# Patient Record
Sex: Male | Born: 1945 | Race: Black or African American | Hispanic: No | Marital: Single | Smoking: Current every day smoker
Health system: Southern US, Community
[De-identification: ages and names within clinical notes are randomized; demographics above are authoritative.]

## PROBLEM LIST (undated history)

## (undated) DIAGNOSIS — E785 Hyperlipidemia, unspecified: Secondary | ICD-10-CM

## (undated) DIAGNOSIS — T4145XA Adverse effect of unspecified anesthetic, initial encounter: Secondary | ICD-10-CM

## (undated) DIAGNOSIS — M6281 Muscle weakness (generalized): Secondary | ICD-10-CM

## (undated) DIAGNOSIS — I4729 Other ventricular tachycardia: Secondary | ICD-10-CM

## (undated) DIAGNOSIS — G709 Myoneural disorder, unspecified: Secondary | ICD-10-CM

## (undated) DIAGNOSIS — I2510 Atherosclerotic heart disease of native coronary artery without angina pectoris: Secondary | ICD-10-CM

## (undated) DIAGNOSIS — I739 Peripheral vascular disease, unspecified: Secondary | ICD-10-CM

## (undated) DIAGNOSIS — I10 Essential (primary) hypertension: Secondary | ICD-10-CM

## (undated) DIAGNOSIS — R293 Abnormal posture: Secondary | ICD-10-CM

## (undated) DIAGNOSIS — J449 Chronic obstructive pulmonary disease, unspecified: Secondary | ICD-10-CM

## (undated) DIAGNOSIS — F329 Major depressive disorder, single episode, unspecified: Secondary | ICD-10-CM

## (undated) DIAGNOSIS — I469 Cardiac arrest, cause unspecified: Secondary | ICD-10-CM

## (undated) DIAGNOSIS — I509 Heart failure, unspecified: Secondary | ICD-10-CM

## (undated) DIAGNOSIS — F32A Depression, unspecified: Secondary | ICD-10-CM

## (undated) DIAGNOSIS — N189 Chronic kidney disease, unspecified: Secondary | ICD-10-CM

## (undated) DIAGNOSIS — I472 Ventricular tachycardia: Secondary | ICD-10-CM

## (undated) DIAGNOSIS — T8859XA Other complications of anesthesia, initial encounter: Secondary | ICD-10-CM

## (undated) DIAGNOSIS — I428 Other cardiomyopathies: Secondary | ICD-10-CM

## (undated) DIAGNOSIS — I5189 Other ill-defined heart diseases: Secondary | ICD-10-CM

## (undated) HISTORY — DX: Other ventricular tachycardia: I47.29

## (undated) HISTORY — DX: Hyperlipidemia, unspecified: E78.5

## (undated) HISTORY — DX: Ventricular tachycardia: I47.2

## (undated) HISTORY — DX: Peripheral vascular disease, unspecified: I73.9

## (undated) HISTORY — DX: Cardiac arrest, cause unspecified: I46.9

## (undated) HISTORY — PX: CARDIAC CATHETERIZATION: SHX172

## (undated) HISTORY — DX: Other cardiomyopathies: I42.8

## (undated) HISTORY — PX: OTHER SURGICAL HISTORY: SHX169

## (undated) HISTORY — PX: TOE AMPUTATION: SHX809

## (undated) HISTORY — DX: Atherosclerotic heart disease of native coronary artery without angina pectoris: I25.10

---
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
## 1997-11-19 ENCOUNTER — Emergency Department (HOSPITAL_COMMUNITY): Admission: EM | Admit: 1997-11-19 | Discharge: 1997-11-19 | Payer: Self-pay | Admitting: Emergency Medicine

## 2005-01-19 ENCOUNTER — Ambulatory Visit: Payer: Self-pay | Admitting: Family Medicine

## 2005-01-19 ENCOUNTER — Observation Stay (HOSPITAL_COMMUNITY): Admission: AD | Admit: 2005-01-19 | Discharge: 2005-01-20 | Payer: Self-pay | Admitting: Family Medicine

## 2005-01-28 ENCOUNTER — Ambulatory Visit: Payer: Self-pay | Admitting: Family Medicine

## 2005-03-06 ENCOUNTER — Ambulatory Visit (HOSPITAL_COMMUNITY): Admission: RE | Admit: 2005-03-06 | Discharge: 2005-03-06 | Payer: Self-pay | Admitting: Urology

## 2005-03-06 ENCOUNTER — Ambulatory Visit (HOSPITAL_BASED_OUTPATIENT_CLINIC_OR_DEPARTMENT_OTHER): Admission: RE | Admit: 2005-03-06 | Discharge: 2005-03-06 | Payer: Self-pay | Admitting: Urology

## 2005-03-27 ENCOUNTER — Ambulatory Visit (HOSPITAL_BASED_OUTPATIENT_CLINIC_OR_DEPARTMENT_OTHER): Admission: RE | Admit: 2005-03-27 | Discharge: 2005-03-27 | Payer: Self-pay | Admitting: Urology

## 2005-03-27 ENCOUNTER — Ambulatory Visit (HOSPITAL_COMMUNITY): Admission: RE | Admit: 2005-03-27 | Discharge: 2005-03-27 | Payer: Self-pay | Admitting: Urology

## 2006-05-27 DIAGNOSIS — F17200 Nicotine dependence, unspecified, uncomplicated: Secondary | ICD-10-CM

## 2011-05-07 ENCOUNTER — Emergency Department (HOSPITAL_COMMUNITY): Payer: Medicare Other

## 2011-05-07 ENCOUNTER — Other Ambulatory Visit: Payer: Self-pay

## 2011-05-07 ENCOUNTER — Inpatient Hospital Stay (HOSPITAL_COMMUNITY)
Admission: EM | Admit: 2011-05-07 | Discharge: 2011-05-14 | DRG: 286 | Disposition: A | Discharge status: Home / Self Care | Payer: Medicare Other | Source: Ambulatory Visit | Attending: Internal Medicine | Admitting: Internal Medicine

## 2011-05-07 ENCOUNTER — Encounter (HOSPITAL_COMMUNITY): Payer: Self-pay | Admitting: Radiology

## 2011-05-07 DIAGNOSIS — I5031 Acute diastolic (congestive) heart failure: Secondary | ICD-10-CM

## 2011-05-07 DIAGNOSIS — F17200 Nicotine dependence, unspecified, uncomplicated: Secondary | ICD-10-CM

## 2011-05-07 DIAGNOSIS — I428 Other cardiomyopathies: Secondary | ICD-10-CM

## 2011-05-07 DIAGNOSIS — E785 Hyperlipidemia, unspecified: Secondary | ICD-10-CM

## 2011-05-07 DIAGNOSIS — Z794 Long term (current) use of insulin: Secondary | ICD-10-CM

## 2011-05-07 DIAGNOSIS — Z7982 Long term (current) use of aspirin: Secondary | ICD-10-CM

## 2011-05-07 DIAGNOSIS — E118 Type 2 diabetes mellitus with unspecified complications: Secondary | ICD-10-CM | POA: Diagnosis present

## 2011-05-07 DIAGNOSIS — E669 Obesity, unspecified: Secondary | ICD-10-CM | POA: Diagnosis present

## 2011-05-07 DIAGNOSIS — E119 Type 2 diabetes mellitus without complications: Secondary | ICD-10-CM | POA: Diagnosis present

## 2011-05-07 DIAGNOSIS — Z79899 Other long term (current) drug therapy: Secondary | ICD-10-CM

## 2011-05-07 DIAGNOSIS — E876 Hypokalemia: Secondary | ICD-10-CM | POA: Diagnosis present

## 2011-05-07 DIAGNOSIS — J811 Chronic pulmonary edema: Secondary | ICD-10-CM

## 2011-05-07 DIAGNOSIS — E1169 Type 2 diabetes mellitus with other specified complication: Secondary | ICD-10-CM | POA: Diagnosis present

## 2011-05-07 DIAGNOSIS — R7402 Elevation of levels of lactic acid dehydrogenase (LDH): Secondary | ICD-10-CM | POA: Diagnosis present

## 2011-05-07 DIAGNOSIS — E1310 Other specified diabetes mellitus with ketoacidosis without coma: Secondary | ICD-10-CM | POA: Diagnosis present

## 2011-05-07 DIAGNOSIS — Z8249 Family history of ischemic heart disease and other diseases of the circulatory system: Secondary | ICD-10-CM

## 2011-05-07 DIAGNOSIS — I110 Hypertensive heart disease with heart failure: Principal | ICD-10-CM | POA: Diagnosis present

## 2011-05-07 DIAGNOSIS — I509 Heart failure, unspecified: Secondary | ICD-10-CM

## 2011-05-07 DIAGNOSIS — I161 Hypertensive emergency: Secondary | ICD-10-CM

## 2011-05-07 DIAGNOSIS — E7800 Pure hypercholesterolemia, unspecified: Secondary | ICD-10-CM | POA: Diagnosis present

## 2011-05-07 DIAGNOSIS — R7401 Elevation of levels of liver transaminase levels: Secondary | ICD-10-CM | POA: Diagnosis present

## 2011-05-07 DIAGNOSIS — I10 Essential (primary) hypertension: Secondary | ICD-10-CM | POA: Diagnosis present

## 2011-05-07 HISTORY — DX: Essential (primary) hypertension: I10

## 2011-05-07 HISTORY — DX: Heart failure, unspecified: I50.9

## 2011-05-07 LAB — CBC
HCT: 41.1 % (ref 39.0–52.0)
HCT: 38.9 % — ABNORMAL LOW (ref 39.0–52.0)
Hemoglobin: 13.4 g/dL (ref 13.0–17.0)
MCH: 29 pg (ref 26.0–34.0)
MCV: 84.4 fL (ref 78.0–100.0)
MCV: 84.2 fL (ref 78.0–100.0)
Platelets: 190 10*3/uL (ref 150–400)
RBC: 4.87 MIL/uL (ref 4.22–5.81)
RBC: 4.62 MIL/uL (ref 4.22–5.81)
RDW: 13.9 % (ref 11.5–15.5)
WBC: 13.7 10*3/uL — ABNORMAL HIGH (ref 4.0–10.5)
WBC: 10.8 10*3/uL — ABNORMAL HIGH (ref 4.0–10.5)

## 2011-05-07 LAB — URINALYSIS, ROUTINE W REFLEX MICROSCOPIC
Bilirubin Urine: NEGATIVE
Leukocytes, UA: NEGATIVE
Nitrite: NEGATIVE
Specific Gravity, Urine: 1.02 (ref 1.005–1.030)
Urobilinogen, UA: 0.2 mg/dL (ref 0.0–1.0)
pH: 5.5 (ref 5.0–8.0)

## 2011-05-07 LAB — D-DIMER, QUANTITATIVE: D-Dimer, Quant: 0.84 ug/mL-FEU — ABNORMAL HIGH (ref 0.00–0.48)

## 2011-05-07 LAB — CARDIAC PANEL(CRET KIN+CKTOT+MB+TROPI)
CK, MB: 5.8 ng/mL — ABNORMAL HIGH (ref 0.3–4.0)
Relative Index: 2.1 (ref 0.0–2.5)
Relative Index: 2.2 (ref 0.0–2.5)
Troponin I: <0.3 ng/mL (ref <0.30)

## 2011-05-07 LAB — COMPREHENSIVE METABOLIC PANEL
ALT: 69 U/L — ABNORMAL HIGH (ref 0–53)
AST: 143 U/L — ABNORMAL HIGH (ref 0–37)
Alkaline Phosphatase: 75 U/L (ref 39–117)
CO2: 20 mEq/L (ref 19–32)
Chloride: 98 mEq/L (ref 96–112)
GFR calc Af Amer: >90 mL/min (ref >90)
GFR calc non Af Amer: >90 mL/min (ref >90)
Glucose, Bld: 412 mg/dL — ABNORMAL HIGH (ref 70–99)
Sodium: 137 mEq/L (ref 135–145)
Total Bilirubin: 0.3 mg/dL (ref 0.3–1.2)

## 2011-05-07 LAB — URINE MICROSCOPIC-ADD ON

## 2011-05-07 LAB — LIPID PANEL
Cholesterol: 227 mg/dL — ABNORMAL HIGH (ref 0–200)
HDL: 32 mg/dL — ABNORMAL LOW (ref >39)
Total CHOL/HDL Ratio: 7.1 RATIO
Triglycerides: 176 mg/dL — ABNORMAL HIGH (ref <150)
VLDL: 35 mg/dL (ref 0–40)

## 2011-05-07 LAB — DIFFERENTIAL
Basophils Absolute: 0 10*3/uL (ref 0.0–0.1)
Eosinophils Relative: 0 % (ref 0–5)
Lymphocytes Relative: 17 % (ref 12–46)
Lymphs Abs: 2.3 10*3/uL (ref 0.7–4.0)
Neutro Abs: 10.7 10*3/uL — ABNORMAL HIGH (ref 1.7–7.7)

## 2011-05-07 LAB — PRO B NATRIURETIC PEPTIDE: Pro B Natriuretic peptide (BNP): 744.7 pg/mL — ABNORMAL HIGH (ref 0–125)

## 2011-05-07 LAB — CREATININE, SERUM: GFR calc Af Amer: >90 mL/min (ref >90)

## 2011-05-07 MED ORDER — INSULIN REGULAR BOLUS VIA INFUSION
0.0000 [IU] | Freq: Three times a day (TID) | INTRAVENOUS | Status: DC
Start: 2011-05-08 — End: 2011-05-08
  Filled 2011-05-07: qty 10

## 2011-05-07 MED ORDER — NICOTINE 21 MG/24HR TD PT24
21.0000 mg | MEDICATED_PATCH | TRANSDERMAL | Status: DC
  Administered 2011-05-07 – 2011-05-13 (×7): 21 mg via TRANSDERMAL
  Filled 2011-05-07 (×8): qty 1

## 2011-05-07 MED ORDER — ASPIRIN 325 MG PO TABS
325.0000 mg | ORAL_TABLET | Freq: Every day | ORAL | Status: DC
Start: 2011-05-08 — End: 2011-05-14
  Administered 2011-05-08 – 2011-05-14 (×7): 325 mg via ORAL
  Filled 2011-05-07 (×7): qty 1

## 2011-05-07 MED ORDER — LEVALBUTEROL HCL 0.63 MG/3ML IN NEBU
0.6300 mg | INHALATION_SOLUTION | Freq: Four times a day (QID) | RESPIRATORY_TRACT | Status: DC
  Filled 2011-05-07 (×3): qty 3

## 2011-05-07 MED ORDER — IOHEXOL 350 MG/ML SOLN
100.0000 mL | Freq: Once | INTRAVENOUS | Status: AC | PRN
  Administered 2011-05-07: 100 mL via INTRAVENOUS

## 2011-05-07 MED ORDER — FUROSEMIDE 10 MG/ML IJ SOLN
40.0000 mg | Freq: Once | INTRAMUSCULAR | Status: AC
  Administered 2011-05-07: 40 mg via INTRAVENOUS
  Filled 2011-05-07: qty 4

## 2011-05-07 MED ORDER — ONDANSETRON HCL 4 MG/2ML IJ SOLN: 4.0000 mg | Freq: Four times a day (QID) | INTRAMUSCULAR | Status: DC | PRN

## 2011-05-07 MED ORDER — LEVALBUTEROL HCL 0.63 MG/3ML IN NEBU
0.6300 mg | INHALATION_SOLUTION | Freq: Two times a day (BID) | RESPIRATORY_TRACT | Status: DC
  Administered 2011-05-08: 0.63 mg via RESPIRATORY_TRACT
  Filled 2011-05-07 (×2): qty 3

## 2011-05-07 MED ORDER — ASPIRIN 81 MG PO CHEW
324.0000 mg | CHEWABLE_TABLET | Freq: Once | ORAL | Status: AC
  Administered 2011-05-07: 324 mg via ORAL
  Filled 2011-05-07: qty 4

## 2011-05-07 MED ORDER — SODIUM CHLORIDE 0.9 % IV SOLN: 250.0000 mL | INTRAVENOUS | Status: DC | PRN

## 2011-05-07 MED ORDER — ENOXAPARIN SODIUM 40 MG/0.4ML ~~LOC~~ SOLN
40.0000 mg | SUBCUTANEOUS | Status: DC
  Administered 2011-05-07: 40 mg via SUBCUTANEOUS
  Filled 2011-05-07 (×2): qty 0.4

## 2011-05-07 MED ORDER — IPRATROPIUM BROMIDE 0.02 % IN SOLN
0.5000 mg | Freq: Four times a day (QID) | RESPIRATORY_TRACT | Status: DC | PRN
  Administered 2011-05-07: 0.5 mg via RESPIRATORY_TRACT
  Filled 2011-05-07: qty 2.5

## 2011-05-07 MED ORDER — IPRATROPIUM BROMIDE 0.02 % IN SOLN
0.5000 mg | Freq: Two times a day (BID) | RESPIRATORY_TRACT | Status: DC
  Administered 2011-05-08: 0.5 mg via RESPIRATORY_TRACT
  Filled 2011-05-07: qty 2.5

## 2011-05-07 MED ORDER — LISINOPRIL 5 MG PO TABS
5.0000 mg | ORAL_TABLET | Freq: Every day | ORAL | Status: DC
  Administered 2011-05-08: 5 mg via ORAL
  Filled 2011-05-07 (×2): qty 1

## 2011-05-07 MED ORDER — DEXTROSE 50 % IV SOLN: 25.0000 mL | INTRAVENOUS | Status: DC | PRN

## 2011-05-07 MED ORDER — NITROGLYCERIN 0.4 MG SL SUBL: 0.4000 mg | SUBLINGUAL_TABLET | SUBLINGUAL | Status: DC | PRN

## 2011-05-07 MED ORDER — MOXIFLOXACIN HCL IN NACL 400 MG/250ML IV SOLN
400.0000 mg | INTRAVENOUS | Status: DC
  Administered 2011-05-07 – 2011-05-08 (×2): 400 mg via INTRAVENOUS
  Filled 2011-05-07 (×2): qty 250

## 2011-05-07 MED ORDER — NITROGLYCERIN IN D5W 200-5 MCG/ML-% IV SOLN: 5.0000 ug/min | INTRAVENOUS | Status: DC

## 2011-05-07 MED ORDER — FUROSEMIDE 10 MG/ML IJ SOLN
40.0000 mg | Freq: Two times a day (BID) | INTRAMUSCULAR | Status: DC
  Administered 2011-05-08 (×2): 40 mg via INTRAVENOUS
  Filled 2011-05-07 (×3): qty 4

## 2011-05-07 MED ORDER — LEVALBUTEROL HCL 0.63 MG/3ML IN NEBU
0.6300 mg | INHALATION_SOLUTION | Freq: Four times a day (QID) | RESPIRATORY_TRACT | Status: DC | PRN
  Administered 2011-05-07: 0.63 mg via RESPIRATORY_TRACT
  Filled 2011-05-07: qty 3

## 2011-05-07 MED ORDER — SODIUM CHLORIDE 0.9 % IV SOLN
INTRAVENOUS | Status: AC
  Administered 2011-05-08: 3.1 [IU]/h via INTRAVENOUS
  Filled 2011-05-07: qty 1

## 2011-05-07 MED ORDER — SODIUM CHLORIDE 0.9 % IJ SOLN
3.0000 mL | Freq: Two times a day (BID) | INTRAMUSCULAR | Status: DC
  Administered 2011-05-07 – 2011-05-14 (×11): 3 mL via INTRAVENOUS

## 2011-05-07 MED ORDER — SODIUM CHLORIDE 0.9 % IV SOLN
INTRAVENOUS | Status: DC
  Administered 2011-05-07 – 2011-05-10 (×2): via INTRAVENOUS

## 2011-05-07 MED ORDER — CARVEDILOL 3.125 MG PO TABS
3.1250 mg | ORAL_TABLET | Freq: Two times a day (BID) | ORAL | Status: DC
  Administered 2011-05-08: 3.125 mg via ORAL
  Filled 2011-05-07 (×3): qty 1

## 2011-05-07 MED ORDER — NITROGLYCERIN IN D5W 200-5 MCG/ML-% IV SOLN
2.0000 ug/min | Freq: Once | INTRAVENOUS | Status: AC
  Administered 2011-05-07: 3 ug/min via INTRAVENOUS
  Filled 2011-05-07: qty 250

## 2011-05-07 MED ORDER — IPRATROPIUM BROMIDE 0.02 % IN SOLN: 0.5000 mg | Freq: Four times a day (QID) | RESPIRATORY_TRACT | Status: DC

## 2011-05-07 MED ORDER — DEXTROSE-NACL 5-0.45 % IV SOLN
INTRAVENOUS | Status: DC
  Administered 2011-05-08: 03:00:00 via INTRAVENOUS

## 2011-05-07 MED ORDER — ACETAMINOPHEN 325 MG PO TABS: 650.0000 mg | ORAL_TABLET | ORAL | Status: DC | PRN

## 2011-05-07 MED ORDER — POTASSIUM CHLORIDE CRYS ER 20 MEQ PO TBCR
40.0000 meq | EXTENDED_RELEASE_TABLET | Freq: Once | ORAL | Status: AC
  Administered 2011-05-07: 40 meq via ORAL
  Filled 2011-05-07: qty 2

## 2011-05-07 MED ORDER — SODIUM CHLORIDE 0.9 % IJ SOLN: 3.0000 mL | INTRAMUSCULAR | Status: DC | PRN

## 2011-05-07 NOTE — ED Notes (Signed)
Nitro started then stopped again fro lower bp.  Will start again and observe

## 2011-05-07 NOTE — ED Notes (Signed)
The pt has returned from xray no chest pain no sob.  C/o being hungry

## 2011-05-07 NOTE — ED Notes (Signed)
The pt has no chest pain no sob.  He has a cough that he says he has had for awhile

## 2011-05-07 NOTE — ED Provider Notes (Signed)
History     CSN: [id number]  Arrival date & time 05/07/11  1634   First MD Initiated Contact with Patient 05/07/11 1641      Chief Complaint  Patient presents with  . Shortness of Breath  . Tachycardia    (Consider location/radiation/quality/duration/timing/severity/associated sxs/prior treatment) HPI Comments: 66 yo male with sudden onset SOB while walking back from the bathroom.  Denies medical problems, has not seen a doctor in >5 years.  At onset, felt tightness across his abdomen.  None now.  Cold air helped.  O2 helps.  No treatment prior to arrival.    Patient is a 66 y.o. male presenting with shortness of breath. The history is provided by the patient.  Shortness of Breath  The current episode started today. The onset was sudden. The problem occurs continuously. The problem has been unchanged. The problem is severe. The symptoms are relieved by cold air and rest. The symptoms are aggravated by activity. Associated symptoms include cough and shortness of breath. Pertinent negatives include no chest pain and no fever.    No past medical history on file.  No past surgical history on file.  No family history on file.  History  Substance Use Topics  . Smoking status: Not on file  . Smokeless tobacco: Not on file  . Alcohol Use: Not on file      Review of Systems  Constitutional: Negative for fever.  HENT: Positive for congestion.   Respiratory: Positive for cough and shortness of breath.   Cardiovascular: Negative for chest pain.  Gastrointestinal: Negative for nausea, vomiting, abdominal pain and diarrhea.  Skin: Negative for rash.  All other systems reviewed and are negative.    Allergies  Review of patient's allergies indicates no known allergies.  Home Medications  No current outpatient prescriptions on file.  BP 207/126  Pulse 110  Temp(Src) 97.6 F (36.4 C) (Oral)  Resp 25  Ht 5\' 11"  (1.803 m)  Wt 260 lb (117.935 kg)  BMI 36.26 kg/m2  SpO2  94%  Physical Exam  Nursing note and vitals reviewed. Constitutional: He is oriented to person, place, and time. He appears well-developed and well-nourished. No distress.  HENT:  Head: Normocephalic and atraumatic.  Mouth/Throat: Oropharynx is clear and moist.  Eyes: Conjunctivae are normal. Pupils are equal, round, and reactive to light. No scleral icterus.  Neck: Normal range of motion. Neck supple.  Cardiovascular: Normal rate, regular rhythm, normal heart sounds and intact distal pulses.   No murmur heard. Pulmonary/Chest: No stridor.       Course breath sounds in all lung fields.  Able to talk in full sentences.  Tachypneic. Mild respiratory distress.    Abdominal: Soft. He exhibits no distension. There is no tenderness.  Musculoskeletal: Normal range of motion. He exhibits edema (mild lower extremity edema).  Neurological: He is alert and oriented to person, place, and time.  Skin: Skin is warm and dry. No rash noted.  Psychiatric: He has a normal mood and affect. His behavior is normal.    ED Course  Procedures (including critical care time)  Labs Reviewed  CBC - Abnormal; Notable for the following:    WBC 13.7 (*)    All other components within normal limits  DIFFERENTIAL - Abnormal; Notable for the following:    Neutrophils Relative 78 (*)    Neutro Abs 10.7 (*)    All other components within normal limits  COMPREHENSIVE METABOLIC PANEL - Abnormal; Notable for the following:    Potassium  3.4 (*)    Glucose, Bld 412 (*)    AST 143 (*)    ALT 69 (*)    All other components within normal limits  CARDIAC PANEL(CRET KIN+CKTOT+MB+TROPI) - Abnormal; Notable for the following:    Total CK 235 (*)    CK, MB 4.9 (*)    All other components within normal limits  D-DIMER, QUANTITATIVE - Abnormal; Notable for the following:    D-Dimer, Quant 0.84 (*)    All other components within normal limits  URINALYSIS, ROUTINE W REFLEX MICROSCOPIC - Abnormal; Notable for the following:     Glucose, UA >1000 (*)    Hgb urine dipstick TRACE (*)    Protein, ur 100 (*)    All other components within normal limits  PRO B NATRIURETIC PEPTIDE - Abnormal; Notable for the following:    Pro B Natriuretic peptide (BNP) 744.7 (*)    All other components within normal limits  URINE MICROSCOPIC-ADD ON - Abnormal; Notable for the following:    Casts HYALINE CASTS (*)    All other components within normal limits  CARDIAC PANEL(CRET KIN+CKTOT+MB+TROPI) - Abnormal; Notable for the following:    Total CK 262 (*)    CK, MB 5.8 (*)    All other components within normal limits  CBC - Abnormal; Notable for the following:    WBC 10.8 (*)    HCT 38.9 (*)    All other components within normal limits  CREATININE, SERUM - Abnormal; Notable for the following:    GFR calc non Af Amer 88 (*)    All other components within normal limits  LIPID PANEL - Abnormal; Notable for the following:    Cholesterol 227 (*)    Triglycerides 176 (*)    HDL 32 (*)    LDL Cholesterol 160 (*)    All other components within normal limits  GLUCOSE, CAPILLARY - Abnormal; Notable for the following:    Glucose-Capillary 372 (*)    All other components within normal limits  PROTIME-INR  MRSA PCR SCREENING  KETONES, URINE  CARDIAC PANEL(CRET KIN+CKTOT+MB+TROPI)  CARDIAC PANEL(CRET KIN+CKTOT+MB+TROPI)  BASIC METABOLIC PANEL  URINE RAPID DRUG SCREEN (HOSP PERFORMED)   Ct Angio Chest W/cm &/or Wo Cm  05/07/2011  *RADIOLOGY REPORT*  Clinical Data: 66 year old male with shortness of breath. Tachycardia.  CT ANGIOGRAPHY CHEST  Technique:  Multidetector CT imaging of the chest using the standard protocol during bolus administration of intravenous contrast. Multiplanar reconstructed images including MIPs were obtained and reviewed to evaluate the vascular anatomy.  Contrast: OMNIPAQUE IOHEXOL 350 MG/ML IV SOLN  Comparison: Chest radiographs 05/07/2011.  Findings: Adequate contrast bolus timing in the pulmonary arterial  tree.  Respiratory motion artifact in the lower lungs. No focal filling defect identified in the pulmonary arterial tree to suggest the presence of acute pulmonary embolism.  Atelectatic changes to the major airways.  Layering bilateral pleural effusions, small and greater on the right.  No pericardial effusion.  Pulmonary atelectasis.  Ground-glass opacity in the superior segment of the left lower lobe and the lateral aspect of the superior right lower lobes segment.  Nodular bronchovascular opacity in the lingula measuring 9 mm (image 49).  Negative visualized upper abdominal viscera.  Increased number of mediastinal nodes.  Abnormally enlarged right paratracheal node of the 15 mm in short axis.  Small hilar nodes may be increased in number.  Negative thoracic inlet.  No axillary lymphadenopathy.  No acute osseous abnormality identified.  IMPRESSION: 1. No evidence of  acute pulmonary embolus. 2.  Small bilateral pleural effusions with atelectasis and superimposed bilateral superior segment lower lobe patchy/ground- glass opacities which presumably represent infection. 3.  Nodular more solid indeterminate opacity in the lingula measuring 9 mm (series 5 image 49).  Recommend post-treatment follow-up chest CT once the acute pulmonary process has clinically resolved to document resolution. 4.  Mediastinal lymphadenopathy favor to be reactive.  Attention directed on follow-up.  Original Report Authenticated By: Harley Hallmark, M.D.   Dg Chest Portable 1 View  05/07/2011  *RADIOLOGY REPORT*  Clinical Data: Shortness of breath, weakness, tachycardia  PORTABLE CHEST - 1 VIEW  Comparison: None.  Findings: There is airspace disease primarily at the lung bases although there is some  indistinctness of the perihilar vasculature as well.  These findings may represent pneumonia, but pulmonary vascular congestion and mild edema cannot be excluded.  Follow-up two-view chest x-ray is recommended.  No definite effusion is seen.  The heart is mildly enlarged.  IMPRESSION: Airspace disease and some perihilar vascular prominence.  Consider pneumonia or possibly pulmonary vascular congestion and edema. Recommend two-view chest x-ray.  Original Report Authenticated By: Juline Patch, M.D.   All radiology studies independently viewed by me.       Date: 05/08/2011  Rate: 109  Rhythm: sinus tachycardia  QRS Axis: normal  Intervals: normal  ST/T Wave abnormalities: normal  Conduction Disutrbances:none  Narrative Interpretation: PVC present  Old EKG Reviewed: none available    1. Hypertensive emergency   2. Pulmonary edema       MDM  Sudden onset shortness of breath.   No chest pain.  Hypertensive on arrival and tachypneic.  Course breath sounds bilaterally.  Initial impression is pulmonary edema.  Nitro drip ordered.  Labs and CXR also ordered.    Imaging with evidence of pulmonary edema.  Nitro drip has improved blood pressure.  Lasix given.  Internal medicine consulted for admission.      Warnell Forester, MD 05/08/11 [phone]

## 2011-05-07 NOTE — ED Notes (Signed)
Admitting doctor here to see 

## 2011-05-07 NOTE — H&P (Signed)
History and Physical       Hospital Admission Note Date: 05/07/2011  Patient name: Miguel Harvey Medical record number: [id number] Date of birth: 12-06-1945 Age: 66 y.o. Gender: male PCP: Event organiser, MD, MD  Attending physician: Marlin Canary, DO   Chief Complaint:  Acute shortness of breath today  HPI: Patient is a 66 year old African American male with history of tobacco abuse, who had not seen a physician in last 6 years presented to [facility] emergency room with acute shortness of breath with sudden onset today. History was obtained from the patient who stated that he was in his usual state of health uptill today afternoon. He took a nap between 1:30 to 3:30 PM, when he woke up to use the bathroom, he was walking back when he felt very short of breath and tightness across his lower abdomen. He denied any chest pain, diaphoresis, dizziness or lightheadedness or any syncopal episode. He went outside the house and felt somewhat better with the cold air. Patient states that he has not seen a physician in the last 6 years and was not taking any medications either. The emergency room workup showed elevated blood pressure 207/126, chest x-ray with pulmonary edema, BNP of 744. Patient was also noted to have leukocytosis with WBC of 13.7, and blood glucose of 412 with anion gap of 19. Patient denies any known history of diabetes mellitus or CHF.  Review of Systems:  Constitutional: Denies fever, chills, diaphoresis, appetite change and fatigue.  HEENT: Denies photophobia, eye pain, redness, hearing loss, ear pain, congestion, sore throat, rhinorrhea, sneezing, mouth sores, trouble swallowing, neck pain, neck stiffness and tinnitus.   Respiratory:  see history of present illness Cardiovascular: See history of present illness  Gastrointestinal: Denies nausea, vomiting, abdominal pain, diarrhea, constipation, blood in stool and abdominal  distention.  Genitourinary: Denies dysuria, urgency, frequency, hematuria, flank pain and difficulty urinating.  Musculoskeletal: Denies myalgias, back pain, joint swelling, arthralgias and gait problem.  Skin: Denies pallor, rash and wound.  Neurological: Denies dizziness, seizures, syncope, weakness, light-headedness, numbness and headaches.  Hematological: Denies adenopathy. Easy bruising, personal or family bleeding history  Psychiatric/Behavioral: Denies suicidal ideation, mood changes, confusion, nervousness, sleep disturbance and agitation  Past Medical History: History reviewed. No pertinent past medical history. No past surgical history on file.  Medications: Prior to Admission medications   Not on File    Allergies:  No Known Allergies  Social History: Patient smokes one pack per day for last 50 years, drinks a beer now and then and denies any drug use.   Family History: No family history on file.  Physical Exam: Blood pressure 159/63, pulse 63, temperature 97.6 F (36.4 C), temperature source Oral, resp. rate 18, height 5\' 11"  (1.803 m), weight 117.935 kg (260 lb), SpO2 100.00%. General: Alert, awake, oriented x3, in no acute distress. HEENT: anicteric sclera, pink conjunctiva, pupils equal and reactive to light and accomodation Neck: supple, no masses or lymphadenopathy, no goiter, no bruits  Heart: Regular rate and rhythm, without murmurs, rubs or gallops. Lungs: Coarse breath sounds throughout Abdomen: Soft, nontender, nondistended, positive bowel sounds, no masses. Extremities: No clubbing, cyanosis or edema with positive pedal pulses. Neuro: Grossly intact, no focal neurological deficits, strength 5/5 upper and lower extremities bilaterally Psych: alert and oriented x 3, normal mood and affect Skin: no rashes or lesions, warm and dry   LABS on Admission:  Basic Metabolic Panel:  Lab 05/07/11 4098  NA 137  K 3.4*  CL 98  CO2 20  GLUCOSE 412*  BUN 10    CREATININE 0.77  CALCIUM 8.8  MG --  PHOS --   Liver Function Tests:  Lab 05/07/11 1656  AST 143*  ALT 69*  ALKPHOS 75  BILITOT 0.3  PROT 6.8  ALBUMIN 3.6     Lab 05/07/11 1656  WBC 13.7*  NEUTROABS 10.7*  HGB 14.1  HCT 41.1  MCV 84.4  PLT 190   Cardiac Enzymes:  Lab 05/07/11 1713  CKTOTAL 235*  CKMB 4.9*  CKMBINDEX --  TROPONINI <0.30    Radiological Exams on Admission: Ct Angio Chest W/cm &/or Wo Cm  05/07/2011  *RADIOLOGY REPORT*  Clinical Data: 66 year old male with shortness of breath. Tachycardia.  CT ANGIOGRAPHY CHEST  Technique:  Multidetector CT imaging of the chest using the standard protocol during bolus administration of intravenous contrast. Multiplanar reconstructed images including MIPs were obtained and reviewed to evaluate the vascular anatomy.  Contrast: OMNIPAQUE IOHEXOL 350 MG/ML IV SOLN  Comparison: Chest radiographs 05/07/2011.  Findings: Adequate contrast bolus timing in the pulmonary arterial tree.  Respiratory motion artifact in the lower lungs. No focal filling defect identified in the pulmonary arterial tree to suggest the presence of acute pulmonary embolism.  Atelectatic changes to the major airways.  Layering bilateral pleural effusions, small and greater on the right.  No pericardial effusion.  Pulmonary atelectasis.  Ground-glass opacity in the superior segment of the left lower lobe and the lateral aspect of the superior right lower lobes segment.  Nodular bronchovascular opacity in the lingula measuring 9 mm (image 49).  Negative visualized upper abdominal viscera.  Increased number of mediastinal nodes.  Abnormally enlarged right paratracheal node of the 15 mm in short axis.  Small hilar nodes may be increased in number.  Negative thoracic inlet.  No axillary lymphadenopathy.  No acute osseous abnormality identified.  IMPRESSION: 1. No evidence of acute pulmonary embolus. 2.  Small bilateral pleural effusions with atelectasis and  superimposed bilateral superior segment lower lobe patchy/ground- glass opacities which presumably represent infection. 3.  Nodular more solid indeterminate opacity in the lingula measuring 9 mm (series 5 image 49).  Recommend post-treatment follow-up chest CT once the acute pulmonary process has clinically resolved to document resolution. 4.  Mediastinal lymphadenopathy favor to be reactive.  Attention directed on follow-up.  Original Report Authenticated By: Harley Hallmark, M.D.   Dg Chest Portable 1 View  05/07/2011  *RADIOLOGY REPORT*  Clinical Data: Shortness of breath, weakness, tachycardia  PORTABLE CHEST - 1 VIEW  Comparison: None.  Findings: There is airspace disease primarily at the lung bases although there is some  indistinctness of the perihilar vasculature as well.  These findings may represent pneumonia, but pulmonary vascular congestion and mild edema cannot be excluded.  Follow-up two-view chest x-ray is recommended.  No definite effusion is seen. The heart is mildly enlarged.  IMPRESSION: Airspace disease and some perihilar vascular prominence.  Consider pneumonia or possibly pulmonary vascular congestion and edema. Recommend two-view chest x-ray.  Original Report Authenticated By: Juline Patch, M.D.    Assessment/Plan  Principal problem: .Pulmonary edema: Differential diagnosis includes acute flash pulmonary edema due to malignant hypertension causing the acute CHF, acute bronchitis with atypical pneumonia. CT angiogram of the chest was done which ruled out pulmonary embolism - Patient will be admitted to step down unit overnight, given he is started on nitroglycerin drip for malignant hypertension and acute CHF, continue Lasix IV - Obtain serial cardiac enzymes, TSH, IV Lasix,  started on aspirin, Coreg, ACEI, wean nitro drip to off - Obtain lipid panel, 2-D echocardiogram for further workup. Obtain urine drug screen. - Patient does have coarse breath sounds on lung exam, start on  Avelox, Xopenex/Atrovent nebulizer treatments. Will order 2 view chest x-ray for tomorrow AM  Active problems:  .Diabetes mellitus: New onset  - Obtain HbA1c, anion gap 19, obtain urine ketones to assess for DKA, I will start the patient on glucose stabilizer for now. Patient will likely need diabetic teaching and education. Given patient has acute pulmonary edema with elevated BNP, hold off on IV fluids. - Patient will need to have a PCP and case manager assistance with medications.  Marland KitchenHTN (hypertension), malignant - Continue nitro drip, wean to off when SBP<140's/DBP<100's, I started the patient on Coreg, ACEI, aspirin   .Tobacco abuse: - Strongly counseled against nicotine use, I started him on nicotine patches   DVT prophylaxis: Lovenox  CODE STATUS: Full CODE STATUS  Further plan will depend as patient's clinical course evolves and further radiologic and laboratory data become available.   @Time  Spent on Admission: 1 hour Rumaisa Schnetzer M.D. [facility] 05/07/2011, 10:09 PM

## 2011-05-07 NOTE — ED Notes (Signed)
Per [facility], pt c/o sob since 1530 today while ambulating in his home.  Lasted 30 minutes before calling EMS.  Started with sob and tightness around abdomen.  No prior hx of same.  Denies any pain at this time.

## 2011-05-07 NOTE — ED Notes (Signed)
No chest pain no sob. Lasix 40mg  given iv

## 2011-05-08 ENCOUNTER — Encounter (HOSPITAL_COMMUNITY): Payer: Self-pay | Admitting: *Deleted

## 2011-05-08 ENCOUNTER — Other Ambulatory Visit: Payer: Self-pay

## 2011-05-08 ENCOUNTER — Inpatient Hospital Stay (HOSPITAL_COMMUNITY): Payer: Medicare Other

## 2011-05-08 DIAGNOSIS — I369 Nonrheumatic tricuspid valve disorder, unspecified: Secondary | ICD-10-CM

## 2011-05-08 LAB — CARDIAC PANEL(CRET KIN+CKTOT+MB+TROPI)
CK, MB: 5.9 ng/mL — ABNORMAL HIGH (ref 0.3–4.0)
CK, MB: 4.7 ng/mL — ABNORMAL HIGH (ref 0.3–4.0)
Relative Index: 1.6 (ref 0.0–2.5)
Total CK: 314 U/L — ABNORMAL HIGH (ref 7–232)
Total CK: 27 U/L (ref 7–232)
Troponin I: <0.3 ng/mL (ref <0.30)
Troponin I: <0.3 ng/mL (ref <0.30)

## 2011-05-08 LAB — BASIC METABOLIC PANEL
CO2: 25 mEq/L (ref 19–32)
Calcium: 8.8 mg/dL (ref 8.4–10.5)
Chloride: 104 mEq/L (ref 96–112)
Glucose, Bld: 124 mg/dL — ABNORMAL HIGH (ref 70–99)
Potassium: 3.4 mEq/L — ABNORMAL LOW (ref 3.5–5.1)
Sodium: 141 mEq/L (ref 135–145)

## 2011-05-08 LAB — RAPID URINE DRUG SCREEN, HOSP PERFORMED
Barbiturates: NOT DETECTED
Tetrahydrocannabinol: NOT DETECTED

## 2011-05-08 LAB — GLUCOSE, CAPILLARY
Glucose-Capillary: 372 mg/dL — ABNORMAL HIGH (ref 70–99)
Glucose-Capillary: 306 mg/dL — ABNORMAL HIGH (ref 70–99)
Glucose-Capillary: 163 mg/dL — ABNORMAL HIGH (ref 70–99)
Glucose-Capillary: 119 mg/dL — ABNORMAL HIGH (ref 70–99)
Glucose-Capillary: 133 mg/dL — ABNORMAL HIGH (ref 70–99)
Glucose-Capillary: 213 mg/dL — ABNORMAL HIGH (ref 70–99)
Glucose-Capillary: 160 mg/dL — ABNORMAL HIGH (ref 70–99)
Glucose-Capillary: 269 mg/dL — ABNORMAL HIGH (ref 70–99)
Glucose-Capillary: 241 mg/dL — ABNORMAL HIGH (ref 70–99)
Glucose-Capillary: 117 mg/dL — ABNORMAL HIGH (ref 70–99)

## 2011-05-08 MED ORDER — HEPARIN BOLUS VIA INFUSION
3000.0000 [IU] | Freq: Once | INTRAVENOUS | Status: AC
  Administered 2011-05-08: 3000 [IU] via INTRAVENOUS

## 2011-05-08 MED ORDER — NITROGLYCERIN IN D5W 200-5 MCG/ML-% IV SOLN
5.0000 ug/min | INTRAVENOUS | Status: DC
  Administered 2011-05-08: 5 ug/min via INTRAVENOUS
  Filled 2011-05-08: qty 250

## 2011-05-08 MED ORDER — HEPARIN SOD (PORCINE) IN D5W 100 UNIT/ML IV SOLN
1500.0000 [IU]/h | INTRAVENOUS | Status: DC
Start: 2011-05-08 — End: 2011-05-08
  Administered 2011-05-08: 1500 [IU]/h via INTRAVENOUS
  Filled 2011-05-08 (×3): qty 250

## 2011-05-08 MED ORDER — HEPARIN SOD (PORCINE) IN D5W 100 UNIT/ML IV SOLN
1850.0000 [IU]/h | INTRAVENOUS | Status: DC
  Administered 2011-05-08 – 2011-05-10 (×4): 1850 [IU]/h via INTRAVENOUS
  Filled 2011-05-08 (×5): qty 250

## 2011-05-08 MED ORDER — ENOXAPARIN SODIUM 80 MG/0.8ML ~~LOC~~ SOLN: 1.0000 mg/kg | Freq: Two times a day (BID) | SUBCUTANEOUS | Status: DC

## 2011-05-08 MED ORDER — ENOXAPARIN SODIUM 120 MG/0.8ML ~~LOC~~ SOLN
120.0000 mg | Freq: Two times a day (BID) | SUBCUTANEOUS | Status: DC
  Filled 2011-05-08 (×2): qty 0.8

## 2011-05-08 MED ORDER — LIVING WELL WITH DIABETES BOOK
Freq: Once | Status: AC
  Administered 2011-05-08: 12:00:00
  Filled 2011-05-08: qty 1

## 2011-05-08 MED ORDER — CARVEDILOL 6.25 MG PO TABS
6.2500 mg | ORAL_TABLET | Freq: Two times a day (BID) | ORAL | Status: DC
  Administered 2011-05-08: 6.25 mg via ORAL
  Filled 2011-05-08 (×4): qty 1

## 2011-05-08 MED ORDER — INSULIN ASPART 100 UNIT/ML ~~LOC~~ SOLN
0.0000 [IU] | Freq: Three times a day (TID) | SUBCUTANEOUS | Status: DC
  Administered 2011-05-08 – 2011-05-09 (×2): 5 [IU] via SUBCUTANEOUS
  Administered 2011-05-09: 2 [IU] via SUBCUTANEOUS
  Administered 2011-05-09 – 2011-05-10 (×3): 8 [IU] via SUBCUTANEOUS
  Administered 2011-05-10: 5 [IU] via SUBCUTANEOUS
  Administered 2011-05-10: 3 [IU] via SUBCUTANEOUS
  Administered 2011-05-11: 5 [IU] via SUBCUTANEOUS
  Administered 2011-05-11: 3 [IU] via SUBCUTANEOUS
  Administered 2011-05-12: 5 [IU] via SUBCUTANEOUS
  Administered 2011-05-12 (×2): 3 [IU] via SUBCUTANEOUS
  Administered 2011-05-13: 2 [IU] via SUBCUTANEOUS
  Administered 2011-05-14: 3 [IU] via SUBCUTANEOUS
  Administered 2011-05-14: 2 [IU] via SUBCUTANEOUS
  Filled 2011-05-08: qty 3

## 2011-05-08 MED ORDER — PARICALCITOL 5 MCG/ML IV SOLN
INTRAVENOUS | Status: AC
  Filled 2011-05-08: qty 1

## 2011-05-08 MED ORDER — DARBEPOETIN ALFA-POLYSORBATE 40 MCG/0.4ML IJ SOLN
INTRAMUSCULAR | Status: AC
  Filled 2011-05-08: qty 0.4

## 2011-05-08 MED ORDER — INSULIN GLARGINE 100 UNIT/ML ~~LOC~~ SOLN
14.0000 [IU] | SUBCUTANEOUS | Status: AC
  Administered 2011-05-08: 14 [IU] via SUBCUTANEOUS
  Filled 2011-05-08: qty 3

## 2011-05-08 MED ORDER — CARVEDILOL 6.25 MG PO TABS
6.2500 mg | ORAL_TABLET | Freq: Two times a day (BID) | ORAL | Status: DC
  Filled 2011-05-08 (×2): qty 1

## 2011-05-08 MED ORDER — INSULIN ASPART 100 UNIT/ML ~~LOC~~ SOLN
3.0000 [IU] | Freq: Three times a day (TID) | SUBCUTANEOUS | Status: DC
  Administered 2011-05-08 – 2011-05-09 (×3): 3 [IU] via SUBCUTANEOUS

## 2011-05-08 MED ORDER — INSULIN GLARGINE 100 UNIT/ML ~~LOC~~ SOLN: 15.0000 [IU] | Freq: Every day | SUBCUTANEOUS | Status: DC

## 2011-05-08 MED ORDER — INSULIN ASPART 100 UNIT/ML ~~LOC~~ SOLN
0.0000 [IU] | Freq: Every day | SUBCUTANEOUS | Status: DC
  Administered 2011-05-08 – 2011-05-10 (×2): 2 [IU] via SUBCUTANEOUS

## 2011-05-08 MED ORDER — FUROSEMIDE 20 MG PO TABS
20.0000 mg | ORAL_TABLET | Freq: Two times a day (BID) | ORAL | Status: DC
  Administered 2011-05-08 – 2011-05-11 (×6): 20 mg via ORAL
  Filled 2011-05-08 (×8): qty 1

## 2011-05-08 NOTE — Progress Notes (Signed)
Physical Therapy Evaluation Patient Details Name: Miguel Harvey MRN: [id number] DOB: [birth year] Today's Date: 05/08/2011  Problem List:  Patient Active Problem List  Diagnoses  . TOBACCO DEPENDENCE  . Diabetes mellitus  . Pulmonary edema  . Pneumonia  . HTN (hypertension), malignant  . Tobacco abuse  . CHF, acute    Past Medical History:  Past Medical History  Diagnosis Date  . Hypertension   . Diabetes mellitus   . CHF (congestive heart failure)    Past Surgical History:  Past Surgical History  Procedure Date  . Cyst removal from  right leg about 6 years ago     PT Assessment/Plan/Recommendation PT Assessment Clinical Impression Statement: pt is a 66 y/o male admitted with severe SOB due to ? flash pulm edema, bronchitis.  Pt much improved on eval, but still with ongoing decr. activity tolerance.  Pt can benefit from acute PT for education and activity to improve conditioning.  Likely no follow up. PT Recommendation/Assessment: Patient will need skilled PT in the acute care venue PT Problem List: Decreased activity tolerance;Cardiopulmonary status limiting activity PT Therapy Diagnosis : Other (comment) (decr activity tolerance) PT Plan PT Frequency: Min 3X/week PT Treatment/Interventions: Functional mobility training;Therapeutic activities;Gait training;Patient/family education PT Recommendation Follow Up Recommendations: No PT follow up Equipment Recommended: None recommended by PT PT Goals  Acute Rehab PT Goals PT Goal Formulation: With patient Time For Goal Achievement: 7 days Pt will Ambulate: >150 feet;Independently;Other (comment) (with no dyspnea.) PT Goal: Ambulate - Progress: Goal set today Pt will Go Up / Down Stairs: 3-5 stairs;with modified independence  PT Evaluation Precautions/Restrictions    Prior Functioning  Home Living Lives With: Alone Type of Home: House Home Layout: One level;Able to live on main level with bedroom/bathroom Home Access:  Stairs to enter Entrance Stairs-Rails: Right;Left Entrance Stairs-Number of Steps: 4 Bathroom Shower/Tub: Engineer, manufacturing systems: Standard Bathroom Accessibility: Yes Home Adaptive Equipment: None Prior Function Level of Independence: Independent with basic ADLs;Independent with homemaking with ambulation;Independent with gait;Independent with transfers Able to Take Stairs?: Yes Driving: Yes Cognition Cognition Arousal/Alertness: Awake/alert Overall Cognitive Status: Appears within functional limits for tasks assessed Sensation/Coordination Sensation Light Touch: Appears Intact Coordination Gross Motor Movements are Fluid and Coordinated: Yes Fine Motor Movements are Fluid and Coordinated: Not tested Extremity Assessment RUE Assessment RUE Assessment: Within Functional Limits LUE Assessment LUE Assessment: Within Functional Limits RLE Assessment RLE Assessment: Within Functional Limits LLE Assessment LLE Assessment: Within Functional Limits Mobility (including Balance) Bed Mobility Bed Mobility: Yes Supine to Sit: 7: Independent Sitting - Scoot to Edge of Bed: 7: Independent Transfers Transfers: Yes Sit to Stand: 7: Independent Stand to Sit: 7: Independent Ambulation/Gait Ambulation/Gait: Yes Ambulation/Gait Assistance: 7: Independent Ambulation Distance (Feet): 350 Feet Assistive device: None Gait Pattern: Within Functional Limits  Posture/Postural Control Posture/Postural Control: No significant limitations Balance Balance Assessed:  (wfl) Exercise    End of Session PT - End of Session Activity Tolerance: Patient tolerated treatment well;Other (comment) (mild dyspnea) Patient left: in chair;with call bell in reach Nurse Communication: Mobility status for transfers;Mobility status for ambulation General Behavior During Session: [facility] for tasks performed Cognition: [facility] for tasks performed  Lorelee Mclaurin, Eliseo Gum 05/08/2011, 10:54 AM  05/08/2011  [place] Bing, PT [phone] (pager)

## 2011-05-08 NOTE — Progress Notes (Signed)
ANTICOAGULATION CONSULT NOTE - Initial Consult  Pharmacy Consult for heparin Indication: ACS/STEMI  No Known Allergies  Patient Measurements: Height: 5\' 11"  (180.3 cm) Weight: 262 lb 12.6 oz (119.2 kg) IBW/kg (Calculated) : 75.3  Heparin Dosing Weight: 100kg  Vital Signs: Temp: 97.4 F (36.3 C) (02/08 1155) Temp src: Oral (02/08 1155) BP: 141/65 mmHg (02/08 1200) Pulse Rate: 79  (02/08 1155)  Labs:  Basename 05/08/11 1035 05/08/11 0415 05/07/11 2233 05/07/11 1656  HGB -- -- 13.4 14.1  HCT -- -- 38.9* 41.1  PLT -- -- 207 190  APTT -- -- -- --  LABPROT -- -- -- 13.3  INR -- -- -- 0.99  HEPARINUNFRC -- -- -- --  CREATININE -- 0.80 0.86 0.77  CKTOTAL 27 314* 262* --  CKMB 7.0* 5.9* 5.8* --  TROPONINI 4.08* <0.30 <0.30 --   Estimated Creatinine Clearance: 119.4 ml/min (by C-G formula based on Cr of 0.8).  Medical History: Past Medical History  Diagnosis Date  . Hypertension   . Diabetes mellitus   . CHF (congestive heart failure)     Medications:  Scheduled:    . aspirin  324 mg Oral Once  . aspirin  325 mg Oral Daily  . carvedilol  6.25 mg Oral BID WC  . furosemide  40 mg Intravenous Once  . furosemide  40 mg Intravenous Q12H  . insulin aspart  0-15 Units Subcutaneous TID WC  . insulin aspart  0-5 Units Subcutaneous QHS  . insulin aspart  3 Units Subcutaneous TID WC  . insulin glargine  14 Units Subcutaneous NOW  . insulin glargine  15 Units Subcutaneous QHS  . lisinopril  5 mg Oral Daily  . living well with diabetes book   Does not apply Once  . moxifloxacin  400 mg Intravenous Q24H  . nicotine  21 mg Transdermal Q24H  . nitroGLYCERIN  2-200 mcg/min Intravenous Once  . potassium chloride  40 mEq Oral Once  . sodium chloride  3 mL Intravenous Q12H  . DISCONTD: carvedilol  3.125 mg Oral BID WC  . DISCONTD: enoxaparin  1 mg/kg Subcutaneous Q12H  . DISCONTD: enoxaparin (LOVENOX) injection  120 mg Subcutaneous Q12H  . DISCONTD: enoxaparin  40 mg  Subcutaneous Q24H  . DISCONTD: insulin regular  0-10 Units Intravenous TID WC  . DISCONTD: ipratropium  0.5 mg Nebulization Q6H  . DISCONTD: ipratropium  0.5 mg Nebulization BID  . DISCONTD: levalbuterol  0.63 mg Nebulization Q6H  . DISCONTD: levalbuterol  0.63 mg Nebulization BID   Infusions:    . sodium chloride 10 mL/hr at 05/08/11 0800  . insulin (NOVOLIN-R) infusion 1.7 Units/hr (05/08/11 0800)  . nitroGLYCERIN 5 mcg/min (05/08/11 1222)  . DISCONTD: dextrose 5 % and 0.45% NaCl 20 mL/hr at 05/08/11 0800  . DISCONTD: nitroGLYCERIN 15 mcg/min (05/08/11 0910)    Assessment: 66 yo male with ACS/STEMI will be started on heparin therapy.  CBC ok. Got lovenox 40mg  sq last night at 2300. Goal of Therapy:  Heparin level 0.3-0.7 units/ml   Plan:  1) Heparin 1500 units/hr (=82ml/hr). No bolus.  2) Check a 6h heprain level after drip is started. 3) Daily heparin level and CBC  Natavia Sublette, Tsz-Yin 05/08/2011,12:57 PM

## 2011-05-08 NOTE — ED Provider Notes (Signed)
I saw and evaluated the patient, reviewed the resident's note and I agree with the findings and plan.  Acute onset SOB with "tightness" in lower abdomen.  No chest pain.  Hypertensive and tachypneic with rales.  Suspect pulmonary edema from CHF, hypertensive urgency.  Also hyperglycemia, r/o DKA.  NO history of diabetes.  CRITICAL CARE Performed by: Glynn Octave   Total critical care time: 30  Critical care time was exclusive of separately billable procedures and treating other patients.  Critical care was necessary to treat or prevent imminent or life-threatening deterioration.  Critical care was time spent personally by me on the following activities: development of treatment plan with patient and/or surrogate as well as nursing, discussions with consultants, evaluation of patient's response to treatment, examination of patient, obtaining history from patient or surrogate, ordering and performing treatments and interventions, ordering and review of laboratory studies, ordering and review of radiographic studies, pulse oximetry and re-evaluation of patient's condition.   Glynn Octave, MD 05/08/11 1122

## 2011-05-08 NOTE — Progress Notes (Signed)
Inpatient Diabetes Program Recommendations  AACE/ADA: New Consensus Statement on Inpatient Glycemic Control (2009)  Target Ranges:  Prepandial:   less than 140 mg/dL      Peak postprandial:   less than 180 mg/dL (1-2 hours)      Critically ill patients:  140 - 180 mg/dL   New-onset DM   Inpatient Diabetes Program Recommendations HgbA1C: A1C pending results Outpatient Referral: MD: patient needs order for outpatient education   Patient education orders have been entered for bedside RN to begin basic patient education using the DM videos on the patient education channel and also the 'Living Well with Diabetes' patient education workbook from the pharmacy floorstock.

## 2011-05-08 NOTE — Consult Note (Signed)
Reason for Consult: NSTEMI Referring Physician: Kavion Mancinas is an 66 y.o. male.  HPI:the patient is a 66 year old African American male who is obese and presents with untreated hypertension diabetes mellitus. He has a 70-pack-year smoking history. He has not seen a physician in 6-7 years. Has a brother who is deceased from MI at age 55.  Patient presented with shortness of breath. He stated it started yesterday with severe enough that he required EMS attention.  He denies any chest pain or discomfort, no radiation to arm neck or back, no diaphoresis no claudication symptoms no orthopnea or paroxysmal nocturnal dyspnea in the last week. He states he's not reactive he occasionally will walk on a neighbors house and back otherwise he is retired and spends most of his time in the couch watching television.  Past Medical History  Diagnosis Date  . Hypertension   . Diabetes mellitus   . CHF (congestive heart failure)     Past Surgical History  Procedure Date  . Cyst removal from  right leg about 6 years ago     Family history Mother: Decease age 75, father deceased age 74, brother deceased age 2 from myocardial infarction.  Social History:  reports that he has been smoking Cigarettes.  He has a 78 pack-year smoking history. He quit smokeless tobacco use yesterday. He reports that he does not use illicit drugs. Patient drinks approximately 2 beers every other day.  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  Allergies: No Known Allergies  Medications: patient takes no medications at home.  Current inpatient medications:    . aspirin  324 mg Oral Once  . aspirin  325 mg Oral Daily  . carvedilol  6.25 mg Oral BID WC  . furosemide  40 mg Intravenous Once  . furosemide  40 mg Intravenous Q12H  . insulin aspart  0-15 Units Subcutaneous TID WC  . insulin aspart  0-5 Units Subcutaneous QHS  . insulin aspart  3 Units Subcutaneous TID WC  .  insulin glargine  14 Units Subcutaneous NOW  . insulin glargine  15 Units Subcutaneous QHS  . lisinopril  5 mg Oral Daily  . living well with diabetes book   Does not apply Once  . moxifloxacin  400 mg Intravenous Q24H  . nicotine  21 mg Transdermal Q24H  . nitroGLYCERIN  2-200 mcg/min Intravenous Once  . potassium chloride  40 mEq Oral Once  . sodium chloride  3 mL Intravenous Q12H  . DISCONTD: carvedilol  3.125 mg Oral BID WC  . DISCONTD: enoxaparin  1 mg/kg Subcutaneous Q12H  . DISCONTD: enoxaparin (LOVENOX) injection  120 mg Subcutaneous Q12H  . DISCONTD: enoxaparin  40 mg Subcutaneous Q24H  . DISCONTD: insulin regular  0-10 Units Intravenous TID WC  . DISCONTD: ipratropium  0.5 mg Nebulization Q6H  . DISCONTD: ipratropium  0.5 mg Nebulization BID  . DISCONTD: levalbuterol  0.63 mg Nebulization Q6H  . DISCONTD: levalbuterol  0.63 mg Nebulization BID    Results for orders placed during the hospital encounter of 05/07/11 (from the past 48 hour(s))  CBC     Status: Abnormal   Collection Time   05/07/11  4:56 PM      Component Value Range Comment   WBC 13.7 (*) 4.0 - 10.5 (K/uL)    RBC 4.87  4.22 - 5.81 (MIL/uL)    Hemoglobin 14.1  13.0 - 17.0 (g/dL)    HCT 16.1  09.6 -  52.0 (%)    MCV 84.4  78.0 - 100.0 (fL)    MCH 29.0  26.0 - 34.0 (pg)    MCHC 34.3  30.0 - 36.0 (g/dL)    RDW 16.1  09.6 - 04.5 (%)    Platelets 190  150 - 400 (K/uL)   DIFFERENTIAL     Status: Abnormal   Collection Time   05/07/11  4:56 PM      Component Value Range Comment   Neutrophils Relative 78 (*) 43 - 77 (%)    Neutro Abs 10.7 (*) 1.7 - 7.7 (K/uL)    Lymphocytes Relative 17  12 - 46 (%)    Lymphs Abs 2.3  0.7 - 4.0 (K/uL)    Monocytes Relative 5  3 - 12 (%)    Monocytes Absolute 0.6  0.1 - 1.0 (K/uL)    Eosinophils Relative 0  0 - 5 (%)    Eosinophils Absolute 0.1  0.0 - 0.7 (K/uL)    Basophils Relative 0  0 - 1 (%)    Basophils Absolute 0.0  0.0 - 0.1 (K/uL)   COMPREHENSIVE METABOLIC PANEL      Status: Abnormal   Collection Time   05/07/11  4:56 PM      Component Value Range Comment   Sodium 137  135 - 145 (mEq/L)    Potassium 3.4 (*) 3.5 - 5.1 (mEq/L)    Chloride 98  96 - 112 (mEq/L)    CO2 20  19 - 32 (mEq/L)    Glucose, Bld 412 (*) 70 - 99 (mg/dL)    BUN 10  6 - 23 (mg/dL)    Creatinine, Ser 4.09  0.50 - 1.35 (mg/dL)    Calcium 8.8  8.4 - 10.5 (mg/dL)    Total Protein 6.8  6.0 - 8.3 (g/dL)    Albumin 3.6  3.5 - 5.2 (g/dL)    AST 811 (*) 0 - 37 (U/L)    ALT 69 (*) 0 - 53 (U/L)    Alkaline Phosphatase 75  39 - 117 (U/L)    Total Bilirubin 0.3  0.3 - 1.2 (mg/dL)    GFR calc non Af Amer >90  >90 (mL/min)    GFR calc Af Amer >90  >90 (mL/min)   PROTIME-INR     Status: Normal   Collection Time   05/07/11  4:56 PM      Component Value Range Comment   Prothrombin Time 13.3  11.6 - 15.2 (seconds)    INR 0.99  0.00 - 1.49    D-DIMER, QUANTITATIVE     Status: Abnormal   Collection Time   05/07/11  4:56 PM      Component Value Range Comment   D-Dimer, Quant 0.84 (*) 0.00 - 0.48 (ug/mL-FEU)   PRO B NATRIURETIC PEPTIDE     Status: Abnormal   Collection Time   05/07/11  5:02 PM      Component Value Range Comment   Pro B Natriuretic peptide (BNP) 744.7 (*) 0 - 125 (pg/mL)   CARDIAC PANEL(CRET KIN+CKTOT+MB+TROPI)     Status: Abnormal   Collection Time   05/07/11  5:13 PM      Component Value Range Comment   Total CK 235 (*) 7 - 232 (U/L)    CK, MB 4.9 (*) 0.3 - 4.0 (ng/mL)    Troponin I <0.30  <0.30 (ng/mL)    Relative Index 2.1  0.0 - 2.5    URINALYSIS, ROUTINE W REFLEX MICROSCOPIC  Status: Abnormal   Collection Time   05/07/11  6:13 PM      Component Value Range Comment   Color, Urine YELLOW  YELLOW     APPearance CLEAR  CLEAR     Specific Gravity, Urine 1.020  1.005 - 1.030     pH 5.5  5.0 - 8.0     Glucose, UA >1000 (*) NEGATIVE (mg/dL)    Hgb urine dipstick TRACE (*) NEGATIVE     Bilirubin Urine NEGATIVE  NEGATIVE     Ketones, ur NEGATIVE  NEGATIVE (mg/dL)    Protein,  ur 161 (*) NEGATIVE (mg/dL)    Urobilinogen, UA 0.2  0.0 - 1.0 (mg/dL)    Nitrite NEGATIVE  NEGATIVE     Leukocytes, UA NEGATIVE  NEGATIVE    URINE MICROSCOPIC-ADD ON     Status: Abnormal   Collection Time   05/07/11  6:13 PM      Component Value Range Comment   Squamous Epithelial / LPF RARE  RARE     WBC, UA 0-2  <3 (WBC/hpf)    RBC / HPF 0-2  <3 (RBC/hpf)    Casts HYALINE CASTS (*) NEGATIVE    MRSA PCR SCREENING     Status: Normal   Collection Time   05/07/11 10:29 PM      Component Value Range Comment   MRSA by PCR NEGATIVE  NEGATIVE    CARDIAC PANEL(CRET KIN+CKTOT+MB+TROPI)     Status: Abnormal   Collection Time   05/07/11 10:33 PM      Component Value Range Comment   Total CK 262 (*) 7 - 232 (U/L)    CK, MB 5.8 (*) 0.3 - 4.0 (ng/mL)    Troponin I <0.30  <0.30 (ng/mL)    Relative Index 2.2  0.0 - 2.5    CBC     Status: Abnormal   Collection Time   05/07/11 10:33 PM      Component Value Range Comment   WBC 10.8 (*) 4.0 - 10.5 (K/uL)    RBC 4.62  4.22 - 5.81 (MIL/uL)    Hemoglobin 13.4  13.0 - 17.0 (g/dL)    HCT 09.6 (*) 04.5 - 52.0 (%)    MCV 84.2  78.0 - 100.0 (fL)    MCH 29.0  26.0 - 34.0 (pg)    MCHC 34.4  30.0 - 36.0 (g/dL)    RDW 40.9  81.1 - 91.4 (%)    Platelets 207  150 - 400 (K/uL)   CREATININE, SERUM     Status: Abnormal   Collection Time   05/07/11 10:33 PM      Component Value Range Comment   Creatinine, Ser 0.86  0.50 - 1.35 (mg/dL)    GFR calc non Af Amer 88 (*) >90 (mL/min)    GFR calc Af Amer >90  >90 (mL/min)   LIPID PANEL     Status: Abnormal   Collection Time   05/07/11 10:33 PM      Component Value Range Comment   Cholesterol 227 (*) 0 - 200 (mg/dL)    Triglycerides 782 (*) <150 (mg/dL)    HDL 32 (*) >95 (mg/dL)    Total CHOL/HDL Ratio 7.1      VLDL 35  0 - 40 (mg/dL)    LDL Cholesterol 621 (*) 0 - 99 (mg/dL)   GLUCOSE, CAPILLARY     Status: Abnormal   Collection Time   05/07/11 11:29 PM      Component Value Range Comment  Glucose-Capillary 372  (*) 70 - 99 (mg/dL)   KETONES, URINE     Status: Normal   Collection Time   05/08/11 12:10 AM      Component Value Range Comment   Ketones, ur NEGATIVE  NEGATIVE (mg/dL)   URINE RAPID DRUG SCREEN (HOSP PERFORMED)     Status: Normal   Collection Time   05/08/11 12:10 AM      Component Value Range Comment   Opiates NONE DETECTED  NONE DETECTED     Cocaine NONE DETECTED  NONE DETECTED     Benzodiazepines NONE DETECTED  NONE DETECTED     Amphetamines NONE DETECTED  NONE DETECTED     Tetrahydrocannabinol NONE DETECTED  NONE DETECTED     Barbiturates NONE DETECTED  NONE DETECTED    GLUCOSE, CAPILLARY     Status: Abnormal   Collection Time   05/08/11 12:57 AM      Component Value Range Comment   Glucose-Capillary 353 (*) 70 - 99 (mg/dL)   GLUCOSE, CAPILLARY     Status: Abnormal   Collection Time   05/08/11  1:45 AM      Component Value Range Comment   Glucose-Capillary 306 (*) 70 - 99 (mg/dL)   GLUCOSE, CAPILLARY     Status: Abnormal   Collection Time   05/08/11  3:04 AM      Component Value Range Comment   Glucose-Capillary 163 (*) 70 - 99 (mg/dL)   GLUCOSE, CAPILLARY     Status: Abnormal   Collection Time   05/08/11  4:03 AM      Component Value Range Comment   Glucose-Capillary 119 (*) 70 - 99 (mg/dL)   CARDIAC PANEL(CRET KIN+CKTOT+MB+TROPI)     Status: Abnormal   Collection Time   05/08/11  4:15 AM      Component Value Range Comment   Total CK 314 (*) 7 - 232 (U/L)    CK, MB 5.9 (*) 0.3 - 4.0 (ng/mL)    Troponin I <0.30  <0.30 (ng/mL)    Relative Index 1.9  0.0 - 2.5    BASIC METABOLIC PANEL     Status: Abnormal   Collection Time   05/08/11  4:15 AM      Component Value Range Comment   Sodium 141  135 - 145 (mEq/L)    Potassium 3.4 (*) 3.5 - 5.1 (mEq/L)    Chloride 104  96 - 112 (mEq/L)    CO2 25  19 - 32 (mEq/L)    Glucose, Bld 124 (*) 70 - 99 (mg/dL)    BUN 9  6 - 23 (mg/dL)    Creatinine, Ser 4.09  0.50 - 1.35 (mg/dL)    Calcium 8.8  8.4 - 10.5 (mg/dL)    GFR calc non Af Amer  >90  >90 (mL/min)    GFR calc Af Amer >90  >90 (mL/min)   GLUCOSE, CAPILLARY     Status: Abnormal   Collection Time   05/08/11  5:08 AM      Component Value Range Comment   Glucose-Capillary 133 (*) 70 - 99 (mg/dL)   GLUCOSE, CAPILLARY     Status: Abnormal   Collection Time   05/08/11  6:02 AM      Component Value Range Comment   Glucose-Capillary 213 (*) 70 - 99 (mg/dL)   GLUCOSE, CAPILLARY     Status: Abnormal   Collection Time   05/08/11  6:55 AM      Component Value Range Comment   Glucose-Capillary  160 (*) 70 - 99 (mg/dL)    Comment 1 Notify RN     GLUCOSE, CAPILLARY     Status: Abnormal   Collection Time   05/08/11  7:59 AM      Component Value Range Comment   Glucose-Capillary 146 (*) 70 - 99 (mg/dL)   GLUCOSE, CAPILLARY     Status: Abnormal   Collection Time   05/08/11  9:04 AM      Component Value Range Comment   Glucose-Capillary 247 (*) 70 - 99 (mg/dL)   CARDIAC PANEL(CRET KIN+CKTOT+MB+TROPI)     Status: Abnormal   Collection Time   05/08/11 10:35 AM      Component Value Range Comment   Total CK 27  7 - 232 (U/L)    CK, MB 7.0 (*) 0.3 - 4.0 (ng/mL)    Troponin I 4.08 (*) <0.30 (ng/mL)    Relative Index RELATIVE INDEX IS INVALID  0.0 - 2.5    GLUCOSE, CAPILLARY     Status: Abnormal   Collection Time   05/08/11 11:57 AM      Component Value Range Comment   Glucose-Capillary 188 (*) 70 - 99 (mg/dL)    Comment 1 Documented in Chart      Comment 2 Notify RN       Dg Chest 2 View  05/08/2011  *RADIOLOGY REPORT*  Clinical Data: Pneumonia and shortness of breath.  CHEST - 2 VIEW  Comparison: Multiple priors, including chest x-ray 05/07/2011.  Findings: Compared to the recent prior examination, lung volumes have improved.  There continues to be mild diffuse interstitial prominence and patchy airspace opacities, however, these have significantly decreased compared to the prior examination and are predominately in the lower lungs at this point.  Trace bilateral pleural effusions.   Minimal residual vascular congestion.  Heart size is within normal limits.  Mediastinal contours are unremarkable.  IMPRESSION:  1.  Improving aeration in the lungs bilaterally likely reflect resolving pulmonary edema.  Small bilateral pleural effusions have also decreased.  Original Report Authenticated By: Florencia Reasons, M.D.   Ct Angio Chest W/cm &/or Wo Cm  05/07/2011  *RADIOLOGY REPORT*  Clinical Data: 66 year old male with shortness of breath. Tachycardia.  CT ANGIOGRAPHY CHEST  Technique:  Multidetector CT imaging of the chest using the standard protocol during bolus administration of intravenous contrast. Multiplanar reconstructed images including MIPs were obtained and reviewed to evaluate the vascular anatomy.  Contrast: OMNIPAQUE IOHEXOL 350 MG/ML IV SOLN  Comparison: Chest radiographs 05/07/2011.  Findings: Adequate contrast bolus timing in the pulmonary arterial tree.  Respiratory motion artifact in the lower lungs. No focal filling defect identified in the pulmonary arterial tree to suggest the presence of acute pulmonary embolism.  Atelectatic changes to the major airways.  Layering bilateral pleural effusions, small and greater on the right.  No pericardial effusion.  Pulmonary atelectasis.  Ground-glass opacity in the superior segment of the left lower lobe and the lateral aspect of the superior right lower lobes segment.  Nodular bronchovascular opacity in the lingula measuring 9 mm (image 49).  Negative visualized upper abdominal viscera.  Increased number of mediastinal nodes.  Abnormally enlarged right paratracheal node of the 15 mm in short axis.  Small hilar nodes may be increased in number.  Negative thoracic inlet.  No axillary lymphadenopathy.  No acute osseous abnormality identified.  IMPRESSION: 1. No evidence of acute pulmonary embolus. 2.  Small bilateral pleural effusions with atelectasis and superimposed bilateral superior segment lower lobe patchy/ground-  glass opacities  which presumably represent infection. 3.  Nodular more solid indeterminate opacity in the lingula measuring 9 mm (series 5 image 49).  Recommend post-treatment follow-up chest CT once the acute pulmonary process has clinically resolved to document resolution. 4.  Mediastinal lymphadenopathy favor to be reactive.  Attention directed on follow-up.  Original Report Authenticated By: Harley Hallmark, M.D.   Dg Chest Portable 1 View  05/07/2011  *RADIOLOGY REPORT*  Clinical Data: Shortness of breath, weakness, tachycardia  PORTABLE CHEST - 1 VIEW  Comparison: None.  Findings: There is airspace disease primarily at the lung bases although there is some  indistinctness of the perihilar vasculature as well.  These findings may represent pneumonia, but pulmonary vascular congestion and mild edema cannot be excluded.  Follow-up two-view chest x-ray is recommended.  No definite effusion is seen. The heart is mildly enlarged.  IMPRESSION: Airspace disease and some perihilar vascular prominence.  Consider pneumonia or possibly pulmonary vascular congestion and edema. Recommend two-view chest x-ray.  Original Report Authenticated By: Juline Patch, M.D.    Review of Systems  Constitutional: Negative for fever and diaphoresis.  HENT: Negative for congestion and neck pain.   Eyes: Negative for blurred vision and double vision.  Respiratory: Positive for cough and shortness of breath. Negative for wheezing.   Cardiovascular: Positive for palpitations. Negative for chest pain, orthopnea, claudication, leg swelling and PND.  Gastrointestinal: Positive for abdominal pain (Abdominal tightness). Negative for nausea, vomiting, diarrhea, constipation, blood in stool and melena.  Genitourinary: Positive for frequency. Negative for dysuria and hematuria.  Musculoskeletal: Negative for back pain.  Neurological: Negative for dizziness, weakness and headaches.  All other systems reviewed and are negative.   Blood pressure  148/92, pulse 79, temperature 97.4 F (36.3 C), temperature source Oral, resp. rate 18, height 5\' 11"  (1.803 m), weight 119.2 kg (262 lb 12.6 oz), SpO2 95.00%. Physical Exam  Constitutional: He is oriented to person, place, and time. No distress.       Obese  HENT:  Head: Normocephalic and atraumatic.  Eyes: EOM are normal. Pupils are equal, round, and reactive to light. No scleral icterus.  Neck: Normal range of motion. Neck supple.  Cardiovascular: Normal rate and regular rhythm.   No murmur heard. Respiratory: Effort normal and breath sounds normal. He has no wheezes. He has no rales.  GI: Bowel sounds are normal. He exhibits distension. There is no tenderness.  Musculoskeletal:       1 LEE   Lymphadenopathy:    He has no cervical adenopathy.  Neurological: He is alert and oriented to person, place, and time. He exhibits normal muscle tone.  Skin: Skin is warm and dry.  Psychiatric: He has a normal mood and affect.    Assessment/Plan: Patient Active Hospital Problem List:  NSTEMI (non-ST elevated myocardial infarction) (05/08/2011) Diabetes mellitus (05/07/2011) Pulmonary edema (05/07/2011) Pneumonia (05/07/2011) HTN (hypertension), malignant (05/07/2011) Tobacco abuse (05/07/2011) CHF, acute (05/07/2011)   Plan: The patient has had slowly increasing CK-MBs and third check of cardiac enzymes revealed a troponin of 4. Subsequent EKG shows lateral T-wave inversion. He was already started on nitroglycerin IV for  hypertension. We'll also start IV heparin per pharmacy.  Patient will be limited to activity within the room.  2-D echocardiogram has been completed and [facility] will read.  Considering the patient just had a CT angiogram will allow his kidneys to recover and schedule left heart catheterization for Monday.  Agree with continued diuresis, aspirin, beta  blocker, ACE inhibitor.  Continue to monitor heart rate and blood pressure and continue to cycle cardiac  enzymes.  HAGER,BRYAN W 05/08/2011, 1:23 PM   I saw and examined the patient along with Mr. Leron Croak, [place].  We discussed the impression and plan is limited noted above.   I agree with his note.   66 year old gentleman without any significant medical history notably because he has not gone to see a physician  in quite some time likely has hypertension by presentation, dyslipidemia and diabetes by laboratory evaluation who presented with lower bowel pain acute onset shortness of breath with appear to be hypertensive urgency/accelerated hypertension in the emergency room relieved with nitroglycerin and Lasix. Subsequent cardiac enzymes have demonstrated evidence of myocardial injury which is setting of acute onset dyspnea and now lateral T-wave inversions on ECG would suggest a non-STEMI.  Unfortunately he has had breakfast today and start his lunch which precludes proceeding with cardiac catheterization today.  He is also received in the dye load from the chest CT.  Willing to weekend treated medically and continue titration of beta blockers and ACE inhibitor continue nitroglycerin drip initiated anticoagulation with IV heparin, and add aspirin.  Initiate statin therapy for dyslipidemia. Smoking cessation counseling. He does not appear to be any acute distress with a CHF standpoint but his blood pressure being controlled we will monitor for any evidence of volume overload.  He will likely not need standing dose of Lasix.   I will defer treatment of likely diagnoses diabetes to the [facility] hospitalist service.  We will keep the patient in the step down unit with plans to proceed with left heart catheterization on Monday.   Marykay Lex, M.D., M.S. [facility] [address] [place], [place]  [address]  [phone]  05/08/2011 2:04 PM

## 2011-05-08 NOTE — Progress Notes (Signed)
ANTICOAGULATION CONSULT NOTE - FOLLOW UP  Pharmacy Consult for heparin Indication: ACS/STEMI   HL = 0.14 (goal 0.3 - 0.7 units/mL) Heparin weight = 100 kg  Assessment: 65 YOM with ACS/STEMI on heparin gtt with subtherapeutic heparin level.  Heparin gtt infusing without interruptions and IV site intact per RN.  No bleeding noted.  Plan: - Rebolus with 3000 units IV heparin, then - Increase heparin gtt to 1850 units/hr - Check 6 hr HL   Phillips Climes, PharmD 05/08/2011,9:13 PM

## 2011-05-08 NOTE — Progress Notes (Signed)
[facility] [place] TEAM 8  Subjective: 66 year old male with history of tobacco abuse, who had not seen a physician in last 6 years presented to [facility] emergency room with acute shortness of breath with sudden onset.  The emergency room workup showed elevated blood pressure 207/126, chest x-ray with pulmonary edema, BNP of 744. Patient was also noted to have leukocytosis with WBC of 13.7, and blood glucose of 412 with anion gap of 19. Patient denies any known history of diabetes mellitus or CHF.  His EKG at the time of his presentation was without evidence of acute changes.  At the time of my exam today the patient is resting comfortably in the bedside chair.  He denies chest pain shortness of breath fevers or chills.  He reports he feels much better overall.  He is anxious to be discharged home.  Objective: Weight change:   Intake/Output Summary (Last 24 hours) at 05/08/11 1113 Last data filed at 05/08/11 0800  Gross per 24 hour  Intake 1758.26 ml  Output   1600 ml  Net 158.26 ml   Blood pressure 160/83, pulse 83, temperature 98.5 F (36.9 C), temperature source Oral, resp. rate 20, height 5\' 11"  (1.803 m), weight 119.2 kg (262 lb 12.6 oz), SpO2 87.00%.  Physical Exam: General: No acute respiratory distress Lungs: Clear to auscultation bilaterally without wheezes or crackles Cardiovascular: Regular rate and rhythm without murmur gallop or rub  Abdomen: Nontender, nondistended, soft, bowel sounds positive, no rebound, no ascites, no appreciable mass Extremities: No significant cyanosis, clubbing, or edema bilateral lower extremities  Lab Results:  Basename 05/08/11 0415 05/07/11 2233 05/07/11 1656  NA 141 -- 137  K 3.4* -- 3.4*  CL 104 -- 98  CO2 25 -- 20  GLUCOSE 124* -- 412*  BUN 9 -- 10  CREATININE 0.80 0.86 0.77  CALCIUM 8.8 -- 8.8  MG -- -- --  PHOS -- -- --    Basename 05/07/11 1656  AST 143*  ALT 69*  ALKPHOS 75  BILITOT 0.3  PROT 6.8  ALBUMIN  3.6    Basename 05/07/11 2233 05/07/11 1656  WBC 10.8* 13.7*  NEUTROABS -- 10.7*  HGB 13.4 14.1  HCT 38.9* 41.1  MCV 84.2 84.4  PLT 207 190    Basename 05/08/11 0415 05/07/11 2233 05/07/11 1713  CKTOTAL 314* 262* 235*  CKMB 5.9* 5.8* 4.9*  CKMBINDEX -- -- --  TROPONINI <0.30 <0.30 <0.30   Micro Results: Recent Results (from the past 240 hour(s))  MRSA PCR SCREENING     Status: Normal   Collection Time   05/07/11 10:29 PM      Component Value Range Status Comment   MRSA by PCR NEGATIVE  NEGATIVE  Final     Studies/Results: All recent x-ray/radiology reports have been reviewed in detail.   Medications: I have reviewed the patient's complete medication list.  Assessment/Plan:  NSTEMI The patient reportedly never had actual complaints of chest pain his cardiac enzymes have now returned positive - I suspect that he likely suffered a myocardial infarction at the time of his initial symptoms, or shortly before - I have maximized his medical therapy - I. have consulted cardiology for a definitive coronary evaluation  Pulmonary Edema/acute CHF (type unknown at present) Echo pending to qualify and quantify form of CHF - CXR confirms improved aeration - clinically the patient is much improved  Malignant HTN BP control improved - we will continue nitro drip given myocardial infarction  Newly diagnosed severly uncontrolled  DM Needs ace inhibitor (protein in urine) - check A1c - d/c insulin gtt - begin to titrate tx regimen  Tobacco abuse I have counseled him directly as to the absolute need to discontinue smoking entirely-I will request tobacco cessation consultation to help encourage this point  Nodular solid indeterminate opacity in the lingula (9 mm) Will need f/u CT chest to assure has resolved/determine need for further investigation  Hypokalemia Diuretic induced - replace   Hypercholesterolemia LDL of 160 - will begin tx once LFTs have normalized   Mild transaminitis   Likely passive congestion due to CHF - recheck prior to d/c   Leukocytosis  Likely stressed demargination - we'll follow  Lonia Blood, MD [facility] Office  [phone] Pager [phone]  On-Call/Text Page:      [email or web address]      password [facility]

## 2011-05-08 NOTE — Progress Notes (Signed)
Utilization Review Completed.Miguel Harvey T2/10/2011

## 2011-05-08 NOTE — Consult Note (Signed)
Miguel Harvey is a 2 ppd smoker who is eager to quit and in action stage. He chooses using 2 patches to quit from list of options presented to him to help him quit. Recommended to start with 42 mg patch using two 21 mg patches. Discussed and wrote down patch use instructions for the pt. Referred to 1-800 quit now for f/u and support. Discussed oral fixation substitutes, second hand smoke and in home smoking policy. Reviewed and gave pt Written education/contact information.

## 2011-05-08 NOTE — Progress Notes (Signed)
  Echocardiogram 2D Echocardiogram has been performed.  Cathie Beams Deneen 05/08/2011, 9:57 AM

## 2011-05-09 ENCOUNTER — Other Ambulatory Visit: Payer: Self-pay

## 2011-05-09 LAB — CBC
HCT: 37.6 % — ABNORMAL LOW (ref 39.0–52.0)
Hemoglobin: 12.7 g/dL — ABNORMAL LOW (ref 13.0–17.0)
RBC: 4.47 MIL/uL (ref 4.22–5.81)

## 2011-05-09 LAB — GLUCOSE, CAPILLARY
Glucose-Capillary: 276 mg/dL — ABNORMAL HIGH (ref 70–99)
Glucose-Capillary: 225 mg/dL — ABNORMAL HIGH (ref 70–99)
Glucose-Capillary: 169 mg/dL — ABNORMAL HIGH (ref 70–99)

## 2011-05-09 LAB — HEPATIC FUNCTION PANEL
ALT: 33 U/L (ref 0–53)
Albumin: 3.3 g/dL — ABNORMAL LOW (ref 3.5–5.2)
Alkaline Phosphatase: 55 U/L (ref 39–117)
Total Protein: 6 g/dL (ref 6.0–8.3)

## 2011-05-09 LAB — BASIC METABOLIC PANEL
Chloride: 101 mEq/L (ref 96–112)
GFR calc Af Amer: >90 mL/min (ref >90)
GFR calc non Af Amer: 88 mL/min — ABNORMAL LOW (ref >90)
Glucose, Bld: 203 mg/dL — ABNORMAL HIGH (ref 70–99)
Potassium: 3.3 mEq/L — ABNORMAL LOW (ref 3.5–5.1)
Sodium: 137 mEq/L (ref 135–145)

## 2011-05-09 LAB — HEPARIN LEVEL (UNFRACTIONATED): Heparin Unfractionated: 0.44 IU/mL (ref 0.30–0.70)

## 2011-05-09 LAB — CARDIAC PANEL(CRET KIN+CKTOT+MB+TROPI): Troponin I: <0.3 ng/mL (ref <0.30)

## 2011-05-09 MED ORDER — CARVEDILOL 12.5 MG PO TABS
12.5000 mg | ORAL_TABLET | Freq: Two times a day (BID) | ORAL | Status: DC
  Administered 2011-05-09 – 2011-05-11 (×5): 12.5 mg via ORAL
  Filled 2011-05-09 (×6): qty 1

## 2011-05-09 MED ORDER — POTASSIUM CHLORIDE CRYS ER 20 MEQ PO TBCR
40.0000 meq | EXTENDED_RELEASE_TABLET | Freq: Every day | ORAL | Status: DC
  Administered 2011-05-09 – 2011-05-11 (×3): 40 meq via ORAL
  Filled 2011-05-09 (×4): qty 2

## 2011-05-09 MED ORDER — LISINOPRIL 10 MG PO TABS
10.0000 mg | ORAL_TABLET | Freq: Every day | ORAL | Status: DC
  Administered 2011-05-09 – 2011-05-10 (×2): 10 mg via ORAL
  Filled 2011-05-09 (×2): qty 1

## 2011-05-09 MED ORDER — INSULIN ASPART 100 UNIT/ML ~~LOC~~ SOLN
6.0000 [IU] | Freq: Three times a day (TID) | SUBCUTANEOUS | Status: DC
  Administered 2011-05-09 – 2011-05-10 (×4): 6 [IU] via SUBCUTANEOUS

## 2011-05-09 MED ORDER — POTASSIUM CHLORIDE CRYS ER 20 MEQ PO TBCR: 40.0000 meq | EXTENDED_RELEASE_TABLET | Freq: Once | ORAL | Status: DC

## 2011-05-09 MED ORDER — ROSUVASTATIN CALCIUM 20 MG PO TABS
20.0000 mg | ORAL_TABLET | Freq: Every day | ORAL | Status: DC
  Administered 2011-05-09 – 2011-05-13 (×5): 20 mg via ORAL
  Filled 2011-05-09 (×6): qty 1

## 2011-05-09 MED ORDER — INSULIN GLARGINE 100 UNIT/ML ~~LOC~~ SOLN
18.0000 [IU] | Freq: Every day | SUBCUTANEOUS | Status: DC
  Administered 2011-05-09: 18 [IU] via SUBCUTANEOUS
  Filled 2011-05-09: qty 3

## 2011-05-09 NOTE — Progress Notes (Addendum)
ANTICOAGULATION CONSULT NOTE - FOLLOW UP  Pharmacy Consult for heparin Indication: ACS/STEMI   HL = 0.44 (goal 0.3 - 0.7 units/mL) Heparin weight = 100 kg  Assessment: 65 YOM with ACS/STEMI on heparin gtt with therapeutic heparin level.  No issues with Heparin gtt per RN.  No bleeding noted. CBC stable  Plan: - Continue heparin gtt at 1850 units/hr - F/U daily Heparin Level and CBC   Janace Litten, PharmD 05/09/2011,7:38 AM

## 2011-05-09 NOTE — Progress Notes (Signed)
[facility]  Subjective: No Complaints  Objective: Vital signs in last 24 hours: Temp:  [97.4 F (36.3 C)-98.5 F (36.9 C)] 98 F (36.7 C) (02/09 0747) Pulse Rate:  [73-85] 81  (02/09 0747) Resp:  [18-20] 18  (02/09 0747) BP: (113-163)/(36-96) 163/96 mmHg (02/09 0747) SpO2:  [87 %-99 %] 99 % (02/09 0747) Weight:  [116.574 kg (257 lb)] 116.574 kg (257 lb) (02/09 0500)    Intake/Output from previous day: 02/08 0701 - 02/09 0700 In: 1569.2 [P.O.:720; I.V.:599.2; IV Piggyback:250] Out: 1475 [Urine:1475] Intake/Output this shift:    Medications Current Facility-Administered Medications  Medication Dose Route Frequency Provider Last Rate Last Dose  . 0.9 %  sodium chloride infusion  250 mL Intravenous PRN Ripudeep K Rai, MD      . 0.9 %  sodium chloride infusion   Intravenous Continuous Ripudeep Jenna Luo, MD 10 mL/hr at 05/08/11 1900    . acetaminophen (TYLENOL) tablet 650 mg  650 mg Oral Q4H PRN Ripudeep K Rai, MD      . aspirin tablet 325 mg  325 mg Oral Daily Ripudeep Jenna Luo, MD   325 mg at 05/08/11 0951  . carvedilol (COREG) tablet 6.25 mg  6.25 mg Oral BID WC Dwana Melena, PA   6.25 mg at 05/08/11 1740  . darbepoetin (ARANESP) 40 MCG/0.4ML injection           . dextrose 50 % solution 25 mL  25 mL Intravenous PRN Ripudeep K Rai, MD      . furosemide (LASIX) tablet 20 mg  20 mg Oral BID Lonia Blood, MD   20 mg at 05/08/11 1842  . heparin ADULT infusion 100 units/ml (25000 units/250 ml)  1,850 Units/hr Intravenous Continuous Lennon Alstrom, PHARMD 18.5 mL/hr at 05/09/11 0213 1,850 Units/hr at 05/09/11 0213  . heparin bolus via infusion 3,000 Units  3,000 Units Intravenous Once [facility], PHARMD   3,000 Units at 05/08/11 2132  . insulin aspart (novoLOG) injection 0-15 Units  0-15 Units Subcutaneous TID WC Lonia Blood, MD   5 Units at 05/08/11 1739  . insulin aspart (novoLOG) injection 0-5 Units  0-5 Units Subcutaneous QHS Lonia Blood, MD   2 Units at 05/08/11 2300  . insulin aspart (novoLOG) injection 3 Units  3 Units Subcutaneous TID WC Lonia Blood, MD   3 Units at 05/08/11 1740  . insulin glargine (LANTUS) injection 14 Units  14 Units Subcutaneous NOW Lonia Blood, MD   14 Units at 05/08/11 1300  . insulin glargine (LANTUS) injection 15 Units  15 Units Subcutaneous QHS Lonia Blood, MD      . insulin regular (NOVOLIN R,HUMULIN R) 1 Units/mL in sodium chloride 0.9 % 100 mL infusion   Intravenous Continuous Lonia Blood, MD 1.7 mL/hr at 05/08/11 0800 1.7 Units/hr at 05/08/11 0800  . levalbuterol (XOPENEX) nebulizer solution 0.63 mg  0.63 mg Nebulization Q6H PRN Provider Default, MD   0.63 mg at 05/07/11 2359  . lisinopril (PRINIVIL,ZESTRIL) tablet 5 mg  5 mg Oral Daily Ripudeep Jenna Luo, MD   5 mg at 05/08/11 0951  . living well with diabetes book MISC   Does not apply Once Ripudeep K Rai, MD      . moxifloxacin (AVELOX) IVPB 400 mg  400 mg Intravenous Q24H Ripudeep K Rai, MD   400 mg at 05/08/11 2302  . nicotine (NICODERM CQ - dosed in mg/24 hours) patch 21 mg  21 mg Transdermal Q24H Ripudeep Jenna Luo, MD   21 mg at 05/08/11 2301  . nitroGLYCERIN (NITROSTAT) SL tablet 0.4 mg  0.4 mg Sublingual Q5 Min x 3 PRN Warnell Forester, MD      . nitroGLYCERIN 0.2 mg/mL in dextrose 5 % infusion  5 mcg/min Intravenous Continuous Lonia Blood, MD 1.5 mL/hr at 05/08/11 1900 5 mcg/min at 05/08/11 1900  . ondansetron (ZOFRAN) injection 4 mg  4 mg Intravenous Q6H PRN Ripudeep K Rai, MD      . paricalcitol (ZEMPLAR) 5 MCG/ML injection           . sodium chloride 0.9 % injection 3 mL  3 mL Intravenous Q12H Ripudeep K Rai, MD   3 mL at 05/08/11 2130  . sodium chloride 0.9 % injection 3 mL  3 mL Intravenous PRN Ripudeep Jenna Luo, MD      . DISCONTD: carvedilol (COREG) tablet 3.125 mg  3.125 mg Oral BID WC Ripudeep K Rai, MD   3.125 mg at 05/08/11 0951  . DISCONTD: carvedilol (COREG) tablet 6.25 mg  6.25 mg Oral BID WC Lonia Blood, MD      . DISCONTD: dextrose 5 %-0.45 % sodium chloride infusion   Intravenous Continuous Ripudeep K Rai, MD 20 mL/hr at 05/08/11 0800    . DISCONTD: enoxaparin (LOVENOX) injection 119 mg  1 mg/kg Subcutaneous Q12H Lonia Blood, MD      . DISCONTD: enoxaparin (LOVENOX) injection 120 mg  120 mg Subcutaneous Q12H Herby Abraham, PHARMD      . DISCONTD: enoxaparin (LOVENOX) injection 40 mg  40 mg Subcutaneous Q24H Ripudeep Jenna Luo, MD   40 mg at 05/07/11 2304  . DISCONTD: furosemide (LASIX) injection 40 mg  40 mg Intravenous Q12H Ripudeep K Rai, MD   40 mg at 05/08/11 1100  . DISCONTD: heparin ADULT infusion 100 units/ml (25000 units/250 ml)  1,500 Units/hr Intravenous Continuous Lonia Blood, MD 15 mL/hr at 05/08/11 1900 1,500 Units/hr at 05/08/11 1900  . DISCONTD: insulin regular bolus via infusion 0-10 Units  0-10 Units Intravenous TID WC Ripudeep K Rai, MD      . DISCONTD: ipratropium (ATROVENT) nebulizer solution 0.5 mg  0.5 mg Nebulization BID Provider Default, MD   0.5 mg at 05/08/11 0818  . DISCONTD: ipratropium (ATROVENT) nebulizer solution 0.5 mg  0.5 mg Nebulization Q6H PRN Provider Default, MD   0.5 mg at 05/07/11 2359  . DISCONTD: levalbuterol (XOPENEX) nebulizer solution 0.63 mg  0.63 mg Nebulization BID Provider Default, MD   0.63 mg at 05/08/11 0818  . DISCONTD: nitroGLYCERIN 0.2 mg/mL in dextrose 5 % infusion  5 mcg/min Intravenous Titrated Ripudeep K Rai, MD 4.5 mL/hr at 05/08/11 0910 15 mcg/min at 05/08/11 0910    PE: General appearance: alert, cooperative and no distress Lungs: Decreased BS otherwise clear to auscultation bilaterally Heart: regular rate and rhythm, S1, S2 normal, no murmur, click, rub or gallop Abdomen: soft, non-tender; bowel sounds normal; Abdomin large. Extremities: No LEE Pulses: 2+ and symmetric  Lab Results:   Basename 05/09/11 0339 05/07/11 2233 05/07/11 1656  WBC 9.6 10.8* 13.7*  HGB 12.7* 13.4 14.1  HCT 37.6* 38.9* 41.1  PLT 191  207 190   BMET  Basename 05/09/11 0339 05/08/11 0415 05/07/11 2233 05/07/11 1656  NA 137 141 -- 137  K 3.3* 3.4* -- 3.4*  CL 101 104 -- 98  CO2 24 25 -- 20  GLUCOSE 203* 124* -- 412*  BUN 10 9 --  10  CREATININE 0.87 0.80 0.86 --  CALCIUM 8.7 8.8 -- 8.8   PT/INR  Basename 05/07/11 1656  LABPROT 13.3  INR 0.99   Cholesterol Lipid Panel     Component Value Date/Time   CHOL 227* 05/07/2011 2233   TRIG 176* 05/07/2011 2233   HDL 32* 05/07/2011 2233   CHOLHDL 7.1 05/07/2011 2233   VLDL 35 05/07/2011 2233   LDLCALC 160* 05/07/2011 2233     Basename 05/07/11 2233  CHOL 227*   Cardiac Enzymes Troponin I: 4.08,  <0.30, <0.30, <0.30 CK-MB:5.8  5.9  7.0  4.7  4.1   Assessment/Plan   Principal Problem:  *NSTEMI (non-ST elevated myocardial infarction) Active Problems:  Diabetes mellitus  Pulmonary edema  Pneumonia  HTN (hypertension), malignant  Tobacco abuse  CHF, acute Hypokalemia  Plan:  Doing well.  On IV heparin and nitro.  Troponin back to normal.  CKMB still elevated. BP needs more control.  Will titrate BP meds.   LOS: 2 days    HAGER,BRYAN W 05/09/2011 8:32 AM   Patient seen and examined. Agree with assessment and plan. No Chest pain.  ECG with nonspecific T changes. Need further titration of meds.  Will increase Coreg to 12.5 mg bid, increase lisinopril to 10 mg, and start crestor 20 mg initially.  Discussed cath with pt, plan on Monday.   Lennette Bihari, MD, [facility] 05/09/2011 8:47 AM

## 2011-05-09 NOTE — Progress Notes (Signed)
[facility] [place] TEAM 8  Subjective: 66 year old male with history of tobacco abuse, who had not seen a physician in last 6 years presented to [facility] emergency room with acute shortness of breath with sudden onset.  The emergency room workup showed elevated blood pressure 207/126, chest x-ray with pulmonary edema, BNP of 744. Patient was also noted to have leukocytosis with WBC of 13.7, and blood glucose of 412 with anion gap of 19. Patient denies any known history of diabetes mellitus or CHF.  His EKG at the time of his presentation was without evidence of acute changes.  The patient is resting comfortably. He denies chest pain shortness of breath fevers or chills.  He reports he feels much better overall.    Objective: Weight change: -1.361 kg (-3 lb)  Intake/Output Summary (Last 24 hours) at 05/09/11 0848 Last data filed at 05/09/11 0600  Gross per 24 hour  Intake 1523.85 ml  Output   1475 ml  Net  48.85 ml   Blood pressure 163/96, pulse 81, temperature 98 F (36.7 C), temperature source Oral, resp. rate 18, height 5\' 11"  (1.803 m), weight 116.574 kg (257 lb), SpO2 99.00%.  Physical Exam: General: No acute respiratory distress Lungs: Clear to auscultation bilaterally without wheezes or crackles Cardiovascular: Regular rate and rhythm without murmur gallop or rub  Abdomen: Nontender, nondistended, soft, bowel sounds positive, no rebound, no ascites, no appreciable mass Extremities: No significant cyanosis, clubbing, or edema bilateral lower extremities  Lab Results:  Basename 05/09/11 0339 05/08/11 0415 05/07/11 2233 05/07/11 1656  NA 137 141 -- 137  K 3.3* 3.4* -- 3.4*  CL 101 104 -- 98  CO2 24 25 -- 20  GLUCOSE 203* 124* -- 412*  BUN 10 9 -- 10  CREATININE 0.87 0.80 0.86 --  CALCIUM 8.7 8.8 -- 8.8  MG -- -- -- --  PHOS -- -- -- --    Basename 05/09/11 0339 05/07/11 1656  AST 26 143*  ALT 33 69*  ALKPHOS 55 75  BILITOT 0.4 0.3  PROT 6.0 6.8    ALBUMIN 3.3* 3.6    Basename 05/09/11 0339 05/07/11 2233 05/07/11 1656  WBC 9.6 10.8* 13.7*  NEUTROABS -- -- 10.7*  HGB 12.7* 13.4 14.1  HCT 37.6* 38.9* 41.1  MCV 84.1 84.2 84.4  PLT 191 207 190    Basename 05/09/11 0339 05/08/11 2140 05/08/11 1534  CKTOTAL 308* 322* 316*  CKMB 4.1* 4.7* 5.1*  CKMBINDEX -- -- --  TROPONINI <0.30 <0.30 <0.30   Micro Results: Recent Results (from the past 240 hour(s))  MRSA PCR SCREENING     Status: Normal   Collection Time   05/07/11 10:29 PM      Component Value Range Status Comment   MRSA by PCR NEGATIVE  NEGATIVE  Final     Studies/Results: All recent x-ray/radiology reports have been reviewed in detail.   Medications: I have reviewed the patient's complete medication list.  Assessment/Plan:  NSTEMI The patient reportedly never had actual complaints of chest pain - his cardiac enzymes have now essentially normalized - I suspect that he likely suffered a myocardial infarction at the time of his initial symptoms, or shortly before - he remains on med tx - Cardiology is planning to perform a cath on Monday  Pulmonary Edema/acute CHF (type unknown at present) Echo pending to qualify and quantify form of CHF - clinically the patient is much improved  Malignant HTN BP control improved, but not yet controlled - adjust  tx regimen and follow   Newly diagnosed severly uncontrolled DM Needs ace inhibitor (protein in urine) - A1c pending - now off insulin gtt - continue to titrate tx regimen  Tobacco abuse I have counseled him directly as to the absolute need to discontinue smoking entirely - tobacco cessation consultation accomplished  Nodular solid indeterminate opacity in the lingula (9 mm) Will need f/u CT chest to assure has resolved/determine need for further investigation - will postpone to outpt setting due to dye load anticipated w/ cardiac cath   Hypokalemia Diuretic induced - cont to replace   Hypercholesterolemia LDL of 160  - will begin tx now that LFTs have normalized   Mild transaminitis  Likely passive congestion due to CHF - resolved    Leukocytosis/? B LL infiltrate Likely stress demargination - has normalized today - was placed on avelox at admit - I will d/c and follow clinically   Lonia Blood, MD [facility] Office  [phone] Pager [phone]  On-Call/Text Page:      [email or web address]      password [facility]

## 2011-05-10 LAB — CBC
Platelets: 213 10*3/uL (ref 150–400)
RDW: 14.3 % (ref 11.5–15.5)
WBC: 9.6 10*3/uL (ref 4.0–10.5)

## 2011-05-10 LAB — BASIC METABOLIC PANEL
Chloride: 101 mEq/L (ref 96–112)
Creatinine, Ser: 0.8 mg/dL (ref 0.50–1.35)
GFR calc Af Amer: >90 mL/min (ref >90)
Potassium: 3.6 mEq/L (ref 3.5–5.1)
Sodium: 138 mEq/L (ref 135–145)

## 2011-05-10 LAB — HEPARIN LEVEL (UNFRACTIONATED): Heparin Unfractionated: 0.54 IU/mL (ref 0.30–0.70)

## 2011-05-10 LAB — GLUCOSE, CAPILLARY
Glucose-Capillary: 255 mg/dL — ABNORMAL HIGH (ref 70–99)
Glucose-Capillary: 177 mg/dL — ABNORMAL HIGH (ref 70–99)
Glucose-Capillary: 202 mg/dL — ABNORMAL HIGH (ref 70–99)

## 2011-05-10 MED ORDER — SODIUM CHLORIDE 0.9 % IJ SOLN: 3.0000 mL | INTRAMUSCULAR | Status: DC | PRN

## 2011-05-10 MED ORDER — SODIUM CHLORIDE 0.9 % IV SOLN
1.0000 mL/kg/h | INTRAVENOUS | Status: DC
  Administered 2011-05-11: 1 mL/kg/h via INTRAVENOUS

## 2011-05-10 MED ORDER — ASPIRIN 81 MG PO CHEW
324.0000 mg | CHEWABLE_TABLET | ORAL | Status: AC
  Administered 2011-05-11: 324 mg via ORAL
  Filled 2011-05-10: qty 4

## 2011-05-10 MED ORDER — ISOSORBIDE MONONITRATE ER 30 MG PO TB24
30.0000 mg | ORAL_TABLET | Freq: Every day | ORAL | Status: DC
  Administered 2011-05-10 – 2011-05-11 (×2): 30 mg via ORAL
  Filled 2011-05-10 (×3): qty 1

## 2011-05-10 MED ORDER — LISINOPRIL 10 MG PO TABS
10.0000 mg | ORAL_TABLET | Freq: Every day | ORAL | Status: DC
  Filled 2011-05-10: qty 1

## 2011-05-10 MED ORDER — INSULIN GLARGINE 100 UNIT/ML ~~LOC~~ SOLN
24.0000 [IU] | Freq: Every day | SUBCUTANEOUS | Status: DC
  Administered 2011-05-10: 12 [IU] via SUBCUTANEOUS

## 2011-05-10 MED ORDER — SODIUM CHLORIDE 0.9 % IV SOLN: 250.0000 mL | INTRAVENOUS | Status: DC | PRN

## 2011-05-10 MED ORDER — INSULIN ASPART 100 UNIT/ML ~~LOC~~ SOLN
8.0000 [IU] | Freq: Three times a day (TID) | SUBCUTANEOUS | Status: DC
  Administered 2011-05-10 – 2011-05-14 (×11): 8 [IU] via SUBCUTANEOUS

## 2011-05-10 MED ORDER — SODIUM CHLORIDE 0.9 % IJ SOLN
3.0000 mL | Freq: Two times a day (BID) | INTRAMUSCULAR | Status: DC
  Administered 2011-05-10: 3 mL via INTRAVENOUS

## 2011-05-10 NOTE — Progress Notes (Signed)
ANTICOAGULATION CONSULT NOTE - Follow Up Consult  Pharmacy Consult for Heparin Indication: ACS/STEMI  No Known Allergies  Patient Measurements: Height: 5\' 11"  (180.3 cm) Weight: 262 lb 9.1 oz (119.1 kg) IBW/kg (Calculated) : 75.3  Heparin Dosing Weight: 100 kg    Vital Signs: Temp: 98.2 F (36.8 C) (02/10 0408) Temp src: Oral (02/10 0408) BP: 160/92 mmHg (02/10 0408) Pulse Rate: 76  (02/10 0408)  Labs:  Basename 05/10/11 0523 05/09/11 0339 05/08/11 2140 05/08/11 2003 05/08/11 1534 05/08/11 0415 05/07/11 2233 05/07/11 1656  HGB 12.8* 12.7* -- -- -- -- -- --  HCT 38.1* 37.6* -- -- -- -- 38.9* --  PLT 213 191 -- -- -- -- 207 --  APTT -- -- -- -- -- -- -- --  LABPROT -- -- -- -- -- -- -- 13.3  INR -- -- -- -- -- -- -- 0.99  HEPARINUNFRC 0.54 0.44 -- 0.14* -- -- -- --  CREATININE 0.80 0.87 -- -- -- 0.80 -- --  CKTOTAL -- 308* 322* -- 316* -- -- --  CKMB -- 4.1* 4.7* -- 5.1* -- -- --  TROPONINI -- <0.30 <0.30 -- <0.30 -- -- --   Estimated Creatinine Clearance: 119.2 ml/min (by C-G formula based on Cr of 0.8).   Medications:  Scheduled:    . aspirin  325 mg Oral Daily  . carvedilol  12.5 mg Oral BID WC  . furosemide  20 mg Oral BID  . insulin aspart  0-15 Units Subcutaneous TID WC  . insulin aspart  0-5 Units Subcutaneous QHS  . insulin aspart  6 Units Subcutaneous TID WC  . insulin glargine  18 Units Subcutaneous QHS  . lisinopril  10 mg Oral Daily  . nicotine  21 mg Transdermal Q24H  . potassium chloride  40 mEq Oral Daily  . rosuvastatin  20 mg Oral q1800  . sodium chloride  3 mL Intravenous Q12H  . DISCONTD: carvedilol  6.25 mg Oral BID WC  . DISCONTD: insulin aspart  3 Units Subcutaneous TID WC  . DISCONTD: insulin glargine  15 Units Subcutaneous QHS  . DISCONTD: lisinopril  5 mg Oral Daily  . DISCONTD: moxifloxacin  400 mg Intravenous Q24H  . DISCONTD: potassium chloride  40 mEq Oral Once   Infusions:    . sodium chloride 10 mL/hr at 05/10/11 0630  .  heparin 1,850 Units/hr (05/09/11 0213)  . nitroGLYCERIN 5 mcg/min (05/08/11 1900)   Anti-infectives     Start     Dose/Rate Route Frequency Ordered Stop   05/07/11 2330   moxifloxacin (AVELOX) IVPB 400 mg  Status:  Discontinued        400 mg 250 mL/hr over 60 Minutes Intravenous Every 24 hours 05/07/11 2229 05/09/11 0952          Assessment: 65 YOM with ACS/STEMI on heparin gtt with therapeutic heparin level (0.54) No issues with Heparin gtt per RN. No bleeding noted. Hgb and platelets stable. Plan to go to cath on Monday.  Goal of Therapy:  Heparin level 0.3-0.7 units/ml   Plan:  1) Continue heparin gtt at 1850 units/hr (18.5 mL/hr) 2) F/U daily heparin level and CBC 3) F/U cath on monday  Doretha Goding, PharmD.  [phone] 05/10/2011,7:39 AM

## 2011-05-10 NOTE — Progress Notes (Signed)
[facility] Progress Note  Subjective:  No Chest pain  Objective:   Vital Signs in the last 24 hours: Temp:  [97.5 F (36.4 C)-98.6 F (37 C)] 98.2 F (36.8 C) (02/10 0408) Pulse Rate:  [72-89] 76  (02/10 0408) Resp:  [18-22] 20  (02/10 0408) BP: (127-175)/(62-102) 160/92 mmHg (02/10 0408) SpO2:  [97 %-100 %] 98 % (02/10 0408) Weight:  [119.1 kg (262 lb 9.1 oz)] 119.1 kg (262 lb 9.1 oz) (02/10 0500)  Intake/Output from previous day: 02/09 0701 - 02/10 0700 In: 2073 [P.O.:1560; I.V.:513] Out: -   Scheduled:   . aspirin  325 mg Oral Daily  . carvedilol  12.5 mg Oral BID WC  . furosemide  20 mg Oral BID  . insulin aspart  0-15 Units Subcutaneous TID WC  . insulin aspart  0-5 Units Subcutaneous QHS  . insulin aspart  6 Units Subcutaneous TID WC  . insulin glargine  18 Units Subcutaneous QHS  . lisinopril  10 mg Oral Daily  . nicotine  21 mg Transdermal Q24H  . potassium chloride  40 mEq Oral Daily  . rosuvastatin  20 mg Oral q1800  . sodium chloride  3 mL Intravenous Q12H  . DISCONTD: carvedilol  6.25 mg Oral BID WC  . DISCONTD: insulin aspart  3 Units Subcutaneous TID WC  . DISCONTD: insulin glargine  15 Units Subcutaneous QHS  . DISCONTD: lisinopril  5 mg Oral Daily  . DISCONTD: moxifloxacin  400 mg Intravenous Q24H  . DISCONTD: potassium chloride  40 mEq Oral Once    Physical Exam:   General appearance: no distress Neck: no adenopathy, no carotid bruit, no JVD, supple, symmetrical, trachea midline and thyroid not enlarged, symmetric, no tenderness/mass/nodules Lungs: clear to auscultation bilaterally Heart: S1, S2 normal and 1/6 sem Abdomen: soft, non-tender; bowel sounds normal; no masses,  no organomegaly Extremities: no edema, redness or tenderness in the calves or thighs Pulses: 2+ and symmetric   Rate: 75  Rhythm: normal sinus rhythm  Lab Results:    Basename 05/10/11 0523 05/09/11 0339  NA 138 137  K 3.6 3.3*  CL 101  101  CO2 26 24  GLUCOSE 226* 203*  BUN 12 10  CREATININE 0.80 0.87    Basename 05/09/11 0339 05/08/11 2140  TROPONINI <0.30 <0.30   Hepatic Function Panel  Basename 05/09/11 0339  PROT 6.0  ALBUMIN 3.3*  AST 26  ALT 33  ALKPHOS 55  BILITOT 0.4  BILIDIR <0.1  IBILI NOT CALCULATED    Basename 05/07/11 1656  INR 0.99    Lipid Panel     Component Value Date/Time   CHOL 227* 05/07/2011 2233   TRIG 176* 05/07/2011 2233   HDL 32* 05/07/2011 2233   CHOLHDL 7.1 05/07/2011 2233   VLDL 35 05/07/2011 2233   LDLCALC 160* 05/07/2011 2233     Imaging:  No results found.    Assessment/Plan:   Principal Problem:  *NSTEMI (non-ST elevated myocardial infarction) Active Problems:  Diabetes mellitus  Pulmonary edema  Pneumonia  HTN (hypertension), malignant  Tobacco abuse  CHF, acute   Feels well.  BP better controlled. Will DC IV NTG and start Imdur 30 mg. Tolerating initiation of Crestor. Target LDL <70. Hydrate for cath tomorrow.  Will hold lisinopril tomorrow am.   Lennette Bihari, MD, [facility] 05/10/2011, 8:20 AM

## 2011-05-10 NOTE — Progress Notes (Signed)
[facility] [place] TEAM 8  Subjective: 66 year old male with history of tobacco abuse, who had not seen a physician in last 6 years presented to [facility] emergency room with acute shortness of breath with sudden onset.  The emergency room workup showed elevated blood pressure 207/126, chest x-ray with pulmonary edema, BNP of 744. Patient was also noted to have leukocytosis with WBC of 13.7, and blood glucose of 412 with anion gap of 19. Patient denies any known history of diabetes mellitus or CHF.  His EKG at the time of his presentation was without evidence of acute changes.  The patient is resting comfortably. He denies chest pain, shortness of breath, fevers, or chills.  He reports he continues to feel much better overall.    Objective:  Intake/Output Summary (Last 24 hours) at 05/10/11 1545 Last data filed at 05/10/11 1000  Gross per 24 hour  Intake   1713 ml  Output    750 ml  Net    963 ml   Blood pressure 134/79, pulse 76, temperature 97.7 F (36.5 C), temperature source Oral, resp. rate 20, height 5\' 11"  (1.803 m), weight 119.1 kg (262 lb 9.1 oz), SpO2 98.00%.  Physical Exam: General: No acute respiratory distress Lungs: Clear to auscultation bilaterally without wheezes or crackles Cardiovascular: Regular rate and rhythm without murmur gallop or rub  Abdomen: Nontender, nondistended, soft, bowel sounds positive, no rebound, no ascites, no appreciable mass Extremities: No significant cyanosis, clubbing, or edema bilateral lower extremities  Lab Results:  Basename 05/10/11 0523 05/09/11 0339 05/08/11 0415  NA 138 137 141  K 3.6 3.3* 3.4*  CL 101 101 104  CO2 26 24 25   GLUCOSE 226* 203* 124*  BUN 12 10 9   CREATININE 0.80 0.87 0.80  CALCIUM 9.0 8.7 8.8  MG -- -- --  PHOS -- -- --    Basename 05/09/11 0339 05/07/11 1656  AST 26 143*  ALT 33 69*  ALKPHOS 55 75  BILITOT 0.4 0.3  PROT 6.0 6.8  ALBUMIN 3.3* 3.6    Basename 05/10/11 0523 05/09/11 0339  05/07/11 2233 05/07/11 1656  WBC 9.6 9.6 10.8* --  NEUTROABS -- -- -- 10.7*  HGB 12.8* 12.7* 13.4 --  HCT 38.1* 37.6* 38.9* --  MCV 84.7 84.1 84.2 --  PLT 213 191 207 --    Basename 05/09/11 0339 05/08/11 2140 05/08/11 1534  CKTOTAL 308* 322* 316*  CKMB 4.1* 4.7* 5.1*  CKMBINDEX -- -- --  TROPONINI <0.30 <0.30 <0.30   Micro Results: Recent Results (from the past 240 hour(s))  MRSA PCR SCREENING     Status: Normal   Collection Time   05/07/11 10:29 PM      Component Value Range Status Comment   MRSA by PCR NEGATIVE  NEGATIVE  Final     Studies/Results: All recent x-ray/radiology reports have been reviewed in detail.   Medications: I have reviewed the patient's complete medication list.  Assessment/Plan:  NSTEMI The patient reportedly never had actual complaints of chest pain - his cardiac enzymes have now essentially normalized - I suspect that he likely suffered a myocardial infarction at the time of his initial symptoms, or shortly before - he remains on med tx - Cardiology is planning to perform a cath tomorrow  Pulmonary Edema/acute diastolic CHF Echo reveals normal systolic fxn w/ an EF of 50-55% - grade 2 diastilic dysfnx was noted, c/w his severe untreated HTN  Malignant HTN BP control improved - follow w/o change today  Newly diagnosed severly uncontrolled DM on ace inhibitor (protein in urine) - A1c markedly elevated at 12.4 - now off insulin gtt - continue to titrate tx regimen, as CBGs are not yet at goal  Tobacco abuse I have counseled him directly as to the absolute need to discontinue smoking entirely - tobacco cessation consultation accomplished  Nodular solid indeterminate opacity in the lingula (9 mm) Will need f/u CT chest to assure has resolved/determine need for further investigation - will postpone to outpt setting due to dye load anticipated w/ cardiac cath   Hypokalemia Diuretic induced - cont to replace   Hypercholesterolemia LDL of 160 -  will begin tx now that LFTs have normalized   Mild transaminitis  Likely passive congestion due to CHF - resolved    Leukocytosis/? B LL infiltrate Likely stress demargination - has normalized today - was placed on avelox at admit - I will d/c and follow clinically   Lonia Blood, MD [facility] Office  [phone] Pager [phone]  On-Call/Text Page:      [email or web address]      password [facility]

## 2011-05-11 ENCOUNTER — Encounter (HOSPITAL_COMMUNITY)
Admission: EM | Disposition: A | Discharge status: Home / Self Care | Payer: Self-pay | Source: Ambulatory Visit | Attending: Internal Medicine

## 2011-05-11 HISTORY — PX: LEFT HEART CATHETERIZATION WITH CORONARY ANGIOGRAM: SHX5451

## 2011-05-11 LAB — GLUCOSE, CAPILLARY
Glucose-Capillary: 198 mg/dL — ABNORMAL HIGH (ref 70–99)
Glucose-Capillary: 191 mg/dL — ABNORMAL HIGH (ref 70–99)
Glucose-Capillary: 219 mg/dL — ABNORMAL HIGH (ref 70–99)
Glucose-Capillary: 112 mg/dL — ABNORMAL HIGH (ref 70–99)

## 2011-05-11 LAB — CBC
Platelets: 212 10*3/uL (ref 150–400)
RBC: 4.56 MIL/uL (ref 4.22–5.81)
WBC: 8.7 10*3/uL (ref 4.0–10.5)

## 2011-05-11 LAB — HEPARIN LEVEL (UNFRACTIONATED): Heparin Unfractionated: 0.59 IU/mL (ref 0.30–0.70)

## 2011-05-11 SURGERY — LEFT HEART CATHETERIZATION WITH CORONARY ANGIOGRAM
Anesthesia: LOCAL

## 2011-05-11 MED ORDER — LISINOPRIL 20 MG PO TABS
20.0000 mg | ORAL_TABLET | Freq: Every day | ORAL | Status: DC
  Filled 2011-05-11: qty 1

## 2011-05-11 MED ORDER — ONDANSETRON HCL 4 MG/2ML IJ SOLN: 4.0000 mg | Freq: Four times a day (QID) | INTRAMUSCULAR | Status: DC | PRN

## 2011-05-11 MED ORDER — FUROSEMIDE 20 MG PO TABS
20.0000 mg | ORAL_TABLET | Freq: Two times a day (BID) | ORAL | Status: DC
  Administered 2011-05-11 – 2011-05-14 (×6): 20 mg via ORAL
  Filled 2011-05-11 (×8): qty 1

## 2011-05-11 MED ORDER — FENTANYL CITRATE 0.05 MG/ML IJ SOLN
INTRAMUSCULAR | Status: AC
  Filled 2011-05-11: qty 2

## 2011-05-11 MED ORDER — LIDOCAINE HCL (PF) 1 % IJ SOLN
INTRAMUSCULAR | Status: AC
  Filled 2011-05-11: qty 30

## 2011-05-11 MED ORDER — SODIUM CHLORIDE 0.9 % IV SOLN
INTRAVENOUS | Status: DC
  Administered 2011-05-11: 14:00:00 via INTRAVENOUS

## 2011-05-11 MED ORDER — MIDAZOLAM HCL 2 MG/2ML IJ SOLN
INTRAMUSCULAR | Status: AC
  Filled 2011-05-11: qty 2

## 2011-05-11 MED ORDER — HEPARIN (PORCINE) IN NACL 2-0.9 UNIT/ML-% IJ SOLN
INTRAMUSCULAR | Status: AC
  Filled 2011-05-11: qty 2000

## 2011-05-11 MED ORDER — LISINOPRIL 20 MG PO TABS
20.0000 mg | ORAL_TABLET | Freq: Every day | ORAL | Status: DC
  Administered 2011-05-12 – 2011-05-14 (×3): 20 mg via ORAL
  Filled 2011-05-11 (×3): qty 1

## 2011-05-11 MED ORDER — INSULIN GLARGINE 100 UNIT/ML ~~LOC~~ SOLN: 30.0000 [IU] | Freq: Every day | SUBCUTANEOUS | Status: DC

## 2011-05-11 MED ORDER — INSULIN GLARGINE 100 UNIT/ML ~~LOC~~ SOLN: 10.0000 [IU] | Freq: Every day | SUBCUTANEOUS | Status: DC

## 2011-05-11 MED ORDER — ACETAMINOPHEN 325 MG PO TABS: 650.0000 mg | ORAL_TABLET | ORAL | Status: DC | PRN

## 2011-05-11 MED ORDER — NITROGLYCERIN 0.2 MG/ML ON CALL CATH LAB
INTRAVENOUS | Status: AC
  Filled 2011-05-11: qty 1

## 2011-05-11 MED ORDER — CARVEDILOL 6.25 MG PO TABS
18.7500 mg | ORAL_TABLET | Freq: Two times a day (BID) | ORAL | Status: DC
  Administered 2011-05-11 – 2011-05-14 (×6): 18.75 mg via ORAL
  Filled 2011-05-11 (×8): qty 1

## 2011-05-11 MED ORDER — INSULIN GLARGINE 100 UNIT/ML ~~LOC~~ SOLN
10.0000 [IU] | SUBCUTANEOUS | Status: AC
  Administered 2011-05-11: 10 [IU] via SUBCUTANEOUS
  Filled 2011-05-11: qty 3

## 2011-05-11 NOTE — Progress Notes (Signed)
[facility] Progress Note  Subjective:  No chest pain  Objective:   Vital Signs in the last 24 hours: Temp:  [97.4 F (36.3 C)-98.3 F (36.8 C)] 97.4 F (36.3 C) (02/11 0759) Pulse Rate:  [68-82] 82  (02/11 0841) Resp:  [20] 20  (02/11 0030) BP: (130-165)/(66-107) 165/98 mmHg (02/11 0841) SpO2:  [97 %-99 %] 98 % (02/11 0759) Weight:  [118.5 kg (261 lb 3.9 oz)] 118.5 kg (261 lb 3.9 oz) (02/11 0500)  Intake/Output from previous day: 02/10 0701 - 02/11 0700 In: 1698.9 [P.O.:840; I.V.:858.9] Out: 2325 [Urine:2325]  Scheduled:   . aspirin  324 mg Oral Pre-Cath  . aspirin  325 mg Oral Daily  . carvedilol  12.5 mg Oral BID WC  . furosemide  20 mg Oral BID  . insulin aspart  0-15 Units Subcutaneous TID WC  . insulin aspart  0-5 Units Subcutaneous QHS  . insulin aspart  8 Units Subcutaneous TID WC  . insulin glargine  24 Units Subcutaneous QHS  . isosorbide mononitrate  30 mg Oral Daily  . lisinopril  10 mg Oral Daily  . nicotine  21 mg Transdermal Q24H  . potassium chloride  40 mEq Oral Daily  . rosuvastatin  20 mg Oral q1800  . sodium chloride  3 mL Intravenous Q12H  . sodium chloride  3 mL Intravenous Q12H  . DISCONTD: insulin aspart  6 Units Subcutaneous TID WC  . DISCONTD: insulin glargine  18 Units Subcutaneous QHS  . DISCONTD: lisinopril  10 mg Oral Daily    Physical Exam:   General appearance: alert, cooperative and no distress Neck: no adenopathy, no carotid bruit, no JVD and supple, symmetrical, trachea midline Lungs: clear to auscultation bilaterally Heart: S1, S2 normal and 1/6 sem Abdomen: soft, non-tender; bowel sounds normal; no masses,  no organomegaly Extremities: trace edema   Rate: 78  Rhythm: normal sinus rhythm and premature atrial contractions (PAC)  Lab Results:    [facility] 05/10/11 0523 05/09/11 0339  NA 138 137  K 3.6 3.3*  CL 101 101  CO2 26 24  GLUCOSE 226* 203*  BUN 12 10  CREATININE 0.80 0.87     Basename 05/09/11 0339 05/08/11 2140  TROPONINI <0.30 <0.30   Hepatic Function Panel  Basename 05/09/11 0339  PROT 6.0  ALBUMIN 3.3*  AST 26  ALT 33  ALKPHOS 55  BILITOT 0.4  BILIDIR <0.1  IBILI NOT CALCULATED   No results found for this basename: INR in the last 72 hours  Lipid Panel     Component Value Date/Time   CHOL 227* 05/07/2011 2233   TRIG 176* 05/07/2011 2233   HDL 32* 05/07/2011 2233   CHOLHDL 7.1 05/07/2011 2233   VLDL 35 05/07/2011 2233   LDLCALC 160* 05/07/2011 2233     Imaging:  No results found.    Assessment/Plan:   Principal Problem:  *NSTEMI (non-ST elevated myocardial infarction) Active Problems:  Diabetes mellitus  Pulmonary edema  Pneumonia  HTN (hypertension), malignant  Tobacco abuse  CHF, acute  Discussed cath with possible PCI; plan this am.    Lennette Bihari, MD, [facility] 05/11/2011, 8:54 AM

## 2011-05-11 NOTE — Progress Notes (Addendum)
Physical Therapy Discharge Summary Patient Name: Miguel Harvey Date: 05/11/2011 Problem List:  Patient Active Problem List  Diagnoses  . TOBACCO DEPENDENCE  . Diabetes mellitus  . Pulmonary edema  . Pneumonia  . HTN (hypertension), malignant  . Tobacco abuse  . CHF, acute  . NSTEMI (non-ST elevated myocardial infarction)   Past Medical History:  Past Medical History  Diagnosis Date  . Hypertension   . Diabetes mellitus   . CHF (congestive heart failure)    Past Surgical History:  Past Surgical History  Procedure Date  . Cyst removal from  right leg about 6 years ago     Referring practitioner: Cathren Harsh, MD Date of initial PT visit: 05/08/2011 Patient seen for 2 sessions  Subjective "I don't need therapy any more.  I am walking with my nurses."   Objective Current condition: Patient ambulating in room independently and with nursing in halls.   Assessment Progress toward previous goals: STG achieved   Plan   Acute Rehab PT Goals PT Goal Formulation: With patient Time For Goal Achievement: 7 days Pt will Ambulate: >150 feet;Independently;Other (comment) (with no dyspnea.) - met  Pt will Go Up / Down Stairs: 3-5 stairs;with modified independence - met  Plan D/C PT Reason for plan status: Patient achieved goals        [facility] Rehabilitation [phone] (pager)

## 2011-05-11 NOTE — Plan of Care (Signed)
Problem: Consults Goal: PCI Patient Education (See Patient Education module for education specifics.)  Outcome: Completed/Met Date Met:  05/11/11 Pt watched video; verbalizes understanding regarding procedure

## 2011-05-11 NOTE — Brief Op Note (Signed)
05/07/2011 - 05/11/2011  10:07 AM  PATIENT:  Miguel Harvey  66 y.o. male  PRE-OPERATIVE DIAGNOSIS:  Dyspnea, Positive troponin  Full note dictated; see diagram   DICTATION # [id number], [id number]  No significant CAD; minimal 10 -20% ostial LAD smooth narrowing. LVH; EF 25% with global reduction. Findings C/W NIC[facility] Medical therapy.  Tolerated well.  Lennette Bihari, MD, [facility] 05/11/2011 10:07 AM

## 2011-05-11 NOTE — Progress Notes (Signed)
[facility] [place] TEAM 8  Subjective:  66 year old male with history of tobacco abuse, who had not seen a physician in last 6 years presented to [facility] emergency room with acute shortness of breath with sudden onset.  The emergency room workup showed elevated blood pressure 207/126, chest x-ray with pulmonary edema, BNP of 744. Patient was also noted to have leukocytosis with WBC of 13.7, and blood glucose of 412 with anion gap of 19. Patient denies any known history of diabetes mellitus or CHF.  His EKG at the time of his presentation was without evidence of acute changes.  Late entry - pt was examined this morning at ~8:30AM.  The patient is resting comfortably. He denies chest pain, shortness of breath, fevers, or chills.    Objective:  Intake/Output Summary (Last 24 hours) at 05/11/11 1659 Last data filed at 05/11/11 1600  Gross per 24 hour  Intake 2562.44 ml  Output   1000 ml  Net 1562.44 ml   Blood pressure 167/84, pulse 75, temperature 97.4 F (36.3 C), temperature source Oral, resp. rate 18, height 5\' 11"  (1.803 m), weight 118.5 kg (261 lb 3.9 oz), SpO2 93.00%.  Physical Exam: General: No acute respiratory distress Lungs: Clear to auscultation bilaterally without wheezes or crackles Cardiovascular: Regular rate and rhythm without murmur gallop or rub  Abdomen: Nontender, nondistended, soft, bowel sounds positive, no rebound, no ascites, no mass Extremities: No significant cyanosis clubbing or edema bilateral lower extremities  Lab Results:  [facility] 05/10/11 0523 05/09/11 0339  NA 138 137  K 3.6 3.3*  CL 101 101  CO2 26 24  GLUCOSE 226* 203*  BUN 12 10  CREATININE 0.80 0.87  CALCIUM 9.0 8.7  MG -- --  PHOS -- --    Basename 05/09/11 0339  AST 26  ALT 33  ALKPHOS 55  BILITOT 0.4  PROT 6.0  ALBUMIN 3.3*    Basename 05/11/11 0555 05/10/11 0523 05/09/11 0339  WBC 8.7 9.6 9.6  NEUTROABS -- -- --  HGB 12.9* 12.8* 12.7*  HCT 39.2 38.1* 37.6*    MCV 86.0 84.7 84.1  PLT 212 213 191    Basename 05/09/11 0339 05/08/11 2140  CKTOTAL 308* 322*  CKMB 4.1* 4.7*  CKMBINDEX -- --  TROPONINI <0.30 <0.30   Micro Results: Recent Results (from the past 240 hour(s))  MRSA PCR SCREENING     Status: Normal   Collection Time   05/07/11 10:29 PM      Component Value Range Status Comment   MRSA by PCR NEGATIVE  NEGATIVE  Final     Studies/Results: All recent x-ray/radiology reports have been reviewed in detail.   Medications: I have reviewed the patient's complete medication list.  Assessment/Plan:  NSTEMI Cardiology is planning a cath today   Pulmonary Edema/acute diastolic CHF Echo reveals normal systolic fxn w/ an EF of 50-55% - grade 2 diastilic dysfnx was noted, c/w his severe untreated HTN  Malignant HTN BP control improved  Newly diagnosed severly uncontrolled DM on ace inhibitor (protein in urine) - A1c markedly elevated at 12.4 - now off insulin gtt - continue to titrate tx regimen, as CBGs are still not yet at goal  Tobacco abuse I have counseled him directly as to the absolute need to discontinue smoking entirely - tobacco cessation consultation accomplished  Nodular solid indeterminate opacity in the lingula (9 mm) Will need f/u CT chest to assure has resolved/determine need for further investigation - will postpone to outpt setting due to  dye load anticipated w/ cardiac cath   Hypokalemia Diuretic induced - improved - cont to replace   Hypercholesterolemia LDL of 160 - will begin tx now that LFTs have normalized   Mild transaminitis  Likely passive congestion due to CHF - resolved    Leukocytosis/? B LL infiltrate Likely stress demargination - has normalized    Disposition Possible transfer out of SDU Tues, depending on results of cardiac cath  Lonia Blood, MD [facility] Office  [phone] Pager [phone]  On-Call/Text Page:      [email or web address]      password [facility]

## 2011-05-11 NOTE — Progress Notes (Signed)
PT Cancellation Note  Treatment cancelled today due to patient receiving procedure or test.  PAtient in cath this am at 1000 when PT checked on pt.  Nursing states that patient is moving well and may no longer need PT.  Will check back as able once patient taken off bedrest.  Thanks.   [name],[name] 05/11/2011, 12:31 PM [facility] [phone] (pager)

## 2011-05-12 DIAGNOSIS — I5031 Acute diastolic (congestive) heart failure: Secondary | ICD-10-CM | POA: Diagnosis present

## 2011-05-12 DIAGNOSIS — E1169 Type 2 diabetes mellitus with other specified complication: Secondary | ICD-10-CM | POA: Diagnosis present

## 2011-05-12 DIAGNOSIS — Z8249 Family history of ischemic heart disease and other diseases of the circulatory system: Secondary | ICD-10-CM

## 2011-05-12 DIAGNOSIS — E785 Hyperlipidemia, unspecified: Secondary | ICD-10-CM | POA: Diagnosis present

## 2011-05-12 DIAGNOSIS — I428 Other cardiomyopathies: Secondary | ICD-10-CM | POA: Diagnosis present

## 2011-05-12 LAB — BASIC METABOLIC PANEL
CO2: 25 mEq/L (ref 19–32)
Calcium: 9.1 mg/dL (ref 8.4–10.5)
Chloride: 103 mEq/L (ref 96–112)
Glucose, Bld: 180 mg/dL — ABNORMAL HIGH (ref 70–99)
Sodium: 139 mEq/L (ref 135–145)

## 2011-05-12 LAB — HEPARIN LEVEL (UNFRACTIONATED): Heparin Unfractionated: <0.1 IU/mL — ABNORMAL LOW (ref 0.30–0.70)

## 2011-05-12 LAB — GLUCOSE, CAPILLARY
Glucose-Capillary: 161 mg/dL — ABNORMAL HIGH (ref 70–99)
Glucose-Capillary: 188 mg/dL — ABNORMAL HIGH (ref 70–99)
Glucose-Capillary: 249 mg/dL — ABNORMAL HIGH (ref 70–99)
Glucose-Capillary: 156 mg/dL — ABNORMAL HIGH (ref 70–99)

## 2011-05-12 MED ORDER — POTASSIUM CHLORIDE CRYS ER 20 MEQ PO TBCR
20.0000 meq | EXTENDED_RELEASE_TABLET | Freq: Every day | ORAL | Status: DC
  Administered 2011-05-12 – 2011-05-14 (×3): 20 meq via ORAL
  Filled 2011-05-12 (×2): qty 1

## 2011-05-12 MED ORDER — INSULIN GLARGINE 100 UNIT/ML ~~LOC~~ SOLN
34.0000 [IU] | Freq: Every day | SUBCUTANEOUS | Status: DC
  Administered 2011-05-12 – 2011-05-13 (×2): 34 [IU] via SUBCUTANEOUS

## 2011-05-12 NOTE — Progress Notes (Signed)
*  PRELIMINARY RESULTS* Echocardiogram 2D Echocardiogram has been performed.  Miguel Harvey [facility] 05/12/2011, 12:33 PM

## 2011-05-12 NOTE — Consult Note (Signed)
Second consult for pt this admission. Discussed and reinforced prior discussion on quitting.

## 2011-05-12 NOTE — Progress Notes (Signed)
[facility] [place] TEAM 8  Subjective:  66 year old male with history of tobacco abuse, who had not seen a physician in last 6 years presented to [facility] emergency room with acute shortness of breath with sudden onset.  The emergency room workup showed elevated blood pressure 207/126, chest x-ray with pulmonary edema, BNP of 744. Patient was also noted to have leukocytosis with WBC of 13.7, and blood glucose of 412 with anion gap of 19. Patient denies any known history of diabetes mellitus or CHF.  His EKG at the time of his presentation was without evidence of acute changes.  He underwent a cardiac cath yesterday which revealed a depressed EF and minimal coronary disease. He states he feels good. He is adamant that he will start taking better care of himself and understands the need to stop smoking, lose weight, take medications appropriately and f/u with a PCP regularly.  I have spoken with his daughter as well who states that she is worried that he will not be very compliant but she will keep a close eye on him.   Objective:  Intake/Output Summary (Last 24 hours) at 05/12/11 1827 Last data filed at 05/12/11 1700  Gross per 24 hour  Intake   3030 ml  Output      0 ml  Net   3030 ml   Blood pressure 146/76, pulse 69, temperature 97.8 F (36.6 C), temperature source Oral, resp. rate 18, height 5\' 11"  (1.803 m), weight 117.8 kg (259 lb 11.2 oz), SpO2 96.00%.  Physical Exam: General: No acute respiratory distress Lungs: Clear to auscultation bilaterally without wheezes or crackles Cardiovascular: Regular rate and rhythm without murmur gallop or rub  Abdomen: Nontender, nondistended, soft, bowel sounds positive, no rebound, no ascites, no mass Extremities: 1+ edema bilaterally  Lab Results:  Basename 05/12/11 0525 05/10/11 0523  NA 139 138  K 4.2 3.6  CL 103 101  CO2 25 26  GLUCOSE 180* 226*  BUN 11 12  CREATININE 0.85 0.80  CALCIUM 9.1 9.0  MG -- --  PHOS -- --    No results found for this basename: AST:2,ALT:2,ALKPHOS:2,BILITOT:2,PROT:2,ALBUMIN:2 in the last 72 hours  Basename 05/11/11 0555 05/10/11 0523  WBC 8.7 9.6  NEUTROABS -- --  HGB 12.9* 12.8*  HCT 39.2 38.1*  MCV 86.0 84.7  PLT 212 213   No results found for this basename: CKTOTAL:3,CKMB:3,CKMBINDEX:3,TROPONINI:3 in the last 72 hours Micro Results: Recent Results (from the past 240 hour(s))  MRSA PCR SCREENING     Status: Normal   Collection Time   05/07/11 10:29 PM      Component Value Range Status Comment   MRSA by PCR NEGATIVE  NEGATIVE  Final     Studies/Results: All recent x-ray/radiology reports have been reviewed in detail.   Medications: I have reviewed the patient's complete medication list.  Assessment/Plan:  NSTEMI? Doubtful as Cath does not reveal significant CAD.  ECHO will be repeated due to noted systolic dysfunction noted on cath.   Pulmonary Edema/acute diastolic CHF Echo reveals normal systolic fxn w/ an EF of 50-55% - grade 2 diastilic dysfnx was noted, c/w his severe untreated HTN  Malignant HTN BP control improved  Newly diagnosed severly uncontrolled DM on ace inhibitor (protein in urine) - A1c markedly elevated at 12.4 - now off insulin gtt - continue to titrate tx regimen, as CBGs are still not yet at goal- will increase Lantus to 34 units.  - he has received diabetes teaching and has been  injecting himself with insulin  Tobacco abuse I have counseled him directly as to the absolute need to discontinue smoking entirely - tobacco cessation consultation accomplished  Nodular solid indeterminate opacity in the lingula (9 mm) Will need f/u CT chest to assure has resolved/determine need for further investigation - will postpone to outpt setting due to dye load anticipated w/ cardiac cath   Hypokalemia Diuretic induced - improved - cont to replace   Hypercholesterolemia LDL of 160 - on Crestor  Mild transaminitis  Likely passive congestion due  to CHF - resolved    Leukocytosis/? B LL infiltrate Likely stress demargination - has normalized    Disposition  transfer out of SDU today- home in next 1-2 days- he will benefit from [facility] RN to follow at home in regards to CHF, daily weights, monitoring appropriate medication administration and continued teaching.    Calvert Cantor, MD [facility] Office  [phone] Pager [phone]  On-Call/Text Page:      [email or web address]      password [facility]

## 2011-05-12 NOTE — Progress Notes (Signed)
[facility]  DAILY PROGRESS NOTE   Subjective:  Feels much better today. Not short of breath. No chest pain.  Cath showed essentially normal coronaries, but LVEF 25%, global HK. EF was 55% 3 days ago, however, by echo.  Objective:  Temp:  [97.4 F (36.3 C)-98.6 F (37 C)] 98 F (36.7 C) (02/12 0818) Pulse Rate:  [47-80] 76  (02/12 0818) Resp:  [18-20] 18  (02/12 0000) BP: (97-171)/(50-109) 162/92 mmHg (02/12 0818) SpO2:  [92 %-97 %] 97 % (02/12 0818) Weight:  [117.8 kg (259 lb 11.2 oz)] 117.8 kg (259 lb 11.2 oz) (02/12 0500) Weight change: -0.7 kg (-1 lb 8.7 oz)  Intake/Output from previous day: 02/11 0701 - 02/12 0700 In: 3009 [P.O.:1440; I.V.:1569] Out: -   Intake/Output from this shift:    Medications: Current Facility-Administered Medications  Medication Dose Route Frequency Provider Last Rate Last Dose  . 0.9 %  sodium chloride infusion  250 mL Intravenous PRN Ripudeep K Rai, MD      . 0.9 %  sodium chloride infusion   Intravenous Continuous Lennette Bihari, MD 50 mL/hr at 05/11/11 2300    . acetaminophen (TYLENOL) tablet 650 mg  650 mg Oral Q4H PRN Ripudeep K Rai, MD      . acetaminophen (TYLENOL) tablet 650 mg  650 mg Oral Q4H PRN Lennette Bihari, MD      . aspirin tablet 325 mg  325 mg Oral Daily Dwana Melena, PA   325 mg at 05/11/11 1450  . carvedilol (COREG) tablet 18.75 mg  18.75 mg Oral BID WC Lennette Bihari, MD   18.75 mg at 05/12/11 0830  . dextrose 50 % solution 25 mL  25 mL Intravenous PRN Ripudeep K Rai, MD      . fentaNYL (SUBLIMAZE) 0.05 MG/ML injection           . furosemide (LASIX) tablet 20 mg  20 mg Oral BID Lonia Blood, MD   20 mg at 05/12/11 0830  . heparin 2-0.9 UNIT/ML-% infusion           . insulin aspart (novoLOG) injection 0-15 Units  0-15 Units Subcutaneous TID WC Lonia Blood, MD   3 Units at 05/12/11 0831  . insulin aspart (novoLOG) injection 0-5 Units  0-5 Units Subcutaneous QHS Lonia Blood, MD    2 Units at 05/10/11 2232  . insulin aspart (novoLOG) injection 8 Units  8 Units Subcutaneous TID WC Lonia Blood, MD   8 Units at 05/12/11 0830  . insulin glargine (LANTUS) injection 10 Units  10 Units Subcutaneous NOW Maren Reamer, NP   10 Units at 05/11/11 2225  . insulin glargine (LANTUS) injection 30 Units  30 Units Subcutaneous QHS Lonia Blood, MD      . isosorbide mononitrate (IMDUR) 24 hr tablet 30 mg  30 mg Oral Daily Lennette Bihari, MD   30 mg at 05/11/11 1453  . levalbuterol (XOPENEX) nebulizer solution 0.63 mg  0.63 mg Nebulization Q6H PRN Provider Default, MD   0.63 mg at 05/07/11 2359  . lidocaine (XYLOCAINE) 1 % injection           . lisinopril (PRINIVIL,ZESTRIL) tablet 20 mg  20 mg Oral Daily Lonia Blood, MD      . midazolam (VERSED) 2 MG/2ML injection           . nicotine (NICODERM CQ - dosed in mg/24 hours) patch 21 mg  21 mg  Transdermal Q24H Ripudeep Jenna Luo, MD   21 mg at 05/11/11 2214  . nitroGLYCERIN (NITROSTAT) SL tablet 0.4 mg  0.4 mg Sublingual Q5 Min x 3 PRN Warnell Forester, MD      . nitroGLYCERIN (NTG ON-CALL) 0.2 mg/mL injection           . ondansetron (ZOFRAN) injection 4 mg  4 mg Intravenous Q6H PRN Lennette Bihari, MD      . potassium chloride SA (K-DUR,KLOR-CON) CR tablet 40 mEq  40 mEq Oral Daily Lonia Blood, MD   40 mEq at 05/11/11 1453  . rosuvastatin (CRESTOR) tablet 20 mg  20 mg Oral q1800 Dwana Melena, PA   20 mg at 05/11/11 1752  . sodium chloride 0.9 % injection 3 mL  3 mL Intravenous Q12H Ripudeep K Rai, MD   3 mL at 05/11/11 1415  . sodium chloride 0.9 % injection 3 mL  3 mL Intravenous PRN Ripudeep Jenna Luo, MD      . DISCONTD: 0.9 %  sodium chloride infusion   Intravenous Continuous Ripudeep Jenna Luo, MD 10 mL/hr at 05/10/11 0630    . DISCONTD: 0.9 %  sodium chloride infusion  250 mL Intravenous PRN Dwana Melena, PA      . DISCONTD: 0.9 %  sodium chloride infusion  1 mL/kg/hr Intravenous Continuous Dwana Melena, PA 119.1 mL/hr at 05/11/11  0349 1 mL/kg/hr at 05/11/11 0349  . DISCONTD: carvedilol (COREG) tablet 12.5 mg  12.5 mg Oral BID WC Dwana Melena, PA   12.5 mg at 05/11/11 0841  . DISCONTD: furosemide (LASIX) tablet 20 mg  20 mg Oral BID Lonia Blood, MD   20 mg at 05/11/11 1452  . DISCONTD: heparin ADULT infusion 100 units/ml (25000 units/250 ml)  1,850 Units/hr Intravenous Continuous Lennon Alstrom, PHARMD 18.5 mL/hr at 05/10/11 2231 1,850 Units/hr at 05/10/11 2231  . DISCONTD: insulin glargine (LANTUS) injection 10 Units  10 Units Subcutaneous QHS Maren Reamer, NP      . DISCONTD: insulin glargine (LANTUS) injection 24 Units  24 Units Subcutaneous QHS Lonia Blood, MD   12 Units at 05/10/11 2232  . DISCONTD: lisinopril (PRINIVIL,ZESTRIL) tablet 10 mg  10 mg Oral Daily Dwana Melena, PA      . DISCONTD: lisinopril (PRINIVIL,ZESTRIL) tablet 20 mg  20 mg Oral Daily Lennette Bihari, MD      . DISCONTD: ondansetron [facility]) injection 4 mg  4 mg Intravenous Q6H PRN Ripudeep K Rai, MD      . DISCONTD: sodium chloride 0.9 % injection 3 mL  3 mL Intravenous Q12H Dwana Melena, PA   3 mL at 05/10/11 2233  . DISCONTD: sodium chloride 0.9 % injection 3 mL  3 mL Intravenous PRN Dwana Melena, PA        Physical Exam: General appearance: alert and no distress Neck: JVD - 3 cm above sternal notch, no adenopathy, no carotid bruit, supple, symmetrical, trachea midline and thyroid not enlarged, symmetric, no tenderness/mass/nodules Lungs: clear to auscultation bilaterally Heart: regular rate and rhythm, S1, S2 normal, no murmur, click, rub or gallop Abdomen: soft, non-tender; bowel sounds normal; no masses,  no organomegaly Extremities: extremities normal, atraumatic, no cyanosis or edema and groin without hematoma or bruit  Lab Results: Results for orders placed during the hospital encounter of 05/07/11 (from the past 48 hour(s))  GLUCOSE, CAPILLARY     Status: Abnormal   Collection Time   05/10/11 12:53 PM  Component Value  Range Comment   Glucose-Capillary 177 (*) 70 - 99 (mg/dL)   GLUCOSE, CAPILLARY     Status: Abnormal   Collection Time   05/10/11  6:26 PM      Component Value Range Comment   Glucose-Capillary 231 (*) 70 - 99 (mg/dL)   GLUCOSE, CAPILLARY     Status: Abnormal   Collection Time   05/10/11 10:03 PM      Component Value Range Comment   Glucose-Capillary 202 (*) 70 - 99 (mg/dL)   HEPARIN LEVEL (UNFRACTIONATED)     Status: Normal   Collection Time   05/11/11  5:55 AM      Component Value Range Comment   Heparin Unfractionated 0.59  0.30 - 0.70 (IU/mL)   CBC     Status: Abnormal   Collection Time   05/11/11  5:55 AM      Component Value Range Comment   WBC 8.7  4.0 - 10.5 (K/uL)    RBC 4.56  4.22 - 5.81 (MIL/uL)    Hemoglobin 12.9 (*) 13.0 - 17.0 (g/dL)    HCT 16.1  09.6 - 04.5 (%)    MCV 86.0  78.0 - 100.0 (fL)    MCH 28.3  26.0 - 34.0 (pg)    MCHC 32.9  30.0 - 36.0 (g/dL)    RDW 40.9  81.1 - 91.4 (%)    Platelets 212  150 - 400 (K/uL)   GLUCOSE, CAPILLARY     Status: Abnormal   Collection Time   05/11/11  8:00 AM      Component Value Range Comment   Glucose-Capillary 198 (*) 70 - 99 (mg/dL)   GLUCOSE, CAPILLARY     Status: Abnormal   Collection Time   05/11/11 10:07 AM      Component Value Range Comment   Glucose-Capillary 218 (*) 70 - 99 (mg/dL)   GLUCOSE, CAPILLARY     Status: Abnormal   Collection Time   05/11/11 12:11 PM      Component Value Range Comment   Glucose-Capillary 191 (*) 70 - 99 (mg/dL)   GLUCOSE, CAPILLARY     Status: Abnormal   Collection Time   05/11/11  5:06 PM      Component Value Range Comment   Glucose-Capillary 219 (*) 70 - 99 (mg/dL)   GLUCOSE, CAPILLARY     Status: Abnormal   Collection Time   05/11/11  9:46 PM      Component Value Range Comment   Glucose-Capillary 112 (*) 70 - 99 (mg/dL)    Comment 1 Notify RN     GLUCOSE, CAPILLARY     Status: Abnormal   Collection Time   05/12/11  2:23 AM      Component Value Range Comment   Glucose-Capillary  161 (*) 70 - 99 (mg/dL)   HEPARIN LEVEL (UNFRACTIONATED)     Status: Abnormal   Collection Time   05/12/11  5:25 AM      Component Value Range Comment   Heparin Unfractionated <0.10 (*) 0.30 - 0.70 (IU/mL)   BASIC METABOLIC PANEL     Status: Abnormal   Collection Time   05/12/11  5:25 AM      Component Value Range Comment   Sodium 139  135 - 145 (mEq/L)    Potassium 4.2  3.5 - 5.1 (mEq/L)    Chloride 103  96 - 112 (mEq/L)    CO2 25  19 - 32 (mEq/L)    Glucose, Bld 180 (*) 70 -  99 (mg/dL)    BUN 11  6 - 23 (mg/dL)    Creatinine, Ser 9.14  0.50 - 1.35 (mg/dL)    Calcium 9.1  8.4 - 10.5 (mg/dL)    GFR calc non Af Amer 89 (*) >90 (mL/min)    GFR calc Af Amer >90  >90 (mL/min)     Imaging: No results found.  Assessment:  1. Principal Problem: 2.  *NSTEMI, ?? one of 5 Troponins 4.0, others negative 3. Active Problems: 4.  Diabetes mellitus 5.  HTN (hypertension), malignant 6.  Acute diastolic heart failure, 2D EF 78-29% with grade 2 diastloic dysfunction 7.  Family history of early CAD, brother died 78 CAD 8.  Dyslipidemia 9.  NICM, minor CAD at cath, EF 25% at cath, 55% by 2D 10.   Plan:  1. I suspect the elevated troponin was a spurious result. It is impossible to have an troponin pattern without any typical rise or fall.  I suspect initially he presented with hypertensive diastolic heart failure, however, cath demonstrates reduced EF of 25%.  I reviewed the films, ?whether there was catheter induced VT during the LV gram - catheter is up against the inferior wall of the LV.   Will plan to repeat limited echo today to re-assess the LVEF.  If it is reduced, this may represent acute hypertensive cardiomyopathy or possibly Takatsubo cardiomyopathy.  Will continue with current heart failure regimen. Titrate medication for improved BP control.  Discontinue heparin.   Time Spent Directly with Patient:  30 minutes  Length of Stay:  LOS: 5 days   Chrystie Nose, MD Attending  Cardiologist [facility]  Oz Gammel C 05/12/2011, 8:46 AM

## 2011-05-12 NOTE — Cardiovascular Report (Signed)
NAME:  FOREST, REDWINE NO.:  [id number]  MEDICAL RECORD NO.:  [id number]  LOCATION:  2916                         FACILITY:  [facility]  PHYSICIAN:  Nicki Guadalajara, M.D.     DATE OF BIRTH:  [birth year]  DATE OF PROCEDURE:  05/11/2011 DATE OF DISCHARGE:                           CARDIAC CATHETERIZATION   INDICATIONS:  Mr. Rojelio Uhrich is a 66 year old African American gentleman who has a longstanding history of hypertension as well as diabetes mellitus.  In addition, he has a history of 7-year history of tobacco.  The patient was admitted to [facility] on May 06, 2001, after experiencing an increase in shortness of breath.  He denied any chest pain.  Initial troponin was positive at 4, but subsequent troponins have been negative.  He has been treated with medical therapy. His cholesterol was 227, LDL 160.  He now presents for cardiac catheterizations.  PROCEDURE:  After premedication with Versed 2 mg plus fentanyl 50 mcg intravenously, the patient was prepped and draped in usual fashion.  His right femoral artery was punctured anteriorly and a 5-French sheath was inserted without difficulty.  Diagnostic catheterization was done utilizing 5-French Judkins 4 left and right coronary catheters.  A 5- French pigtail catheter was used for left ventriculography.  With the patient's significant hypertensive history, distal aortography was also performed to make certain he did not have any renovascular etiology. Hemostasis was obtained by direct manual pressure.  The patient tolerated the procedure well.  HEMODYNAMIC DATA:  Central aortic pressure was 135/76, left ventricular pressure 135/23, post A-wave 33.  ANGIOGRAPHIC DATA:  Left main coronary artery was a large-caliber normal vessel, which bifurcated into LAD and left circumflex system.  The LAD had smooth 10-20% ostial narrowing.  The LAD extended to and wrapped around the LV apex and gave rise to 3 diagonal  vessels and several septal perforating arteries.  The LAD was otherwise free of significant disease.  The circumflex vessel was angiographically normal vessel, which essentially trifurcated approximately in to 2 marginal branches and an AV groove limb.  The right coronary artery is a large-caliber dominant vessel that supplied a moderate size PDA and small PLA system.  RAO ventriculography revealed severe diffuse global hypocontractility with an ejection fraction of approximately 25%.  There was evidence for LVH. Distal aortography did not reveal any significant aortoiliac disease. There was no evidence for renal artery stenosis.  IMPRESSION: 1. Nonischemic cardiomyopathy with ejection fraction approximately 25%     with global reduction of left ventricular function. 2. Essentially normal coronary arteries with minimal 10-20% smooth     narrowing of the ostium of the left anterior descending.  RECOMMENDATION:  Increasing medical therapy.  The patient ultimately will need reassessment of LV function to see if LV function improves and if not, he may be a candidate for prophylactic ICD implantation if necessary.          ______________________________ Nicki Guadalajara, M.D.     TK/MEDQ  D:  05/11/2011  T:  05/12/2011  Job:  [id number]

## 2011-05-12 NOTE — Progress Notes (Signed)
CARDIAC REHAB PHASE I   PRE:  Rate/Rhythm: 71 SR    BP: sitting 156/94    SaO2:   MODE:  Ambulation: 530ft   POST:  Rate/Rhythm: 96 SR    BP: sitting 199/98, 5 min later 111/80    SaO2:   Tolerated well, assist x1. Some SOB toward end of walk. BP elevated. Took again and 111/80. Not sure of accuracy. To recliner. Pt likes to "relax". Encouraged more walking.  [id number]  Harriet Masson CES, ACSM

## 2011-05-12 NOTE — Progress Notes (Signed)
Subjective:  SOB improved  Objective:  Vital Signs in the last 24 hours: Temp:  [97.4 F (36.3 C)-98.6 F (37 C)] 98 F (36.7 C) (02/12 0818) Pulse Rate:  [47-80] 76  (02/12 0818) Resp:  [18-20] 18  (02/12 0000) BP: (97-171)/(50-109) 162/92 mmHg (02/12 0818) SpO2:  [92 %-97 %] 97 % (02/12 0818) Weight:  [117.8 kg (259 lb 11.2 oz)] 117.8 kg (259 lb 11.2 oz) (02/12 0500)  Intake/Output from previous day:  Intake/Output Summary (Last 24 hours) at 05/12/11 0843 Last data filed at 05/12/11 0600  Gross per 24 hour  Intake   2890 ml  Output      0 ml  Net   2890 ml    Physical Exam: General appearance: alert, cooperative and no distress Lungs: clear to auscultation bilaterally Heart: regular rate and rhythm   Rate: 76  Rhythm: normal sinus rhythm  Lab Results:  Basename 05/11/11 0555 05/10/11 0523  WBC 8.7 9.6  HGB 12.9* 12.8*  PLT 212 213    Basename 05/12/11 0525 05/10/11 0523  NA 139 138  K 4.2 3.6  CL 103 101  CO2 25 26  GLUCOSE 180* 226*  BUN 11 12  CREATININE 0.85 0.80   No results found for this basename: TROPONINI:2,CK,MB:2 in the last 72 hours Hepatic Function Panel No results found for this basename: PROT,ALBUMIN,AST,ALT,ALKPHOS,BILITOT,BILIDIR,IBILI in the last 72 hours No results found for this basename: CHOL in the last 72 hours No results found for this basename: INR in the last 72 hours  Imaging: No results found.  Cardiac Studies:  Assessment/Plan:   Principal Problem:  *NSTEMI, ?? one of 5 Troponins 4.0, others negative  Active Problems:  Acute diastolic heart failure, 2D EF 40-98% with grade 2 diastloic dysfunction  NICM, minor CAD at cath, EF 25% at cath, 55% by 2D  Diabetes mellitus  HTN (hypertension), malignant  Family history of early CAD, brother died 10 CAD  Dyslipidemia  Plan-MD to review, ? EF discrepancy. D/C nitrates, push ACE, decrease K+    Corine Shelter PA-C 05/12/2011, 8:43 AM

## 2011-05-12 NOTE — Plan of Care (Signed)
Problem: Food- and Nutrition-Related Knowledge Deficit (NB-1.1) Goal: Nutrition education Formal process to instruct or train a patient/client in a skill or to impart knowledge to help patients/clients voluntarily manage or modify food choices and eating behavior to maintain or improve health.  Outcome: Completed/Met Date Met:  05/12/11 RD consulted for DM education. Pt with newly dx DM. HgbA1c of 12.4. Pt states he has already received some education prior to this RD visit by other staff members. Provided and discussed CHO Counting handout and Balanced Plate eating method. Dietary recall reveals doughnuts and other processed foods. Discussed importance of controlled and consistent CHO intake throughout the day and avoid skipping meals. Pt asked appropriate questions. Goals met.

## 2011-05-13 LAB — BASIC METABOLIC PANEL
BUN: 10 mg/dL (ref 6–23)
Calcium: 9.1 mg/dL (ref 8.4–10.5)
Creatinine, Ser: 0.84 mg/dL (ref 0.50–1.35)
GFR calc Af Amer: >90 mL/min (ref >90)
GFR calc non Af Amer: 89 mL/min — ABNORMAL LOW (ref >90)

## 2011-05-13 LAB — GLUCOSE, CAPILLARY

## 2011-05-13 MED ORDER — ZOLPIDEM TARTRATE 5 MG PO TABS
5.0000 mg | ORAL_TABLET | Freq: Every evening | ORAL | Status: DC | PRN
  Administered 2011-05-13: 5 mg via ORAL
  Filled 2011-05-13: qty 1

## 2011-05-13 NOTE — Progress Notes (Signed)
05/13/2011 Miguel Harvey SPARKS Case Management Note [phone]  Utilization review completed.  

## 2011-05-13 NOTE — Progress Notes (Signed)
Subjective: Feels much better. No new issues.  Objective: Vital signs in last 24 hours: Temp:  [97.3 F (36.3 C)-98.6 F (37 C)] 97.3 F (36.3 C) (02/13 1006) Pulse Rate:  [68-82] 72  (02/13 1006) Resp:  [16] 16  (02/13 0040) BP: (134-172)/(72-101) 149/86 mmHg (02/13 1006) SpO2:  [95 %-98 %] 98 % (02/13 1006) Weight:  [116.6 kg (257 lb 0.9 oz)] 116.6 kg (257 lb 0.9 oz) (02/13 0040) Weight change: -1.2 kg (-2 lb 10.3 oz) Last BM Date: 05/13/11  Intake/Output from previous day: 02/12 0701 - 02/13 0700 In: 2110 [P.O.:1560; I.V.:550] Out: 180 [Urine:180] Total I/O In: 360 [P.O.:360] Out: 225 [Urine:225]   Physical Exam: General: Sitting in chair, comfortable, alert, communicative, fully oriented, not short of breath at rest.  HEENT:  No clinical pallor, no jaundice, no conjunctival injection or discharge. Hydration status is fair. NECK:  Supple, JVP not seen, no carotid bruits, no palpable lymphadenopathy, no palpable goiter. CHEST:  Clinically clear to auscultation, no wheezes, no crackles. HEART:  Sounds 1 and 2 heard, normal, regular, no murmurs. ABDOMEN:  Moderately obese, full, soft, non-tender, no palpable organomegaly, no palpable masses, normal bowel sounds. GENITALIA:  Not examined. LOWER EXTREMITIES:  No pitting edema, palpable peripheral pulses. MUSCULOSKELETAL SYSTEM:  Generalized osteoarthritic changes, otherwise, normal. CENTRAL NERVOUS SYSTEM:  No focal neurologic deficit on gross examination.  Lab Results:  [facility] 05/11/11 0555  WBC 8.7  HGB 12.9*  HCT 39.2  PLT 212    Basename 05/13/11 0655 05/12/11 0525  NA 140 139  K 3.9 4.2  CL 105 103  CO2 24 25  GLUCOSE 146* 180*  BUN 10 11  CREATININE 0.84 0.85  CALCIUM 9.1 9.1   Recent Results (from the past 240 hour(s))  MRSA PCR SCREENING     Status: Normal   Collection Time   05/07/11 10:29 PM      Component Value Range Status Comment   MRSA by PCR NEGATIVE  NEGATIVE  Final       Studies/Results: No results found.  Medications: Scheduled Meds:   . aspirin  325 mg Oral Daily  . carvedilol  18.75 mg Oral BID WC  . furosemide  20 mg Oral BID  . insulin aspart  0-15 Units Subcutaneous TID WC  . insulin aspart  0-5 Units Subcutaneous QHS  . insulin aspart  8 Units Subcutaneous TID WC  . insulin glargine  34 Units Subcutaneous QHS  . lisinopril  20 mg Oral Daily  . nicotine  21 mg Transdermal Q24H  . potassium chloride  20 mEq Oral Daily  . rosuvastatin  20 mg Oral q1800  . sodium chloride  3 mL Intravenous Q12H  . DISCONTD: insulin glargine  30 Units Subcutaneous QHS   Continuous Infusions:   . sodium chloride Stopped (05/12/11 1800)   PRN Meds:.acetaminophen, acetaminophen, dextrose, levalbuterol, nitroGLYCERIN, ondansetron (ZOFRAN) IV, sodium chloride, DISCONTD: sodium chloride  Assessment/Plan:  Active Problems:  1. Diabetes mellitus: New diagnosis, complicated by DKA. Managed initially with ivi insulin, per gluco-stabilizer protocol. Now off insulin drip, transitioned to SSI, diet, meal cover and Lantus to 34 units.  He has received diabetes teaching and instructed in insulin self-administration. On ace inhibitor (protein in urine). A1c markedly elevated at 12.4 -   2. HTN (hypertension), malignant: Improved control on ACE-i, Coreg and Diuretics. Will need further adjustment of anti-hypertensives. I favor increasing Coreg, if repeat Echocardiogram confirms diastolic dysfunction.  3. Acute diastolic heart failure/NICM, minor CAD at cath, EF 25% at  cath on 05/12/11. Of note,  2D Echcardiogram of 05/08/11, showed EF 50-55% with grade 2 diastloic dysfunction. Dr Rennis Golden planns a limited study, to re-assess LVEF and eval for possibility of acute hypertensive cardiomyopathy or possibly Takatsubo cardiomyopathy. 4. Dyslipidemia: On Crestor. LDL is 160. 5. Nodular solid indeterminate opacity in the lingula (9 mm): Will need f/u CT chest to assure has  resolved/determine need for further investigation. 6. Smoker: Counseled appropriately. 7. Transaminitis: This was transient, secondary to congestive hepatopathy from CHF.        LOS: 6 days   Mujtaba Bollig,CHRISTOPHER 05/13/2011, 11:13 AM

## 2011-05-13 NOTE — Progress Notes (Signed)
Inpatient Diabetes Program Recommendations  AACE/ADA: New Consensus Statement on Inpatient Glycemic Control (2009)  Target Ranges:  Prepandial:   less than 140 mg/dL      Peak postprandial:   less than 180 mg/dL (1-2 hours)      Critically ill patients:  140 - 180 mg/dL   Reason:  Received consult for new diagnosis.   Inpatient Diabetes Program Recommendations HgbA1C: =12.4 Outpatient Referral: MD: patient needs order for outpatient education  Patient will also need a RX for a glucose meter, strips and lancets.    Note: Diabetes Coordinator spent time with patient on Monday answering questions and giving basic information.  Patient said he had already practiced giving his own insulin and was comfortable with it.  He said he liked using the insulin pen if he had to go home on insulin.  Patient has viewed some of the diabetes videos on the patient education channel.   Bedside RN will continue basic diabetes education throughout the remainder of the patients admission.  Diabetes Coordinator is available for questions or assistance as needed.   Thank you  Piedad Climes RN,BSN,CDE Inpatient Diabetes Coordinator

## 2011-05-13 NOTE — Progress Notes (Signed)
Discussed CHF, smoking cessation, low sodium diet and ex with pt. Not sure of pts dedication to change. Needs DM outpt classes.  [phone] Ethelda Chick CES, ACSM

## 2011-05-13 NOTE — Progress Notes (Signed)
Subjective:  No SOB  Objective:  Vital Signs in the last 24 hours: Temp:  [97.3 F (36.3 C)-98.6 F (37 C)] 97.3 F (36.3 C) (02/13 1006) Pulse Rate:  [68-82] 72  (02/13 1006) Resp:  [16] 16  (02/13 0040) BP: (134-172)/(72-101) 149/86 mmHg (02/13 1006) SpO2:  [95 %-98 %] 98 % (02/13 1006) Weight:  [116.6 kg (257 lb 0.9 oz)] 116.6 kg (257 lb 0.9 oz) (02/13 0040)  Intake/Output from previous day:  Intake/Output Summary (Last 24 hours) at 05/13/11 1235 Last data filed at 05/13/11 0834  Gross per 24 hour  Intake   1620 ml  Output    405 ml  Net   1215 ml    Physical Exam: General appearance: alert, cooperative and no distress Lungs: clear to auscultation bilaterally Heart: regular rate and rhythm   Rate: 74  Rhythm: normal sinus rhythm  Lab Results:  Basename 05/11/11 0555  WBC 8.7  HGB 12.9*  PLT 212    Basename 05/13/11 0655 05/12/11 0525  NA 140 139  K 3.9 4.2  CL 105 103  CO2 24 25  GLUCOSE 146* 180*  BUN 10 11  CREATININE 0.84 0.85   No results found for this basename: TROPONINI:2,CK,MB:2 in the last 72 hours Hepatic Function Panel No results found for this basename: PROT,ALBUMIN,AST,ALT,ALKPHOS,BILITOT,BILIDIR,IBILI in the last 72 hours No results found for this basename: CHOL in the last 72 hours No results found for this basename: INR in the last 72 hours  Imaging: Imaging results have been reviewed  Cardiac Studies: repeat 2d shows EF 45%  Assessment/Plan:   Active Problems:  Acute diastolic heart failure, 2D EF 81-19% with grade 2 diastloic dysfunction  NICM, minor CAD at cath, EF 25% at cath, 55% by 2D  TOBACCO DEPENDENCE  Diabetes mellitus  HTN (hypertension), malignant  Family history of early CAD, brother died 73 CAD  Dyslipidemia   Plan- home later today   Corine Shelter PA-C 05/13/2011, 12:35 PM    Agree with note written by Corine Shelter PAC  BP better. No CP/SOB. LVEF 45% by 2D. Meds adjusted. Exam benign. Has stopped smoking.  OK for D/C home today. ROV with [place] 2 weeks.    Runell Gess 05/13/2011 12:58 PM

## 2011-05-14 LAB — CBC
Hemoglobin: 13.1 g/dL (ref 13.0–17.0)
MCH: 28.4 pg (ref 26.0–34.0)
MCV: 85.1 fL (ref 78.0–100.0)
Platelets: 222 10*3/uL (ref 150–400)
RBC: 4.62 MIL/uL (ref 4.22–5.81)
WBC: 7.6 10*3/uL (ref 4.0–10.5)

## 2011-05-14 LAB — GLUCOSE, CAPILLARY
Glucose-Capillary: 147 mg/dL — ABNORMAL HIGH (ref 70–99)
Glucose-Capillary: 166 mg/dL — ABNORMAL HIGH (ref 70–99)

## 2011-05-14 LAB — BASIC METABOLIC PANEL
CO2: 24 mEq/L (ref 19–32)
Calcium: 9.4 mg/dL (ref 8.4–10.5)
Chloride: 104 mEq/L (ref 96–112)
Creatinine, Ser: 0.91 mg/dL (ref 0.50–1.35)
Glucose, Bld: 140 mg/dL — ABNORMAL HIGH (ref 70–99)

## 2011-05-14 MED ORDER — INSULIN GLARGINE 100 UNIT/ML ~~LOC~~ SOLN: 34.0000 [IU] | Freq: Every day | SUBCUTANEOUS | Status: DC

## 2011-05-14 MED ORDER — FUROSEMIDE 20 MG PO TABS: 20.0000 mg | ORAL_TABLET | Freq: Two times a day (BID) | ORAL | Status: DC

## 2011-05-14 MED ORDER — ASPIRIN 325 MG PO TABS: 325.0000 mg | ORAL_TABLET | Freq: Every day | ORAL | Status: DC

## 2011-05-14 MED ORDER — INSULIN ASPART 100 UNIT/ML ~~LOC~~ SOLN: SUBCUTANEOUS | Status: DC

## 2011-05-14 MED ORDER — ROSUVASTATIN CALCIUM 20 MG PO TABS: 20.0000 mg | ORAL_TABLET | Freq: Every day | ORAL | Status: DC

## 2011-05-14 MED ORDER — CARVEDILOL 6.25 MG PO TABS: 18.7500 mg | ORAL_TABLET | Freq: Two times a day (BID) | ORAL | Status: DC

## 2011-05-14 MED ORDER — LISINOPRIL 20 MG PO TABS: 20.0000 mg | ORAL_TABLET | Freq: Every day | ORAL | Status: DC

## 2011-05-14 MED ORDER — POTASSIUM CHLORIDE CRYS ER 20 MEQ PO TBCR: 20.0000 meq | EXTENDED_RELEASE_TABLET | Freq: Every day | ORAL | Status: DC

## 2011-05-14 NOTE — Progress Notes (Signed)
D/c instructions given to pt in detailed and he verbalized understanding.  Awaiting on ride at this time.

## 2011-05-14 NOTE — Progress Notes (Signed)
Patient re-educated this morning about the importance of fluids and watching output. Patient stated he would use the urinal. Will continue to monitor.

## 2011-05-14 NOTE — Progress Notes (Signed)
Patient had not been using urinal to measure urine output. Patient educated on importance of watching his output and needing to measure. Will continue to monitor.

## 2011-05-14 NOTE — Discharge Summary (Signed)
Physician Discharge Summary  Patient ID: Miguel Harvey MRN: [id number] DOB/AGE: 1945/10/18 66 y.o.  Admit date: 05/07/2011 Discharge date: 05/14/2011  Primary Care Physician:  User Centricity, MD, MD   Discharge Diagnoses:    Patient Active Problem List  Diagnoses  . TOBACCO DEPENDENCE  . Diabetes mellitus  . HTN (hypertension), malignant  . Acute diastolic heart failure, 2D EF 82-95% with grade 2 diastloic dysfunction  . Family history of early CAD, brother died 68 CAD  . Dyslipidemia  . NICM, minor CAD at cath, EF 25% at cath, 55% by 2D    Medication List  As of 05/14/2011  3:17 PM   TAKE these medications         aspirin 325 MG tablet   Take 1 tablet (325 mg total) by mouth daily.      carvedilol 6.25 MG tablet   Commonly known as: COREG   Take 3 tablets (18.75 mg total) by mouth 2 (two) times daily with a meal.      furosemide 20 MG tablet   Commonly known as: LASIX   Take 1 tablet (20 mg total) by mouth 2 (two) times daily.      insulin aspart 100 UNIT/ML injection   Commonly known as: novoLOG   Inject 0-15 units into the skin 3 times daily, with meals, as follows:  CBG 70-120, no Insulin. CBG 120-151, 2 units. CBG 151-200, 3 units.   CBG 201-250, 5 units. CBG 251-300, 8 units. CBG 301-350, 11 units.    CBG 351-400, 15 units.      insulin glargine 100 UNIT/ML injection   Commonly known as: LANTUS   Inject 34 Units into the skin at bedtime.      lisinopril 20 MG tablet   Commonly known as: PRINIVIL,ZESTRIL   Take 1 tablet (20 mg total) by mouth daily.      potassium chloride SA 20 MEQ tablet   Commonly known as: K-DUR,KLOR-CON   Take 1 tablet (20 mEq total) by mouth daily.      rosuvastatin 20 MG tablet   Commonly known as: CRESTOR   Take 1 tablet (20 mg total) by mouth daily at 6 PM.             Disposition and Follow-up:  Follow up with Primary MD and with Dr Herbie Baltimore, [facility].  Consults:  cardiology  Dr  Bryan Lemma, Cardiologist.   Significant Diagnostic Studies:  Dg Chest 2 View  05/08/2011  *RADIOLOGY REPORT*  Clinical Data: Pneumonia and shortness of breath.  CHEST - 2 VIEW  Comparison: Multiple priors, including chest x-ray 05/07/2011.  Findings: Compared to the recent prior examination, lung volumes have improved.  There continues to be mild diffuse interstitial prominence and patchy airspace opacities, however, these have significantly decreased compared to the prior examination and are predominately in the lower lungs at this point.  Trace bilateral pleural effusions.  Minimal residual vascular congestion.  Heart size is within normal limits.  Mediastinal contours are unremarkable.  IMPRESSION:  1.  Improving aeration in the lungs bilaterally likely reflect resolving pulmonary edema.  Small bilateral pleural effusions have also decreased.  Original Report Authenticated By: Florencia Reasons, M.D.   Ct Angio Chest W/cm &/or Wo Cm  05/07/2011  *RADIOLOGY REPORT*  Clinical Data: 66 year old male with shortness of breath. Tachycardia.  CT ANGIOGRAPHY CHEST  Technique:  Multidetector CT imaging of the chest using the standard protocol during bolus administration of intravenous contrast. Multiplanar reconstructed  images including MIPs were obtained and reviewed to evaluate the vascular anatomy.  Contrast: OMNIPAQUE IOHEXOL 350 MG/ML IV SOLN  Comparison: Chest radiographs 05/07/2011.  Findings: Adequate contrast bolus timing in the pulmonary arterial tree.  Respiratory motion artifact in the lower lungs. No focal filling defect identified in the pulmonary arterial tree to suggest the presence of acute pulmonary embolism.  Atelectatic changes to the major airways.  Layering bilateral pleural effusions, small and greater on the right.  No pericardial effusion.  Pulmonary atelectasis.  Ground-glass opacity in the superior segment of the left lower lobe and the lateral aspect of the superior right lower  lobes segment.  Nodular bronchovascular opacity in the lingula measuring 9 mm (image 49).  Negative visualized upper abdominal viscera.  Increased number of mediastinal nodes.  Abnormally enlarged right paratracheal node of the 15 mm in short axis.  Small hilar nodes may be increased in number.  Negative thoracic inlet.  No axillary lymphadenopathy.  No acute osseous abnormality identified.  IMPRESSION: 1. No evidence of acute pulmonary embolus. 2.  Small bilateral pleural effusions with atelectasis and superimposed bilateral superior segment lower lobe patchy/ground- glass opacities which presumably represent infection. 3.  Nodular more solid indeterminate opacity in the lingula measuring 9 mm (series 5 image 49).  Recommend post-treatment follow-up chest CT once the acute pulmonary process has clinically resolved to document resolution. 4.  Mediastinal lymphadenopathy favor to be reactive.  Attention directed on follow-up.  Original Report Authenticated By: Harley Hallmark, M.D.   Dg Chest Portable 1 View  05/07/2011  *RADIOLOGY REPORT*  Clinical Data: Shortness of breath, weakness, tachycardia  PORTABLE CHEST - 1 VIEW  Comparison: None.  Findings: There is airspace disease primarily at the lung bases although there is some  indistinctness of the perihilar vasculature as well.  These findings may represent pneumonia, but pulmonary vascular congestion and mild edema cannot be excluded.  Follow-up two-view chest x-ray is recommended.  No definite effusion is seen. The heart is mildly enlarged.  IMPRESSION: Airspace disease and some perihilar vascular prominence.  Consider pneumonia or possibly pulmonary vascular congestion and edema. Recommend two-view chest x-ray.  Original Report Authenticated By: Juline Patch, M.D.   Brief H and P: For complete details, refer to admission H and P. However, in brief, this is a 66 year old African American male with history of tobacco abuse, presenting to [facility] emergency  room with acute shortness of breath with sudden onset, unassociated with chest pain. Patient had not seen a physician in the last 6 years and was not on medications either. BP was 207/126, chest x-ray showed pulmonary edema, BNP was 744 and blood glucose 412 with anion gap of 19. He was admitted for further evaluation, investigation and management.  Physical Exam: On 05/14/11. General: Comfortable, alert, communicative, fully oriented, not short of breath at rest.  HEENT: No clinical pallor, no jaundice, no conjunctival injection or discharge. Hydration status is fair.  NECK: Supple, JVP not seen, no carotid bruits, no palpable lymphadenopathy, no palpable goiter.  CHEST: Clinically clear to auscultation, no wheezes, no crackles.  HEART: Sounds 1 and 2 heard, normal, regular, no murmurs.  ABDOMEN: Moderately obese, full, soft, non-tender, no palpable organomegaly, no palpable masses, normal bowel sounds.  GENITALIA: Not examined.  LOWER EXTREMITIES: No pitting edema, palpable peripheral pulses.  MUSCULOSKELETAL SYSTEM: Generalized osteoarthritic changes, otherwise, normal.  CENTRAL NERVOUS SYSTEM: No focal neurologic deficit on gross examination.  Hospital Course:  Active Problems:  1. Diabetes mellitus: This  is a new diagnosis. As described above, patient presented with a blood glucose of 412 and anion gap of 19, consistent with DKA. He was managed initially with ivi insulin, per gluco-stabilizer protocol, then with improvement, transitioned to SSI, diet, and scheduled Lantus, with satisfactory control. He has received diabetes teaching and instructed in insulin self-administration. Ace inhibitor was instituted, for proteinuria. Of note, HBA1c was markedly elevated at 12.4. Patient will attend outpatient diabetic classes, on discharge. 2. HTN (hypertension), malignant: Patient was found on initial evaluation, to have a markedly elevated BP of 207/126. Improved control was achieved with ACE-i, Coreg  and Diuretics.  3. Acute diastolic heart failure/NICM: Patient was managed with a combination of Lasix, ACE-inhibitor and beta-blocker therapy. 2D Echcardiogram of 05/08/11, showed EF 50-55% with grade 2 diastloic dysfunction. Cardiac catheterization, performed by Dr Zoila Shutter, on  05/12/11, showed minor CAD, EF 25% at cath on 05/12/11. Repeat 2D Echo, to re-assess LVEF and eval for possibility of acute hypertensive cardiomyopathy or possibly Takatsubo cardiomyopathy, confirmed EF of 45%.  4. Dyslipidemia: LDL is 160. Patient is now on Crestor. 5. Smoker: Patient was counseled appropriately.  6. Transaminitis: Patient was noted to have a mild transaminitis in the initial few days of hospitalization, but this was transient, and deemed secondary to congestive hepatopathy from CHF.   Comment: Patient is considered stable for discharge on 05/14/11. Nodular solid indeterminate opacity in the lingula (9 mm):  Will need f/u CT chest to assure has resolved/determine need for further investigation.  This can be arranged by his PMD.  Time spent on Discharge: 45 mins.  Signed: Neely Cecena,CHRISTOPHER 05/14/2011, 3:17 PM

## 2011-06-24 ENCOUNTER — Encounter: Payer: Self-pay | Admitting: Family Medicine

## 2011-06-24 ENCOUNTER — Ambulatory Visit (INDEPENDENT_AMBULATORY_CARE_PROVIDER_SITE_OTHER): Payer: Medicare Other | Admitting: Family Medicine

## 2011-06-24 VITALS — BP 168/93 | HR 85 | Ht 71.0 in | Wt 244.0 lb

## 2011-06-24 DIAGNOSIS — I428 Other cardiomyopathies: Secondary | ICD-10-CM

## 2011-06-24 DIAGNOSIS — E785 Hyperlipidemia, unspecified: Secondary | ICD-10-CM

## 2011-06-24 DIAGNOSIS — F17200 Nicotine dependence, unspecified, uncomplicated: Secondary | ICD-10-CM

## 2011-06-24 DIAGNOSIS — I10 Essential (primary) hypertension: Secondary | ICD-10-CM

## 2011-06-24 DIAGNOSIS — R911 Solitary pulmonary nodule: Secondary | ICD-10-CM

## 2011-06-24 DIAGNOSIS — E119 Type 2 diabetes mellitus without complications: Secondary | ICD-10-CM

## 2011-06-24 LAB — COMPREHENSIVE METABOLIC PANEL
ALT: 41 U/L (ref 0–53)
AST: 27 U/L (ref 0–37)
Alkaline Phosphatase: 79 U/L (ref 39–117)
Sodium: 139 mEq/L (ref 135–145)
Total Bilirubin: 0.3 mg/dL (ref 0.3–1.2)
Total Protein: 6.3 g/dL (ref 6.0–8.3)

## 2011-06-24 MED ORDER — LISINOPRIL 20 MG PO TABS: 20.0000 mg | ORAL_TABLET | Freq: Every day | ORAL | Status: DC

## 2011-06-24 MED ORDER — FUROSEMIDE 20 MG PO TABS: 20.0000 mg | ORAL_TABLET | Freq: Two times a day (BID) | ORAL | Status: DC

## 2011-06-24 MED ORDER — GLUCOSE BLOOD VI STRP: ORAL_STRIP | Status: DC

## 2011-06-24 MED ORDER — CARVEDILOL 6.25 MG PO TABS: 18.7500 mg | ORAL_TABLET | Freq: Two times a day (BID) | ORAL | Status: DC

## 2011-06-24 MED ORDER — ROSUVASTATIN CALCIUM 20 MG PO TABS: 20.0000 mg | ORAL_TABLET | Freq: Every day | ORAL | Status: DC

## 2011-06-24 MED ORDER — ASPIRIN 325 MG PO TABS: 325.0000 mg | ORAL_TABLET | Freq: Every day | ORAL | Status: DC

## 2011-06-24 MED ORDER — POTASSIUM CHLORIDE CRYS ER 20 MEQ PO TBCR: 20.0000 meq | EXTENDED_RELEASE_TABLET | Freq: Every day | ORAL | Status: DC

## 2011-06-24 NOTE — Patient Instructions (Signed)
Smoking Cessation This document explains the best ways for you to quit smoking and new treatments to help. It lists new medicines that can double or triple your chances of quitting and quitting for good. It also considers ways to avoid relapses and concerns you may have about quitting, including weight gain. NICOTINE: A POWERFUL ADDICTION If you have tried to quit smoking, you know how hard it can be. It is hard because nicotine is a very addictive drug. For some people, it can be as addictive as heroin or cocaine. Usually, people make 2 or 3 tries, or more, before finally being able to quit. Each time you try to quit, you can learn about what helps and what hurts. Quitting takes hard work and a lot of effort, but you can quit smoking. QUITTING SMOKING IS ONE OF THE MOST IMPORTANT THINGS YOU WILL EVER DO.  You will live longer, feel better, and live better.   The impact on your body of quitting smoking is felt almost immediately:   Within 20 minutes, blood pressure decreases. Pulse returns to its normal level.   After 8 hours, carbon monoxide levels in the blood return to normal. Oxygen level increases.   After 24 hours, chance of heart attack starts to decrease. Breath, hair, and body stop smelling like smoke.   After 48 hours, damaged nerve endings begin to recover. Sense of taste and smell improve.   After 72 hours, the body is virtually free of nicotine. Bronchial tubes relax and breathing becomes easier.   After 2 to 12 weeks, lungs can hold more air. Exercise becomes easier and circulation improves.   Quitting will reduce your risk of having a heart attack, stroke, cancer, or lung disease:   After 1 year, the risk of coronary heart disease is cut in half.   After 5 years, the risk of stroke falls to the same as a nonsmoker.   After 10 years, the risk of lung cancer is cut in half and the risk of other cancers decreases significantly.   After 15 years, the risk of coronary heart  disease drops, usually to the level of a nonsmoker.   If you are pregnant, quitting smoking will improve your chances of having a healthy baby.   The people you live with, especially your children, will be healthier.   You will have extra money to spend on things other than cigarettes.  FIVE KEYS TO QUITTING Studies have shown that these 5 steps will help you quit smoking and quit for good. You have the best chances of quitting if you use them together: 1. Get ready.  2. Get support and encouragement.  3. Learn new skills and behaviors.  4. Get medicine to reduce your nicotine addiction and use it correctly.  5. Be prepared for relapse or difficult situations. Be determined to continue trying to quit, even if you do not succeed at first.  1. GET READY  Set a quit date.   Change your environment.   Get rid of ALL cigarettes, ashtrays, matches, and lighters in your home, car, and place of work.   Do not let people smoke in your home.   Review your past attempts to quit. Think about what worked and what did not.   Once you quit, do not smoke. NOT EVEN A PUFF!  2. GET SUPPORT AND ENCOURAGEMENT Studies have shown that you have a better chance of being successful if you have help. You can get support in many ways.  Tell   your family, friends, and coworkers that you are going to quit and need their support. Ask them not to smoke around you.   Talk to your caregivers (doctor, dentist, nurse, pharmacist, psychologist, and/or smoking counselor).   Get individual, group, or telephone counseling and support. The more counseling you have, the better your chances are of quitting. Programs are available at local hospitals and health centers. Call your local health department for information about programs in your area.   Spiritual beliefs and practices may help some smokers quit.   Quit meters are small computer programs online or downloadable that keep track of quit statistics, such as amount  of "quit-time," cigarettes not smoked, and money saved.   Many smokers find one or more of the many self-help books available useful in helping them quit and stay off tobacco.  3. LEARN NEW SKILLS AND BEHAVIORS  Try to distract yourself from urges to smoke. Talk to someone, go for a walk, or occupy your time with a task.   When you first try to quit, change your routine. Take a different route to work. Drink tea instead of coffee. Eat breakfast in a different place.   Do something to reduce your stress. Take a hot bath, exercise, or read a book.   Plan something enjoyable to do every day. Reward yourself for not smoking.   Explore interactive web-based programs that specialize in helping you quit.  4. GET MEDICINE AND USE IT CORRECTLY Medicines can help you stop smoking and decrease the urge to smoke. Combining medicine with the above behavioral methods and support can quadruple your chances of successfully quitting smoking. The [place] Food and Drug Administration (FDA) has approved 7 medicines to help you quit smoking. These medicines fall into 3 categories.  Nicotine replacement therapy (delivers nicotine to your body without the negative effects and risks of smoking):   Nicotine gum: Available over-the-counter.   Nicotine lozenges: Available over-the-counter.   Nicotine inhaler: Available by prescription.   Nicotine nasal spray: Available by prescription.   Nicotine skin patches (transdermal): Available by prescription and over-the-counter.   Antidepressant medicine (helps people abstain from smoking, but how this works is unknown):   Bupropion sustained-release (SR) tablets: Available by prescription.   Nicotinic receptor partial agonist (simulates the effect of nicotine in your brain):   Varenicline tartrate tablets: Available by prescription.   Ask your caregiver for advice about which medicines to use and how to use them. Carefully read the information on the package.    Everyone who is trying to quit may benefit from using a medicine. If you are pregnant or trying to become pregnant, nursing an infant, you are under age 18, or you smoke fewer than 10 cigarettes per day, talk to your caregiver before taking any nicotine replacement medicines.   You should stop using a nicotine replacement product and call your caregiver if you experience nausea, dizziness, weakness, vomiting, fast or irregular heartbeat, mouth problems with the lozenge or gum, or redness or swelling of the skin around the patch that does not go away.   Do not use any other product containing nicotine while using a nicotine replacement product.   Talk to your caregiver before using these products if you have diabetes, heart disease, asthma, stomach ulcers, you had a recent heart attack, you have high blood pressure that is not controlled with medicine, a history of irregular heartbeat, or you have been prescribed medicine to help you quit smoking.  5. BE PREPARED FOR RELAPSE OR   DIFFICULT SITUATIONS  Most relapses occur within the first 3 months after quitting. Do not be discouraged if you start smoking again. Remember, most people try several times before they finally quit.   You may have symptoms of withdrawal because your body is used to nicotine. You may crave cigarettes, be irritable, feel very hungry, cough often, get headaches, or have difficulty concentrating.   The withdrawal symptoms are only temporary. They are strongest when you first quit, but they will go away within 10 to 14 days.  Here are some difficult situations to watch for:  Alcohol. Avoid drinking alcohol. Drinking lowers your chances of successfully quitting.   Caffeine. Try to reduce the amount of caffeine you consume. It also lowers your chances of successfully quitting.   Other smokers. Being around smoking can make you want to smoke. Avoid smokers.   Weight gain. Many smokers will gain weight when they quit, usually  less than 10 pounds. Eat a healthy diet and stay active. Do not let weight gain distract you from your main goal, quitting smoking. Some medicines that help you quit smoking may also help delay weight gain. You can always lose the weight gained after you quit.   Bad mood or depression. There are a lot of ways to improve your mood other than smoking.  If you are having problems with any of these situations, talk to your caregiver. SPECIAL SITUATIONS AND CONDITIONS Studies suggest that everyone can quit smoking. Your situation or condition can give you a special reason to quit.  Pregnant women/new mothers: By quitting, you protect your baby's health and your own.   Hospitalized patients: By quitting, you reduce health problems and help healing.   Heart attack patients: By quitting, you reduce your risk of a second heart attack.   Lung, head, and neck cancer patients: By quitting, you reduce your chance of a second cancer.   Parents of children and adolescents: By quitting, you protect your children from illnesses caused by secondhand smoke.  QUESTIONS TO THINK ABOUT Think about the following questions before you try to stop smoking. You may want to talk about your answers with your caregiver.  Why do you want to quit?   If you tried to quit in the past, what helped and what did not?   What will be the most difficult situations for you after you quit? How will you plan to handle them?   Who can help you through the tough times? Your family? Friends? Caregiver?   What pleasures do you get from smoking? What ways can you still get pleasure if you quit?  Here are some questions to ask your caregiver:  How can you help me to be successful at quitting?   What medicine do you think would be best for me and how should I take it?   What should I do if I need more help?   What is smoking withdrawal like? How can I get information on withdrawal?  Quitting takes hard work and a lot of effort,  but you can quit smoking. FOR MORE INFORMATION  Smokefree.gov (http://www.smokefree.gov) provides free, accurate, evidence-based information and professional assistance to help support the immediate and long-term needs of people trying to quit smoking. Document Released: 03/10/2001 Document Revised: 03/05/2011 Document Reviewed: 12/31/2008 [employer] Patient Information 2012 [employer]. 

## 2011-06-24 NOTE — Progress Notes (Signed)
Patient name: Miguel Harvey Medical record number: [id number] Date of birth: [birth year] Age: 66 y.o. Gender: male  Subjective:   Patient presents today for new patient visit. 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 Prior to this visit patient had not seen a doctor for over 12 years. Pt was formally evaluated by [facility] for cardiac issues.   Patient was placed on a heart failure medical regimen of Coreg, aspirin, lisinopril, Lasix, Crestor. Patient was also placed on Lantus 35 units and NovoLog sliding scale 56 units 3 times a day a.c. for diabetes. Tobacco cessation supplies including nicotine patch nicotine gum were also applied. Pt has missed some of his medication today.   Patient also had a 9 mm nodule on chest CT.   Today, patient states he does otherwise clinically asymptomatic. Patient states that he did initially present chest pain shortness of breath however this is clinically resolved. Per patient he has been able to ambulate with minimal shortness of breath or increased work of breathing. Patient is still smoking up to one pack per day. Patient denies any orthopnea or PND. Patient is compliant with his hypertensive regimen regimen up until last week Friday as all his medications ran out. Patient denies any chest pains, swelling, cramping or heart palpitations. Patient has not been checking his blood pressures on a daily basis.  From a diabetic standpoint patient has been compliant with his Lantus and sliding scale. Patient has been checking his blood sugars on a 3 times a day regimen and has been keeping a log. Per records patient's blood sugars have been in the 180s to 100s mainly.      Patient Active Problem List   Diagnoses  . TOBACCO DEPENDENCE  . Diabetes mellitus  . HTN (hypertension), malignant  . Acute diastolic heart failure, 2D EF 40-98% with grade 2 diastloic dysfunction  . Family history of early CAD, brother died 34 CAD  . Dyslipidemia  . NICM, minor CAD at cath, EF 25% at cath, 55% by 2D   Past Medical History: Past Medical History  Diagnosis Date  . Hypertension   . Diabetes mellitus   . CHF (congestive heart failure)     Past Surgical History: Past Surgical History  Procedure Date  . Cyst removal from  right leg about 6 years ago     Social History: History   Social History  . Marital Status: Single    Spouse Name: N/A    Number of Children: N/A  . Years of Education: N/A   Social History Main Topics  . Smoking status: Current Everyday Smoker -- 1.0 packs/day for 40 years    Types: Cigarettes  . Smokeless tobacco: Former Neurosurgeon    Quit date: 05/07/2011  . Alcohol Use: None  . Drug Use: No  . Sexually Active: None   Other Topics Concern  . None   Social History Narrative  . None    Family History: No family history on file.  Allergies: No Known Allergies  Current Outpatient Prescriptions  Medication Sig Dispense Refill  . insulin aspart (NOVOLOG) 100 UNIT/ML injection Inject 0-15 units into the skin 3 times daily, with meals, as follows: CBG 70-120, no Insulin. CBG 120-151, 2 units. CBG 151-200, 3 units.  CBG 201-250, 5 units. CBG 251-300, 8 units. CBG  301-350, 11 units.   CBG 351-400, 15 units.  1 vial  0  . insulin glargine (LANTUS) 100 UNIT/ML injection Inject 34 Units into the skin at bedtime.  10 mL  0  . ACCU-CHEK FASTCLIX LANCETS MISC       . aspirin 325 MG tablet Take 1 tablet (325 mg total) by mouth daily.  100 tablet  3  . Blood Glucose Monitoring Suppl (ACCU-CHEK AVIVA PLUS) W/DEVICE KIT       . carvedilol (COREG) 6.25 MG tablet Take 3 tablets (18.75 mg total) by mouth 2 (two) times daily with a meal.  180 tablet  0  . furosemide (LASIX)  20 MG tablet Take 1 tablet (20 mg total) by mouth 2 (two) times daily.  60 tablet  6  . glucose blood (ACCU-CHEK AVIVA PLUS) test strip Check blood sugars TID  100 each  6  . lisinopril (PRINIVIL,ZESTRIL) 20 MG tablet Take 1 tablet (20 mg total) by mouth daily.  30 tablet  0  . potassium chloride SA (K-DUR,KLOR-CON) 20 MEQ tablet Take 1 tablet (20 mEq total) by mouth daily.  30 tablet  6  . rosuvastatin (CRESTOR) 20 MG tablet Take 1 tablet (20 mg total) by mouth daily at 6 PM.  30 tablet  6   Review Of Systems: Per HPI, otherwise 12 point review of systems was performed and was unremarkable.  Physical Exam: Filed Vitals:   06/24/11 1017  BP: 168/93  Pulse: 85    General: alert, cooperative and moderately obese HEENT: PERRLA and extra ocular movement intact Heart: S1, S2 normal, no murmur, rub or gallop, regular rate and rhythm Lungs: clear to auscultation, no wheezes or rales and unlabored breathing Abdomen: abdomen is soft without significant tenderness, masses, organomegaly or guarding Extremities: extremities normal, atraumatic, no cyanosis or edema Skin:no rashes Neurology: normal without focal findings, mental status, speech normal, alert and oriented x3, PERLA and reflexes normal and symmetric  Labs and Imaging: Lab Results  Component Value Date/Time   NA 139 05/14/2011  6:25 AM   K 3.7 05/14/2011  6:25 AM   CL 104 05/14/2011  6:25 AM   CO2 24 05/14/2011  6:25 AM   BUN 10 05/14/2011  6:25 AM   CREATININE 0.91 05/14/2011  6:25 AM   GLUCOSE 140* 05/14/2011  6:25 AM   Lab Results  Component Value Date   WBC 7.6 05/14/2011   HGB 13.1 05/14/2011   HCT 39.3 05/14/2011   MCV 85.1 05/14/2011   PLT 222 05/14/2011   Lab Results  Component Value Date   HGBA1C 12.4* 05/09/2011   Lab Results  Component Value Date   LDLCALC 160* 05/07/2011

## 2011-06-26 ENCOUNTER — Encounter: Payer: Self-pay | Admitting: Family Medicine

## 2011-06-26 ENCOUNTER — Ambulatory Visit
Admission: RE | Admit: 2011-06-26 | Discharge: 2011-06-26 | Disposition: A | Discharge status: Home / Self Care | Payer: Medicare Other | Source: Ambulatory Visit | Attending: Family Medicine | Admitting: Family Medicine

## 2011-06-26 DIAGNOSIS — R911 Solitary pulmonary nodule: Secondary | ICD-10-CM

## 2011-06-29 ENCOUNTER — Encounter: Payer: Self-pay | Admitting: Family Medicine

## 2011-07-01 DIAGNOSIS — R911 Solitary pulmonary nodule: Secondary | ICD-10-CM | POA: Insufficient documentation

## 2011-07-01 NOTE — Assessment & Plan Note (Signed)
Currently stable on current regimen. Will continue. Due for A1C 07/2011.

## 2011-07-01 NOTE — Assessment & Plan Note (Signed)
Discussed cessation. Pt is considering. Handout given.

## 2011-07-01 NOTE — Assessment & Plan Note (Addendum)
Elevated today. Will increase coreg to help with this. Discussed hypotensive red flags.  Discussed compliance. Will check Cr and K.

## 2011-07-01 NOTE — Assessment & Plan Note (Signed)
Will send for repeat CT scan.

## 2011-07-01 NOTE — Assessment & Plan Note (Signed)
Clinically stable on current HF regimen. Has follow up with [facility] in next 3-6 months.

## 2011-07-15 ENCOUNTER — Ambulatory Visit (INDEPENDENT_AMBULATORY_CARE_PROVIDER_SITE_OTHER): Payer: Medicare Other | Admitting: Family Medicine

## 2011-07-15 ENCOUNTER — Encounter: Payer: Self-pay | Admitting: Family Medicine

## 2011-07-15 VITALS — BP 133/78 | HR 80 | Ht 71.0 in | Wt 234.0 lb

## 2011-07-15 DIAGNOSIS — I10 Essential (primary) hypertension: Secondary | ICD-10-CM

## 2011-07-15 DIAGNOSIS — R911 Solitary pulmonary nodule: Secondary | ICD-10-CM

## 2011-07-15 DIAGNOSIS — I428 Other cardiomyopathies: Secondary | ICD-10-CM

## 2011-07-15 DIAGNOSIS — E119 Type 2 diabetes mellitus without complications: Secondary | ICD-10-CM

## 2011-07-15 MED ORDER — CARVEDILOL 25 MG PO TABS: 25.0000 mg | ORAL_TABLET | Freq: Two times a day (BID) | ORAL | Status: DC

## 2011-07-15 MED ORDER — INSULIN GLARGINE 100 UNIT/ML ~~LOC~~ SOLN: 34.0000 [IU] | Freq: Every day | SUBCUTANEOUS | Status: DC

## 2011-07-15 MED ORDER — GLUCOSE BLOOD VI STRP: ORAL_STRIP | Status: DC

## 2011-07-15 MED ORDER — METFORMIN HCL 500 MG PO TABS: 500.0000 mg | ORAL_TABLET | Freq: Two times a day (BID) | ORAL | Status: DC

## 2011-07-15 MED ORDER — ACCU-CHEK FASTCLIX LANCETS MISC: 1.0000 "application " | Freq: Three times a day (TID) | Status: DC

## 2011-07-15 MED ORDER — "SYRINGE/NEEDLE (DISP) 29G X 1/2"" 1 ML MISC": Status: DC

## 2011-07-15 MED ORDER — INSULIN SYRINGES (DISPOSABLE) U-100 0.5 ML MISC: Status: DC

## 2011-07-15 NOTE — Progress Notes (Signed)
S:  Pt presents today for general follow.  Pt with a baseline hx/o HTN, DM, NICM w/ EF 55% by 2D ECHO here for general follow up visit.  Pt has cardiology follow up with [facility]  DM: Checking CBGs?:yes How Often?:tid Blood Sugar Range:150s-190s Polyuria, polydypsia, polyphagia:no Symptomatic Hypoglycemia:no Medication Compliance?:yes ACE if hypertensive/obesity?:yes Statin if LDL >100?:yes Lab Results  Component Value Date   HGBA1C 12.4* 05/09/2011   Lab Results  Component Value Date   LDLCALC 160* 05/07/2011   HTN:  Checking BPS Daily ?:yes BP Range:140s-150s Any HA, CP, SOB?:no Any Medication Side Effects:no Medication Compliance:yes Exercise?:yes; would like to mow lawn Weight Loss?:no  From a subjective standpoint, pt feels the best he has in a very long time.    Review of Systems - Negative except as noted above in HPI         O:  Current Outpatient Prescriptions  Medication Sig Dispense Refill  . ACCU-CHEK FASTCLIX LANCETS MISC Inject 1 application into the skin 3 (three) times daily.  102 each  6  . aspirin 325 MG tablet Take 1 tablet (325 mg total) by mouth daily.  100 tablet  3  . Blood Glucose Monitoring Suppl (ACCU-CHEK AVIVA PLUS) W/DEVICE KIT       . carvedilol (COREG) 25 MG tablet Take 1 tablet (25 mg total) by mouth 2 (two) times daily with a meal.  60 tablet  0  . furosemide (LASIX) 20 MG tablet Take 1 tablet (20 mg total) by mouth 2 (two) times daily.  60 tablet  6  . glucose blood (ACCU-CHEK AVIVA PLUS) test strip Check blood sugars TID  100 each  6  . insulin aspart (NOVOLOG) 100 UNIT/ML injection Inject 0-15 units into the skin 3 times daily, with meals, as follows: CBG 70-120, no Insulin. CBG 120-151, 2 units. CBG 151-200, 3 units.  CBG 201-250, 5 units. CBG 251-300, 8 units. CBG 301-350, 11 units.   CBG 351-400, 15 units.  1 vial  0  . insulin glargine (LANTUS) 100 UNIT/ML injection Inject 34 Units into the skin at bedtime.  10 mL  0  . Insulin  Syringes, Disposable, U-100 0.5 ML MISC Use 3 times daily with novolog  100 each  6  . lisinopril (PRINIVIL,ZESTRIL) 20 MG tablet Take 1 tablet (20 mg total) by mouth daily.  30 tablet  0  . metFORMIN (GLUCOPHAGE) 500 MG tablet Take 1 tablet (500 mg total) by mouth 2 (two) times daily with a meal.  180 tablet  3  . potassium chloride SA (K-DUR,KLOR-CON) 20 MEQ tablet Take 1 tablet (20 mEq total) by mouth daily.  30 tablet  6  . rosuvastatin (CRESTOR) 20 MG tablet Take 1 tablet (20 mg total) by mouth daily at 6 PM.  30 tablet  6  . Syringe/Needle, Disp, (PRODIGY SAFETY SYRINGES) 29G X 1/2" 1 ML MISC Use 3 times daily with novolog  100 each  6  . DISCONTD: carvedilol (COREG) 6.25 MG tablet Take 3 tablets (18.75 mg total) by mouth 2 (two) times daily with a meal.  180 tablet  0  . DISCONTD: insulin glargine (LANTUS) 100 UNIT/ML injection Inject 34 Units into the skin at bedtime.  10 mL  0    Wt Readings from Last 3 Encounters:  07/15/11 234 lb (106.142 kg)  06/24/11 244 lb (110.678 kg)  05/14/11 253 lb 15.5 oz (115.2 kg)   Temp Readings from Last 3 Encounters:  05/14/11 98 F (36.7 C) Oral  05/14/11  98 F (36.7 C) Oral   BP Readings from Last 3 Encounters:  07/15/11 133/78  06/24/11 168/93  05/14/11 126/76   Pulse Readings from Last 3 Encounters:  07/15/11 80  06/24/11 85  05/14/11 69     Chemistry      Component Value Date/Time   NA 139 06/24/2011 1109   K 4.2 06/24/2011 1109   CL 110 06/24/2011 1109   CO2 23 06/24/2011 1109   BUN 11 06/24/2011 1109   CREATININE 0.94 06/24/2011 1109   CREATININE 0.91 05/14/2011 0625      Component Value Date/Time   CALCIUM 8.8 06/24/2011 1109   ALKPHOS 79 06/24/2011 1109   AST 27 06/24/2011 1109   ALT 41 06/24/2011 1109   BILITOT 0.3 06/24/2011 1109       General: alert and cooperative HEENT: PERRLA and extra ocular movement intact Heart: S1, S2 normal, no murmur, rub or gallop, regular rate and rhythm Lungs: clear to auscultation, no wheezes  or rales and unlabored breathing Abdomen: abdomen is soft without significant tenderness, masses, organomegaly or guarding Extremities: extremities normal, atraumatic, no cyanosis or edema Skin:no rashes Neurology: normal without focal findings, mental status, speech normal, alert and oriented x3, PERLA and reflexes normal and symmetric   A/P:

## 2011-07-15 NOTE — Patient Instructions (Signed)
It was good to see today I have increased her correct 25 mg twice a day I have also started you on metformin Come back to see me in 4-6 weeks If you develop any chest pain, shortness of breath, worsening swelling please call to go to the emergency room Call if any questions God Bless, Doree Albee MD

## 2011-07-15 NOTE — Assessment & Plan Note (Deleted)
Repeat CT scan due  11/2011.

## 2011-07-15 NOTE — Assessment & Plan Note (Signed)
Will start on low dose metformin in addition to insulin regimen. Follow up in 4-6 weeks.

## 2011-07-15 NOTE — Assessment & Plan Note (Signed)
Coreg increased today to help with at home BPs. Will recheck labs in 4-6 weeks.

## 2011-07-15 NOTE — Assessment & Plan Note (Addendum)
Currently clinically asymptomatic. Likely at or near dry weight.

## 2011-07-22 ENCOUNTER — Other Ambulatory Visit: Payer: Self-pay | Admitting: *Deleted

## 2011-07-22 ENCOUNTER — Other Ambulatory Visit: Payer: Self-pay | Admitting: Family Medicine

## 2011-07-22 MED ORDER — INSULIN GLARGINE 100 UNIT/ML ~~LOC~~ SOLN: SUBCUTANEOUS | Status: DC

## 2011-07-22 MED ORDER — INSULIN GLARGINE 100 UNIT/ML ~~LOC~~ SOLN: 34.0000 [IU] | Freq: Every day | SUBCUTANEOUS | Status: DC

## 2011-07-22 NOTE — Telephone Encounter (Signed)
Addended by: Orvil Feil L on: 07/22/2011 04:18 PM   Modules accepted: Orders

## 2011-08-25 ENCOUNTER — Ambulatory Visit (INDEPENDENT_AMBULATORY_CARE_PROVIDER_SITE_OTHER): Payer: Medicare Other | Admitting: Family Medicine

## 2011-08-25 ENCOUNTER — Encounter: Payer: Self-pay | Admitting: Family Medicine

## 2011-08-25 VITALS — BP 149/81 | HR 92 | Ht 71.0 in | Wt 232.0 lb

## 2011-08-25 DIAGNOSIS — E119 Type 2 diabetes mellitus without complications: Secondary | ICD-10-CM

## 2011-08-25 DIAGNOSIS — I10 Essential (primary) hypertension: Secondary | ICD-10-CM

## 2011-08-25 LAB — COMPREHENSIVE METABOLIC PANEL
Albumin: 4.6 g/dL (ref 3.5–5.2)
Alkaline Phosphatase: 65 U/L (ref 39–117)
CO2: 17 mEq/L — ABNORMAL LOW (ref 19–32)
Glucose, Bld: 143 mg/dL — ABNORMAL HIGH (ref 70–99)
Potassium: 5.2 mEq/L (ref 3.5–5.3)
Sodium: 137 mEq/L (ref 135–145)
Total Protein: 7 g/dL (ref 6.0–8.3)

## 2011-08-25 MED ORDER — METFORMIN HCL 1000 MG PO TABS: 1000.0000 mg | ORAL_TABLET | Freq: Two times a day (BID) | ORAL | Status: DC

## 2011-08-25 MED ORDER — LISINOPRIL 30 MG PO TABS: 30.0000 mg | ORAL_TABLET | Freq: Every day | ORAL | Status: DC

## 2011-08-25 NOTE — Patient Instructions (Signed)
It was good to see you today  Your blood sugars and your blood pressures are doing much better  INCREASE YOUR METFORMIN TO 1000MG  TWICE DAILY INCREASE YOUR LISINOPRIL TO 30MG  DAILY  I am checking some blood work We will schedule your repeat CT scan for August 2013.  Come back for a follow up visit at that time.  Call if any questions,  God Bless, Doree Albee, MD

## 2011-08-26 ENCOUNTER — Telehealth: Payer: Self-pay | Admitting: Family Medicine

## 2011-08-26 DIAGNOSIS — I10 Essential (primary) hypertension: Secondary | ICD-10-CM

## 2011-08-26 LAB — MICROALBUMIN / CREATININE URINE RATIO: Creatinine, Urine: 73.8 mg/dL

## 2011-08-26 NOTE — Telephone Encounter (Signed)
Called to follow up with pt about lab results.  BMET    Component Value Date/Time   NA 137 08/25/2011 1155   K 5.2 08/25/2011 1155   CL 110 08/25/2011 1155   CO2 17* 08/25/2011 1155   GLUCOSE 143* 08/25/2011 1155   BUN 55* 08/25/2011 1155   CREATININE 1.79* 08/25/2011 1155   CREATININE 0.91 05/14/2011 0625   CALCIUM 9.6 08/25/2011 1155   GFRNONAA 86* 05/14/2011 0625   GFRAA >90 05/14/2011 0625   Had to leave VM. Instructed pt to STOP metformin and lisinopril. Also instructed to cut lasix in half. Instructed that we will need repeat labs in 48 hours. Told to call if he had any particular questions or concerns.

## 2011-08-26 NOTE — Assessment & Plan Note (Signed)
Improved A1C. Will increase metformin to 100mg  BID.

## 2011-08-26 NOTE — Progress Notes (Signed)
Subjective:  HTN:  Checking BPs Daily ?:intermittently  BP Range:130s-150s Any HA, CP, SOB?:no Any Medication Side Effects:no Medication Compliance:yes Salt intake?:working on cutting down  Exercise?:walking intermittently   DM: Checking CBGs?:yes How Often?:tid Blood Sugar Range:120s-150s Polyuria, polydypsia, polyphagia:no Symptomatic Hypoglycemia:no Medication Compliance?:yes ACE if hypertensive/obesity?:yes Statin if LDL >100?:yes  Still smoking up to 1/4-1/2 PPD.        Review of Systems - Negative except as noted above as HPI.   Objective:  Current Outpatient Prescriptions  Medication Sig Dispense Refill  . ACCU-CHEK FASTCLIX LANCETS MISC Inject 1 application into the skin 3 (three) times daily.  102 each  6  . aspirin 325 MG tablet Take 1 tablet (325 mg total) by mouth daily.  100 tablet  3  . Blood Glucose Monitoring Suppl (ACCU-CHEK AVIVA PLUS) W/DEVICE KIT       . carvedilol (COREG) 25 MG tablet Take 1 tablet (25 mg total) by mouth 2 (two) times daily with a meal.  60 tablet  0  . furosemide (LASIX) 20 MG tablet Take 1 tablet (20 mg total) by mouth 2 (two) times daily.  60 tablet  6  . glucose blood (ACCU-CHEK AVIVA PLUS) test strip Check blood sugars TID  100 each  6  . insulin aspart (NOVOLOG) 100 UNIT/ML injection Inject 0-15 units into the skin 3 times daily, with meals, as follows: CBG 70-120, no Insulin. CBG 120-151, 2 units. CBG 151-200, 3 units.  CBG 201-250, 5 units. CBG 251-300, 8 units. CBG 301-350, 11 units.   CBG 351-400, 15 units.  1 vial  0  . insulin glargine (LANTUS) 100 UNIT/ML injection Inject 34 Units into the skin at bedtime. Changed to Lantus Solostar at patient's request.  10 mL  0  . Insulin Syringes, Disposable, U-100 0.5 ML MISC Use 3 times daily with novolog  100 each  6  . lisinopril (PRINIVIL,ZESTRIL) 30 MG tablet Take 1 tablet (30 mg total) by mouth daily.  30 tablet  6  . metFORMIN (GLUCOPHAGE) 1000 MG tablet Take 1 tablet (1,000  mg total) by mouth 2 (two) times daily with a meal.  180 tablet  3  . potassium chloride SA (K-DUR,KLOR-CON) 20 MEQ tablet Take 1 tablet (20 mEq total) by mouth daily.  30 tablet  6  . rosuvastatin (CRESTOR) 20 MG tablet Take 1 tablet (20 mg total) by mouth daily at 6 PM.  30 tablet  6  . Syringe/Needle, Disp, (PRODIGY SAFETY SYRINGES) 29G X 1/2" 1 ML MISC Use 3 times daily with novolog  100 each  6    Wt Readings from Last 3 Encounters:  08/25/11 232 lb (105.235 kg)  07/15/11 234 lb (106.142 kg)  06/24/11 244 lb (110.678 kg)   Temp Readings from Last 3 Encounters:  05/14/11 98 F (36.7 C) Oral  05/14/11 98 F (36.7 C) Oral   BP Readings from Last 3 Encounters:  08/25/11 149/81  07/15/11 133/78  06/24/11 168/93   Pulse Readings from Last 3 Encounters:  08/25/11 92  07/15/11 80  06/24/11 85    General: alert and cooperative HEENT: PERRLA and extra ocular movement intact Heart: S1, S2 normal, no murmur, rub or gallop, regular rate and rhythm Lungs: clear to auscultation, no wheezes or rales and unlabored breathing Abdomen: abdomen is soft without significant tenderness, masses, organomegaly or guarding Extremities: extremities normal, atraumatic, no cyanosis or edema Skin:no rashes Neurology: normal without focal findings   Assessment/Plan:

## 2011-08-26 NOTE — Assessment & Plan Note (Signed)
Mildly elevated. Will increase lisinopril to 40mg  daily. Next step would be addition of CCB (norvasc) to regimen vs. Hydralazine. Will check Cr and K.

## 2011-08-27 ENCOUNTER — Other Ambulatory Visit: Payer: Self-pay | Admitting: Family Medicine

## 2011-08-29 ENCOUNTER — Other Ambulatory Visit: Payer: Self-pay | Admitting: Family Medicine

## 2011-08-31 ENCOUNTER — Other Ambulatory Visit: Payer: Medicare Other

## 2011-08-31 DIAGNOSIS — I10 Essential (primary) hypertension: Secondary | ICD-10-CM

## 2011-08-31 LAB — BASIC METABOLIC PANEL
BUN: 25 mg/dL — ABNORMAL HIGH (ref 6–23)
Chloride: 109 mEq/L (ref 96–112)
Creat: 1.44 mg/dL — ABNORMAL HIGH (ref 0.50–1.35)

## 2011-08-31 NOTE — Progress Notes (Signed)
BMP DONE TODAY Shuan Statzer 

## 2011-10-27 ENCOUNTER — Other Ambulatory Visit: Payer: Self-pay | Admitting: Family Medicine

## 2012-02-03 ENCOUNTER — Other Ambulatory Visit: Payer: Self-pay | Admitting: Family Medicine

## 2012-02-03 NOTE — Telephone Encounter (Signed)
No paper chart °

## 2012-05-16 ENCOUNTER — Ambulatory Visit (INDEPENDENT_AMBULATORY_CARE_PROVIDER_SITE_OTHER): Payer: Medicare Other | Admitting: Family Medicine

## 2012-05-16 ENCOUNTER — Telehealth: Payer: Self-pay | Admitting: *Deleted

## 2012-05-16 ENCOUNTER — Encounter: Payer: Self-pay | Admitting: Family Medicine

## 2012-05-16 VITALS — BP 174/72 | HR 87 | Temp 98.9°F | Ht 71.0 in | Wt 228.3 lb

## 2012-05-16 DIAGNOSIS — E119 Type 2 diabetes mellitus without complications: Secondary | ICD-10-CM

## 2012-05-16 DIAGNOSIS — F17200 Nicotine dependence, unspecified, uncomplicated: Secondary | ICD-10-CM

## 2012-05-16 DIAGNOSIS — I10 Essential (primary) hypertension: Secondary | ICD-10-CM

## 2012-05-16 DIAGNOSIS — R911 Solitary pulmonary nodule: Secondary | ICD-10-CM

## 2012-05-16 DIAGNOSIS — E785 Hyperlipidemia, unspecified: Secondary | ICD-10-CM

## 2012-05-16 LAB — BASIC METABOLIC PANEL
BUN: 22 mg/dL (ref 6–23)
Chloride: 105 mEq/L (ref 96–112)
Potassium: 4.8 mEq/L (ref 3.5–5.3)

## 2012-05-16 LAB — LIPID PANEL
Cholesterol: 190 mg/dL (ref 0–200)
HDL: 39 mg/dL — ABNORMAL LOW (ref >39)
LDL Cholesterol: 133 mg/dL — ABNORMAL HIGH (ref 0–99)
Triglycerides: 90 mg/dL (ref <150)
VLDL: 18 mg/dL (ref 0–40)

## 2012-05-16 LAB — POCT GLYCOSYLATED HEMOGLOBIN (HGB A1C): Hemoglobin A1C: 7

## 2012-05-16 MED ORDER — CARVEDILOL 25 MG PO TABS: 25.0000 mg | ORAL_TABLET | Freq: Two times a day (BID) | ORAL | Status: DC

## 2012-05-16 MED ORDER — GLUCOSE BLOOD VI STRP: ORAL_STRIP | Status: DC

## 2012-05-16 NOTE — Telephone Encounter (Signed)
Message given

## 2012-05-16 NOTE — Assessment & Plan Note (Addendum)
A1c shows good control.  Plan no meds for now, Q3 month a1c, restart orals if higher than 7.  BMET for monitoring today.

## 2012-05-16 NOTE — Assessment & Plan Note (Addendum)
Recheck cholesterol today.  Will contact pt about meds to restart.  Anticipate restarting as pt has had MI so goal would be <70 for LDL.

## 2012-05-16 NOTE — Assessment & Plan Note (Signed)
Need to obtain CT chest for monitoring.

## 2012-05-16 NOTE — Progress Notes (Deleted)
Subjective:     Patient ID: Miguel Harvey, male   DOB: 10/29/1945, 67 y.o.   MRN: [id number]  HPI   Review of Systems     Objective:   Physical Exam     Assessment:     ***    Plan:     ***

## 2012-05-16 NOTE — Telephone Encounter (Signed)
LVM for patient to call back. Appointment scheduled for Tuesdasy 2/18 @ 10am patient to arrive @ 9:45am [facility] first floor.

## 2012-05-16 NOTE — Addendum Note (Signed)
Addended by: Farrell Ours on: 05/16/2012 02:18 PM   Modules accepted: Orders

## 2012-05-16 NOTE — Assessment & Plan Note (Signed)
Poor control today.  Will need f/u bp check in 1-2 weeks.

## 2012-05-16 NOTE — Assessment & Plan Note (Signed)
Not interested in quitting at this time

## 2012-05-16 NOTE — Patient Instructions (Signed)
It was good to see you today! I want you to start taking Coreg again for your blood pressure. Come in for a nurse visit next week to check your blood pressure. I am going to get some blood work and we will be in touch about what to do with your cholesterol medication. I want you to go to the radiology department for a ct scan of your chest.  Do this tomorrow. Come back to see me in about 4 weeks.

## 2012-05-16 NOTE — Progress Notes (Signed)
Patient ID: Miguel Harvey, male   DOB: 1946/01/27, 67 y.o.   MRN: [id number] Subjective: The patient is a 67 y.o. year old male who presents today for f/u.  1. DM: Patient reports that his blood sugar has been running in the low 100s. He is not taking all medications. He is also significant amount of weight and change his diet. He is not having any low blood sugars.  2. HTN: Patient is not taking any medications for his high blood pressure as he has run out of them. Is not having any chest pain, shortness of breath, lower extremity swelling, visual changes, or syncope.  3. Diastolic heart failure: Patient is weighing himself daily. Review of systems per #2.  4. Pulmonary nodule: Patient never followed up with a chest CT. Except for the fact that he continues to smoke one pack per day he is asymptomatic.  5. HLD: Patient is no longer taking Crestor. He has not had lipid panel in some time.  Patient's past medical, social, and family history were reviewed and updated as appropriate. History  Substance Use Topics  . Smoking status: Current Every Day Smoker -- 1.00 packs/day for 40 years    Types: Cigarettes  . Smokeless tobacco: Former Neurosurgeon    Quit date: 05/07/2011  . Alcohol Use: Not on file   Objective:  Filed Vitals:   05/16/12 0855  BP: 174/72  Pulse: 87  Temp: 98.9 F (37.2 C)   Gen: NAD CV: RRR, no murmurs Resp: Scattered wheezes and occasional ronchi, good air movement Ext: No edema, 2+ pulses  Assessment/Plan:  Please also see individual problems in problem list for problem-specific plans.

## 2012-05-17 ENCOUNTER — Telehealth: Payer: Self-pay | Admitting: Family Medicine

## 2012-05-17 ENCOUNTER — Ambulatory Visit (HOSPITAL_COMMUNITY)
Admission: RE | Admit: 2012-05-17 | Discharge: 2012-05-17 | Disposition: A | Discharge status: Home / Self Care | Payer: Medicare Other | Source: Ambulatory Visit | Attending: Family Medicine | Admitting: Family Medicine

## 2012-05-17 DIAGNOSIS — R918 Other nonspecific abnormal finding of lung field: Secondary | ICD-10-CM | POA: Insufficient documentation

## 2012-05-17 DIAGNOSIS — R911 Solitary pulmonary nodule: Secondary | ICD-10-CM | POA: Insufficient documentation

## 2012-05-17 MED ORDER — ATORVASTATIN CALCIUM 40 MG PO TABS: 40.0000 mg | ORAL_TABLET | Freq: Every day | ORAL | Status: DC

## 2012-05-17 NOTE — Telephone Encounter (Signed)
Pt.called back and message given

## 2012-05-17 NOTE — Telephone Encounter (Signed)
Unidentified voicemail.  Will send letter and rx for 40mg  lipitor.

## 2012-05-23 ENCOUNTER — Ambulatory Visit (INDEPENDENT_AMBULATORY_CARE_PROVIDER_SITE_OTHER): Payer: Medicare Other | Admitting: *Deleted

## 2012-05-23 VITALS — BP 142/72

## 2012-05-23 DIAGNOSIS — I10 Essential (primary) hypertension: Secondary | ICD-10-CM

## 2012-05-24 NOTE — Progress Notes (Signed)
Patient in for BP check . BP checked manually using regular adult cuff. BP LA 150/72 and RA 142/72 pulse 80.  Has appointment with PCP on 03/17

## 2012-06-13 ENCOUNTER — Encounter: Payer: Self-pay | Admitting: Family Medicine

## 2012-06-13 ENCOUNTER — Ambulatory Visit (INDEPENDENT_AMBULATORY_CARE_PROVIDER_SITE_OTHER): Payer: Medicare Other | Admitting: Family Medicine

## 2012-06-13 VITALS — BP 140/68 | HR 70 | Temp 99.1°F | Ht 71.0 in | Wt 237.0 lb

## 2012-06-13 DIAGNOSIS — E119 Type 2 diabetes mellitus without complications: Secondary | ICD-10-CM

## 2012-06-13 DIAGNOSIS — I10 Essential (primary) hypertension: Secondary | ICD-10-CM

## 2012-06-13 DIAGNOSIS — I428 Other cardiomyopathies: Secondary | ICD-10-CM

## 2012-06-13 LAB — IBC PANEL: %SAT: 20 % (ref 20–55)

## 2012-06-13 LAB — IRON: Iron: 70 ug/dL (ref 42–165)

## 2012-06-13 MED ORDER — CARVEDILOL 25 MG PO TABS: 25.0000 mg | ORAL_TABLET | Freq: Two times a day (BID) | ORAL | Status: DC

## 2012-06-13 MED ORDER — GLUCOSE BLOOD VI STRP: ORAL_STRIP | Status: DC

## 2012-06-13 NOTE — Patient Instructions (Signed)
It was good to see you today! We want to check your iron level to make certain you have enough iron.  If you do not, we will be adding an iron supplement.  This is for your heart problems. Please continue your other medications. Come back to see me in June

## 2012-06-13 NOTE — Assessment & Plan Note (Addendum)
Modifying goal per most recent JNC-8 guidelines.  Is at goal today.  No changes in meds.  Required manual recheck.

## 2012-06-13 NOTE — Assessment & Plan Note (Signed)
Check iron panel today for poss iron supplementation.

## 2012-06-13 NOTE — Progress Notes (Signed)
Patient ID: Miguel Harvey, male   DOB: [birth year], 67 y.o.   MRN: [id number] Subjective: The patient is a 67 y.o. year old male who presents today for htn f/u.  1. HTN: No cp/sob/doe/LE edema.  Taking meds.  2. CHF/CAD: Per #1.  3. Glucose intolerance: CBGs 100-120 at daily readings.  Not currently on any meds.  Last A1c 7.  Patient's past medical, social, and family history were reviewed and updated as appropriate. History  Substance Use Topics  . Smoking status: Current Every Day Smoker -- 1.00 packs/day for 40 years    Types: Cigarettes  . Smokeless tobacco: Former Neurosurgeon    Quit date: 05/07/2011  . Alcohol Use: Not on file   Objective:  Filed Vitals:   06/13/12 1045  BP: 140/68  Pulse: 70  Temp: 99.1 F (37.3 C)   Gen: NAD CV: RRR, no murmurs Resp: CTABL Ext: No edema  Assessment/Plan:  Please also see individual problems in problem list for problem-specific plans.

## 2012-06-13 NOTE — Assessment & Plan Note (Signed)
Cont periodic monitoring of A1c and daily CBGs.  Not on any meds currently and with most recent A1c, no need for them.

## 2012-09-02 ENCOUNTER — Encounter: Payer: Self-pay | Admitting: Family Medicine

## 2012-09-02 ENCOUNTER — Ambulatory Visit (INDEPENDENT_AMBULATORY_CARE_PROVIDER_SITE_OTHER): Payer: Medicare Other | Admitting: Family Medicine

## 2012-09-02 ENCOUNTER — Telehealth: Payer: Self-pay | Admitting: Family Medicine

## 2012-09-02 VITALS — BP 141/69 | HR 73 | Temp 99.0°F | Ht 71.0 in | Wt 235.0 lb

## 2012-09-02 DIAGNOSIS — I428 Other cardiomyopathies: Secondary | ICD-10-CM

## 2012-09-02 DIAGNOSIS — E785 Hyperlipidemia, unspecified: Secondary | ICD-10-CM

## 2012-09-02 DIAGNOSIS — E119 Type 2 diabetes mellitus without complications: Secondary | ICD-10-CM

## 2012-09-02 DIAGNOSIS — I10 Essential (primary) hypertension: Secondary | ICD-10-CM

## 2012-09-02 LAB — BASIC METABOLIC PANEL
BUN: 24 mg/dL — ABNORMAL HIGH (ref 6–23)
Chloride: 109 mEq/L (ref 96–112)
Potassium: 4.6 mEq/L (ref 3.5–5.3)
Sodium: 141 mEq/L (ref 135–145)

## 2012-09-02 MED ORDER — LISINOPRIL 5 MG PO TABS: 5.0000 mg | ORAL_TABLET | Freq: Every day | ORAL | Status: DC

## 2012-09-02 MED ORDER — ASPIRIN 325 MG PO TABS: 325.0000 mg | ORAL_TABLET | Freq: Every day | ORAL | Status: AC

## 2012-09-02 MED ORDER — CARVEDILOL 6.25 MG PO TABS: 6.2500 mg | ORAL_TABLET | Freq: Two times a day (BID) | ORAL | Status: DC

## 2012-09-02 MED ORDER — METFORMIN HCL 1000 MG PO TABS: 500.0000 mg | ORAL_TABLET | Freq: Two times a day (BID) | ORAL | Status: DC

## 2012-09-02 NOTE — Telephone Encounter (Signed)
OK, sounds good. 

## 2012-09-02 NOTE — Progress Notes (Signed)
Patient ID: Miguel Harvey, male   DOB: September 10, 1945, 67 y.o.   MRN: [id number] Subjective: The patient is a 67 y.o. year old male who presents today for followup.  1. Hypertension: The patient is no longer taking any medicine for blood pressure. He reports that he thought we would contact him to let him know if he was supposed to continue taking medicines. He denies any chest pain, shortness of breath, lower tremor the swelling, visual changes, headaches.  2. Diabetes: Patient reports that his blood sugars are running between one 2150. He is not taking any medication for his blood sugar. A1c today is worse than previous at 7.9.  3. Hyperlipidemia: The patient does continue to take his atorvastatin. He reports no problems with his medication. Problems with myalgias.  Patient's past medical, social, and family history were reviewed and updated as appropriate. History  Substance Use Topics  . Smoking status: Current Every Day Smoker -- 1.00 packs/day for 40 years    Types: Cigarettes  . Smokeless tobacco: Former Neurosurgeon    Quit date: 05/07/2011  . Alcohol Use: Not on file   Objective:  Filed Vitals:   09/02/12 0936  BP: 141/69  Pulse: 73  Temp: 99 F (37.2 C)   Gen: NAD CV:  Regular rate and rhythm Resp: Clear to auscultation bilaterally Ext: 2+ pulses, trace lower extremity edema  Assessment/Plan:  Please also see individual problems in problem list for problem-specific plans.

## 2012-09-02 NOTE — Telephone Encounter (Signed)
Will fwd to Md.  According to OV note, that would be the correct current dose.  Deveney Bayon, Darlyne Russian, CMA

## 2012-09-02 NOTE — Assessment & Plan Note (Addendum)
Patient is mildly hypertensive today. We will start back both lisinopril and Coreg. Lisinopril due to diabetes, Coreg due to nonischemic cardiomyopathy with low ejection fraction. Check BMET today and again in one week.

## 2012-09-02 NOTE — Assessment & Plan Note (Signed)
Continue atorvastatin at current dose 

## 2012-09-02 NOTE — Assessment & Plan Note (Signed)
Restart low-dose metformin. We'll be rechecking the patient's creatinine next week this is significantly elevated we'll have to stop this.

## 2012-09-02 NOTE — Telephone Encounter (Signed)
Patient would like to let Dr. Louanne Belton know that the Metformin he has is the 500mg  tablets, so he is going take 1 tablet twice daily.

## 2012-09-02 NOTE — Patient Instructions (Signed)
Please get blood work today and again in 1 week. Start back to taking Metformin.  Take 1/2 pill two times per day. I want you to start the medications lisinopril and carvedilol.  I have sent these medications in. Continue taking your atorvastatin. Get back to see me by the end of the month.

## 2012-09-09 ENCOUNTER — Other Ambulatory Visit: Payer: Medicare Other

## 2012-09-09 DIAGNOSIS — I10 Essential (primary) hypertension: Secondary | ICD-10-CM

## 2012-09-09 DIAGNOSIS — E119 Type 2 diabetes mellitus without complications: Secondary | ICD-10-CM

## 2012-09-09 LAB — BASIC METABOLIC PANEL
BUN: 22 mg/dL (ref 6–23)
Calcium: 9.1 mg/dL (ref 8.4–10.5)
Glucose, Bld: 182 mg/dL — ABNORMAL HIGH (ref 70–99)
Potassium: 4.6 mEq/L (ref 3.5–5.3)
Sodium: 137 mEq/L (ref 135–145)

## 2012-09-09 NOTE — Progress Notes (Signed)
BMP DONE TODAY [name] 

## 2012-09-21 ENCOUNTER — Ambulatory Visit (INDEPENDENT_AMBULATORY_CARE_PROVIDER_SITE_OTHER): Payer: Medicare Other | Admitting: Family Medicine

## 2012-09-21 ENCOUNTER — Encounter: Payer: Self-pay | Admitting: Family Medicine

## 2012-09-21 VITALS — BP 169/66 | HR 75 | Temp 99.7°F | Ht 71.0 in | Wt 230.0 lb

## 2012-09-21 DIAGNOSIS — E119 Type 2 diabetes mellitus without complications: Secondary | ICD-10-CM

## 2012-09-21 DIAGNOSIS — E785 Hyperlipidemia, unspecified: Secondary | ICD-10-CM

## 2012-09-21 DIAGNOSIS — I10 Essential (primary) hypertension: Secondary | ICD-10-CM

## 2012-09-21 MED ORDER — ENALAPRIL-HYDROCHLOROTHIAZIDE 10-25 MG PO TABS: 1.0000 | ORAL_TABLET | Freq: Every day | ORAL | Status: DC

## 2012-09-21 NOTE — Patient Instructions (Addendum)
You can continue your lisinopril until you run out.  Do not start your new blood pressure medication (Vaseretic) until your lisinopril runs out. You should get back in to see [place] in about 3 weeks.  We are going to check your kidney function again today.

## 2012-09-22 LAB — BASIC METABOLIC PANEL
Calcium: 9.1 mg/dL (ref 8.4–10.5)
Glucose, Bld: 139 mg/dL — ABNORMAL HIGH (ref 70–99)
Potassium: 5.2 mEq/L (ref 3.5–5.3)
Sodium: 140 mEq/L (ref 135–145)

## 2012-09-24 NOTE — Assessment & Plan Note (Signed)
Tolerating metformin well.  Cont current dose until next A1c due.  Can likely get A1c<7 on orals at this time.

## 2012-09-24 NOTE — Progress Notes (Signed)
Patient ID: PORFIRIO BOLLIER, male   DOB: 02-Mar-1946, 67 y.o.   MRN: [id number] Subjective: The patient is a 67 y.o. year old male who presents today for f/u.  1. DM: Taking metformin, no problems.  No hypoglycemia.  2. HTN: Taking meds, no cp/sob/doe, no LE edema  3. HLD: No problems with myalgias.  Talking lipitor without problems.  Patient's past medical, social, and family history were reviewed and updated as appropriate. History  Substance Use Topics  . Smoking status: Current Every Day Smoker -- 1.00 packs/day for 40 years    Types: Cigarettes  . Smokeless tobacco: Former Neurosurgeon    Quit date: 05/07/2011  . Alcohol Use: Not on file   Objective:  Filed Vitals:   09/21/12 1048  BP: 169/66  Pulse: 75  Temp: 99.7 F (37.6 C)   Gen: NAD CV: RRR, no murmurs Resp: CTABL Ext: No edema  Assessment/Plan:  Please also see individual problems in problem list for problem-specific plans.

## 2012-09-24 NOTE — Assessment & Plan Note (Signed)
Add HCTZ in combo pill, f/u 3-4 weeks.  Likely have to increase dose then.

## 2012-09-24 NOTE — Assessment & Plan Note (Signed)
Pt back to taking lipitor after neglecting to take it for a while.  Tolerating well.  FLP in 1-2 months.

## 2012-10-06 ENCOUNTER — Other Ambulatory Visit: Payer: Self-pay

## 2012-10-11 ENCOUNTER — Ambulatory Visit (INDEPENDENT_AMBULATORY_CARE_PROVIDER_SITE_OTHER): Payer: Medicare Other | Admitting: Family Medicine

## 2012-10-11 ENCOUNTER — Encounter: Payer: Self-pay | Admitting: Family Medicine

## 2012-10-11 VITALS — BP 140/80 | HR 64 | Temp 99.1°F | Ht 71.0 in | Wt 229.9 lb

## 2012-10-11 DIAGNOSIS — E785 Hyperlipidemia, unspecified: Secondary | ICD-10-CM

## 2012-10-11 DIAGNOSIS — E119 Type 2 diabetes mellitus without complications: Secondary | ICD-10-CM

## 2012-10-11 DIAGNOSIS — I10 Essential (primary) hypertension: Secondary | ICD-10-CM

## 2012-10-11 NOTE — Patient Instructions (Signed)
Miguel Harvey, it was nice seeing you today.  We will see you back in three months to recheck your diabetes and high blood pressure.  Please call if you need any prescriptions between then.  Thanks, Dr. Paulina Fusi

## 2012-10-11 NOTE — Assessment & Plan Note (Addendum)
BP on recheck 140/80 today.  Will stay at his current dose of 10-25 Vaseretic.  May need to increase in future.  Otherwise will see back in three months and can repeat BMET at that time.

## 2012-10-11 NOTE — Progress Notes (Signed)
Miguel Harvey is a 67 y.o. male who presents today for HTN recheck.  He was recently switched to Vasoretic and had started this medication about two weeks ago.  No complaints at this time and denies any cough, edema, hypotension, palpitations.  BP well controlled at 140/80 today.    HLD - Stable, taking Lipitor w/o myalgias, compliant with medication.  DM II - Stable, taking metformin, compliant with medication w/ no GI SE described at this time.  Last A1C 6/9 measured at 6.9.  Past Medical History  Diagnosis Date  . Hypertension   . Diabetes mellitus   . CHF (congestive heart failure)     History  Smoking status  . Current Every Day Smoker -- 1.00 packs/day for 40 years  . Types: Cigarettes  Smokeless tobacco  . Former Neurosurgeon  . Quit date: 05/07/2011    No family history on file.  Current Outpatient Prescriptions on File Prior to Visit  Medication Sig Dispense Refill  . ACCU-CHEK FASTCLIX LANCETS MISC Inject 1 application into the skin 3 (three) times daily.  102 each  6  . aspirin 325 MG tablet Take 1 tablet (325 mg total) by mouth daily.  100 tablet  3  . atorvastatin (LIPITOR) 40 MG tablet Take 1 tablet (40 mg total) by mouth daily.  90 tablet  3  . Blood Glucose Monitoring Suppl (ACCU-CHEK AVIVA PLUS) W/DEVICE KIT       . carvedilol (COREG) 6.25 MG tablet Take 1 tablet (6.25 mg total) by mouth 2 (two) times daily with a meal.  60 tablet  1  . enalapril-hydrochlorothiazide (VASERETIC) 10-25 MG per tablet Take 1 tablet by mouth daily.  30 tablet  3  . glucose blood (ACCU-CHEK AVIVA PLUS) test strip Check blood sugars daily  100 each  6  . metFORMIN (GLUCOPHAGE) 1000 MG tablet Take 0.5 tablets (500 mg total) by mouth 2 (two) times daily with a meal.  180 tablet  3   No current facility-administered medications on file prior to visit.    ROS: Per HPI.  All other systems reviewed and are negative.   Physical Exam Filed Vitals:   10/11/12 1653  BP: 140/80  Pulse:   Temp:      Physical Examination: General appearance - alert, well appearing, and in no distress Chest - clear to auscultation, no wheezes, rales or rhonchi, symmetric air entry Heart - normal rate and regular rhythm, no murmurs noted    Chemistry      Component Value Date/Time   NA 140 09/21/2012 1150   K 5.2 09/21/2012 1150   CL 108 09/21/2012 1150   CO2 23 09/21/2012 1150   BUN 25* 09/21/2012 1150   CREATININE 1.43* 09/21/2012 1150   CREATININE 0.91 05/14/2011 0625      Component Value Date/Time   CALCIUM 9.1 09/21/2012 1150   ALKPHOS 65 08/25/2011 1155   AST 18 08/25/2011 1155   ALT 19 08/25/2011 1155   BILITOT 0.3 08/25/2011 1155     Lab Results  Component Value Date   HGBA1C 7.9 09/02/2012

## 2012-10-11 NOTE — Assessment & Plan Note (Signed)
Stable, taking Lipitor at this time.  Will recheck FLP in three months.

## 2012-10-11 NOTE — Assessment & Plan Note (Signed)
Stable, tolerating Metformin well.  Last A1C on 09/05/12 was 7.9.  Will recheck in three months.

## 2012-11-01 ENCOUNTER — Other Ambulatory Visit: Payer: Self-pay | Admitting: Family Medicine

## 2012-11-01 NOTE — Telephone Encounter (Signed)
Pt is calling for refill on  metformin > he tried to get a refill through the pharmacy and was told to call his doctor. JW

## 2012-11-01 NOTE — Telephone Encounter (Signed)
Will need follow up appt after this refill

## 2012-11-01 NOTE — Telephone Encounter (Signed)
Chart error.  Is family practice residency pt.  Will not refill metformin. She will likely need appt given cr.

## 2012-11-02 ENCOUNTER — Telehealth: Payer: Self-pay | Admitting: Family Medicine

## 2012-11-02 NOTE — Telephone Encounter (Signed)
Pt needs refill on Metformin and received a note from the pharmacist that the Rx was denied and to  Call the MD for refill.  The doctor on the Rx was Dr. Alvester Morin.  Will fwd to Dr. Paulina Fusi and clarify if pt is to continue Metformin.  I will call pharmacy and pt with response.   Nkenge Sonntag, Darlyne Russian, CMA

## 2012-11-02 NOTE — Telephone Encounter (Signed)
Spoke with Dr. Paulina Fusi, OK to refill Metformin.  Called Metformin 500 mg one tab twice a day  Disp #[id number] with 1 RF.Mariely Mahr, Darlyne Russian, CMA

## 2012-11-02 NOTE — Telephone Encounter (Signed)
Pt is retuning Dr. Paulina Fusi call. JW

## 2013-01-02 ENCOUNTER — Other Ambulatory Visit: Payer: Self-pay | Admitting: Family Medicine

## 2013-01-03 ENCOUNTER — Other Ambulatory Visit: Payer: Self-pay | Admitting: Family Medicine

## 2013-01-03 ENCOUNTER — Telehealth: Payer: Self-pay | Admitting: Family Medicine

## 2013-01-03 MED ORDER — ATORVASTATIN CALCIUM 40 MG PO TABS: 40.0000 mg | ORAL_TABLET | Freq: Every day | ORAL | Status: DC

## 2013-01-03 MED ORDER — ENALAPRIL-HYDROCHLOROTHIAZIDE 10-25 MG PO TABS: 1.0000 | ORAL_TABLET | Freq: Every day | ORAL | Status: DC

## 2013-01-03 MED ORDER — CARVEDILOL 6.25 MG PO TABS: ORAL_TABLET | ORAL | Status: DC

## 2013-01-03 NOTE — Telephone Encounter (Signed)
Pt is requesting refills on all his current medications sent to his pharmacy. JW

## 2013-01-03 NOTE — Telephone Encounter (Signed)
Will forward to Dr Hess 

## 2013-01-03 NOTE — Telephone Encounter (Signed)
Completed.  I did not refill his metformin as his last Creatinine was borderline and will need to repeat prior to doing this.  Thanks, Twana First. Paulina Fusi, DO of [facility] 01/03/2013, 5:20 PM

## 2013-01-04 NOTE — Telephone Encounter (Signed)
Pt informed of Dr Paulina Fusi msg. Voiced understanding.

## 2013-01-27 ENCOUNTER — Telehealth: Payer: Self-pay | Admitting: Family Medicine

## 2013-01-27 NOTE — Telephone Encounter (Signed)
Andree Moro called from [employer] and would like to know the last date of Miguel Harvey physical and the results faxed to him at [phone]. He said that [name] should have signed papers saying that this is okay. JW

## 2013-01-31 NOTE — Telephone Encounter (Signed)
Pt called back. He doesn't know anything about [employer] [facility]'s request.

## 2013-01-31 NOTE — Telephone Encounter (Signed)
Left message for patient to return call and clarify what were needing to fax.Looking at his chart there is no office visit that shows he had  a physical.. Kalley Nicholl, Virgel Bouquet

## 2013-02-02 NOTE — Telephone Encounter (Signed)
After looking at patient Insurance ,I left message on patient voicemail requesting he schedule a physical and to contact Mr Andree Moro at [facility] [facility],he's number is  on the back of his Insurance card and try to clarify why they called [place] requesting and stating he gave [place]  permission to fax his medical info. if still having question to return my call.Zackerie Sara, Virgel Bouquet

## 2013-03-30 DEATH — deceased

## 2013-05-08 ENCOUNTER — Telehealth: Payer: Self-pay | Admitting: *Deleted

## 2013-05-08 NOTE — Telephone Encounter (Signed)
Received message from MD/Rx regarding pt's most recent Hgb A1C.  Last Hgb A1C was 7.9 from 09/02/2012.  Message left on RN voicemail.  Clovis Pu, RN

## 2013-07-31 ENCOUNTER — Other Ambulatory Visit: Payer: Self-pay | Admitting: Family Medicine

## 2013-09-06 ENCOUNTER — Other Ambulatory Visit: Payer: Self-pay | Admitting: *Deleted

## 2013-09-06 MED ORDER — ENALAPRIL-HYDROCHLOROTHIAZIDE 10-25 MG PO TABS: 1.0000 | ORAL_TABLET | Freq: Every day | ORAL | Status: DC

## 2013-09-06 MED ORDER — CARVEDILOL 6.25 MG PO TABS: ORAL_TABLET | ORAL | Status: DC

## 2013-09-06 NOTE — Telephone Encounter (Signed)
Pt request 90 day supply for the enalapril-HCTZ.  Clovis Pu, RN

## 2013-09-11 ENCOUNTER — Other Ambulatory Visit: Payer: Self-pay | Admitting: *Deleted

## 2013-09-11 MED ORDER — CARVEDILOL 6.25 MG PO TABS: ORAL_TABLET | ORAL | Status: DC

## 2013-09-11 MED ORDER — ENALAPRIL-HYDROCHLOROTHIAZIDE 10-25 MG PO TABS: 1.0000 | ORAL_TABLET | Freq: Every day | ORAL | Status: DC

## 2013-09-11 NOTE — Telephone Encounter (Signed)
Request is for 90 day supply as required per pt insurance.  Clovis Pu, RN

## 2013-12-20 ENCOUNTER — Telehealth: Payer: Self-pay | Admitting: Home Health Services

## 2013-12-20 ENCOUNTER — Encounter: Payer: Self-pay | Admitting: Home Health Services

## 2013-12-20 NOTE — Telephone Encounter (Signed)
LM time to schedule follow up visit for DM with PCP.  Has not been seen in the past year.  Rosalita Chessman

## 2014-01-03 ENCOUNTER — Other Ambulatory Visit: Payer: Self-pay | Admitting: *Deleted

## 2014-01-03 MED ORDER — ATORVASTATIN CALCIUM 40 MG PO TABS: 40.0000 mg | ORAL_TABLET | Freq: Every day | ORAL | Status: DC

## 2014-01-11 ENCOUNTER — Telehealth: Payer: Self-pay | Admitting: Home Health Services

## 2014-01-11 NOTE — Telephone Encounter (Signed)
LM time to schedule annual physical with PCP.

## 2014-03-08 ENCOUNTER — Encounter (HOSPITAL_COMMUNITY): Payer: Self-pay | Admitting: Cardiovascular Disease

## 2014-05-01 ENCOUNTER — Other Ambulatory Visit: Payer: Self-pay | Admitting: *Deleted

## 2014-05-01 MED ORDER — CARVEDILOL 6.25 MG PO TABS: ORAL_TABLET | ORAL | Status: DC

## 2014-05-21 ENCOUNTER — Telehealth: Payer: Self-pay | Admitting: Home Health Services

## 2014-05-21 ENCOUNTER — Encounter: Payer: Self-pay | Admitting: Home Health Services

## 2014-05-21 NOTE — Telephone Encounter (Signed)
LVM time to schedule appointment with PCP for DM management.  

## 2014-05-22 ENCOUNTER — Encounter: Payer: Self-pay | Admitting: Home Health Services

## 2014-05-22 NOTE — Progress Notes (Signed)
This encounter was created in error - please disregard.

## 2014-06-12 ENCOUNTER — Telehealth: Payer: Self-pay | Admitting: *Deleted

## 2014-06-12 NOTE — Telephone Encounter (Signed)
Left message on voicemail for patient to call back and schedule appointment for DM management

## 2014-09-11 ENCOUNTER — Other Ambulatory Visit: Payer: Self-pay | Admitting: *Deleted

## 2014-09-11 MED ORDER — ENALAPRIL-HYDROCHLOROTHIAZIDE 10-25 MG PO TABS: 1.0000 | ORAL_TABLET | Freq: Every day | ORAL | Status: DC

## 2014-09-11 MED ORDER — GLUCOSE BLOOD VI STRP: ORAL_STRIP | Status: DC

## 2014-09-21 ENCOUNTER — Telehealth: Payer: Self-pay | Admitting: *Deleted

## 2014-09-21 MED ORDER — ENALAPRIL-HYDROCHLOROTHIAZIDE 10-25 MG PO TABS: 1.0000 | ORAL_TABLET | Freq: Every day | ORAL | Status: DC

## 2014-09-21 NOTE — Telephone Encounter (Addendum)
Received a call from Weston Brass from [employer] requesting to change the quantity to #90 with 1 refill instead of #30 with 5 refills.  Changing to #90 would be a better cost for the patient.  Call with questions to [phone]. Clovis Pu, RN   Completed.  Thanks, Judie Grieve

## 2015-01-02 ENCOUNTER — Other Ambulatory Visit: Payer: Self-pay | Admitting: *Deleted

## 2015-01-02 MED ORDER — ATORVASTATIN CALCIUM 40 MG PO TABS: 40.0000 mg | ORAL_TABLET | Freq: Every day | ORAL | Status: DC

## 2015-01-02 NOTE — Telephone Encounter (Signed)
Will give patient 30 day supply of Lipitor. He has not been seen at our clinic in 2 years. Will need to schedule an appointment before next refill.

## 2015-01-03 NOTE — Telephone Encounter (Signed)
LM for patient to contact clinic to schedule an appt with his pcp. Hema Lanza,CMA

## 2015-04-02 ENCOUNTER — Other Ambulatory Visit: Payer: Self-pay | Admitting: Family Medicine

## 2015-04-03 NOTE — Telephone Encounter (Signed)
Patient has not been seen at our clinic since 2014. Will need an appointment in order to refill medications. Will send letter to patient to call and schedule an appointment.   Anders Simmonds, MD [facility], PGY-1

## 2015-07-01 ENCOUNTER — Other Ambulatory Visit: Payer: Self-pay | Admitting: Family Medicine

## 2015-07-01 NOTE — Telephone Encounter (Signed)
Patient has not been seen in clinic since 2014. Will not refill Lipitor at this time. Please call patient and request he schedule an appointment to discuss medication refills. Thank you.

## 2015-07-11 NOTE — Telephone Encounter (Signed)
LVM for pt to call the office. If he calls, please make him an apt to meet with Dr. Jonathon Jordan. Medication will not be refilled until he comes in. Sunday Spillers, CMA

## 2015-07-30 NOTE — Telephone Encounter (Signed)
Tried to contact pt and phone only rang with no option to LVM.  If pt returns call just wanted to see about scheduling him for an office visit. Miguel Harvey, [name], [place]

## 2015-09-29 ENCOUNTER — Other Ambulatory Visit: Payer: Self-pay | Admitting: Family Medicine

## 2015-09-30 NOTE — Telephone Encounter (Signed)
Called patient, no answer. No option to leave voicemail. Will not refill prescriptions at this time as patient has not been seen in clinic since 2014. Please call patient and let him know he will need to be seen at our clinic in order to receive medications.

## 2015-10-02 NOTE — Telephone Encounter (Signed)
LVM for pt to call us at his earliest opportunity. If calls, please make an appt for him to have an annual exam. See note below. Sunday Spillers, CMA

## 2015-12-28 ENCOUNTER — Other Ambulatory Visit: Payer: Self-pay | Admitting: Family Medicine

## 2015-12-31 NOTE — Telephone Encounter (Signed)
This is your patient, see med refill request 

## 2016-01-01 NOTE — Telephone Encounter (Signed)
Refill requested. Patient has not been seen in our clinic since 2014. [facility] team please call patient to schedule an appt to be seen in order to receive refills.

## 2016-01-03 NOTE — Telephone Encounter (Signed)
Rx filled on 01/01/2016. Sunday Spillers, CMA

## 2016-12-23 ENCOUNTER — Emergency Department (HOSPITAL_COMMUNITY)
Admission: EM | Admit: 2016-12-23 | Discharge: 2016-12-23 | Disposition: A | Discharge status: Home / Self Care | Payer: Medicare Other | Attending: Emergency Medicine | Admitting: Emergency Medicine

## 2016-12-23 ENCOUNTER — Encounter (HOSPITAL_COMMUNITY): Payer: Self-pay | Admitting: Emergency Medicine

## 2016-12-23 DIAGNOSIS — I5032 Chronic diastolic (congestive) heart failure: Secondary | ICD-10-CM | POA: Insufficient documentation

## 2016-12-23 DIAGNOSIS — I110 Hypertensive heart disease with heart failure: Secondary | ICD-10-CM | POA: Diagnosis not present

## 2016-12-23 DIAGNOSIS — Z7984 Long term (current) use of oral hypoglycemic drugs: Secondary | ICD-10-CM | POA: Diagnosis not present

## 2016-12-23 DIAGNOSIS — F17210 Nicotine dependence, cigarettes, uncomplicated: Secondary | ICD-10-CM | POA: Diagnosis not present

## 2016-12-23 DIAGNOSIS — R040 Epistaxis: Secondary | ICD-10-CM | POA: Insufficient documentation

## 2016-12-23 DIAGNOSIS — Z79899 Other long term (current) drug therapy: Secondary | ICD-10-CM | POA: Diagnosis not present

## 2016-12-23 DIAGNOSIS — E785 Hyperlipidemia, unspecified: Secondary | ICD-10-CM | POA: Diagnosis not present

## 2016-12-23 DIAGNOSIS — E119 Type 2 diabetes mellitus without complications: Secondary | ICD-10-CM | POA: Insufficient documentation

## 2016-12-23 LAB — CBC
HEMATOCRIT: 37.4 % — AB (ref 39.0–52.0)
HEMOGLOBIN: 12.3 g/dL — AB (ref 13.0–17.0)
MCH: 27.3 pg (ref 26.0–34.0)
MCHC: 32.9 g/dL (ref 30.0–36.0)
MCV: 83.1 fL (ref 78.0–100.0)
Platelets: 244 10*3/uL (ref 150–400)
RBC: 4.5 MIL/uL (ref 4.22–5.81)
RDW: 15 % (ref 11.5–15.5)
WBC: 7.2 10*3/uL (ref 4.0–10.5)

## 2016-12-23 MED ORDER — OXYMETAZOLINE HCL 0.05 % NA SOLN
1.0000 | Freq: Once | NASAL | Status: AC
  Administered 2016-12-23: 1 via NASAL
  Filled 2016-12-23: qty 15

## 2016-12-23 NOTE — ED Notes (Signed)
Patient stated his left nostril started bleeding yesterday but it only lasted for a couple of minutes.  Stated he helped his neighbor up off the floor and thought that is why it started bleeding.  Stated bleeding this morning but only lasted a short time.  Then it started bleeding again about 2pm and he called his daughter to bring him to the ED.  Upon arrival his nostril was not bleeding

## 2016-12-23 NOTE — ED Provider Notes (Signed)
[facility] DEPT Provider Note   CSN: [id number] Arrival date & time: 12/23/16  1602     History   Chief Complaint Chief Complaint  Patient presents with  . Epistaxis    HPI Miguel Harvey is a 71 y.o. male.  71 year old male with a history of CHF, diabetes, hypertension presents to the emergency department for a nosebleed. Patient reports having a nosebleed 3-4 times over the past 48 hours. Symptoms initially began 2 days ago and subsided yesterday. He had an episode of rebleeding this morning as well as just prior to arrival. Bleeding last for proximally 5-10 minutes before spontaneously resolving. He notes holding pressure to help the bleeding subsided. Patient is on a 324 mg aspirin daily. He denies the use of other chronic blood thinners. No medications taken prior to arrival for symptoms. Patient denies recent trauma, congestion, seasonal allergies, or manual manipulation. No recurrence of bleed since arrival at ~4pm.   The history is provided by the patient. No language interpreter was used.  Epistaxis      Past Medical History:  Diagnosis Date  . CHF (congestive heart failure) ([place])   . Diabetes mellitus   . Hypertension     Patient Active Problem List   Diagnosis Date Noted  . Pulmonary nodule 07/01/2011  . Acute diastolic heart failure, 2D EF 50-55% with grade 2 diastloic dysfunction May 19, 2011  . Family history of early CAD, brother died 76 CAD May 19, 2011  . Dyslipidemia May 19, 2011  . NICM, minor CAD at cath, EF 25% at cath, 55% by 2D 2011-05-19  . Diabetes mellitus ([place]) 05/07/2011  . HTN (hypertension), malignant 05/07/2011  . TOBACCO DEPENDENCE 05/27/2006    Past Surgical History:  Procedure Laterality Date  . cyst removal from  right leg about 6 years ago    . LEFT HEART CATHETERIZATION WITH CORONARY ANGIOGRAM N/A 05/11/2011   Procedure: LEFT HEART CATHETERIZATION WITH CORONARY ANGIOGRAM;  Surgeon: Troy Sine, MD;  Location: [facility] CATH LAB;  Service:  Cardiovascular;  Laterality: N/A;       Home Medications    Prior to Admission medications   Medication Sig Start Date End Date Taking? Authorizing Provider  ACCU-CHEK FASTCLIX LANCETS MISC Inject 1 application into the skin 3 (three) times daily. 07/15/11   Deneise Lever, MD  atorvastatin (LIPITOR) 40 MG tablet take 1 tablet by mouth once daily 12/30/15   Carlyle Dolly, MD  Blood Glucose Monitoring Suppl (ACCU-CHEK AVIVA PLUS) W/DEVICE KIT  05/15/11   [provider]  carvedilol (COREG) 6.25 MG tablet take 1 tablet by mouth twice a day with meals 05/01/14   Hess, Tamela Oddi, DO  enalapril-hydrochlorothiazide (VASERETIC) 10-25 MG tablet take 1 tablet by mouth once daily 01/01/16   Carlyle Dolly, MD  glucose blood (ACCU-CHEK AVIVA PLUS) test strip Check blood sugars daily 06/13/12   Vivi Ferns, MD  glucose blood (ACCU-CHEK AVIVA PLUS) test strip CHECK BLOOD SUGAR 3 TIMES A DAY 09/11/14   Hess, Tamela Oddi, DO  metFORMIN (GLUCOPHAGE) 1000 MG tablet Take 0.5 tablets (500 mg total) by mouth 2 (two) times daily with a meal. 09/02/12   Ritch, Suella Broad, MD    Family History No family history on file.  Social History Social History  Substance Use Topics  . Smoking status: Current Every Day Smoker    Packs/day: 2.00    Years: 40.00    Types: Cigarettes  . Smokeless tobacco: Former Systems developer    Quit date: 05/07/2011  . Alcohol use  3.6 oz/week    6 Cans of beer per week     Allergies   Patient has no known allergies.   Review of Systems Review of Systems  HENT: Positive for nosebleeds.   Ten systems reviewed and are negative for acute change, except as noted in the HPI.    Physical Exam Updated Vital Signs BP 128/76   Pulse 97   Temp 98.2 F (36.8 C) (Oral)   Resp 18   Ht 6' (1.829 m)   Wt 102.1 kg (225 lb)   SpO2 99%   BMI 30.52 kg/m   Physical Exam  Constitutional: He is oriented to person, place, and time. He appears well-developed and well-nourished. No  distress.  Nontoxic and in NAD  HENT:  Head: Normocephalic and atraumatic.  Macerated capillary and blood clot to the medial inferior aspect of the left nare. Nares patent. No septal deviation or hematoma.  Eyes: Conjunctivae and EOM are normal. No scleral icterus.  Neck: Normal range of motion.  Cardiovascular: Normal rate, regular rhythm and intact distal pulses.   Pulmonary/Chest: Effort normal. No respiratory distress.  Respirations even and unlabored  Musculoskeletal: Normal range of motion.  Neurological: He is alert and oriented to person, place, and time. He exhibits normal muscle tone. Coordination normal.  GCS 15. Speech is goal oriented. Patient moving all extremities.  Skin: Skin is warm and dry. No rash noted. He is not diaphoretic. No erythema. No pallor.  Psychiatric: He has a normal mood and affect. His behavior is normal.  Nursing note and vitals reviewed.    ED Treatments / Results  Labs (all labs ordered are listed, but only abnormal results are displayed) Labs Reviewed  CBC - Abnormal; Notable for the following:       Result Value   Hemoglobin 12.3 (*)    HCT 37.4 (*)    All other components within normal limits    EKG  EKG Interpretation None       Radiology No results found.  Procedures Procedures (including critical care time)  Medications Ordered in ED Medications  oxymetazoline (AFRIN) 0.05 % nasal spray 1 spray (not administered)     Initial Impression / Assessment and Plan / ED Course  I have reviewed the triage vital signs and the nursing notes.  Pertinent labs & imaging results that were available during my care of the patient were reviewed by me and considered in my medical decision making (see chart for details).     71 year old male presents to the emergency department for evaluation of epistaxis. He has had intermittent epistaxis over the past 48 hours. He has not had any repeat bleeding over the past 6 hours, since ED arrival.  Site of bleeding appears to be a macerated capillary in the anterior left medial nare. Afrin provided as well as home care instructions. Patient is afebrile and not on chronic anticoagulation. Hgb stable. No hx of trauma. Will continue with further supportive outpatient care. Return precautions discussed and provided. Patient discharged in stable condition with no unaddressed concerns.   Final Clinical Impressions(s) / ED Diagnoses   Final diagnoses:  Left-sided epistaxis    New Prescriptions New Prescriptions   No medications on file     Antonietta Breach, Hershal Coria 12/23/16 2202    Gareth Morgan, MD 12/24/16 1331

## 2016-12-23 NOTE — ED Triage Notes (Signed)
Pt c/o nosebleed x 3-4 times yesterday and today-- bleeding stopped on arrival to ed,

## 2016-12-23 NOTE — ED Notes (Signed)
Discharge instructions and use of Afrin reviewed - voiced understanding

## 2016-12-23 NOTE — Discharge Instructions (Signed)
Avoid nose picking or forceful blowing of your nose. Use Afrin, 2 sprays in each nostril, once per day for an additional 2 days. Do not use longer than 3 consecutive days. You may return for new or concerning symptoms, such as a nosebleed that will not stop with continued pressure.

## 2017-06-28 ENCOUNTER — Emergency Department (HOSPITAL_COMMUNITY): Payer: Medicare Other

## 2017-06-28 ENCOUNTER — Encounter (HOSPITAL_COMMUNITY): Payer: Self-pay | Admitting: *Deleted

## 2017-06-28 ENCOUNTER — Inpatient Hospital Stay (HOSPITAL_COMMUNITY)
Admission: EM | Admit: 2017-06-28 | Discharge: 2017-07-11 | DRG: 853 | Disposition: A | Discharge status: Skilled Nursing Facility | Payer: Medicare Other | Attending: Family Medicine | Admitting: Family Medicine

## 2017-06-28 DIAGNOSIS — Z716 Tobacco abuse counseling: Secondary | ICD-10-CM

## 2017-06-28 DIAGNOSIS — Z4659 Encounter for fitting and adjustment of other gastrointestinal appliance and device: Secondary | ICD-10-CM

## 2017-06-28 DIAGNOSIS — G9340 Encephalopathy, unspecified: Secondary | ICD-10-CM | POA: Diagnosis not present

## 2017-06-28 DIAGNOSIS — R6521 Severe sepsis with septic shock: Secondary | ICD-10-CM | POA: Diagnosis not present

## 2017-06-28 DIAGNOSIS — G9341 Metabolic encephalopathy: Secondary | ICD-10-CM | POA: Diagnosis not present

## 2017-06-28 DIAGNOSIS — L97509 Non-pressure chronic ulcer of other part of unspecified foot with unspecified severity: Secondary | ICD-10-CM | POA: Diagnosis not present

## 2017-06-28 DIAGNOSIS — E11621 Type 2 diabetes mellitus with foot ulcer: Secondary | ICD-10-CM

## 2017-06-28 DIAGNOSIS — E785 Hyperlipidemia, unspecified: Secondary | ICD-10-CM

## 2017-06-28 DIAGNOSIS — R748 Abnormal levels of other serum enzymes: Secondary | ICD-10-CM | POA: Diagnosis not present

## 2017-06-28 DIAGNOSIS — M87074 Idiopathic aseptic necrosis of right foot: Secondary | ICD-10-CM | POA: Diagnosis not present

## 2017-06-28 DIAGNOSIS — R410 Disorientation, unspecified: Secondary | ICD-10-CM | POA: Diagnosis not present

## 2017-06-28 DIAGNOSIS — I5042 Chronic combined systolic (congestive) and diastolic (congestive) heart failure: Secondary | ICD-10-CM | POA: Diagnosis present

## 2017-06-28 DIAGNOSIS — F17200 Nicotine dependence, unspecified, uncomplicated: Secondary | ICD-10-CM

## 2017-06-28 DIAGNOSIS — I110 Hypertensive heart disease with heart failure: Secondary | ICD-10-CM | POA: Diagnosis not present

## 2017-06-28 DIAGNOSIS — I10 Essential (primary) hypertension: Secondary | ICD-10-CM | POA: Diagnosis not present

## 2017-06-28 DIAGNOSIS — R296 Repeated falls: Secondary | ICD-10-CM | POA: Diagnosis not present

## 2017-06-28 DIAGNOSIS — E1152 Type 2 diabetes mellitus with diabetic peripheral angiopathy with gangrene: Secondary | ICD-10-CM | POA: Diagnosis not present

## 2017-06-28 DIAGNOSIS — L97519 Non-pressure chronic ulcer of other part of right foot with unspecified severity: Secondary | ICD-10-CM | POA: Diagnosis not present

## 2017-06-28 DIAGNOSIS — L8990 Pressure ulcer of unspecified site, unspecified stage: Secondary | ICD-10-CM

## 2017-06-28 DIAGNOSIS — E872 Acidosis: Secondary | ICD-10-CM | POA: Diagnosis present

## 2017-06-28 DIAGNOSIS — E1165 Type 2 diabetes mellitus with hyperglycemia: Secondary | ICD-10-CM | POA: Diagnosis not present

## 2017-06-28 DIAGNOSIS — L89890 Pressure ulcer of other site, unstageable: Secondary | ICD-10-CM | POA: Diagnosis not present

## 2017-06-28 DIAGNOSIS — I2510 Atherosclerotic heart disease of native coronary artery without angina pectoris: Secondary | ICD-10-CM | POA: Diagnosis not present

## 2017-06-28 DIAGNOSIS — Z794 Long term (current) use of insulin: Secondary | ICD-10-CM

## 2017-06-28 DIAGNOSIS — E1142 Type 2 diabetes mellitus with diabetic polyneuropathy: Secondary | ICD-10-CM | POA: Diagnosis not present

## 2017-06-28 DIAGNOSIS — E876 Hypokalemia: Secondary | ICD-10-CM | POA: Diagnosis present

## 2017-06-28 DIAGNOSIS — L02611 Cutaneous abscess of right foot: Secondary | ICD-10-CM | POA: Diagnosis present

## 2017-06-28 DIAGNOSIS — Z9114 Patient's other noncompliance with medication regimen: Secondary | ICD-10-CM

## 2017-06-28 DIAGNOSIS — L0390 Cellulitis, unspecified: Secondary | ICD-10-CM | POA: Diagnosis not present

## 2017-06-28 DIAGNOSIS — R778 Other specified abnormalities of plasma proteins: Secondary | ICD-10-CM | POA: Diagnosis present

## 2017-06-28 DIAGNOSIS — I340 Nonrheumatic mitral (valve) insufficiency: Secondary | ICD-10-CM | POA: Diagnosis not present

## 2017-06-28 DIAGNOSIS — A408 Other streptococcal sepsis: Principal | ICD-10-CM | POA: Diagnosis present

## 2017-06-28 DIAGNOSIS — Z7984 Long term (current) use of oral hypoglycemic drugs: Secondary | ICD-10-CM

## 2017-06-28 DIAGNOSIS — E11628 Type 2 diabetes mellitus with other skin complications: Secondary | ICD-10-CM | POA: Diagnosis present

## 2017-06-28 DIAGNOSIS — A419 Sepsis, unspecified organism: Secondary | ICD-10-CM | POA: Diagnosis not present

## 2017-06-28 DIAGNOSIS — Z833 Family history of diabetes mellitus: Secondary | ICD-10-CM

## 2017-06-28 DIAGNOSIS — E1169 Type 2 diabetes mellitus with other specified complication: Secondary | ICD-10-CM | POA: Diagnosis present

## 2017-06-28 DIAGNOSIS — E119 Type 2 diabetes mellitus without complications: Secondary | ICD-10-CM

## 2017-06-28 DIAGNOSIS — R7989 Other specified abnormal findings of blood chemistry: Secondary | ICD-10-CM | POA: Diagnosis present

## 2017-06-28 DIAGNOSIS — Z8249 Family history of ischemic heart disease and other diseases of the circulatory system: Secondary | ICD-10-CM

## 2017-06-28 DIAGNOSIS — L089 Local infection of the skin and subcutaneous tissue, unspecified: Secondary | ICD-10-CM | POA: Diagnosis not present

## 2017-06-28 DIAGNOSIS — G92 Toxic encephalopathy: Secondary | ICD-10-CM | POA: Diagnosis not present

## 2017-06-28 DIAGNOSIS — L97529 Non-pressure chronic ulcer of other part of left foot with unspecified severity: Secondary | ICD-10-CM | POA: Diagnosis present

## 2017-06-28 DIAGNOSIS — E118 Type 2 diabetes mellitus with unspecified complications: Secondary | ICD-10-CM

## 2017-06-28 DIAGNOSIS — E43 Unspecified severe protein-calorie malnutrition: Secondary | ICD-10-CM | POA: Diagnosis not present

## 2017-06-28 DIAGNOSIS — F17210 Nicotine dependence, cigarettes, uncomplicated: Secondary | ICD-10-CM | POA: Diagnosis present

## 2017-06-28 DIAGNOSIS — J9690 Respiratory failure, unspecified, unspecified whether with hypoxia or hypercapnia: Secondary | ICD-10-CM

## 2017-06-28 DIAGNOSIS — I468 Cardiac arrest due to other underlying condition: Secondary | ICD-10-CM | POA: Diagnosis not present

## 2017-06-28 DIAGNOSIS — J9601 Acute respiratory failure with hypoxia: Secondary | ICD-10-CM | POA: Diagnosis not present

## 2017-06-28 DIAGNOSIS — Z7982 Long term (current) use of aspirin: Secondary | ICD-10-CM

## 2017-06-28 DIAGNOSIS — R451 Restlessness and agitation: Secondary | ICD-10-CM | POA: Diagnosis not present

## 2017-06-28 DIAGNOSIS — I472 Ventricular tachycardia: Secondary | ICD-10-CM | POA: Diagnosis not present

## 2017-06-28 LAB — CBC WITH DIFFERENTIAL/PLATELET
BASOS PCT: 0 %
Basophils Absolute: 0 10*3/uL (ref 0.0–0.1)
EOS PCT: 0 %
Eosinophils Absolute: 0 10*3/uL (ref 0.0–0.7)
HEMATOCRIT: 38.8 % — AB (ref 39.0–52.0)
HEMOGLOBIN: 13.3 g/dL (ref 13.0–17.0)
LYMPHS PCT: 7 %
Lymphs Abs: 1.3 10*3/uL (ref 0.7–4.0)
MCH: 29.4 pg (ref 26.0–34.0)
MCHC: 34.3 g/dL (ref 30.0–36.0)
MCV: 85.8 fL (ref 78.0–100.0)
MONO ABS: 1.2 10*3/uL — AB (ref 0.1–1.0)
MONOS PCT: 6 %
NEUTROS PCT: 87 %
Neutro Abs: 16.7 10*3/uL — ABNORMAL HIGH (ref 1.7–7.7)
Platelets: 247 10*3/uL (ref 150–400)
RBC: 4.52 MIL/uL (ref 4.22–5.81)
RDW: 14 % (ref 11.5–15.5)
WBC: 19.2 10*3/uL — ABNORMAL HIGH (ref 4.0–10.5)

## 2017-06-28 LAB — URINALYSIS, ROUTINE W REFLEX MICROSCOPIC
BILIRUBIN URINE: NEGATIVE
Bacteria, UA: NONE SEEN
Glucose, UA: >=500 mg/dL — AB
KETONES UR: 20 mg/dL — AB
Leukocytes, UA: NEGATIVE
Nitrite: NEGATIVE
Protein, ur: 30 mg/dL — AB
SQUAMOUS EPITHELIAL / LPF: NONE SEEN
Specific Gravity, Urine: 1.021 (ref 1.005–1.030)
pH: 5 (ref 5.0–8.0)

## 2017-06-28 LAB — COMPREHENSIVE METABOLIC PANEL
ALT: 41 U/L (ref 17–63)
AST: 46 U/L — ABNORMAL HIGH (ref 15–41)
Albumin: 2.5 g/dL — ABNORMAL LOW (ref 3.5–5.0)
Alkaline Phosphatase: 86 U/L (ref 38–126)
Anion gap: 20 — ABNORMAL HIGH (ref 5–15)
BUN: 21 mg/dL — ABNORMAL HIGH (ref 6–20)
CHLORIDE: 93 mmol/L — AB (ref 101–111)
CO2: 21 mmol/L — AB (ref 22–32)
CREATININE: 0.93 mg/dL (ref 0.61–1.24)
Calcium: 8.2 mg/dL — ABNORMAL LOW (ref 8.9–10.3)
GFR calc non Af Amer: >60 mL/min (ref >60)
Glucose, Bld: 277 mg/dL — ABNORMAL HIGH (ref 65–99)
POTASSIUM: 4.1 mmol/L (ref 3.5–5.1)
SODIUM: 134 mmol/L — AB (ref 135–145)
Total Bilirubin: 2.1 mg/dL — ABNORMAL HIGH (ref 0.3–1.2)
Total Protein: 6.6 g/dL (ref 6.5–8.1)

## 2017-06-28 LAB — I-STAT CG4 LACTIC ACID, ED
LACTIC ACID, VENOUS: 2.34 mmol/L — AB (ref 0.5–1.9)
LACTIC ACID, VENOUS: 1.69 mmol/L (ref 0.5–1.9)

## 2017-06-28 LAB — BRAIN NATRIURETIC PEPTIDE: B Natriuretic Peptide: 710.8 pg/mL — ABNORMAL HIGH (ref 0.0–100.0)

## 2017-06-28 LAB — TROPONIN I: TROPONIN I: 0.04 ng/mL — AB (ref <0.03)

## 2017-06-28 LAB — CBG MONITORING, ED: Glucose-Capillary: 250 mg/dL — ABNORMAL HIGH (ref 65–99)

## 2017-06-28 MED ORDER — NICOTINE 14 MG/24HR TD PT24
14.0000 mg | MEDICATED_PATCH | Freq: Every day | TRANSDERMAL | Status: DC
  Administered 2017-06-29 – 2017-07-11 (×13): 14 mg via TRANSDERMAL
  Filled 2017-06-28 (×13): qty 1

## 2017-06-28 MED ORDER — PIPERACILLIN-TAZOBACTAM 3.375 G IVPB 30 MIN
3.3750 g | Freq: Once | INTRAVENOUS | Status: AC
  Administered 2017-06-28: 3.375 g via INTRAVENOUS
  Filled 2017-06-28: qty 50

## 2017-06-28 MED ORDER — SODIUM CHLORIDE 0.9 % IV BOLUS
1000.0000 mL | Freq: Once | INTRAVENOUS | Status: AC
  Administered 2017-06-28: 1000 mL via INTRAVENOUS

## 2017-06-28 MED ORDER — PIPERACILLIN-TAZOBACTAM 3.375 G IVPB
3.3750 g | Freq: Three times a day (TID) | INTRAVENOUS | Status: DC
  Administered 2017-06-29 – 2017-06-30 (×5): 3.375 g via INTRAVENOUS
  Filled 2017-06-28 (×6): qty 50

## 2017-06-28 MED ORDER — VANCOMYCIN HCL IN DEXTROSE 1-5 GM/200ML-% IV SOLN
1000.0000 mg | Freq: Once | INTRAVENOUS | Status: AC
  Administered 2017-06-28: 1000 mg via INTRAVENOUS
  Filled 2017-06-28: qty 200

## 2017-06-28 NOTE — ED Notes (Signed)
Provided pt with water and sandwich

## 2017-06-28 NOTE — ED Notes (Signed)
Attempted to call report. RN to call back. 

## 2017-06-28 NOTE — Progress Notes (Addendum)
Pharmacy Antibiotic Note  Miguel Harvey is a 72 y.o. male admitted on 06/28/2017 with bilateral LE celluliits, probable osteomyelitis.  Pharmacy has been consulted for Vancomycin and Zosyn dosing.  Plan: Zosyn 3.375g IV q8h (4 hour infusion).  Vancomycin 1 gm IV q12h for est AUC =548 Goal AUC = 400-500 Daily scr F/u cultures/levels    Temp (24hrs), Avg:97.8 F (36.6 C), Min:97.8 F (36.6 C), Max:97.8 F (36.6 C)  Recent Labs  Lab 06/28/17 1816 06/28/17 1833 06/28/17 2106  WBC  --  19.2*  --   CREATININE  --  0.93  --   LATICACIDVEN 2.34*  --  1.69    CrCl cannot be calculated (Unknown ideal weight.).    No Known Allergies  Antimicrobials this admission: 4/1 Zosyn >>  4/1 Vancomycin  >>   Dose adjustments this admission:  Microbiology results: 4/1 BCx: sent  Thank you for allowing pharmacy to be a part of this patient's care.  Otho Bellows 06/28/2017 9:15 PM

## 2017-06-28 NOTE — ED Notes (Signed)
Elevated lactic reported to MD Madilyn [facility]

## 2017-06-28 NOTE — ED Provider Notes (Signed)
[place] [facility] PROGRESSIVE CARE Provider Note   CSN: [id number] Arrival date & time: 06/28/17  1750     History   Chief Complaint Chief Complaint  Patient presents with  . Weakness    HPI Miguel Harvey is a 72 y.o. male.  The history is provided by the patient. No language interpreter was used.  Weakness     Miguel Harvey is a 72 y.o. male who presents to the Emergency Department complaining of weakness. He presents for evaluation of weakness and falls that have been ongoing for the last three days. Two weeks ago he had an upper respiratory tract infection with associated cough. He took over-the-counter Mucinex and the symptoms improved. Three days ago he suddenly could no longer ambulate and has been able to crawl on the floor to get around the house. He feels strong his upper extremities but feels weak in his lower extremities. He has chronic difficulty with sensation and bilateral lower extremities. He has a history of diabetes but has not been on any medications for diabetes in the last several years. He does smoke cigarettes, drinks occasional alcohol, no drug use. He does not have a family doctor and does not go to see the doctor.  Past Medical History:  Diagnosis Date  . CHF (congestive heart failure) (HCC)   . Diabetes mellitus   . Hypertension     Patient Active Problem List   Diagnosis Date Noted  . Sepsis (HCC) 06/28/2017  . Diabetic foot infection (HCC) 06/28/2017  . Elevated troponin 06/28/2017  . Pulmonary nodule 07/01/2011  . Acute diastolic heart failure, 2D EF 38-88% with grade 2 diastloic dysfunction 05/21/11  . Family history of early CAD, brother died 38 CAD 21-May-2011  . Dyslipidemia 21-May-2011  . NICM, minor CAD at cath, EF 25% at cath, 55% by 2D 21-May-2011  . Diabetes mellitus (HCC) 05/07/2011  . HTN (hypertension), malignant 05/07/2011  . TOBACCO DEPENDENCE 05/27/2006    Past Surgical History:  Procedure Laterality Date  . cyst removal from   right leg about 6 years ago    . LEFT HEART CATHETERIZATION WITH CORONARY ANGIOGRAM N/A 05/11/2011   Procedure: LEFT HEART CATHETERIZATION WITH CORONARY ANGIOGRAM;  Surgeon: Lennette Bihari, MD;  Location: [facility] CATH LAB;  Service: Cardiovascular;  Laterality: N/A;        Home Medications    Prior to Admission medications   Medication Sig Start Date End Date Taking? Authorizing Provider  aspirin 325 MG tablet Take 325 mg by mouth daily.   Yes [provider]  atorvastatin (LIPITOR) 40 MG tablet take 1 tablet by mouth once daily Patient not taking: Reported on 12/23/2016 12/30/15   Beaulah Dinning, MD  carvedilol (COREG) 6.25 MG tablet take 1 tablet by mouth twice a day with meals Patient not taking: Reported on 12/23/2016 05/01/14   Briscoe Deutscher, DO  enalapril-hydrochlorothiazide (VASERETIC) 10-25 MG tablet take 1 tablet by mouth once daily Patient not taking: Reported on 12/23/2016 01/01/16   Beaulah Dinning, MD  metFORMIN (GLUCOPHAGE) 1000 MG tablet Take 0.5 tablets (500 mg total) by mouth 2 (two) times daily with a meal. Patient not taking: Reported on 12/23/2016 09/02/12   Brent Bulla, MD    Family History Family History  Problem Relation Age of Onset  . CAD Other   . Diabetes Other     Social History Social History   Tobacco Use  . Smoking status: Current Every Day Smoker    Packs/day:  2.00    Years: 40.00    Pack years: 80.00    Types: Cigarettes  . Smokeless tobacco: Former Neurosurgeon    Quit date: 05/07/2011  Substance Use Topics  . Alcohol use: Yes    Alcohol/week: 3.6 oz    Types: 6 Cans of beer per week  . Drug use: Not on file     Allergies   Patient has no known allergies.   Review of Systems Review of Systems  Neurological: Positive for weakness.  All other systems reviewed and are negative.    Physical Exam Updated Vital Signs BP (!) 156/87 (BP Location: Right Arm)   Pulse 94   Temp 98.7 F (37.1 C) (Axillary)   Resp 19   Ht 5\' 9"   (1.753 m)   Wt 94.7 kg (208 lb 12.4 oz)   SpO2 100%   BMI 30.83 kg/m   Physical Exam  Constitutional: He is oriented to person, place, and time. He appears well-developed and well-nourished.  HENT:  Head: Normocephalic and atraumatic.  Cardiovascular: Normal rate and regular rhythm.  No murmur heard. Pulmonary/Chest: Effort normal and breath sounds normal. No respiratory distress.  Abdominal: Soft. There is no tenderness. There is no rebound and no guarding.  Musculoskeletal: He exhibits no edema or tenderness.  2+ femoral pulses bilaterally. Faint DP pulses bilaterally. Right foot with sloughing and necrotic changes to the dorsal aspect at the base of the first, second, third toes. There is a deep on the plantar aspect of the right foot. Multiple calluses on the base of the left foot. Edema to bilateral lower extremities and feet.  Neurological: He is alert and oriented to person, place, and time.  Five out of five strength and bilateral upper extremities. 4+ out of five strength in bilateral lower extremities. There is altered sensational light touch throughout bilateral lower extremities.  Skin: Skin is warm and dry.  Psychiatric: He has a normal mood and affect. His behavior is normal.  Nursing note and vitals reviewed.    ED Treatments / Results  Labs (all labs ordered are listed, but only abnormal results are displayed) Labs Reviewed  COMPREHENSIVE METABOLIC PANEL - Abnormal; Notable for the following components:      Result Value   Sodium 134 (*)    Chloride 93 (*)    CO2 21 (*)    Glucose, Bld 277 (*)    BUN 21 (*)    Calcium 8.2 (*)    Albumin 2.5 (*)    AST 46 (*)    Total Bilirubin 2.1 (*)    Anion gap 20 (*)    All other components within normal limits  CBC WITH DIFFERENTIAL/PLATELET - Abnormal; Notable for the following components:   WBC 19.2 (*)    HCT 38.8 (*)    Neutro Abs 16.7 (*)    Monocytes Absolute 1.2 (*)    All other components within normal limits   URINALYSIS, ROUTINE W REFLEX MICROSCOPIC - Abnormal; Notable for the following components:   Color, Urine AMBER (*)    Glucose, UA >=500 (*)    Hgb urine dipstick SMALL (*)    Ketones, ur 20 (*)    Protein, ur 30 (*)    All other components within normal limits  TROPONIN I - Abnormal; Notable for the following components:   Troponin I 0.04 (*)    All other components within normal limits  BRAIN NATRIURETIC PEPTIDE - Abnormal; Notable for the following components:   B Natriuretic Peptide 710.8 (*)  All other components within normal limits  TROPONIN I - Abnormal; Notable for the following components:   Troponin I 0.05 (*)    All other components within normal limits  HEMOGLOBIN A1C - Abnormal; Notable for the following components:   Hgb A1c MFr Bld 9.2 (*)    All other components within normal limits  GLUCOSE, CAPILLARY - Abnormal; Notable for the following components:   Glucose-Capillary 287 (*)    All other components within normal limits  CBG MONITORING, ED - Abnormal; Notable for the following components:   Glucose-Capillary 250 (*)    All other components within normal limits  I-STAT CG4 LACTIC ACID, ED - Abnormal; Notable for the following components:   Lactic Acid, Venous 2.34 (*)    All other components within normal limits  CULTURE, BLOOD (ROUTINE X 2)  CULTURE, BLOOD (ROUTINE X 2)  MRSA PCR SCREENING  TROPONIN I  TROPONIN I  LACTIC ACID, PLASMA  LACTIC ACID, PLASMA  PROCALCITONIN  PROTIME-INR  APTT  HIV ANTIBODY (ROUTINE TESTING)  SEDIMENTATION RATE  C-REACTIVE PROTEIN  PREALBUMIN  MAGNESIUM  PHOSPHORUS  TSH  COMPREHENSIVE METABOLIC PANEL  CBC  I-STAT CG4 LACTIC ACID, ED  I-STAT CG4 LACTIC ACID, ED    EKG EKG Interpretation  Date/Time:  Monday June 28 2017 18:15:30 EDT Ventricular Rate:  110 PR Interval:    QRS Duration: 94 QT Interval:  364 QTC Calculation: 470 R Axis:   -25 Text Interpretation:  Atrial fibrillation Ventricular tachycardia,  unsustained Borderline left axis deviation Borderline T wave abnormalities Artifact in lead(s) I III aVR aVL aVF V1 Confirmed by Tilden Fossa [phone]) on 06/28/2017 7:13:55 PM   Radiology Dg Chest 2 View  Result Date: 06/28/2017 CLINICAL DATA:  History of smoking and congestive heart failure. EXAM: CHEST - 2 VIEW COMPARISON:  05/08/2011; chest CT - 05/17/2012 FINDINGS: Grossly unchanged cardiac silhouette and mediastinal contours. Minimal bilateral infrahilar opacities are unchanged and favored to represent atelectasis. No new focal airspace opacities. No pleural effusion or pneumothorax. No evidence of edema. No acute osseous abnormalities. Stigmata of DISH within the thoracic spine. IMPRESSION: No acute cardiopulmonary disease. Electronically Signed   By: Simonne Come M.D.   On: 06/28/2017 19:30   Dg Foot Complete Left  Result Date: 06/28/2017 CLINICAL DATA:  Necrosis involving the bilateral feet EXAM: LEFT FOOT - COMPLETE 3+ VIEW COMPARISON:  None. FINDINGS: There is absence of near the entirety of the fifth digit with only minimal amount of the base of the proximal phalanx remaining. No fracture or dislocation. Mild degenerative change involving the first MTP joint. No significant hallux valgus deformity. No erosions. Small plantar calcaneal spur. Enthesopathic change involving the Achilles tendon insertion site. 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 Further evaluation with MRI could performed as indicated. Electronically Signed   By: Simonne Come M.D.   On: 06/28/2017 19:35   Dg Foot Complete Right  Result Date: 06/28/2017 CLINICAL DATA:  Necrosis of the bilateral feet. EXAM: RIGHT FOOT COMPLETE - 3+ VIEW COMPARISON:  None. FINDINGS: No fracture or dislocation. There is an apparent ulcer involving weight-bearing surface about the head of the first metatarsal. Minimal amount of adjacent subcutaneous emphysema. No definite radiopaque foreign body. No discrete areas  of osteolysis to suggest osteomyelitis. 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 Note is made of an os tibialis externum. Small plantar calcaneal spur. Minimal enthesopathic change involving the Achilles tendon insertion site. IMPRESSION: Suspected ulcer about the weight-bearing  surface about the head of the first metatarsal with adjacent subcutaneous emphysema but without radiographic evidence of osteomyelitis. Further evaluation with MRI could be performed as clinically Electronically Signed   By: Simonne Come M.D.   On: 06/28/2017 19:33    Procedures Procedures (including critical care time)  Medications Ordered in ED Medications  insulin aspart (novoLOG) injection 0-9 Units (has no administration in time range)  carvedilol (COREG) tablet 6.25 mg (has no administration in time range)  atorvastatin (LIPITOR) tablet 40 mg (has no administration in time range)  acetaminophen (TYLENOL) tablet 650 mg (has no administration in time range)    Or  acetaminophen (TYLENOL) suppository 650 mg (has no administration in time range)  HYDROcodone-acetaminophen (NORCO/VICODIN) 5-325 MG per tablet 1-2 tablet (has no administration in time range)  ondansetron (ZOFRAN) tablet 4 mg (has no administration in time range)    Or  ondansetron (ZOFRAN) injection 4 mg (has no administration in time range)  nicotine (NICODERM CQ - dosed in mg/24 hours) patch 14 mg (has no administration in time range)  piperacillin-tazobactam (ZOSYN) IVPB 3.375 g (has no administration in time range)  vancomycin (VANCOCIN) IVPB 1000 mg/200 mL premix (has no administration in time range)  sodium chloride 0.9 % bolus 1,000 mL (0 mLs Intravenous Stopped 06/28/17 1936)  piperacillin-tazobactam (ZOSYN) IVPB 3.375 g (0 g Intravenous Stopped 06/28/17 1910)  vancomycin (VANCOCIN) IVPB 1000 mg/200 mL premix (0 mg Intravenous Stopped 06/28/17 2324)     Initial Impression / Assessment and Plan / ED Course  I have  reviewed the triage vital signs and the nursing notes.  Pertinent labs & imaging results that were available during my care of the patient were reviewed by me and considered in my medical decision making (see chart for details).     Patient here for evaluation of progressive weakness, unable to ambulate for the last several days. He has severe wounds to the right foot concerning for gangrenous infection. He was treated with antibiotics, IV fluids. Patient updated findings of studies and recommendation for admission for further treatment with antibiotics, possible amputation. Hospitalist consulted for admission.  Final Clinical Impressions(s) / ED Diagnoses   Final diagnoses:  None    ED Discharge Orders    None       Tilden Fossa, MD 06/29/17 [phone]

## 2017-06-28 NOTE — ED Notes (Signed)
Date and time results received: 06/28/17 10:04 PM (use smartphrase ".now" to insert current time)  Test: Troponin Critical Value: 0.04  Name of Provider Notified: [place]  Orders Received? Or Actions Taken?: awaiting orders

## 2017-06-28 NOTE — ED Notes (Signed)
ED Provider at bedside. 

## 2017-06-28 NOTE — ED Triage Notes (Signed)
Transported by [facility] from home alone--generalized weakness x 2 days, A & O x 4. Family reports that patient has not been taking his medications. + nausea/vomiting, polydipsia, polyuria x 2-3 days. CBG reading of 326 mg/dl.

## 2017-06-28 NOTE — H&P (Signed)
ROBBERT LANGLINAIS WUJ:[id number] DOB: [birth year] DOA: 06/28/2017     PCP: Beaulah Dinning, MD   Outpatient Specialists:   NONE     Patient arrived to ER on 06/28/17 at 1750  Patient coming from:    home Lives alone,      Chief Complaint:  Chief Complaint  Patient presents with  . Weakness    HPI: Miguel Harvey is a 72 y.o. male with medical history significant of combined systolic and diastolic CHF, diabetes, hypertension dyslipidemia   Presented with   inability to walk and generalized weakness for past 2 days reported to ER staff that he was dragging himself around on all fours around the house has not been taking his medications had significant nausea and vomiting.  He has known history of diabetes but have not been followed up with primary care provider Has had ongoing recurrent falls recently.  Reports 2 weeks ago he did have a URI which was associated mild cough seems to have now improved with 3 days ago became so weak he could not ambulate reported difficult to sensation bilateral lower extremities.  Patient continues to smoke. Denies any fevers. No significant pain sensation in lower feet.  Regarding pertinent Chronic problems: Last echogram done in 2014 showed EF 40-45% with global hypokinesis History of diabetes but not controlled While in ER:  Was found to have what appeared to be gangrene necrosis and sloughing off of his first second and third toes on the right with bilateral lower extremity edema. 2+ femoral pulses were noted Blood sugar elevated initially in the 300s now down to 250   Significant initial  Findings: Abnormal Labs Reviewed  COMPREHENSIVE METABOLIC PANEL - Abnormal; Notable for the following components:      Result Value   Sodium 134 (*)    Chloride 93 (*)    CO2 21 (*)    Glucose, Bld 277 (*)    BUN 21 (*)    Calcium 8.2 (*)    Albumin 2.5 (*)    AST 46 (*)    Total Bilirubin 2.1 (*)    Anion gap 20 (*)    All other components within  normal limits  CBC WITH DIFFERENTIAL/PLATELET - Abnormal; Notable for the following components:   WBC 19.2 (*)    HCT 38.8 (*)    Neutro Abs 16.7 (*)    Monocytes Absolute 1.2 (*)    All other components within normal limits  CBG MONITORING, ED - Abnormal; Notable for the following components:   Glucose-Capillary 250 (*)    All other components within normal limits  I-STAT CG4 LACTIC ACID, ED - Abnormal; Notable for the following components:   Lactic Acid, Venous 2.34 (*)    All other components within normal limits     Cr   stable,    Lab Results  Component Value Date   CREATININE 0.93 06/28/2017   CREATININE 1.43 (H) 09/21/2012   CREATININE 1.48 (H) 09/09/2012      HG/HCT stable,     Component Value Date/Time   HGB 13.3 06/28/2017 1833   HCT 38.8 (L) 06/28/2017 1833        BNP (last 3 results) No results for input(s): BNP in the last 8760 hours.  ProBNP (last 3 results) No results for input(s): PROBNP in the last 8760 hours.  Lactic Acid, Venous    Component Value Date/Time   LATICACIDVEN 2.34 (HH) 06/28/2017 1816      UA ordered  ECG:  Personally reviewed by me showing: HR : 110 Rhythm: sinus tachycardia ECG read out as A.fib but p waves noted Ischemic changes  no evidence of ischemic changes QTC 470     ED Triage Vitals [06/28/17 1807]  Enc Vitals Group     BP (!) 132/100     Pulse Rate (!) 102     Resp 19     Temp 97.8 F (36.6 C)     Temp Source Oral     SpO2 98 %     Weight      Height      Head Circumference      Peak Flow      Pain Score      Pain Loc      Pain Edu?      Excl. in GC?   FBPP(94)@     on arrival  ED Triage Vitals [06/28/17 1807]  Enc Vitals Group     BP (!) 132/100     Pulse Rate (!) 102     Resp 19     Temp 97.8 F (36.6 C)     Temp Source Oral     SpO2 98 %     Weight      Height      Head Circumference      Peak Flow      Pain Score      Pain Loc      Pain Edu?      Excl. in GC?      Latest   Blood pressure (!) 142/86, pulse 95, temperature 97.8 F (36.6 C), temperature source Oral, resp. rate 15, SpO2 99 %.   Following Medications were ordered in ER: Medications  sodium chloride 0.9 % bolus 1,000 mL (1,000 mLs Intravenous New Bag/Given 06/28/17 1835)  piperacillin-tazobactam (ZOSYN) IVPB 3.375 g (0 g Intravenous Stopped 06/28/17 1910)    ER Provider Called:     Dr. Charlann Boxer They Recommend instead to [facility] Dr. Lajoyce Corners will see in a.m.   Hospitalist was called for admission for diabetic foot ulcer with possible gangrene   Review of Systems:    Pertinent positives include: Foot pain nausea  Constitutional:  No weight loss, night sweats, Fevers, chills, fatigue, weight loss  HEENT:  No headaches, Difficulty swallowing,Tooth/dental problems,Sore throat,  No sneezing, itching, ear ache, nasal congestion, post nasal drip,  Cardio-vascular:  No chest pain, Orthopnea, PND, anasarca, dizziness, palpitations.no Bilateral lower extremity swelling  GI:  No heartburn, indigestion, abdominal pain, , diarrhea, change in bowel habits, loss of appetite, melena, blood in stool, hematemesis Resp:  no shortness of breath at rest. No dyspnea on exertion, No excess mucus, no productive cough, No non-productive cough, No coughing up of blood.No change in color of mucus.No wheezing. Skin:  no rash or lesions. No jaundice GU:  no dysuria, change in color of urine, no urgency or frequency. No straining to urinate.  No flank pain.  Musculoskeletal:  No joint pain or no joint swelling. No decreased range of motion. No back pain.  Psych:  No change in mood or affect. No depression or anxiety. No memory loss.  Neuro: no localizing neurological complaints, no tingling, no weakness, no double vision, no gait abnormality, no slurred speech, no confusion  As per HPI otherwise 10 point review of systems negative.   Past Medical History:   Past Medical History:  Diagnosis Date  . CHF (congestive  heart failure) (HCC)   . Diabetes mellitus   .  Hypertension       Past Surgical History:  Procedure Laterality Date  . cyst removal from  right leg about 6 years ago    . LEFT HEART CATHETERIZATION WITH CORONARY ANGIOGRAM N/A 05/11/2011   Procedure: LEFT HEART CATHETERIZATION WITH CORONARY ANGIOGRAM;  Surgeon: Lennette Bihari, MD;  Location[facility] CATH LAB;  Service: Cardiovascular;  Laterality: N/A;    Social History:  Ambulatory   independently at baseline   reports that he has been smoking cigarettes.  He has a 80.00 pack-year smoking history. He quit smokeless tobacco use about 6 years ago. He reports that he drinks about 3.6 oz of alcohol per week. His drug history is not on file.     Family History:   Family History  Problem Relation Age of Onset  . CAD Other   . Diabetes Other     Allergies: No Known Allergies   Prior to Admission medications   Medication Sig Start Date End Date Taking? Authorizing Provider  aspirin 325 MG tablet Take 325 mg by mouth daily.   Yes [provider]  atorvastatin (LIPITOR) 40 MG tablet take 1 tablet by mouth once daily Patient not taking: Reported on 12/23/2016 12/30/15   Beaulah Dinning, MD  carvedilol (COREG) 6.25 MG tablet take 1 tablet by mouth twice a day with meals Patient not taking: Reported on 12/23/2016 05/01/14   Briscoe Deutscher, DO  enalapril-hydrochlorothiazide (VASERETIC) 10-25 MG tablet take 1 tablet by mouth once daily Patient not taking: Reported on 12/23/2016 01/01/16   Beaulah Dinning, MD  metFORMIN (GLUCOPHAGE) 1000 MG tablet Take 0.5 tablets (500 mg total) by mouth 2 (two) times daily with a meal. Patient not taking: Reported on 12/23/2016 09/02/12   Brent Bulla, MD   Physical Exam: Blood pressure (!) 142/86, pulse 95, temperature 97.8 F (36.6 C), temperature source Oral, resp. rate 15, SpO2 99 %. 1. General:  in No Acute distress   Chronically ill  -appearing 2. Psychological: Alert and  Oriented 3.  Head/ENT:   Dry Mucous Membranes                          Head Non traumatic, neck supple                            Poor Dentition 4. SKIN:  decreased Skin turgor,  Skin clean Dry invasive ulceration of right upper extremity right foot appears to have foul-smelling discharge possible necrosis left foot noted to have possible changes of the first toe consistent with ulcerations 5. Heart: Regular rate and rhythm no  Murmur, no Rub or gallop 6. Lungs:  Clear to auscultation bilaterally, no wheezes or crackles   7. Abdomen: Soft,  non-tender, Non distended  bowel sounds present 8. Lower extremities: no clubbing, cyanosis, or edema 9. Neurologically Grossly intact, moving all 4 extremities equally   10. MSK: Normal range of motion   LABS:     Recent Labs  Lab 06/28/17 1833  WBC 19.2*  NEUTROABS 16.7*  HGB 13.3  HCT 38.8*  MCV 85.8  PLT 247   Basic Metabolic Panel: Recent Labs  Lab 06/28/17 1833  NA 134*  K 4.1  CL 93*  CO2 21*  GLUCOSE 277*  BUN 21*  CREATININE 0.93  CALCIUM 8.2*      Recent Labs  Lab 06/28/17 1833  AST 46*  ALT 41  ALKPHOS 86  BILITOT 2.1*  PROT 6.6  ALBUMIN 2.5*   No results for input(s): LIPASE, AMYLASE in the last 168 hours. No results for input(s): AMMONIA in the last 168 hours.    HbA1C: No results for input(s): HGBA1C in the last 72 hours. CBG: Recent Labs  Lab 06/28/17 1758  GLUCAP 250*      Urine analysis:    Component Value Date/Time   COLORURINE YELLOW 05/07/2011 1813   APPEARANCEUR CLEAR 05/07/2011 1813   LABSPEC 1.020 05/07/2011 1813   PHURINE 5.5 05/07/2011 1813   GLUCOSEU >1000 (A) 05/07/2011 1813   HGBUR TRACE (A) 05/07/2011 1813   BILIRUBINUR NEGATIVE 05/07/2011 1813   KETONESUR NEGATIVE 05/08/2011 0010   PROTEINUR 100 (A) 05/07/2011 1813   UROBILINOGEN 0.2 05/07/2011 1813   NITRITE NEGATIVE 05/07/2011 1813   LEUKOCYTESUR NEGATIVE 05/07/2011 1813     Cultures: No results found for: SDES, SPECREQUEST, CULT,  REPTSTATUS   Radiological Exams on Admission: Dg Chest 2 View  Result Date: 06/28/2017 CLINICAL DATA:  History of smoking and congestive heart failure. EXAM: CHEST - 2 VIEW COMPARISON:  05/08/2011; chest CT - 05/17/2012 FINDINGS: Grossly unchanged cardiac silhouette and mediastinal contours. Minimal bilateral infrahilar opacities are unchanged and favored to represent atelectasis. No new focal airspace opacities. No pleural effusion or pneumothorax. No evidence of edema. No acute osseous abnormalities. Stigmata of DISH within the thoracic spine. IMPRESSION: No acute cardiopulmonary disease. Electronically Signed   By: Simonne Come M.D.   On: 06/28/2017 19:30   Dg Foot Complete Left  Result Date: 06/28/2017 CLINICAL DATA:  Necrosis involving the bilateral feet EXAM: LEFT FOOT - COMPLETE 3+ VIEW COMPARISON:  None. FINDINGS: There is absence of near the entirety of the fifth digit with only minimal amount of the base of the proximal phalanx remaining. No fracture or dislocation. Mild degenerative change involving the first MTP joint. No significant hallux valgus deformity. No erosions. Small plantar calcaneal spur. Enthesopathic change involving the Achilles tendon insertion site. 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 Further evaluation with MRI could performed as indicated. Electronically Signed   By: Simonne Come M.D.   On: 06/28/2017 19:35   Dg Foot Complete Right  Result Date: 06/28/2017 CLINICAL DATA:  Necrosis of the bilateral feet. EXAM: RIGHT FOOT COMPLETE - 3+ VIEW COMPARISON:  None. FINDINGS: No fracture or dislocation. There is an apparent ulcer involving weight-bearing surface about the head of the first metatarsal. Minimal amount of adjacent subcutaneous emphysema. No definite radiopaque foreign body. No discrete areas of osteolysis to suggest osteomyelitis. 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 Note is made of an os tibialis  externum. Small plantar calcaneal spur. Minimal enthesopathic change involving the Achilles tendon insertion site. IMPRESSION: Suspected ulcer about the weight-bearing surface about the head of the first metatarsal with adjacent subcutaneous emphysema but without radiographic evidence of osteomyelitis. Further evaluation with MRI could be performed as clinically Electronically Signed   By: Simonne Come M.D.   On: 06/28/2017 19:33    Chart has been reviewed    Assessment/Plan   72 y.o. male with medical history significant of combined systolic and diastolic CHF, diabetes, hypertension dyslipidemia  Admitted for diabetic foot ulcer of right lower extremity with possible gangrene  Present on Admission: . Diabetic foot infection (HCC) -no evidence of osteomyelitis on plain imaging will order ABI sed rate and CRP will likely benefit from orthopedics and/or vascular consult if there is any evidence of vascular disease will  likely require surgical intervention . HTN (hypertension) - restart Coreg . Dyslipidemia - stable will continue Lipitor . Sepsis (HCC) most likely secondary to foot infection given significant diabetic foot infection will initiate Zosyn/vanc check MRSA and blood cultures History of systolic diastolic CHF -currently appears to be q. close to euvolemic we will monitor fluid status obtain echogram.  Elevated lactic acid in the setting of possible gangrene have somewhat improved   . TOBACCO DEPENDENCE order nicotine patch spoke about importance of quitting nursing to provide tobacco cessation protocol  Abnormal EKG with frequent PVCs monitor on telemetry restart Coreg Given slightly elevated troponin monitor on telemetry continue cycle cardiac enzymes order echo.  DM2 poorly controlled -  not been taking any home medications will order sliding scale and check hemoglobin A1c oral diabetes coordinator consult  Other plan as per orders.  DVT prophylaxis:  SCD    Code Status:  FULL  CODE  as per patient   Family Communication:   Family not   at  Bedside    Disposition Plan:   likely will need placement for rehabilitation                                           Would benefit from PT/OT eval prior to DC please order prior to discharge                        Diabetes coordinator Nutrition    consulted                          Consults called: orthopedics  Admission status:    inpatient       Level of care     tele         I have spent a total of 56 min on this admission   Yamina Lenis 06/28/2017, 10:09 PM    [facility]  Pager [phone]   after 2 AM please page floor coverage PA If 7AM-7PM, please contact the day team taking care of the patient  [email or web address]  Password [email or web address]

## 2017-06-29 ENCOUNTER — Other Ambulatory Visit: Payer: Self-pay

## 2017-06-29 ENCOUNTER — Encounter (HOSPITAL_COMMUNITY): Payer: Medicare Other

## 2017-06-29 DIAGNOSIS — L8990 Pressure ulcer of unspecified site, unspecified stage: Secondary | ICD-10-CM

## 2017-06-29 LAB — COMPREHENSIVE METABOLIC PANEL
ALBUMIN: 2.2 g/dL — AB (ref 3.5–5.0)
ALK PHOS: 80 U/L (ref 38–126)
ALT: 36 U/L (ref 17–63)
AST: 35 U/L (ref 15–41)
Anion gap: 15 (ref 5–15)
BUN: 15 mg/dL (ref 6–20)
CALCIUM: 8.2 mg/dL — AB (ref 8.9–10.3)
CHLORIDE: 96 mmol/L — AB (ref 101–111)
CO2: 23 mmol/L (ref 22–32)
CREATININE: 0.94 mg/dL (ref 0.61–1.24)
GFR calc non Af Amer: >60 mL/min (ref >60)
GLUCOSE: 236 mg/dL — AB (ref 65–99)
Potassium: 3.5 mmol/L (ref 3.5–5.1)
SODIUM: 134 mmol/L — AB (ref 135–145)
Total Bilirubin: 1.5 mg/dL — ABNORMAL HIGH (ref 0.3–1.2)
Total Protein: 6 g/dL — ABNORMAL LOW (ref 6.5–8.1)

## 2017-06-29 LAB — TSH: TSH: 3.023 u[IU]/mL (ref 0.350–4.500)

## 2017-06-29 LAB — CBC
HCT: 38 % — ABNORMAL LOW (ref 39.0–52.0)
HEMOGLOBIN: 12.5 g/dL — AB (ref 13.0–17.0)
MCH: 28 pg (ref 26.0–34.0)
MCHC: 32.9 g/dL (ref 30.0–36.0)
MCV: 85 fL (ref 78.0–100.0)
Platelets: 247 10*3/uL (ref 150–400)
RBC: 4.47 MIL/uL (ref 4.22–5.81)
RDW: 13.9 % (ref 11.5–15.5)
WBC: 17.9 10*3/uL — ABNORMAL HIGH (ref 4.0–10.5)

## 2017-06-29 LAB — PROTIME-INR
INR: 1.28
PROTHROMBIN TIME: 15.9 s — AB (ref 11.4–15.2)

## 2017-06-29 LAB — GLUCOSE, CAPILLARY
GLUCOSE-CAPILLARY: 287 mg/dL — AB (ref 65–99)
GLUCOSE-CAPILLARY: 228 mg/dL — AB (ref 65–99)
GLUCOSE-CAPILLARY: 276 mg/dL — AB (ref 65–99)
GLUCOSE-CAPILLARY: 250 mg/dL — AB (ref 65–99)
Glucose-Capillary: 198 mg/dL — ABNORMAL HIGH (ref 65–99)
Glucose-Capillary: 155 mg/dL — ABNORMAL HIGH (ref 65–99)

## 2017-06-29 LAB — MAGNESIUM: Magnesium: 2 mg/dL (ref 1.7–2.4)

## 2017-06-29 LAB — LACTIC ACID, PLASMA
LACTIC ACID, VENOUS: 1.6 mmol/L (ref 0.5–1.9)
Lactic Acid, Venous: 1.8 mmol/L (ref 0.5–1.9)

## 2017-06-29 LAB — PROCALCITONIN: Procalcitonin: 0.51 ng/mL

## 2017-06-29 LAB — C-REACTIVE PROTEIN: CRP: 23.1 mg/dL — ABNORMAL HIGH (ref <1.0)

## 2017-06-29 LAB — APTT: APTT: 31 s (ref 24–36)

## 2017-06-29 LAB — SEDIMENTATION RATE: Sed Rate: 68 mm/hr — ABNORMAL HIGH (ref 0–16)

## 2017-06-29 LAB — TROPONIN I: Troponin I: 0.05 ng/mL (ref <0.03)

## 2017-06-29 LAB — HEMOGLOBIN A1C
HEMOGLOBIN A1C: 9.2 % — AB (ref 4.8–5.6)
Mean Plasma Glucose: 217.34 mg/dL

## 2017-06-29 LAB — PHOSPHORUS: Phosphorus: 2.9 mg/dL (ref 2.5–4.6)

## 2017-06-29 LAB — HIV ANTIBODY (ROUTINE TESTING W REFLEX): HIV SCREEN 4TH GENERATION: NONREACTIVE

## 2017-06-29 LAB — MRSA PCR SCREENING: MRSA by PCR: NEGATIVE

## 2017-06-29 LAB — PREALBUMIN: Prealbumin: <5 mg/dL — ABNORMAL LOW (ref 18–38)

## 2017-06-29 MED ORDER — PRO-STAT SUGAR FREE PO LIQD
30.0000 mL | Freq: Two times a day (BID) | ORAL | Status: DC
  Administered 2017-06-29 – 2017-07-01 (×5): 30 mL via ORAL
  Filled 2017-06-29 (×5): qty 30

## 2017-06-29 MED ORDER — ADULT MULTIVITAMIN W/MINERALS CH
1.0000 | ORAL_TABLET | Freq: Every day | ORAL | Status: DC
  Administered 2017-06-29 – 2017-07-03 (×4): 1 via ORAL
  Filled 2017-06-29 (×5): qty 1

## 2017-06-29 MED ORDER — ACETAMINOPHEN 325 MG PO TABS
650.0000 mg | ORAL_TABLET | Freq: Four times a day (QID) | ORAL | Status: DC | PRN
  Administered 2017-07-07 – 2017-07-09 (×2): 650 mg via ORAL
  Filled 2017-06-29 (×2): qty 2

## 2017-06-29 MED ORDER — VANCOMYCIN HCL IN DEXTROSE 1-5 GM/200ML-% IV SOLN
1000.0000 mg | Freq: Two times a day (BID) | INTRAVENOUS | Status: DC
  Administered 2017-06-29 – 2017-06-30 (×3): 1000 mg via INTRAVENOUS
  Filled 2017-06-29 (×5): qty 200

## 2017-06-29 MED ORDER — ONDANSETRON HCL 4 MG/2ML IJ SOLN: 4.0000 mg | Freq: Four times a day (QID) | INTRAMUSCULAR | Status: DC | PRN

## 2017-06-29 MED ORDER — CHLORHEXIDINE GLUCONATE 0.12 % MT SOLN
15.0000 mL | Freq: Two times a day (BID) | OROMUCOSAL | Status: DC
  Administered 2017-06-29 – 2017-07-04 (×10): 15 mL via OROMUCOSAL
  Filled 2017-06-29 (×6): qty 15

## 2017-06-29 MED ORDER — TAMSULOSIN HCL 0.4 MG PO CAPS
0.4000 mg | ORAL_CAPSULE | Freq: Every day | ORAL | Status: DC
  Administered 2017-06-29 – 2017-07-11 (×11): 0.4 mg via ORAL
  Filled 2017-06-29 (×12): qty 1

## 2017-06-29 MED ORDER — HYDROCODONE-ACETAMINOPHEN 5-325 MG PO TABS: 1.0000 | ORAL_TABLET | ORAL | Status: DC | PRN

## 2017-06-29 MED ORDER — INSULIN ASPART 100 UNIT/ML ~~LOC~~ SOLN
0.0000 [IU] | Freq: Three times a day (TID) | SUBCUTANEOUS | Status: DC
  Administered 2017-06-29: 5 [IU] via SUBCUTANEOUS
  Administered 2017-06-30 – 2017-07-01 (×6): 3 [IU] via SUBCUTANEOUS

## 2017-06-29 MED ORDER — ATORVASTATIN CALCIUM 40 MG PO TABS
40.0000 mg | ORAL_TABLET | Freq: Every day | ORAL | Status: DC
  Administered 2017-06-29 – 2017-07-11 (×12): 40 mg via ORAL
  Filled 2017-06-29 (×12): qty 1

## 2017-06-29 MED ORDER — INSULIN ASPART 100 UNIT/ML ~~LOC~~ SOLN
0.0000 [IU] | Freq: Every day | SUBCUTANEOUS | Status: DC
  Administered 2017-06-29 – 2017-07-01 (×2): 2 [IU] via SUBCUTANEOUS

## 2017-06-29 MED ORDER — ONDANSETRON HCL 4 MG PO TABS: 4.0000 mg | ORAL_TABLET | Freq: Four times a day (QID) | ORAL | Status: DC | PRN

## 2017-06-29 MED ORDER — INSULIN ASPART 100 UNIT/ML ~~LOC~~ SOLN
3.0000 [IU] | Freq: Three times a day (TID) | SUBCUTANEOUS | Status: DC
  Administered 2017-06-29 – 2017-07-01 (×7): 3 [IU] via SUBCUTANEOUS

## 2017-06-29 MED ORDER — INSULIN ASPART 100 UNIT/ML ~~LOC~~ SOLN
0.0000 [IU] | SUBCUTANEOUS | Status: DC
  Administered 2017-06-29: 2 [IU] via SUBCUTANEOUS
  Administered 2017-06-29: 5 [IU] via SUBCUTANEOUS
  Administered 2017-06-29: 2 [IU] via SUBCUTANEOUS

## 2017-06-29 MED ORDER — CARVEDILOL 6.25 MG PO TABS
6.2500 mg | ORAL_TABLET | Freq: Two times a day (BID) | ORAL | Status: DC
  Administered 2017-06-29 – 2017-07-02 (×7): 6.25 mg via ORAL
  Filled 2017-06-29 (×3): qty 1
  Filled 2017-06-29: qty 2
  Filled 2017-06-29 (×3): qty 1

## 2017-06-29 MED ORDER — INSULIN GLARGINE 100 UNIT/ML ~~LOC~~ SOLN
10.0000 [IU] | Freq: Every day | SUBCUTANEOUS | Status: DC
  Administered 2017-06-29 – 2017-07-06 (×6): 10 [IU] via SUBCUTANEOUS
  Filled 2017-06-29 (×7): qty 0.1

## 2017-06-29 MED ORDER — ORAL CARE MOUTH RINSE
15.0000 mL | Freq: Two times a day (BID) | OROMUCOSAL | Status: DC
  Administered 2017-06-30 – 2017-07-01 (×3): 15 mL via OROMUCOSAL

## 2017-06-29 MED ORDER — PIPERACILLIN-TAZOBACTAM 3.375 G IVPB 30 MIN: 3.3750 g | Freq: Once | INTRAVENOUS | Status: DC

## 2017-06-29 MED ORDER — ACETAMINOPHEN 650 MG RE SUPP: 650.0000 mg | Freq: Four times a day (QID) | RECTAL | Status: DC | PRN

## 2017-06-29 NOTE — Progress Notes (Signed)
Patient was unable to urinate. Bladder scanner showed over . In and Out x1 per protocol. Output 1200. Post residual was 30. Patient was drowsy throughout the day. Wakes upon calling but goes back to sleep. VSS. MD made aware. Will continue to monitor.

## 2017-06-29 NOTE — Care Management Note (Signed)
Case Management Note  Patient Details  Name: Miguel Harvey MRN: [id number] Date of Birth: [birth year]  Subjective/Objective:  Pt presented for diabetic foot ulcerations and left toe osteomyelitis. CM did receive consult for [facility] Needs- Pt will need HH PT/OT to make recommendations. Pt will most likely benefit from SNF once stable.               Action/Plan: CM will continue to monitor for additional needs.   Expected Discharge Date:  Expected Discharge Plan:  Skilled Nursing Facility  In-House Referral:  Clinical Social Work  Discharge planning Services  CM Consult  Post Acute Care Choice:    Choice offered to:     DME Arranged:    DME Agency:     HH Arranged:    HH Agency:     Status of Service:  In process, will continue to follow  If discussed at Long Length of Stay Meetings, dates discussed:    Additional Comments:  Gala Lewandowsky, RN 06/29/2017, 3:49 PM

## 2017-06-29 NOTE — Consult Note (Signed)
WOC Nurse wound consult note Reason for Consult: Right great toe with ulceration on plantar aspect (full thickness), intertriginous dermatitis between right great toe (hallux) and second digit and cellulitis/infection vs gangrene of the right great toe Wound type: infectious, neuropathic Pressure Injury POA: N/A Measurement:Plantar ulcer measures 3cm x 3cm with movable, but non-viable skin flap covering part of wound bed. Wound bed: dry, dark Drainage (amount, consistency, odor)  Periwound:Maceration and infected tissue in the intertriginous space extending to dorsal area of foot at base or RGT. Yellow exudate, foul odor associated with necrosis. Dressing procedure/placement/frequency: I have implemented a conservative POC that includes dressing twice daily with a silver hydrofiber between the toes and a saline moistened gauze dressing to th 1st metatarsal head full thickness wound.  Suggest Orthopedic or Vascular consultation for further input on salvageability of RGT. If you agree, please order/arrange consultation.  WOC nursing team will not follow, but will remain available to this patient, the nursing and medical teams.  Please re-consult if needed. Thanks, Ladona Mow, MSN, RN, GNP, [facility]  Pager# [phone]

## 2017-06-29 NOTE — Consult Note (Signed)
Reason for Consult:Foot ulcer Referring Physician: Javarius Harvey is an 72 y.o. male with DM, CHF, HTN, and dyslipidemia HPI: Miguel Harvey came to the ED yesterday with a hx/o fatigue and falls. He was admitted and noted to have ulcerations and skin color changes on both feet and orthopedic surgery was consulted. He has uncontrolled DM. He notes the ulceration on the right foot has been there about a month but hasn't really bothered him. He has not been doing anything to take care of it. He denies prior hx/o foot ulcers.  Past Medical History:  Diagnosis Date  . CHF (congestive heart failure) ([place])   . Diabetes mellitus   . Hypertension     Past Surgical History:  Procedure Laterality Date  . cyst removal from  right leg about 6 years ago    . LEFT HEART CATHETERIZATION WITH CORONARY ANGIOGRAM N/A 05/11/2011   Procedure: LEFT HEART CATHETERIZATION WITH CORONARY ANGIOGRAM;  Surgeon: Miguel Sine, MD;  Location: [facility] CATH LAB;  Service: Cardiovascular;  Laterality: N/A;    Family History  Problem Relation Age of Onset  . CAD Other   . Diabetes Other     Social History:  reports that he has been smoking cigarettes.  He has a 80.00 pack-year smoking history. He quit smokeless tobacco use about 6 years ago. He reports that he drinks about 3.6 oz of alcohol per week. His drug history is not on file.  Allergies: No Known Allergies  Medications: I have reviewed the patient's current medications.  Results for orders placed or performed during the hospital encounter of 06/28/17 (from the past 48 hour(s))  MRSA PCR Screening     Status: None   Collection Time: 06/28/17  2:59 AM  Result Value Ref Range   MRSA by PCR NEGATIVE NEGATIVE    Comment:        The GeneXpert MRSA Assay (FDA approved for NASAL specimens only), is one component of a comprehensive MRSA colonization surveillance program. It is not intended to diagnose MRSA infection nor to guide or monitor treatment for MRSA  infections. Performed at [facility], [place] [address] [place], [place] [address]   CBG monitoring, ED     Status: Abnormal   Collection Time: 06/28/17  5:58 PM  Result Value Ref Range   Glucose-Capillary 250 (H) 65 - 99 mg/dL  I-Stat CG4 Lactic Acid, ED     Status: Abnormal   Collection Time: 06/28/17  6:16 PM  Result Value Ref Range   Lactic Acid, Venous 2.34 (HH) 0.5 - 1.9 mmol/L   Comment NOTIFIED PHYSICIAN   Comprehensive metabolic panel     Status: Abnormal   Collection Time: 06/28/17  6:33 PM  Result Value Ref Range   Sodium 134 (L) 135 - 145 mmol/L   Potassium 4.1 3.5 - 5.1 mmol/L   Chloride 93 (L) 101 - 111 mmol/L   CO2 21 (L) 22 - 32 mmol/L   Glucose, Bld 277 (H) 65 - 99 mg/dL   BUN 21 (H) 6 - 20 mg/dL   Creatinine, Ser 0.93 0.61 - 1.24 mg/dL   Calcium 8.2 (L) 8.9 - 10.3 mg/dL   Total Protein 6.6 6.5 - 8.1 g/dL   Albumin 2.5 (L) 3.5 - 5.0 g/dL   AST 46 (H) 15 - 41 U/L   ALT 41 17 - 63 U/L   Alkaline Phosphatase 86 38 - 126 U/L   Total Bilirubin 2.1 (H) 0.3 - 1.2 mg/dL  GFR calc non Af Amer >60 >60 mL/min   GFR calc Af Amer >60 >60 mL/min    Comment: (NOTE) The eGFR has been calculated using the CKD EPI equation. This calculation has not been validated in all clinical situations. eGFR's persistently <60 mL/min signify possible Chronic Kidney Disease.    Anion gap 20 (H) 5 - 15    Comment: Performed at [facility], [place] [address], [place], [place] [address]  CBC WITH DIFFERENTIAL     Status: Abnormal   Collection Time: 06/28/17  6:33 PM  Result Value Ref Range   WBC 19.2 (H) 4.0 - 10.5 K/uL   RBC 4.52 4.22 - 5.81 MIL/uL   Hemoglobin 13.3 13.0 - 17.0 g/dL   HCT 38.8 (L) 39.0 - 52.0 %   MCV 85.8 78.0 - 100.0 fL   MCH 29.4 26.0 - 34.0 pg   MCHC 34.3 30.0 - 36.0 g/dL   RDW 14.0 11.5 - 15.5 %   Platelets 247 150 - 400 K/uL   Neutrophils Relative % 87 %   Lymphocytes Relative 7 %   Monocytes Relative 6 %   Eosinophils Relative 0 %    Basophils Relative 0 %   Neutro Abs 16.7 (H) 1.7 - 7.7 K/uL   Lymphs Abs 1.3 0.7 - 4.0 K/uL   Monocytes Absolute 1.2 (H) 0.1 - 1.0 K/uL   Eosinophils Absolute 0.0 0.0 - 0.7 K/uL   Basophils Absolute 0.0 0.0 - 0.1 K/uL   Smear Review MORPHOLOGY UNREMARKABLE     Comment: Performed at [facility], [place] [address], [place], [place] [address]  Blood Culture (routine x 2)     Status: None (Preliminary result)   Collection Time: 06/28/17  6:33 PM  Result Value Ref Range   Specimen Description      BLOOD BLOOD LEFT ARM Performed at [facility] [address] [place], [place] [address]    Special Requests      BOTTLES DRAWN AEROBIC AND ANAEROBIC Blood Culture adequate volume Performed at [facility] [address], [place], [place] [address]    Culture      NO GROWTH < 12 HOURS Performed at [facility] [address], [place], [place] [address]    Report Status PENDING   Urinalysis, Routine w reflex microscopic     Status: Abnormal   Collection Time: 06/28/17  6:33 PM  Result Value Ref Range   Color, Urine AMBER (A) YELLOW    Comment: BIOCHEMICALS MAY BE AFFECTED BY COLOR   APPearance CLEAR CLEAR   Specific Gravity, Urine 1.021 1.005 - 1.030   pH 5.0 5.0 - 8.0   Glucose, UA >=500 (A) NEGATIVE mg/dL   Hgb urine dipstick SMALL (A) NEGATIVE   Bilirubin Urine NEGATIVE NEGATIVE   Ketones, ur 20 (A) NEGATIVE mg/dL   Protein, ur 30 (A) NEGATIVE mg/dL   Nitrite NEGATIVE NEGATIVE   Leukocytes, UA NEGATIVE NEGATIVE   RBC / HPF 0-5 0 - 5 RBC/hpf   WBC, UA 0-5 0 - 5 WBC/hpf   Bacteria, UA NONE SEEN NONE SEEN   Squamous Epithelial / LPF NONE SEEN NONE SEEN   Mucus PRESENT    Hyaline Casts, UA PRESENT     Comment: Performed at [facility], [place] [address], [place], [place] [address]  Troponin I (q 6hr x 3)     Status: Abnormal   Collection Time: 06/28/17  6:33 PM  Result Value Ref  Range   Troponin I  0.04 (HH) <0.03 ng/mL    Comment: CRITICAL RESULT CALLED TO, READ BACK BY AND VERIFIED WITH: Miguel Harvey _0  06/28/17 Miguel Harvey Performed at [facility], [place] [address] [place], [place] [address]   Brain natriuretic peptide     Status: Abnormal   Collection Time: 06/28/17  6:33 PM  Result Value Ref Range   B Natriuretic Peptide 710.8 (H) 0.0 - 100.0 pg/mL    Comment: Performed at [facility], [place] [address], [place], [place] [address]  I-Stat CG4 Lactic Acid, ED     Status: None   Collection Time: 06/28/17  9:06 PM  Result Value Ref Range   Lactic Acid, Venous 1.69 0.5 - 1.9 mmol/L  Troponin I (q 6hr x 3)     Status: Abnormal   Collection Time: 06/28/17 11:25 PM  Result Value Ref Range   Troponin I 0.05 (HH) <0.03 ng/mL    Comment: CRITICAL VALUE NOTED.  VALUE IS CONSISTENT WITH PREVIOUSLY REPORTED AND CALLED VALUE. Performed at [facility], [place] [address] [place], [place] [address]   Lactic acid, plasma     Status: None   Collection Time: 06/29/17  1:14 AM  Result Value Ref Range   Lactic Acid, Venous 1.6 0.5 - 1.9 mmol/L    Comment: Performed at [facility], [place] [address] [place], [place] [address]  Procalcitonin     Status: None   Collection Time: 06/29/17  1:14 AM  Result Value Ref Range   Procalcitonin 0.51 ng/mL    Comment:        Interpretation: PCT > 0.5 ng/mL and <= 2 ng/mL: Systemic infection (sepsis) is possible, but other conditions are known to elevate PCT as well. (NOTE)       Sepsis PCT Algorithm           Lower Respiratory Tract                                      Infection PCT Algorithm    ----------------------------     ----------------------------         PCT < 0.25 ng/mL                PCT < 0.10 ng/mL         Strongly encourage             Strongly discourage   discontinuation of antibiotics    initiation of antibiotics    ----------------------------      -----------------------------       PCT 0.25 - 0.50 ng/mL            PCT 0.10 - 0.25 ng/mL               OR       >80% decrease in PCT            Discourage initiation of                                            antibiotics      Encourage discontinuation           of antibiotics    ----------------------------     -----------------------------         PCT >=  0.50 ng/mL              PCT 0.26 - 0.50 ng/mL                AND       <80% decrease in PCT             Encourage initiation of                                             antibiotics       Encourage continuation           of antibiotics    ----------------------------     -----------------------------        PCT >= 0.50 ng/mL                  PCT > 0.50 ng/mL               AND         increase in PCT                  Strongly encourage                                      initiation of antibiotics    Strongly encourage escalation           of antibiotics                                     -----------------------------                                           PCT <= 0.25 ng/mL                                                 OR                                        > 80% decrease in PCT                                     Discontinue / Do not initiate                                             antibiotics Performed at [facility], [address], [place], [place] [address]   Protime-INR     Status: Abnormal   Collection Time: 06/29/17  1:14 AM  Result Value Ref Range   Prothrombin Time 15.9 (H) 11.4 - 15.2 seconds   INR 1.28     Comment: Performed at [facility] [address] [address] [place], [place] [address]  APTT     Status: None   Collection Time: 06/29/17  1:14 AM  Result Value Ref Range   aPTT 31 24 - 36 seconds    Comment: Performed at [facility], [place] [address], [place], [place] [address]  Sedimentation rate     Status: Abnormal   Collection Time: 06/29/17  1:14 AM  Result Value Ref Range    Sed Rate 68 (H) 0 - 16 mm/hr    Comment: Performed at [facility] [address], [place], [place] [address]  C-reactive protein     Status: Abnormal   Collection Time: 06/29/17  1:14 AM  Result Value Ref Range   CRP 23.1 (H) <1.0 mg/dL    Comment: Performed at [facility] [address] [place], [place] [address]  Hemoglobin A1c     Status: Abnormal   Collection Time: 06/29/17  1:20 AM  Result Value Ref Range   Hgb A1c MFr Bld 9.2 (H) 4.8 - 5.6 %    Comment: (NOTE) Pre diabetes:          5.7%-6.4% Diabetes:              >6.4% Glycemic control for   <7.0% adults with diabetes    Mean Plasma Glucose 217.34 mg/dL    Comment: Performed at [facility] [address] [place], [place] [address]  Glucose, capillary     Status: Abnormal   Collection Time: 06/29/17  1:33 AM  Result Value Ref Range   Glucose-Capillary 287 (H) 65 - 99 mg/dL  Lactic acid, plasma     Status: None   Collection Time: 06/29/17  3:56 AM  Result Value Ref Range   Lactic Acid, Venous 1.8 0.5 - 1.9 mmol/L    Comment: Performed at [facility], [place] [address] [place], [place] [address]  Prealbumin     Status: Abnormal   Collection Time: 06/29/17  3:56 AM  Result Value Ref Range   Prealbumin <5 (L) 18 - 38 mg/dL    Comment: Performed at [facility] [address] [place], [place] [address]  Magnesium     Status: None   Collection Time: 06/29/17  3:56 AM  Result Value Ref Range   Magnesium 2.0 1.7 - 2.4 mg/dL    Comment: Performed at [facility] [address], [place], [place] [address]  Phosphorus     Status: None   Collection Time: 06/29/17  3:56 AM  Result Value Ref Range   Phosphorus 2.9 2.5 - 4.6 mg/dL    Comment: Performed at [facility] [address] [place],  [address]  TSH     Status: None   Collection Time: 06/29/17  3:56 AM  Result Value Ref Range   TSH 3.023 0.350 - 4.500 uIU/mL    Comment: Performed by a 3rd Generation assay  with a functional sensitivity of <=0.01 uIU/mL. Performed at [facility], [place] [address] [place],  [address]   Comprehensive metabolic panel     Status: Abnormal   Collection Time: 06/29/17  3:56 AM  Result Value Ref Range   Sodium 134 (L) 135 - 145 mmol/L   Potassium 3.5 3.5 - 5.1 mmol/L   Chloride 96 (L) 101 - 111 mmol/L   CO2 23 22 - 32 mmol/L   Glucose, Bld 236 (H) 65 - 99 mg/dL   BUN 15 6 - 20 mg/dL   Creatinine,  Ser 0.94 0.61 - 1.24 mg/dL   Calcium 8.2 (L) 8.9 - 10.3 mg/dL   Total Protein 6.0 (L) 6.5 - 8.1 g/dL   Albumin 2.2 (L) 3.5 - 5.0 g/dL   AST 35 15 - 41 U/L   ALT 36 17 - 63 U/L   Alkaline Phosphatase 80 38 - 126 U/L   Total Bilirubin 1.5 (H) 0.3 - 1.2 mg/dL   GFR calc non Af Amer >60 >60 mL/min   GFR calc Af Amer >60 >60 mL/min    Comment: (NOTE) The eGFR has been calculated using the CKD EPI equation. This calculation has not been validated in all clinical situations. eGFR's persistently <60 mL/min signify possible Chronic Kidney Disease.    Anion gap 15 5 - 15    Comment: Performed at [facility] [address], [place], [place] [address]  CBC     Status: Abnormal   Collection Time: 06/29/17  3:56 AM  Result Value Ref Range   WBC 17.9 (H) 4.0 - 10.5 K/uL   RBC 4.47 4.22 - 5.81 MIL/uL   Hemoglobin 12.5 (L) 13.0 - 17.0 g/dL   HCT 38.0 (L) 39.0 - 52.0 %   MCV 85.0 78.0 - 100.0 fL   MCH 28.0 26.0 - 34.0 pg   MCHC 32.9 30.0 - 36.0 g/dL   RDW 13.9 11.5 - 15.5 %   Platelets 247 150 - 400 K/uL    Comment: Performed at [facility], [place] [address], [place], [place] [address]  Glucose, capillary     Status: Abnormal   Collection Time: 06/29/17  4:52 AM  Result Value Ref Range   Glucose-Capillary 198 (H) 65 - 99 mg/dL  Glucose, capillary     Status: Abnormal   Collection Time: 06/29/17  7:40 AM  Result Value Ref Range   Glucose-Capillary 155 (H) 65 - 99 mg/dL  Glucose, capillary     Status: Abnormal   Collection Time: 06/29/17 12:05  PM  Result Value Ref Range   Glucose-Capillary 228 (H) 65 - 99 mg/dL    Dg Chest 2 View  Result Date: 06/28/2017 CLINICAL DATA:  History of smoking and congestive heart failure. EXAM: CHEST - 2 VIEW COMPARISON:  05/08/2011; chest CT - 05/17/2012 FINDINGS: Grossly unchanged cardiac silhouette and mediastinal contours. Minimal bilateral infrahilar opacities are unchanged and favored to represent atelectasis. No new focal airspace opacities. No pleural effusion or pneumothorax. No evidence of edema. No acute osseous abnormalities. Stigmata of DISH within the thoracic spine. IMPRESSION: No acute cardiopulmonary disease. Electronically Signed   By: Sandi Mariscal M.D.   On: 06/28/2017 19:30   Dg Foot Complete Left  Result Date: 06/28/2017 CLINICAL DATA:  Necrosis involving the bilateral feet EXAM: LEFT FOOT - COMPLETE 3+ VIEW COMPARISON:  None. FINDINGS: There is absence of near the entirety of the fifth digit with only minimal amount of the base of the proximal phalanx remaining. No fracture or dislocation. Mild degenerative change involving the first MTP joint. No significant hallux valgus deformity. No erosions. Small plantar calcaneal spur. Enthesopathic change involving the Achilles tendon insertion site. 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 Further evaluation with MRI could performed as indicated. Electronically Signed   By: Sandi Mariscal M.D.   On: 06/28/2017 19:35   Dg Foot Complete Right  Result Date: 06/28/2017 CLINICAL DATA:  Necrosis of the bilateral feet. EXAM: RIGHT FOOT COMPLETE - 3+ VIEW COMPARISON:  None. FINDINGS: No fracture or dislocation. There is  an apparent ulcer involving weight-bearing surface about the head of the first metatarsal. Minimal amount of adjacent subcutaneous emphysema. No definite radiopaque foreign body. No discrete areas of osteolysis to suggest osteomyelitis. 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 Note  is made of an os tibialis externum. Small plantar calcaneal spur. Minimal enthesopathic change involving the Achilles tendon insertion site. IMPRESSION: Suspected ulcer about the weight-bearing surface about the head of the first metatarsal with adjacent subcutaneous emphysema but without radiographic evidence of osteomyelitis. Further evaluation with MRI could be performed as clinically Electronically Signed   By: Sandi Mariscal M.D.   On: 06/28/2017 19:33    Review of Systems  Constitutional: Negative for weight loss.  HENT: Negative for ear discharge, ear pain, hearing loss and tinnitus.   Eyes: Negative for blurred vision, double vision, photophobia and pain.  Respiratory: Negative for cough, sputum production and shortness of breath.   Cardiovascular: Negative for chest pain.  Gastrointestinal: Negative for abdominal pain, nausea and vomiting.  Genitourinary: Negative for dysuria, flank pain, frequency and urgency.  Musculoskeletal: Positive for joint pain (Right foot, mild and intermittent). Negative for back pain, falls, myalgias and neck pain.  Neurological: Negative for dizziness, tingling, sensory change, focal weakness, loss of consciousness and headaches.  Endo/Heme/Allergies: Does not bruise/bleed easily.  Psychiatric/Behavioral: Negative for depression, memory loss and substance abuse. The patient is not nervous/anxious.    Blood pressure (!) 158/99, pulse 94, temperature 98.7 F (37.1 C), temperature source Oral, resp. rate 19, height _0  (1.753 m), weight 94.7 kg (208 lb 12.4 oz), SpO2 100 %. Physical Exam  Constitutional: He appears well-developed and well-nourished. No distress.  HENT:  Head: Normocephalic and atraumatic.  Eyes: Conjunctivae are normal. Right eye exhibits no discharge. Left eye exhibits no discharge. No scleral icterus.  Neck: Normal range of motion.  Cardiovascular: Normal rate and regular rhythm.  Respiratory: Effort normal. No respiratory distress.   Musculoskeletal:  RLE No traumatic wounds, ecchymosis, or rash  Minimal TTP, diffuse, not well reproducible, large ulceration with odor  No knee or ankle effusion  Knee stable to varus/ valgus and anterior/posterior stress  Sens SPN, TN paresthetic, DPN absent  Motor EHL, ext, flex, evers 5/5  DP 0, PT 0, No significant edema  LLE No traumatic wounds, ecchymosis, or rash  Nontender, left 5th toe with expressible purulence  No knee or ankle effusion  Knee stable to varus/ valgus and anterior/posterior stress  Sens DPN, SPN, TN paresthetic  Motor EHL, ext, flex, evers 5/5  DP 0, PT 0, No significant edema  Neurological: He is alert.  Skin: Skin is warm and dry. He is not diaphoretic.  Psychiatric: He has a normal mood and affect. His behavior is normal.    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 Will await results of ABI as I suspect this has a vascular component that will need to be corrected before amputation. Would continue current broad-spectrum abx. Dr. Sharol Given to assess either this afternoon or in AM.    Lisette Abu, PA-C Orthopedic Surgery [phone] 06/29/2017, 1:56 PM

## 2017-06-29 NOTE — Progress Notes (Signed)
Inpatient Diabetes Program Recommendations  AACE/ADA: New Consensus Statement on Inpatient Glycemic Control (2015)  Target Ranges:  Prepandial:   less than 140 mg/dL      Peak postprandial:   less than 180 mg/dL (1-2 hours)      Critically ill patients:  140 - 180 mg/dL   Review of Glycemic Control  Diabetes history: DM 2 Outpatient Diabetes medications: Patient reports not taking anything Current orders for Inpatient glycemic control: Lantus 10 units, Novolog Sensitive Correction 0-9 units tid + Novolog HS scale 0-5 units + Novolog 3 units tid meal coverage  Inpatient Diabetes Program Recommendations:    Spoke with patient about diabetes and home regimen for diabetes control. Patient reports that he has not seen anyone for DM and does not take any medication for DM. Patient lethargic at the time of discussion. Spoke with patient about the importance of glucose control and effects of high glucose on wound healing. Patient reports he does check his glucose and that the levels vary. Will have to see patient again tomorrow  A1c 9.2% this admission  Thanks,  Christena Deem RN, MSN, [facility] Inpatient Diabetes Coordinator Team Pager [phone] (8a-5p)

## 2017-06-29 NOTE — Progress Notes (Signed)
Initial Nutrition Assessment  DOCUMENTATION CODES:   Obesity unspecified  INTERVENTION:    Pro-stat 30 ml PO BID  Multivitamin daily  NUTRITION DIAGNOSIS:   Increased nutrient needs related to wound healing as evidenced by estimated needs.  GOAL:   Patient will meet greater than or equal to 90% of their needs  MONITOR:   PO intake, Supplement acceptance, Skin, Labs, I & O's  REASON FOR ASSESSMENT:   Consult Wound healing  ASSESSMENT:   72 yo male with PMH of HTN, DM, CHF, and smoker who was admitted on 4/1 with generalized weakness and inability to walk. Found to have gangrene necrosis and sloughing off of his first, second, and third toes on the right foot.  Patient not very communicative this morning. Encouraged adequate intake of protein for wound healing and reviewed good dietary sources of protein. Patient reports that he is very sleepy because he did not sleep well last night. He is eating well, consumed 100% of breakfast today.  Labs and medications reviewed.  Sodium 134 (L) CBG's: [id number]  8% weight loss within the past 6.5 months is not significant for the time frame. Weight loss could be related to fluid shifts with hx of CHF.   Patient is at risk for malnutrition, given recent weight loss and increased nutrient needs.  NUTRITION - FOCUSED PHYSICAL EXAM:    Most Recent Value  Orbital Region  No depletion  Upper Arm Region  No depletion  Thoracic and Lumbar Region  No depletion  Buccal Region  No depletion  Temple Region  No depletion  Clavicle Bone Region  No depletion  Clavicle and Acromion Bone Region  No depletion  Scapular Bone Region  No depletion  Dorsal Hand  No depletion  Patellar Region  No depletion  Anterior Thigh Region  No depletion  Posterior Calf Region  No depletion  Edema (RD Assessment)  Mild  Hair  Reviewed  Eyes  Reviewed  Mouth  Reviewed  Skin  Reviewed  Nails  Reviewed       Diet Order:  Diet Carb Modified Fluid  consistency: Thin; Room service appropriate? Yes  EDUCATION NEEDS:   Not appropriate for education at this time  Skin:  Skin Assessment: Skin Integrity Issues: Skin Integrity Issues:: Other (Comment) Other: R toe wound  Last BM:  4/1  Height:   Ht Readings from Last 1 Encounters:  06/29/17 5\' 9"  (1.753 m)    Weight:   Wt Readings from Last 1 Encounters:  06/29/17 208 lb 12.4 oz (94.7 kg)    Ideal Body Weight:  72.7 kg  BMI:  Body mass index is 30.83 kg/m.  Estimated Nutritional Needs:   Kcal:  2000-2200  Protein:  120-135 gm  Fluid:  2 L    Joaquin Courts, RD, LDN, CNSC Pager [phone] After Hours Pager [phone]

## 2017-06-29 NOTE — Progress Notes (Signed)
[facility]                                                                              Patient Demographics  Miguel Harvey, is a 72 y.o. male, DOB - [birth year], INO:[id number]  Admit date - 06/28/2017   Admitting Physician Miguel Baker, MD  Outpatient Primary MD for the patient is Gambino, Miguel Solomons, MD  Outpatient specialists:   LOS - 1  days   Medical records reviewed and are as summarized below:    Chief Complaint  Patient presents with  . Weakness       Brief summary   Patient is a 72 year old male with combined systolic and diastolic CHF, diabetes, hypertension, hyperlipidemia presented with inability to walk, generalized weakness for the past 2 days.  Reported ongoing recurrent falls recently.  Patient was found to have gangrenous necrosis and sloughing off of his first and second toes of the right leg with bilateral lower extremity edema.  Patient lives alone.  Stated that he did not seek medical attention as it was not hurting before.   Assessment & Plan    Principal Problem:   Diabetic foot infection (Miguel Harvey) with necrosis and foul-smelling order, right lower extremity -ESR, CRP elevated, x-ray foot suspected ulcer above the head of first metatarsal with adjacent subcutaneous emphysema but no radiographic evidence of osteomyelitis.  Further evaluation with MRI recommended -Follow ABI -Orthopedics consulted, d/w Miguel Harvey,  will follow recommendations -Left foot also with ulceration on the first toe   Active Problems: History of combined systolic and diastolic CHF  Currently no chest pain or active shortness of breath, follow 2D echo Last echo in 2013 showed EF of 40-45%, global hypokinesis  Sepsis: Secondary to diabetic foot infection  -Patient presented with hypotension, tachycardia, lactic acidosis, leukocytosis likely due to diabetic foot ulcer -Continue IV vancomycin and Zosyn, follow blood cultures -Orthopedics  consulted  [place] strongly on tobacco cessation continue nicotine patch   diabetes mellitus (Miguel Harvey): Type II, uncontrolled with hyperglycemia -Hold metformin, hemoglobin A1c 9.2 -Placed on Lantus 10 units at bedtime, NovoLog meal coverage 3 units TID AC, placed on sliding scale insulin  Essential hypertension -Restart Coreg, hydralazine IV as needed with parameters    Dyslipidemia -Continue Lipitor     Elevated troponin -Mildly elevated troponin, no chest pain or shortness of breath, possibly demand ischemia due to sepsis -Follow 2D echo   Code Status: Full CODE STATUS DVT Prophylaxis: Heparin subcu Family Communication: Discussed in detail with the patient, all imaging results, lab results explained to the patient    Disposition Plan:   Time Spent in minutes  35 minutes  Procedures:  None  Consultants:   Orthopedics  Antimicrobials:   IV vancomycin 4/1  IV Zosyn 4/1   Medications  Scheduled Meds: . atorvastatin  40 mg Oral Daily  . carvedilol  6.25 mg Oral BID WC  . chlorhexidine  15 mL Mouth Rinse BID  . feeding supplement (PRO-STAT SUGAR FREE 64)  30 mL Oral BID  . insulin aspart  0-9 Units Subcutaneous Q4H  . mouth rinse  15 mL Mouth Rinse q12n4p  .  multivitamin with minerals  1 tablet Oral Daily  . nicotine  14 mg Transdermal Daily   Continuous Infusions: . piperacillin-tazobactam (ZOSYN)  IV 3.375 g (06/29/17 1016)  . vancomycin Stopped (06/29/17 1012)   PRN Meds:.acetaminophen **OR** acetaminophen, HYDROcodone-acetaminophen, ondansetron **OR** ondansetron (ZOFRAN) IV   Antibiotics   Anti-infectives (From admission, onward)   Start     Dose/Rate Route Frequency Ordered Stop   06/29/17 0800  vancomycin (VANCOCIN) IVPB 1000 mg/200 mL premix     1,000 mg 200 mL/hr over 60 Minutes Intravenous Every 12 hours 06/29/17 0048     06/29/17 0200  piperacillin-tazobactam (ZOSYN) IVPB 3.375 g     3.375 g 12.5 mL/hr over 240 Minutes  Intravenous Every 8 hours 06/28/17 2202     06/29/17 0115  piperacillin-tazobactam (ZOSYN) IVPB 3.375 g  Status:  Discontinued     3.375 g 100 mL/hr over 30 Minutes Intravenous  Once 06/29/17 0101 06/29/17 0103   06/28/17 2115  vancomycin (VANCOCIN) IVPB 1000 mg/200 mL premix     1,000 mg 200 mL/hr over 60 Minutes Intravenous  Once 06/28/17 2112 06/28/17 2324   06/28/17 1830  piperacillin-tazobactam (ZOSYN) IVPB 3.375 g     3.375 g 100 mL/hr over 30 Minutes Intravenous  Once 06/28/17 1825 06/28/17 1910        Subjective:   Miguel Harvey was seen and examined today.  Eating breakfast at the time of my examination, denies any specific complaints except right foot is "hurting".  Patient denies dizziness, chest pain, shortness of breath, abdominal pain, N/V/D/C, No acute events overnight.    Objective:   Vitals:   06/29/17 0000 06/29/17 0046 06/29/17 0129 06/29/17 0501  BP: (!) 141/92  (!) 156/87 (!) 158/99  Pulse: 91  94   Resp: (!) 22  19   Temp:   98.7 F (37.1 C) 98.7 F (37.1 C)  TempSrc:   Axillary Oral  SpO2: 97%  100%   Weight:  102.1 kg (225 lb) 94.7 kg (208 lb 12.4 oz)   Height:  6' (1.829 m) 5' 9"  (1.753 m)     Intake/Output Summary (Last 24 hours) at 06/29/2017 1209 Last data filed at 06/29/2017 1100 Gross per 24 hour  Intake 1349.99 ml  Output -  Net 1349.99 ml     Wt Readings from Last 3 Encounters:  06/29/17 94.7 kg (208 lb 12.4 oz)  12/23/16 102.1 kg (225 lb)  10/11/12 104.3 kg (229 lb 14.4 oz)     Exam  General: Alert and oriented x2, NAD, unclear baseline  Eyes: PERRLA, EOMI, Anicteric Sclera,  HEENT:  Atraumatic, normocephalic, normal oropharynx  Cardiovascular: S1 S2 auscultated, no rubs, murmurs or gallops. Regular rate and rhythm.  Respiratory: Clear to auscultation bilaterally, no wheezing, rales or rhonchi  Gastrointestinal: Soft, nontender, nondistended, + bowel sounds  Ext: ulcer on the right upper foot, foul-smelling discharge with  necrosis and gangrene, on the left foot first toe ulcer  Neuro: moving all 4 extremities   musculoskeletal: No digital cyanosis, clubbing  Skin: No rashes  Psych: appears a little confused   Data Reviewed:  I have personally reviewed following labs and imaging studies  Micro Results Recent Results (from the past 240 hour(s))  MRSA PCR Screening     Status: None   Collection Time: 06/28/17  2:59 AM  Result Value Ref Range Status   MRSA by PCR NEGATIVE NEGATIVE Final    Comment:        The GeneXpert MRSA Assay (FDA approved  for NASAL specimens only), is one component of a comprehensive MRSA colonization surveillance program. It is not intended to diagnose MRSA infection nor to guide or monitor treatment for MRSA infections. Performed at [facility], [place] [address], [place], [place] [address]   Blood Culture (routine x 2)     Status: None (Preliminary result)   Collection Time: 06/28/17  6:33 PM  Result Value Ref Range Status   Specimen Description   Final    BLOOD BLOOD LEFT ARM Performed at [facility] [address] [place], [place] [address]    Special Requests   Final    BOTTLES DRAWN AEROBIC AND ANAEROBIC Blood Culture adequate volume Performed at [facility] [address], [place], [place] [address]    Culture   Final    NO GROWTH < 12 HOURS Performed at [facility] [address], [place],  [address]    Report Status PENDING  Incomplete    Radiology Reports Dg Chest 2 View  Result Date: 06/28/2017 CLINICAL DATA:  History of smoking and congestive heart failure. EXAM: CHEST - 2 VIEW COMPARISON:  05/08/2011; chest CT - 05/17/2012 FINDINGS: Grossly unchanged cardiac silhouette and mediastinal contours. Minimal bilateral infrahilar opacities are unchanged and favored to represent atelectasis. No new focal airspace opacities. No pleural effusion or pneumothorax. No evidence of edema. No acute osseous  abnormalities. Stigmata of DISH within the thoracic spine. IMPRESSION: No acute cardiopulmonary disease. Electronically Signed   By: Sandi Mariscal M.D.   On: 06/28/2017 19:30   Dg Foot Complete Left  Result Date: 06/28/2017 CLINICAL DATA:  Necrosis involving the bilateral feet EXAM: LEFT FOOT - COMPLETE 3+ VIEW COMPARISON:  None. FINDINGS: There is absence of near the entirety of the fifth digit with only minimal amount of the base of the proximal phalanx remaining. No fracture or dislocation. Mild degenerative change involving the first MTP joint. No significant hallux valgus deformity. No erosions. Small plantar calcaneal spur. Enthesopathic change involving the Achilles tendon insertion site. 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 Further evaluation with MRI could performed as indicated. Electronically Signed   By: Sandi Mariscal M.D.   On: 06/28/2017 19:35   Dg Foot Complete Right  Result Date: 06/28/2017 CLINICAL DATA:  Necrosis of the bilateral feet. EXAM: RIGHT FOOT COMPLETE - 3+ VIEW COMPARISON:  None. FINDINGS: No fracture or dislocation. There is an apparent ulcer involving weight-bearing surface about the head of the first metatarsal. Minimal amount of adjacent subcutaneous emphysema. No definite radiopaque foreign body. No discrete areas of osteolysis to suggest osteomyelitis. 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 Note is made of an os tibialis externum. Small plantar calcaneal spur. Minimal enthesopathic change involving the Achilles tendon insertion site. IMPRESSION: Suspected ulcer about the weight-bearing surface about the head of the first metatarsal with adjacent subcutaneous emphysema but without radiographic evidence of osteomyelitis. Further evaluation with MRI could be performed as clinically Electronically Signed   By: Sandi Mariscal M.D.   On: 06/28/2017 19:33    Lab Data:  CBC: Recent Labs  Lab 06/28/17 1833  06/29/17 0356  WBC 19.2* 17.9*  NEUTROABS 16.7*  --   HGB 13.3 12.5*  HCT 38.8* 38.0*  MCV 85.8 85.0  PLT 247 735   Basic Metabolic Panel: Recent Labs  Lab 06/28/17 1833 06/29/17 0356  NA 134* 134*  K 4.1 3.5  CL 93* 96*  CO2 21* 23  GLUCOSE 277* 236*  BUN 21* 15  CREATININE 0.93 0.94  CALCIUM 8.2* 8.2*  MG  --  2.0  PHOS  --  2.9   GFR: Estimated Creatinine Clearance: 80.7 mL/min (by C-G formula based on SCr of 0.94 mg/dL). Liver Function Tests: Recent Labs  Lab 06/28/17 1833 06/29/17 0356  AST 46* 35  ALT 41 36  ALKPHOS 86 80  BILITOT 2.1* 1.5*  PROT 6.6 6.0*  ALBUMIN 2.5* 2.2*   No results for input(s): LIPASE, AMYLASE in the last 168 hours. No results for input(s): AMMONIA in the last 168 hours. Coagulation Profile: Recent Labs  Lab 06/29/17 0114  INR 1.28   Cardiac Enzymes: Recent Labs  Lab 06/28/17 1833 06/28/17 2325  TROPONINI 0.04* 0.05*   BNP (last 3 results) No results for input(s): PROBNP in the last 8760 hours. HbA1C: Recent Labs    06/29/17 0120  HGBA1C 9.2*   CBG: Recent Labs  Lab 06/28/17 1758 06/29/17 0133 06/29/17 0452 06/29/17 0740 06/29/17 1205  GLUCAP 250* 287* 198* 155* 228*   Lipid Profile: No results for input(s): CHOL, HDL, LDLCALC, TRIG, CHOLHDL, LDLDIRECT in the last 72 hours. Thyroid Function Tests: Recent Labs    06/29/17 0356  TSH 3.023   Anemia Panel: No results for input(s): VITAMINB12, FOLATE, FERRITIN, TIBC, IRON, RETICCTPCT in the last 72 hours. Urine analysis:    Component Value Date/Time   COLORURINE AMBER (A) 06/28/2017 1833   APPEARANCEUR CLEAR 06/28/2017 1833   LABSPEC 1.021 06/28/2017 1833   PHURINE 5.0 06/28/2017 1833   GLUCOSEU >=500 (A) 06/28/2017 1833   HGBUR SMALL (A) 06/28/2017 1833   BILIRUBINUR NEGATIVE 06/28/2017 1833   KETONESUR 20 (A) 06/28/2017 1833   PROTEINUR 30 (A) 06/28/2017 1833   UROBILINOGEN 0.2 05/07/2011 1813   NITRITE NEGATIVE 06/28/2017 1833   LEUKOCYTESUR  NEGATIVE 06/28/2017 1833     Ripudeep Rai M.D. [facility] 06/29/2017, 12:09 PM  Pager: [phone] Between 7am to 7pm - call Pager - [phone]  After 7pm go to [email or web address] - password [email or web address]  Call night coverage person covering after 7pm

## 2017-06-29 NOTE — Progress Notes (Signed)
Patient had 7 beats of multifocal PVCs. Will continue to monitor.

## 2017-06-29 NOTE — ED Notes (Signed)
[facility] arrival to transport pt.

## 2017-06-30 ENCOUNTER — Inpatient Hospital Stay (HOSPITAL_COMMUNITY): Payer: Medicare Other

## 2017-06-30 DIAGNOSIS — I340 Nonrheumatic mitral (valve) insufficiency: Secondary | ICD-10-CM

## 2017-06-30 DIAGNOSIS — L0390 Cellulitis, unspecified: Secondary | ICD-10-CM

## 2017-06-30 DIAGNOSIS — A419 Sepsis, unspecified organism: Secondary | ICD-10-CM

## 2017-06-30 DIAGNOSIS — E43 Unspecified severe protein-calorie malnutrition: Secondary | ICD-10-CM

## 2017-06-30 DIAGNOSIS — E11628 Type 2 diabetes mellitus with other skin complications: Secondary | ICD-10-CM

## 2017-06-30 DIAGNOSIS — L089 Local infection of the skin and subcutaneous tissue, unspecified: Secondary | ICD-10-CM

## 2017-06-30 LAB — BLOOD CULTURE ID PANEL (REFLEXED)
ACINETOBACTER BAUMANNII: NOT DETECTED
CANDIDA PARAPSILOSIS: NOT DETECTED
CANDIDA TROPICALIS: NOT DETECTED
Candida albicans: NOT DETECTED
Candida glabrata: NOT DETECTED
Candida krusei: NOT DETECTED
Enterobacter cloacae complex: NOT DETECTED
Enterobacteriaceae species: NOT DETECTED
Enterococcus species: NOT DETECTED
Escherichia coli: NOT DETECTED
HAEMOPHILUS INFLUENZAE: NOT DETECTED
KLEBSIELLA OXYTOCA: NOT DETECTED
KLEBSIELLA PNEUMONIAE: NOT DETECTED
Listeria monocytogenes: NOT DETECTED
NEISSERIA MENINGITIDIS: NOT DETECTED
PROTEUS SPECIES: NOT DETECTED
Pseudomonas aeruginosa: NOT DETECTED
SERRATIA MARCESCENS: NOT DETECTED
STAPHYLOCOCCUS SPECIES: NOT DETECTED
STREPTOCOCCUS AGALACTIAE: NOT DETECTED
STREPTOCOCCUS SPECIES: DETECTED — AB
Staphylococcus aureus (BCID): NOT DETECTED
Streptococcus pneumoniae: NOT DETECTED
Streptococcus pyogenes: NOT DETECTED

## 2017-06-30 LAB — BASIC METABOLIC PANEL
Anion gap: 15 (ref 5–15)
BUN: 10 mg/dL (ref 6–20)
CALCIUM: 7.9 mg/dL — AB (ref 8.9–10.3)
CHLORIDE: 93 mmol/L — AB (ref 101–111)
CO2: 26 mmol/L (ref 22–32)
CREATININE: 0.99 mg/dL (ref 0.61–1.24)
GFR calc non Af Amer: >60 mL/min (ref >60)
GLUCOSE: 222 mg/dL — AB (ref 65–99)
Potassium: 3 mmol/L — ABNORMAL LOW (ref 3.5–5.1)
Sodium: 134 mmol/L — ABNORMAL LOW (ref 135–145)

## 2017-06-30 LAB — ECHOCARDIOGRAM COMPLETE
HEIGHTINCHES: 69 in
Weight: 3372.16 oz

## 2017-06-30 LAB — MAGNESIUM: Magnesium: 2.1 mg/dL (ref 1.7–2.4)

## 2017-06-30 LAB — GLUCOSE, CAPILLARY
GLUCOSE-CAPILLARY: 209 mg/dL — AB (ref 65–99)
Glucose-Capillary: 214 mg/dL — ABNORMAL HIGH (ref 65–99)
Glucose-Capillary: 235 mg/dL — ABNORMAL HIGH (ref 65–99)
Glucose-Capillary: 194 mg/dL — ABNORMAL HIGH (ref 65–99)

## 2017-06-30 MED ORDER — POTASSIUM CHLORIDE CRYS ER 20 MEQ PO TBCR
40.0000 meq | EXTENDED_RELEASE_TABLET | Freq: Once | ORAL | Status: AC
  Administered 2017-06-30: 40 meq via ORAL
  Filled 2017-06-30: qty 2

## 2017-06-30 MED ORDER — PIPERACILLIN-TAZOBACTAM 3.375 G IVPB
3.3750 g | Freq: Three times a day (TID) | INTRAVENOUS | Status: DC
  Administered 2017-06-30 – 2017-07-02 (×5): 3.375 g via INTRAVENOUS
  Filled 2017-06-30 (×6): qty 50

## 2017-06-30 MED ORDER — VANCOMYCIN HCL IN DEXTROSE 1-5 GM/200ML-% IV SOLN
1000.0000 mg | Freq: Two times a day (BID) | INTRAVENOUS | Status: DC
  Administered 2017-06-30 – 2017-07-01 (×3): 1000 mg via INTRAVENOUS
  Filled 2017-06-30 (×4): qty 200

## 2017-06-30 NOTE — Progress Notes (Signed)
  Echocardiogram 2D Echocardiogram has been performed.  Delcie Roch 06/30/2017, 10:16 AM

## 2017-06-30 NOTE — Progress Notes (Signed)
Pt had 6 beat run of NSVT. Vital sings stable, EKG done, pt asymptomatic. MD made aware and received new orders. Will continue to monitor pt.

## 2017-06-30 NOTE — Consult Note (Signed)
ORTHOPAEDIC CONSULTATION  REQUESTING PHYSICIAN: Edsel Petrin, DO  Chief Complaint: Necrotic painfull right forefoot  HPI: Miguel Harvey is a 72 y.o. male who presents with foul-smelling necrotic draining right forefoot ulceration.  Past Medical History:  Diagnosis Date  . CHF (congestive heart failure) (HCC)   . Diabetes mellitus   . Hypertension    Past Surgical History:  Procedure Laterality Date  . cyst removal from  right leg about 6 years ago    . LEFT HEART CATHETERIZATION WITH CORONARY ANGIOGRAM N/A 05/11/2011   Procedure: LEFT HEART CATHETERIZATION WITH CORONARY ANGIOGRAM;  Surgeon: Lennette Bihari, MD;  Location: [facility] CATH LAB;  Service: Cardiovascular;  Laterality: N/A;   Social History   Socioeconomic History  . Marital status: Single    Spouse name: Not on file  . Number of children: Not on file  . Years of education: Not on file  . Highest education level: Not on file  Occupational History  . Not on file  Social Needs  . Financial resource strain: Not on file  . Food insecurity:    Worry: Not on file    Inability: Not on file  . Transportation needs:    Medical: Not on file    Non-medical: Not on file  Tobacco Use  . Smoking status: Current Every Day Smoker    Packs/day: 2.00    Years: 40.00    Pack years: 80.00    Types: Cigarettes  . Smokeless tobacco: Former Neurosurgeon    Quit date: 05/07/2011  Substance and Sexual Activity  . Alcohol use: Yes    Alcohol/week: 3.6 oz    Types: 6 Cans of beer per week  . Drug use: Not on file  . Sexual activity: Not on file  Lifestyle  . Physical activity:    Days per week: Not on file    Minutes per session: Not on file  . Stress: Not on file  Relationships  . Social connections:    Talks on phone: Not on file    Gets together: Not on file    Attends religious service: Not on file    Active member of club or organization: Not on file    Attends meetings of clubs or organizations: Not on file    Relationship  status: Not on file  Other Topics Concern  . Not on file  Social History Narrative  . Not on file   Family History  Problem Relation Age of Onset  . CAD Other   . Diabetes Other    - negative except otherwise stated in the family history section No Known Allergies Prior to Admission medications   Medication Sig Start Date End Date Taking? Authorizing Provider  aspirin 325 MG tablet Take 325 mg by mouth daily.   Yes [provider]  atorvastatin (LIPITOR) 40 MG tablet take 1 tablet by mouth once daily Patient not taking: Reported on 12/23/2016 12/30/15   Beaulah Dinning, MD  carvedilol (COREG) 6.25 MG tablet take 1 tablet by mouth twice a day with meals Patient not taking: Reported on 12/23/2016 05/01/14   Briscoe Deutscher, DO  enalapril-hydrochlorothiazide (VASERETIC) 10-25 MG tablet take 1 tablet by mouth once daily Patient not taking: Reported on 12/23/2016 01/01/16   Beaulah Dinning, MD  metFORMIN (GLUCOPHAGE) 1000 MG tablet Take 0.5 tablets (500 mg total) by mouth 2 (two) times daily with a meal. Patient not taking: Reported on 12/23/2016 09/02/12   Brent Bulla, MD   Dg  Chest 2 View  Result Date: 06/28/2017 CLINICAL DATA:  History of smoking and congestive heart failure. EXAM: CHEST - 2 VIEW COMPARISON:  05/08/2011; chest CT - 05/17/2012 FINDINGS: Grossly unchanged cardiac silhouette and mediastinal contours. Minimal bilateral infrahilar opacities are unchanged and favored to represent atelectasis. No new focal airspace opacities. No pleural effusion or pneumothorax. No evidence of edema. No acute osseous abnormalities. Stigmata of DISH within the thoracic spine. IMPRESSION: No acute cardiopulmonary disease. Electronically Signed   By: Simonne Come M.D.   On: 06/28/2017 19:30   Dg Foot Complete Left  Result Date: 06/28/2017 CLINICAL DATA:  Necrosis involving the bilateral feet EXAM: LEFT FOOT - COMPLETE 3+ VIEW COMPARISON:  None. FINDINGS: There is absence of near the entirety  of the fifth digit with only minimal amount of the base of the proximal phalanx remaining. No fracture or dislocation. Mild degenerative change involving the first MTP joint. No significant hallux valgus deformity. No erosions. Small plantar calcaneal spur. Enthesopathic change involving the Achilles tendon insertion site. 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 Further evaluation with MRI could performed as indicated. Electronically Signed   By: Simonne Come M.D.   On: 06/28/2017 19:35   Dg Foot Complete Right  Result Date: 06/28/2017 CLINICAL DATA:  Necrosis of the bilateral feet. EXAM: RIGHT FOOT COMPLETE - 3+ VIEW COMPARISON:  None. FINDINGS: No fracture or dislocation. There is an apparent ulcer involving weight-bearing surface about the head of the first metatarsal. Minimal amount of adjacent subcutaneous emphysema. No definite radiopaque foreign body. No discrete areas of osteolysis to suggest osteomyelitis. 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 Note is made of an os tibialis externum. Small plantar calcaneal spur. Minimal enthesopathic change involving the Achilles tendon insertion site. IMPRESSION: Suspected ulcer about the weight-bearing surface about the head of the first metatarsal with adjacent subcutaneous emphysema but without radiographic evidence of osteomyelitis. Further evaluation with MRI could be performed as clinically Electronically Signed   By: Simonne Come M.D.   On: 06/28/2017 19:33   - pertinent xrays, CT, MRI studies were reviewed and independently interpreted  Positive ROS: All other systems have been reviewed and were otherwise negative with the exception of those mentioned in the HPI and as above.  Physical Exam: General: Alert, no acute distress Psychiatric: Patient is competent for consent with normal mood and affect Lymphatic: No axillary or cervical lymphadenopathy Cardiovascular: No pedal  edema Respiratory: No cyanosis, no use of accessory musculature GI: No organomegaly, abdomen is soft and non-tender  Skin: Examination there is necrotic ulceration to the right forefoot.  This does not extend past the midfoot.  There is no ascending cellulitis.  Images:  @ENCIMAGES @   Neurologic: Patient does not have protective sensation bilateral lower extremities.   MUSCULOSKELETAL:  Patient has a faintly palpable posterior tibial pulse bilaterally.  He does not have a palpable dorsalis pedis pulse bilaterally.  Review of the radiographs shows air in the soft tissue up to the midfoot on the right.  There is atrophic changes to the left foot but no open ulcers.  Patient is not on dialysis but is diabetic.  Albumin 2.2.  Hemoglobin 12.5 with a white blood cell count of 17.9.  Hemoglobin A1c 9.2.  Assessment: Assessment: Diabetic insensate neuropathy with peripheral vascular disease and coronary artery disease with ischemic necrotic changes to the right foot.  Severe protein caloric malnutrition.  Plan: Plan: Ankle-brachial indices are pending.  Patient will require a  right transtibial amputation.  We will plan for surgery on Friday, do not feel that a MRI scan is necessary at this time.  Thank you for the consult and the opportunity to see Mr. Haney Simonet, MD [facility] [phone] 7:22 AM

## 2017-06-30 NOTE — Progress Notes (Signed)
New order received for in and out cath every six hours as needed.

## 2017-06-30 NOTE — Progress Notes (Signed)
PROGRESS NOTE    Miguel Harvey  BWL:[id number] DOB: [birth year] DOA: 06/28/2017 PCP: Carlyle Dolly, MD   Brief Narrative:  HPI On 06/28/2017 by Dr. Phillips Hay Miguel Harvey is a 72 y.o. male with medical history significant of combined systolic and diastolic CHF, diabetes, hypertension dyslipidemia   Presented with   inability to walk and generalized weakness for past 2 days reported to ER staff that he was dragging himself around on all fours around the house has not been taking his medications had significant nausea and vomiting.  He has known history of diabetes but have not been followed up with primary care provider Has had ongoing recurrent falls recently.  Reports 2 weeks ago he did have a URI which was associated mild cough seems to have now improved with 3 days ago became so weak he could not ambulate reported difficult to sensation bilateral lower extremities.  Patient continues to smoke. Denies any fevers. No significant pain sensation in lower feet.  Interim history Found to have diabetic foot infection with necrosis.  Orthopedic surgery consulted and appreciated, plan for surgery on 07/02/2017. Assessment & Plan   Sepsis secondary to right Diabetic foot infection with necrosis -Presented with hypotension, tachycardia, leukocytosis with elevated lactic acid -X-ray of the foot showed suspected ulcer above the head of the first metatarsal with adjacent subcutaneous emphysema, no radiographical evidence of osteomyelitis -Blood cultures 1/2 Strep specious -ABI: Right 0.8, Left 0.79 -Patient with elevated ESR and CRP -Orthopedics consulted and appreciated, plan for surgery on 07/02/2017.  Dr. Sharol Given does not feel MRI needed at this time -Continue vancomycin and Zosyn  Chronic combined systolic and diastolic heart failure -Last echocardiogram 2013 showed an EF of 40-45%, global hypokinesis -Appears to be euvolemic -Echocardiogram EF 68%, grade 2 diastolic dysfunction -Need  to monitor intake and output, daily weights  NSVT -had 7 beat run of NSVT -vitals stable, asymptomatic  -Continue to replace potassium, check magnesium -echocardiogram as above  Hypokalemia -continue to replace and monitor BMP  Diabetes mellitus, type II -Hemoglobin A1c 9.2 -Metformin held -Continue Lantus, insulin sliding scale, CBG monitoring  Essential hypertension -Continue Coreg, and IV hydralazine as needed  Dyslipidemia -Continue statin  Elevated troponin -Suspect secondary to demand ischemia and sepsis -No complaints of chest pain  Tobacco abuse -Smoking cessation discussed, continue nicotine patch  DVT Prophylaxis  SCDs  Code Status: Full  Family Communication: None at bedside  Disposition Plan: Admitted. Pending surgery on 07/02/2017  [facility], Dr. Sharol Given  Procedures  Echocardiogram ABI  Antibiotics   Anti-infectives (From admission, onward)   Start     Dose/Rate Route Frequency Ordered Stop   06/30/17 2300  vancomycin (VANCOCIN) IVPB 1000 mg/200 mL premix     1,000 mg 200 mL/hr over 60 Minutes Intravenous Every 12 hours 06/30/17 1249     06/30/17 2000  piperacillin-tazobactam (ZOSYN) IVPB 3.375 g     3.375 g 12.5 mL/hr over 240 Minutes Intravenous Every 8 hours 06/30/17 1249     06/29/17 0800  vancomycin (VANCOCIN) IVPB 1000 mg/200 mL premix  Status:  Discontinued     1,000 mg 200 mL/hr over 60 Minutes Intravenous Every 12 hours 06/29/17 0048 06/30/17 1249   06/29/17 0200  piperacillin-tazobactam (ZOSYN) IVPB 3.375 g  Status:  Discontinued     3.375 g 12.5 mL/hr over 240 Minutes Intravenous Every 8 hours 06/28/17 2202 06/30/17 1249   06/29/17 0115  piperacillin-tazobactam (ZOSYN) IVPB 3.375 g  Status:  Discontinued  3.375 g 100 mL/hr over 30 Minutes Intravenous  Once 06/29/17 0101 06/29/17 0103   06/28/17 2115  vancomycin (VANCOCIN) IVPB 1000 mg/200 mL premix     1,000 mg 200 mL/hr over 60 Minutes Intravenous  Once 06/28/17 2112  06/28/17 2324   06/28/17 1830  piperacillin-tazobactam (ZOSYN) IVPB 3.375 g     3.375 g 100 mL/hr over 30 Minutes Intravenous  Once 06/28/17 1825 06/28/17 1910      Subjective:   Miguel Harvey seen and examined today.  Currently denies any new pain.  Denies chest pain, shortness of breath, abdominal pain, N/V/D/C.  Objective:   Vitals:   06/29/17 1505 06/29/17 2015 06/30/17 0538 06/30/17 1428  BP: 138/89 132/81 134/85   Pulse: 82 81 85   Resp: 15 17 13    Temp: 98.8 F (37.1 C) 97.9 F (36.6 C) 97.7 F (36.5 C) 98.1 F (36.7 C)  TempSrc: Oral Oral Oral Oral  SpO2: 94% 98% 99%   Weight:   95.6 kg (210 lb 12.2 oz)   Height:        Intake/Output Summary (Last 24 hours) at 06/30/2017 1536 Last data filed at 06/30/2017 0919 Gross per 24 hour  Intake 240 ml  Output 1930 ml  Net -1690 ml   Filed Weights   06/29/17 0046 06/29/17 0129 06/30/17 0538  Weight: 102.1 kg (225 lb) 94.7 kg (208 lb 12.4 oz) 95.6 kg (210 lb 12.2 oz)    Exam  General: Well developed, well nourished, NAD, appears stated age  HEENT: NCAT, mucous membranes moist.   Neck: Supple  Cardiovascular: S1 S2 auscultated, no rubs, murmurs or gallops.   Respiratory: Clear to auscultation bilaterally with equal chest rise  Abdomen: Soft, nontender, nondistended, + bowel sounds  Extremities: warm dry without cyanosis clubbing. Right foot with ulcer/necrosis/gangrene. Left foot ulcer-first toe.   Neuro: AAOx3, nonfocal  Psych: Normal affect and demeanor with intact judgement and insight   Data Reviewed: I have personally reviewed following labs and imaging studies  CBC: Recent Labs  Lab 06/28/17 1833 06/29/17 0356  WBC 19.2* 17.9*  NEUTROABS 16.7*  --   HGB 13.3 12.5*  HCT 38.8* 38.0*  MCV 85.8 85.0  PLT 247 917   Basic Metabolic Panel: Recent Labs  Lab 06/28/17 1833 06/29/17 0356 06/30/17 0325  NA 134* 134* 134*  K 4.1 3.5 3.0*  CL 93* 96* 93*  CO2 21* 23 26  GLUCOSE 277* 236* 222*  BUN  21* 15 10  CREATININE 0.93 0.94 0.99  CALCIUM 8.2* 8.2* 7.9*  MG  --  2.0  --   PHOS  --  2.9  --    GFR: Estimated Creatinine Clearance: 77 mL/min (by C-G formula based on SCr of 0.99 mg/dL). Liver Function Tests: Recent Labs  Lab 06/28/17 1833 06/29/17 0356  AST 46* 35  ALT 41 36  ALKPHOS 86 80  BILITOT 2.1* 1.5*  PROT 6.6 6.0*  ALBUMIN 2.5* 2.2*   No results for input(s): LIPASE, AMYLASE in the last 168 hours. No results for input(s): AMMONIA in the last 168 hours. Coagulation Profile: Recent Labs  Lab 06/29/17 0114  INR 1.28   Cardiac Enzymes: Recent Labs  Lab 06/28/17 1833 06/28/17 2325  TROPONINI 0.04* 0.05*   BNP (last 3 results) No results for input(s): PROBNP in the last 8760 hours. HbA1C: Recent Labs    06/29/17 0120  HGBA1C 9.2*   CBG: Recent Labs  Lab 06/29/17 1205 06/29/17 1556 06/29/17 2014 06/30/17 0742 06/30/17 1110  GLUCAP 228* 276* 250* 214* 235*   Lipid Profile: No results for input(s): CHOL, HDL, LDLCALC, TRIG, CHOLHDL, LDLDIRECT in the last 72 hours. Thyroid Function Tests: Recent Labs    06/29/17 0356  TSH 3.023   Anemia Panel: No results for input(s): VITAMINB12, FOLATE, FERRITIN, TIBC, IRON, RETICCTPCT in the last 72 hours. Urine analysis:    Component Value Date/Time   COLORURINE AMBER (A) 06/28/2017 1833   APPEARANCEUR CLEAR 06/28/2017 1833   LABSPEC 1.021 06/28/2017 1833   PHURINE 5.0 06/28/2017 1833   GLUCOSEU >=500 (A) 06/28/2017 1833   HGBUR SMALL (A) 06/28/2017 1833   BILIRUBINUR NEGATIVE 06/28/2017 1833   KETONESUR 20 (A) 06/28/2017 1833   PROTEINUR 30 (A) 06/28/2017 1833   UROBILINOGEN 0.2 05/07/2011 1813   NITRITE NEGATIVE 06/28/2017 1833   LEUKOCYTESUR NEGATIVE 06/28/2017 1833   Sepsis Labs: @LABRCNTIP (procalcitonin:4,lacticidven:4)  ) Recent Results (from the past 240 hour(s))  MRSA PCR Screening     Status: None   Collection Time: 06/28/17  2:59 AM  Result Value Ref Range Status   MRSA by PCR  NEGATIVE NEGATIVE Final    Comment:        The GeneXpert MRSA Assay (FDA approved for NASAL specimens only), is one component of a comprehensive MRSA colonization surveillance program. It is not intended to diagnose MRSA infection nor to guide or monitor treatment for MRSA infections. Performed at [facility], [place] [address], [place], [place] [address]   Blood Culture (routine x 2)     Status: None (Preliminary result)   Collection Time: 06/28/17  6:33 PM  Result Value Ref Range Status   Specimen Description BLOOD BLOOD LEFT ARM  Final   Special Requests   Final    BOTTLES DRAWN AEROBIC AND ANAEROBIC Blood Culture adequate volume Performed at [facility] [address], [place], [place] [address]    Culture  Setup Time   Final    GRAM POSITIVE COCCI IN CHAINS AEROBIC BOTTLE ONLY Organism ID to follow CRITICAL RESULT CALLED TO, READ BACK BY AND VERIFIED WITH: NOneita Hurt PHARMD, AT 3267 06/30/17 BY D. VANHOOK    Culture GRAM POSITIVE COCCI  Final   Report Status PENDING  Incomplete  Blood Culture ID Panel (Reflexed)     Status: Abnormal   Collection Time: 06/28/17  6:33 PM  Result Value Ref Range Status   Enterococcus species NOT DETECTED NOT DETECTED Final   Listeria monocytogenes NOT DETECTED NOT DETECTED Final   Staphylococcus species NOT DETECTED NOT DETECTED Final   Staphylococcus aureus NOT DETECTED NOT DETECTED Final   Streptococcus species DETECTED (A) NOT DETECTED Final    Comment: Not Enterococcus species, Streptococcus agalactiae, Streptococcus pyogenes, or Streptococcus pneumoniae. CRITICAL RESULT CALLED TO, READ BACK BY AND VERIFIED WITH: N. BATCHELDER PHARMD, AT 1245 06/30/17 BY D. VANHOOK    Streptococcus agalactiae NOT DETECTED NOT DETECTED Final   Streptococcus pneumoniae NOT DETECTED NOT DETECTED Final   Streptococcus pyogenes NOT DETECTED NOT DETECTED Final   Acinetobacter baumannii NOT DETECTED NOT DETECTED Final    Enterobacteriaceae species NOT DETECTED NOT DETECTED Final   Enterobacter cloacae complex NOT DETECTED NOT DETECTED Final   Escherichia coli NOT DETECTED NOT DETECTED Final   Klebsiella oxytoca NOT DETECTED NOT DETECTED Final   Klebsiella pneumoniae NOT DETECTED NOT DETECTED Final   Proteus species NOT DETECTED NOT DETECTED Final   Serratia marcescens NOT DETECTED NOT DETECTED Final   Haemophilus influenzae NOT DETECTED NOT DETECTED Final   Neisseria meningitidis  NOT DETECTED NOT DETECTED Final   Pseudomonas aeruginosa NOT DETECTED NOT DETECTED Final   Candida albicans NOT DETECTED NOT DETECTED Final   Candida glabrata NOT DETECTED NOT DETECTED Final   Candida krusei NOT DETECTED NOT DETECTED Final   Candida parapsilosis NOT DETECTED NOT DETECTED Final   Candida tropicalis NOT DETECTED NOT DETECTED Final      Radiology Studies: Dg Chest 2 View  Result Date: 06/28/2017 CLINICAL DATA:  History of smoking and congestive heart failure. EXAM: CHEST - 2 VIEW COMPARISON:  05/08/2011; chest CT - 05/17/2012 FINDINGS: Grossly unchanged cardiac silhouette and mediastinal contours. Minimal bilateral infrahilar opacities are unchanged and favored to represent atelectasis. No new focal airspace opacities. No pleural effusion or pneumothorax. No evidence of edema. No acute osseous abnormalities. Stigmata of DISH within the thoracic spine. IMPRESSION: No acute cardiopulmonary disease. Electronically Signed   By: Sandi Mariscal M.D.   On: 06/28/2017 19:30   Dg Foot Complete Left  Result Date: 06/28/2017 CLINICAL DATA:  Necrosis involving the bilateral feet EXAM: LEFT FOOT - COMPLETE 3+ VIEW COMPARISON:  None. FINDINGS: There is absence of near the entirety of the fifth digit with only minimal amount of the base of the proximal phalanx remaining. No fracture or dislocation. Mild degenerative change involving the first MTP joint. No significant hallux valgus deformity. No erosions. Small plantar calcaneal spur.  Enthesopathic change involving the Achilles tendon insertion site. 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 Further evaluation with MRI could performed as indicated. Electronically Signed   By: Sandi Mariscal M.D.   On: 06/28/2017 19:35   Dg Foot Complete Right  Result Date: 06/28/2017 CLINICAL DATA:  Necrosis of the bilateral feet. EXAM: RIGHT FOOT COMPLETE - 3+ VIEW COMPARISON:  None. FINDINGS: No fracture or dislocation. There is an apparent ulcer involving weight-bearing surface about the head of the first metatarsal. Minimal amount of adjacent subcutaneous emphysema. No definite radiopaque foreign body. No discrete areas of osteolysis to suggest osteomyelitis. 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 Note is made of an os tibialis externum. Small plantar calcaneal spur. Minimal enthesopathic change involving the Achilles tendon insertion site. IMPRESSION: Suspected ulcer about the weight-bearing surface about the head of the first metatarsal with adjacent subcutaneous emphysema but without radiographic evidence of osteomyelitis. Further evaluation with MRI could be performed as clinically Electronically Signed   By: Sandi Mariscal M.D.   On: 06/28/2017 19:33     Scheduled Meds: . atorvastatin  40 mg Oral Daily  . carvedilol  6.25 mg Oral BID WC  . chlorhexidine  15 mL Mouth Rinse BID  . feeding supplement (PRO-STAT SUGAR FREE 64)  30 mL Oral BID  . insulin aspart  0-5 Units Subcutaneous QHS  . insulin aspart  0-9 Units Subcutaneous TID WC  . insulin aspart  3 Units Subcutaneous TID WC  . insulin glargine  10 Units Subcutaneous QHS  . mouth rinse  15 mL Mouth Rinse q12n4p  . multivitamin with minerals  1 tablet Oral Daily  . nicotine  14 mg Transdermal Daily  . potassium chloride  40 mEq Oral Once  . tamsulosin  0.4 mg Oral Daily   Continuous Infusions: . piperacillin-tazobactam (ZOSYN)  IV    . vancomycin       LOS: 2 days    Time Spent in minutes   30 minutes  Montie Swiderski D.O. on 06/30/2017 at 3:36 PM  Between 7am to 7pm - Pager - [phone]  After 7pm go to [email or web address] - password [email or web address]  And look for the night coverage person covering for me after hours  [facility] Office  [phone]

## 2017-06-30 NOTE — Progress Notes (Signed)
Cardiac Monitoring Event  Dysrhythmia: 17 beats of VTACH  Symptoms: None. Pt asleep in bed.   Level of Consciousness: When aroused, alert and coherent. Denies any pain.    Last set of vital signs taken:  Temp: 98 F (36.7 C)  Pulse Rate: 93  Resp: 20  BP: (!) 154/89  SpO2: 100 %  Name of MD Notified:  NP Craige Cotta, on-call for [facility].   Time MD Notified:  2242  Comments/Actions Taken:  New labs ordered. Continue to monitor.

## 2017-06-30 NOTE — Progress Notes (Signed)
Patient has been unable to void bladder scanned for greater than 200.Text paged Maren Reamer NP.

## 2017-06-30 NOTE — Progress Notes (Signed)
ABI's have been completed. Right 0.8 Left 0.79  06/30/17 10:51 AM Olen Cordial RVT

## 2017-06-30 NOTE — Progress Notes (Signed)
Inpatient Diabetes Program   AACE/ADA: New Consensus Statement on Inpatient Glycemic Control (2015)  Target Ranges:  Prepandial:   less than 140 mg/dL      Peak postprandial:   less than 180 mg/dL (1-2 hours)      Critically ill patients:  140 - 180 mg/dL   Lab Results  Component Value Date   GLUCAP 235 (H) 06/30/2017   HGBA1C 9.2 (H) 06/29/2017    Attempted to speak with patient again. Patient states "I don't want to talk to you now." I thanked patient for his time and left the room. Will try to see patient this admission if he is willing.  Thanks,  Christena Deem RN, MSN, BC-ADM, [facility] Inpatient Diabetes Coordinator Team Pager [phone] (8a-5p)

## 2017-06-30 NOTE — Clinical Social Work Note (Signed)
Clinical Social Work Assessment  Patient Details  Name: Miguel Harvey MRN: [id number] Date of Birth: 1945-11-13  Date of referral:  06/30/17               Reason for consult:  Facility Placement(pending amputation 07/02/17)                Permission sought to share information with:  Facility Art therapist granted to share information::  Yes, Verbal Permission Granted  Name::        Agency::  SNFs  Relationship::     Contact Information:     Housing/Transportation Living arrangements for the past 2 months:  Single Family Home Source of Information:  Patient Patient Interpreter Needed:  None Criminal Activity/Legal Involvement Pertinent to Current Situation/Hospitalization:  No - Comment as needed Significant Relationships:  Adult Children Lives with:  Self Do you feel safe going back to the place where you live?  Yes Need for family participation in patient care:  No (Coment)  Care giving concerns: Patient from home independently. Per ortho, plan for right transtibial amputation on 07/02/17. CSW assessing in anticipation of SNF needs post-op.   Social Worker assessment / plan: CSW met with patient at bedside. Patient alert and oriented. CSW discussed planned surgery with patient. Patient expressed some sadness about the procedure, stating "I didn't think I was going to have to lose my leg." CSW expressed empathy for patient and acknowledged the significant change and adjustment patient will experience as a result of the amputation.  CSW discussed possible need for SNF pending the surgery. Patient is agreeable to short term SNF if needed. CSW will follow for medical readiness and PT evaluation and will support with disposition planning as needed.  Employment status:  Retired Research officer, political party) PT Recommendations:  Not assessed at this time Information / Referral to community resources:  [place]  Patient/Family's Response  to care: Patient appreciative of care.  Patient/Family's Understanding of and Emotional Response to Diagnosis, Current Treatment, and Prognosis: Patient with understanding of his condition and agreeable to SNF if indicated post-op.  Emotional Assessment Appearance:  Appears stated age Attitude/Demeanor/Rapport:  Engaged Affect (typically observed):  Calm, Appropriate Orientation:  Oriented to Self, Oriented to Place, Oriented to  Time, Oriented to Situation Alcohol / Substance use:  Not Applicable Psych involvement (Current and /or in the community):  No (Comment)  Discharge Needs  Concerns to be addressed:  Discharge Planning Concerns, Care Coordination Readmission within the last 30 days:  No Current discharge risk:  Physical Impairment, Lives alone Barriers to Discharge:  Continued Medical Work up   Estanislado Emms, LCSW 06/30/2017, 2:19 PM

## 2017-06-30 NOTE — Evaluation (Signed)
Physical Therapy Evaluation Patient Details Name: Miguel Harvey MRN: [id number] DOB: 09-14-1945 Today's Date: 06/30/2017   History of Present Illness  Miguel Harvey is a 72 y.o. male with medical history significant for combined systolic and diastolic CHF, diabetes, hypertension dyslipidemia, presented with an inability to walk, generalized weakness for past 2 days and a R forefoot ulceration with necrosis and foul-smelling dranage.  Possible R transmet amputation.  Clinical Impression  Pt admitted with/for weakness and R foot necrotic wound.  Pt not close to baseline function, needing mod assist in the least for basic mobility.  Pt currently limited functionally due to the problems listed. ( See problems list.)   Pt will benefit from PT to maximize function and safety in order to get ready for next venue listed below.     Follow Up Recommendations SNF;Supervision/Assistance - 24 hour    Equipment Recommendations  Rolling walker with 5" wheels    Recommendations for Other Services       Precautions / Restrictions Precautions Precautions: Fall Restrictions RLE Weight Bearing: Non weight bearing      Mobility  Bed Mobility Overal bed mobility: Needs Assistance Bed Mobility: Supine to Sit;Sit to Supine     Supine to sit: Mod assist Sit to supine: Mod assist   General bed mobility comments: cues for sequencing and initiation of movement, truncal assist up to sitting, truncal and legs assist to lay down.  Transfers Overall transfer level: Needs assistance   Transfers: Sit to/from Stand Sit to Stand: Mod assist;+2 safety/equipment         General transfer comment: cues for hand placement and asisst to both come forward and boost.  pt needed stability assist to balance even with RW  Ambulation/Gait             General Gait Details: not able today  Stairs            Wheelchair Mobility    Modified Rankin (Stroke Patients Only)       Balance Overall  balance assessment: Needs assistance Sitting-balance support: Single extremity supported;No upper extremity supported Sitting balance-Leahy Scale: Fair     Standing balance support: Bilateral upper extremity supported Standing balance-Leahy Scale: Poor                               Pertinent Vitals/Pain Pain Assessment: No/denies pain    Home Living Family/patient expects to be discharged to:: Private residence Living Arrangements: Alone Available Help at Discharge: Available PRN/intermittently;Family Type of Home: Mobile home Home Access: Stairs to enter Entrance Stairs-Rails: Doctor, general practice of Steps: several Home Layout: One level Home Equipment: None      Prior Function Level of Independence: Independent               Hand Dominance        Extremity/Trunk Assessment        Lower Extremity Assessment Lower Extremity Assessment: Generalized weakness(bil weak large muscle groups, L worse than R LE)    Cervical / Trunk Assessment Cervical / Trunk Assessment: Kyphotic  Communication   Communication: No difficulties  Cognition Arousal/Alertness: Awake/alert Behavior During Therapy: Flat affect Overall Cognitive Status: (not test formally.  slow to follow instruction)  General Comments General comments (skin integrity, edema, etc.): sats on RA maintained at mid 90's and HR after standing registered 63 bpm    Exercises     Assessment/Plan    PT Assessment Patient needs continued PT services  PT Problem List Decreased strength;Decreased activity tolerance;Decreased balance;Decreased mobility;Decreased knowledge of use of DME       PT Treatment Interventions DME instruction;Gait training;Functional mobility training;Therapeutic activities;Balance training;Patient/family education    PT Goals (Current goals can be found in the Care Plan section)  Acute Rehab PT  Goals Patient Stated Goal: pt didn't participate in goal setting PT Goal Formulation: Patient unable to participate in goal setting Time For Goal Achievement: 07/14/17 Potential to Achieve Goals: Fair    Frequency Min 3X/week   Barriers to discharge Decreased caregiver support      Co-evaluation               AM-PAC PT "6 Clicks" Daily Activity  Outcome Measure Difficulty turning over in bed (including adjusting bedclothes, sheets and blankets)?: Unable Difficulty moving from lying on back to sitting on the side of the bed? : Unable Difficulty sitting down on and standing up from a chair with arms (e.g., wheelchair, bedside commode, etc,.)?: Unable Help needed moving to and from a bed to chair (including a wheelchair)?: A Lot Help needed walking in hospital room?: A Lot Help needed climbing 3-5 steps with a railing? : Total 6 Click Score: 8    End of Session   Activity Tolerance: Patient limited by fatigue Patient left: in bed;with call bell/phone within reach;with bed alarm set;with family/visitor present Nurse Communication: Mobility status PT Visit Diagnosis: Unsteadiness on feet (R26.81);Muscle weakness (generalized) (M62.81);Other abnormalities of gait and mobility (R26.89)    Time: 1735-1810 PT Time Calculation (min) (ACUTE ONLY): 35 min   Charges:   PT Evaluation $PT Eval Moderate Complexity: 1 Mod PT Treatments $Therapeutic Activity: 8-22 mins   PT G Codes:        07-02-17  [name], PT [phone]  (pager)  Miguel Harvey 07/02/17, 6:26 PM

## 2017-06-30 NOTE — Progress Notes (Signed)
PHARMACY - PHYSICIAN COMMUNICATION CRITICAL VALUE ALERT - BLOOD CULTURE IDENTIFICATION (BCID)  Miguel Harvey is an 72 y.o. male who presented to [facility] on 06/28/2017 with a chief complaint of necrotic R foot.  Assessment:   72 yo M presents with weakness. Now with 1/4 blood cx positive with strep species. Does have foul smelling necrotic R foot ulceration which could be source of systemic infection, but more likely to be contaminant. Await further cx results. Orth plans to do R transtibial amputation on 4/5.  Name of physician (or Provider) Contacted: M. Mikhail  Current antibiotics: Vancomycin and Zosyn  Changes to prescribed antibiotics recommended:  No changes needed  Results for orders placed or performed during the hospital encounter of 06/28/17  Blood Culture ID Panel (Reflexed) (Collected: 06/28/2017  6:33 PM)  Result Value Ref Range   Enterococcus species NOT DETECTED NOT DETECTED   Listeria monocytogenes NOT DETECTED NOT DETECTED   Staphylococcus species NOT DETECTED NOT DETECTED   Staphylococcus aureus NOT DETECTED NOT DETECTED   Streptococcus species DETECTED (A) NOT DETECTED   Streptococcus agalactiae NOT DETECTED NOT DETECTED   Streptococcus pneumoniae NOT DETECTED NOT DETECTED   Streptococcus pyogenes NOT DETECTED NOT DETECTED   Acinetobacter baumannii NOT DETECTED NOT DETECTED   Enterobacteriaceae species NOT DETECTED NOT DETECTED   Enterobacter cloacae complex NOT DETECTED NOT DETECTED   Escherichia coli NOT DETECTED NOT DETECTED   Klebsiella oxytoca NOT DETECTED NOT DETECTED   Klebsiella pneumoniae NOT DETECTED NOT DETECTED   Proteus species NOT DETECTED NOT DETECTED   Serratia marcescens NOT DETECTED NOT DETECTED   Haemophilus influenzae NOT DETECTED NOT DETECTED   Neisseria meningitidis NOT DETECTED NOT DETECTED   Pseudomonas aeruginosa NOT DETECTED NOT DETECTED   Candida albicans NOT DETECTED NOT DETECTED   Candida glabrata NOT DETECTED NOT DETECTED   Candida krusei NOT DETECTED NOT DETECTED   Candida parapsilosis NOT DETECTED NOT DETECTED   Candida tropicalis NOT DETECTED NOT DETECTED   Enzo Bi, PharmD, BCPS Clinical Pharmacist Pager [phone] 06/30/2017 10:57 AM

## 2017-06-30 NOTE — Progress Notes (Signed)
Inpatient Diabetes Program Recommendations  AACE/ADA: New Consensus Statement on Inpatient Glycemic Control (2015)  Target Ranges:  Prepandial:   less than 140 mg/dL      Peak postprandial:   less than 180 mg/dL (1-2 hours)      Critically ill patients:  140 - 180 mg/dL   Lab Results  Component Value Date   GLUCAP 214 (H) 06/30/2017   HGBA1C 9.2 (H) 06/29/2017    Review of Glycemic Control  Diabetes history: DM 2 Outpatient Diabetes medications: Patient reports not taking anything Current orders for Inpatient glycemic control: Lantus 10 units, Novolog Sensitive Correction 0-9 units tid + Novolog HS scale 0-5 units + Novolog 3 units tid meal coverage  Inpatient Diabetes Program Recommendations:    Glucose in the 200 range. Consider increasing Lantus to 14 units.  Thanks,  Christena Deem RN, MSN, BC-ADM, [facility] Inpatient Diabetes Coordinator Team Pager [phone] (8a-5p)

## 2017-07-01 ENCOUNTER — Other Ambulatory Visit (INDEPENDENT_AMBULATORY_CARE_PROVIDER_SITE_OTHER): Payer: Self-pay | Admitting: Orthopedic Surgery

## 2017-07-01 DIAGNOSIS — M87074 Idiopathic aseptic necrosis of right foot: Secondary | ICD-10-CM

## 2017-07-01 LAB — BLOOD CULTURE ID PANEL (REFLEXED)
Acinetobacter baumannii: NOT DETECTED
CANDIDA ALBICANS: NOT DETECTED
CANDIDA GLABRATA: NOT DETECTED
CANDIDA KRUSEI: NOT DETECTED
CANDIDA PARAPSILOSIS: NOT DETECTED
CANDIDA TROPICALIS: NOT DETECTED
Carbapenem resistance: NOT DETECTED
ENTEROBACTER CLOACAE COMPLEX: NOT DETECTED
ESCHERICHIA COLI: NOT DETECTED
Enterobacteriaceae species: NOT DETECTED
Enterococcus species: NOT DETECTED
HAEMOPHILUS INFLUENZAE: NOT DETECTED
KLEBSIELLA PNEUMONIAE: NOT DETECTED
Klebsiella oxytoca: NOT DETECTED
LISTERIA MONOCYTOGENES: NOT DETECTED
Methicillin resistance: NOT DETECTED
Neisseria meningitidis: NOT DETECTED
PROTEUS SPECIES: NOT DETECTED
Pseudomonas aeruginosa: NOT DETECTED
SERRATIA MARCESCENS: NOT DETECTED
STREPTOCOCCUS PNEUMONIAE: NOT DETECTED
STREPTOCOCCUS PYOGENES: NOT DETECTED
Staphylococcus aureus (BCID): NOT DETECTED
Staphylococcus species: NOT DETECTED
Streptococcus agalactiae: NOT DETECTED
Streptococcus species: NOT DETECTED
Vancomycin resistance: NOT DETECTED

## 2017-07-01 LAB — BASIC METABOLIC PANEL
ANION GAP: 13 (ref 5–15)
BUN: 14 mg/dL (ref 6–20)
CHLORIDE: 94 mmol/L — AB (ref 101–111)
CO2: 25 mmol/L (ref 22–32)
Calcium: 8.2 mg/dL — ABNORMAL LOW (ref 8.9–10.3)
Creatinine, Ser: 0.97 mg/dL (ref 0.61–1.24)
GFR calc non Af Amer: >60 mL/min (ref >60)
Glucose, Bld: 200 mg/dL — ABNORMAL HIGH (ref 65–99)
Potassium: 4 mmol/L (ref 3.5–5.1)
SODIUM: 132 mmol/L — AB (ref 135–145)

## 2017-07-01 LAB — CBC
HCT: 38.6 % — ABNORMAL LOW (ref 39.0–52.0)
HEMOGLOBIN: 12.6 g/dL — AB (ref 13.0–17.0)
MCH: 28.1 pg (ref 26.0–34.0)
MCHC: 32.6 g/dL (ref 30.0–36.0)
MCV: 86.2 fL (ref 78.0–100.0)
Platelets: 279 10*3/uL (ref 150–400)
RBC: 4.48 MIL/uL (ref 4.22–5.81)
RDW: 14.2 % (ref 11.5–15.5)
WBC: 15.6 10*3/uL — AB (ref 4.0–10.5)

## 2017-07-01 LAB — SURGICAL PCR SCREEN
MRSA, PCR: NEGATIVE
Staphylococcus aureus: NEGATIVE

## 2017-07-01 LAB — GLUCOSE, CAPILLARY
GLUCOSE-CAPILLARY: 218 mg/dL — AB (ref 65–99)
GLUCOSE-CAPILLARY: 202 mg/dL — AB (ref 65–99)
GLUCOSE-CAPILLARY: 204 mg/dL — AB (ref 65–99)
Glucose-Capillary: 221 mg/dL — ABNORMAL HIGH (ref 65–99)

## 2017-07-01 LAB — MAGNESIUM: Magnesium: 2.1 mg/dL (ref 1.7–2.4)

## 2017-07-01 LAB — POTASSIUM: POTASSIUM: 4.2 mmol/L (ref 3.5–5.1)

## 2017-07-01 MED ORDER — CEFAZOLIN SODIUM-DEXTROSE 2-4 GM/100ML-% IV SOLN
2.0000 g | INTRAVENOUS | Status: AC
  Administered 2017-07-02: 2 g via INTRAVENOUS
  Filled 2017-07-01 (×2): qty 100

## 2017-07-01 MED ORDER — CHLORHEXIDINE GLUCONATE 4 % EX LIQD
60.0000 mL | Freq: Once | CUTANEOUS | Status: AC
  Administered 2017-07-02: 4 via TOPICAL
  Filled 2017-07-01: qty 60

## 2017-07-01 NOTE — Progress Notes (Addendum)
Spoke with patient in his room about his diabetes. States that he was diagnosed about 10 years ago. He has not seen a doctor in quite some time. States that he is on Metformin and Lantus at home. Not sure of dosages. Will need to follow up with PCP at discharge. Patient not compliant with taking his medicines, as quoted by the patient. States that he has a home blood glucose meter and strips at home, but only using them about once a month.  Will continue to monitor blood sugars while in the hospital.  Smith Mince RN BSN CDE Diabetes Coordinator Pager: [phone]  8am-5pm

## 2017-07-01 NOTE — Progress Notes (Signed)
Pharmacy Antibiotic Note  Miguel Harvey is a 72 y.o. male on day # 4 Vancomycin and Zosyn for bilateral foot infections with necrosis.  Strep viridans in 1 of 4 blood cultures, possible contaminant. Right BKA scheduled for 4/5. Afebrile, WBC down to 15.6. Renal function stable.  Plan:  Continue Vancomycin 1gm IV q12hrs.  Continue Zosyn 3.375 gm IV q8h (each over 4 hours).  Follow renal function, final culture data, progress.  Defer Vanc trough for now; will f/u for antibiotic plans post-op.  Target Vanc troughs 15-20 mcg/ml.  Height: 5\' 9"  (175.3 cm) Weight: 210 lb 15.7 oz (95.7 kg) IBW/kg (Calculated) : 70.7  Temp (24hrs), Avg:97.9 F (36.6 C), Min:97.6 F (36.4 C), Max:98.1 F (36.7 C)  Recent Labs  Lab 06/28/17 1816 06/28/17 1833 06/28/17 2106 06/29/17 0114 06/29/17 0356 06/30/17 0325 07/01/17 0030  WBC  --  19.2*  --   --  17.9*  --  15.6*  CREATININE  --  0.93  --   --  0.94 0.99 0.97  LATICACIDVEN 2.34*  --  1.69 1.6 1.8  --   --     Estimated Creatinine Clearance: 78.6 mL/min (by C-G formula based on SCr of 0.97 mg/dL).    No Known Allergies  Antimicrobials this admission:  Vancomycin 4/1>>  Zosyn 4/1>>  Dose adjustments this admission:  n/a  Microbiology results:  4/1 blood x 4 - Strep viridans in 1 of 2 cultures - possible contaminant, final result pending  4/1 MRSA PCR negative  Thank you for allowing pharmacy to be a part of this patient's care.  Dennie Fetters, [place] Pager: [phone] 07/01/2017 1:07 PM

## 2017-07-01 NOTE — H&P (View-Only) (Signed)
Patient ID: Miguel Harvey, male   DOB: 09/26/1945, 71 y.o.   MRN: [id number] Patient's ABIs show a flow of approximately 80% for both lower extremities.  We will plan for a right transtibial amputation tomorrow Friday.  I will call the patient's daughter to inform her of the recommendations. 

## 2017-07-01 NOTE — Progress Notes (Signed)
PROGRESS NOTE    Miguel Harvey  MCE:[id number] DOB: Sep 22, 1945 DOA: 06/28/2017 PCP: Carlyle Dolly, MD   Brief Narrative:  HPI On 06/28/2017 by Dr. Phillips Hay Miguel Harvey is a 72 y.o. male with medical history significant of combined systolic and diastolic CHF, diabetes, hypertension dyslipidemia   Presented with   inability to walk and generalized weakness for past 2 days reported to ER staff that he was dragging himself around on all fours around the house has not been taking his medications had significant nausea and vomiting.  He has known history of diabetes but have not been followed up with primary care provider Has had ongoing recurrent falls recently.  Reports 2 weeks ago he did have a URI which was associated mild cough seems to have now improved with 3 days ago became so weak he could not ambulate reported difficult to sensation bilateral lower extremities.  Patient continues to smoke. Denies any fevers. No significant pain sensation in lower feet.  Interim history Found to have diabetic foot infection with necrosis.  Orthopedic surgery consulted and appreciated, plan for surgery on 07/02/2017. Assessment & Plan   Sepsis secondary to right Diabetic foot infection with necrosis -Presented with hypotension, tachycardia, leukocytosis with elevated lactic acid -X-ray of the foot showed suspected ulcer above the head of the first metatarsal with adjacent subcutaneous emphysema, no radiographical evidence of osteomyelitis -Blood cultures 1/2 Strep viridans  -ABI: Right 0.8, Left 0.79 -Patient with elevated ESR and CRP -Orthopedics consulted and appreciated, plan for surgery on 07/02/2017.  Dr. Sharol Given does not feel MRI needed at this time -Continue vancomycin and Zosyn  Chronic combined systolic and diastolic heart failure -Last echocardiogram 2013 showed an EF of 40-45%, global hypokinesis -Appears to be euvolemic -Echocardiogram EF 44%, grade 2 diastolic dysfunction -Need  to monitor intake and output, daily weights  NSVT -had 6 beat run of NSVT on 06/30/2017 -vitals stable, asymptomatic  -Potassium replaced, now 4; magnesium 2.1 -echocardiogram as above  Hypokalemia -resolved with replacement -continue to monitor BMP  Diabetes mellitus, type II -Hemoglobin A1c 9.2 -Metformin held -Continue Lantus, insulin sliding scale, CBG monitoring  Essential hypertension -Continue Coreg, and IV hydralazine as needed  Dyslipidemia -Continue statin  Elevated troponin -Suspect secondary to demand ischemia and sepsis -No complaints of chest pain  Tobacco abuse -Smoking cessation discussed, continue nicotine patch  DVT Prophylaxis  SCDs  Code Status: Full  Family Communication: None at bedside  Disposition Plan: Admitted. Pending surgery on 07/02/2017  Consultants Orthopedics, Dr. Sharol Given  Procedures  Echocardiogram ABI  Antibiotics   Anti-infectives (From admission, onward)   Start     Dose/Rate Route Frequency Ordered Stop   06/30/17 2300  vancomycin (VANCOCIN) IVPB 1000 mg/200 mL premix     1,000 mg 200 mL/hr over 60 Minutes Intravenous Every 12 hours 06/30/17 1249     06/30/17 2000  piperacillin-tazobactam (ZOSYN) IVPB 3.375 g     3.375 g 12.5 mL/hr over 240 Minutes Intravenous Every 8 hours 06/30/17 1249     06/29/17 0800  vancomycin (VANCOCIN) IVPB 1000 mg/200 mL premix  Status:  Discontinued     1,000 mg 200 mL/hr over 60 Minutes Intravenous Every 12 hours 06/29/17 0048 06/30/17 1249   06/29/17 0200  piperacillin-tazobactam (ZOSYN) IVPB 3.375 g  Status:  Discontinued     3.375 g 12.5 mL/hr over 240 Minutes Intravenous Every 8 hours 06/28/17 2202 06/30/17 1249   06/29/17 0115  piperacillin-tazobactam (ZOSYN) IVPB 3.375 g  Status:  Discontinued  3.375 g 100 mL/hr over 30 Minutes Intravenous  Once 06/29/17 0101 06/29/17 0103   06/28/17 2115  vancomycin (VANCOCIN) IVPB 1000 mg/200 mL premix     1,000 mg 200 mL/hr over 60 Minutes  Intravenous  Once 06/28/17 2112 06/28/17 2324   06/28/17 1830  piperacillin-tazobactam (ZOSYN) IVPB 3.375 g     3.375 g 100 mL/hr over 30 Minutes Intravenous  Once 06/28/17 1825 06/28/17 1910      Subjective:   Miguel Harvey seen and examined today.  Denies chest pain, shortness of breath, abdominal pain, nausea, vomiting, diarrhea, constipation.   Objective:   Vitals:   06/30/17 0538 06/30/17 1428 06/30/17 2024 07/01/17 0442  BP: 134/85 (!) 134/95 (!) 154/89 (!) 134/94  Pulse: 85 93 93 69  Resp: 13 (!) 27 20 15   Temp: 97.7 F (36.5 C) 98.1 F (36.7 C) 98 F (36.7 C) 97.6 F (36.4 C)  TempSrc: Oral Oral Axillary Oral  SpO2: 99% 97% 100% 100%  Weight: 95.6 kg (210 lb 12.2 oz)   95.7 kg (210 lb 15.7 oz)  Height:        Intake/Output Summary (Last 24 hours) at 07/01/2017 1341 Last data filed at 07/01/2017 0910 Gross per 24 hour  Intake 970 ml  Output 1050 ml  Net -80 ml   Filed Weights   06/29/17 0129 06/30/17 0538 07/01/17 0442  Weight: 94.7 kg (208 lb 12.4 oz) 95.6 kg (210 lb 12.2 oz) 95.7 kg (210 lb 15.7 oz)   Exam  General: Well developed, well nourished, NAD, appears stated age  HEENT: NCAT, mucous membranes moist.   Neck: Supple  Cardiovascular: S1 S2 auscultated, no rubs, murmurs or gallops. Regular rate and rhythm.  Respiratory: Clear to auscultation bilaterally with equal chest rise  Abdomen: Soft, nontender, nondistended, + bowel sounds  Extremities: warm dry without cyanosis clubbing or edema. Right foot with ulcer/necrosis. Left foot ulcer- first toe  Neuro: AAOx3, nonfocal  Psych: Normal affect and demeanor with intact judgement and insight  Data Reviewed: I have personally reviewed following labs and imaging studies  CBC: Recent Labs  Lab 06/28/17 1833 06/29/17 0356 07/01/17 0030  WBC 19.2* 17.9* 15.6*  NEUTROABS 16.7*  --   --   HGB 13.3 12.5* 12.6*  HCT 38.8* 38.0* 38.6*  MCV 85.8 85.0 86.2  PLT 247 247 268   Basic Metabolic  Panel: Recent Labs  Lab 06/28/17 1833 06/29/17 0356 06/30/17 0325 06/30/17 2301 07/01/17 0030  NA 134* 134* 134*  --  132*  K 4.1 3.5 3.0* 4.2 4.0  CL 93* 96* 93*  --  94*  CO2 21* 23 26  --  25  GLUCOSE 277* 236* 222*  --  200*  BUN 21* 15 10  --  14  CREATININE 0.93 0.94 0.99  --  0.97  CALCIUM 8.2* 8.2* 7.9*  --  8.2*  MG  --  2.0 2.1 2.1  --   PHOS  --  2.9  --   --   --    GFR: Estimated Creatinine Clearance: 78.6 mL/min (by C-G formula based on SCr of 0.97 mg/dL). Liver Function Tests: Recent Labs  Lab 06/28/17 1833 06/29/17 0356  AST 46* 35  ALT 41 36  ALKPHOS 86 80  BILITOT 2.1* 1.5*  PROT 6.6 6.0*  ALBUMIN 2.5* 2.2*   No results for input(s): LIPASE, AMYLASE in the last 168 hours. No results for input(s): AMMONIA in the last 168 hours. Coagulation Profile: Recent Labs  Lab 06/29/17 0114  INR 1.28   Cardiac Enzymes: Recent Labs  Lab 06/28/17 1833 06/28/17 2325  TROPONINI 0.04* 0.05*   BNP (last 3 results) No results for input(s): PROBNP in the last 8760 hours. HbA1C: Recent Labs    06/29/17 0120  HGBA1C 9.2*   CBG: Recent Labs  Lab 06/30/17 1110 06/30/17 1612 06/30/17 2155 07/01/17 0802 07/01/17 1147  GLUCAP 235* 209* 194* 218* 202*   Lipid Profile: No results for input(s): CHOL, HDL, LDLCALC, TRIG, CHOLHDL, LDLDIRECT in the last 72 hours. Thyroid Function Tests: Recent Labs    06/29/17 0356  TSH 3.023   Anemia Panel: No results for input(s): VITAMINB12, FOLATE, FERRITIN, TIBC, IRON, RETICCTPCT in the last 72 hours. Urine analysis:    Component Value Date/Time   COLORURINE AMBER (A) 06/28/2017 1833   APPEARANCEUR CLEAR 06/28/2017 1833   LABSPEC 1.021 06/28/2017 1833   PHURINE 5.0 06/28/2017 1833   GLUCOSEU >=500 (A) 06/28/2017 1833   HGBUR SMALL (A) 06/28/2017 1833   BILIRUBINUR NEGATIVE 06/28/2017 1833   KETONESUR 20 (A) 06/28/2017 1833   PROTEINUR 30 (A) 06/28/2017 1833   UROBILINOGEN 0.2 05/07/2011 1813   NITRITE  NEGATIVE 06/28/2017 1833   LEUKOCYTESUR NEGATIVE 06/28/2017 1833   Sepsis Labs: @LABRCNTIP (procalcitonin:4,lacticidven:4)  ) Recent Results (from the past 240 hour(s))  MRSA PCR Screening     Status: None   Collection Time: 06/28/17  2:59 AM  Result Value Ref Range Status   MRSA by PCR NEGATIVE NEGATIVE Final    Comment:        The GeneXpert MRSA Assay (FDA approved for NASAL specimens only), is one component of a comprehensive MRSA colonization surveillance program. It is not intended to diagnose MRSA infection nor to guide or monitor treatment for MRSA infections. Performed at [facility], [place] [address], [place], [place] [address]   Blood Culture (routine x 2)     Status: None (Preliminary result)   Collection Time: 06/28/17  6:33 PM  Result Value Ref Range Status   Specimen Description   Final    BLOOD Performed at [facility] [address] [place], [place] [address]    Special Requests   Final    BOTTLES DRAWN AEROBIC AND ANAEROBIC Blood Culture adequate volume   Culture  Setup Time   Final    GRAM POSITIVE RODS ANAEROBIC BOTTLE ONLY CRITICAL RESULT CALLED TO, READ BACK BY AND VERIFIED WITH: J LEDFORD PHARMD 07/01/17 0001 JDW    Culture   Final    TOO YOUNG TO READ Performed at [facility], [place] [address], [place], [place] [address]    Report Status PENDING  Incomplete  Blood Culture (routine x 2)     Status: Abnormal (Preliminary result)   Collection Time: 06/28/17  6:33 PM  Result Value Ref Range Status   Specimen Description   Final    BLOOD BLOOD LEFT ARM Performed at [facility] [address], [place], [place] [address]    Special Requests   Final    BOTTLES DRAWN AEROBIC AND ANAEROBIC Blood Culture adequate volume Performed at [facility] [address] [place], [place] [address]    Culture  Setup Time   Final    GRAM POSITIVE COCCI IN CHAINS AEROBIC BOTTLE ONLY CRITICAL  RESULT CALLED TO, READ BACK BY AND VERIFIED WITH: Karlene Einstein NUUVOZ, AT 3664 06/30/17 BY D. VANHOOK Performed at [facility], [place] [address], [place], [place] [address]  Culture VIRIDANS STREPTOCOCCUS (A)  Final   Report Status PENDING  Incomplete  Blood Culture ID Panel (Reflexed)     Status: Abnormal   Collection Time: 06/28/17  6:33 PM  Result Value Ref Range Status   Enterococcus species NOT DETECTED NOT DETECTED Final   Listeria monocytogenes NOT DETECTED NOT DETECTED Final   Staphylococcus species NOT DETECTED NOT DETECTED Final   Staphylococcus aureus NOT DETECTED NOT DETECTED Final   Streptococcus species DETECTED (A) NOT DETECTED Final    Comment: Not Enterococcus species, Streptococcus agalactiae, Streptococcus pyogenes, or Streptococcus pneumoniae. CRITICAL RESULT CALLED TO, READ BACK BY AND VERIFIED WITH: N. BATCHELDER PHARMD, AT 0174 06/30/17 BY D. VANHOOK    Streptococcus agalactiae NOT DETECTED NOT DETECTED Final   Streptococcus pneumoniae NOT DETECTED NOT DETECTED Final   Streptococcus pyogenes NOT DETECTED NOT DETECTED Final   Acinetobacter baumannii NOT DETECTED NOT DETECTED Final   Enterobacteriaceae species NOT DETECTED NOT DETECTED Final   Enterobacter cloacae complex NOT DETECTED NOT DETECTED Final   Escherichia coli NOT DETECTED NOT DETECTED Final   Klebsiella oxytoca NOT DETECTED NOT DETECTED Final   Klebsiella pneumoniae NOT DETECTED NOT DETECTED Final   Proteus species NOT DETECTED NOT DETECTED Final   Serratia marcescens NOT DETECTED NOT DETECTED Final   Haemophilus influenzae NOT DETECTED NOT DETECTED Final   Neisseria meningitidis NOT DETECTED NOT DETECTED Final   Pseudomonas aeruginosa NOT DETECTED NOT DETECTED Final   Candida albicans NOT DETECTED NOT DETECTED Final   Candida glabrata NOT DETECTED NOT DETECTED Final   Candida krusei NOT DETECTED NOT DETECTED Final   Candida parapsilosis NOT DETECTED NOT DETECTED Final   Candida tropicalis NOT  DETECTED NOT DETECTED Final  Blood Culture ID Panel (Reflexed)     Status: None   Collection Time: 06/28/17  6:33 PM  Result Value Ref Range Status   Enterococcus species NOT DETECTED NOT DETECTED Final   Vancomycin resistance NOT DETECTED NOT DETECTED Final   Listeria monocytogenes NOT DETECTED NOT DETECTED Final   Staphylococcus species NOT DETECTED NOT DETECTED Final   Staphylococcus aureus NOT DETECTED NOT DETECTED Final   Methicillin resistance NOT DETECTED NOT DETECTED Final   Streptococcus species NOT DETECTED NOT DETECTED Final   Streptococcus agalactiae NOT DETECTED NOT DETECTED Final    Comment: CRITICAL RESULT CALLED TO, READ BACK BY AND VERIFIED WITH: J LEDFORD PHARMD 07/01/17 0001 JDW    Streptococcus pneumoniae NOT DETECTED NOT DETECTED Final   Streptococcus pyogenes NOT DETECTED NOT DETECTED Final   Acinetobacter baumannii NOT DETECTED NOT DETECTED Final   Enterobacteriaceae species NOT DETECTED NOT DETECTED Final   Enterobacter cloacae complex NOT DETECTED NOT DETECTED Final   Escherichia coli NOT DETECTED NOT DETECTED Final   Klebsiella oxytoca NOT DETECTED NOT DETECTED Final   Klebsiella pneumoniae NOT DETECTED NOT DETECTED Final   Proteus species NOT DETECTED NOT DETECTED Final   Serratia marcescens NOT DETECTED NOT DETECTED Final   Carbapenem resistance NOT DETECTED NOT DETECTED Final   Haemophilus influenzae NOT DETECTED NOT DETECTED Final   Neisseria meningitidis NOT DETECTED NOT DETECTED Final   Pseudomonas aeruginosa NOT DETECTED NOT DETECTED Final   Candida albicans NOT DETECTED NOT DETECTED Final   Candida glabrata NOT DETECTED NOT DETECTED Final   Candida krusei NOT DETECTED NOT DETECTED Final   Candida parapsilosis NOT DETECTED NOT DETECTED Final   Candida tropicalis NOT DETECTED NOT DETECTED Final    Comment: Performed at [facility], [address], [place], [place] [address]  Radiology Studies: No results found.   Scheduled Meds: .  atorvastatin  40 mg Oral Daily  . carvedilol  6.25 mg Oral BID WC  . chlorhexidine  15 mL Mouth Rinse BID  . feeding supplement (PRO-STAT SUGAR FREE 64)  30 mL Oral BID  . insulin aspart  0-5 Units Subcutaneous QHS  . insulin aspart  0-9 Units Subcutaneous TID WC  . insulin aspart  3 Units Subcutaneous TID WC  . insulin glargine  10 Units Subcutaneous QHS  . mouth rinse  15 mL Mouth Rinse q12n4p  . multivitamin with minerals  1 tablet Oral Daily  . nicotine  14 mg Transdermal Daily  . tamsulosin  0.4 mg Oral Daily   Continuous Infusions: . piperacillin-tazobactam (ZOSYN)  IV 3.375 g (07/01/17 1240)  . vancomycin Stopped (07/01/17 1215)     LOS: 3 days   Time Spent in minutes   30 minutes  Kajol Crispen D.O. on 07/01/2017 at 1:41 PM  Between 7am to 7pm - Pager - [phone]  After 7pm go to [email or web address] - password [email or web address]  And look for the night coverage person covering for me after hours  [facility] Office  [phone]

## 2017-07-01 NOTE — Progress Notes (Signed)
Patient ID: Miguel Harvey, male   DOB: November 27, 1945, 72 y.o.   MRN: [id number] Patient's ABIs show a flow of approximately 80% for both lower extremities.  We will plan for a right transtibial amputation tomorrow Friday.  I will call the patient's daughter to inform her of the recommendations.

## 2017-07-01 NOTE — Progress Notes (Signed)
PHARMACY - PHYSICIAN COMMUNICATION CRITICAL VALUE ALERT - BLOOD CULTURE IDENTIFICATION (BCID)  Miguel Harvey is an 72 y.o. male who presented to [facility] on 06/28/2017 with a chief complaint of foot ulcer   Name of physician (or Provider) Contacted: Donnamarie Poag ([facility])  Current antibiotics: Vancomycin/Zosyn  Changes to prescribed antibiotics recommended:  No changes  Results for orders placed or performed during the hospital encounter of 06/28/17  Blood Culture ID Panel (Reflexed) (Collected: 06/28/2017  6:33 PM)  Result Value Ref Range   Enterococcus species NOT DETECTED NOT DETECTED   Listeria monocytogenes NOT DETECTED NOT DETECTED   Staphylococcus species NOT DETECTED NOT DETECTED   Staphylococcus aureus NOT DETECTED NOT DETECTED   Streptococcus species DETECTED (A) NOT DETECTED   Streptococcus agalactiae NOT DETECTED NOT DETECTED   Streptococcus pneumoniae NOT DETECTED NOT DETECTED   Streptococcus pyogenes NOT DETECTED NOT DETECTED   Acinetobacter baumannii NOT DETECTED NOT DETECTED   Enterobacteriaceae species NOT DETECTED NOT DETECTED   Enterobacter cloacae complex NOT DETECTED NOT DETECTED   Escherichia coli NOT DETECTED NOT DETECTED   Klebsiella oxytoca NOT DETECTED NOT DETECTED   Klebsiella pneumoniae NOT DETECTED NOT DETECTED   Proteus species NOT DETECTED NOT DETECTED   Serratia marcescens NOT DETECTED NOT DETECTED   Haemophilus influenzae NOT DETECTED NOT DETECTED   Neisseria meningitidis NOT DETECTED NOT DETECTED   Pseudomonas aeruginosa NOT DETECTED NOT DETECTED   Candida albicans NOT DETECTED NOT DETECTED   Candida glabrata NOT DETECTED NOT DETECTED   Candida krusei NOT DETECTED NOT DETECTED   Candida parapsilosis NOT DETECTED NOT DETECTED   Candida tropicalis NOT DETECTED NOT DETECTED    Abran Duke 07/01/2017  12:33 AM

## 2017-07-01 NOTE — Progress Notes (Signed)
A/Ox4.  Had 6 beats run of PVC a few minutes ago.  Pt asymptomatic.  Will continue to monitor.  Hinton Dyer, RN

## 2017-07-02 ENCOUNTER — Inpatient Hospital Stay (HOSPITAL_COMMUNITY): Payer: Medicare Other | Admitting: Certified Registered"

## 2017-07-02 ENCOUNTER — Inpatient Hospital Stay (HOSPITAL_COMMUNITY): Payer: Medicare Other

## 2017-07-02 ENCOUNTER — Encounter (HOSPITAL_COMMUNITY)
Admission: EM | Disposition: A | Discharge status: Skilled Nursing Facility | Payer: Self-pay | Source: Home / Self Care | Attending: Family Medicine

## 2017-07-02 DIAGNOSIS — E1152 Type 2 diabetes mellitus with diabetic peripheral angiopathy with gangrene: Secondary | ICD-10-CM

## 2017-07-02 DIAGNOSIS — L02611 Cutaneous abscess of right foot: Secondary | ICD-10-CM

## 2017-07-02 DIAGNOSIS — M87074 Idiopathic aseptic necrosis of right foot: Secondary | ICD-10-CM

## 2017-07-02 DIAGNOSIS — G92 Toxic encephalopathy: Secondary | ICD-10-CM

## 2017-07-02 HISTORY — PX: AMPUTATION: SHX166

## 2017-07-02 LAB — BASIC METABOLIC PANEL
Anion gap: 12 (ref 5–15)
Anion gap: 14 (ref 5–15)
BUN: 20 mg/dL (ref 6–20)
BUN: 22 mg/dL — AB (ref 6–20)
CO2: 25 mmol/L (ref 22–32)
CO2: 22 mmol/L (ref 22–32)
CREATININE: 1.26 mg/dL — AB (ref 0.61–1.24)
Calcium: 8 mg/dL — ABNORMAL LOW (ref 8.9–10.3)
Calcium: 7.8 mg/dL — ABNORMAL LOW (ref 8.9–10.3)
Chloride: 93 mmol/L — ABNORMAL LOW (ref 101–111)
Chloride: 97 mmol/L — ABNORMAL LOW (ref 101–111)
Creatinine, Ser: 1.13 mg/dL (ref 0.61–1.24)
GFR calc Af Amer: >60 mL/min (ref >60)
GFR calc non Af Amer: >60 mL/min (ref >60)
GFR, EST NON AFRICAN AMERICAN: 55 mL/min — AB (ref >60)
GLUCOSE: 223 mg/dL — AB (ref 65–99)
Glucose, Bld: 182 mg/dL — ABNORMAL HIGH (ref 65–99)
POTASSIUM: 3.7 mmol/L (ref 3.5–5.1)
POTASSIUM: 4.6 mmol/L (ref 3.5–5.1)
SODIUM: 130 mmol/L — AB (ref 135–145)
Sodium: 133 mmol/L — ABNORMAL LOW (ref 135–145)

## 2017-07-02 LAB — CBC
HCT: 35.9 % — ABNORMAL LOW (ref 39.0–52.0)
HEMOGLOBIN: 11.8 g/dL — AB (ref 13.0–17.0)
MCH: 27.9 pg (ref 26.0–34.0)
MCHC: 32.9 g/dL (ref 30.0–36.0)
MCV: 84.9 fL (ref 78.0–100.0)
Platelets: 258 10*3/uL (ref 150–400)
RBC: 4.23 MIL/uL (ref 4.22–5.81)
RDW: 13.8 % (ref 11.5–15.5)
WBC: 15.5 10*3/uL — ABNORMAL HIGH (ref 4.0–10.5)

## 2017-07-02 LAB — BLOOD GAS, ARTERIAL
ACID-BASE EXCESS: 1.5 mmol/L (ref 0.0–2.0)
Bicarbonate: 24.5 mmol/L (ref 20.0–28.0)
DRAWN BY: 518061
FIO2: 100
O2 SAT: 99.8 %
PCO2 ART: 31.9 mmHg — AB (ref 32.0–48.0)
PEEP/CPAP: 5 cmH2O
PH ART: 7.498 — AB (ref 7.350–7.450)
Patient temperature: 98.6
RATE: 15 resp/min
VT: 570 mL
pO2, Arterial: 493 mmHg — ABNORMAL HIGH (ref 83.0–108.0)

## 2017-07-02 LAB — CBC WITH DIFFERENTIAL/PLATELET
BASOS ABS: 0 10*3/uL (ref 0.0–0.1)
Basophils Relative: 0 %
EOS ABS: 0 10*3/uL (ref 0.0–0.7)
EOS PCT: 0 %
HCT: 36.1 % — ABNORMAL LOW (ref 39.0–52.0)
Hemoglobin: 11.8 g/dL — ABNORMAL LOW (ref 13.0–17.0)
LYMPHS PCT: 9 %
Lymphs Abs: 1.5 10*3/uL (ref 0.7–4.0)
MCH: 27.6 pg (ref 26.0–34.0)
MCHC: 32.7 g/dL (ref 30.0–36.0)
MCV: 84.3 fL (ref 78.0–100.0)
MONO ABS: 1.1 10*3/uL — AB (ref 0.1–1.0)
Monocytes Relative: 7 %
Neutro Abs: 14.4 10*3/uL — ABNORMAL HIGH (ref 1.7–7.7)
Neutrophils Relative %: 84 %
Platelets: 242 10*3/uL (ref 150–400)
RBC: 4.28 MIL/uL (ref 4.22–5.81)
RDW: 13.9 % (ref 11.5–15.5)
WBC: 17.1 10*3/uL — ABNORMAL HIGH (ref 4.0–10.5)

## 2017-07-02 LAB — PHOSPHORUS
PHOSPHORUS: 4 mg/dL (ref 2.5–4.6)
Phosphorus: 4.2 mg/dL (ref 2.5–4.6)

## 2017-07-02 LAB — GLUCOSE, CAPILLARY
GLUCOSE-CAPILLARY: 174 mg/dL — AB (ref 65–99)
GLUCOSE-CAPILLARY: 199 mg/dL — AB (ref 65–99)
GLUCOSE-CAPILLARY: 181 mg/dL — AB (ref 65–99)
GLUCOSE-CAPILLARY: 219 mg/dL — AB (ref 65–99)
GLUCOSE-CAPILLARY: 195 mg/dL — AB (ref 65–99)

## 2017-07-02 LAB — TSH: TSH: 8.407 u[IU]/mL — ABNORMAL HIGH (ref 0.350–4.500)

## 2017-07-02 LAB — AMMONIA: Ammonia: 27 umol/L (ref 9–35)

## 2017-07-02 SURGERY — AMPUTATION BELOW KNEE
Anesthesia: Choice | Laterality: Right

## 2017-07-02 MED ORDER — ORAL CARE MOUTH RINSE
15.0000 mL | OROMUCOSAL | Status: DC
  Administered 2017-07-03 – 2017-07-05 (×28): 15 mL via OROMUCOSAL

## 2017-07-02 MED ORDER — FENTANYL CITRATE (PF) 250 MCG/5ML IJ SOLN
INTRAMUSCULAR | Status: DC | PRN
  Administered 2017-07-02: 125 ug via INTRAVENOUS
  Administered 2017-07-02: 25 ug via INTRAVENOUS

## 2017-07-02 MED ORDER — SODIUM CHLORIDE 0.9 % IV SOLN: INTRAVENOUS | Status: DC

## 2017-07-02 MED ORDER — POLYETHYLENE GLYCOL 3350 17 G PO PACK
17.0000 g | PACK | Freq: Every day | ORAL | Status: DC | PRN
  Filled 2017-07-02: qty 1

## 2017-07-02 MED ORDER — DOCUSATE SODIUM 100 MG PO CAPS
100.0000 mg | ORAL_CAPSULE | Freq: Two times a day (BID) | ORAL | Status: DC
  Filled 2017-07-02 (×2): qty 1

## 2017-07-02 MED ORDER — FENTANYL CITRATE (PF) 250 MCG/5ML IJ SOLN
INTRAMUSCULAR | Status: AC
  Filled 2017-07-02: qty 5

## 2017-07-02 MED ORDER — EPINEPHRINE PF 1 MG/10ML IJ SOSY
PREFILLED_SYRINGE | INTRAMUSCULAR | Status: AC
  Filled 2017-07-02: qty 10

## 2017-07-02 MED ORDER — CEFTRIAXONE SODIUM 2 G IJ SOLR
2.0000 g | INTRAMUSCULAR | Status: DC
Start: 2017-07-02 — End: 2017-07-08
  Administered 2017-07-02 – 2017-07-07 (×6): 2 g via INTRAVENOUS
  Filled 2017-07-02 (×7): qty 20

## 2017-07-02 MED ORDER — PHENYLEPHRINE HCL 10 MG/ML IJ SOLN
INTRAVENOUS | Status: DC | PRN
  Administered 2017-07-02: 50 ug/min via INTRAVENOUS

## 2017-07-02 MED ORDER — 0.9 % SODIUM CHLORIDE (POUR BTL) OPTIME
TOPICAL | Status: DC | PRN
  Administered 2017-07-02: 1000 mL

## 2017-07-02 MED ORDER — INSULIN ASPART 100 UNIT/ML ~~LOC~~ SOLN
2.0000 [IU] | SUBCUTANEOUS | Status: DC
  Administered 2017-07-02: 4 [IU] via SUBCUTANEOUS
  Administered 2017-07-03: 2 [IU] via SUBCUTANEOUS
  Administered 2017-07-03: 6 [IU] via SUBCUTANEOUS
  Administered 2017-07-03: 4 [IU] via SUBCUTANEOUS

## 2017-07-02 MED ORDER — METHOCARBAMOL 1000 MG/10ML IJ SOLN
500.0000 mg | Freq: Four times a day (QID) | INTRAVENOUS | Status: DC | PRN
  Filled 2017-07-02: qty 5

## 2017-07-02 MED ORDER — EPINEPHRINE PF 1 MG/10ML IJ SOSY
PREFILLED_SYRINGE | INTRAMUSCULAR | Status: DC | PRN
  Administered 2017-07-02: .2 mg via INTRAVENOUS
  Administered 2017-07-02: .5 mg via INTRAVENOUS
  Administered 2017-07-02: .3 mg via INTRAVENOUS

## 2017-07-02 MED ORDER — IOPAMIDOL (ISOVUE-300) INJECTION 61%
INTRAVENOUS | Status: AC
  Filled 2017-07-02: qty 100

## 2017-07-02 MED ORDER — PHENYLEPHRINE 40 MCG/ML (10ML) SYRINGE FOR IV PUSH (FOR BLOOD PRESSURE SUPPORT)
PREFILLED_SYRINGE | INTRAVENOUS | Status: DC | PRN
  Administered 2017-07-02: 200 ug via INTRAVENOUS
  Administered 2017-07-02: 80 ug via INTRAVENOUS
  Administered 2017-07-02 (×2): 160 ug via INTRAVENOUS

## 2017-07-02 MED ORDER — SODIUM CHLORIDE 0.9 % IV SOLN
INTRAVENOUS | Status: DC
  Administered 2017-07-02 – 2017-07-07 (×4): via INTRAVENOUS

## 2017-07-02 MED ORDER — PHENYLEPHRINE 40 MCG/ML (10ML) SYRINGE FOR IV PUSH (FOR BLOOD PRESSURE SUPPORT)
PREFILLED_SYRINGE | INTRAVENOUS | Status: AC
  Filled 2017-07-02: qty 10

## 2017-07-02 MED ORDER — METOCLOPRAMIDE HCL 5 MG/ML IJ SOLN: 5.0000 mg | Freq: Three times a day (TID) | INTRAMUSCULAR | Status: DC | PRN

## 2017-07-02 MED ORDER — VASOPRESSIN 20 UNIT/ML IV SOLN
INTRAVENOUS | Status: DC | PRN
  Administered 2017-07-02 (×2): 4 [IU] via INTRAVENOUS
  Administered 2017-07-02: 2 [IU] via INTRAVENOUS

## 2017-07-02 MED ORDER — PRO-STAT SUGAR FREE PO LIQD
30.0000 mL | Freq: Two times a day (BID) | ORAL | Status: DC
  Administered 2017-07-02 – 2017-07-03 (×2): 30 mL
  Filled 2017-07-02 (×2): qty 30

## 2017-07-02 MED ORDER — ENOXAPARIN SODIUM 40 MG/0.4ML ~~LOC~~ SOLN
40.0000 mg | SUBCUTANEOUS | Status: DC
  Administered 2017-07-03 – 2017-07-11 (×9): 40 mg via SUBCUTANEOUS
  Filled 2017-07-02 (×9): qty 0.4

## 2017-07-02 MED ORDER — BISACODYL 10 MG RE SUPP: 10.0000 mg | Freq: Every day | RECTAL | Status: DC | PRN

## 2017-07-02 MED ORDER — ONDANSETRON HCL 4 MG/2ML IJ SOLN: 4.0000 mg | Freq: Four times a day (QID) | INTRAMUSCULAR | Status: DC | PRN

## 2017-07-02 MED ORDER — LIDOCAINE HCL (CARDIAC) 20 MG/ML IV SOLN
INTRAVENOUS | Status: AC
  Filled 2017-07-02: qty 5

## 2017-07-02 MED ORDER — VASOPRESSIN 20 UNIT/ML IV SOLN
INTRAVENOUS | Status: AC
  Filled 2017-07-02: qty 1

## 2017-07-02 MED ORDER — LIDOCAINE 2% (20 MG/ML) 5 ML SYRINGE
INTRAMUSCULAR | Status: DC | PRN
  Administered 2017-07-02: 60 mg via INTRAVENOUS

## 2017-07-02 MED ORDER — ONDANSETRON HCL 4 MG/2ML IJ SOLN
INTRAMUSCULAR | Status: AC
  Filled 2017-07-02: qty 2

## 2017-07-02 MED ORDER — METOCLOPRAMIDE HCL 5 MG PO TABS: 5.0000 mg | ORAL_TABLET | Freq: Three times a day (TID) | ORAL | Status: DC | PRN

## 2017-07-02 MED ORDER — PROPOFOL 10 MG/ML IV BOLUS
INTRAVENOUS | Status: DC | PRN
  Administered 2017-07-02: 120 mg via INTRAVENOUS

## 2017-07-02 MED ORDER — ALBUTEROL SULFATE HFA 108 (90 BASE) MCG/ACT IN AERS
INHALATION_SPRAY | RESPIRATORY_TRACT | Status: AC
  Filled 2017-07-02: qty 6.7

## 2017-07-02 MED ORDER — VITAL HIGH PROTEIN PO LIQD
1000.0000 mL | ORAL | Status: DC
  Administered 2017-07-03: 1000 mL

## 2017-07-02 MED ORDER — ONDANSETRON HCL 4 MG PO TABS: 4.0000 mg | ORAL_TABLET | Freq: Four times a day (QID) | ORAL | Status: DC | PRN

## 2017-07-02 MED ORDER — IOPAMIDOL (ISOVUE-300) INJECTION 61%
100.0000 mL | Freq: Once | INTRAVENOUS | Status: AC | PRN
  Administered 2017-07-02: 100 mL via INTRAVENOUS

## 2017-07-02 MED ORDER — PROPOFOL 10 MG/ML IV BOLUS
INTRAVENOUS | Status: AC
  Filled 2017-07-02: qty 20

## 2017-07-02 MED ORDER — MAGNESIUM CITRATE PO SOLN: 1.0000 | Freq: Once | ORAL | Status: DC | PRN

## 2017-07-02 MED ORDER — EPHEDRINE SULFATE 50 MG/ML IJ SOLN
INTRAMUSCULAR | Status: DC | PRN
  Administered 2017-07-02: 10 mg via INTRAVENOUS

## 2017-07-02 MED ORDER — LACTATED RINGERS IV SOLN
INTRAVENOUS | Status: DC | PRN
  Administered 2017-07-02: 08:00:00 via INTRAVENOUS

## 2017-07-02 MED ORDER — LORAZEPAM 2 MG/ML IJ SOLN
1.0000 mg | Freq: Once | INTRAMUSCULAR | Status: AC
  Administered 2017-07-02: 1 mg via INTRAVENOUS
  Filled 2017-07-02 (×2): qty 1

## 2017-07-02 MED ORDER — POTASSIUM CHLORIDE 10 MEQ/100ML IV SOLN
10.0000 meq | INTRAVENOUS | Status: AC
  Administered 2017-07-02 – 2017-07-03 (×3): 10 meq via INTRAVENOUS
  Filled 2017-07-02 (×4): qty 100

## 2017-07-02 MED ORDER — METHOCARBAMOL 500 MG PO TABS
500.0000 mg | ORAL_TABLET | Freq: Four times a day (QID) | ORAL | Status: DC | PRN
  Administered 2017-07-09 – 2017-07-11 (×2): 500 mg via ORAL
  Filled 2017-07-02 (×2): qty 1

## 2017-07-02 SURGICAL SUPPLY — 32 items
BLADE SAW RECIP 87.9 MT (BLADE) ×3 IMPLANT
BLADE SURG 21 STRL SS (BLADE) ×3 IMPLANT
BNDG COHESIVE 6X5 TAN STRL LF (GAUZE/BANDAGES/DRESSINGS) ×6 IMPLANT
BNDG GAUZE ELAST 4 BULKY (GAUZE/BANDAGES/DRESSINGS) ×6 IMPLANT
COVER SURGICAL LIGHT HANDLE (MISCELLANEOUS) ×3 IMPLANT
CUFF TOURNIQUET SINGLE 34IN LL (TOURNIQUET CUFF) IMPLANT
CUFF TOURNIQUET SINGLE 44IN (TOURNIQUET CUFF) IMPLANT
DRAPE INCISE IOBAN 66X45 STRL (DRAPES) IMPLANT
DRAPE U-SHAPE 47X51 STRL (DRAPES) ×3 IMPLANT
DRESSING PREVENA PLUS CUSTOM (GAUZE/BANDAGES/DRESSINGS) ×1 IMPLANT
DRSG PREVENA PLUS CUSTOM (GAUZE/BANDAGES/DRESSINGS) ×3
ELECT REM PT RETURN 9FT ADLT (ELECTROSURGICAL) ×3
ELECTRODE REM PT RTRN 9FT ADLT (ELECTROSURGICAL) ×1 IMPLANT
GLOVE BIOGEL PI IND STRL 9 (GLOVE) ×1 IMPLANT
GLOVE BIOGEL PI INDICATOR 9 (GLOVE) ×2
GLOVE SURG ORTHO 9.0 STRL STRW (GLOVE) ×3 IMPLANT
GOWN STRL REUS W/ TWL XL LVL3 (GOWN DISPOSABLE) ×2 IMPLANT
GOWN STRL REUS W/TWL XL LVL3 (GOWN DISPOSABLE) ×6
KIT BASIN OR (CUSTOM PROCEDURE TRAY) ×3 IMPLANT
KIT DRSG PREVENA PLUS 7DAY 125 (MISCELLANEOUS) ×2 IMPLANT
KIT TURNOVER KIT B (KITS) ×3 IMPLANT
MANIFOLD NEPTUNE II (INSTRUMENTS) ×3 IMPLANT
NS IRRIG 1000ML POUR BTL (IV SOLUTION) ×3 IMPLANT
PACK ORTHO EXTREMITY (CUSTOM PROCEDURE TRAY) ×3 IMPLANT
PAD ARMBOARD 7.5X6 YLW CONV (MISCELLANEOUS) ×3 IMPLANT
SPONGE LAP 18X18 X RAY DECT (DISPOSABLE) IMPLANT
STAPLER VISISTAT 35W (STAPLE) IMPLANT
STOCKINETTE IMPERVIOUS LG (DRAPES) ×3 IMPLANT
SUT SILK 2 0 (SUTURE) ×3
SUT SILK 2-0 18XBRD TIE 12 (SUTURE) ×1 IMPLANT
SUT VIC AB 1 CTX 27 (SUTURE) IMPLANT
TOWEL OR 17X26 10 PK STRL BLUE (TOWEL DISPOSABLE) ×3 IMPLANT

## 2017-07-02 NOTE — Anesthesia Procedure Notes (Signed)
Procedure Name: Intubation Date/Time: 07/02/2017 8:59 AM Performed by: Freddie Breech, CRNA Pre-anesthesia Checklist: Patient identified, Emergency Drugs available, Suction available and Patient being monitored Patient Re-evaluated:Patient Re-evaluated prior to induction Oxygen Delivery Method: Circle System Utilized Preoxygenation: Pre-oxygenation with 100% oxygen Induction Type: IV induction Ventilation: Mask ventilation without difficulty Laryngoscope Size: Mac and 4 Tube type: Oral Tube size: 7.5 mm Number of attempts: 1 Airway Equipment and Method: Stylet and Oral airway Placement Confirmation: ETT inserted through vocal cords under direct vision,  positive ETCO2 and breath sounds checked- equal and bilateral Secured at: 23 cm Tube secured with: Tape Dental Injury: Teeth and Oropharynx as per pre-operative assessment

## 2017-07-02 NOTE — Progress Notes (Signed)
Anesthesiology: pt with hypotensive event in OR, responded to epinephrine (although required brief chest compressions to augment circulation). He was intubated for airway protection. He has remained intubated since, as is now only responsive to deep stimulation. He has lash, gag, cough, and withdrawal reflexes, but will not respond to voice.    Discussed with Dr. Lajoyce Corners and Dr. Catha Gosselin, who agree patient to remain intubated to ICU for post op care. Dr. Lajoyce Corners has discussed with patient's family.   Sandford Craze, MD   [phone]

## 2017-07-02 NOTE — Progress Notes (Signed)
Pt transported from PACU to 4N30 by RT on ventilator with no complications. VS stable.

## 2017-07-02 NOTE — Progress Notes (Signed)
Called the [facility] to see if it is ok to hold the wound vac for 40-45 min for patient's MRI. Spoke with the PA who said it was ok.

## 2017-07-02 NOTE — Anesthesia Preprocedure Evaluation (Signed)
Anesthesia Evaluation  Patient identified by MRN, date of birth, ID band Patient awake    Reviewed: Allergy & Precautions, NPO status , Patient's Chart, lab work & pertinent test results  History of Anesthesia Complications Negative for: history of anesthetic complications  Airway Mallampati: II  TM Distance: >3 FB Neck ROM: Full    Dental  (+) Edentulous Upper, Edentulous Lower   Pulmonary Current Smoker,    breath sounds clear to auscultation       Cardiovascular hypertension, Pt. on medications and Pt. on home beta blockers  Rhythm:Regular Rate:Normal     Neuro/Psych negative neurological ROS     GI/Hepatic negative GI ROS, Neg liver ROS,   Endo/Other  diabetes (glu 199), Oral Hypoglycemic Agents  Renal/GU negative Renal ROS     Musculoskeletal   Abdominal   Peds  Hematology negative hematology ROS (+)   Anesthesia Other Findings   Reproductive/Obstetrics                             Anesthesia Physical Anesthesia Plan  ASA: III  Anesthesia Plan: General   Post-op Pain Management:    Induction: Intravenous  PONV Risk Score and Plan: 2  Airway Management Planned: LMA  Additional Equipment:   Intra-op Plan:   Post-operative Plan:   Informed Consent: I have reviewed the patients History and Physical, chart, labs and discussed the procedure including the risks, benefits and alternatives for the proposed anesthesia with the patient or authorized representative who has indicated his/her understanding and acceptance.   Dental advisory given  Plan Discussed with: CRNA and Surgeon  Anesthesia Plan Comments: (Plan routine monitors, GA- LMA OK)        Anesthesia Quick Evaluation

## 2017-07-02 NOTE — Consult Note (Signed)
Neurology Consultation  Reason for Consult: persistent AMS, concern for subclinical seizures Referring Physician: Dr. Denese Killings  CC: AMS, concern for subclinical seizrues  History is obtained from: Chart, PCCM attending and later from daughter who came to bedside  HPI: Miguel Harvey is a 72 y.o. male PMH CHF, HTN and DM, admitted since 06/28/2017 for generalized weakness, inability to walk and dragging feet for few days prior to presentation, Was found to have a diabetic foot infection and sepsis on admission for which she was receiving treatment. 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  He continues to be obtunded, now around 7:30 PM when his surgery was earlier this morning.  He is currently intubated and in the ICU.  Upon initial evaluation by the ICU attending, he was noted to be hypotensive, unresponsive, with mild disconjugate gaze raising concern for a possible underlying electrographic abnormality. Neurological consultation was placed to assess for appropriateness for a stat EEG and further recommendations with the altered mental status.  I spoke to the patient's daughter at bedside.  She told me that the patient has been noncompliant on his medications.  He had an heart attack for 5 years ago, and was also diagnosed with diabetes then.  He told the family that he has been compliant with medication and seeing doctors but they later found out that he has not been compliant with follow-up or medications.  He does not have a history of seizure disorder.  He does not have a history of stroke in the past that the family is aware of.  LKW: Unclear, has been altered for at least a day or 2 tpa given?: no, outside the window Premorbid modified Rankin scale (mRS): 4  ROS: Unable to obtain due to altered mental status.   Past Medical History:  Diagnosis Date  . CHF (congestive heart failure) (HCC)   . Diabetes mellitus   .  Hypertension     Family History  Problem Relation Age of Onset  . CAD Other   . Diabetes Other     Social History:   reports that he has been smoking cigarettes.  He has a 80.00 pack-year smoking history. He quit smokeless tobacco use about 6 years ago. He reports that he drinks about 3.6 oz of alcohol per week. His drug history is not on file. Current smoker Denies illicit drug use Concurrent alcohol use  Medications  Current Facility-Administered Medications:  .  0.9 %  sodium chloride infusion, , Intravenous, Continuous, Nadara Mustard, MD .  0.9 %  sodium chloride infusion, , Intravenous, Continuous, Agarwala, Ravi, MD, Last Rate: 50 mL/hr at 07/02/17 1857 .  acetaminophen (TYLENOL) tablet 650 mg, 650 mg, Oral, Q6H PRN **OR** acetaminophen (TYLENOL) suppository 650 mg, 650 mg, Rectal, Q6H PRN, Nadara Mustard, MD .  atorvastatin (LIPITOR) tablet 40 mg, 40 mg, Oral, Daily, Nadara Mustard, MD, 40 mg at 07/01/17 [id number] .  bisacodyl (DULCOLAX) suppository 10 mg, 10 mg, Rectal, Daily PRN, Nadara Mustard, MD .  cefTRIAXone (ROCEPHIN) 2 g in sodium chloride 0.9 % 100 mL IVPB, 2 g, Intravenous, Q24H, Scarlett Presto, [facility] .  chlorhexidine (PERIDEX) 0.12 % solution 15 mL, 15 mL, Mouth Rinse, BID, Nadara Mustard, MD, 15 mL at 07/01/17 2321 .  docusate sodium (COLACE) capsule 100 mg, 100 mg, Oral, BID, Nadara Mustard, MD .  Melene Muller ON 07/03/2017] enoxaparin (LOVENOX) injection 40 mg, 40 mg, Subcutaneous, Q24H, Lynnell Catalan, MD .  feeding supplement (PRO-STAT SUGAR FREE 64) liquid 30 mL, 30 mL, Per Tube, BID, Agarwala, Ravi, MD .  feeding supplement (VITAL HIGH PROTEIN) liquid 1,000 mL, 1,000 mL, Per Tube, Q24H, Agarwala, Ravi, MD .  insulin aspart (novoLOG) injection 2-6 Units, 2-6 Units, Subcutaneous, Q4H, Agarwala, Ravi, MD .  insulin glargine (LANTUS) injection 10 Units, 10 Units, Subcutaneous, QHS, Nadara Mustard, MD, 10 Units at 07/01/17 2320 .  iopamidol (ISOVUE-300) 61 % injection, , , ,   .  magnesium citrate solution 1 Bottle, 1 Bottle, Oral, Once PRN, Nadara Mustard, MD .  MEDLINE mouth rinse, 15 mL, Mouth Rinse, q12n4p, Nadara Mustard, MD, 15 mL at 07/01/17 1647 .  methocarbamol (ROBAXIN) tablet 500 mg, 500 mg, Oral, Q6H PRN **OR** methocarbamol (ROBAXIN) 500 mg in dextrose 5 % 50 mL IVPB, 500 mg, Intravenous, Q6H PRN, Nadara Mustard, MD .  metoCLOPramide (REGLAN) tablet 5-10 mg, 5-10 mg, Oral, Q8H PRN **OR** metoCLOPramide (REGLAN) injection 5-10 mg, 5-10 mg, Intravenous, Q8H PRN, Nadara Mustard, MD .  multivitamin with minerals tablet 1 tablet, 1 tablet, Oral, Daily, Nadara Mustard, MD, 1 tablet at 07/01/17 [phone] .  nicotine (NICODERM CQ - dosed in mg/24 hours) patch 14 mg, 14 mg, Transdermal, Daily, Nadara Mustard, MD, 14 mg at 07/01/17 0836 .  ondansetron (ZOFRAN) tablet 4 mg, 4 mg, Oral, Q6H PRN **OR** ondansetron (ZOFRAN) injection 4 mg, 4 mg, Intravenous, Q6H PRN, Nadara Mustard, MD .  ondansetron (ZOFRAN) tablet 4 mg, 4 mg, Oral, Q6H PRN **OR** ondansetron (ZOFRAN) injection 4 mg, 4 mg, Intravenous, Q6H PRN, Nadara Mustard, MD .  polyethylene glycol (MIRALAX / GLYCOLAX) packet 17 g, 17 g, Oral, Daily PRN, Nadara Mustard, MD .  potassium chloride 10 mEq in 100 mL IVPB, 10 mEq, Intravenous, Q1 Hr x 4, Mikhail, [place], DO .  tamsulosin (FLOMAX) capsule 0.4 mg, 0.4 mg, Oral, Daily, Nadara Mustard, MD, 0.4 mg at 07/01/17 5427  Exam: Current vital signs: BP (!) 153/94   Pulse 73   Temp 99.5 F (37.5 C) (Oral)   Resp (!) 24   Ht 6' (1.829 m)   Wt 92 kg (202 lb 13.2 oz)   SpO2 100%   BMI 27.51 kg/m  Vital signs in last 24 hours: Temp:  [97.5 F (36.4 C)-99.5 F (37.5 C)] 99.5 F (37.5 C) (04/05 1843) Pulse Rate:  [31-134] 73 (04/05 1855) Resp:  [14-30] 24 (04/05 1855) BP: (97-156)/(67-99) 153/94 (04/05 1855) SpO2:  [83 %-100 %] 100 % (04/05 1855) FiO2 (%):  [50 %-100 %] 50 % (04/05 1843) Weight:  [92 kg (202 lb 13.2 oz)-95.6 kg (210 lb 12.2 oz)] 92 kg (202 lb  13.2 oz) (04/05 1843) General: Sedated intubated, no sedation. HEENT: Normocephalic, atraumatic, moist oral mucous membranes Lungs: Clear to auscultation CVS: S1-S2 heard, regular rate rhythm Abdomen: Nondistended nontender Extremities: Right BKA with wound VAC in place Neurological exam Patient is obtunded, does not open eyes to voice or noxious symptoms. He spontaneously opens eyes intermittently. He does not follow any commands. His gaze is mildly disconjugate with the left eye exotropic. He does not blink to threat His pupils are equal round reactive to light Facial asymmetry difficult to ascertain Poor oral hygiene No response to noxious stimulus in terms of any movement. No spontaneous movements. On deep suctioning and oral care, he did cough and seemed that there was some flexor response. No obvious posturing.  Labs I have reviewed labs in epic and  the results pertinent to this consultation are:  CBC    Component Value Date/Time   WBC 15.5 (H) 07/02/2017 0331   RBC 4.23 07/02/2017 0331   HGB 11.8 (L) 07/02/2017 0331   HCT 35.9 (L) 07/02/2017 0331   PLT 258 07/02/2017 0331   MCV 84.9 07/02/2017 0331   MCH 27.9 07/02/2017 0331   MCHC 32.9 07/02/2017 0331   RDW 13.8 07/02/2017 0331   LYMPHSABS 1.3 06/28/2017 1833   MONOABS 1.2 (H) 06/28/2017 1833   EOSABS 0.0 06/28/2017 1833   BASOSABS 0.0 06/28/2017 1833    CMP     Component Value Date/Time   NA 130 (L) 07/02/2017 0331   K 3.7 07/02/2017 0331   CL 93 (L) 07/02/2017 0331   CO2 25 07/02/2017 0331   GLUCOSE 182 (H) 07/02/2017 0331   BUN 20 07/02/2017 0331   CREATININE 1.13 07/02/2017 0331   CREATININE 1.43 (H) 09/21/2012 1150   CALCIUM 8.0 (L) 07/02/2017 0331   PROT 6.0 (L) 06/29/2017 0356   ALBUMIN 2.2 (L) 06/29/2017 0356   AST 35 06/29/2017 0356   ALT 36 06/29/2017 0356   ALKPHOS 80 06/29/2017 0356   BILITOT 1.5 (H) 06/29/2017 0356   GFRNONAA >60 07/02/2017 0331   GFRAA >60 07/02/2017 0331     Imaging I have personally reviewed the images obtained:  CT-scan of the brain -no acute changes.  Extensive nonspecific chronic white matter changes.  Assessment:  72 year old man past history of diabetes, diastolic and systolic heart failure, hypertension admitted for altered mental status on 06/28/2017.  Also reported generalized weakness and recurrent falls. Found to have necrotic foot on the right.  Also found to have sepsis with hypotension, tachycardia leukocytosis and elevated lactate. Underwent right BKA today and continues to be obtunded.  Primary team concern for underlying possible seizure activity. On my exam, he was attempting to wake up some with suctioning etc. My suspicion for seizure activity is low. Given the hypotension and other comorbidities and concurrent illnesses, I feel that further imaging with MRI would help determine if there is any underlying stroke. Top of my differential at this time is toxic metabolic encephalopathy in the setting of severe sepsis, hypotension. Stroke should be ruled out. I am not aversive to obtaining a routine EEG in the morning.  Impression: Toxic metabolic encephalopathy Evaluate for stroke Evaluate for underlying seizures  Recommendations: -Stat MRI brain without contrast -EEG in the morning -Check CBC, BMP -Check ammonia levels -Check TSH -Correction of toxic metabolic derangements as you are We will continue to follow with you.  -- Milon Dikes, MD [facility] Pager: [phone] If 7pm to 7am, please call on call as listed on AMION.  CRITICAL CARE ATTESTATION This patient is critically ill and at significant risk of neurological worsening, death and care requires constant monitoring of vital signs, hemodynamics,respiratory and cardiac monitoring. I spent 45  minutes of neurocritical care time performing neurological assessment, discussion with family, other specialists and medical decision making of high  complexityin the care of  this patient.

## 2017-07-02 NOTE — Consult Note (Signed)
Critical care attending consultation/admission note:  Reason for admission: obtundation post amputation.  Summary of admission history and hospital course: 72 year old man with history of DM, mixed heart failure and HTN.  Admitted with altered mental status on 4/1. Found crawling on the floor of his home. Generalized weakness for past 2 days. History of recurrent falls. Poor follow-up for DM, reported symptoms of peripheral neuropathy.    Increasing obtundation while in hospital and found to have severe diabetic foot infection with gangrene as cause of sepsis with hypotension, tachycardia, leukocytosis and elevated lactate.   Underwent transtibial amputation for gangrenous ulcers with abscess right forefoot.  Hypotension in OR requiring epinephrine following induction of anesthesia. Remained intubated and minimally responsive now 2H+ following surgery.  Social history: Ambulatory   independently at baseline   reports that he has been smoking cigarettes.  He has a 80.00 pack-year smoking history. He quit smokeless tobacco use about 6 years ago. He reports that he drinks about 3.6 oz of alcohol per week. His drug history is not on file.     The following issues were addressed on rounds today:  1.  Encephalopathy: currently on no sedative infusion and has received no analgesics while in PACU. On examination: patient mouths the tube minimally. Sluggish eye opening only with painful stimuli. Roving eye movements. No seizure activity. Abdomen is soft with no HSM, no icterus. - Suspect metabolic encephalopathy from sepsis, likely slow to clear given DM related microvascular disease.  - CT head to rule out CVA or metastatic infection given S.viridans bacteremia.   2.  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 Has had definitive source control and is con appropriate empiric antibiotics with Zosyn and vancomycin.  Presently has been weaned off phenylephrine and is maintaining HR 72 and MAP 96.  Extremities are warm and capillary refill is adequate. HS are normal and there are no stigmata of endocarditis. Stump is intact with VAC in place.  WBC 15.5. Cultures show S. Agalactiae in [facility] and BCID negative otherwise.  Organism present in aerobic and anaerobic bottles of 2 different sets.  - Can de-escalate antibiotics to ceftriaxone. - Echocardiogram to rule out endocarditis as well as repeat blood cultures to ensure clearance.  Safety and quality of care items:  Sedation vacation with dose reduction: currently on no sedatives Early mobility: not appropriate due to mental status. VAP bundle: ordered Appropriate for SBT: not due to mental status. Secondary prevention CAD: once extubated Line removal: peripherals only. Stress ulcer prophylaxis: start Pepcid Laxative regimen: prn Nutritional status: Moderate risk Enteral nutrition: initiate feeds per PEP-UP Foley catheter removal: required to monitor urine output Renal dose adjustment: not required as creatinine normal 1.13 Electrolyte repletion: not required. Hyponatremia due to volume contraction. DVT prophylaxis: Lovenox 40/d. PLT 258 Transfusion indications: HB: 11.8 anemia of chronic disease, no transfusion indicated. Antibiotic de-escalation: narrow to ceftriaxone Sepsis care elements: Lactate initially 1.8 Glycemic target: 70-180 - manage with SSI Steroid use: no systemic steroids. Labs/CXR ordered: daily labs ordered.  Summary and disposition:  Remains obtunded despite adequate source control. Either needs time or has other focus of infection driving mentation. Admit to ICU  Patient remains critically ill due to respiratory failure requiring mechanical ventilation..  They remain at high risk for life/limb threatening clinical deterioration requiring frequent reassessment and modifications of care.  Services provided include examination of the patient, review of relevant ancillary tests, prescription of lifesaving therapies,  review of medications and prophylactic therapy and multidisciplinary rounding.  Total critical  care time excluding separately billable procedures: 60  Minutes.  Lynnell Catalan, MD Critical Care Attending. [facility]. Pager: [phone] After hours: ([phone])

## 2017-07-02 NOTE — Progress Notes (Signed)
Patient transported to MRI and back without incident. Placed on same setting while in MRI on the MRI vent. Then transported back and no issues. Patient currently back in room and doing well.

## 2017-07-02 NOTE — Anesthesia Postprocedure Evaluation (Signed)
Anesthesia Post Note  Patient: Miguel Harvey  Procedure(s) Performed: RIGHT BELOW KNEE AMPUTATION (Right )     Patient location during evaluation: PACU Anesthesia Type: General Level of consciousness: obtunded/minimal responses and patient remains intubated per anesthesia plan Pain management: pain level controlled Vital Signs Assessment: post-procedure vital signs reviewed and stable Respiratory status: patient on ventilator - see flowsheet for VS Cardiovascular status: stable (requiring low dose phenylephrine) Postop Assessment: no apparent nausea or vomiting Anesthetic complications: yes Anesthetic complication details: anesthesia complicationsComments: Discussed with hospitalist, Dr. Catha Gosselin. Pt with extreme hypotension in OR, required epi and brief chest compressions with quick response. Although patient was lethargic pre-op, he remains minimally responsive to stimulation. Will leave intubated until more responsive.  Sandford Craze, MD    Last Vitals:  Vitals:   07/02/17 1127 07/02/17 1130  BP: 121/79 121/80  Pulse:    Resp: 14   Temp:    SpO2:      Last Pain:  Vitals:   07/02/17 0510  TempSrc: Oral  PainSc:                  Thurza Kwiecinski,E. Davidlee Jeanbaptiste

## 2017-07-02 NOTE — Op Note (Signed)
   Date of Surgery: 07/02/2017  INDICATIONS: Mr. Miguel Harvey is a 72 y.o.-year-old male who has abscess and gangrenous ulcers on the right forefoot.  Patient presents at this time for transtibial amputation.  I have discussed surgery with his daughter and the patient and they both agreed to proceed with surgery.Marland Kitchen  PREOPERATIVE DIAGNOSIS: Gangrenous ulcers with abscess right forefoot  POSTOPERATIVE DIAGNOSIS: Same.  PROCEDURE: Transtibial amputation Application of Prevena wound VAC  SURGEON: Lajoyce Corners, M.D.  ANESTHESIA:  general  IV FLUIDS AND URINE: See anesthesia.  ESTIMATED BLOOD LOSS: Minimal mL.  Tourniquet time: 6 minutes  COMPLICATIONS: Patient had a hypotensive episode that was treated with epinephrine including approximately 60 seconds of chest compression.  Patient was taken to the PACU intubated spontaneously breathing..  DESCRIPTION OF PROCEDURE: The patient was brought to the operating room and underwent a general anesthetic. After adequate levels of anesthesia were obtained patient's lower extremity was prepped using DuraPrep draped into a sterile field. A timeout was called. The foot was draped out of the sterile field with impervious stockinette. A transverse incision was made 11 cm distal to the tibial tubercle. This curved proximally and a large posterior flap was created. The tibia was transected 1 cm proximal to the skin incision. The fibula was transected just proximal to the tibial incision. The tibia was beveled anteriorly. A large posterior flap was created. The sciatic nerve was pulled cut and allowed to retract. The vascular bundles were suture ligated with 2-0 silk. The deep and superficial fascial layers were closed using #1 Vicryl. The skin was closed using staples and 2-0 nylon. The wound was covered with a Prevena wound VAC.   Patient was taken to the PACU in guarded condition intubated spontaneously breathing good blood pressure good heart rate good femoral and radial  pulses.   DISCHARGE PLANNING:  Antibiotic duration: Continue antibiotics for 24 hours  Weightbearing: Nonweightbearing  Pain medication: As needed.  Dressing care/ Wound VAC: Wound VAC to be left in place for 1 week.  Discharge to: Skilled nursing.  Follow-up: In the office 1 week post operative.  Aldean Baker, MD [facility] 9:25 AM

## 2017-07-02 NOTE — Progress Notes (Signed)
Pharmacy Antibiotic Note  Miguel Harvey is a 72 y.o. male on day # 4 Vancomycin and Zosyn for bilateral foot infections with necrosis. Also with Strep viridans in 1 of 2 blood cultures, which had been thought to be possible contaminant and now Diphtheroids in 2nd blood culture. S/p right BKA today. Currently in PACU.  Antibiotics de-escalating to Ceftriaxone.  Cefazolin 2gm IV given pre-op.  Repeat blood cultures sent today.   Afebrile, WBC down to 15.5. Renal function stable.  Plan:  Stop Vanc and Zosyn.  Begin Ceftriaxone 2gm IV q24hrs tonight.  Follow up final cultures.  Height: 5\' 9"  (175.3 cm) Weight: 210 lb 12.2 oz (95.6 kg) IBW/kg (Calculated) : 70.7  Temp (24hrs), Avg:98.1 F (36.7 C), Min:97.5 F (36.4 C), Max:98.6 F (37 C)  Recent Labs  Lab 06/28/17 1816 06/28/17 1833 06/28/17 2106 06/29/17 0114 06/29/17 0356 06/30/17 0325 07/01/17 0030 07/02/17 0331  WBC  --  19.2*  --   --  17.9*  --  15.6* 15.5*  CREATININE  --  0.93  --   --  0.94 0.99 0.97 1.13  LATICACIDVEN 2.34*  --  1.69 1.6 1.8  --   --   --     Estimated Creatinine Clearance: 67.4 mL/min (by C-G formula based on SCr of 1.13 mg/dL).    No Known Allergies  Antimicrobials this admission:  Vancomycin 4/1>>4/5  Zosyn 4/1>>4/5  Cefazolin pre-op x 1 on 4/5  Ceftriaxone 4/5>>  Dose adjustments this admission:  n/a  Microbiology results:  4/1 blood x 2 - Strep viridans in 1 of 2 cultures - possible contaminant, re-incubated    - 2nd culture with Diphtheroids (Corynebacterium species) - also reincubated  4/1 MRSA PCR negative  4/5 blood x 2 -  Thank you for allowing pharmacy to be a part of this patient's care.  Dennie Fetters, [place] Pager: [phone] 07/02/2017 5:44 PM

## 2017-07-02 NOTE — Progress Notes (Signed)
PROGRESS NOTE    Miguel Harvey  NFA:[id number] DOB: Oct 21, 1945 DOA: 06/28/2017 PCP: Carlyle Dolly, MD   Brief Narrative:  HPI On 06/28/2017 by Dr. Phillips Hay Miguel Harvey is a 72 y.o. male with medical history significant of combined systolic and diastolic CHF, diabetes, hypertension dyslipidemia   Presented with   inability to walk and generalized weakness for past 2 days reported to ER staff that he was dragging himself around on all fours around the house has not been taking his medications had significant nausea and vomiting.  He has known history of diabetes but have not been followed up with primary care provider Has had ongoing recurrent falls recently.  Reports 2 weeks ago he did have a URI which was associated mild cough seems to have now improved with 3 days ago became so weak he could not ambulate reported difficult to sensation bilateral lower extremities.  Patient continues to smoke. Denies any fevers. No significant pain sensation in lower feet.  Interim history Found to have diabetic foot infection with necrosis.  Orthopedic surgery consulted and appreciated, s/p transtibial amputation.   Assessment & Plan   Acute respiratory failure/hypotension -patient had hypotensive episode during and after surgery- required treatment with epinephrine with brief chest compressions given to augment circulation- discussed with Dr. Sharol Given as well as Dr. Glennon Mac (anesthesia) -wonder if this is secondary to sepsis -Currently still in PACU and intubated, unable to wean off vent. VSS -Discussed with PCCM, patient will be admitted to ICU for further monitoring   Sepsis secondary to right Diabetic foot infection with necrosis -Presented with hypotension, tachycardia, leukocytosis with elevated lactic acid -X-ray of the foot showed suspected ulcer above the head of the first metatarsal with adjacent subcutaneous emphysema, no radiographical evidence of osteomyelitis -Blood cultures 1/2  Strep viridans  -ABI: Right 0.8, Left 0.79 -Patient with elevated ESR and CRP -Orthopedics consulted and appreciated, plan for surgery on 07/02/2017.  Dr. Sharol Given felt no need for MRI -Continue vancomycin and Zosyn -S/p transtibial amputation   Chronic combined systolic and diastolic heart failure -Last echocardiogram 2013 showed an EF of 40-45%, global hypokinesis -Appears to be euvolemic -Echocardiogram EF 46%, grade 2 diastolic dysfunction -Need to monitor intake and output, daily weights  NSVT -had 6 beat run of NSVT on 06/30/2017 -vitals stable, asymptomatic  -Potassium 3.7, Magnesium was 2.1 -echocardiogram as above  Hypokalemia -resolved with replacement- currently 3.7 (will replace for goal of 4) -continue to monitor BMP  Diabetes mellitus, type II -Hemoglobin A1c 9.2 -Metformin held -Continue Lantus, insulin sliding scale, CBG monitoring  Essential hypertension- complicated by brief episode of hypotension -Continue IV hydralazine as needed -hold coreg  Dyslipidemia -Continue statin  Elevated troponin -Suspect secondary to demand ischemia and sepsis -No complaints of chest pain  Tobacco abuse -Smoking cessation discussed, continue nicotine patch  DVT Prophylaxis  SCDs  Code Status: Full  Family Communication: None at bedside  Disposition Plan: Admitted. Will transfer to ICU given that patient unable to be weaned off of vent at time  Consultants Orthopedics, Dr. Sharol Given PCCM  Procedures  Echocardiogram ABI Transtibial amputation, application of prevena wound VAC  Antibiotics   Anti-infectives (From admission, onward)   Start     Dose/Rate Route Frequency Ordered Stop   07/02/17 0900  ceFAZolin (ANCEF) IVPB 2g/100 mL premix     2 g 200 mL/hr over 30 Minutes Intravenous On call to O.R. 07/01/17 1956 07/02/17 0837   06/30/17 2300  [MAR Hold]  vancomycin (VANCOCIN) IVPB  1000 mg/200 mL premix     (MAR Hold since Fri 07/02/2017 at 0628. Reason: Transfer to a  Procedural area.)   1,000 mg 200 mL/hr over 60 Minutes Intravenous Every 12 hours 06/30/17 1249     06/30/17 2000  [MAR Hold]  piperacillin-tazobactam (ZOSYN) IVPB 3.375 g     (MAR Hold since Fri 07/02/2017 at 0628. Reason: Transfer to a Procedural area.)   3.375 g 12.5 mL/hr over 240 Minutes Intravenous Every 8 hours 06/30/17 1249     06/29/17 0800  vancomycin (VANCOCIN) IVPB 1000 mg/200 mL premix  Status:  Discontinued     1,000 mg 200 mL/hr over 60 Minutes Intravenous Every 12 hours 06/29/17 0048 06/30/17 1249   06/29/17 0200  piperacillin-tazobactam (ZOSYN) IVPB 3.375 g  Status:  Discontinued     3.375 g 12.5 mL/hr over 240 Minutes Intravenous Every 8 hours 06/28/17 2202 06/30/17 1249   06/29/17 0115  piperacillin-tazobactam (ZOSYN) IVPB 3.375 g  Status:  Discontinued     3.375 g 100 mL/hr over 30 Minutes Intravenous  Once 06/29/17 0101 06/29/17 0103   06/28/17 2115  vancomycin (VANCOCIN) IVPB 1000 mg/200 mL premix     1,000 mg 200 mL/hr over 60 Minutes Intravenous  Once 06/28/17 2112 06/28/17 2324   06/28/17 1830  piperacillin-tazobactam (ZOSYN) IVPB 3.375 g     3.375 g 100 mL/hr over 30 Minutes Intravenous  Once 06/28/17 1825 06/28/17 1910      Subjective:   Miguel Harvey seen and examined today in PACU. Currently intubated and responsive to deep/painful stimuli.  Objective:   Vitals:   07/02/17 1136 07/02/17 1139 07/02/17 1142 07/02/17 1145  BP: 120/79 124/80 120/83 (!) 120/92  Pulse: (!) 134 67 66 61  Resp: 15 15 16 15   Temp:    98.3 F (36.8 C)  TempSrc:      SpO2: 99% 100% 99% 100%  Weight:      Height:        Intake/Output Summary (Last 24 hours) at 07/02/2017 1238 Last data filed at 07/02/2017 0950 Gross per 24 hour  Intake 1200 ml  Output 525 ml  Net 675 ml   Filed Weights   06/30/17 0538 07/01/17 0442 07/02/17 0509  Weight: 95.6 kg (210 lb 12.2 oz) 95.7 kg (210 lb 15.7 oz) 95.6 kg (210 lb 12.2 oz)   Exam  General: Well developed, well nourished, NAD,  appears stated age  HEENT: NCAT, mucous membranes moist. ET tube in place  Neck: Supple  Cardiovascular: S1 S2 auscultated, RRR, no murmur   Respiratory: Clear to auscultation bilaterally with equal chest rise  Abdomen: Soft, nontender, nondistended, + bowel sounds  Extremities: warm dry without cyanosis clubbing of LLE. 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  Neuro: Cannot assess   Data Reviewed: I have personally reviewed following labs and imaging studies  CBC: Recent Labs  Lab 06/28/17 1833 06/29/17 0356 07/01/17 0030 07/02/17 0331  WBC 19.2* 17.9* 15.6* 15.5*  NEUTROABS 16.7*  --   --   --   HGB 13.3 12.5* 12.6* 11.8*  HCT 38.8* 38.0* 38.6* 35.9*  MCV 85.8 85.0 86.2 84.9  PLT 247 247 279 121   Basic Metabolic Panel: Recent Labs  Lab 06/28/17 1833 06/29/17 0356 06/30/17 0325 06/30/17 2301 07/01/17 0030 07/02/17 0331  NA 134* 134* 134*  --  132* 130*  K 4.1 3.5 3.0* 4.2 4.0 3.7  CL 93* 96* 93*  --  94* 93*  CO2 21* 23 26  --  25 25  GLUCOSE 277* 236* 222*  --  200* 182*  BUN 21* 15 10  --  14 20  CREATININE 0.93 0.94 0.99  --  0.97 1.13  CALCIUM 8.2* 8.2* 7.9*  --  8.2* 8.0*  MG  --  2.0 2.1 2.1  --   --   PHOS  --  2.9  --   --   --   --    GFR: Estimated Creatinine Clearance: 67.4 mL/min (by C-G formula based on SCr of 1.13 mg/dL). Liver Function Tests: Recent Labs  Lab 06/28/17 1833 06/29/17 0356  AST 46* 35  ALT 41 36  ALKPHOS 86 80  BILITOT 2.1* 1.5*  PROT 6.6 6.0*  ALBUMIN 2.5* 2.2*   No results for input(s): LIPASE, AMYLASE in the last 168 hours. No results for input(s): AMMONIA in the last 168 hours. Coagulation Profile: Recent Labs  Lab 06/29/17 0114  INR 1.28   Cardiac Enzymes: Recent Labs  Lab 06/28/17 1833 06/28/17 2325  TROPONINI 0.04* 0.05*   BNP (last 3 results) No results for input(s): PROBNP in the last 8760 hours. HbA1C: No results for input(s): HGBA1C in the last 72 hours. CBG: Recent Labs  Lab 07/01/17 1713 07/01/17 2309  07/02/17 0551 07/02/17 0759 07/02/17 0945  GLUCAP 204* 221* 174* 199* 181*   Lipid Profile: No results for input(s): CHOL, HDL, LDLCALC, TRIG, CHOLHDL, LDLDIRECT in the last 72 hours. Thyroid Function Tests: No results for input(s): TSH, T4TOTAL, FREET4, T3FREE, THYROIDAB in the last 72 hours. Anemia Panel: No results for input(s): VITAMINB12, FOLATE, FERRITIN, TIBC, IRON, RETICCTPCT in the last 72 hours. Urine analysis:    Component Value Date/Time   COLORURINE AMBER (A) 06/28/2017 1833   APPEARANCEUR CLEAR 06/28/2017 1833   LABSPEC 1.021 06/28/2017 1833   PHURINE 5.0 06/28/2017 1833   GLUCOSEU >=500 (A) 06/28/2017 1833   HGBUR SMALL (A) 06/28/2017 1833   BILIRUBINUR NEGATIVE 06/28/2017 1833   KETONESUR 20 (A) 06/28/2017 1833   PROTEINUR 30 (A) 06/28/2017 1833   UROBILINOGEN 0.2 05/07/2011 1813   NITRITE NEGATIVE 06/28/2017 1833   LEUKOCYTESUR NEGATIVE 06/28/2017 1833   Sepsis Labs: @LABRCNTIP (procalcitonin:4,lacticidven:4)  ) Recent Results (from the past 240 hour(s))  MRSA PCR Screening     Status: None   Collection Time: 06/28/17  2:59 AM  Result Value Ref Range Status   MRSA by PCR NEGATIVE NEGATIVE Final    Comment:        The GeneXpert MRSA Assay (FDA approved for NASAL specimens only), is one component of a comprehensive MRSA colonization surveillance program. It is not intended to diagnose MRSA infection nor to guide or monitor treatment for MRSA infections. Performed at [facility], [place] [address] [place], [place] [address]   Blood Culture (routine x 2)     Status: Abnormal (Preliminary result)   Collection Time: 06/28/17  6:33 PM  Result Value Ref Range Status   Specimen Description   Final    BLOOD Performed at [facility] [address], [place], [place] [address]    Special Requests   Final    BOTTLES DRAWN AEROBIC AND ANAEROBIC Blood Culture adequate volume   Culture  Setup Time   Final    GRAM POSITIVE  RODS ANAEROBIC BOTTLE ONLY CRITICAL RESULT CALLED TO, READ BACK BY AND VERIFIED WITH: J LEDFORD PHARMD 07/01/17 0001 JDW    Culture (A)  Final    DIPHTHEROIDS(CORYNEBACTERIUM SPECIES) CULTURE REINCUBATED FOR BETTER GROWTH Performed at [facility], [address] [place], [place] [address]    Report Status PENDING  Incomplete  Blood Culture (routine x 2)     Status: Abnormal (Preliminary result)   Collection Time: 06/28/17  6:33 PM  Result Value Ref Range Status   Specimen Description   Final    BLOOD BLOOD LEFT ARM Performed at [facility] [address] [place], [place] [address]    Special Requests   Final    BOTTLES DRAWN AEROBIC AND ANAEROBIC Blood Culture adequate volume Performed at [facility] [address] [place], [place] [address]    Culture  Setup Time   Final    GRAM POSITIVE COCCI IN CHAINS AEROBIC BOTTLE ONLY CRITICAL RESULT CALLED TO, READ BACK BY AND VERIFIED WITH: N. BATCHELDER PHARMD, AT 0093 06/30/17 BY D. VANHOOK    Culture (A)  Final    VIRIDANS STREPTOCOCCUS CULTURE REINCUBATED FOR BETTER GROWTH Performed at [facility], [place] [address] [place], [place] [address]    Report Status PENDING  Incomplete  Blood Culture ID Panel (Reflexed)     Status: Abnormal   Collection Time: 06/28/17  6:33 PM  Result Value Ref Range Status   Enterococcus species NOT DETECTED NOT DETECTED Final   Listeria monocytogenes NOT DETECTED NOT DETECTED Final   Staphylococcus species NOT DETECTED NOT DETECTED Final   Staphylococcus aureus NOT DETECTED NOT DETECTED Final   Streptococcus species DETECTED (A) NOT DETECTED Final    Comment: Not Enterococcus species, Streptococcus agalactiae, Streptococcus pyogenes, or Streptococcus pneumoniae. CRITICAL RESULT CALLED TO, READ BACK BY AND VERIFIED WITH: N. BATCHELDER PHARMD, AT 9371 06/30/17 BY D. VANHOOK    Streptococcus agalactiae NOT DETECTED NOT DETECTED Final   Streptococcus  pneumoniae NOT DETECTED NOT DETECTED Final   Streptococcus pyogenes NOT DETECTED NOT DETECTED Final   Acinetobacter baumannii NOT DETECTED NOT DETECTED Final   Enterobacteriaceae species NOT DETECTED NOT DETECTED Final   Enterobacter cloacae complex NOT DETECTED NOT DETECTED Final   Escherichia coli NOT DETECTED NOT DETECTED Final   Klebsiella oxytoca NOT DETECTED NOT DETECTED Final   Klebsiella pneumoniae NOT DETECTED NOT DETECTED Final   Proteus species NOT DETECTED NOT DETECTED Final   Serratia marcescens NOT DETECTED NOT DETECTED Final   Haemophilus influenzae NOT DETECTED NOT DETECTED Final   Neisseria meningitidis NOT DETECTED NOT DETECTED Final   Pseudomonas aeruginosa NOT DETECTED NOT DETECTED Final   Candida albicans NOT DETECTED NOT DETECTED Final   Candida glabrata NOT DETECTED NOT DETECTED Final   Candida krusei NOT DETECTED NOT DETECTED Final   Candida parapsilosis NOT DETECTED NOT DETECTED Final   Candida tropicalis NOT DETECTED NOT DETECTED Final  Blood Culture ID Panel (Reflexed)     Status: None   Collection Time: 06/28/17  6:33 PM  Result Value Ref Range Status   Enterococcus species NOT DETECTED NOT DETECTED Final   Vancomycin resistance NOT DETECTED NOT DETECTED Final   Listeria monocytogenes NOT DETECTED NOT DETECTED Final   Staphylococcus species NOT DETECTED NOT DETECTED Final   Staphylococcus aureus NOT DETECTED NOT DETECTED Final   Methicillin resistance NOT DETECTED NOT DETECTED Final   Streptococcus species NOT DETECTED NOT DETECTED Final   Streptococcus agalactiae NOT DETECTED NOT DETECTED Final    Comment: CRITICAL RESULT CALLED TO, READ BACK BY AND VERIFIED WITH: J LEDFORD PHARMD 07/01/17 0001 JDW    Streptococcus pneumoniae NOT DETECTED NOT DETECTED Final   Streptococcus pyogenes NOT DETECTED NOT DETECTED Final   Acinetobacter baumannii NOT  DETECTED NOT DETECTED Final   Enterobacteriaceae species NOT DETECTED NOT DETECTED Final   Enterobacter cloacae  complex NOT DETECTED NOT DETECTED Final   Escherichia coli NOT DETECTED NOT DETECTED Final   Klebsiella oxytoca NOT DETECTED NOT DETECTED Final   Klebsiella pneumoniae NOT DETECTED NOT DETECTED Final   Proteus species NOT DETECTED NOT DETECTED Final   Serratia marcescens NOT DETECTED NOT DETECTED Final   Carbapenem resistance NOT DETECTED NOT DETECTED Final   Haemophilus influenzae NOT DETECTED NOT DETECTED Final   Neisseria meningitidis NOT DETECTED NOT DETECTED Final   Pseudomonas aeruginosa NOT DETECTED NOT DETECTED Final   Candida albicans NOT DETECTED NOT DETECTED Final   Candida glabrata NOT DETECTED NOT DETECTED Final   Candida krusei NOT DETECTED NOT DETECTED Final   Candida parapsilosis NOT DETECTED NOT DETECTED Final   Candida tropicalis NOT DETECTED NOT DETECTED Final    Comment: Performed at [facility], [place] [address], [place], [place] [address]  Surgical pcr screen     Status: None   Collection Time: 07/01/17  8:23 PM  Result Value Ref Range Status   MRSA, PCR NEGATIVE NEGATIVE Final   Staphylococcus aureus NEGATIVE NEGATIVE Final    Comment: (NOTE) The Xpert SA Assay (FDA approved for NASAL specimens in patients 64 years of age and older), is one component of a comprehensive surveillance program. It is not intended to diagnose infection nor to guide or monitor treatment. Performed at [facility], [place] [address] [place],  [address]       Radiology Studies: No results found.   Scheduled Meds: . [MAR Hold] atorvastatin  40 mg Oral Daily  . [MAR Hold] carvedilol  6.25 mg Oral BID WC  . [MAR Hold] chlorhexidine  15 mL Mouth Rinse BID  . [MAR Hold] feeding supplement (PRO-STAT SUGAR FREE 64)  30 mL Oral BID  . [MAR Hold] insulin aspart  0-5 Units Subcutaneous QHS  . [MAR Hold] insulin aspart  0-9 Units Subcutaneous TID WC  . [MAR Hold] insulin aspart  3 Units Subcutaneous TID WC  . [MAR Hold] insulin glargine  10 Units Subcutaneous QHS  .  [MAR Hold] mouth rinse  15 mL Mouth Rinse q12n4p  . [MAR Hold] multivitamin with minerals  1 tablet Oral Daily  . [MAR Hold] nicotine  14 mg Transdermal Daily  . [MAR Hold] tamsulosin  0.4 mg Oral Daily   Continuous Infusions: . [MAR Hold] piperacillin-tazobactam (ZOSYN)  IV 3.375 g (07/02/17 0336)  . [MAR Hold] vancomycin Stopped (07/02/17 0025)     LOS: 4 days   Time Spent in minutes   45 minutes  Kieran Nachtigal D.O. on 07/02/2017 at 12:38 PM  Between 7am to 7pm - Pager - [phone]  After 7pm go to [email or web address] - password [email or web address]  And look for the night coverage person covering for me after hours  [facility] Office  [phone]

## 2017-07-02 NOTE — Interval H&P Note (Signed)
History and Physical Interval Note:  07/02/2017 6:38 AM  Miguel Harvey  has presented today for surgery, with the diagnosis of Necrotic Right Forefoot Ulceration  The various methods of treatment have been discussed with the patient and family. After consideration of risks, benefits and other options for treatment, the patient has consented to  Procedure(s): RIGHT BELOW KNEE AMPUTATION (Right) as a surgical intervention .  The patient's history has been reviewed, patient examined, no change in status, stable for surgery.  I have reviewed the patient's chart and labs.  Questions were answered to the patient's satisfaction.     Miguel Harvey

## 2017-07-02 NOTE — Transfer of Care (Signed)
Immediate Anesthesia Transfer of Care Note  Patient: Miguel Harvey  Procedure(s) Performed: RIGHT BELOW KNEE AMPUTATION (Right )  Patient Location: PACU  Anesthesia Type:General  Level of Consciousness: Patient remains intubated per anesthesia plan  Airway & Oxygen Therapy: Patient placed on Ventilator (see vital sign flow sheet for setting)  Post-op Assessment: Report given to RN and Post -op Vital signs reviewed and stable  Post vital signs: Reviewed and stable  Last Vitals:  Vitals Value Taken Time  BP    Temp    Pulse    Resp    SpO2      Last Pain:  Vitals:   07/02/17 0510  TempSrc: Oral  PainSc:       Patients Stated Pain Goal: 0 (07/01/17 0800)  Complications: cardiovascular complications

## 2017-07-03 ENCOUNTER — Encounter (HOSPITAL_COMMUNITY): Payer: Self-pay | Admitting: Orthopedic Surgery

## 2017-07-03 ENCOUNTER — Inpatient Hospital Stay (HOSPITAL_COMMUNITY): Payer: Medicare Other

## 2017-07-03 DIAGNOSIS — G9340 Encephalopathy, unspecified: Secondary | ICD-10-CM

## 2017-07-03 DIAGNOSIS — I340 Nonrheumatic mitral (valve) insufficiency: Secondary | ICD-10-CM

## 2017-07-03 LAB — BASIC METABOLIC PANEL
Anion gap: 10 (ref 5–15)
BUN: 23 mg/dL — ABNORMAL HIGH (ref 6–20)
CALCIUM: 7.7 mg/dL — AB (ref 8.9–10.3)
CO2: 24 mmol/L (ref 22–32)
Chloride: 101 mmol/L (ref 101–111)
Creatinine, Ser: 1.09 mg/dL (ref 0.61–1.24)
GFR calc Af Amer: >60 mL/min (ref >60)
GLUCOSE: 151 mg/dL — AB (ref 65–99)
Potassium: 4.2 mmol/L (ref 3.5–5.1)
Sodium: 135 mmol/L (ref 135–145)

## 2017-07-03 LAB — ECHOCARDIOGRAM COMPLETE
Height: 72 in
WEIGHTICAEL: 3245.17 [oz_av]

## 2017-07-03 LAB — CULTURE, BLOOD (ROUTINE X 2): Special Requests: ADEQUATE

## 2017-07-03 LAB — PHOSPHORUS
PHOSPHORUS: 3.3 mg/dL (ref 2.5–4.6)
Phosphorus: 3.5 mg/dL (ref 2.5–4.6)

## 2017-07-03 LAB — CBC
HCT: 34.1 % — ABNORMAL LOW (ref 39.0–52.0)
Hemoglobin: 11 g/dL — ABNORMAL LOW (ref 13.0–17.0)
MCH: 27.4 pg (ref 26.0–34.0)
MCHC: 32.3 g/dL (ref 30.0–36.0)
MCV: 85 fL (ref 78.0–100.0)
PLATELETS: 233 10*3/uL (ref 150–400)
RBC: 4.01 MIL/uL — ABNORMAL LOW (ref 4.22–5.81)
RDW: 14.1 % (ref 11.5–15.5)
WBC: 11.8 10*3/uL — ABNORMAL HIGH (ref 4.0–10.5)

## 2017-07-03 LAB — GLUCOSE, CAPILLARY
GLUCOSE-CAPILLARY: 132 mg/dL — AB (ref 65–99)
GLUCOSE-CAPILLARY: 172 mg/dL — AB (ref 65–99)
Glucose-Capillary: 117 mg/dL — ABNORMAL HIGH (ref 65–99)
Glucose-Capillary: 165 mg/dL — ABNORMAL HIGH (ref 65–99)
Glucose-Capillary: 174 mg/dL — ABNORMAL HIGH (ref 65–99)
Glucose-Capillary: 218 mg/dL — ABNORMAL HIGH (ref 65–99)
Glucose-Capillary: 226 mg/dL — ABNORMAL HIGH (ref 65–99)

## 2017-07-03 LAB — MAGNESIUM: Magnesium: 2.2 mg/dL (ref 1.7–2.4)

## 2017-07-03 MED ORDER — FENTANYL CITRATE (PF) 100 MCG/2ML IJ SOLN
50.0000 ug | INTRAMUSCULAR | Status: DC | PRN
  Administered 2017-07-03 – 2017-07-04 (×4): 50 ug via INTRAVENOUS
  Filled 2017-07-03 (×4): qty 2

## 2017-07-03 MED ORDER — ADULT MULTIVITAMIN LIQUID CH
15.0000 mL | Freq: Every day | ORAL | Status: DC
  Administered 2017-07-03 – 2017-07-05 (×3): 15 mL
  Filled 2017-07-03 (×3): qty 15

## 2017-07-03 MED ORDER — INSULIN ASPART 100 UNIT/ML ~~LOC~~ SOLN
2.0000 [IU] | SUBCUTANEOUS | Status: DC
  Administered 2017-07-03: 6 [IU] via SUBCUTANEOUS
  Administered 2017-07-03 (×2): 4 [IU] via SUBCUTANEOUS
  Administered 2017-07-04: 2 [IU] via SUBCUTANEOUS
  Administered 2017-07-04: 6 [IU] via SUBCUTANEOUS
  Administered 2017-07-04 (×4): 2 [IU] via SUBCUTANEOUS
  Administered 2017-07-05 (×3): 6 [IU] via SUBCUTANEOUS

## 2017-07-03 MED ORDER — VITAL AF 1.2 CAL PO LIQD
1000.0000 mL | ORAL | Status: DC
  Administered 2017-07-03 – 2017-07-05 (×3): 1000 mL

## 2017-07-03 MED ORDER — FUROSEMIDE 10 MG/ML IJ SOLN
20.0000 mg | Freq: Once | INTRAMUSCULAR | Status: AC
  Administered 2017-07-03: 20 mg via INTRAVENOUS
  Filled 2017-07-03: qty 2

## 2017-07-03 MED ORDER — FENTANYL CITRATE (PF) 100 MCG/2ML IJ SOLN
50.0000 ug | INTRAMUSCULAR | Status: AC | PRN
  Administered 2017-07-03 (×3): 50 ug via INTRAVENOUS
  Filled 2017-07-03 (×2): qty 2

## 2017-07-03 NOTE — Progress Notes (Signed)
Name: EMRICK HENSCH MRN: [id number] DOB: Nov 17, 1945    ADMISSION DATE:  06/28/2017 CONSULTATION DATE:  07/02/17  CHIEF COMPLAINT:  confusion  BRIEF PATIENT DESCRIPTION:  72 year old male with severe diabetic foot infection, septic shock and encephalopathy    SIGNIFICANT EVENTS  07/02/17: underwent  transtibial amputation for gangrenous ulcers with abscess right forefoot.  Hypotension in OR requiring epinephrine following induction of anesthesia. Remained intubated and minimally responsive. Admitted to MICU  STUDIES:  MRI Brain 07/02/17: no acute changes   HISTORY OF PRESENT ILLNESS:  72 year old man with history of DM, mixed heart failure and HTN.  Admitted with altered mental status on 4/1. Found crawling on the floor of his home. Generalized weakness for past 2 days. History of recurrent falls. Poor follow-up for DM, reported symptoms of peripheral neuropathy.    Increasing obtundation while in hospital and found to have severe diabetic foot infection with gangrene as cause of sepsis with hypotension, tachycardia, leukocytosis and elevated lactate.   Underwent transtibial amputation for gangrenous ulcers with abscess right forefoot.  Hypotension in OR requiring epinephrine following induction of anesthesia. Remained intubated and minimally responsive  PAST MEDICAL HISTORY :   has a past medical history of CHF (congestive heart failure) (HCC), Diabetes mellitus, and Hypertension.  has a past surgical history that includes cyst removal from  right leg about 6 years ago; left heart catheterization with coronary angiogram (N/A, 05/11/2011); and Amputation (Right, 07/02/2017). Prior to Admission medications   Medication Sig Start Date End Date Taking? Authorizing Provider  aspirin 325 MG tablet Take 325 mg by mouth daily.   Yes [provider]  atorvastatin (LIPITOR) 40 MG tablet take 1 tablet by mouth once daily Patient not taking: Reported on 12/23/2016 12/30/15   Beaulah Dinning, MD  carvedilol (COREG) 6.25 MG tablet take 1 tablet by mouth twice a day with meals Patient not taking: Reported on 12/23/2016 05/01/14   Briscoe Deutscher, DO  enalapril-hydrochlorothiazide (VASERETIC) 10-25 MG tablet take 1 tablet by mouth once daily Patient not taking: Reported on 12/23/2016 01/01/16   Beaulah Dinning, MD  metFORMIN (GLUCOPHAGE) 1000 MG tablet Take 0.5 tablets (500 mg total) by mouth 2 (two) times daily with a meal. Patient not taking: Reported on 12/23/2016 09/02/12   Brent Bulla, MD   No Known Allergies  FAMILY HISTORY:  family history includes CAD in his other; Diabetes in his other. SOCIAL HISTORY:  reports that he has been smoking cigarettes.  He has a 80.00 pack-year smoking history. He quit smokeless tobacco use about 6 years ago. He reports that he drinks about 3.6 oz of alcohol per week.  SUBJECTIVE:   REVIEW OF SYSTEMS:   Full Review of Systems negative except otherwise noted as above  VITAL SIGNS: Temp:  [98.1 F (36.7 C)-99.5 F (37.5 C)] 98.2 F (36.8 C) (04/06 0800) Pulse Rate:  [31-114] 76 (04/06 1200) Resp:  [14-24] 15 (04/06 1200) BP: (117-161)/(69-99) 149/94 (04/06 1200) SpO2:  [83 %-100 %] 100 % (04/06 1200) FiO2 (%):  [30 %-100 %] 30 % (04/06 1129) Weight:  [202 lb 13.2 oz (92 kg)] 202 lb 13.2 oz (92 kg) (04/06 0426)  PHYSICAL EXAMINATION: Physical Exam  Constitutional: No distress.  Intubated, no acute distress  HENT:  ETT in place  Eyes: EOM are normal. Right eye exhibits no discharge. Left eye exhibits no discharge. No scleral icterus.  Roving eye movements, no gaze preference or deviation  Neck: No JVD present.  Cardiovascular: Normal rate.  No murmur heard. Pulmonary/Chest: Effort normal and breath sounds normal. No stridor. No respiratory distress.  Abdominal: Soft. He exhibits no distension. There is no tenderness.  Musculoskeletal: He exhibits no edema.  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  Awake, alert, will open eyes to command and look to examiner though inconsistently. Will occasionally raise LUE to command though does not consistently and will not follow commands elsewhere.    Skin: Skin is warm and dry. Capillary refill takes less than 2 seconds. He is not diaphoretic.     Recent Labs  Lab 07/02/17 0331 07/02/17 2041 07/03/17 0521  NA 130* 133* 135  K 3.7 4.6 4.2  CL 93* 97* 101  CO2 25 22 24   BUN 20 22* 23*  CREATININE 1.13 1.26* 1.09  GLUCOSE 182* 223* 151*   Recent Labs  Lab 07/02/17 0331 07/02/17 2041 07/03/17 0521  HGB 11.8* 11.8* 11.0*  HCT 35.9* 36.1* 34.1*  WBC 15.5* 17.1* 11.8*  PLT 258 242 233   Ct Head W & Wo Contrast  Result Date: 07/02/2017 CLINICAL DATA:  Altered mental status, possible metabolic encephalopathy. Assess for stroke or abscess; forefoot abscess. History of hypertension, diabetes. EXAM: CT HEAD WITHOUT AND WITH CONTRAST TECHNIQUE: Contiguous axial images were obtained from the base of the skull through the vertex without and with intravenous contrast CONTRAST:  ISOVUE-300 IOPAMIDOL (ISOVUE-300) INJECTION 61% COMPARISON:  None. FINDINGS: BRAIN: No intraparenchymal hemorrhage, mass effect nor midline shift. The ventricles and sulci are normal for age. Patchy supratentorial white matter hypodensities less than expected for patient's age, though non-specific are most compatible with chronic small vessel ischemic disease. No acute large vascular territory infarcts. No abnormal extra-axial fluid collections. Basal cisterns are patent. No abnormal parenchymal or extra-axial enhancement. VASCULAR: Mild calcific atherosclerosis of the carotid siphons. SKULL: No skull fracture. No significant scalp soft tissue swelling. SINUSES/ORBITS: Trace LEFT mastoid effusion without air cell coalescence. Paranasal sinuses are well aerated.The included ocular globes and orbital contents are non-suspicious. OTHER: None. IMPRESSION: Negative CT HEAD  with and without contrast for age. Electronically Signed   By: Awilda Metro M.D.   On: 07/02/2017 16:01   Dg Chest Port 1 View  Result Date: 07/02/2017 CLINICAL DATA:  Endotracheal tube placement. EXAM: PORTABLE CHEST 1 VIEW COMPARISON:  Radiographs of June 28, 2017. FINDINGS: Stable cardiomediastinal silhouette. Endotracheal tube is seen projected over tracheal air shadow with distal tip 3 cm above the carina. No pneumothorax or pleural effusion is noted. No acute pulmonary disease is noted. Bony thorax is unremarkable. IMPRESSION: Endotracheal tube in grossly good position. No acute cardiopulmonary abnormality seen. Electronically Signed   By: Lupita Raider, M.D.   On: 07/02/2017 12:52   Dg Abd Portable 1v  Result Date: 07/02/2017 CLINICAL DATA:  Orogastric tube placement EXAM: PORTABLE ABDOMEN - 1 VIEW COMPARISON:  None. FINDINGS: In orogastric tube coils once in the stomach and terminates in the stomach body oriented towards the fundus. Thoracolumbar spondylosis. IMPRESSION: 1. OG tube tip: Stomach body, oriented towards the fundus. Electronically Signed   By: Gaylyn Rong M.D.   On: 07/02/2017 19:36   Mr Brain Limited Wo Contrast  Result Date: 07/02/2017 CLINICAL DATA:  Altered level of consciousness, somnolent. Evaluate encephalopathy. History of forefoot abscess, hypertension, hyperlipidemia. EXAM: MRI HEAD WITHOUT CONTRAST TECHNIQUE: Multiplanar, multiecho pulse sequences of the brain and surrounding structures were obtained without intravenous contrast. COMPARISON:  CT HEAD July 02, 2017. FINDINGS: Sequences are moderately to severely motion degraded. INTRACRANIAL CONTENTS: No reduced diffusion to suggest acute ischemia, typical infection or hypercellular tumor. Nondiagnostic coronal diffusion weighted imaging. Nondiagnostic hemosiderin sensitive sequence. Patchy to confluent supratentorial white matter FLAIR T2 hyperintensities. The ventricles and sulci are normal for patient's age.  No suspicious parenchymal signal, masses, mass effect. No abnormal extra-axial fluid collections. No extra-axial masses. VASCULAR: Normal major intracranial vascular flow voids present at skull base. SKULL AND UPPER CERVICAL SPINE: Nondiagnostic assessment due to severely motion degraded sagittal T1. SINUSES/ORBITS: The mastoid air-cells and included paranasal sinuses are well-aerated.The included ocular globes and orbital contents are non-suspicious. OTHER: 3.1 cm T2 hyperintensity LEFT maxilla, possible incisive foramen cyst. IMPRESSION: 1. Moderate to severely motion degraded examination without acute intracranial process. 2. Grossly negative noncontrast MRI of the head for age. 3. 3.1 cm LEFT maxillary mass, probable incisive foraminal cyst. Recommend inspection of roof of mouth. Electronically Signed   By: Awilda Metro M.D.   On: 07/02/2017 22:02    ASSESSMENT / PLAN:  CULTURES: 4/1 BC: viridans strep 4/4 BC :NGTD  ANTIBIOTICS: Vancomycin/zosyn>>Ceftriaxone   LINES/TUBES: ETT Foley   DISCUSSION: Daughter updated at bedside 07/03/17  ASSESSMENT / PLAN: 72 year old man with history of DM, mixed heart failure and HTN.  Admitted with altered mental status on 4/1, found to be in septic shock due to strep viridans bacteremia presumably due to severe diabetic foot ulcer of the right lower extremity status post below knee amputation.  Also with encephalopathy presumed metabolic.  PULMONARY A: Intubated for airway protection  P:   - continue low tidal volume ventilation - assess for spontaneous breathing trial this afternoon depending on results of the EEG  CARDIOVASCULAR A:  Septic shock, now resolved Systolic HF, EF 40%, unchanged from prior  P:  - restart home beta blocker and antihypertensives once BP allows. Will hold for now given severe hypotension yesterday though improving - gentle diuresis to optimize chances of successful extubation  RENAL A:   Normal renal  function  P:   - monitor electrolytes, renal indices  GASTROINTESTINAL A:   No acute issues  P:   - hold on tube feeds for now given possible extubation this afternoon  HEMATOLOGIC A:   No acute issues  P:  - DVT prophylaxis with heparin  INFECTIOUS A:   Strep viridans bacteremia  P:   - continue ceftriaxone as he has improved on this but will follow up on sensitivities once available - will need 2 week course of total antibiotics, can be transitioned to oral once all evidence of active infection improving and sensitivities are available  ENDOCRINE A:   Diabetes Mellitus   P:   lantus plus sliding scale insulin  NEUROLOGIC A:   encephalopathy  P:   RASS goal: 0 - MRI negative for acute change.  EEG performed this morning and awaiting interpretation however it seems that his encephalopathy was metabolic given the improvement after source control of his infection and improved hemodynamics.  He is not consistently following commands however he is awake, alert and appears to be able to tolerate extubation and so we will follow up with his EEG results and if negative plan for spontaneous breathing trial and extubation if able   FAMILY  - Updates: 4/6 at bedside  - Inter-disciplinary family meet or Palliative Care meeting due by:  day 7    [place] Kortland Nichols, MD Pulmonary and Critical Care Medicine [facility] Pager: [phone]  07/03/2017, 12:15  PM   

## 2017-07-03 NOTE — Progress Notes (Signed)
PT Cancellation Note  Patient Details Name: Miguel Harvey MRN: [id number] DOB: [birth year]   Cancelled Treatment:    Reason Eval/Treat Not Completed: Patient not medically ready. Pt went to OR on 4/5 for R BKA. Pt now vented and confused. Pt being set up for bedside EEG. Acute PT to return as appropriate and able to complete RE-EVal.  Lewis Shock, PT, DPT Pager #: [phone] Office #: [phone]    Wolf Boulay M Dohn Stclair 07/03/2017, 10:11 AM

## 2017-07-03 NOTE — Progress Notes (Signed)
Patient currently intubated and in ICU with EEG in place. Will sign off. [facility] will pick the patient back up when out of the ICU and extubated.

## 2017-07-03 NOTE — Progress Notes (Signed)
  Echocardiogram 2D Echocardiogram has been performed.  [name] [name] 07/03/2017, 3:52 PM

## 2017-07-03 NOTE — Progress Notes (Addendum)
NEUROHOSPITALISTS - DAILY PROGRESS NOTE  SUBJECTIVE: No family is at the bedside. Patient is found laying in bed in NAD. No new/acute events reported overnight. Patient remains intubated without sedation. Appears uncomfortable with ET tube.  Physical Examination   Vitals:   07/03/17 0800 07/03/17 0815 07/03/17 0830 07/03/17 0845  BP: (!) 145/90 130/85 128/90 (!) 148/95  Pulse: 73 75 71 73  Resp: 15 15 14 15   Temp: 98.2 F (36.8 C)     TempSrc: Axillary     SpO2: 100% 100% 100% 100%  Weight:      Height:       General - Intubated without sedation, appears in no apparent distress HEENT-  Normocephalic, Cardiovascular - Regular rate and rhythm  Respiratory - Lungs clear bilaterally. No wheezing. Abdomen - soft and non-tender, BS normal Extremities- Right BKA with wound VAC in place  Neurological Examination  Mental Status: Opens eyes when name called and attends to examiner on Left. Some neglect on Right. Makes no attempt to communicate with speech or gestures. Follows no commands. Cranial Nerves: II:  gaze is mildly disconjugate with left eye exotropic. + blink to threat bilaterally. Pupils are equal, round, and reactive to light.   III,IV, VI: Does not track examiner, does not cross midline on Right. No ptosis or obvious nystagmus.   V: unable to test VII: Facial movement is difficult to ascertain VIII: hearing appears intact to voice X: Unable to test XI: unable to test XII: +gag/cough with suctioning, +spontaneous tongue movement noted Motor: Tone is normal. Bulk is normal.  0/5 strength was present in all four extremities. Some spontaneous movement of Left Arm.  No obvious posturing. Sensor: + withdrawal to noxious stimulation, Left greater than Right Cerebellar: unable to test Gait: not tested   Laboratory Results  CBC:  Recent Labs  Lab 07/02/17 0331 07/02/17 2041 07/03/17 0521  WBC 15.5* 17.1* 11.8*  HGB 11.8* 11.8* 11.0*  HCT 35.9* 36.1* 34.1*   MCV 84.9 84.3 85.0  PLT 258 242 233   BMP: Recent Labs  Lab 06/29/17 0356 06/30/17 0325 06/30/17 2301 07/01/17 0030 07/02/17 0331 07/02/17 1420 07/02/17 1859 07/02/17 2041 07/03/17 0521  NA 134* 134*  --  132* 130*  --   --  133* 135  K 3.5 3.0* 4.2 4.0 3.7  --   --  4.6 4.2  CL 96* 93*  --  94* 93*  --   --  97* 101  CO2 23 26  --  25 25  --   --  22 24  GLUCOSE 236* 222*  --  200* 182*  --   --  223* 151*  BUN 15 10  --  14 20  --   --  22* 23*  CREATININE 0.94 0.99  --  0.97 1.13  --   --  1.26* 1.09  CALCIUM 8.2* 7.9*  --  8.2* 8.0*  --   --  7.8* 7.7*  MG 2.0 2.1 2.1  --   --   --   --   --  2.2  PHOS 2.9  --   --   --   --  4.0 4.2  --  3.5   Liver Function Tests:  Recent Labs  Lab 06/28/17 1833 06/29/17 0356  AST 46* 35  ALT 41 36  ALKPHOS 86 80  BILITOT 2.1* 1.5*  PROT 6.6 6.0*  ALBUMIN 2.5* 2.2*   Recent Labs  Lab 07/02/17 2041  AMMONIA 27   Thyroid  Function Studies:  Recent Labs    07/02/17 2041  TSH 8.407*   Cardiac Enzymes:  Recent Labs  Lab 06/28/17 1833 06/28/17 2325  TROPONINI 0.04* 0.05*    Imaging Results  Ct Head W & Wo Contrast  Result Date: 07/02/2017 CLINICAL DATA:  Altered mental status, possible metabolic encephalopathy. Assess for stroke or abscess; forefoot abscess. History of hypertension, diabetes. EXAM: CT HEAD WITHOUT AND WITH CONTRAST TECHNIQUE: Contiguous axial images were obtained from the base of the skull through the vertex without and with intravenous contrast CONTRAST:  ISOVUE-300 IOPAMIDOL (ISOVUE-300) INJECTION 61% COMPARISON:  None. FINDINGS: BRAIN: No intraparenchymal hemorrhage, mass effect nor midline shift. The ventricles and sulci are normal for age. Patchy supratentorial white matter hypodensities less than expected for patient's age, though non-specific are most compatible with chronic small vessel ischemic disease. No acute large vascular territory infarcts. No abnormal extra-axial fluid collections.  Basal cisterns are patent. No abnormal parenchymal or extra-axial enhancement. VASCULAR: Mild calcific atherosclerosis of the carotid siphons. SKULL: No skull fracture. No significant scalp soft tissue swelling. SINUSES/ORBITS: Trace LEFT mastoid effusion without air cell coalescence. Paranasal sinuses are well aerated.The included ocular globes and orbital contents are non-suspicious. OTHER: None. IMPRESSION: Negative CT HEAD with and without contrast for age. Electronically Signed   By: Awilda Metro M.D.   On: 07/02/2017 16:01   Dg Chest Port 1 View  Result Date: 07/02/2017 CLINICAL DATA:  Endotracheal tube placement. EXAM: PORTABLE CHEST 1 VIEW COMPARISON:  Radiographs of June 28, 2017. FINDINGS: Stable cardiomediastinal silhouette. Endotracheal tube is seen projected over tracheal air shadow with distal tip 3 cm above the carina. No pneumothorax or pleural effusion is noted. No acute pulmonary disease is noted. Bony thorax is unremarkable. IMPRESSION: Endotracheal tube in grossly good position. No acute cardiopulmonary abnormality seen. Electronically Signed   By: Lupita Raider, M.D.   On: 07/02/2017 12:52   Dg Abd Portable 1v  Result Date: 07/02/2017 CLINICAL DATA:  Orogastric tube placement EXAM: PORTABLE ABDOMEN - 1 VIEW COMPARISON:  None. FINDINGS: In orogastric tube coils once in the stomach and terminates in the stomach body oriented towards the fundus. Thoracolumbar spondylosis. IMPRESSION: 1. OG tube tip: Stomach body, oriented towards the fundus. Electronically Signed   By: Gaylyn Rong M.D.   On: 07/02/2017 19:36   Mr Brain Limited Wo Contrast  Result Date: 07/02/2017 CLINICAL DATA:  Altered level of consciousness, somnolent. Evaluate encephalopathy. History of forefoot abscess, hypertension, hyperlipidemia. EXAM: MRI HEAD WITHOUT CONTRAST TECHNIQUE: Multiplanar, multiecho pulse sequences of the brain and surrounding structures were obtained without intravenous contrast.  COMPARISON:  CT HEAD July 02, 2017. FINDINGS: Sequences are moderately to severely motion degraded. INTRACRANIAL CONTENTS: No reduced diffusion to suggest acute ischemia, typical infection or hypercellular tumor. Nondiagnostic coronal diffusion weighted imaging. Nondiagnostic hemosiderin sensitive sequence. Patchy to confluent supratentorial white matter FLAIR T2 hyperintensities. The ventricles and sulci are normal for patient's age. No suspicious parenchymal signal, masses, mass effect. No abnormal extra-axial fluid collections. No extra-axial masses. VASCULAR: Normal major intracranial vascular flow voids present at skull base. SKULL AND UPPER CERVICAL SPINE: Nondiagnostic assessment due to severely motion degraded sagittal T1. SINUSES/ORBITS: The mastoid air-cells and included paranasal sinuses are well-aerated.The included ocular globes and orbital contents are non-suspicious. OTHER: 3.1 cm T2 hyperintensity LEFT maxilla, possible incisive foramen cyst. IMPRESSION: 1. Moderate to severely motion degraded examination without acute intracranial process. 2. Grossly negative noncontrast MRI of the head for age. 3. 3.1 cm LEFT maxillary mass, probable  incisive foraminal cyst. Recommend inspection of roof of mouth. Electronically Signed   By: Awilda Metro M.D.   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 All imaging thus far negative for acute findings.  No evidence of seizure activity thus far. More alert on exam this morning but continues to not follow any commands. Will continue to monitor closely.  Impression: Toxic metabolic encephalopathy in the setting of severe sepsis and hypotension Evaluate for underlying seizures  Outstanding Work-up Studies:     EEG                                                               PENDING  07/03/2017 PLAN  -EEG  -Correction of toxic metabolic derangements - per primary team    Hospital day # 5 FAMILY UPDATES: No family at bedside  TEAM UPDATES: Lynnell Catalan, MD    Assessment & plan discussed with with attending physician and they are in agreement.    Mary A Costello. ANP-C [facility] 07/03/2017, 9:11 AM  Will continue to follow  07/03/2017:  ATTENDING ASSESSMENT   I have seen the patient and reviewed the note.  Likely toxic/metabolic encephalopathy/delirium in the setting of infection.  He did have an episode of hypotension, requiring hypotension and chest compressions.  He has been minimally responsive since that time.  It is possible that there is a hypoxic/ischemic component to his encephalopathy.  If so, I would not favor limiting his care based on this has he is already having enough improvement to suggest that he has potential for good recovery.  I think more likely it is multifactorial with possibly hypoxic is 1 component.  His TSH is elevated, but was normal 4 days ago so this is probably not significant.  Ammonia levels are normal.  I would favor continued management per internal medicine/CCM.  Ritta Slot, MD [facility] [phone]  If 7pm- 7am, please page neurology on call as listed in AMION.

## 2017-07-03 NOTE — Procedures (Signed)
History: 72 yo M with encephalopathy  Sedation: None  Technique: This is a 21 channel routine scalp EEG performed at the bedside with bipolar and monopolar montages arranged in accordance to the international 10/20 system of electrode placement. One channel was dedicated to EKG recording.    Background: There is a posterior dominant rhythm of 7 Hz which is moderately well organized.  In addition, even during maximal wakefulness there is generalized irregular delta and theta activities.  There is no epileptiform activity or seizure recorded.  Photic stimulation: Physiologic driving is not performed  EEG Abnormalities: 1) generalized irregular slow activity 2) slow PDR  Clinical Interpretation: This EEG is consistent with a mild generalized non-specific cerebral dysfunction(encephalopathy). There was no seizure or seizure predisposition recorded on this study. Please note that a normal EEG does not preclude the possibility of epilepsy.   Ritta Slot, MD [facility] [phone]  If 7pm- 7am, please page neurology on call as listed in AMION.

## 2017-07-03 NOTE — Progress Notes (Signed)
   Subjective:  Patient is intubated and sedated  Objective:   VITALS:   Vitals:   07/03/17 0426 07/03/17 0500 07/03/17 0600 07/03/17 0700  BP:  121/77 131/81 122/79  Pulse:  65 69 65  Resp:  15 15 15   Temp:      TempSrc:      SpO2:  100% 100% 100%  Weight: 92 kg (202 lb 13.2 oz)     Height:       VAC with good seal and suction Compartments soft  Lab Results  Component Value Date   WBC 11.8 (H) 07/03/2017   HGB 11.0 (L) 07/03/2017   HCT 34.1 (L) 07/03/2017   MCV 85.0 07/03/2017   PLT 233 07/03/2017     Assessment/Plan:  1 Day Post-Op   - NWB RLE - continue iVAC for 1 week - dc to SNF when stable - f/u with Dr. Lajoyce Corners in 1 week in office  Miguel Harvey 07/03/2017, 7:57 AM [phone]

## 2017-07-03 NOTE — Progress Notes (Signed)
EEG completed; results pending.    

## 2017-07-03 NOTE — Progress Notes (Signed)
Nutrition Follow-up  DOCUMENTATION CODES:   Not applicable  INTERVENTION:  - Will order TF: Vital AF 1.2 @ 70 mL/hr which will provide 2016 kcal (103% estimated kcal need), 126 grams of protein, and 1362 mL free water.  - Will change multivitamin to liquid per OGT.   NUTRITION DIAGNOSIS:   Increased nutrient needs related to wound healing as evidenced by estimated needs. -ongoing  GOAL:   Patient will meet greater than or equal to 90% of their needs -unmet at this time.   MONITOR:   Vent status, TF tolerance, Weight trends, Labs, Skin  REASON FOR ASSESSMENT:   Ventilator, Consult Enteral/tube feeding initiation and management  ASSESSMENT:   72 yo male with PMH of HTN, DM, CHF, and smoker who was admitted on 4/1 with generalized weakness and inability to walk. 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 Pt remains intubated s/p procedure and TF consult placed.  Weight -6 lbs/2.7 kg from 4/2-4/6. Used weight from today (92 kg) in re-estimating needs. BMI now indicates overweight rather than obese status. Pt remains intubated s/p BKA yesterday and OGT in place. Reviewed abdominal x-ray results report from yesterday which states that "OG tube tip: stomach body, oriented towards the fundus." OGT clamped at this time. Last RD note on 4/2 indicated good appetite and PO intakes.   Patient is currently intubated on ventilator support MV: 8.5 L/min Temp (24hrs), Avg:98.5 F (36.9 C), Min:97.9 F (36.6 C), Max:99.5 F (37.5 C) Propofol: none BP: 130/96 and MAP: 107  Medications reviewed; 100 mg Colace BID, sliding scale Novolog, 10 units Lantus/day, daily multivitamin with minerals.  Labs reviewed; CBG: 226 mg/dL, BUN: 23 mg/dL, Ca: 7.7 mg/dL,   IVF: NS @ 50 mL/hr. No drips at this time.      Diet Order:  Diet NPO time specified  EDUCATION NEEDS:   Not appropriate for education  at this time  Skin:  Skin Assessment: Skin Integrity Issues: Skin Integrity Issues:: Incisions Incisions: R BKA with wound vac 4/5 Other: R toe wound  Last BM:  4/3  Height:   Ht Readings from Last 1 Encounters:  07/02/17 6' (1.829 m)    Weight:   Wt Readings from Last 1 Encounters:  07/03/17 202 lb 13.2 oz (92 kg)    Ideal Body Weight:  72.7 kg  BMI:  Body mass index is 27.51 kg/m.  Estimated Nutritional Needs:   Kcal:  1958  Protein:  120-138 grams (1.3-1.5 grams/kg)  Fluid:  2 L      Trenton Gammon, MS, RD, LDN, [facility] Inpatient Clinical Dietitian Pager # [phone] After hours/weekend pager # [phone]

## 2017-07-04 LAB — GLUCOSE, CAPILLARY
GLUCOSE-CAPILLARY: 203 mg/dL — AB (ref 65–99)
GLUCOSE-CAPILLARY: 129 mg/dL — AB (ref 65–99)
GLUCOSE-CAPILLARY: 127 mg/dL — AB (ref 65–99)
GLUCOSE-CAPILLARY: 138 mg/dL — AB (ref 65–99)
GLUCOSE-CAPILLARY: 149 mg/dL — AB (ref 65–99)
Glucose-Capillary: 126 mg/dL — ABNORMAL HIGH (ref 65–99)

## 2017-07-04 MED ORDER — FENTANYL CITRATE (PF) 100 MCG/2ML IJ SOLN
50.0000 ug | INTRAMUSCULAR | Status: DC | PRN
  Administered 2017-07-04 – 2017-07-06 (×14): 50 ug via INTRAVENOUS
  Filled 2017-07-04 (×14): qty 2

## 2017-07-04 MED ORDER — DOCUSATE SODIUM 50 MG/5ML PO LIQD
100.0000 mg | Freq: Two times a day (BID) | ORAL | Status: DC
  Administered 2017-07-04 – 2017-07-05 (×3): 100 mg
  Filled 2017-07-04 (×4): qty 10

## 2017-07-04 MED ORDER — DEXMEDETOMIDINE HCL IN NACL 200 MCG/50ML IV SOLN
0.4000 ug/kg/h | INTRAVENOUS | Status: DC
  Administered 2017-07-04: 0.6 ug/kg/h via INTRAVENOUS
  Administered 2017-07-04: 0.4 ug/kg/h via INTRAVENOUS
  Administered 2017-07-04: 0.5 ug/kg/h via INTRAVENOUS
  Administered 2017-07-04 – 2017-07-05 (×4): 0.7 ug/kg/h via INTRAVENOUS
  Administered 2017-07-05: 0.5 ug/kg/h via INTRAVENOUS
  Administered 2017-07-05: 0.6 ug/kg/h via INTRAVENOUS
  Administered 2017-07-05: 0.4 ug/kg/h via INTRAVENOUS
  Administered 2017-07-05: 0.6 ug/kg/h via INTRAVENOUS
  Administered 2017-07-06: 0.4 ug/kg/h via INTRAVENOUS
  Filled 2017-07-04 (×11): qty 50

## 2017-07-04 MED ORDER — HALOPERIDOL LACTATE 5 MG/ML IJ SOLN
2.0000 mg | Freq: Once | INTRAMUSCULAR | Status: AC
  Administered 2017-07-04: 2 mg via INTRAVENOUS

## 2017-07-04 MED ORDER — HALOPERIDOL LACTATE 5 MG/ML IJ SOLN
INTRAMUSCULAR | Status: AC
  Filled 2017-07-04: qty 1

## 2017-07-04 NOTE — Progress Notes (Signed)
Patient placed on PS/CPAP at 5/5. MD at bedside. Patient agitated and confused with inconsistent Respiratory pattern. Will monitor closely for SBT failure.

## 2017-07-04 NOTE — Progress Notes (Addendum)
NEUROHOSPITALISTS - DAILY PROGRESS NOTE  SUBJECTIVE: Again, no family is at the bedside. Patient is found laying in bed in NAD. No new/acute events reported overnight. Patient remains intubated with PRN sedation for agitation. Appears uncomfortable with ET tube. Per RN may attempt to extubate later today  Physical Examination   Vitals:   07/04/17 0530 07/04/17 0600 07/04/17 0630 07/04/17 0700  BP: 108/69 115/70 117/69 113/83  Pulse: 68 70 70 (!) 56  Resp: 15 15 15 15   Temp:      TempSrc:      SpO2: 100% 100% 100% 100%  Weight:      Height:       General - Intubated with PRN sedation, appears in no apparent distress HEENT-  Normocephalic, Cardiovascular - Regular rate and rhythm  Respiratory - Lungs clear bilaterally. No wheezing. Abdomen - soft and non-tender, BS normal Extremities- Right BKA with wound VAC in place  Neurological Examination  Mental Status: Opens eyes when name called and attends to examiner on Left. Some neglect noted on Right. Makes no attempt to communicate with speech or gestures. Follows no commands. Cranial Nerves: II:  gaze is mildly disconjugate with left eye exotropic. +/- blink to threat bilaterally. Pupils are equal, round, and reactive to light.   III,IV, VI: Appeared to track examiner this morning and was able to cross midline on Right. Not able to repeat testing. No ptosis or obvious nystagmus.   V: unable to test VII: Facial movement is difficult to ascertain VIII: hearing appears intact to voice X: Unable to test XI: unable to test XII: +gag/cough with suctioning, +spontaneous tongue movement noted Motor: Tone is normal. Bulk is normal.  Spontaneously, intermittently, moving all extremities this morning. Left UE more than other extremities. Not cooperative for strength testing. No obvious posturing. Sensor: + withdrawal to noxious stimulation, Left greater than Right Cerebellar: unable to test Gait: not tested   Laboratory  Results  CBC:  Recent Labs  Lab 07/02/17 0331 07/02/17 2041 07/03/17 0521  WBC 15.5* 17.1* 11.8*  HGB 11.8* 11.8* 11.0*  HCT 35.9* 36.1* 34.1*  MCV 84.9 84.3 85.0  PLT 258 242 233   BMP: Recent Labs  Lab 06/29/17 0356 06/30/17 0325 06/30/17 2301 07/01/17 0030 07/02/17 0331 07/02/17 1420 07/02/17 1859 07/02/17 2041 07/03/17 0521 07/03/17 1633  NA 134* 134*  --  132* 130*  --   --  133* 135  --   K 3.5 3.0* 4.2 4.0 3.7  --   --  4.6 4.2  --   CL 96* 93*  --  94* 93*  --   --  97* 101  --   CO2 23 26  --  25 25  --   --  22 24  --   GLUCOSE 236* 222*  --  200* 182*  --   --  223* 151*  --   BUN 15 10  --  14 20  --   --  22* 23*  --   CREATININE 0.94 0.99  --  0.97 1.13  --   --  1.26* 1.09  --   CALCIUM 8.2* 7.9*  --  8.2* 8.0*  --   --  7.8* 7.7*  --   MG 2.0 2.1 2.1  --   --   --   --   --  2.2  --   PHOS 2.9  --   --   --   --  4.0 4.2  --  3.5 3.3  Liver Function Tests:  Recent Labs  Lab 06/28/17 1833 06/29/17 0356  AST 46* 35  ALT 41 36  ALKPHOS 86 80  BILITOT 2.1* 1.5*  PROT 6.6 6.0*  ALBUMIN 2.5* 2.2*   Recent Labs  Lab 07/02/17 2041  AMMONIA 27   Thyroid Function Studies:  Recent Labs    07/02/17 2041  TSH 8.407*   Cardiac Enzymes:  Recent Labs  Lab 06/28/17 1833 06/28/17 2325  TROPONINI 0.04* 0.05*    Imaging Results  Ct Head W & Wo Contrast  Result Date: 07/02/2017 CLINICAL DATA:  Altered mental status, possible metabolic encephalopathy. Assess for stroke or abscess; forefoot abscess. History of hypertension, diabetes. EXAM: CT HEAD WITHOUT AND WITH CONTRAST TECHNIQUE: Contiguous axial images were obtained from the base of the skull through the vertex without and with intravenous contrast CONTRAST:  ISOVUE-300 IOPAMIDOL (ISOVUE-300) INJECTION 61% COMPARISON:  None. FINDINGS: BRAIN: No intraparenchymal hemorrhage, mass effect nor midline shift. The ventricles and sulci are normal for age. Patchy supratentorial white matter  hypodensities less than expected for patient's age, though non-specific are most compatible with chronic small vessel ischemic disease. No acute large vascular territory infarcts. No abnormal extra-axial fluid collections. Basal cisterns are patent. No abnormal parenchymal or extra-axial enhancement. VASCULAR: Mild calcific atherosclerosis of the carotid siphons. SKULL: No skull fracture. No significant scalp soft tissue swelling. SINUSES/ORBITS: Trace LEFT mastoid effusion without air cell coalescence. Paranasal sinuses are well aerated.The included ocular globes and orbital contents are non-suspicious. OTHER: None. IMPRESSION: Negative CT HEAD with and without contrast for age. Electronically Signed   By: Awilda Metro M.D.   On: 07/02/2017 16:01   Dg Chest Port 1 View  Result Date: 07/02/2017 CLINICAL DATA:  Endotracheal tube placement. EXAM: PORTABLE CHEST 1 VIEW COMPARISON:  Radiographs of June 28, 2017. FINDINGS: Stable cardiomediastinal silhouette. Endotracheal tube is seen projected over tracheal air shadow with distal tip 3 cm above the carina. No pneumothorax or pleural effusion is noted. No acute pulmonary disease is noted. Bony thorax is unremarkable. IMPRESSION: Endotracheal tube in grossly good position. No acute cardiopulmonary abnormality seen. Electronically Signed   By: Lupita Raider, M.D.   On: 07/02/2017 12:52   Dg Abd Portable 1v  Result Date: 07/02/2017 CLINICAL DATA:  Orogastric tube placement EXAM: PORTABLE ABDOMEN - 1 VIEW COMPARISON:  None. FINDINGS: In orogastric tube coils once in the stomach and terminates in the stomach body oriented towards the fundus. Thoracolumbar spondylosis. IMPRESSION: 1. OG tube tip: Stomach body, oriented towards the fundus. Electronically Signed   By: Gaylyn Rong M.D.   On: 07/02/2017 19:36   Mr Brain Limited Wo Contrast  Result Date: 07/02/2017 CLINICAL DATA:  Altered level of consciousness, somnolent. Evaluate encephalopathy. History of  forefoot abscess, hypertension, hyperlipidemia. EXAM: MRI HEAD WITHOUT CONTRAST TECHNIQUE: Multiplanar, multiecho pulse sequences of the brain and surrounding structures were obtained without intravenous contrast. COMPARISON:  CT HEAD July 02, 2017. FINDINGS: Sequences are moderately to severely motion degraded. INTRACRANIAL CONTENTS: No reduced diffusion to suggest acute ischemia, typical infection or hypercellular tumor. Nondiagnostic coronal diffusion weighted imaging. Nondiagnostic hemosiderin sensitive sequence. Patchy to confluent supratentorial white matter FLAIR T2 hyperintensities. The ventricles and sulci are normal for patient's age. No suspicious parenchymal signal, masses, mass effect. No abnormal extra-axial fluid collections. No extra-axial masses. VASCULAR: Normal major intracranial vascular flow voids present at skull base. SKULL AND UPPER CERVICAL SPINE: Nondiagnostic assessment due to severely motion degraded sagittal T1. SINUSES/ORBITS: The mastoid air-cells and included paranasal sinuses  are well-aerated.The included ocular globes and orbital contents are non-suspicious. OTHER: 3.1 cm T2 hyperintensity LEFT maxilla, possible incisive foramen cyst. IMPRESSION: 1. Moderate to severely motion degraded examination without acute intracranial process. 2. Grossly negative noncontrast MRI of the head for age. 3. 3.1 cm LEFT maxillary mass, probable incisive foraminal cyst. Recommend inspection of roof of mouth. Electronically Signed   By: Awilda Metro M.D.   On: 07/02/2017 22:02   EEG 07/03/2017 EEG Abnormalities: 1) generalized irregular slow activity 2) slow PDR Clinical Interpretation: This EEG is consistent with a mild generalized non-specific cerebral dysfunction(encephalopathy). There was no seizure or seizure predisposition recorded on this study. Please note that a normal EEG does not preclude the possibility of epilepsy.   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 All imaging thus far negative for acute findings.  EEG negative for seizure activity and no evidence of seizure activity documented thus far. Remains intermittently alert on exam but continues to not follow any commands.   Impression: Toxic metabolic encephalopathy/delirium in the setting of severe sepsis and hypotension  07/04/2017 PLAN  Will continue to monitor closely for now Limit sedation and avoid Hypotension, if possible    Hospital day # 6 FAMILY UPDATES: No family at bedside  TEAM UPDATES: Lynnell Catalan, MD    Assessment & plan discussed with with attending physician and they are in agreement.    Mary A Costello. ANP-C [facility] 07/04/2017, 7:57 AM  Will continue to follow  07/04/2017:  ATTENDING ASSESSMENT   I have seen the patient and reviewed the note.  He continues to be encephalopathic.  He does fixate and track, but does not blink to threat.  He does not follow commands.  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  Given that his exam is Artie improving, I would expect continued gradual improvement.   Ritta Slot, MD [facility] [phone]  If 7pm- 7am, please page neurology on call as listed in AMION.

## 2017-07-04 NOTE — Progress Notes (Signed)
PT Cancellation Note  Patient Details Name: PRESSLEY DEVON MRN: [id number] DOB: 04-10-1945   Cancelled Treatment:    Reason Eval/Treat Not Completed: Patient not medically ready. Pt very confused and trying to wean of intubation. RN deferred at this time. PT to return when pt medically stable.  Lewis Shock, PT, DPT Pager #: [phone] Office #: [phone]    Rozell Searing Rhian Funari 07/04/2017, 10:32 AM

## 2017-07-04 NOTE — Progress Notes (Signed)
Clinical Social Worker met patient at bedside upon the request of patients daughter. CSW met daughter and son in law at bedside. Family expressed there concerns about patient returning home after discharge. CSW informed them that prior to patients amputation, patient agreed to discharge to SNF once medically cleared. Family stated they agreed with patient going to rehab but also stated they think he would benefit from a long term stay. CSW answered family's questions about long term care and advised them to look into medicaid.   Rhea Pink, MSW,  [place]

## 2017-07-04 NOTE — Progress Notes (Signed)
eLink Physician-Brief Progress Note Patient Name: Miguel Harvey DOB: January 24, 1946 MRN: [id number]   Date of Service  07/04/2017  HPI/Events of Note  Delirium - QTc interval = 0.39 seconds.   eICU Interventions  Will order: 1. Haldol 2 mg IV now.  2. Increase Fentanyl IV to 50 mcg IV Q 1 hour PRN.      Intervention Category Major Interventions: Delirium, psychosis, severe agitation - evaluation and management  Sommer,Steven Eugene 07/04/2017, 3:34 AM

## 2017-07-04 NOTE — Progress Notes (Signed)
Name: Miguel Harvey MRN: [id number] DOB: [birth year]    ADMISSION DATE:  06/28/2017 CONSULTATION DATE:  07/02/17  CHIEF COMPLAINT:  confusion  BRIEF PATIENT DESCRIPTION:  72 year old male with severe diabetic foot infection, septic shock and encephalopathy    SIGNIFICANT EVENTS  07/02/17: underwent  transtibial amputation for gangrenous ulcers with abscess right forefoot.  Hypotension in OR requiring epinephrine following induction of anesthesia. Remained intubated and minimally responsive. Admitted to MICU  STUDIES:  MRI Brain 07/02/17: no acute changes   HISTORY OF PRESENT ILLNESS:  72 year old man with history of DM, mixed heart failure and HTN.  Admitted with altered mental status on 4/1. Found crawling on the floor of his home. Generalized weakness for past 2 days. History of recurrent falls. Poor follow-up for DM, reported symptoms of peripheral neuropathy.    Increasing obtundation while in hospital and found to have severe diabetic foot infection with gangrene as cause of sepsis with hypotension, tachycardia, leukocytosis and elevated lactate.   Underwent transtibial amputation for gangrenous ulcers with abscess right forefoot.  Hypotension in OR requiring epinephrine following induction of anesthesia. Remained intubated and minimally responsive.  4/7 The patient is awake and eyes are open. He is intemrittently agitated. At time she is having apneic spells on CPAP trial. His tidal volunes appear to be pretty gpood and consistently >500cc.   PAST MEDICAL HISTORY :   has a past medical history of CHF (congestive heart failure) (HCC), Diabetes mellitus, and Hypertension.  has a past surgical history that includes cyst removal from  right leg about 6 years ago; left heart catheterization with coronary angiogram (N/A, 05/11/2011); and Amputation (Right, 07/02/2017). Prior to Admission medications   Medication Sig Start Date End Date Taking? Authorizing Provider  aspirin 325 MG  tablet Take 325 mg by mouth daily.   Yes [provider]  atorvastatin (LIPITOR) 40 MG tablet take 1 tablet by mouth once daily Patient not taking: Reported on 12/23/2016 12/30/15   Beaulah Dinning, MD  carvedilol (COREG) 6.25 MG tablet take 1 tablet by mouth twice a day with meals Patient not taking: Reported on 12/23/2016 05/01/14   Briscoe Deutscher, DO  enalapril-hydrochlorothiazide (VASERETIC) 10-25 MG tablet take 1 tablet by mouth once daily Patient not taking: Reported on 12/23/2016 01/01/16   Beaulah Dinning, MD  metFORMIN (GLUCOPHAGE) 1000 MG tablet Take 0.5 tablets (500 mg total) by mouth 2 (two) times daily with a meal. Patient not taking: Reported on 12/23/2016 09/02/12   Brent Bulla, MD       VITAL SIGNS: Temp:  [98 F (36.7 C)-99.4 F (37.4 C)] 98.9 F (37.2 C) (04/07 0808) Pulse Rate:  [56-90] 56 (04/07 0700) Resp:  [15-20] 15 (04/07 0700) BP: (100-149)/(63-104) 113/83 (04/07 0700) SpO2:  [87 %-100 %] 87 % (04/07 0922) FiO2 (%):  [30 %] 30 % (04/07 0922) Weight:  [200 lb 2.8 oz (90.8 kg)] 200 lb 2.8 oz (90.8 kg) (04/07 0506)  PHYSICAL EXAMINATION: General: the patient is intermittenmtly restless Cor:: RRR s1 S2 Chest bilateral BS , no major wheeze or rales.  Abdo: Large midline ventral hernia. EXTR: rIGHT bka WITH VACUUM DRESSING NEURO: HE IS AWAKE AND APPEAR TO BE MOVING ALL EXTREMITIES. HE WILL NOT FOLLOW  OCMMANDS AND IS AGIOTATED. Wea are a little concerned he may self -extubated so we may need to restrain him.    Recent Labs  Lab 07/02/17 0331 07/02/17 2041 07/03/17 0521  NA 130* 133* 135  K  3.7 4.6 4.2  CL 93* 97* 101  CO2 25 22 24   BUN 20 22* 23*  CREATININE 1.13 1.26* 1.09  GLUCOSE 182* 223* 151*   Recent Labs  Lab 07/02/17 0331 07/02/17 2041 07/03/17 0521  HGB 11.8* 11.8* 11.0*  HCT 35.9* 36.1* 34.1*  WBC 15.5* 17.1* 11.8*  PLT 258 242 233   Ct Head W & Wo Contrast  Result Date: 07/02/2017 CLINICAL DATA:  Altered mental status,  possible metabolic encephalopathy. Assess for stroke or abscess; forefoot abscess. History of hypertension, diabetes. EXAM: CT HEAD WITHOUT AND WITH CONTRAST TECHNIQUE: Contiguous axial images were obtained from the base of the skull through the vertex without and with intravenous contrast CONTRAST:  ISOVUE-300 IOPAMIDOL (ISOVUE-300) INJECTION 61% COMPARISON:  None. FINDINGS: BRAIN: No intraparenchymal hemorrhage, mass effect nor midline shift. The ventricles and sulci are normal for age. Patchy supratentorial white matter hypodensities less than expected for patient's age, though non-specific are most compatible with chronic small vessel ischemic disease. No acute large vascular territory infarcts. No abnormal extra-axial fluid collections. Basal cisterns are patent. No abnormal parenchymal or extra-axial enhancement. VASCULAR: Mild calcific atherosclerosis of the carotid siphons. SKULL: No skull fracture. No significant scalp soft tissue swelling. SINUSES/ORBITS: Trace LEFT mastoid effusion without air cell coalescence. Paranasal sinuses are well aerated.The included ocular globes and orbital contents are non-suspicious. OTHER: None. IMPRESSION: Negative CT HEAD with and without contrast for age. Electronically Signed   By: Awilda Metro M.D.   On: 07/02/2017 16:01   Dg Chest Port 1 View  Result Date: 07/02/2017 CLINICAL DATA:  Endotracheal tube placement. EXAM: PORTABLE CHEST 1 VIEW COMPARISON:  Radiographs of June 28, 2017. FINDINGS: Stable cardiomediastinal silhouette. Endotracheal tube is seen projected over tracheal air shadow with distal tip 3 cm above the carina. No pneumothorax or pleural effusion is noted. No acute pulmonary disease is noted. Bony thorax is unremarkable. IMPRESSION: Endotracheal tube in grossly good position. No acute cardiopulmonary abnormality seen. Electronically Signed   By: Lupita Raider, M.D.   On: 07/02/2017 12:52   Dg Abd Portable 1v  Result Date:  07/02/2017 CLINICAL DATA:  Orogastric tube placement EXAM: PORTABLE ABDOMEN - 1 VIEW COMPARISON:  None. FINDINGS: In orogastric tube coils once in the stomach and terminates in the stomach body oriented towards the fundus. Thoracolumbar spondylosis. IMPRESSION: 1. OG tube tip: Stomach body, oriented towards the fundus. Electronically Signed   By: Gaylyn Rong M.D.   On: 07/02/2017 19:36   Mr Brain Limited Wo Contrast  Result Date: 07/02/2017 CLINICAL DATA:  Altered level of consciousness, somnolent. Evaluate encephalopathy. History of forefoot abscess, hypertension, hyperlipidemia. EXAM: MRI HEAD WITHOUT CONTRAST TECHNIQUE: Multiplanar, multiecho pulse sequences of the brain and surrounding structures were obtained without intravenous contrast. COMPARISON:  CT HEAD July 02, 2017. FINDINGS: Sequences are moderately to severely motion degraded. INTRACRANIAL CONTENTS: No reduced diffusion to suggest acute ischemia, typical infection or hypercellular tumor. Nondiagnostic coronal diffusion weighted imaging. Nondiagnostic hemosiderin sensitive sequence. Patchy to confluent supratentorial white matter FLAIR T2 hyperintensities. The ventricles and sulci are normal for patient's age. No suspicious parenchymal signal, masses, mass effect. No abnormal extra-axial fluid collections. No extra-axial masses. VASCULAR: Normal major intracranial vascular flow voids present at skull base. SKULL AND UPPER CERVICAL SPINE: Nondiagnostic assessment due to severely motion degraded sagittal T1. SINUSES/ORBITS: The mastoid air-cells and included paranasal sinuses are well-aerated.The included ocular globes and orbital contents are non-suspicious. OTHER: 3.1 cm T2 hyperintensity LEFT maxilla, possible incisive foramen cyst. IMPRESSION: 1. Moderate  to severely motion degraded examination without acute intracranial process. 2. Grossly negative noncontrast MRI of the head for age. 3. 3.1 cm LEFT maxillary mass, probable incisive  foraminal cyst. Recommend inspection of roof of mouth. Electronically Signed   By: Awilda Metro M.D.   On: 07/02/2017 22:02    ASSESSMENT / PLAN:  CULTURES: 4/1 BC: viridans strep 4/4 BC :NGTD  ANTIBIOTICS: Vancomycin/zosyn>>Ceftriaxone   LINES/TUBES: ETT Foley   ASSESSMENT / PLAN: 72 year old man with history of DM, mixed heart failure and HTN.  Admitted with altered mental status on 4/1, found to be in septic shock due to strep viridans bacteremia presumably due to severe diabetic foot ulcer of the right lower extremity status post below knee amputation.  Also with encephalopathy presumed metabolic.  PULMONARY A: Intubated for airway protection We were hpoing to extubate the patient but he is having intermittent apneic spells. When we extubate him we may need toi keep him oN precededx given his agitation   CARDIOVASCULAR A:  Septic shock, now resolved Systolic HF, EF 40%, unchanged from prior  P:  - restart home beta blocker and antihypertensives once BP allows. Will hold for now given severe hypotension yesterday though improving - gentle diuresis to optimize chances of successful extubation  RENAL A:   Normal renal function  P:   - monitor electrolytes, renal indices  GASTROINTESTINAL A:   No acute issues     INFECTIOUS A:   Strep viridans bacteremia  P:   - continue ceftriaxone as he has improved on this but will follow up on sensitivities once available - will need 2 week course of total antibiotics, can be transitioned to oral once all evidence of active infection improving and sensitivities are available  ENDOCRINE A:   Diabetes Mellitus   P:   lantus plus sliding scale insulin.TSH was a little elevated. Will check  Free t4  NEUROLOGIC He is grossly enecephalopathic. May still be residual of his sepsis.MRI and EEG were grossly unremarkable.       07/04/2017, 10:01 AM

## 2017-07-04 NOTE — Progress Notes (Signed)
SBT aborted due to either excessive agitation and tachypnea or Apnea into backup mode. Back on full vent support. No distress noted.

## 2017-07-05 DIAGNOSIS — G9341 Metabolic encephalopathy: Secondary | ICD-10-CM

## 2017-07-05 LAB — GLUCOSE, CAPILLARY
GLUCOSE-CAPILLARY: 252 mg/dL — AB (ref 65–99)
GLUCOSE-CAPILLARY: 218 mg/dL — AB (ref 65–99)
GLUCOSE-CAPILLARY: 280 mg/dL — AB (ref 65–99)
Glucose-Capillary: 207 mg/dL — ABNORMAL HIGH (ref 65–99)
Glucose-Capillary: 190 mg/dL — ABNORMAL HIGH (ref 65–99)

## 2017-07-05 LAB — CULTURE, BLOOD (ROUTINE X 2): Special Requests: ADEQUATE

## 2017-07-05 LAB — T4, FREE: FREE T4: 0.94 ng/dL (ref 0.61–1.12)

## 2017-07-05 MED ORDER — ORAL CARE MOUTH RINSE: 15.0000 mL | Freq: Four times a day (QID) | OROMUCOSAL | Status: DC

## 2017-07-05 MED ORDER — INSULIN ASPART 100 UNIT/ML ~~LOC~~ SOLN: 3.0000 [IU] | SUBCUTANEOUS | Status: DC

## 2017-07-05 MED ORDER — INSULIN ASPART 100 UNIT/ML ~~LOC~~ SOLN
0.0000 [IU] | Freq: Three times a day (TID) | SUBCUTANEOUS | Status: DC
  Administered 2017-07-05: 11 [IU] via SUBCUTANEOUS
  Administered 2017-07-06: 3 [IU] via SUBCUTANEOUS
  Administered 2017-07-06: 4 [IU] via SUBCUTANEOUS
  Administered 2017-07-07 (×2): 3 [IU] via SUBCUTANEOUS
  Administered 2017-07-08 (×2): 4 [IU] via SUBCUTANEOUS
  Administered 2017-07-09 (×2): 3 [IU] via SUBCUTANEOUS
  Administered 2017-07-09: 4 [IU] via SUBCUTANEOUS
  Administered 2017-07-10: 3 [IU] via SUBCUTANEOUS

## 2017-07-05 MED ORDER — CHLORHEXIDINE GLUCONATE 0.12% ORAL RINSE (MEDLINE KIT)
15.0000 mL | Freq: Two times a day (BID) | OROMUCOSAL | Status: DC
  Administered 2017-07-05: 15 mL via OROMUCOSAL

## 2017-07-05 MED ORDER — INSULIN ASPART 100 UNIT/ML ~~LOC~~ SOLN: 0.0000 [IU] | Freq: Every day | SUBCUTANEOUS | Status: DC

## 2017-07-05 NOTE — Progress Notes (Signed)
Name: Miguel Harvey MRN: [id number] DOB: 10/06/1945    ADMISSION DATE:  06/28/2017 CONSULTATION DATE:  07/02/17  CHIEF COMPLAINT:  confusion  BRIEF PATIENT DESCRIPTION:  72 year old male with severe diabetic foot infection, septic shock and encephalopathy    SIGNIFICANT EVENTS  07/02/17: underwent  transtibial amputation for gangrenous ulcers with abscess right forefoot.  Hypotension in OR requiring epinephrine following induction of anesthesia. Remained intubated and minimally responsive. Admitted to MICU  STUDIES:  MRI Brain 07/02/17: no acute changes   HISTORY OF PRESENT ILLNESS:  72 year old man with history of DM, mixed heart failure and HTN.  Admitted with altered mental status on 4/1. Found crawling on the floor of his home. Generalized weakness for past 2 days. History of recurrent falls. Poor follow-up for DM, reported symptoms of peripheral neuropathy.    Increasing obtundation while in hospital and found to have severe diabetic foot infection with gangrene as cause of sepsis with hypotension, tachycardia, leukocytosis and elevated lactate.   Underwent transtibial amputation for gangrenous ulcers with abscess right forefoot.  Hypotension in OR requiring epinephrine following induction of anesthesia. Remained intubated and minimally responsive.  4/7 The patient is awake and eyes are open. He is intemrittently agitated. At time she is having apneic spells on CPAP trial. His tidal volunes appear to be pretty gpood and consistently >500cc.  4/8 The patient is awake. It appears that he has a diffcult time focusing and following commands. From a purely respiratory viewpt he appears ready for extubation.    PAST MEDICAL HISTORY :   has a past medical history of CHF (congestive heart failure) (HCC), Diabetes mellitus, and Hypertension.  has a past surgical history that includes cyst removal from  right leg about 6 years ago; left heart catheterization with coronary angiogram  (N/A, 05/11/2011); and Amputation (Right, 07/02/2017). Prior to Admission medications   Medication Sig Start Date End Date Taking? Authorizing Provider  aspirin 325 MG tablet Take 325 mg by mouth daily.   Yes [provider]  atorvastatin (LIPITOR) 40 MG tablet take 1 tablet by mouth once daily Patient not taking: Reported on 12/23/2016 12/30/15   Beaulah Dinning, MD  carvedilol (COREG) 6.25 MG tablet take 1 tablet by mouth twice a day with meals Patient not taking: Reported on 12/23/2016 05/01/14   Briscoe Deutscher, DO  enalapril-hydrochlorothiazide (VASERETIC) 10-25 MG tablet take 1 tablet by mouth once daily Patient not taking: Reported on 12/23/2016 01/01/16   Beaulah Dinning, MD  metFORMIN (GLUCOPHAGE) 1000 MG tablet Take 0.5 tablets (500 mg total) by mouth 2 (two) times daily with a meal. Patient not taking: Reported on 12/23/2016 09/02/12   Brent Bulla, MD       VITAL SIGNS: Temp:  [97.4 F (36.3 C)-98.9 F (37.2 C)] 97.4 F (36.3 C) (04/08 0829) Pulse Rate:  [59-75] 75 (04/08 1000) Resp:  [9-23] 23 (04/08 1000) BP: (103-143)/(62-94) 116/76 (04/08 1000) SpO2:  [94 %-100 %] 100 % (04/08 1000) FiO2 (%):  [30 %] 30 % (04/08 0800) Weight:  [200 lb 6.4 oz (90.9 kg)] 200 lb 6.4 oz (90.9 kg) (04/08 0435)  PHYSICAL EXAMINATION: General: the patient is intermittenmtly restless Cor:: RRR s1 S2 Chest bilateral BS , no major wheeze or rales.  Abdo: Soft, Large midline ventral hernia. EXTR: rIGHT bka WITH VACUUM DRESSING NEURO: He is awake. He is in fairly7 constant motion despite the use oof Precedex. Will not really follow commands   Recent Labs  Lab  07/02/17 0331 07/02/17 2041 07/03/17 0521  NA 130* 133* 135  K 3.7 4.6 4.2  CL 93* 97* 101  CO2 25 22 24   BUN 20 22* 23*  CREATININE 1.13 1.26* 1.09  GLUCOSE 182* 223* 151*   Recent Labs  Lab 07/02/17 0331 07/02/17 2041 07/03/17 0521  HGB 11.8* 11.8* 11.0*  HCT 35.9* 36.1* 34.1*  WBC 15.5* 17.1* 11.8*  PLT 258  242 233   No results found.  ASSESSMENT / PLAN:  CULTURES: 4/1 BC: viridans strep 4/4 BC :NGTD  ANTIBIOTICS: Vancomycin/zosyn>>Ceftriaxone   LINES/TUBES: ETT Foley   ASSESSMENT / PLAN: 72 year old man with history of DM, mixed heart failure and HTN.  Admitted with altered mental status on 4/1, found to be in septic shock due to strep viridans bacteremia presumably due to severe diabetic foot ulcer of the right lower extremity status post below knee amputation.  Also with encephalopathy presumed metabolic.  PULMONARY A: Intubated for airway protection We were hpoing to extubate the patient but he is having intermittent apneic spells. When we extubate him we may need toi keep him oN precededx given his agitation. It is my feeling we will be able to extubate this patient today. On CPAP with pS of 10 his TVs are consistently >600cc.   CARDIOVASCULAR A:  Septic shock, now resolved Systolic HF, EF 40%, unchanged from prior  - restart home beta blocker and antihypertensives once BP allows. Will hold for now given severe hypotension yesterday though improving - gentle diuresis to optimize chances of successful extubation  RENAL A:   Normal renal function. Creatinione cointinuees to improve.     INFECTIOUS A:   Strep viridans bacteremia  P:   - continue ceftriaxone as he has improved on this but will follow up on sensitivities once available - will need 2 week course of total antibiotics, can be transitioned to oral once all evidence of active infection improving and sensitivities are available  ENDOCRINE A:   Diabetes Mellitus   P:   lantus plus sliding scale insulin.TSH was a little elevated. Will check  Free t4  NEUROLOGIC He is grossly enecephalopathic. May still be residual of his sepsis.MRI and EEG were grossly unremarkable.        07/05/2017, 10:56 AM

## 2017-07-05 NOTE — Progress Notes (Signed)
Subjective: Patient failed a trial off the vent this AM. Precedex stopped 30 minutes ago and now responding to verbal commands.   Objective: Current vital signs: BP 125/78   Pulse 66   Temp (!) 97.4 F (36.3 C) (Axillary)   Resp 15   Ht 6' (1.829 m)   Wt 90.9 kg (200 lb 6.4 oz)   SpO2 100%   BMI 27.18 kg/m  Vital signs in last 24 hours: Temp:  [97.4 F (36.3 C)-98.9 F (37.2 C)] 97.4 F (36.3 C) (04/08 0829) Pulse Rate:  [59-82] 66 (04/08 0800) Resp:  [9-20] 15 (04/08 0800) BP: (102-144)/(62-94) 125/78 (04/08 0800) SpO2:  [87 %-100 %] 100 % (04/08 0800) FiO2 (%):  [30 %] 30 % (04/08 0800) Weight:  [90.9 kg (200 lb 6.4 oz)] 90.9 kg (200 lb 6.4 oz) (04/08 0435)  Intake/Output from previous day: 04/07 0701 - 04/08 0700 In: 2994.4 [I.V.:1500.3; WV/PX:1062.6; IV Piggyback:100] Out: 1450 [Urine:1450] Intake/Output this shift: Total I/O In: 133.6 [I.V.:63.6; NG/GT:70] Out: -  Nutritional status: Diet NPO time specified  General: Intubated on the ventilator HEENT: [place]/AT Ext: Right BKA with wound vac. Upper extremity restraints noted.   Neurologic Exam: Ment: Opens eyes to light sternal rub. Poor eye contact. Thrashes upper extremities intermittently. Lifted arms when asked to grip examiner's hand. Lifted both legs to command. Becomes agitated following stimulation.  CN: PERRL. Face symmetric. Motor: Does not sustain attention for detailed testing. Lifts all 4 extremities antigravity without gross asymmetry.  Cerebellar: Unable to assess.    Lab Results: Results for orders placed or performed during the hospital encounter of 06/28/17 (from the past 48 hour(s))  Glucose, capillary     Status: Abnormal   Collection Time: 07/03/17 11:38 AM  Result Value Ref Range   Glucose-Capillary 117 (H) 65 - 99 mg/dL  Glucose, capillary     Status: Abnormal   Collection Time: 07/03/17  3:48 PM  Result Value Ref Range   Glucose-Capillary 174 (H) 65 - 99 mg/dL  Phosphorus     Status:  None   Collection Time: 07/03/17  4:33 PM  Result Value Ref Range   Phosphorus 3.3 2.5 - 4.6 mg/dL    Comment: Performed at [facility], [place] [address] [place], [place] [address]  Glucose, capillary     Status: Abnormal   Collection Time: 07/03/17  8:43 PM  Result Value Ref Range   Glucose-Capillary 172 (H) 65 - 99 mg/dL  Glucose, capillary     Status: Abnormal   Collection Time: 07/03/17 11:32 PM  Result Value Ref Range   Glucose-Capillary 218 (H) 65 - 99 mg/dL  Glucose, capillary     Status: Abnormal   Collection Time: 07/04/17  3:56 AM  Result Value Ref Range   Glucose-Capillary 203 (H) 65 - 99 mg/dL  Glucose, capillary     Status: Abnormal   Collection Time: 07/04/17  8:06 AM  Result Value Ref Range   Glucose-Capillary 129 (H) 65 - 99 mg/dL  Glucose, capillary     Status: Abnormal   Collection Time: 07/04/17 12:55 PM  Result Value Ref Range   Glucose-Capillary 126 (H) 65 - 99 mg/dL  Glucose, capillary     Status: Abnormal   Collection Time: 07/04/17  4:24 PM  Result Value Ref Range   Glucose-Capillary 127 (H) 65 - 99 mg/dL  Glucose, capillary     Status: Abnormal   Collection Time: 07/04/17  8:09 PM  Result Value Ref Range   Glucose-Capillary 138 (  H) 65 - 99 mg/dL  Glucose, capillary     Status: Abnormal   Collection Time: 07/04/17 11:09 PM  Result Value Ref Range   Glucose-Capillary 149 (H) 65 - 99 mg/dL  Glucose, capillary     Status: Abnormal   Collection Time: 07/05/17  3:07 AM  Result Value Ref Range   Glucose-Capillary 207 (H) 65 - 99 mg/dL  T4, free     Status: None   Collection Time: 07/05/17  3:17 AM  Result Value Ref Range   Free T4 0.94 0.61 - 1.12 ng/dL    Comment: (NOTE) Biotin ingestion may interfere with free T4 tests. If the results are inconsistent with the TSH level, previous test results, or the clinical presentation, then consider biotin interference. If needed, order repeat testing after stopping biotin. Performed at [facility], [place] [address], [place], [place] [address]   Glucose, capillary     Status: Abnormal   Collection Time: 07/05/17  8:28 AM  Result Value Ref Range   Glucose-Capillary 252 (H) 65 - 99 mg/dL   Comment 1 Notify RN    Comment 2 Document in Chart     Recent Results (from the past 240 hour(s))  MRSA PCR Screening     Status: None   Collection Time: 06/28/17  2:59 AM  Result Value Ref Range Status   MRSA by PCR NEGATIVE NEGATIVE Final    Comment:        The GeneXpert MRSA Assay (FDA approved for NASAL specimens only), is one component of a comprehensive MRSA colonization surveillance program. It is not intended to diagnose MRSA infection nor to guide or monitor treatment for MRSA infections. Performed at [facility], [place] [address], [place], [place] [address]   Blood Culture (routine x 2)     Status: Abnormal   Collection Time: 06/28/17  6:33 PM  Result Value Ref Range Status   Specimen Description   Final    BLOOD Performed at [facility] [address], [place], [place] [address]    Special Requests   Final    BOTTLES DRAWN AEROBIC AND ANAEROBIC Blood Culture adequate volume   Culture  Setup Time   Final    GRAM POSITIVE RODS ANAEROBIC BOTTLE ONLY CRITICAL RESULT CALLED TO, READ BACK BY AND VERIFIED WITH: J LEDFORD PHARMD 07/01/17 0001 JDW    Culture (A)  Final    DIPHTHEROIDS(CORYNEBACTERIUM SPECIES) Standardized susceptibility testing for this organism is not available. Performed at [facility], [place] [address], [place], [place] [address]    Report Status 07/03/2017 FINAL  Final  Blood Culture (routine x 2)     Status: Abnormal   Collection Time: 06/28/17  6:33 PM  Result Value Ref Range Status   Specimen Description   Final    BLOOD BLOOD LEFT ARM Performed at [facility] [address] [place], [place] [address]    Special Requests   Final    BOTTLES DRAWN AEROBIC AND ANAEROBIC Blood Culture adequate  volume Performed at [facility] [address], [place], [place] [address]    Culture  Setup Time   Final    GRAM POSITIVE COCCI IN CHAINS AEROBIC BOTTLE ONLY CRITICAL RESULT CALLED TO, READ BACK BY AND VERIFIED WITH: N. BATCHELDER PHARMD, AT 4010 06/30/17 BY D. VANHOOK    Culture (A)  Final    VIRIDANS STREPTOCOCCUS PROPIONIBACTERIUM SPECIES Standardized susceptibility testing for this organism is not available.  Performed at [facility], [place] [address], [place], [place] [address]    Report Status 07/05/2017 FINAL  Final  Blood Culture ID Panel (Reflexed)     Status: Abnormal   Collection Time: 06/28/17  6:33 PM  Result Value Ref Range Status   Enterococcus species NOT DETECTED NOT DETECTED Final   Listeria monocytogenes NOT DETECTED NOT DETECTED Final   Staphylococcus species NOT DETECTED NOT DETECTED Final   Staphylococcus aureus NOT DETECTED NOT DETECTED Final   Streptococcus species DETECTED (A) NOT DETECTED Final    Comment: Not Enterococcus species, Streptococcus agalactiae, Streptococcus pyogenes, or Streptococcus pneumoniae. CRITICAL RESULT CALLED TO, READ BACK BY AND VERIFIED WITH: N. BATCHELDER PHARMD, AT 3664 06/30/17 BY D. VANHOOK    Streptococcus agalactiae NOT DETECTED NOT DETECTED Final   Streptococcus pneumoniae NOT DETECTED NOT DETECTED Final   Streptococcus pyogenes NOT DETECTED NOT DETECTED Final   Acinetobacter baumannii NOT DETECTED NOT DETECTED Final   Enterobacteriaceae species NOT DETECTED NOT DETECTED Final   Enterobacter cloacae complex NOT DETECTED NOT DETECTED Final   Escherichia coli NOT DETECTED NOT DETECTED Final   Klebsiella oxytoca NOT DETECTED NOT DETECTED Final   Klebsiella pneumoniae NOT DETECTED NOT DETECTED Final   Proteus species NOT DETECTED NOT DETECTED Final   Serratia marcescens NOT DETECTED NOT DETECTED Final   Haemophilus influenzae NOT DETECTED NOT DETECTED Final   Neisseria meningitidis NOT DETECTED NOT  DETECTED Final   Pseudomonas aeruginosa NOT DETECTED NOT DETECTED Final   Candida albicans NOT DETECTED NOT DETECTED Final   Candida glabrata NOT DETECTED NOT DETECTED Final   Candida krusei NOT DETECTED NOT DETECTED Final   Candida parapsilosis NOT DETECTED NOT DETECTED Final   Candida tropicalis NOT DETECTED NOT DETECTED Final  Blood Culture ID Panel (Reflexed)     Status: None   Collection Time: 06/28/17  6:33 PM  Result Value Ref Range Status   Enterococcus species NOT DETECTED NOT DETECTED Final   Vancomycin resistance NOT DETECTED NOT DETECTED Final   Listeria monocytogenes NOT DETECTED NOT DETECTED Final   Staphylococcus species NOT DETECTED NOT DETECTED Final   Staphylococcus aureus NOT DETECTED NOT DETECTED Final   Methicillin resistance NOT DETECTED NOT DETECTED Final   Streptococcus species NOT DETECTED NOT DETECTED Final   Streptococcus agalactiae NOT DETECTED NOT DETECTED Final    Comment: CRITICAL RESULT CALLED TO, READ BACK BY AND VERIFIED WITH: J LEDFORD PHARMD 07/01/17 0001 JDW    Streptococcus pneumoniae NOT DETECTED NOT DETECTED Final   Streptococcus pyogenes NOT DETECTED NOT DETECTED Final   Acinetobacter baumannii NOT DETECTED NOT DETECTED Final   Enterobacteriaceae species NOT DETECTED NOT DETECTED Final   Enterobacter cloacae complex NOT DETECTED NOT DETECTED Final   Escherichia coli NOT DETECTED NOT DETECTED Final   Klebsiella oxytoca NOT DETECTED NOT DETECTED Final   Klebsiella pneumoniae NOT DETECTED NOT DETECTED Final   Proteus species NOT DETECTED NOT DETECTED Final   Serratia marcescens NOT DETECTED NOT DETECTED Final   Carbapenem resistance NOT DETECTED NOT DETECTED Final   Haemophilus influenzae NOT DETECTED NOT DETECTED Final   Neisseria meningitidis NOT DETECTED NOT DETECTED Final   Pseudomonas aeruginosa NOT DETECTED NOT DETECTED Final   Candida albicans NOT DETECTED NOT DETECTED Final   Candida glabrata NOT DETECTED NOT DETECTED Final   Candida  krusei NOT DETECTED NOT DETECTED Final   Candida parapsilosis NOT DETECTED NOT DETECTED Final   Candida tropicalis NOT DETECTED NOT DETECTED Final    Comment: Performed at [facility], [place] [address], [place], [place] [address]  Surgical pcr screen     Status: None   Collection Time: 07/01/17  8:23 PM  Result Value Ref Range Status   MRSA, PCR NEGATIVE NEGATIVE Final   Staphylococcus aureus NEGATIVE NEGATIVE Final    Comment: (NOTE) The Xpert SA Assay (FDA approved for NASAL specimens in patients 70 years of age and older), is one component of a comprehensive surveillance program. It is not intended to diagnose infection nor to guide or monitor treatment. Performed at [facility], [place] [address] [place], [place] [address]   Culture, blood (routine x 2)     Status: None (Preliminary result)   Collection Time: 07/02/17  2:20 PM  Result Value Ref Range Status   Specimen Description BLOOD RIGHT ANTECUBITAL  Final   Special Requests AEROBIC BOTTLE ONLY Blood Culture adequate volume  Final   Culture   Final    NO GROWTH 2 DAYS Performed at [facility], [place] [address] [place], [place] [address]    Report Status PENDING  Incomplete  Culture, blood (routine x 2)     Status: None (Preliminary result)   Collection Time: 07/02/17  2:25 PM  Result Value Ref Range Status   Specimen Description BLOOD BLOOD RIGHT HAND  Final   Special Requests AEROBIC BOTTLE ONLY Blood Culture adequate volume  Final   Culture   Final    NO GROWTH 2 DAYS Performed at [facility], [place] [address], [place], [place] [address]    Report Status PENDING  Incomplete    Lipid Panel No results for input(s): CHOL, TRIG, HDL, CHOLHDL, VLDL, LDLCALC in the last 72 hours.  Studies/Results: No results found.  Medications:  Scheduled: . atorvastatin  40 mg Oral Daily  . chlorhexidine  15 mL Mouth Rinse BID  . chlorhexidine gluconate (MEDLINE KIT)  15 mL Mouth Rinse BID  . docusate  100  mg Per Tube BID  . enoxaparin (LOVENOX) injection  40 mg Subcutaneous Q24H  . insulin aspart  2-6 Units Subcutaneous Q4H  . insulin glargine  10 Units Subcutaneous QHS  . mouth rinse  15 mL Mouth Rinse 10 times per day  . mouth rinse  15 mL Mouth Rinse QID  . multivitamin  15 mL Per Tube Daily  . nicotine  14 mg Transdermal Daily  . tamsulosin  0.4 mg Oral Daily   Continuous: . sodium chloride    . sodium chloride 50 mL/hr at 07/05/17 0800  . cefTRIAXone (ROCEPHIN)  IV Stopped (07/04/17 6226)  . dexmedetomidine (PRECEDEX) IV infusion Stopped (07/05/17 0910)  . feeding supplement (VITAL AF 1.2 CAL) 70 mL/hr at 07/05/17 0800  . methocarbamol (ROBAXIN)  IV      Assessment: 72 year old male with acute encephalopathy, now improving.  1. 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 Improvement on Neurological exam today, relative to yesterday's documented exam.  2. MRI brain showed no acute stroke.  3. EEG on 4/6 showed no electrographic seizure or seizure predisposition. Mild generalized non-specific cerebral dysfunction was noted.  Recommendations: 1. Continue to monitor clinically.  2. Monitor and correct electrolytes, as indicated.     LOS: 7 days   _0  signed: Dr. Kerney Elbe 07/05/2017  9:14 AM

## 2017-07-05 NOTE — Procedures (Signed)
Extubation Procedure Note  Patient Details:   Name: Miguel Harvey DOB: 08-01-1945 MRN: [id number]   Airway Documentation:     Evaluation  O2 sats: stable throughout Complications: No apparent complications Patient did tolerate procedure well. Bilateral Breath Sounds: Clear, Diminished   Yes   Patient extubated to a 4L [place]. Cuff leak was heard. No stridor was noted. Patient tolerating well at this time. RN at bedside with RT during extubation.  Darolyn Rua 07/05/2017, 2:19 PM

## 2017-07-05 NOTE — Progress Notes (Addendum)
Patient ID: Miguel Harvey, male   DOB: 09/14/1945, 72 y.o.   MRN: [id number] Patient intubated, wound VAC functioning well.  Will continue the wound VAC for 1 week without changing.  Postoperative day 2 transtibial amputation

## 2017-07-05 NOTE — Progress Notes (Signed)
PT Cancellation Note  Patient Details Name: Miguel Harvey MRN: [id number] DOB: 31-Dec-1945   Cancelled Treatment:    Reason Eval/Treat Not Completed: Medical issues which prohibited therapy(Pt extubated at 1415 and needed more time prior to eval. )Will see in the am.  Thanks.    Amadeo Garnet Anhar Mcdermott 07/05/2017, 2:52 PM  [facility] [phone] (pager)

## 2017-07-06 LAB — BASIC METABOLIC PANEL
Anion gap: 11 (ref 5–15)
BUN: 14 mg/dL (ref 6–20)
CALCIUM: 8.1 mg/dL — AB (ref 8.9–10.3)
CHLORIDE: 104 mmol/L (ref 101–111)
CO2: 24 mmol/L (ref 22–32)
CREATININE: 1.07 mg/dL (ref 0.61–1.24)
GFR calc Af Amer: >60 mL/min (ref >60)
GFR calc non Af Amer: >60 mL/min (ref >60)
Glucose, Bld: 150 mg/dL — ABNORMAL HIGH (ref 65–99)
Potassium: 3.6 mmol/L (ref 3.5–5.1)
Sodium: 139 mmol/L (ref 135–145)

## 2017-07-06 LAB — GLUCOSE, CAPILLARY
GLUCOSE-CAPILLARY: 146 mg/dL — AB (ref 65–99)
GLUCOSE-CAPILLARY: 97 mg/dL (ref 65–99)
Glucose-Capillary: 156 mg/dL — ABNORMAL HIGH (ref 65–99)
Glucose-Capillary: 111 mg/dL — ABNORMAL HIGH (ref 65–99)

## 2017-07-06 LAB — MAGNESIUM: MAGNESIUM: 2.1 mg/dL (ref 1.7–2.4)

## 2017-07-06 MED ORDER — POTASSIUM CHLORIDE 10 MEQ/100ML IV SOLN
10.0000 meq | INTRAVENOUS | Status: AC
  Administered 2017-07-06 (×3): 10 meq via INTRAVENOUS
  Filled 2017-07-06 (×3): qty 100

## 2017-07-06 MED ORDER — JUVEN PO PACK
1.0000 | PACK | Freq: Two times a day (BID) | ORAL | Status: DC
  Administered 2017-07-06 – 2017-07-11 (×9): 1 via ORAL
  Filled 2017-07-06 (×11): qty 1

## 2017-07-06 MED ORDER — METOPROLOL TARTRATE 25 MG PO TABS
25.0000 mg | ORAL_TABLET | Freq: Two times a day (BID) | ORAL | Status: DC
  Administered 2017-07-06 (×2): 25 mg via ORAL
  Filled 2017-07-06 (×2): qty 1

## 2017-07-06 MED ORDER — INSULIN GLARGINE 100 UNIT/ML ~~LOC~~ SOLN
10.0000 [IU] | Freq: Every day | SUBCUTANEOUS | Status: DC
  Administered 2017-07-07 – 2017-07-11 (×5): 10 [IU] via SUBCUTANEOUS
  Filled 2017-07-06 (×5): qty 0.1

## 2017-07-06 MED ORDER — ADULT MULTIVITAMIN W/MINERALS CH
1.0000 | ORAL_TABLET | Freq: Every day | ORAL | Status: DC
  Administered 2017-07-06 – 2017-07-11 (×6): 1 via ORAL
  Filled 2017-07-06 (×6): qty 1

## 2017-07-06 MED ORDER — AMIODARONE HCL IN DEXTROSE 360-4.14 MG/200ML-% IV SOLN
60.0000 mg/h | INTRAVENOUS | Status: AC
  Administered 2017-07-06: 60 mg/h via INTRAVENOUS
  Filled 2017-07-06: qty 200

## 2017-07-06 MED ORDER — DOCUSATE SODIUM 100 MG PO CAPS
100.0000 mg | ORAL_CAPSULE | Freq: Two times a day (BID) | ORAL | Status: DC
  Administered 2017-07-06 – 2017-07-11 (×5): 100 mg via ORAL
  Filled 2017-07-06 (×9): qty 1

## 2017-07-06 NOTE — Care Management Important Message (Signed)
Important Message  Patient Details  Name: Miguel Harvey MRN: [id number] Date of Birth: February 04, 1946   Medicare Important Message Given:  Yes    Elliot Cousin, RN 07/06/2017, 11:44 AM

## 2017-07-06 NOTE — [place] FL2 (Signed)
[place] MEDICA[place] FL2 LEVEL OF CARE SCREENING TOOL     IDENTIFICATION  Patient Name: Miguel Harvey Birthdate: 06-11-1945 Sex: male Admission Date (Current Location): 06/28/2017  [facility] and [place] Number:  Producer, television/film/video and Address:  [facility] [place], [address] [place], [place] [address]      Provider Number: [id number]  Attending Physician Name and Address:  Lynnell Catalan, MD  Relative Name and Phone Number:  Bradly Bienenstock, Daughter, [phone]     Current Level of Care: Hospital Recommended Level of Care: Skilled Nursing Facility Prior Approval Number:    Date Approved/Denied:   PASRR Number: [id number]  Discharge Plan: SNF    Current Diagnoses: Patient Active Problem List   Diagnosis Date Noted  . Pressure injury of skin 06/29/2017  . Sepsis (HCC) 06/28/2017  . Diabetic foot infection (HCC) 06/28/2017  . Elevated troponin 06/28/2017  . Pulmonary nodule 07/01/2011  . 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 Family history of early CAD, brother died 38 CAD 2011/05/22  . Dyslipidemia 22-May-2011  . NICM, minor CAD at cath, EF 25% at cath, 55% by 2D 05-22-2011  . Diabetes mellitus (HCC) 05/07/2011  . HTN (hypertension), malignant 05/07/2011  . TOBACCO DEPENDENCE 05/27/2006    Orientation RESPIRATION BLADDER Height & Weight     Self, Place  O2(Nasal cannula 4L) Incontinent, Indwelling catheter Weight: 96.9 kg (213 lb 10 oz) Height:  6' (182.9 cm)  BEHAVIORAL SYMPTOMS/MOOD NEUROLOGICAL BOWEL NUTRITION STATUS      Continent Diet(please see DC summary)  AMBULATORY STATUS COMMUNICATION OF NEEDS Skin   Extensive Assist Verbally Wound Vac, Surgical wounds(unstageable on foot; closed incision on leg; negative pressure wound with wound vac running at on leg)                       Personal Care Assistance Level of Assistance  Bathing, Feeding, Dressing Bathing  Assistance: Limited assistance(mod assist) Feeding assistance: Independent Dressing Assistance: Limited assistance(mod assist)     Functional Limitations Info  Sight, Hearing, Speech Sight Info: Adequate Hearing Info: Adequate Speech Info: Adequate    SPECIAL CARE FACTORS FREQUENCY  OT (By licensed OT)     PT Frequency: 5x/week OT Frequency: 3x/week            Contractures Contractures Info: Not present    Additional Factors Info  Code Status, Allergies, Insulin Sliding Scale Code Status Info: Full Allergies Info: No Known Allergies   Insulin Sliding Scale Info: novolog 3x/day with meals and at bedtime, lantus at bedtime       Current Medications (07/06/2017):  This is the current hospital active medication list Current Facility-Administered Medications  Medication Dose Route Frequency Provider Last Rate Last Dose  . 0.9 %  sodium chloride infusion   Intravenous Continuous Nadara Mustard, MD      . 0.9 %  sodium chloride infusion   Intravenous Continuous Lynnell Catalan, MD 50 mL/hr at 07/06/17 0900    . acetaminophen (TYLENOL) tablet 650 mg  650 mg Oral Q6H PRN Nadara Mustard, MD       Or  . acetaminophen (TYLENOL) suppository 650 mg  650 mg Rectal Q6H PRN Nadara Mustard, MD      . atorvastatin (LIPITOR) tablet 40 mg  40 mg Oral Daily Nadara Mustard, MD   40 mg at 07/06/17 1018  . bisacodyl (DULCOLAX) suppository 10 mg  10 mg  Rectal Daily PRN Nadara Mustard, MD      . cefTRIAXone (ROCEPHIN) 2 g in sodium chloride 0.9 % 100 mL IVPB  2 g Intravenous Q24H Scarlett Presto, [facility]   Stopped at 07/05/17 2009  . dexmedetomidine (PRECEDEX) 200 MCG/50ML (4 mcg/mL) infusion  0.4-1.2 mcg/kg/hr Intravenous Titrated Otho Najjar, MD   Stopped at 07/06/17 [phone]  . docusate sodium (COLACE) capsule 100 mg  100 mg Oral BID Lynnell Catalan, MD   100 mg at 07/06/17 1018  . enoxaparin (LOVENOX) injection 40 mg  40 mg Subcutaneous Q24H Agarwala, Ravi, MD   40 mg at 07/06/17 0800  . feeding  supplement (VITAL AF 1.2 CAL) liquid 1,000 mL  1,000 mL Per Tube Continuous Lynnell Catalan, MD 70 mL/hr at 07/06/17 0900    . fentaNYL (SUBLIMAZE) injection 50 mcg  50 mcg Intravenous Q1H PRN Karl Ito, MD   50 mcg at 07/05/17 1237  . insulin aspart (novoLOG) injection 0-20 Units  0-20 Units Subcutaneous TID WC Otho Najjar, MD   3 Units at 07/06/17 [phone]  . insulin aspart (novoLOG) injection 0-5 Units  0-5 Units Subcutaneous QHS Otho Najjar, MD      . Melene Muller ON 07/07/2017] insulin glargine (LANTUS) injection 10 Units  10 Units Subcutaneous Daily Otho Najjar, MD      . magnesium citrate solution 1 Bottle  1 Bottle Oral Once PRN Nadara Mustard, MD      . methocarbamol (ROBAXIN) tablet 500 mg  500 mg Oral Q6H PRN Nadara Mustard, MD       Or  . methocarbamol (ROBAXIN) 500 mg in dextrose 5 % 50 mL IVPB  500 mg Intravenous Q6H PRN Nadara Mustard, MD      . metoCLOPramide (REGLAN) tablet 5-10 mg  5-10 mg Oral Q8H PRN Nadara Mustard, MD       Or  . metoCLOPramide (REGLAN) injection 5-10 mg  5-10 mg Intravenous Q8H PRN Nadara Mustard, MD      . multivitamin with minerals tablet 1 tablet  1 tablet Oral Daily Agarwala, Ravi, MD   1 tablet at 07/06/17 1017  . nicotine (NICODERM CQ - dosed in mg/24 hours) patch 14 mg  14 mg Transdermal Daily Nadara Mustard, MD   14 mg at 07/06/17 1024  . ondansetron (ZOFRAN) tablet 4 mg  4 mg Oral Q6H PRN Nadara Mustard, MD       Or  . ondansetron [facility]) injection 4 mg  4 mg Intravenous Q6H PRN Nadara Mustard, MD      . polyethylene glycol (MIRALAX / GLYCOLAX) packet 17 g  17 g Oral Daily PRN Nadara Mustard, MD      . tamsulosin [facility]) capsule 0.4 mg  0.4 mg Oral Daily Nadara Mustard, MD   0.4 mg at 07/06/17 1017     Discharge Medications: Please see discharge summary for a list of discharge medications.  Relevant Imaging Results:  Relevant Lab Results:   Additional Information SSN: [id number]  Mearl Latin, LCSWA

## 2017-07-06 NOTE — Care Management Note (Signed)
Case Management Note  Patient Details  Name: XENG SCERBO MRN: [id number] Date of Birth: 10/18/1945  Subjective/Objective:    S/p transtibial amputation, DM, HTN, HF            Action/Plan: NCM spoke to pt and agreeable to SNF placement. States his dtr, Duwayne Heck assist with care. Lives at home alone. CSW following for SNF placement.   Expected Discharge Date:               Expected Discharge Plan:  Skilled Nursing Facility  In-House Referral:  Clinical Social Work  Discharge planning Services  CM Consult  Post Acute Care Choice:  NA Choice offered to:  NA  DME Arranged:  N/A DME Agency:  NA  HH Arranged:  NA HH Agency:  NA  Status of Service:  Completed, signed off  If discussed at Long Length of Stay Meetings, dates discussed:    Additional Comments:  Elliot Cousin, RN 07/06/2017, 11:44 AM

## 2017-07-06 NOTE — Progress Notes (Signed)
Name: Miguel Harvey MRN: [id number] DOB: 09-Dec-1945    ADMISSION DATE:  06/28/2017 CONSULTATION DATE:  07/02/17  CHIEF COMPLAINT:  confusion  BRIEF PATIENT DESCRIPTION:  72 year old male with severe diabetic foot infection, septic shock and encephalopathy    SIGNIFICANT EVENTS  07/02/17: underwent  transtibial amputation for gangrenous ulcers with abscess right forefoot.  Hypotension in OR requiring epinephrine following induction of anesthesia. Remained intubated and minimally responsive. Admitted to MICU  STUDIES:  MRI Brain 07/02/17: no acute changes   HISTORY OF PRESENT ILLNESS:  72 year old man with history of DM, mixed heart failure and HTN.  Admitted with altered mental status on 4/1. Found crawling on the floor of his home. Generalized weakness for past 2 days. History of recurrent falls. Poor follow-up for DM, reported symptoms of peripheral neuropathy.    Increasing obtundation while in hospital and found to have severe diabetic foot infection with gangrene as cause of sepsis with hypotension, tachycardia, leukocytosis and elevated lactate.   Underwent transtibial amputation for gangrenous ulcers with abscess right forefoot.  Hypotension in OR requiring epinephrine following induction of anesthesia. Remained intubated and minimally responsive.  4/7 The patient is awake and eyes are open. He is intemrittently agitated. At time she is having apneic spells on CPAP trial. His tidal volunes appear to be pretty gpood and consistently >500cc.  4/8 The patient is awake. It appears that he has a diffcult time focusing and following commands. From a purely respiratory viewpt he appears ready for extubation.  4/9 Patient extubated. He looks well. He is not delirious at this point and is off precedex. Will start low Na , carb controlled diet. He has been hemodynamically stable. He is cogent and cooperative.     PAST MEDICAL HISTORY :   has a past medical history of CHF  (congestive heart failure) (HCC), Diabetes mellitus, and Hypertension.  has a past surgical history that includes cyst removal from  right leg about 6 years ago; left heart catheterization with coronary angiogram (N/A, 05/11/2011); and Amputation (Right, 07/02/2017). Prior to Admission medications   Medication Sig Start Date End Date Taking? Authorizing Provider  aspirin 325 MG tablet Take 325 mg by mouth daily.   Yes [provider]  atorvastatin (LIPITOR) 40 MG tablet take 1 tablet by mouth once daily Patient not taking: Reported on 12/23/2016 12/30/15   Beaulah Dinning, MD  carvedilol (COREG) 6.25 MG tablet take 1 tablet by mouth twice a day with meals Patient not taking: Reported on 12/23/2016 05/01/14   Briscoe Deutscher, DO  enalapril-hydrochlorothiazide (VASERETIC) 10-25 MG tablet take 1 tablet by mouth once daily Patient not taking: Reported on 12/23/2016 01/01/16   Beaulah Dinning, MD  metFORMIN (GLUCOPHAGE) 1000 MG tablet Take 0.5 tablets (500 mg total) by mouth 2 (two) times daily with a meal. Patient not taking: Reported on 12/23/2016 09/02/12   Brent Bulla, MD       VITAL SIGNS: Temp:  [97.7 F (36.5 C)-100.3 F (37.9 C)] 98.4 F (36.9 C) (04/09 0800) Pulse Rate:  [59-88] 88 (04/09 0900) Resp:  [11-25] 11 (04/09 0900) BP: (99-139)/(54-94) 137/94 (04/09 0900) SpO2:  [89 %-100 %] 100 % (04/09 0900) FiO2 (%):  [30 %] 30 % (04/08 1133) Weight:  [213 lb 10 oz (96.9 kg)] 213 lb 10 oz (96.9 kg) (04/09 0439)  PHYSICAL EXAMINATION: General: the patient is intermittenmtly restless Cor:: RRR s1 S2 Chest bilateral BS , no major wheeze or rales.  Abdo:  Soft, Large midline ventral hernia. EXTR: rIGHT bka WITH VACUUM DRESSING NEURO: He is awake. He is in fairly7 constant motion despite the use oof Precedex. Will not really follow commands   Recent Labs  Lab 07/02/17 0331 07/02/17 2041 07/03/17 0521  NA 130* 133* 135  K 3.7 4.6 4.2  CL 93* 97* 101  CO2 25 22 24   BUN 20  22* 23*  CREATININE 1.13 1.26* 1.09  GLUCOSE 182* 223* 151*   Recent Labs  Lab 07/02/17 0331 07/02/17 2041 07/03/17 0521  HGB 11.8* 11.8* 11.0*  HCT 35.9* 36.1* 34.1*  WBC 15.5* 17.1* 11.8*  PLT 258 242 233   No results found.  ASSESSMENT / PLAN:  CULTURES: 4/1 BC: viridans strep 4/4 BC :NGTD  ANTIBIOTICS: Vancomycin/zosyn>>Ceftriaxone   LINES/TUBES: ETT Foley   ASSESSMENT / PLAN: 72 year old man with history of DM, mixed heart failure and HTN.  Admitted with altered mental status on 4/1, found to be in septic shock due to strep viridans bacteremia presumably due to severe diabetic foot ulcer of the right lower extremity status post below knee amputation.  Also with encephalopathy presumed metabolic.  PULMONARY  patient extubated. He is on a nasal cannula.    CARDIOVASCULAR   Septic shock, now resolved Systolic HF, EF 40%, unchanged from prior  - restart home beta blocker and antihypertensives once BP allows. Will hold for now given severe hypotension yesterday though improving - gentle diuresis to optimize chances of successful extubation  RENAL A:   Normal renal function. Creatinione cointinuees to improve.     INFECTIOUS A:   Strep viridans bacteremia  P:   - continue ceftriaxone as he has improved on this but will follow up on sensitivities once available - will need 2 week course of total antibiotics, can be transitioned to oral once all evidence of active infection improving and sensitivities are available  ENDOCRINE A:   Diabetes Mellitus   P:   lantus plus sliding scale insulin.TSH was a little elevated. Will check  Free t4  NEUROLOGIC  as noted his mental status has improved. He is moving all extemites his speech is fluent and he is cogent.   R BKA As per ortho.  I believe the patient can be downgraded to the regualr mmedical floor.     07/06/2017, 10:12 AM

## 2017-07-06 NOTE — Progress Notes (Signed)
Nutrition Follow-up  DOCUMENTATION CODES:   Not applicable  INTERVENTION:   -1 packet Juven BID, each packet provides 80 calories, 8 grams of carbohydrate, and 14 grams of amino acids; supplement contains CaHMB, glutamine, and arginine, to promote wound healing   NUTRITION DIAGNOSIS:   Increased nutrient needs related to wound healing as evidenced by estimated needs. Ongoing.   GOAL:   Patient will meet greater than or equal to 90% of their needs Progressing.   MONITOR:   PO intake, Supplement acceptance  ASSESSMENT:   71 yo male with PMH of HTN, DM, CHF, and smoker who was admitted on 4/1 with generalized weakness and inability to walk. 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 Pt remains intubated s/p procedure and TF consult placed.  4/8 extubated 4/9 diet advanced this am, no meals eaten yet  Medications reviewed and include: colace, SSI, lantus, MVI Labs reviewed CBG (last 3)  Recent Labs    07/05/17 2154 07/06/17 0805 07/06/17 1209  GLUCAP 190* 146* 156*     Diet Order:  Diet heart healthy/carb modified Room service appropriate? Yes; Fluid consistency: Thin  EDUCATION NEEDS:   Not appropriate for education at this time  Skin:  Skin Assessment: Skin Integrity Issues: Skin Integrity Issues:: Incisions Incisions: R BKA with wound vac 4/5 Other: R toe wound  Last BM:  4/9  Height:   Ht Readings from Last 1 Encounters:  07/05/17 6' (1.829 m)    Weight:   Wt Readings from Last 1 Encounters:  07/06/17 213 lb 10 oz (96.9 kg)    Ideal Body Weight:  72.7 kg  BMI:  Body mass index is 28.97 kg/m.  Estimated Nutritional Needs:   Kcal:  2000-2200  Protein:  120-138 grams (1.3-1.5 grams/kg)  Fluid:  2 L  Kendell Bane RD, LDN, CNSC [phone] Pager [phone] After Hours Pager

## 2017-07-07 LAB — GLUCOSE, CAPILLARY
GLUCOSE-CAPILLARY: 98 mg/dL (ref 65–99)
Glucose-Capillary: 125 mg/dL — ABNORMAL HIGH (ref 65–99)
Glucose-Capillary: 142 mg/dL — ABNORMAL HIGH (ref 65–99)
Glucose-Capillary: 174 mg/dL — ABNORMAL HIGH (ref 65–99)

## 2017-07-07 LAB — CULTURE, BLOOD (ROUTINE X 2)
CULTURE: NO GROWTH
Culture: NO GROWTH
SPECIAL REQUESTS: ADEQUATE
Special Requests: ADEQUATE

## 2017-07-07 MED ORDER — AMIODARONE HCL IN DEXTROSE 360-4.14 MG/200ML-% IV SOLN
60.0000 mg/h | INTRAVENOUS | Status: AC
  Administered 2017-07-07: 30 mg/h via INTRAVENOUS
  Filled 2017-07-07: qty 200

## 2017-07-07 MED ORDER — CARVEDILOL 6.25 MG PO TABS
6.2500 mg | ORAL_TABLET | Freq: Two times a day (BID) | ORAL | Status: DC
  Administered 2017-07-07 – 2017-07-11 (×9): 6.25 mg via ORAL
  Filled 2017-07-07 (×3): qty 1
  Filled 2017-07-07: qty 2
  Filled 2017-07-07: qty 1
  Filled 2017-07-07: qty 2
  Filled 2017-07-07 (×3): qty 1

## 2017-07-07 MED ORDER — AMIODARONE HCL IN DEXTROSE 360-4.14 MG/200ML-% IV SOLN
60.0000 mg/h | INTRAVENOUS | Status: DC
Start: 2017-07-07 — End: 2017-07-07

## 2017-07-07 MED ORDER — AMIODARONE HCL IN DEXTROSE 360-4.14 MG/200ML-% IV SOLN: 30.0000 mg/h | INTRAVENOUS | Status: DC

## 2017-07-07 MED ORDER — GUAIFENESIN-DM 100-10 MG/5ML PO SYRP
5.0000 mL | ORAL_SOLUTION | ORAL | Status: DC | PRN
  Administered 2017-07-07: 5 mL via ORAL
  Filled 2017-07-07 (×3): qty 5

## 2017-07-07 MED ORDER — METFORMIN HCL 500 MG PO TABS
500.0000 mg | ORAL_TABLET | Freq: Two times a day (BID) | ORAL | Status: DC
  Administered 2017-07-07 – 2017-07-11 (×9): 500 mg via ORAL
  Filled 2017-07-07 (×9): qty 1

## 2017-07-07 MED ORDER — LISINOPRIL 10 MG PO TABS
10.0000 mg | ORAL_TABLET | Freq: Every day | ORAL | Status: DC
  Administered 2017-07-07 – 2017-07-11 (×5): 10 mg via ORAL
  Filled 2017-07-07 (×2): qty 1
  Filled 2017-07-07: qty 2
  Filled 2017-07-07 (×2): qty 1

## 2017-07-07 NOTE — Plan of Care (Signed)
Pt currently on room air with O2 sats in the 90's.  Will continue to monitor  Heloise Purpura RN

## 2017-07-07 NOTE — Evaluation (Addendum)
Physical Therapy Evaluation Patient Details Name: Miguel Harvey MRN: [id number] DOB: 1946/02/14 Today's Date: 07/07/2017   History of Present Illness  Miguel Harvey is a 72 y.o. male with medical history significant for combined systolic and diastolic CHF, diabetes, hypertension dyslipidemia, presented with an inability to walk, generalized weakness for past 2 days and a R forefoot ulceration with necrosis and foul-smelling dranage.  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  Extubaed 07/06/17.  Clinical Impression  Patient presents with decreased independence with mobility s/p above procedure.  Demonstrates limited activity tolerance, decreased balance, decreased awareness, decreased problem solving and increased time for all mobility.  Feel he will benefit from continued skilled PT in the acute setting to allow d/c to SNF level rehab.    Follow Up Recommendations SNF;Supervision/Assistance - 24 hour    Equipment Recommendations  Rolling walker with 5" wheels    Recommendations for Other Services       Precautions / Restrictions Precautions Precautions: Fall Restrictions Weight Bearing Restrictions: Yes RLE Weight Bearing: Non weight bearing      Mobility  Bed Mobility Overal bed mobility: Needs Assistance Bed Mobility: Supine to Sit     Supine to sit: HOB elevated;Max assist;+2 for physical assistance     General bed mobility comments: Patient participated and followed commands with increased time, assist for legs to EOB and to lift trunk despite using rail and increased time  Transfers Overall transfer level: Needs assistance   Transfers: Squat Pivot Transfers     Squat pivot transfers: Max assist;+2 physical assistance     General transfer comment: blocked L knee and used drop arm recliner with +2 A for squat pivot, pt sat on near edge of bed/chair and cues to scoot, but difficulty sequencing and needed  multimodal cues and assist for safety  Ambulation/Gait                Stairs            Wheelchair Mobility    Modified Rankin (Stroke Patients Only)       Balance Overall balance assessment: Needs assistance Sitting-balance support: Bilateral upper extremity supported;Feet supported Sitting balance-Leahy Scale: Poor Sitting balance - Comments: UE support for sitting, fatigued quickly, requiring back support for safety at EOB as OT obtaining drop arm recliner, sat EOB about 8-10 minutes       Standing balance comment: NT                             Pertinent Vitals/Pain Pain Assessment: 0-10 Pain Score: 4  Pain Location: R leg Pain Descriptors / Indicators: Sore Pain Intervention(s): Monitored during session    Home Living Family/patient expects to be discharged to:: Private residence Living Arrangements: Alone Available Help at Discharge: Available PRN/intermittently;Family Type of Home: Mobile home Home Access: Stairs to enter Entrance Stairs-Rails: Doctor, general practice of Steps: 4 Home Layout: One level Home Equipment: None      Prior Function Level of Independence: Independent               Hand Dominance   Dominant Hand: Right    Extremity/Trunk Assessment        Lower Extremity Assessment Lower Extremity Assessment: RLE deficits/detail;LLE deficits/detail RLE Deficits / Details: BKA with wound vac, bends knee about 30-40 degrees, extends to full extension, lifts from hip with assistance LLE Deficits / Details: AROM WFL, strength hip flexion 2+/5, knee  extension 4-/5 LLE Sensation: decreased light touch    Cervical / Trunk Assessment Cervical / Trunk Assessment: Kyphotic  Communication   Communication: No difficulties  Cognition Arousal/Alertness: Awake/alert Behavior During Therapy: WFL for tasks assessed/performed Overall Cognitive Status: Impaired/Different from baseline Area of Impairment:  Orientation;Memory                 Orientation Level: Time;Situation   Memory: Decreased short-term memory                General Comments General comments (skin integrity, edema, etc.): SpO2 100% throughout, but pt requesting O2 with RR up to 23    Exercises     Assessment/Plan    PT Assessment Patient needs continued PT services  PT Problem List Decreased strength;Decreased mobility;Decreased safety awareness;Decreased activity tolerance;Decreased cognition;Decreased balance;Decreased knowledge of use of DME;Pain;Decreased range of motion;Decreased knowledge of precautions       PT Treatment Interventions DME instruction;Therapeutic activities;Therapeutic exercise;Patient/family education;Balance training;Wheelchair mobility training;Functional mobility training    PT Goals (Current goals can be found in the Care Plan section)  Acute Rehab PT Goals Patient Stated Goal: Understands need for rehab PT Goal Formulation: With patient Time For Goal Achievement: 07/21/17 Potential to Achieve Goals: Fair Additional Goals Additional Goal #1: Patient to propel w/c 100' with S.    Frequency Min 3X/week   Barriers to discharge Decreased caregiver support      Co-evaluation PT/OT/SLP Co-Evaluation/Treatment: Yes Reason for Co-Treatment: Complexity of the patient's impairments (multi-system involvement);For patient/therapist safety PT goals addressed during session: Mobility/safety with mobility;Balance;Strengthening/ROM         AM-PAC PT "6 Clicks" Daily Activity  Outcome Measure Difficulty turning over in bed (including adjusting bedclothes, sheets and blankets)?: Unable Difficulty moving from lying on back to sitting on the side of the bed? : Unable Difficulty sitting down on and standing up from a chair with arms (e.g., wheelchair, bedside commode, etc,.)?: Unable Help needed moving to and from a bed to chair (including a wheelchair)?: Total Help needed walking  in hospital room?: Total Help needed climbing 3-5 steps with a railing? : Total 6 Click Score: 6    End of Session Equipment Utilized During Treatment: Gait belt Activity Tolerance: Patient limited by fatigue Patient left: with call bell/phone within reach;in chair Nurse Communication: Mobility status;Need for lift equipment PT Visit Diagnosis: Difficulty in walking, not elsewhere classified (R26.2);Muscle weakness (generalized) (M62.81);Unsteadiness on feet (R26.81)    Time: 6568-1275 PT Time Calculation (min) (ACUTE ONLY): 27 min   Charges:   PT Evaluation $PT Re-evaluation: 1 Re-eval     PT G CodesSheran Lawless, [place] [phone] 07/07/2017   Elray Mcgregor 07/07/2017, 1:24 PM

## 2017-07-07 NOTE — Progress Notes (Signed)
Name: Miguel Harvey MRN: [id number] DOB: 30-Jun-1945    ADMISSION DATE:  06/28/2017 CONSULTATION DATE:  07/02/17  CHIEF COMPLAINT:  confusion  BRIEF PATIENT DESCRIPTION:  72 year old male with severe diabetic foot infection, septic shock and encephalopathy.  SIGNIFICANT EVENTS  07/02/17: underwent  transtibial amputation for gangrenous ulcers with abscess right forefoot.  Hypotension in OR requiring epinephrine following induction of anesthesia. Remained intubated and minimally responsive. Admitted to MICU  STUDIES:  MRI Brain 07/02/17: no acute changes   HISTORY OF PRESENT ILLNESS:  72 year old man with history of DM, mixed heart failure and HTN.  Admitted with altered mental status on 4/1. Found crawling on the floor of his home. Generalized weakness for past 2 days. History of recurrent falls. Poor follow-up for DM, reported symptoms of peripheral neuropathy.    Increasing obtundation while in hospital and found to have severe diabetic foot infection with gangrene as cause of sepsis with hypotension, tachycardia, leukocytosis and elevated lactate.   Underwent transtibial amputation for gangrenous ulcers with abscess right forefoot.  Hypotension in OR requiring epinephrine following induction of anesthesia. Remained intubated and minimally responsive.  4/7 The patient is awake and eyes are open. He is intemrittently agitated. At time she is having apneic spells on CPAP trial. His tidal volunes appear to be pretty gpood and consistently >500cc.  4/8 The patient is awake. It appears that he has a diffcult time focusing and following commands. From a purely respiratory viewpt he appears ready for extubation.  4/9 Patient extubated. He looks well. He is not delirious at this point and is off precedex. Will start low Na , carb controlled diet. He has been hemodynamically stable. He is cogent and cooperative.  4/10 The patient is awake and alert today.  We are going to take out  urinary catheter. He was having major Ventriculalr ectopy last night but it appears to have largely resolved this AM. Amidarone drip slated to stop in the next several hours.  I am thinking about reinitiating his transfer.     PAST MEDICAL HISTORY :   has a past medical history of CHF (congestive heart failure) (HCC), Diabetes mellitus, and Hypertension.  has a past surgical history that includes cyst removal from  right leg about 6 years ago; left heart catheterization with coronary angiogram (N/A, 05/11/2011); and Amputation (Right, 07/02/2017). Prior to Admission medications   Medication Sig Start Date End Date Taking? Authorizing Provider  aspirin 325 MG tablet Take 325 mg by mouth daily.   Yes [provider]  atorvastatin (LIPITOR) 40 MG tablet take 1 tablet by mouth once daily Patient not taking: Reported on 12/23/2016 12/30/15   Beaulah Dinning, MD  carvedilol (COREG) 6.25 MG tablet take 1 tablet by mouth twice a day with meals Patient not taking: Reported on 12/23/2016 05/01/14   Briscoe Deutscher, DO  enalapril-hydrochlorothiazide (VASERETIC) 10-25 MG tablet take 1 tablet by mouth once daily Patient not taking: Reported on 12/23/2016 01/01/16   Beaulah Dinning, MD  metFORMIN (GLUCOPHAGE) 1000 MG tablet Take 0.5 tablets (500 mg total) by mouth 2 (two) times daily with a meal. Patient not taking: Reported on 12/23/2016 09/02/12   Brent Bulla, MD       VITAL SIGNS: Temp:  [98 F (36.7 C)-99.7 F (37.6 C)] 98 F (36.7 C) (04/10 0400) Pulse Rate:  [74-114] 75 (04/10 0700) Resp:  [11-25] 23 (04/10 0700) BP: (122-182)/(79-106) 156/98 (04/10 0700) SpO2:  [86 %-100 %] 97 % (  04/10 0700) Weight:  [221 lb 5.5 oz (100.4 kg)] 221 lb 5.5 oz (100.4 kg) (04/10 0411)  PHYSICAL EXAMINATION: General: the patient is intermittenmtly restless Cor:: RRR s1 S2 Chest bilateral BS , no major wheeze or rales.  Abdo: Soft, Large midline ventral hernia. EXTR: rIGHT bka WITH VACUUM  DRESSING NEURO: He is awake. He is in fairly7 constant motion despite the use oof Precedex. Will not really follow commands   Recent Labs  Lab 07/02/17 2041 07/03/17 0521 07/06/17 1401  NA 133* 135 139  K 4.6 4.2 3.6  CL 97* 101 104  CO2 22 24 24   BUN 22* 23* 14  CREATININE 1.26* 1.09 1.07  GLUCOSE 223* 151* 150*   Recent Labs  Lab 07/02/17 0331 07/02/17 2041 07/03/17 0521  HGB 11.8* 11.8* 11.0*  HCT 35.9* 36.1* 34.1*  WBC 15.5* 17.1* 11.8*  PLT 258 242 233   No results found.  ASSESSMENT / PLAN:  CULTURES: 4/1 BC: viridans strep 4/4 BC :NGTD  ANTIBIOTICS: Vancomycin/zosyn>>Ceftriaxone   LINES/TUBES: ETT Foley   ASSESSMENT / PLAN: 72 year old man with history of DM, mixed heart failure and HTN.  Admitted with altered mental status on 4/1, found to be in septic shock due to strep viridans bacteremia presumably due to severe diabetic foot ulcer of the right lower extremity status post below knee amputation.  Also with encephalopathy presumed metabolic.  PULMONARY  patient extubated. He is on a nasal cannula.    CARDIOVASCULAR  The patient did have a lot of ectopy last night His K was 3.6 and magnesium was normal. He does have rather impaired LV fn. ECHO done 4/6 showed LVEF of 25-30% with diffuse hypokinesis. Unclear iff some this represents a stunned myocardium secondary to recent sepsis.     - r RENAL A:   Normal renal function. Creatinione cointinues to improve. Urine output is about [phone] thepast two days     INFECTIOUS A:   Strep viridans bacteremia  P:   - continue ceftriaxone as he has improved on this but will follow up on sensitivities once available - will need 2 week course of total antibiotics, can be transitioned to oral once all evidence of active infection improving and sensitivities are available  ENDOCRINE A:   Diabetes Mellitus   P:   lantus plus sliding scale insulin.  Free T4 in the normal range. Likely Euthyroid sick. I  replaced patient on metformin at this time  NEUROLOGIC  As noted his mental status has improved. He is moving all extemites his speech is fluent and he is cogent.   R BKA As per ortho.  I believe the patient can be downgraded to the regualr medical floor.     07/07/2017, 8:50 AM

## 2017-07-07 NOTE — Evaluation (Signed)
Occupational Therapy Evaluation Patient Details Name: Miguel Harvey MRN: [id number] DOB: [birth year] Today's Date: 07/07/2017    History of Present Illness Miguel Harvey is a 72 y.o. male with medical history significant for combined systolic and diastolic CHF, diabetes, hypertension dyslipidemia, presented with an inability to walk, generalized weakness for past 2 days and a R forefoot ulceration with necrosis and foul-smelling dranage.  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  Extubaed 07/06/17.   Clinical Impression   PTA, pt was living alone and reports he was independent with ADLs, IADLs, and driving. Pt requiring Max A for UB ADLs, Max A +2 for LB ADLs, Max A +2 for squat pivot transfer to recliner. Pt presenting with poor sitting/standing balance, activity tolerance, awareness, and problem solving. Pt would benefit from further acute OT to facilitate safe dc. Recommend dc to SNF for further OT to optimize safety, independence with ADLs, and return to PLOF.      Follow Up Recommendations  SNF    Equipment Recommendations  Other (comment)(Defer to next venue)    Recommendations for Other Services PT consult     Precautions / Restrictions Precautions Precautions: Fall Restrictions Weight Bearing Restrictions: Yes RLE Weight Bearing: Non weight bearing      Mobility Bed Mobility Overal bed mobility: Needs Assistance Bed Mobility: Supine to Sit     Supine to sit: HOB elevated;Max assist;+2 for physical assistance Sit to supine: Mod assist   General bed mobility comments: Patient participated and followed commands with increased time, assist for legs to EOB and to lift trunk despite using rail and increased time  Transfers Overall transfer level: Needs assistance Equipment used: 2 person hand held assist Transfers: Squat Pivot Transfers Sit to Stand: Mod assist;+2 safety/equipment   Squat pivot transfers: Max  assist;+2 physical assistance     General transfer comment: blocked L knee and used drop arm recliner with +2 A for squat pivot, pt sat on near edge of bed/chair and cues to scoot, but difficulty sequencing and needed multimodal cues and assist for safety    Balance Overall balance assessment: Needs assistance Sitting-balance support: Bilateral upper extremity supported;Feet supported Sitting balance-Leahy Scale: Poor Sitting balance - Comments: UE support for sitting, fatigued quickly, requiring back support for safety at EOB as OT obtaining drop arm recliner, sat EOB about 8-10 minutes   Standing balance support: Bilateral upper extremity supported Standing balance-Leahy Scale: Poor Standing balance comment: NT                           ADL either performed or assessed with clinical judgement   ADL Overall ADL's : Needs assistance/impaired Eating/Feeding: Set up;Supervision/ safety;Sitting   Grooming: Set up;Supervision/safety;Sitting   Upper Body Bathing: Minimal assistance;Sitting Upper Body Bathing Details (indicate cue type and reason): Supported sitting Lower Body Bathing: Sit to/from stand;Maximal assistance;+2 for physical assistance;Sitting/lateral leans   Upper Body Dressing : Minimal assistance;Sitting Upper Body Dressing Details (indicate cue type and reason): supported sitting Lower Body Dressing: Maximal assistance;Cueing for sequencing;Sit to/from stand;Sitting/lateral leans;+2 for physical assistance   Toilet Transfer: Maximal assistance;+2 for physical assistance;Squat-pivot(Simulated to drop arm recliner) Toilet Transfer Details (indicate cue type and reason): Pt requiring Max A +2 to maintain standing and turn hips towards recliner.          Functional mobility during ADLs: Maximal assistance;+2 for physical assistance General ADL Comments: Pt with poro activity tolerance and fatiguing quickly. Requiring Max  A +2 for stand pivot and poor awarness  of safety. When discussing SNF placement, pt stating "Its what ever you say."     Vision         Perception     Praxis      Pertinent Vitals/Pain Pain Assessment: 0-10 Pain Score: 4  Pain Location: R leg Pain Descriptors / Indicators: Sore Pain Intervention(s): Monitored during session;Limited activity within patient's tolerance;Repositioned     Hand Dominance Right   Extremity/Trunk Assessment Upper Extremity Assessment Upper Extremity Assessment: Overall WFL for tasks assessed   Lower Extremity Assessment Lower Extremity Assessment: Defer to PT evaluation RLE Deficits / Details: BKA with wound vac, bends knee about 30-40 degrees, extends to full extension, lifts from hip with assistance LLE Deficits / Details: AROM WFL, strength hip flexion 2+/5, knee extension 4-/5 LLE Sensation: decreased light touch   Cervical / Trunk Assessment Cervical / Trunk Assessment: Kyphotic   Communication Communication Communication: No difficulties   Cognition Arousal/Alertness: Awake/alert Behavior During Therapy: WFL for tasks assessed/performed Overall Cognitive Status: Impaired/Different from baseline Area of Impairment: Orientation;Memory                 Orientation Level: Time;Situation   Memory: Decreased short-term memory         General Comments: Pt not recalling he has BKA surgery.    General Comments  SpO2 100% throughout, but pt requesting O2 with RR up to 23    Exercises     Shoulder Instructions      Home Living Family/patient expects to be discharged to:: Private residence Living Arrangements: Alone Available Help at Discharge: Available PRN/intermittently;Family Type of Home: Mobile home Home Access: Stairs to enter Entrance Stairs-Number of Steps: 4 Entrance Stairs-Rails: Right;Left Home Layout: One level     Bathroom Shower/Tub: Chief Strategy Officer: Standard     Home Equipment: None          Prior  Functioning/Environment Level of Independence: Independent        Comments: Pt reporting he performed ADLs, IADLs, and driving PTA. Unsure of accruacy due to confusion during eval        OT Problem List: Decreased strength;Decreased range of motion;Decreased activity tolerance;Impaired balance (sitting and/or standing);Decreased cognition;Decreased safety awareness;Decreased knowledge of use of DME or AE;Pain      OT Treatment/Interventions: Self-care/ADL training;Therapeutic exercise;Energy conservation;DME and/or AE instruction;Therapeutic activities;Patient/family education    OT Goals(Current goals can be found in the care plan section) Acute Rehab OT Goals Patient Stated Goal: Understands need for rehab OT Goal Formulation: With patient Time For Goal Achievement: 07/21/17 Potential to Achieve Goals: Good ADL Goals Pt Will Perform Upper Body Dressing: with set-up;with supervision;sitting Pt Will Perform Lower Body Dressing: with min assist;sit to/from stand;with adaptive equipment;sitting/lateral leans Pt Will Transfer to Toilet: with min assist;stand pivot transfer;bedside commode Pt Will Perform Toileting - Clothing Manipulation and hygiene: with min assist;sit to/from stand Additional ADL Goal #1: Pt will perform bed mobility with Min A in preparation for ADLs in sitting Additional ADL Goal #2: Pt will maintain sitting at EOB for 10 minutes in preparation for ADLs in sitting  OT Frequency: Min 2X/week   Barriers to D/C: Decreased caregiver support  Lives alone       Co-evaluation PT/OT/SLP Co-Evaluation/Treatment: Yes Reason for Co-Treatment: Complexity of the patient's impairments (multi-system involvement);For patient/therapist safety;To address functional/ADL transfers PT goals addressed during session: Mobility/safety with mobility;Balance;Strengthening/ROM OT goals addressed during session: ADL's and self-care  AM-PAC PT "6 Clicks" Daily Activity     Outcome  Measure Help from another person eating meals?: None Help from another person taking care of personal grooming?: A Little Help from another person toileting, which includes using toliet, bedpan, or urinal?: A Lot Help from another person bathing (including washing, rinsing, drying)?: A Lot Help from another person to put on and taking off regular upper body clothing?: A Lot Help from another person to put on and taking off regular lower body clothing?: A Lot 6 Click Score: 15   End of Session Equipment Utilized During Treatment: Gait belt;Oxygen Nurse Communication: Mobility status;Weight bearing status;Need for lift equipment  Activity Tolerance: Patient tolerated treatment well;Patient limited by fatigue Patient left: in chair;with call bell/phone within reach;with chair alarm set  OT Visit Diagnosis: Unsteadiness on feet (R26.81);Other abnormalities of gait and mobility (R26.89);Muscle weakness (generalized) (M62.81);Pain Pain - Right/Left: Right Pain - part of body: Leg                Time: 1610-9604 OT Time Calculation (min): 24 min Charges:  OT General Charges $OT Visit: 1 Visit OT Evaluation $OT Eval Moderate Complexity: 1 Mod G-Codes:     Damiyah Ditmars MSOT, OTR/L Acute Rehab Pager: [phone] Office: [phone]   Theodoro Grist Chana Lindstrom 07/07/2017, 1:45 PM

## 2017-07-07 NOTE — Progress Notes (Signed)
Patient seen and evaluated today by critical care physicians. No new changes from my standpoint. Will resume care tomorrow. Agree with transitioning to floor.  I am available for reevaluation should patient require it today. Otherwise transition to floor under plans laid out by critical care team.  Pt has no new complaints.  Gen: pt in nad, alert and awake CV: s1 and s2 present Pulm: no increased wob, no wheezes  Penny Pia MD

## 2017-07-08 LAB — CBC WITH DIFFERENTIAL/PLATELET
Basophils Absolute: 0 10*3/uL (ref 0.0–0.1)
Basophils Relative: 0 %
Eosinophils Absolute: 0 10*3/uL (ref 0.0–0.7)
Eosinophils Relative: 0 %
HEMATOCRIT: 35.1 % — AB (ref 39.0–52.0)
Hemoglobin: 11.4 g/dL — ABNORMAL LOW (ref 13.0–17.0)
LYMPHS PCT: 14 %
Lymphs Abs: 2.1 10*3/uL (ref 0.7–4.0)
MCH: 27.9 pg (ref 26.0–34.0)
MCHC: 32.5 g/dL (ref 30.0–36.0)
MCV: 86 fL (ref 78.0–100.0)
MONO ABS: 0.6 10*3/uL (ref 0.1–1.0)
Monocytes Relative: 4 %
NEUTROS ABS: 12.4 10*3/uL — AB (ref 1.7–7.7)
Neutrophils Relative %: 82 %
Platelets: 260 10*3/uL (ref 150–400)
RBC: 4.08 MIL/uL — ABNORMAL LOW (ref 4.22–5.81)
RDW: 14.3 % (ref 11.5–15.5)
WBC: 15.1 10*3/uL — ABNORMAL HIGH (ref 4.0–10.5)

## 2017-07-08 LAB — BASIC METABOLIC PANEL
Anion gap: 11 (ref 5–15)
BUN: 22 mg/dL — ABNORMAL HIGH (ref 6–20)
CALCIUM: 7.8 mg/dL — AB (ref 8.9–10.3)
CO2: 20 mmol/L — AB (ref 22–32)
Chloride: 105 mmol/L (ref 101–111)
Creatinine, Ser: 1.05 mg/dL (ref 0.61–1.24)
GFR calc Af Amer: >60 mL/min (ref >60)
GFR calc non Af Amer: >60 mL/min (ref >60)
GLUCOSE: 158 mg/dL — AB (ref 65–99)
Potassium: 3.9 mmol/L (ref 3.5–5.1)
Sodium: 136 mmol/L (ref 135–145)

## 2017-07-08 LAB — GLUCOSE, CAPILLARY
GLUCOSE-CAPILLARY: 154 mg/dL — AB (ref 65–99)
GLUCOSE-CAPILLARY: 181 mg/dL — AB (ref 65–99)
GLUCOSE-CAPILLARY: 127 mg/dL — AB (ref 65–99)
Glucose-Capillary: 127 mg/dL — ABNORMAL HIGH (ref 65–99)

## 2017-07-08 MED ORDER — AMOXICILLIN 500 MG PO CAPS
500.0000 mg | ORAL_CAPSULE | Freq: Three times a day (TID) | ORAL | Status: DC
  Administered 2017-07-09 – 2017-07-11 (×6): 500 mg via ORAL
  Filled 2017-07-08 (×9): qty 1

## 2017-07-08 NOTE — Progress Notes (Signed)
Patient ID: Miguel Harvey, male   DOB: Mar 30, 1946, 72 y.o.   MRN: [id number] Patient is alert and oriented this morning approximately 150 cc in the wound VAC canister.  Patient states that he has no vertigo.  Patient will need discharge to skilled nursing.  Wound VAC to be discontinued and a dry dressing applied at discharge.

## 2017-07-08 NOTE — Progress Notes (Signed)
PROGRESS NOTE    Miguel Harvey  ZOX:[id number] DOB: 1945-05-12 DOA: 06/28/2017 PCP: Beaulah Dinning, MD    Brief Narrative:   72 year old man with history of DM, mixed heart failure and HTN. Admitted with altered mental status on 4/1. Found crawling on the floor of his home. Generalized weakness for past 2 days. History of recurrent falls. Poor follow-up for DM, reported symptoms of peripheral neuropathy.    Assessment & Plan:   Principal Problem:   Diabetic foot infection (HCC) - continue antibiotic regimen. Discussed with pharmacy and will transition to oral regimen today. - s/p procedure by surgeon underwent  transtibial amputation for gangrenous ulcers    Active Problems:   TOBACCO DEPENDENCE   Diabetes mellitus (HCC) - SSI, metformin and lantus - carb modified diet.    HTN (hypertension), malignant - Pt on carvedilol and lisinopril, stable    Dyslipidemia   Sepsis (HCC) - resolved with amputation and antibiotics    Pressure injury of skin   DVT prophylaxis: Lovenox Code Status: Full Family Communication: none at bedside. Disposition Plan: SNF most likely tomorrow or when bed available.   Consultants:   Ortho: Dr. Lajoyce Corners  Procedures: underwent  transtibial amputation for gangrenous ulcers  Pt was intubated   Antimicrobials: amoxicillin   Subjective: Pt has no new complaints  Objective: Vitals:   07/07/17 1658 07/07/17 2027 07/08/17 0614 07/08/17 1439  BP: (!) 156/105 (!) 141/72 (!) 146/76 128/78  Pulse: 93 85 81 87  Resp: 20   16  Temp:  98.3 F (36.8 C) 97.9 F (36.6 C) 98.4 F (36.9 C)  TempSrc:  Oral Oral Oral  SpO2: 98% 100% 97% 99%  Weight:      Height:        Intake/Output Summary (Last 24 hours) at 07/08/2017 1538 Last data filed at 07/08/2017 1500 Gross per 24 hour  Intake 260 ml  Output 500 ml  Net -240 ml   Filed Weights   07/05/17 0435 07/06/17 0439 07/07/17 0411  Weight: 90.9 kg (200 lb 6.4 oz) 96.9 kg (213 lb 10 oz)  100.4 kg (221 lb 5.5 oz)    Examination:  General exam: Appears calm and comfortable, in nad.  Respiratory system: Clear to auscultation. Respiratory effort normal. Equal chest rise.  Cardiovascular system: S1 & S2 heard, RRR. No JVD, murmurs, rubs Gastrointestinal system: Abdomen is nondistended, soft and nontender. No organomegaly or masses felt. Normal bowel sounds heard. Central nervous system: Alert and oriented. No focal neurological deficits. Extremities: warm Skin: No rashes, lesions or ulcers, on limited exam. Wound vac in place at RLE Psychiatry: Mood & affect appropriate.    Data Reviewed: I have personally reviewed following labs and imaging studies  CBC: Recent Labs  Lab 07/02/17 0331 07/02/17 2041 07/03/17 0521 07/08/17 0313  WBC 15.5* 17.1* 11.8* 15.1*  NEUTROABS  --  14.4*  --  12.4*  HGB 11.8* 11.8* 11.0* 11.4*  HCT 35.9* 36.1* 34.1* 35.1*  MCV 84.9 84.3 85.0 86.0  PLT 258 242 233 260   Basic Metabolic Panel: Recent Labs  Lab 07/02/17 0331 07/02/17 1420 07/02/17 1859 07/02/17 2041 07/03/17 0521 07/03/17 1633 07/06/17 1401 07/08/17 0313  NA 130*  --   --  133* 135  --  139 136  K 3.7  --   --  4.6 4.2  --  3.6 3.9  CL 93*  --   --  97* 101  --  104 105  CO2 25  --   --  22 24  --  24 20*  GLUCOSE 182*  --   --  223* 151*  --  150* 158*  BUN 20  --   --  22* 23*  --  14 22*  CREATININE 1.13  --   --  1.26* 1.09  --  1.07 1.05  CALCIUM 8.0*  --   --  7.8* 7.7*  --  8.1* 7.8*  MG  --   --   --   --  2.2  --  2.1  --   PHOS  --  4.0 4.2  --  3.5 3.3  --   --    GFR: Estimated Creatinine Clearance: 78 mL/min (by C-G formula based on SCr of 1.05 mg/dL). Liver Function Tests: No results for input(s): AST, ALT, ALKPHOS, BILITOT, PROT, ALBUMIN in the last 168 hours. No results for input(s): LIPASE, AMYLASE in the last 168 hours. Recent Labs  Lab 07/02/17 2041  AMMONIA 27   Coagulation Profile: No results for input(s): INR, PROTIME in the last 168  hours. Cardiac Enzymes: No results for input(s): CKTOTAL, CKMB, CKMBINDEX, TROPONINI in the last 168 hours. BNP (last 3 results) No results for input(s): PROBNP in the last 8760 hours. HbA1C: No results for input(s): HGBA1C in the last 72 hours. CBG: Recent Labs  Lab 07/07/17 1150 07/07/17 1706 07/07/17 2246 07/08/17 0641 07/08/17 1115  GLUCAP 125* 142* 174* 154* 181*   Lipid Profile: No results for input(s): CHOL, HDL, LDLCALC, TRIG, CHOLHDL, LDLDIRECT in the last 72 hours. Thyroid Function Tests: No results for input(s): TSH, T4TOTAL, FREET4, T3FREE, THYROIDAB in the last 72 hours. Anemia Panel: No results for input(s): VITAMINB12, FOLATE, FERRITIN, TIBC, IRON, RETICCTPCT in the last 72 hours. Sepsis Labs: No results for input(s): PROCALCITON, LATICACIDVEN in the last 168 hours.  Recent Results (from the past 240 hour(s))  Blood Culture (routine x 2)     Status: Abnormal   Collection Time: 06/28/17  6:33 PM  Result Value Ref Range Status   Specimen Description   Final    BLOOD Performed at [facility], [address] [place], [place] [address]    Special Requests   Final    BOTTLES DRAWN AEROBIC AND ANAEROBIC Blood Culture adequate volume   Culture  Setup Time   Final    GRAM POSITIVE RODS ANAEROBIC BOTTLE ONLY CRITICAL RESULT CALLED TO, READ BACK BY AND VERIFIED WITH: J LEDFORD PHARMD 07/01/17 0001 JDW    Culture (A)  Final    DIPHTHEROIDS(CORYNEBACTERIUM SPECIES) Standardized susceptibility testing for this organism is not available. Performed at [facility], [address] [place], [place] [address]    Report Status 07/03/2017 FINAL  Final  Blood Culture (routine x 2)     Status: Abnormal   Collection Time: 06/28/17  6:33 PM  Result Value Ref Range Status   Specimen Description   Final    BLOOD BLOOD LEFT ARM Performed at [facility], [address], [place], [place] [address]    Special Requests   Final     BOTTLES DRAWN AEROBIC AND ANAEROBIC Blood Culture adequate volume Performed at [facility], [address] [place], [place] [address]    Culture  Setup Time   Final    GRAM POSITIVE COCCI IN CHAINS AEROBIC BOTTLE ONLY CRITICAL RESULT CALLED TO, READ BACK BY AND VERIFIED WITH: N. BATCHELDER PHARMD, AT 1043 06/30/17 BY D. VANHOOK    Culture (A)  Final  VIRIDANS STREPTOCOCCUS PROPIONIBACTERIUM SPECIES Standardized susceptibility testing for this organism is not available. Performed at [facility], [address] [place], [place] [address]    Report Status 07/05/2017 FINAL  Final  Blood Culture ID Panel (Reflexed)     Status: Abnormal   Collection Time: 06/28/17  6:33 PM  Result Value Ref Range Status   Enterococcus species NOT DETECTED NOT DETECTED Final   Listeria monocytogenes NOT DETECTED NOT DETECTED Final   Staphylococcus species NOT DETECTED NOT DETECTED Final   Staphylococcus aureus NOT DETECTED NOT DETECTED Final   Streptococcus species DETECTED (A) NOT DETECTED Final    Comment: Not Enterococcus species, Streptococcus agalactiae, Streptococcus pyogenes, or Streptococcus pneumoniae. CRITICAL RESULT CALLED TO, READ BACK BY AND VERIFIED WITH: N. BATCHELDER PHARMD, AT 1043 06/30/17 BY D. VANHOOK    Streptococcus agalactiae NOT DETECTED NOT DETECTED Final   Streptococcus pneumoniae NOT DETECTED NOT DETECTED Final   Streptococcus pyogenes NOT DETECTED NOT DETECTED Final   Acinetobacter baumannii NOT DETECTED NOT DETECTED Final   Enterobacteriaceae species NOT DETECTED NOT DETECTED Final   Enterobacter cloacae complex NOT DETECTED NOT DETECTED Final   Escherichia coli NOT DETECTED NOT DETECTED Final   Klebsiella oxytoca NOT DETECTED NOT DETECTED Final   Klebsiella pneumoniae NOT DETECTED NOT DETECTED Final   Proteus species NOT DETECTED NOT DETECTED Final   Serratia marcescens NOT DETECTED NOT DETECTED Final   Haemophilus influenzae NOT DETECTED NOT  DETECTED Final   Neisseria meningitidis NOT DETECTED NOT DETECTED Final   Pseudomonas aeruginosa NOT DETECTED NOT DETECTED Final   Candida albicans NOT DETECTED NOT DETECTED Final   Candida glabrata NOT DETECTED NOT DETECTED Final   Candida krusei NOT DETECTED NOT DETECTED Final   Candida parapsilosis NOT DETECTED NOT DETECTED Final   Candida tropicalis NOT DETECTED NOT DETECTED Final  Blood Culture ID Panel (Reflexed)     Status: None   Collection Time: 06/28/17  6:33 PM  Result Value Ref Range Status   Enterococcus species NOT DETECTED NOT DETECTED Final   Vancomycin resistance NOT DETECTED NOT DETECTED Final   Listeria monocytogenes NOT DETECTED NOT DETECTED Final   Staphylococcus species NOT DETECTED NOT DETECTED Final   Staphylococcus aureus NOT DETECTED NOT DETECTED Final   Methicillin resistance NOT DETECTED NOT DETECTED Final   Streptococcus species NOT DETECTED NOT DETECTED Final   Streptococcus agalactiae NOT DETECTED NOT DETECTED Final    Comment: CRITICAL RESULT CALLED TO, READ BACK BY AND VERIFIED WITH: J LEDFORD PHARMD 07/01/17 0001 JDW    Streptococcus pneumoniae NOT DETECTED NOT DETECTED Final   Streptococcus pyogenes NOT DETECTED NOT DETECTED Final   Acinetobacter baumannii NOT DETECTED NOT DETECTED Final   Enterobacteriaceae species NOT DETECTED NOT DETECTED Final   Enterobacter cloacae complex NOT DETECTED NOT DETECTED Final   Escherichia coli NOT DETECTED NOT DETECTED Final   Klebsiella oxytoca NOT DETECTED NOT DETECTED Final   Klebsiella pneumoniae NOT DETECTED NOT DETECTED Final   Proteus species NOT DETECTED NOT DETECTED Final   Serratia marcescens NOT DETECTED NOT DETECTED Final   Carbapenem resistance NOT DETECTED NOT DETECTED Final   Haemophilus influenzae NOT DETECTED NOT DETECTED Final   Neisseria meningitidis NOT DETECTED NOT DETECTED Final   Pseudomonas aeruginosa NOT DETECTED NOT DETECTED Final   Candida albicans NOT DETECTED NOT DETECTED Final    Candida glabrata NOT DETECTED NOT DETECTED Final   Candida krusei NOT DETECTED NOT DETECTED Final   Candida parapsilosis NOT DETECTED NOT DETECTED Final   Candida tropicalis  NOT DETECTED NOT DETECTED Final    Comment: Performed at [facility], [address] [place], [place] [address]  Surgical pcr screen     Status: None   Collection Time: 07/01/17  8:23 PM  Result Value Ref Range Status   MRSA, PCR NEGATIVE NEGATIVE Final   Staphylococcus aureus NEGATIVE NEGATIVE Final    Comment: (NOTE) The Xpert SA Assay (FDA approved for NASAL specimens in patients 73 years of age and older), is one component of a comprehensive surveillance program. It is not intended to diagnose infection nor to guide or monitor treatment. Performed at [facility], [address] [place], [place] [address]   Culture, blood (routine x 2)     Status: None   Collection Time: 07/02/17  2:20 PM  Result Value Ref Range Status   Specimen Description BLOOD RIGHT ANTECUBITAL  Final   Special Requests AEROBIC BOTTLE ONLY Blood Culture adequate volume  Final   Culture   Final    NO GROWTH 5 DAYS Performed at [facility], [address], [place], [place] [address]    Report Status 07/07/2017 FINAL  Final  Culture, blood (routine x 2)     Status: None   Collection Time: 07/02/17  2:25 PM  Result Value Ref Range Status   Specimen Description BLOOD BLOOD RIGHT HAND  Final   Special Requests AEROBIC BOTTLE ONLY Blood Culture adequate volume  Final   Culture   Final    NO GROWTH 5 DAYS Performed at [facility], [address] [place], [place] [address]    Report Status 07/07/2017 FINAL  Final         Radiology Studies: No results found.      Scheduled Meds: . amoxicillin  500 mg Oral Q8H  . atorvastatin  40 mg Oral Daily  . carvedilol  6.25 mg Oral BID WC  . docusate sodium  100 mg Oral BID  . enoxaparin (LOVENOX) injection  40 mg Subcutaneous Q24H  . insulin aspart  0-20  Units Subcutaneous TID WC  . insulin aspart  0-5 Units Subcutaneous QHS  . insulin glargine  10 Units Subcutaneous Daily  . lisinopril  10 mg Oral Daily  . metFORMIN  500 mg Oral BID WC  . multivitamin with minerals  1 tablet Oral Daily  . nicotine  14 mg Transdermal Daily  . nutrition supplement (JUVEN)  1 packet Oral BID BM  . tamsulosin  0.4 mg Oral Daily   Continuous Infusions: . sodium chloride    . sodium chloride 20 mL/hr at 07/07/17 1600  . dexmedetomidine (PRECEDEX) IV infusion Stopped (07/06/17 1930)  . methocarbamol (ROBAXIN)  IV       LOS: 10 days    Time spent:  35 minutes   Penny Pia, MD [facility] Pager [phone]  If 7PM-7AM, please contact night-coverage [email or web address] Password [facility] 07/08/2017, 3:38 PM

## 2017-07-09 LAB — CBC WITH DIFFERENTIAL/PLATELET
Basophils Absolute: 0 10*3/uL (ref 0.0–0.1)
Basophils Relative: 0 %
EOS ABS: 0 10*3/uL (ref 0.0–0.7)
EOS PCT: 0 %
HEMATOCRIT: 33.4 % — AB (ref 39.0–52.0)
Hemoglobin: 10.8 g/dL — ABNORMAL LOW (ref 13.0–17.0)
Lymphocytes Relative: 18 %
Lymphs Abs: 2.2 10*3/uL (ref 0.7–4.0)
MCH: 27.9 pg (ref 26.0–34.0)
MCHC: 32.3 g/dL (ref 30.0–36.0)
MCV: 86.3 fL (ref 78.0–100.0)
MONO ABS: 0.6 10*3/uL (ref 0.1–1.0)
MONOS PCT: 5 %
NEUTROS ABS: 9.7 10*3/uL — AB (ref 1.7–7.7)
Neutrophils Relative %: 77 %
PLATELETS: 247 10*3/uL (ref 150–400)
RBC: 3.87 MIL/uL — ABNORMAL LOW (ref 4.22–5.81)
RDW: 14.2 % (ref 11.5–15.5)
WBC: 12.5 10*3/uL — ABNORMAL HIGH (ref 4.0–10.5)

## 2017-07-09 LAB — BASIC METABOLIC PANEL
Anion gap: 10 (ref 5–15)
BUN: 17 mg/dL (ref 6–20)
CALCIUM: 7.9 mg/dL — AB (ref 8.9–10.3)
CO2: 21 mmol/L — AB (ref 22–32)
CREATININE: 1 mg/dL (ref 0.61–1.24)
Chloride: 107 mmol/L (ref 101–111)
GLUCOSE: 134 mg/dL — AB (ref 65–99)
Potassium: 3.8 mmol/L (ref 3.5–5.1)
Sodium: 138 mmol/L (ref 135–145)

## 2017-07-09 LAB — GLUCOSE, CAPILLARY
Glucose-Capillary: 132 mg/dL — ABNORMAL HIGH (ref 65–99)
Glucose-Capillary: 176 mg/dL — ABNORMAL HIGH (ref 65–99)
Glucose-Capillary: 137 mg/dL — ABNORMAL HIGH (ref 65–99)
Glucose-Capillary: 122 mg/dL — ABNORMAL HIGH (ref 65–99)

## 2017-07-09 NOTE — Progress Notes (Signed)
Occupational Therapy Treatment Patient Details Name: Miguel Harvey MRN: [id number] DOB: Jan 28, 1946 Today's Date: 07/09/2017    History of present illness Miguel Harvey is a 72 y.o. male with medical history significant for combined systolic and diastolic CHF, diabetes, hypertension dyslipidemia, presented with an inability to walk, generalized weakness for 2 days and a R forefoot ulceration with necrosis and foul-smelling dranage.  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  Extubaed 07/06/17.   OT comments  Pt lacks awareness to R LE with transfers. Recommend hoyer lift daily to chair x2 with staff and sitting EOB dangle x1 with staff to help with activity tolerance. Pt will require bed pan at this time due to unsafe attempts with therapy for basic transfers.    Follow Up Recommendations  SNF    Equipment Recommendations  Other (comment)    Recommendations for Other Services PT consult    Precautions / Restrictions Precautions Precautions: Fall Restrictions Weight Bearing Restrictions: Yes RLE Weight Bearing: Non weight bearing       Mobility Bed Mobility Overal bed mobility: Needs Assistance Bed Mobility: Supine to Sit;Sit to Supine     Supine to sit: HOB elevated;+2 for physical assistance;Mod assist Sit to supine: Mod assist   General bed mobility comments: cues for sequencing/technique and for use of bed rail to assist; HOB elevated; assist to bring LE to EOB, bring hips to EOB, and to elevate trunk into sitting pt very easily distracted and making jokes about "half leg" refering to the R LE  Transfers Overall transfer level: Needs assistance Equipment used: Rolling walker (2 wheeled);2 person hand held assist Transfers: Sit to/from Stand Sit to Stand: +2 safety/equipment;Max assist;From elevated surface         General transfer comment: sit to stand transfers attempted X2 trials; pt unable to get buttocks  from surface of bed; Attempted with Rw and then with +2 face to face using gait belt; pt attempting to bear weight on R LE and with heavy R lateral bias; pt returned to supine due to need for bed pan     Balance Overall balance assessment: Needs assistance Sitting-balance support: Bilateral upper extremity supported;Feet supported Sitting balance-Leahy Scale: Poor         Standing balance comment: NT                           ADL either performed or assessed with clinical judgement   ADL Overall ADL's : Needs assistance/impaired                     Lower Body Dressing: Total assistance Lower Body Dressing Details (indicate cue type and reason): don sock Toilet Transfer: (total +2 MAX (A) unsuccessful DO NOT RECOMMEND) Toilet Transfer Details (indicate cue type and reason): Pt with near fall attempting this task x 2 . recommend bed pan at this time            General ADL Comments: attempting to transfer to 3n1 and unable to complete transfer. Pt unabel to use RW to static stand.      Vision       Perception     Praxis      Cognition Arousal/Alertness: Awake/alert Behavior During Therapy: WFL for tasks assessed/performed Overall Cognitive Status: No family/caregiver present to determine baseline cognitive functioning  General Comments: pt demonstrates decr awareness to R LE with transfer. pt with near fall due to attempting to use R LE and seemed unphased by event        Exercises     Shoulder Instructions       General Comments wound vac R LE intact    Pertinent Vitals/ Pain       Pain Assessment: Faces Faces Pain Scale: Hurts little more Pain Location: R leg Pain Descriptors / Indicators: Sore Pain Intervention(s): Premedicated before session;Monitored during session;Repositioned  Home Living                                          Prior Functioning/Environment               Frequency  Min 2X/week        Progress Toward Goals  OT Goals(current goals can now be found in the care plan section)  Progress towards OT goals: Progressing toward goals  Acute Rehab OT Goals Patient Stated Goal: none stated OT Goal Formulation: With patient Time For Goal Achievement: 07/21/17 Potential to Achieve Goals: Good ADL Goals Pt Will Perform Upper Body Dressing: with set-up;with supervision;sitting Pt Will Perform Lower Body Dressing: with min assist;sit to/from stand;with adaptive equipment;sitting/lateral leans Pt Will Transfer to Toilet: with min assist;stand pivot transfer;bedside commode Pt Will Perform Toileting - Clothing Manipulation and hygiene: with min assist;sit to/from stand Additional ADL Goal #1: Pt will perform bed mobility with Min A in preparation for ADLs in sitting Additional ADL Goal #2: Pt will maintain sitting at EOB for 10 minutes in preparation for ADLs in sitting  Plan Discharge plan remains appropriate    Co-evaluation    PT/OT/SLP Co-Evaluation/Treatment: Yes Reason for Co-Treatment: Complexity of the patient's impairments (multi-system involvement);Necessary to address cognition/behavior during functional activity;For patient/therapist safety;To address functional/ADL transfers PT goals addressed during session: Mobility/safety with mobility OT goals addressed during session: ADL's and self-care;Proper use of Adaptive equipment and DME;Strengthening/ROM      AM-PAC PT "6 Clicks" Daily Activity     Outcome Measure   Help from another person eating meals?: None Help from another person taking care of personal grooming?: A Little Help from another person toileting, which includes using toliet, bedpan, or urinal?: A Lot Help from another person bathing (including washing, rinsing, drying)?: A Lot Help from another person to put on and taking off regular upper body clothing?: A Lot Help from another person to put on and taking  off regular lower body clothing?: A Lot 6 Click Score: 15    End of Session Equipment Utilized During Treatment: Gait belt  OT Visit Diagnosis: Unsteadiness on feet (R26.81);Other abnormalities of gait and mobility (R26.89);Muscle weakness (generalized) (M62.81);Pain Pain - Right/Left: Right Pain - part of body: Leg   Activity Tolerance Patient tolerated treatment well   Patient Left in bed;with call bell/phone within reach   Nurse Communication Mobility status;Weight bearing status;Precautions        Time: 9758-8325 OT Time Calculation (min): 17 min  Charges: OT General Charges $OT Visit: 1 Visit OT Treatments $Therapeutic Activity: 8-22 mins   Mateo Flow   OTR/L Pager: [phone] Office: [phone] .    Boone Master B 07/09/2017, 4:04 PM

## 2017-07-09 NOTE — Care Management (Signed)
Case manager received call from patient's daughter- Miguel Harvey, she says her dad lives alone and it is not best situation. She is in process of getting POA in place and arranging for [name] to go to SNF. Case manager informed Duwayne Heck that The Social worker will be in touch with her concerning placement for shortterm rehab. Marland Kitchen

## 2017-07-09 NOTE — Social Work (Addendum)
CSW contacted SNF [facility] to see if they can offer a SNF bed. They indicated that they will review and let CSW know.  CSW then called daughter and left message to discuss and confirm SNF offers.  1:13pm CSW received phone call from daughter to discuss SNF offers. Family accepted SNF bed offer from [facility]. CSW contacted SNF and confirmed bed offer and they will initiate Insurance Auth for placement.  CSW will continue to follow.  Keene Breath, LCSW Clinical Social Worker [phone]

## 2017-07-09 NOTE — Progress Notes (Signed)
Chaplan Grayer Sproles was called to paged to Pt.'s room for Advanced Directive Consult. There, present in the room were the patient's only daughter Miguel Harvey- [phone] and her husband.  We spoke about the Advanced Directive. All of the forms, except the last have been completed.   The Advanced Directive is in a brown envelope on the shelf in the patient's room. I will make Spiritual Care aware and this Advanced Directive is scheduled to be executed / completed  By Noon on Friday 09 July 2017   Please call Miguel Harvey [phone] when the Advanced Directive is completed.

## 2017-07-09 NOTE — Care Management Important Message (Signed)
Important Message  Patient Details  Name: Miguel Harvey MRN: [id number] Date of Birth: 1945-07-13   Medicare Important Message Given:  Yes    Dorena Bodo 07/09/2017, 2:08 PM

## 2017-07-09 NOTE — Progress Notes (Signed)
Physical Therapy Treatment Patient Details Name: LAMONTA CYPRESS MRN: [id number] DOB: 01-02-1946 Today's Date: 07/09/2017    History of Present Illness GARDINER ESPANA is a 72 y.o. male with medical history significant for combined systolic and diastolic CHF, diabetes, hypertension dyslipidemia, presented with an inability to walk, generalized weakness for 2 days and a R forefoot ulceration with necrosis and foul-smelling dranage.  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  Extubaed 07/06/17.    PT Comments    This session focused on transfers however unable to achieve OOB. Attempted sit to stands X2. Pt with tendency for R lateral lean and seemingly attempting to bear weight on R LE to stand. Pt will need hoyer lift for transfers with nursing staff. Continue to progress as tolerated with anticipated d/c to SNF for further skilled PT services.    Follow Up Recommendations  SNF;Supervision/Assistance - 24 hour     Equipment Recommendations  Rolling walker with 5" wheels    Recommendations for Other Services       Precautions / Restrictions Precautions Precautions: Fall Restrictions Weight Bearing Restrictions: Yes RLE Weight Bearing: Non weight bearing    Mobility  Bed Mobility Overal bed mobility: Needs Assistance Bed Mobility: Supine to Sit;Sit to Supine     Supine to sit: HOB elevated;+2 for physical assistance;Mod assist Sit to supine: Mod assist   General bed mobility comments: cues for sequencing/technique and for use of bed rail to assist; HOB elevated; assist to bring LE to EOB, bring hips to EOB, and to elevate trunk into sitting pt very easily distracted and making jokes about "half leg" refering to the R LE  Transfers Overall transfer level: Needs assistance Equipment used: Rolling walker (2 wheeled);2 person hand held assist Transfers: Sit to/from Stand Sit to Stand: +2 safety/equipment;Max assist;From  elevated surface         General transfer comment: sit to stand transfers attempted X2 trials; pt unable to get buttocks from surface of bed; Attempted with Rw and then with +2 face to face using gait belt; pt attempting to bear weight on R LE and with heavy R lateral bias; pt returned to supine due to need for bed pan   Ambulation/Gait             General Gait Details: not able today   Stairs             Wheelchair Mobility    Modified Rankin (Stroke Patients Only)       Balance Overall balance assessment: Needs assistance Sitting-balance support: Bilateral upper extremity supported;Feet supported Sitting balance-Leahy Scale: Poor         Standing balance comment: NT                            Cognition Arousal/Alertness: Awake/alert Behavior During Therapy: WFL for tasks assessed/performed Overall Cognitive Status: No family/caregiver present to determine baseline cognitive functioning                                 General Comments: pt demonstrates decr awareness to R LE with transfer. pt with near fall due to attempting to use R LE and seemed unphased by event      Exercises      General Comments General comments (skin integrity, edema, etc.): wound vac R LE intact      Pertinent Vitals/Pain  Pain Assessment: Faces Faces Pain Scale: Hurts little more Pain Location: R leg Pain Descriptors / Indicators: Sore Pain Intervention(s): Premedicated before session;Monitored during session;Repositioned    Home Living                      Prior Function            PT Goals (current goals can now be found in the care plan section) Acute Rehab PT Goals Patient Stated Goal: none stated PT Goal Formulation: With patient Time For Goal Achievement: 07/21/17 Potential to Achieve Goals: Fair Progress towards PT goals: Progressing toward goals    Frequency    Min 3X/week      PT Plan Current plan remains  appropriate    Co-evaluation PT/OT/SLP Co-Evaluation/Treatment: Yes Reason for Co-Treatment: Complexity of the patient's impairments (multi-system involvement);Necessary to address cognition/behavior during functional activity;For patient/therapist safety;To address functional/ADL transfers PT goals addressed during session: Mobility/safety with mobility OT goals addressed during session: ADL's and self-care;Proper use of Adaptive equipment and DME;Strengthening/ROM      AM-PAC PT "6 Clicks" Daily Activity  Outcome Measure  Difficulty turning over in bed (including adjusting bedclothes, sheets and blankets)?: Unable Difficulty moving from lying on back to sitting on the side of the bed? : Unable Difficulty sitting down on and standing up from a chair with arms (e.g., wheelchair, bedside commode, etc,.)?: Unable Help needed moving to and from a bed to chair (including a wheelchair)?: Total Help needed walking in hospital room?: Total Help needed climbing 3-5 steps with a railing? : Total 6 Click Score: 6    End of Session Equipment Utilized During Treatment: Gait belt Activity Tolerance: Patient tolerated treatment well Patient left: with call bell/phone within reach;in bed;with bed alarm set Nurse Communication: Mobility status PT Visit Diagnosis: Difficulty in walking, not elsewhere classified (R26.2);Muscle weakness (generalized) (M62.81);Unsteadiness on feet (R26.81)     Time: 3967-2897 PT Time Calculation (min) (ACUTE ONLY): 17 min  Charges:                       G CodesErline Levine, PTA Pager: [phone]     Carolynne Edouard 07/09/2017, 4:44 PM

## 2017-07-09 NOTE — Social Work (Signed)
CSW was advised by SNF that they have insurance Auth for SNF placement at [facility].    CSW will f/u for disposition.  Keene Breath, LCSW Clinical Social Worker [phone]

## 2017-07-09 NOTE — Progress Notes (Signed)
Competed AD forms with notary and witnesses.  One copy placed in patient file and original and 2 copies in envelope to go with patient when he moves from the hospital.Miguel Harvey Michaelle Copas, Chaplain   07/09/17 1400  Clinical Encounter Type  Visited With Patient  Visit Type Follow-up;Other (Comment) (Completed Advanced Directive)  Referral From Chaplain  Consult/Referral To Chaplain  Spiritual Encounters  Spiritual Needs Prayer  Stress Factors  Patient Stress Factors None identified  Family Stress Factors None identified  Advance Directives (For Healthcare)  Does Patient Have a Medical Advance Directive? Yes

## 2017-07-09 NOTE — Progress Notes (Signed)
Patient refused his bedtime medications.

## 2017-07-09 NOTE — Progress Notes (Signed)
PROGRESS NOTE    Miguel Harvey  ZOX:[id number] DOB: 1946-02-19 DOA: 06/28/2017 PCP: Beaulah Dinning, MD    Brief Narrative:   72 year old man with history of DM, mixed heart failure and HTN. Admitted with altered mental status on 4/1. Found crawling on the floor of his home. Generalized weakness for past 2 days. History of recurrent falls. Poor follow-up for DM, reported symptoms of peripheral neuropathy.    Assessment & Plan:   Principal Problem:   Diabetic foot infection (HCC) - continue current antibiotic regimen. Currently looking for SNF placement. - s/p procedure by surgeon underwent  transtibial amputation for gangrenous ulcers   Active Problems:   TOBACCO DEPENDENCE   Diabetes mellitus (HCC) - SSI, metformin and lantus - carb modified diet.    HTN (hypertension), malignant - Pt on carvedilol and lisinopril, stable    Dyslipidemia   Sepsis (HCC) - resolved with amputation and antibiotics    Pressure injury of skin   DVT prophylaxis: Lovenox Code Status: Full Family Communication: none at bedside. Disposition Plan: SNF most likely tomorrow or when bed available. Social worker assisting with finding placement.    Consultants:   Ortho: Dr. Lajoyce Corners  Procedures: underwent  transtibial amputation for gangrenous ulcers  Pt was intubated   Antimicrobials: amoxicillin   Subjective: No new problems reported.  Objective: Vitals:   07/08/17 0614 07/08/17 1439 07/08/17 2006 07/09/17 0646  BP: (!) 146/76 128/78 (!) 144/93 140/88  Pulse: 81 87 80 75  Resp:  16 20 17   Temp: 97.9 F (36.6 C) 98.4 F (36.9 C) 98.6 F (37 C) 98.9 F (37.2 C)  TempSrc: Oral Oral Axillary Oral  SpO2: 97% 99% 100% 95%  Weight:      Height:        Intake/Output Summary (Last 24 hours) at 07/09/2017 1310 Last data filed at 07/09/2017 0900 Gross per 24 hour  Intake 600 ml  Output 2400 ml  Net -1800 ml   Filed Weights   07/05/17 0435 07/06/17 0439 07/07/17 0411  Weight:  90.9 kg (200 lb 6.4 oz) 96.9 kg (213 lb 10 oz) 100.4 kg (221 lb 5.5 oz)    Examination:  General exam: Appears calm and comfortable, in nad.  Respiratory system: Clear to auscultation. Respiratory effort normal. Equal chest rise.  Cardiovascular system: S1 & S2 heard, RRR. No JVD, murmurs, rubs Gastrointestinal system: Abdomen is nondistended, soft and nontender. No organomegaly or masses felt. Normal bowel sounds heard. Central nervous system: Alert and oriented. No focal neurological deficits. Extremities: warm Skin: No rashes, lesions or ulcers, on limited exam. Wound vac in place at RLE Psychiatry: Mood & affect appropriate.    Data Reviewed: I have personally reviewed following labs and imaging studies  CBC: Recent Labs  Lab 07/02/17 2041 07/03/17 0521 07/08/17 0313 07/09/17 0726  WBC 17.1* 11.8* 15.1* 12.5*  NEUTROABS 14.4*  --  12.4* 9.7*  HGB 11.8* 11.0* 11.4* 10.8*  HCT 36.1* 34.1* 35.1* 33.4*  MCV 84.3 85.0 86.0 86.3  PLT 242 233 260 247   Basic Metabolic Panel: Recent Labs  Lab 07/02/17 1420 07/02/17 1859 07/02/17 2041 07/03/17 0521 07/03/17 1633 07/06/17 1401 07/08/17 0313 07/09/17 0726  NA  --   --  133* 135  --  139 136 138  K  --   --  4.6 4.2  --  3.6 3.9 3.8  CL  --   --  97* 101  --  104 105 107  CO2  --   --  22 24  --  24 20* 21*  GLUCOSE  --   --  223* 151*  --  150* 158* 134*  BUN  --   --  22* 23*  --  14 22* 17  CREATININE  --   --  1.26* 1.09  --  1.07 1.05 1.00  CALCIUM  --   --  7.8* 7.7*  --  8.1* 7.8* 7.9*  MG  --   --   --  2.2  --  2.1  --   --   PHOS 4.0 4.2  --  3.5 3.3  --   --   --    GFR: Estimated Creatinine Clearance: 81.9 mL/min (by C-G formula based on SCr of 1 mg/dL). Liver Function Tests: No results for input(s): AST, ALT, ALKPHOS, BILITOT, PROT, ALBUMIN in the last 168 hours. No results for input(s): LIPASE, AMYLASE in the last 168 hours. Recent Labs  Lab 07/02/17 2041  AMMONIA 27   Coagulation Profile: No  results for input(s): INR, PROTIME in the last 168 hours. Cardiac Enzymes: No results for input(s): CKTOTAL, CKMB, CKMBINDEX, TROPONINI in the last 168 hours. BNP (last 3 results) No results for input(s): PROBNP in the last 8760 hours. HbA1C: No results for input(s): HGBA1C in the last 72 hours. CBG: Recent Labs  Lab 07/08/17 1115 07/08/17 1620 07/08/17 2200 07/09/17 0645 07/09/17 1148  GLUCAP 181* 127* 127* 132* 176*   Lipid Profile: No results for input(s): CHOL, HDL, LDLCALC, TRIG, CHOLHDL, LDLDIRECT in the last 72 hours. Thyroid Function Tests: No results for input(s): TSH, T4TOTAL, FREET4, T3FREE, THYROIDAB in the last 72 hours. Anemia Panel: No results for input(s): VITAMINB12, FOLATE, FERRITIN, TIBC, IRON, RETICCTPCT in the last 72 hours. Sepsis Labs: No results for input(s): PROCALCITON, LATICACIDVEN in the last 168 hours.  Recent Results (from the past 240 hour(s))  Surgical pcr screen     Status: None   Collection Time: 07/01/17  8:23 PM  Result Value Ref Range Status   MRSA, PCR NEGATIVE NEGATIVE Final   Staphylococcus aureus NEGATIVE NEGATIVE Final    Comment: (NOTE) The Xpert SA Assay (FDA approved for NASAL specimens in patients 58 years of age and older), is one component of a comprehensive surveillance program. It is not intended to diagnose infection nor to guide or monitor treatment. Performed at [facility], [address], [place], [place] [address]   Culture, blood (routine x 2)     Status: None   Collection Time: 07/02/17  2:20 PM  Result Value Ref Range Status   Specimen Description BLOOD RIGHT ANTECUBITAL  Final   Special Requests AEROBIC BOTTLE ONLY Blood Culture adequate volume  Final   Culture   Final    NO GROWTH 5 DAYS Performed at [facility], [address], [place], [place] [address]    Report Status 07/07/2017 FINAL  Final  Culture, blood (routine x 2)     Status: None   Collection Time: 07/02/17  2:25 PM  Result Value  Ref Range Status   Specimen Description BLOOD BLOOD RIGHT HAND  Final   Special Requests AEROBIC BOTTLE ONLY Blood Culture adequate volume  Final   Culture   Final    NO GROWTH 5 DAYS Performed at [facility], [address], [place], [place] [address]    Report Status 07/07/2017 FINAL  Final         Radiology Studies: No results found.  Scheduled Meds: . amoxicillin  500 mg Oral Q8H  . atorvastatin  40 mg Oral Daily  . carvedilol  6.25 mg Oral BID WC  . docusate sodium  100 mg Oral BID  . enoxaparin (LOVENOX) injection  40 mg Subcutaneous Q24H  . insulin aspart  0-20 Units Subcutaneous TID WC  . insulin aspart  0-5 Units Subcutaneous QHS  . insulin glargine  10 Units Subcutaneous Daily  . lisinopril  10 mg Oral Daily  . metFORMIN  500 mg Oral BID WC  . multivitamin with minerals  1 tablet Oral Daily  . nicotine  14 mg Transdermal Daily  . nutrition supplement (JUVEN)  1 packet Oral BID BM  . tamsulosin  0.4 mg Oral Daily   Continuous Infusions: . sodium chloride    . sodium chloride 20 mL/hr at 07/07/17 1600  . dexmedetomidine (PRECEDEX) IV infusion Stopped (07/06/17 1930)  . methocarbamol (ROBAXIN)  IV       LOS: 11 days    Time spent:  35 minutes   Penny Pia, MD [facility] Pager [phone]  If 7PM-7AM, please contact night-coverage [email or web address] Password [email or web address] 07/09/2017, 1:10 PM

## 2017-07-10 LAB — CBC WITH DIFFERENTIAL/PLATELET
Basophils Absolute: 0 10*3/uL (ref 0.0–0.1)
Basophils Relative: 0 %
EOS ABS: 0 10*3/uL (ref 0.0–0.7)
EOS PCT: 0 %
HCT: 33.2 % — ABNORMAL LOW (ref 39.0–52.0)
Hemoglobin: 10.5 g/dL — ABNORMAL LOW (ref 13.0–17.0)
LYMPHS ABS: 2.5 10*3/uL (ref 0.7–4.0)
LYMPHS PCT: 21 %
MCH: 27.3 pg (ref 26.0–34.0)
MCHC: 31.6 g/dL (ref 30.0–36.0)
MCV: 86.2 fL (ref 78.0–100.0)
MONO ABS: 0.5 10*3/uL (ref 0.1–1.0)
Monocytes Relative: 4 %
Neutro Abs: 8.8 10*3/uL — ABNORMAL HIGH (ref 1.7–7.7)
Neutrophils Relative %: 75 %
PLATELETS: 279 10*3/uL (ref 150–400)
RBC: 3.85 MIL/uL — ABNORMAL LOW (ref 4.22–5.81)
RDW: 14.1 % (ref 11.5–15.5)
WBC: 11.8 10*3/uL — ABNORMAL HIGH (ref 4.0–10.5)

## 2017-07-10 LAB — BASIC METABOLIC PANEL
Anion gap: 8 (ref 5–15)
BUN: 16 mg/dL (ref 6–20)
CALCIUM: 8 mg/dL — AB (ref 8.9–10.3)
CO2: 26 mmol/L (ref 22–32)
CREATININE: 0.91 mg/dL (ref 0.61–1.24)
Chloride: 105 mmol/L (ref 101–111)
GFR calc Af Amer: >60 mL/min (ref >60)
Glucose, Bld: 99 mg/dL (ref 65–99)
Potassium: 3.5 mmol/L (ref 3.5–5.1)
SODIUM: 139 mmol/L (ref 135–145)

## 2017-07-10 LAB — GLUCOSE, CAPILLARY
Glucose-Capillary: 87 mg/dL (ref 65–99)
Glucose-Capillary: 149 mg/dL — ABNORMAL HIGH (ref 65–99)
Glucose-Capillary: 75 mg/dL (ref 65–99)
Glucose-Capillary: 145 mg/dL — ABNORMAL HIGH (ref 65–99)

## 2017-07-10 NOTE — Progress Notes (Signed)
Patient ID: Miguel Harvey, male   DOB: [birth year], 72 y.o.   MRN: [id number] Patient without complaints this morning status post transtibial amputation.  Plan for discharge to skilled nursing.

## 2017-07-10 NOTE — Progress Notes (Signed)
Patients daughter Mrs. Monroe came to the bedside on tonight from [place], [place] where she reports that her father was supposed to have been transported for discharge on today to [facility]. States that she is power of attorney and that she agrees to her father signing the discharge forms despite his altered mentation because she will be unable to be at the bedside when he discharges because she will be out of town. She confirms that her father knows what is going on and knows that he is going to a facility. States that she will be on a flight in the morning and will be unable to answer her phone from 0900-1100. States that she would like the Hospitalist doctor to call her with a definitive plan for discharge concerning her father. States that she would like to know if her father will discharge on Sunday or Monday so that she can be prepared as well as have time to call the facility, [facility], to see if they will continue to hold the bed for her father for at least 1 to 2 more days. States she would like the doctor to call her tomorrow with complete update.

## 2017-07-10 NOTE — Progress Notes (Signed)
PROGRESS NOTE    Miguel Harvey  ZOX:[id number] DOB: 1945-09-19 DOA: 06/28/2017 PCP: Beaulah Dinning, MD    Brief Narrative:   72 year old man with history of DM, mixed heart failure and HTN. Admitted with altered mental status on 4/1. Found crawling on the floor of his home. Generalized weakness for past 2 days. History of recurrent falls. Poor follow-up for DM, reported symptoms of peripheral neuropathy.     Assessment & Plan:   Principal Problem:   Diabetic foot infection (HCC) - continue current antibiotic regimen. Currently looking for SNF placement. - s/p procedure by surgeon underwent  transtibial amputation for gangrenous ulcers   Active Problems:   TOBACCO DEPENDENCE   Diabetes mellitus (HCC) - SSI, metformin and lantus - carb modified diet.    HTN (hypertension), malignant - Pt on carvedilol and lisinopril, stable    Dyslipidemia   Sepsis (HCC) - resolved with amputation and antibiotics    Pressure injury of skin   DVT prophylaxis: Lovenox Code Status: Full Family Communication: none at bedside. Disposition Plan: SNF when bed available.   Consultants:   Ortho: Dr. Lajoyce Corners  Procedures: underwent  transtibial amputation for gangrenous ulcers  Pt was intubated   Antimicrobials: amoxicillin   Subjective: Pt has no new complaints currently.  Objective: Vitals:   07/09/17 1425 07/09/17 2150 07/10/17 0409 07/10/17 0500  BP: 126/73 135/80 138/82   Pulse: 75 83 68   Resp: 16 17 16    Temp: 97.9 F (36.6 C) 98 F (36.7 C) 97.6 F (36.4 C)   TempSrc: Oral Oral Oral   SpO2: (!) 66% 99% 100%   Weight:    100.6 kg (221 lb 12.5 oz)  Height:        Intake/Output Summary (Last 24 hours) at 07/10/2017 1315 Last data filed at 07/09/2017 1800 Gross per 24 hour  Intake 240 ml  Output 0 ml  Net 240 ml   Filed Weights   07/06/17 0439 07/07/17 0411 07/10/17 0500  Weight: 96.9 kg (213 lb 10 oz) 100.4 kg (221 lb 5.5 oz) 100.6 kg (221 lb 12.5 oz)     Examination:  General exam: pt in nad, alert and awake Respiratory system: equal chest rise, no wheezes Cardiovascular system: S1 & S2 heard, RRR. No JVD, murmurs, rubs Gastrointestinal system: Abdomen is nondistended, soft and nontender. No organomegaly or masses felt. Normal bowel sounds heard. Central nervous system: Alert and oriented. No focal neurological deficits. Extremities: warm Skin: No rashes, lesions or ulcers, on limited exam. Wound vac in place at RLE Psychiatry: Mood & affect appropriate.    Data Reviewed: I have personally reviewed following labs and imaging studies  CBC: Recent Labs  Lab 07/08/17 0313 07/09/17 0726 07/10/17 0541  WBC 15.1* 12.5* 11.8*  NEUTROABS 12.4* 9.7* 8.8*  HGB 11.4* 10.8* 10.5*  HCT 35.1* 33.4* 33.2*  MCV 86.0 86.3 86.2  PLT 260 247 279   Basic Metabolic Panel: Recent Labs  Lab 07/03/17 1633 07/06/17 1401 07/08/17 0313 07/09/17 0726 07/10/17 0541  NA  --  139 136 138 139  K  --  3.6 3.9 3.8 3.5  CL  --  104 105 107 105  CO2  --  24 20* 21* 26  GLUCOSE  --  150* 158* 134* 99  BUN  --  14 22* 17 16  CREATININE  --  1.07 1.05 1.00 0.91  CALCIUM  --  8.1* 7.8* 7.9* 8.0*  MG  --  2.1  --   --   --  PHOS 3.3  --   --   --   --    GFR: Estimated Creatinine Clearance: 90.1 mL/min (by C-G formula based on SCr of 0.91 mg/dL). Liver Function Tests: No results for input(s): AST, ALT, ALKPHOS, BILITOT, PROT, ALBUMIN in the last 168 hours. No results for input(s): LIPASE, AMYLASE in the last 168 hours. No results for input(s): AMMONIA in the last 168 hours. Coagulation Profile: No results for input(s): INR, PROTIME in the last 168 hours. Cardiac Enzymes: No results for input(s): CKTOTAL, CKMB, CKMBINDEX, TROPONINI in the last 168 hours. BNP (last 3 results) No results for input(s): PROBNP in the last 8760 hours. HbA1C: No results for input(s): HGBA1C in the last 72 hours. CBG: Recent Labs  Lab 07/09/17 0645  07/09/17 1148 07/09/17 1724 07/09/17 2127 07/10/17 0616  GLUCAP 132* 176* 137* 122* 87   Lipid Profile: No results for input(s): CHOL, HDL, LDLCALC, TRIG, CHOLHDL, LDLDIRECT in the last 72 hours. Thyroid Function Tests: No results for input(s): TSH, T4TOTAL, FREET4, T3FREE, THYROIDAB in the last 72 hours. Anemia Panel: No results for input(s): VITAMINB12, FOLATE, FERRITIN, TIBC, IRON, RETICCTPCT in the last 72 hours. Sepsis Labs: No results for input(s): PROCALCITON, LATICACIDVEN in the last 168 hours.  Recent Results (from the past 240 hour(s))  Surgical pcr screen     Status: None   Collection Time: 07/01/17  8:23 PM  Result Value Ref Range Status   MRSA, PCR NEGATIVE NEGATIVE Final   Staphylococcus aureus NEGATIVE NEGATIVE Final    Comment: (NOTE) The Xpert SA Assay (FDA approved for NASAL specimens in patients 41 years of age and older), is one component of a comprehensive surveillance program. It is not intended to diagnose infection nor to guide or monitor treatment. Performed at [facility], [address], [place], [place] [address]   Culture, blood (routine x 2)     Status: None   Collection Time: 07/02/17  2:20 PM  Result Value Ref Range Status   Specimen Description BLOOD RIGHT ANTECUBITAL  Final   Special Requests AEROBIC BOTTLE ONLY Blood Culture adequate volume  Final   Culture   Final    NO GROWTH 5 DAYS Performed at [facility], [address] [place], [place] [address]    Report Status 07/07/2017 FINAL  Final  Culture, blood (routine x 2)     Status: None   Collection Time: 07/02/17  2:25 PM  Result Value Ref Range Status   Specimen Description BLOOD BLOOD RIGHT HAND  Final   Special Requests AEROBIC BOTTLE ONLY Blood Culture adequate volume  Final   Culture   Final    NO GROWTH 5 DAYS Performed at [facility], [address] [place], [place] [address]    Report Status 07/07/2017 FINAL  Final         Radiology  Studies: No results found.      Scheduled Meds: . amoxicillin  500 mg Oral Q8H  . atorvastatin  40 mg Oral Daily  . carvedilol  6.25 mg Oral BID WC  . docusate sodium  100 mg Oral BID  . enoxaparin (LOVENOX) injection  40 mg Subcutaneous Q24H  . insulin aspart  0-20 Units Subcutaneous TID WC  . insulin aspart  0-5 Units Subcutaneous QHS  . insulin glargine  10 Units Subcutaneous Daily  . lisinopril  10 mg Oral Daily  . metFORMIN  500 mg Oral BID WC  . multivitamin with minerals  1 tablet Oral  Daily  . nicotine  14 mg Transdermal Daily  . nutrition supplement (JUVEN)  1 packet Oral BID BM  . tamsulosin  0.4 mg Oral Daily   Continuous Infusions: . sodium chloride    . sodium chloride 20 mL/hr at 07/07/17 1600  . dexmedetomidine (PRECEDEX) IV infusion Stopped (07/06/17 1930)  . methocarbamol (ROBAXIN)  IV       LOS: 12 days    Time spent:  35 minutes   Penny Pia, MD [facility] Pager [phone]  If 7PM-7AM, please contact night-coverage [email or web address] Password [email or web address] 07/10/2017, 1:15 PM

## 2017-07-10 NOTE — Plan of Care (Signed)
Problem: Clinical Measurements: Goal: Respiratory complications will improve Outcome: Progressing   Pt does not appear to be in respiratory distress at this time. No oxygen requirements at this time. Will continue to monitor.

## 2017-07-10 NOTE — Progress Notes (Signed)
RN received call from patients daughter, Mrs. Archie Patten, who states that she is currently at [facility] in [place], [place] awaiting the arrival of her father. Daughter states that she came to the hospital on yesterday and spoke with the doctor and the caseworker and that she also signed paperwork in preparation of her fathers release. Family requests call from doctor in the morning with an updated plan of care and expected discharge date. Please call [phone] to speak with patients daughter. Thanks.

## 2017-07-11 LAB — GLUCOSE, CAPILLARY
GLUCOSE-CAPILLARY: 79 mg/dL (ref 65–99)
GLUCOSE-CAPILLARY: 118 mg/dL — AB (ref 65–99)

## 2017-07-11 MED ORDER — AMOXICILLIN 500 MG PO CAPS: 500.0000 mg | ORAL_CAPSULE | Freq: Three times a day (TID) | ORAL | 0 refills | Status: AC

## 2017-07-11 MED ORDER — TAMSULOSIN HCL 0.4 MG PO CAPS: 0.4000 mg | ORAL_CAPSULE | Freq: Every day | ORAL | 0 refills | Status: DC

## 2017-07-11 MED ORDER — LISINOPRIL 10 MG PO TABS: 10.0000 mg | ORAL_TABLET | Freq: Every day | ORAL | 0 refills | Status: DC

## 2017-07-11 NOTE — Discharge Summary (Signed)
Physician Discharge Summary  Miguel Harvey WYS:[id number] DOB: 05-23-1945 DOA: 06/28/2017  PCP: Beaulah Dinning, MD  Admit date: 06/28/2017 Discharge date: 07/11/2017  Time spent: 36 minutes  Recommendations for Outpatient Follow-up:  1. Continue routine wound care as per ortho recommendations last seen by Dr. Lajoyce Corners   Discharge Diagnoses:  Principal Problem:   Diabetic foot infection (HCC) Active Problems:   TOBACCO DEPENDENCE   Diabetes mellitus (HCC)   HTN (hypertension), malignant   Dyslipidemia   Sepsis (HCC)   Elevated troponin   Pressure injury of skin   Discharge Condition: stable  Diet recommendation: Heart healthy  Filed Weights   07/06/17 0439 07/07/17 0411 07/10/17 0500  Weight: 96.9 kg (213 lb 10 oz) 100.4 kg (221 lb 5.5 oz) 100.6 kg (221 lb 12.5 oz)    History of present illness:  72 year old man with history of DM, mixed heart failure and HTN. Admitted with altered mental status on 4/1. Found crawling on the floor of his home. Generalized weakness for past 2 days. History of recurrent falls. Poor follow-up for DM, reported symptoms of peripheral neuropathy.   Hospital Course:  Principal Problem:   Diabetic foot infection (HCC) - Pt will be discharged on amoxicillin to complete an appropriate antibiotic therapy.  - s/p procedure by surgeon underwent transtibial amputation for gangrenous ulcers  - d/c to SNF  Active Problems:   TOBACCO DEPENDENCE   Diabetes mellitus (HCC) - carb modified diet and metformin on d/c    HTN (hypertension), malignant - Pt on carvedilol and lisinopril    Dyslipidemia   Sepsis (HCC) - resolved with amputation and antibiotics    Pressure injury of skin - agree with nursing notes. Continue routine wound care  Procedures:  Transtibial amputation   Consultations:  Dr. Lajoyce Corners (ortho)  Discharge Exam: Vitals:   07/10/17 2031 07/11/17 0448  BP: 125/80 135/84  Pulse: 81 71  Resp:    Temp: 98.5 F (36.9 C)  97.9 F (36.6 C)  SpO2: 99% 94%    General: Pt in nad, alert and awake Cardiovascular: rrr, no rubs Respiratory: no increased wob, no wheezes  Discharge Instructions   Discharge Instructions    Call MD for:  severe uncontrolled pain   Complete by:  As directed    Call MD for:  temperature >100.4   Complete by:  As directed    Diet - low sodium heart healthy   Complete by:  As directed    Discharge instructions   Complete by:  As directed    Please ensure patient follows up with Orthopaedic specialist Dr. Lajoyce Corners after hospital discharge.   Increase activity slowly   Complete by:  As directed      Allergies as of 07/11/2017   No Known Allergies     Medication List    STOP taking these medications   aspirin 325 MG tablet   enalapril-hydrochlorothiazide 10-25 MG tablet Commonly known as:  VASERETIC     TAKE these medications   amoxicillin 500 MG capsule Commonly known as:  AMOXIL Take 1 capsule (500 mg total) by mouth every 8 (eight) hours for 2 days.   atorvastatin 40 MG tablet Commonly known as:  LIPITOR take 1 tablet by mouth once daily   carvedilol 6.25 MG tablet Commonly known as:  COREG take 1 tablet by mouth twice a day with meals   lisinopril 10 MG tablet Commonly known as:  PRINIVIL,ZESTRIL Take 1 tablet (10 mg total) by mouth daily. Start taking  on:  07/12/2017   metFORMIN 1000 MG tablet Commonly known as:  GLUCOPHAGE Take 0.5 tablets (500 mg total) by mouth 2 (two) times daily with a meal.   tamsulosin 0.4 MG Caps capsule Commonly known as:  FLOMAX Take 1 capsule (0.4 mg total) by mouth daily. Start taking on:  07/12/2017      No Known Allergies  Contact information for follow-up providers    Nadara Mustard, MD In 1 week.   Specialty:  Orthopedic Surgery Contact information: [address] [place] [address] [phone]            Contact information for after-discharge care    Destination    [facility] .   Service:  Skilled Nursing Contact information: [address] [place] [address] [phone]                   The results of significant diagnostics from this hospitalization (including imaging, microbiology, ancillary and laboratory) are listed below for reference.    Significant Diagnostic Studies: Dg Chest 2 View  Result Date: 06/28/2017 CLINICAL DATA:  History of smoking and congestive heart failure. EXAM: CHEST - 2 VIEW COMPARISON:  05/08/2011; chest CT - 05/17/2012 FINDINGS: Grossly unchanged cardiac silhouette and mediastinal contours. Minimal bilateral infrahilar opacities are unchanged and favored to represent atelectasis. No new focal airspace opacities. No pleural effusion or pneumothorax. No evidence of edema. No acute osseous abnormalities. Stigmata of DISH within the thoracic spine. IMPRESSION: No acute cardiopulmonary disease. Electronically Signed   By: Simonne Come M.D.   On: 06/28/2017 19:30   Ct Head W & Wo Contrast  Result Date: 07/02/2017 CLINICAL DATA:  Altered mental status, possible metabolic encephalopathy. Assess for stroke or abscess; forefoot abscess. History of hypertension, diabetes. EXAM: CT HEAD WITHOUT AND WITH CONTRAST TECHNIQUE: Contiguous axial images were obtained from the base of the skull through the vertex without and with intravenous contrast CONTRAST:  ISOVUE-300 IOPAMIDOL (ISOVUE-300) INJECTION 61% COMPARISON:  None. FINDINGS: BRAIN: No intraparenchymal hemorrhage, mass effect nor midline shift. The ventricles and sulci are normal for age. Patchy supratentorial white matter hypodensities less than expected for patient's age, though non-specific are most compatible with chronic small vessel ischemic disease. No acute large vascular territory infarcts. No abnormal extra-axial fluid collections. Basal cisterns are patent. No abnormal parenchymal or extra-axial enhancement. VASCULAR: Mild calcific atherosclerosis of the  carotid siphons. SKULL: No skull fracture. No significant scalp soft tissue swelling. SINUSES/ORBITS: Trace LEFT mastoid effusion without air cell coalescence. Paranasal sinuses are well aerated.The included ocular globes and orbital contents are non-suspicious. OTHER: None. IMPRESSION: Negative CT HEAD with and without contrast for age. Electronically Signed   By: Awilda Metro M.D.   On: 07/02/2017 16:01   Dg Chest Port 1 View  Result Date: 07/02/2017 CLINICAL DATA:  Endotracheal tube placement. EXAM: PORTABLE CHEST 1 VIEW COMPARISON:  Radiographs of June 28, 2017. FINDINGS: Stable cardiomediastinal silhouette. Endotracheal tube is seen projected over tracheal air shadow with distal tip 3 cm above the carina. No pneumothorax or pleural effusion is noted. No acute pulmonary disease is noted. Bony thorax is unremarkable. IMPRESSION: Endotracheal tube in grossly good position. No acute cardiopulmonary abnormality seen. Electronically Signed   By: Lupita Raider, M.D.   On: 07/02/2017 12:52   Dg Abd Portable 1v  Result Date: 07/02/2017 CLINICAL DATA:  Orogastric tube placement EXAM: PORTABLE ABDOMEN - 1 VIEW COMPARISON:  None. FINDINGS: In orogastric tube coils once in  the stomach and terminates in the stomach body oriented towards the fundus. Thoracolumbar spondylosis. IMPRESSION: 1. OG tube tip: Stomach body, oriented towards the fundus. Electronically Signed   By: Gaylyn Rong M.D.   On: 07/02/2017 19:36   Dg Foot Complete Left  Result Date: 06/28/2017 CLINICAL DATA:  Necrosis involving the bilateral feet EXAM: LEFT FOOT - COMPLETE 3+ VIEW COMPARISON:  None. FINDINGS: There is absence of near the entirety of the fifth digit with only minimal amount of the base of the proximal phalanx remaining. No fracture or dislocation. Mild degenerative change involving the first MTP joint. No significant hallux valgus deformity. No erosions. Small plantar calcaneal spur. Enthesopathic change involving the  Achilles tendon insertion site. 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 Further evaluation with MRI could performed as indicated. Electronically Signed   By: Simonne Come M.D.   On: 06/28/2017 19:35   Dg Foot Complete Right  Result Date: 06/28/2017 CLINICAL DATA:  Necrosis of the bilateral feet. EXAM: RIGHT FOOT COMPLETE - 3+ VIEW COMPARISON:  None. FINDINGS: No fracture or dislocation. There is an apparent ulcer involving weight-bearing surface about the head of the first metatarsal. Minimal amount of adjacent subcutaneous emphysema. No definite radiopaque foreign body. No discrete areas of osteolysis to suggest osteomyelitis. 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 Note is made of an os tibialis externum. Small plantar calcaneal spur. Minimal enthesopathic change involving the Achilles tendon insertion site. IMPRESSION: Suspected ulcer about the weight-bearing surface about the head of the first metatarsal with adjacent subcutaneous emphysema but without radiographic evidence of osteomyelitis. Further evaluation with MRI could be performed as clinically Electronically Signed   By: Simonne Come M.D.   On: 06/28/2017 19:33   Mr Brain Limited Wo Contrast  Result Date: 07/02/2017 CLINICAL DATA:  Altered level of consciousness, somnolent. Evaluate encephalopathy. History of forefoot abscess, hypertension, hyperlipidemia. EXAM: MRI HEAD WITHOUT CONTRAST TECHNIQUE: Multiplanar, multiecho pulse sequences of the brain and surrounding structures were obtained without intravenous contrast. COMPARISON:  CT HEAD July 02, 2017. FINDINGS: Sequences are moderately to severely motion degraded. INTRACRANIAL CONTENTS: No reduced diffusion to suggest acute ischemia, typical infection or hypercellular tumor. Nondiagnostic coronal diffusion weighted imaging. Nondiagnostic hemosiderin sensitive sequence. Patchy to confluent supratentorial white matter FLAIR T2  hyperintensities. The ventricles and sulci are normal for patient's age. No suspicious parenchymal signal, masses, mass effect. No abnormal extra-axial fluid collections. No extra-axial masses. VASCULAR: Normal major intracranial vascular flow voids present at skull base. SKULL AND UPPER CERVICAL SPINE: Nondiagnostic assessment due to severely motion degraded sagittal T1. SINUSES/ORBITS: The mastoid air-cells and included paranasal sinuses are well-aerated.The included ocular globes and orbital contents are non-suspicious. OTHER: 3.1 cm T2 hyperintensity LEFT maxilla, possible incisive foramen cyst. IMPRESSION: 1. Moderate to severely motion degraded examination without acute intracranial process. 2. Grossly negative noncontrast MRI of the head for age. 3. 3.1 cm LEFT maxillary mass, probable incisive foraminal cyst. Recommend inspection of roof of mouth. Electronically Signed   By: Awilda Metro M.D.   On: 07/02/2017 22:02    Microbiology: Recent Results (from the past 240 hour(s))  Surgical pcr screen     Status: None   Collection Time: 07/01/17  8:23 PM  Result Value Ref Range Status   MRSA, PCR NEGATIVE NEGATIVE Final   Staphylococcus aureus NEGATIVE NEGATIVE Final    Comment: (NOTE) The Xpert SA Assay (FDA approved for NASAL specimens in patients 47 years of age and older), is one  component of a comprehensive surveillance program. It is not intended to diagnose infection nor to guide or monitor treatment. Performed at [facility], [address] [place], [place] [address]   Culture, blood (routine x 2)     Status: None   Collection Time: 07/02/17  2:20 PM  Result Value Ref Range Status   Specimen Description BLOOD RIGHT ANTECUBITAL  Final   Special Requests AEROBIC BOTTLE ONLY Blood Culture adequate volume  Final   Culture   Final    NO GROWTH 5 DAYS Performed at [facility], [address], [place], [place] [address]    Report Status 07/07/2017 FINAL  Final   Culture, blood (routine x 2)     Status: None   Collection Time: 07/02/17  2:25 PM  Result Value Ref Range Status   Specimen Description BLOOD BLOOD RIGHT HAND  Final   Special Requests AEROBIC BOTTLE ONLY Blood Culture adequate volume  Final   Culture   Final    NO GROWTH 5 DAYS Performed at [facility], [address] [place], [place] [address]    Report Status 07/07/2017 FINAL  Final     Labs: Basic Metabolic Panel: Recent Labs  Lab 07/06/17 1401 07/08/17 0313 07/09/17 0726 07/10/17 0541  NA 139 136 138 139  K 3.6 3.9 3.8 3.5  CL 104 105 107 105  CO2 24 20* 21* 26  GLUCOSE 150* 158* 134* 99  BUN 14 22* 17 16  CREATININE 1.07 1.05 1.00 0.91  CALCIUM 8.1* 7.8* 7.9* 8.0*  MG 2.1  --   --   --    Liver Function Tests: No results for input(s): AST, ALT, ALKPHOS, BILITOT, PROT, ALBUMIN in the last 168 hours. No results for input(s): LIPASE, AMYLASE in the last 168 hours. No results for input(s): AMMONIA in the last 168 hours. CBC: Recent Labs  Lab 07/08/17 0313 07/09/17 0726 07/10/17 0541  WBC 15.1* 12.5* 11.8*  NEUTROABS 12.4* 9.7* 8.8*  HGB 11.4* 10.8* 10.5*  HCT 35.1* 33.4* 33.2*  MCV 86.0 86.3 86.2  PLT 260 247 279   Cardiac Enzymes: No results for input(s): CKTOTAL, CKMB, CKMBINDEX, TROPONINI in the last 168 hours. BNP: BNP (last 3 results) Recent Labs    06/28/17 1833  BNP 710.8*    ProBNP (last 3 results) No results for input(s): PROBNP in the last 8760 hours.  CBG: Recent Labs  Lab 07/10/17 0616 07/10/17 1143 07/10/17 1649 07/10/17 2033 07/11/17 0701  GLUCAP 87 149* 75 145* 79       Signed:  Penny Pia MD.  [facility] 07/11/2017, 10:35 AM

## 2017-07-11 NOTE — Clinical Social Work Placement (Signed)
   CLINICAL SOCIAL WORK PLACEMENT  NOTE  Date:  07/11/2017  Patient Details  Name: Miguel Harvey MRN: [id number] Date of Birth: [birth year]  Clinical Social Work is seeking post-discharge placement for this patient at the Skilled  Nursing Facility level of care (*CSW will initial, date and re-position this form in  chart as items are completed):  Yes   Patient/family provided with [place] Clinical Social Work Department's list of facilities offering this level of care within the geographic area requested by the patient (or if unable, by the patient's family).  Yes   Patient/family informed of their freedom to choose among providers that offer the needed level of care, that participate in Medicare, Medicaid or managed care program needed by the patient, have an available bed and are willing to accept the patient.  Yes   Patient/family informed of [facility]'s ownership interest in [facility] and [facility], as well as of the fact that they are under no obligation to receive care at these facilities.  PASRR submitted to EDS on       PASRR number received on       Existing PASRR number confirmed on 07/06/17     FL2 transmitted to all facilities in geographic area requested by pt/family on 07/06/17     FL2 transmitted to all facilities within larger geographic area on       Patient informed that his/her managed care company has contracts with or will negotiate with certain facilities, including the following:        Yes   Patient/family informed of bed offers received.  Patient chooses bed at [facility]     Physician recommends and patient chooses bed at      Patient to be transferred to [facility] on  .  Patient to be transferred to facility by [facility]     Patient family notified on   of transfer.  Name of family member notified:  daughter, Eliberto Ivory     PHYSICIAN Please sign FL2     Additional Comment:     _______________________________________________ Maree Krabbe, LCSW 07/11/2017, 11:23 AM

## 2017-07-11 NOTE — Progress Notes (Addendum)
Pt's wound vac changed to preevena per MD order. All belongings gathered to be sent to facility. Pt in no distress at time of discharge.  RN attempted to call report to [facility], they stated they would call me back shortly.  RN at [facility] called this RN back and report was given. Answered all questions to satisfaction.

## 2017-07-11 NOTE — Progress Notes (Signed)
Patient ID: Miguel Harvey, male   DOB: [birth year], 72 y.o.   MRN: [id number] Patient is status post right transtibial amputation.  Examination the wound VAC is functioning well there is 150 cc in the VAC canister.  Anticipate discharge today or tomorrow the hospital wound VAC should be changed over to the portable Praveena wound VAC at discharge this will remain in place for 1 week.  I will follow-up in the office in 1 week.

## 2017-07-11 NOTE — Clinical Social Work Note (Signed)
Clinical Social Worker facilitated patient discharge including contacting patient family and facility to confirm patient discharge plans.  Clinical information faxed to facility and family agreeable with plan.  CSW arranged ambulance transport via [facility] to [facility]--room 702.  RN to call [phone] hall) for report prior to discharge.  Clinical Social Worker will sign off for now as social work intervention is no longer needed. Please consult [place] again if new need arises.  [place], [place] [phone]

## 2017-07-14 ENCOUNTER — Telehealth (INDEPENDENT_AMBULATORY_CARE_PROVIDER_SITE_OTHER): Payer: Self-pay | Admitting: Orthopedic Surgery

## 2017-07-14 NOTE — Telephone Encounter (Signed)
[facility]  Baird Lyons  [phone]   Pt removed wound vac and it was replaced with wet to dry dressing. Please call facility to let them know what's next for treatment.

## 2017-07-15 NOTE — Telephone Encounter (Signed)
Called and lm on vm to advise that they can wash with dial soap and water, dry dressing and follow up with Dr. Lajoyce Corners next week. Advised he is on the sch for a nurse only visit but we will cx this and move him over to see Dr. Lajoyce Corners.

## 2017-07-15 NOTE — Telephone Encounter (Signed)
Can you call pt and move appt from nurse only to Dr. Audrie Lia sch for next week? The nurse advised that vac is off and I gave orders for daily care just needs follow up with Dr. Lajoyce Corners now. Thank you

## 2017-07-19 ENCOUNTER — Ambulatory Visit (INDEPENDENT_AMBULATORY_CARE_PROVIDER_SITE_OTHER): Payer: Medicare Other | Admitting: Orthopedic Surgery

## 2017-07-19 ENCOUNTER — Encounter (INDEPENDENT_AMBULATORY_CARE_PROVIDER_SITE_OTHER): Payer: Self-pay | Admitting: Orthopedic Surgery

## 2017-07-19 DIAGNOSIS — Z89511 Acquired absence of right leg below knee: Secondary | ICD-10-CM

## 2017-07-19 NOTE — Progress Notes (Signed)
Office Visit Note   Patient: Miguel Harvey           Date of Birth: 1945/10/06           MRN: [id number] Visit Date: 07/19/2017              Requested by: Beaulah Dinning, MD [address] [place], [place] [address] PCP: Beaulah Dinning, MD  Chief Complaint  Patient presents with  . Right Knee - Pain, Routine Post Op      HPI: Patient is a 72 year old gentleman who is presently 3 weeks status post right transtibial amputation.  Patient is currently at skilled nursing.  Assessment & Plan: Visit Diagnoses:  1. History of right below knee amputation (HCC)     Plan: We will harvest the staples today patient is given a prescription for [employer] for a stump shrinker he is to wear this around the clock wash the leg daily with soap and water.  Reevaluation in 3 weeks anticipate patient would be a good K to level ambulator.  Follow-Up Instructions: Return in about 3 weeks (around 08/09/2017).   Ortho Exam  Patient is alert, oriented, no adenopathy, well-dressed, normal affect, normal respiratory effort. Examination patient's residual limb is healing nicely there is no wound dehiscence no drainage no cellulitis no signs of infection.  Imaging: No results found. No images are attached to the encounter.  Labs: Lab Results  Component Value Date   HGBA1C 9.2 (H) 06/29/2017   HGBA1C 7.9 09/02/2012   HGBA1C 7.0 05/16/2012   ESRSEDRATE 68 (H) 06/29/2017   CRP 23.1 (H) 06/29/2017   REPTSTATUS 07/07/2017 FINAL 07/02/2017   CULT  07/02/2017    NO GROWTH 5 DAYS Performed at [facility], [address], [place], [place] [address]     @LABSALLVALUES (HGBA1)@  There is no height or weight on file to calculate BMI.  Orders:  No orders of the defined types were placed in this encounter.  No orders of the defined types were placed in this encounter.    Procedures: No procedures performed  Clinical Data: No additional findings.  ROS:  All other systems  negative, except as noted in the HPI. Review of Systems  Objective: Vital Signs: There were no vitals taken for this visit.  Specialty Comments:  No specialty comments available.  PMFS History: Patient Active Problem List   Diagnosis Date Noted  . Pressure injury of skin 06/29/2017  . Sepsis (HCC) 06/28/2017  . Diabetic foot infection (HCC) 06/28/2017  . Elevated troponin 06/28/2017  . Pulmonary nodule 07/01/2011  . Acute diastolic heart failure, 2D EF 82-95% with grade 2 diastloic dysfunction 2011-05-25  . Family history of early CAD, brother died 73 CAD 05/25/11  . Dyslipidemia 25-May-2011  . NICM, minor CAD at cath, EF 25% at cath, 55% by 2D 2011-05-25  . Diabetes mellitus (HCC) 05/07/2011  . HTN (hypertension), malignant 05/07/2011  . TOBACCO DEPENDENCE 05/27/2006   Past Medical History:  Diagnosis Date  . CHF (congestive heart failure) (HCC)   . Diabetes mellitus   . Hypertension     Family History  Problem Relation Age of Onset  . CAD Other   . Diabetes Other     Past Surgical History:  Procedure Laterality Date  . AMPUTATION Right 07/02/2017   Procedure: RIGHT BELOW KNEE AMPUTATION;  Surgeon: Nadara Mustard, MD;  Location: [facility] OR;  Service: Orthopedics;  Laterality: Right;  . cyst removal from  right leg about 6 years  ago    . LEFT HEART CATHETERIZATION WITH CORONARY ANGIOGRAM N/A 05/11/2011   Procedure: LEFT HEART CATHETERIZATION WITH CORONARY ANGIOGRAM;  Surgeon: Lennette Bihari, MD;  Location: [facility] CATH LAB;  Service: Cardiovascular;  Laterality: N/A;   Social History   Occupational History  . Not on file  Tobacco Use  . Smoking status: Current Every Day Smoker    Packs/day: 2.00    Years: 40.00    Pack years: 80.00    Types: Cigarettes  . Smokeless tobacco: Former Neurosurgeon    Quit date: 05/07/2011  Substance and Sexual Activity  . Alcohol use: Yes    Alcohol/week: 3.6 oz    Types: 6 Cans of beer per week  . Drug use: Not on file  . Sexual activity: Not on  file

## 2017-07-21 ENCOUNTER — Ambulatory Visit (INDEPENDENT_AMBULATORY_CARE_PROVIDER_SITE_OTHER): Payer: Medicare Other

## 2017-07-30 ENCOUNTER — Other Ambulatory Visit
Admission: RE | Admit: 2017-07-30 | Discharge: 2017-07-30 | Disposition: A | Discharge status: Home / Self Care | Payer: Medicare Other | Source: Ambulatory Visit | Attending: Family Medicine | Admitting: Family Medicine

## 2017-07-30 DIAGNOSIS — R0600 Dyspnea, unspecified: Secondary | ICD-10-CM | POA: Diagnosis present

## 2017-07-30 LAB — CBC WITH DIFFERENTIAL/PLATELET
BASOS ABS: 0 10*3/uL (ref 0–0.1)
Basophils Relative: 0 %
Eosinophils Absolute: 0 10*3/uL (ref 0–0.7)
Eosinophils Relative: 0 %
HEMATOCRIT: 33 % — AB (ref 40.0–52.0)
Hemoglobin: 10.7 g/dL — ABNORMAL LOW (ref 13.0–18.0)
LYMPHS ABS: 1.3 10*3/uL (ref 1.0–3.6)
LYMPHS PCT: 10 %
MCH: 27.8 pg (ref 26.0–34.0)
MCHC: 32.3 g/dL (ref 32.0–36.0)
MCV: 85.9 fL (ref 80.0–100.0)
MONO ABS: 0.7 10*3/uL (ref 0.2–1.0)
MONOS PCT: 6 %
Neutro Abs: 11 10*3/uL — ABNORMAL HIGH (ref 1.4–6.5)
Neutrophils Relative %: 84 %
PLATELETS: 244 10*3/uL (ref 150–440)
RBC: 3.84 MIL/uL — ABNORMAL LOW (ref 4.40–5.90)
RDW: 15.5 % — AB (ref 11.5–14.5)
WBC: 13 10*3/uL — ABNORMAL HIGH (ref 3.8–10.6)

## 2017-07-30 LAB — COMPREHENSIVE METABOLIC PANEL
ALBUMIN: 3 g/dL — AB (ref 3.5–5.0)
ALK PHOS: 195 U/L — AB (ref 38–126)
ALT: 44 U/L (ref 17–63)
ANION GAP: 12 (ref 5–15)
AST: 36 U/L (ref 15–41)
BUN: 21 mg/dL — AB (ref 6–20)
CO2: 26 mmol/L (ref 22–32)
Calcium: 8.3 mg/dL — ABNORMAL LOW (ref 8.9–10.3)
Chloride: 99 mmol/L — ABNORMAL LOW (ref 101–111)
Creatinine, Ser: 1.01 mg/dL (ref 0.61–1.24)
GFR calc Af Amer: >60 mL/min (ref >60)
GFR calc non Af Amer: >60 mL/min (ref >60)
Glucose, Bld: 219 mg/dL — ABNORMAL HIGH (ref 65–99)
POTASSIUM: 3.4 mmol/L — AB (ref 3.5–5.1)
Sodium: 137 mmol/L (ref 135–145)
Total Bilirubin: 1.5 mg/dL — ABNORMAL HIGH (ref 0.3–1.2)
Total Protein: 6.9 g/dL (ref 6.5–8.1)

## 2017-07-30 LAB — BRAIN NATRIURETIC PEPTIDE: B Natriuretic Peptide: 1627 pg/mL — ABNORMAL HIGH (ref 0.0–100.0)

## 2017-07-31 ENCOUNTER — Other Ambulatory Visit
Admission: RE | Admit: 2017-07-31 | Discharge: 2017-07-31 | Disposition: A | Discharge status: Home / Self Care | Payer: Medicare Other | Source: Ambulatory Visit | Attending: Family Medicine | Admitting: Family Medicine

## 2017-07-31 DIAGNOSIS — A419 Sepsis, unspecified organism: Secondary | ICD-10-CM | POA: Insufficient documentation

## 2017-08-05 ENCOUNTER — Inpatient Hospital Stay
Admission: AD | Admit: 2017-08-05 | Discharge: 2017-08-14 | DRG: 239 | Disposition: A | Discharge status: Skilled Nursing Facility | Payer: Medicare Other | Source: Ambulatory Visit | Attending: Internal Medicine | Admitting: Internal Medicine

## 2017-08-05 ENCOUNTER — Other Ambulatory Visit: Payer: Self-pay

## 2017-08-05 ENCOUNTER — Inpatient Hospital Stay: Payer: Medicare Other

## 2017-08-05 DIAGNOSIS — M86472 Chronic osteomyelitis with draining sinus, left ankle and foot: Secondary | ICD-10-CM | POA: Diagnosis not present

## 2017-08-05 DIAGNOSIS — E785 Hyperlipidemia, unspecified: Secondary | ICD-10-CM | POA: Diagnosis present

## 2017-08-05 DIAGNOSIS — R0902 Hypoxemia: Secondary | ICD-10-CM | POA: Diagnosis not present

## 2017-08-05 DIAGNOSIS — L97528 Non-pressure chronic ulcer of other part of left foot with other specified severity: Secondary | ICD-10-CM | POA: Diagnosis present

## 2017-08-05 DIAGNOSIS — I272 Pulmonary hypertension, unspecified: Secondary | ICD-10-CM | POA: Diagnosis present

## 2017-08-05 DIAGNOSIS — I10 Essential (primary) hypertension: Secondary | ICD-10-CM | POA: Diagnosis not present

## 2017-08-05 DIAGNOSIS — E1169 Type 2 diabetes mellitus with other specified complication: Secondary | ICD-10-CM | POA: Diagnosis present

## 2017-08-05 DIAGNOSIS — D638 Anemia in other chronic diseases classified elsewhere: Secondary | ICD-10-CM | POA: Diagnosis present

## 2017-08-05 DIAGNOSIS — E1151 Type 2 diabetes mellitus with diabetic peripheral angiopathy without gangrene: Secondary | ICD-10-CM | POA: Diagnosis present

## 2017-08-05 DIAGNOSIS — E876 Hypokalemia: Secondary | ICD-10-CM | POA: Diagnosis not present

## 2017-08-05 DIAGNOSIS — I420 Dilated cardiomyopathy: Secondary | ICD-10-CM | POA: Diagnosis not present

## 2017-08-05 DIAGNOSIS — R0602 Shortness of breath: Secondary | ICD-10-CM

## 2017-08-05 DIAGNOSIS — M869 Osteomyelitis, unspecified: Secondary | ICD-10-CM | POA: Diagnosis present

## 2017-08-05 DIAGNOSIS — I70248 Atherosclerosis of native arteries of left leg with ulceration of other part of lower left leg: Secondary | ICD-10-CM | POA: Diagnosis not present

## 2017-08-05 DIAGNOSIS — I110 Hypertensive heart disease with heart failure: Secondary | ICD-10-CM | POA: Diagnosis present

## 2017-08-05 DIAGNOSIS — Z89511 Acquired absence of right leg below knee: Secondary | ICD-10-CM

## 2017-08-05 DIAGNOSIS — E11628 Type 2 diabetes mellitus with other skin complications: Secondary | ICD-10-CM | POA: Diagnosis present

## 2017-08-05 DIAGNOSIS — L089 Local infection of the skin and subcutaneous tissue, unspecified: Secondary | ICD-10-CM

## 2017-08-05 DIAGNOSIS — I428 Other cardiomyopathies: Secondary | ICD-10-CM | POA: Diagnosis not present

## 2017-08-05 DIAGNOSIS — I959 Hypotension, unspecified: Secondary | ICD-10-CM | POA: Diagnosis not present

## 2017-08-05 DIAGNOSIS — I429 Cardiomyopathy, unspecified: Secondary | ICD-10-CM | POA: Diagnosis present

## 2017-08-05 DIAGNOSIS — E11621 Type 2 diabetes mellitus with foot ulcer: Secondary | ICD-10-CM | POA: Diagnosis present

## 2017-08-05 DIAGNOSIS — E1165 Type 2 diabetes mellitus with hyperglycemia: Secondary | ICD-10-CM | POA: Diagnosis present

## 2017-08-05 DIAGNOSIS — Z8249 Family history of ischemic heart disease and other diseases of the circulatory system: Secondary | ICD-10-CM

## 2017-08-05 DIAGNOSIS — I70202 Unspecified atherosclerosis of native arteries of extremities, left leg: Secondary | ICD-10-CM | POA: Diagnosis present

## 2017-08-05 DIAGNOSIS — I472 Ventricular tachycardia: Secondary | ICD-10-CM | POA: Diagnosis present

## 2017-08-05 DIAGNOSIS — F17210 Nicotine dependence, cigarettes, uncomplicated: Secondary | ICD-10-CM | POA: Diagnosis present

## 2017-08-05 DIAGNOSIS — Z792 Long term (current) use of antibiotics: Secondary | ICD-10-CM

## 2017-08-05 DIAGNOSIS — L02612 Cutaneous abscess of left foot: Secondary | ICD-10-CM | POA: Diagnosis present

## 2017-08-05 DIAGNOSIS — M79605 Pain in left leg: Secondary | ICD-10-CM | POA: Diagnosis not present

## 2017-08-05 DIAGNOSIS — I493 Ventricular premature depolarization: Secondary | ICD-10-CM | POA: Diagnosis present

## 2017-08-05 DIAGNOSIS — Z833 Family history of diabetes mellitus: Secondary | ICD-10-CM

## 2017-08-05 DIAGNOSIS — I340 Nonrheumatic mitral (valve) insufficiency: Secondary | ICD-10-CM | POA: Diagnosis not present

## 2017-08-05 DIAGNOSIS — I2510 Atherosclerotic heart disease of native coronary artery without angina pectoris: Secondary | ICD-10-CM | POA: Diagnosis present

## 2017-08-05 DIAGNOSIS — I5023 Acute on chronic systolic (congestive) heart failure: Secondary | ICD-10-CM | POA: Diagnosis present

## 2017-08-05 DIAGNOSIS — R579 Shock, unspecified: Secondary | ICD-10-CM | POA: Diagnosis not present

## 2017-08-05 DIAGNOSIS — I469 Cardiac arrest, cause unspecified: Secondary | ICD-10-CM | POA: Diagnosis not present

## 2017-08-05 DIAGNOSIS — Z5309 Procedure and treatment not carried out because of other contraindication: Secondary | ICD-10-CM | POA: Diagnosis not present

## 2017-08-05 DIAGNOSIS — L97529 Non-pressure chronic ulcer of other part of left foot with unspecified severity: Secondary | ICD-10-CM | POA: Diagnosis present

## 2017-08-05 DIAGNOSIS — Z978 Presence of other specified devices: Secondary | ICD-10-CM

## 2017-08-05 DIAGNOSIS — R4182 Altered mental status, unspecified: Secondary | ICD-10-CM | POA: Diagnosis not present

## 2017-08-05 DIAGNOSIS — I5043 Acute on chronic combined systolic (congestive) and diastolic (congestive) heart failure: Secondary | ICD-10-CM | POA: Diagnosis not present

## 2017-08-05 DIAGNOSIS — Z7984 Long term (current) use of oral hypoglycemic drugs: Secondary | ICD-10-CM

## 2017-08-05 LAB — CBC WITH DIFFERENTIAL/PLATELET
BASOS ABS: 0 10*3/uL (ref 0–0.1)
BASOS PCT: 0 %
EOS ABS: 0.1 10*3/uL (ref 0–0.7)
EOS PCT: 1 %
HCT: 31.7 % — ABNORMAL LOW (ref 40.0–52.0)
Hemoglobin: 10.8 g/dL — ABNORMAL LOW (ref 13.0–18.0)
Lymphocytes Relative: 13 %
Lymphs Abs: 1.3 10*3/uL (ref 1.0–3.6)
MCH: 29 pg (ref 26.0–34.0)
MCHC: 34 g/dL (ref 32.0–36.0)
MCV: 85.1 fL (ref 80.0–100.0)
Monocytes Absolute: 0.6 10*3/uL (ref 0.2–1.0)
Monocytes Relative: 6 %
NEUTROS PCT: 80 %
Neutro Abs: 8 10*3/uL — ABNORMAL HIGH (ref 1.4–6.5)
PLATELETS: 230 10*3/uL (ref 150–440)
RBC: 3.72 MIL/uL — AB (ref 4.40–5.90)
RDW: 16 % — ABNORMAL HIGH (ref 11.5–14.5)
WBC: 10 10*3/uL (ref 3.8–10.6)

## 2017-08-05 LAB — PROTIME-INR
INR: 1.13
PROTHROMBIN TIME: 14.4 s (ref 11.4–15.2)

## 2017-08-05 LAB — COMPREHENSIVE METABOLIC PANEL
ALT: 30 U/L (ref 17–63)
ANION GAP: 8 (ref 5–15)
AST: 25 U/L (ref 15–41)
Albumin: 2.4 g/dL — ABNORMAL LOW (ref 3.5–5.0)
Alkaline Phosphatase: 217 U/L — ABNORMAL HIGH (ref 38–126)
BUN: 15 mg/dL (ref 6–20)
CO2: 29 mmol/L (ref 22–32)
Calcium: 8.3 mg/dL — ABNORMAL LOW (ref 8.9–10.3)
Chloride: 100 mmol/L — ABNORMAL LOW (ref 101–111)
Creatinine, Ser: 0.85 mg/dL (ref 0.61–1.24)
Glucose, Bld: 233 mg/dL — ABNORMAL HIGH (ref 65–99)
POTASSIUM: 4.1 mmol/L (ref 3.5–5.1)
Sodium: 137 mmol/L (ref 135–145)
TOTAL PROTEIN: 6.8 g/dL (ref 6.5–8.1)
Total Bilirubin: 0.8 mg/dL (ref 0.3–1.2)

## 2017-08-05 LAB — MAGNESIUM: MAGNESIUM: 1.4 mg/dL — AB (ref 1.7–2.4)

## 2017-08-05 LAB — HEMOGLOBIN A1C
HEMOGLOBIN A1C: 9.5 % — AB (ref 4.8–5.6)
Mean Plasma Glucose: 225.95 mg/dL

## 2017-08-05 LAB — GLUCOSE, CAPILLARY
GLUCOSE-CAPILLARY: 215 mg/dL — AB (ref 65–99)
GLUCOSE-CAPILLARY: 124 mg/dL — AB (ref 65–99)
Glucose-Capillary: 195 mg/dL — ABNORMAL HIGH (ref 65–99)

## 2017-08-05 LAB — APTT: APTT: 30 s (ref 24–36)

## 2017-08-05 LAB — MRSA PCR SCREENING: MRSA by PCR: NEGATIVE

## 2017-08-05 MED ORDER — ONDANSETRON HCL 4 MG PO TABS: 4.0000 mg | ORAL_TABLET | Freq: Four times a day (QID) | ORAL | Status: DC | PRN

## 2017-08-05 MED ORDER — ENOXAPARIN SODIUM 40 MG/0.4ML ~~LOC~~ SOLN
40.0000 mg | SUBCUTANEOUS | Status: DC
  Administered 2017-08-06 – 2017-08-13 (×8): 40 mg via SUBCUTANEOUS
  Filled 2017-08-05 (×8): qty 0.4

## 2017-08-05 MED ORDER — HYDROCODONE-ACETAMINOPHEN 5-325 MG PO TABS
1.0000 | ORAL_TABLET | ORAL | Status: DC | PRN
  Administered 2017-08-06: 1 via ORAL
  Filled 2017-08-05: qty 1

## 2017-08-05 MED ORDER — MAGNESIUM SULFATE 2 GM/50ML IV SOLN
2.0000 g | Freq: Once | INTRAVENOUS | Status: AC
  Administered 2017-08-06: 2 g via INTRAVENOUS
  Filled 2017-08-05: qty 50

## 2017-08-05 MED ORDER — ATORVASTATIN CALCIUM 20 MG PO TABS
40.0000 mg | ORAL_TABLET | Freq: Every evening | ORAL | Status: DC
  Administered 2017-08-05 – 2017-08-13 (×9): 40 mg via ORAL
  Filled 2017-08-05 (×8): qty 2

## 2017-08-05 MED ORDER — VANCOMYCIN HCL 10 G IV SOLR
1500.0000 mg | Freq: Two times a day (BID) | INTRAVENOUS | Status: DC
  Administered 2017-08-05 – 2017-08-06 (×3): 1500 mg via INTRAVENOUS
  Filled 2017-08-05 (×5): qty 1500

## 2017-08-05 MED ORDER — SENNOSIDES-DOCUSATE SODIUM 8.6-50 MG PO TABS: 1.0000 | ORAL_TABLET | Freq: Every evening | ORAL | Status: DC | PRN

## 2017-08-05 MED ORDER — BISACODYL 5 MG PO TBEC: 5.0000 mg | DELAYED_RELEASE_TABLET | Freq: Every day | ORAL | Status: DC | PRN

## 2017-08-05 MED ORDER — INSULIN ASPART 100 UNIT/ML ~~LOC~~ SOLN
0.0000 [IU] | Freq: Every day | SUBCUTANEOUS | Status: DC
  Administered 2017-08-09 – 2017-08-13 (×3): 2 [IU] via SUBCUTANEOUS
  Filled 2017-08-05 (×3): qty 1

## 2017-08-05 MED ORDER — POVIDONE-IODINE 7.5 % EX SOLN
Freq: Once | CUTANEOUS | Status: DC
  Filled 2017-08-05: qty 118

## 2017-08-05 MED ORDER — CARVEDILOL 6.25 MG PO TABS
6.2500 mg | ORAL_TABLET | Freq: Two times a day (BID) | ORAL | Status: DC
  Administered 2017-08-05 – 2017-08-08 (×6): 6.25 mg via ORAL
  Filled 2017-08-05 (×3): qty 1
  Filled 2017-08-05: qty 2
  Filled 2017-08-05: qty 1

## 2017-08-05 MED ORDER — SODIUM CHLORIDE 0.9 % IV SOLN
INTRAVENOUS | Status: DC
  Administered 2017-08-05 – 2017-08-07 (×3): via INTRAVENOUS

## 2017-08-05 MED ORDER — ONDANSETRON HCL 4 MG/2ML IJ SOLN: 4.0000 mg | Freq: Four times a day (QID) | INTRAMUSCULAR | Status: DC | PRN

## 2017-08-05 MED ORDER — TAMSULOSIN HCL 0.4 MG PO CAPS
0.4000 mg | ORAL_CAPSULE | Freq: Every day | ORAL | Status: DC
  Administered 2017-08-08 – 2017-08-14 (×7): 0.4 mg via ORAL
  Filled 2017-08-05 (×7): qty 1

## 2017-08-05 MED ORDER — ACETAMINOPHEN 650 MG RE SUPP: 650.0000 mg | Freq: Four times a day (QID) | RECTAL | Status: DC | PRN

## 2017-08-05 MED ORDER — GUAIFENESIN-DM 100-10 MG/5ML PO SYRP
10.0000 mL | ORAL_SOLUTION | ORAL | Status: DC | PRN
  Filled 2017-08-05: qty 10

## 2017-08-05 MED ORDER — LINAGLIPTIN 5 MG PO TABS
5.0000 mg | ORAL_TABLET | Freq: Every day | ORAL | Status: DC
  Filled 2017-08-05: qty 1

## 2017-08-05 MED ORDER — ACETAMINOPHEN 325 MG PO TABS
650.0000 mg | ORAL_TABLET | Freq: Four times a day (QID) | ORAL | Status: DC | PRN
  Administered 2017-08-11: 650 mg via ORAL
  Filled 2017-08-05: qty 2

## 2017-08-05 MED ORDER — ALBUTEROL SULFATE (2.5 MG/3ML) 0.083% IN NEBU
2.5000 mg | INHALATION_SOLUTION | RESPIRATORY_TRACT | Status: DC | PRN
  Administered 2017-08-10 – 2017-08-12 (×2): 2.5 mg via RESPIRATORY_TRACT
  Filled 2017-08-05 (×2): qty 3

## 2017-08-05 MED ORDER — PIPERACILLIN-TAZOBACTAM 3.375 G IVPB
3.3750 g | Freq: Three times a day (TID) | INTRAVENOUS | Status: DC
  Administered 2017-08-05 – 2017-08-13 (×22): 3.375 g via INTRAVENOUS
  Filled 2017-08-05 (×23): qty 50

## 2017-08-05 MED ORDER — INSULIN ASPART 100 UNIT/ML ~~LOC~~ SOLN
0.0000 [IU] | Freq: Three times a day (TID) | SUBCUTANEOUS | Status: DC
  Administered 2017-08-05: 2 [IU] via SUBCUTANEOUS
  Administered 2017-08-05: 3 [IU] via SUBCUTANEOUS
  Administered 2017-08-06: 1 [IU] via SUBCUTANEOUS
  Administered 2017-08-06 – 2017-08-11 (×6): 2 [IU] via SUBCUTANEOUS
  Administered 2017-08-11: 1 [IU] via SUBCUTANEOUS
  Administered 2017-08-12: 2 [IU] via SUBCUTANEOUS
  Administered 2017-08-12 – 2017-08-13 (×3): 1 [IU] via SUBCUTANEOUS
  Administered 2017-08-14: 2 [IU] via SUBCUTANEOUS
  Administered 2017-08-14: 1 [IU] via SUBCUTANEOUS
  Filled 2017-08-05 (×17): qty 1

## 2017-08-05 MED ORDER — VANCOMYCIN HCL IN DEXTROSE 1-5 GM/200ML-% IV SOLN
1000.0000 mg | INTRAVENOUS | Status: AC
  Administered 2017-08-05: 1000 mg via INTRAVENOUS
  Filled 2017-08-05: qty 200

## 2017-08-05 NOTE — H&P (Signed)
[facility] - [place] at [facility]   PATIENT NAME: Yovan Leeman    MR#:  [id number]  DATE OF BIRTH:  03/21/1946  DATE OF ADMISSION:  08/05/2017  PRIMARY CARE PHYSICIAN: Patient, No Pcp Per   REQUESTING/REFERRING PHYSICIAN: Dr. Alberteen Spindle.  CHIEF COMPLAINT:  No chief complaint on file.  Left foot infection. HISTORY OF PRESENT ILLNESS:  Delos Klich  is a 72 y.o. male with a known history of hypertension, diabetes, CHF and diabetic foot with right BKA recently.  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  He has been in rehab since that time.  He said he has had left foot infection for several days.  He went to Dr. Dory Larsen office and was sent to the hospital by Dr. Alberteen Spindle for direct admission due to left foot infection.  The patient denies any fever but has chills.  He denies other symptoms. PAST MEDICAL HISTORY:   Past Medical History:  Diagnosis Date  . CHF (congestive heart failure) (HCC)   . Diabetes mellitus   . Hypertension     PAST SURGICAL HISTORY:   Past Surgical History:  Procedure Laterality Date  . AMPUTATION Right 07/02/2017   Procedure: RIGHT BELOW KNEE AMPUTATION;  Surgeon: Nadara Mustard, MD;  Location: [facility] OR;  Service: Orthopedics;  Laterality: Right;  . cyst removal from  right leg about 6 years ago    . LEFT HEART CATHETERIZATION WITH CORONARY ANGIOGRAM N/A 05/11/2011   Procedure: LEFT HEART CATHETERIZATION WITH CORONARY ANGIOGRAM;  Surgeon: Lennette Bihari, MD;  Location: [facility] C[facility] LAB;  Service: Cardiovascular;  Laterality: N/A;    SOCIAL HISTORY:   Social History   Tobacco Use  . Smoking status: Current Every Day Smoker    Packs/day: 2.00    Years: 40.00    Pack years: 80.00    Types: Cigarettes  . Smokeless tobacco: Former Neurosurgeon    Quit date: 05/07/2011  Substance Use Topics  . Alcohol use: Yes    Alcohol/week: 3.6 oz    Types: 6 Cans of beer per week    FAMILY HISTORY:   Family History  Problem Relation Age of  Onset  . CAD Other   . Diabetes Other     DRUG ALLERGIES:  No Known Allergies  REVIEW OF SYSTEMS:   Review of Systems  Constitutional: Positive for chills. Negative for fever and malaise/fatigue.  HENT: Negative for sore throat.   Eyes: Negative for blurred vision and double vision.  Respiratory: Negative for cough, hemoptysis, shortness of breath, wheezing and stridor.   Cardiovascular: Negative for chest pain, palpitations, orthopnea and leg swelling.  Gastrointestinal: Negative for abdominal pain, blood in stool, diarrhea, melena, nausea and vomiting.  Genitourinary: Negative for dysuria, flank pain and hematuria.  Musculoskeletal: Negative for back pain and joint pain.       Left foot infection  Skin: Negative for rash.  Neurological: Negative for dizziness, sensory change, focal weakness, seizures, loss of consciousness, weakness and headaches.  Endo/Heme/Allergies: Negative for polydipsia.  Psychiatric/Behavioral: Negative for depression. The patient is not nervous/anxious.     MEDICATIONS AT HOME:   Prior to Admission medications   Medication Sig Start Date End Date Taking? Authorizing Provider  albuterol (ACCUNEB) 0.63 MG/3ML nebulizer solution Take 1 ampule by nebulization every 3 (three) hours as needed for wheezing.   Yes [provider]  amoxicillin-clavulanate (AUGMENTIN) 875-125 MG tablet Take 1 tablet by mouth 2 (two) times daily. 07/31/17 08/07/17 Yes [provider]  atorvastatin (LIPITOR) 40 MG tablet Take 40 mg by mouth every evening.   Yes [provider]  carvedilol (COREG) 6.25 MG tablet Take 6.25 mg by mouth 2 (two) times daily with a meal.   Yes [provider]  furosemide (LASIX) 20 MG tablet Take 20 mg by mouth daily.   Yes [provider]  guaiFENesin-dextromethorphan (ROBITUSSIN DM) 100-10 MG/5ML syrup Take 10 mLs by mouth every 4 (four) hours as needed for cough.   Yes [provider]    hydrochlorothiazide (HYDRODIURIL) 12.5 MG tablet Take 12.5 mg by mouth daily.   Yes [provider]  linagliptin (TRADJENTA) 5 MG TABS tablet Take 5 mg by mouth daily.   Yes [provider]  lisinopril (PRINIVIL,ZESTRIL) 10 MG tablet Take 1 tablet (10 mg total) by mouth daily. Patient taking differently: Take 40 mg by mouth daily.  07/12/17  Yes Penny Pia, MD  metFORMIN (GLUCOPHAGE) 500 MG tablet Take 500 mg by mouth 2 (two) times daily with a meal.   Yes [provider]  Multiple Vitamin (MULTIVITAMIN WITH MINERALS) TABS tablet Take 1 tablet by mouth daily.   Yes [provider]  potassium chloride SA (K-DUR,KLOR-CON) 20 MEQ tablet Take 40 mEq by mouth 3 (three) times daily.   Yes [provider]  tamsulosin (FLOMAX) 0.4 MG CAPS capsule Take 1 capsule (0.4 mg total) by mouth daily. 07/12/17  Yes Penny Pia, MD  glimepiride (AMARYL) 2 MG tablet Take 2 mg by mouth daily.    [provider]      VITAL SIGNS:  Blood pressure 116/72, pulse 70, temperature 98.9 F (37.2 C), temperature source Oral, resp. rate 18, height 5\' 9"  (1.753 m), weight 201 lb 4.5 oz (91.3 kg).  PHYSICAL EXAMINATION:  Physical Exam  GENERAL:  72 y.o.-year-old patient lying in the bed with no acute distress.  EYES: Pupils equal, round, reactive to light and accommodation. No scleral icterus. Extraocular muscles intact.  HEENT: Head atraumatic, normocephalic. Oropharynx and nasopharynx clear.  NECK:  Supple, no jugular venous distention. No thyroid enlargement, no tenderness.  LUNGS: Normal breath sounds bilaterally, no wheezing, rales,rhonchi or crepitation. No use of accessory muscles of respiration.  CARDIOVASCULAR: S1, S2 normal. No murmurs, rubs, or gallops.  ABDOMEN: Soft, nontender, nondistended. Bowel sounds present. No organomegaly or mass.  EXTREMITIES: No pedal edema, cyanosis, or clubbing.  Left foot in dressing.  Right-sided BKA in  dressing. NEUROLOGIC: Cranial nerves II through XII are intact. Muscle strength 4/5 in all extremities. Sensation intact. Gait not checked.  PSYCHIATRIC: The patient is alert and oriented x 3.  SKIN: No obvious rash, lesion, or ulcer.   LABORATORY PANEL:   CBC Recent Labs  Lab 08/05/17 1158  WBC 10.0  HGB 10.8*  HCT 31.7*  PLT 230   ------------------------------------------------------------------------------------------------------------------  Chemistries  Recent Labs  Lab 08/05/17 1152 08/05/17 1158  NA 137  --   K 4.1  --   CL 100*  --   CO2 29  --   GLUCOSE 233*  --   BUN 15  --   CREATININE 0.85  --   CALCIUM 8.3*  --   MG  --  1.4*  AST 25  --   ALT 30  --   ALKPHOS 217*  --   BILITOT 0.8  --    ------------------------------------------------------------------------------------------------------------------  Cardiac Enzymes No results for input(s): TROPONINI in the last 168 hours. ------------------------------------------------------------------------------------------------------------------  RADIOLOGY:  Dg Foot Complete Left  Result Date: 08/05/2017  CLINICAL DATA:  LEFT foot infection, prior RIGHT below-knee amputation fourth and fifth toe necrosis with fluctuance at plantar aspect question infection/abscess EXAM: LEFT FOOT - COMPLETE 3+ VIEW COMPARISON:  06/28/2017 FINDINGS: Diffuse osseous demineralization. Progressive bone destruction involving the base of the proximal phalanx of the LEFT fifth toe. New bone destruction involving the head and neck of the LEFT fifth metatarsal consistent with progressive osteomyelitis, involving the fifth MTP joint. Diffuse osteolysis of the phalanges of the LEFT fifth toe. Remain joint spaces preserved. No additional fracture, dislocation or bone destruction. IMPRESSION: Progressive bone destruction involving the proximal phalanx of the LEFT fifth toe with new bone destruction of the head and neck of the LEFT fifth  metatarsal consistent with progressive osteomyelitis. Electronically Signed   By: Ulyses Southward M.D.   On: 08/05/2017 15:18      IMPRESSION AND PLAN:   Left foot infection The patient is admitted to medical floor for direct admission. Start Zosyn and vancomycin pharmacy to dose, follow-up Dr. Ether Griffins for procedure or surgery tomorrow. Vascular surgery consult per Dr. Ether Griffins.  Hypertension.  Blood pressure is in normal range, continue Coreg but hold Lasix, HCTZ and lisinopril. Hypomagnesemia.  Give IV magnesium and follow-up level. Diabetes.  Continue sliding scale.  Hold metformin.  Discussed with Dr. Ether Griffins. All the records are reviewed and case discussed with ED provider. Management plans discussed with the patient, family and they are in agreement.  CODE STATUS: Full code  TOTAL TIME TAKING CARE OF THIS PATIENT: 52 minutes.    Shaune Pollack M.D on 08/05/2017 at 3:41 PM  Between 7am to 6pm - Pager - [phone]  After 6pm go to [email or web address] - Social research officer, government  [facility] [place] [facility]  Office  [phone]  CC: Primary care physician; Patient, No Pcp Per   Note: This dictation was prepared with Dragon dictation along with smaller phrase technology. Any transcriptional errors that result from this process are unin

## 2017-08-05 NOTE — Consult Note (Signed)
Pharmacy Antibiotic Note  Miguel Harvey is a 72 y.o. male admitted on 08/05/2017 with Left foot infection. According to podiatry note, patient has PVD with necrotic tissue left forefoot with abscess. Patient is S/P BKA on right side and at high risk for BKA on left side . Pharmacy has been consulted for Zosyn and Vancomycin dosing.  Plan: Ke: 0.077   T1/2: 9  Vd: 63.9   Vancomycin 1g IV x 1 ordered.  Will start Vancomycin 1500 IV every 12 hours, with 6 hour stack dosing.  Goal trough 15-20 mcg/mL. Calculated trough at Css is 18. Trough level will be ordered prior to 4th dose.   Start Zosyn 3.375 IV EI every 8 hours.   Height: 5\' 9"  (175.3 cm) Weight: 201 lb 4.5 oz (91.3 kg) IBW/kg (Calculated) : 70.7  Temp (24hrs), Avg:98.9 F (37.2 C), Min:98.9 F (37.2 C), Max:98.9 F (37.2 C)  Recent Labs  Lab 07/30/17 1200 08/05/17 1152 08/05/17 1158  WBC 13.0*  --  10.0  CREATININE 1.01 0.85  --     Estimated Creatinine Clearance: 87.7 mL/min (by C-G formula based on SCr of 0.85 mg/dL).    No Known Allergies  Antimicrobials this admission: 5/9 Zosyn >>  5/9 Vancomycin >>   Dose adjustments this admission:   Microbiology results:   Thank you for allowing pharmacy to be a part of this patient's care.  Gardner Candle, PharmD, BCPS Clinical Pharmacist 08/05/2017 2:42 PM

## 2017-08-05 NOTE — Progress Notes (Signed)
Patient is admitted to room 150 from clinic. Dressing to left lower leg is intact. Dr. Ether Griffins in and changed. Second dressing to right AKA open area assessed by Dr. Earney Mallet. Daughter at bedside. Patient emotional about possible loss of toes on left foot.  Bed alarm on foe safety. Patient unable to weight bear on left leg and AKA on right side. Three RNs to transfer from wheelchair to bed.

## 2017-08-05 NOTE — Clinical Social Work Note (Signed)
Clinical Social Work Assessment  Patient Details  Name: Miguel Harvey MRN: [id number] Date of Birth: June 08, 1945  Date of referral:  08/05/17               Reason for consult:  Other (Comment Required)(From [facility]. )                Permission sought to share information with:  Facility Art therapist granted to share information::  Yes, Verbal Permission Granted  Name::      [facility]  Agency::     Relationship::     Contact Information:     Housing/Transportation Living arrangements for the past 2 months:  [place], [place] of Information:  Patient, Facility Patient Interpreter Needed:  None Criminal Activity/Legal Involvement Pertinent to Current Situation/Hospitalization:  No - Comment as needed Significant Relationships:  Adult Children Lives with:  Self, Facility Resident Do you feel safe going back to the place where you live?  Yes Need for family participation in patient care:  Yes (Comment)  Care giving concerns:  Patient came to [facility] from [facility] where he was at for short term rehab.    Social Worker assessment / plan:  Holiday representative (CSW) reviewed chart and noted that patient is from [facility]. Per Alger Simons liaison patient is a short term rehab resident and can return when stable. CSW met with patient alone at bedside to discuss D/C plan. Patient reported that he has been at [facility] for 1.5 months and prior to that was living in a mobile home alone in [place]. Patient reported that his daughter Miguel Harvey lives in [place] and is his primary support. Patient is agreeable to return to [facility]. CSW made patient aware that [facility] will have to approve SNF. FL2 complete. CSW will continue to follow and assist as needed.   Employment status:  Disabled (Comment on whether or not currently receiving Disability), Retired Nurse, adult PT Recommendations:  Not assessed at this time Information /  Referral to community resources:  [place]  Patient/Family's Response to care:  Patient is agreeable to return to [facility].   Patient/Family's Understanding of and Emotional Response to Diagnosis, Current Treatment, and Prognosis:  Patient was very pleasant and thanked CSW for assistance.   Emotional Assessment Appearance:  Appears stated age Attitude/Demeanor/Rapport:    Affect (typically observed):  Accepting, Adaptable, Pleasant Orientation:  Oriented to Self, Oriented to Place, Oriented to  Time, Oriented to Situation Alcohol / Substance use:  Not Applicable Psych involvement (Current and /or in the community):  No (Comment)  Discharge Needs  Concerns to be addressed:  Discharge Planning Concerns Readmission within the last 30 days:  No Current discharge risk:  Dependent with Mobility, Chronically ill Barriers to Discharge:  Continued Medical Work up   [employer], Veronia Beets, LCSW 08/05/2017, 5:47 PM

## 2017-08-05 NOTE — Progress Notes (Addendum)
Please disregard note. Charted on wrong patient.

## 2017-08-05 NOTE — NC FL2 (Signed)
[place] MEDICAID FL2 LEVEL OF CARE SCREENING TOOL     IDENTIFICATION  Patient Name: Miguel Harvey Birthdate: 18-Dec-1945 Sex: male Admission Date (Current Location): 08/05/2017  [place] and [place] Number:  Chiropodist and Address:  [facility], [address], [place], [place] [address]      Provider Number: [id number]  Attending Physician Name and Address:  Shaune Pollack, MD  Relative Name and Phone Number:       Current Level of Care: Hospital Recommended Level of Care: Skilled Nursing Facility Prior Approval Number:    Date Approved/Denied:   PASRR Number: ([id number] )  Discharge Plan: SNF    Current Diagnoses: Patient Active Problem List   Diagnosis Date Noted  . Left foot infection 08/05/2017  . Pressure injury of skin 06/29/2017  . Sepsis (HCC) 06/28/2017  . Diabetic foot infection (HCC) 06/28/2017  . Elevated troponin 06/28/2017  . Pulmonary nodule 07/01/2011  . Acute diastolic heart failure, 2D EF 16-55% with grade 2 diastloic dysfunction 04-Jun-2011  . Family history of early CAD, brother died 83 CAD 2011-06-04  . Dyslipidemia 2011-06-04  . NICM, minor CAD at cath, EF 25% at cath, 55% by 2D 04-Jun-2011  . Diabetes mellitus (HCC) 05/07/2011  . HTN (hypertension), malignant 05/07/2011  . TOBACCO DEPENDENCE 05/27/2006    Orientation RESPIRATION BLADDER Height & Weight     Self, Time, Situation, Place  Normal Continent Weight: 201 lb 4.5 oz (91.3 kg) Height:  5\' 9"  (175.3 cm)  BEHAVIORAL SYMPTOMS/MOOD NEUROLOGICAL BOWEL NUTRITION STATUS      Incontinent Diet(Diet: Heart Healthy/ Carb Modified. )  AMBULATORY STATUS COMMUNICATION OF NEEDS Skin   Extensive Assist Verbally Normal                       Personal Care Assistance Level of Assistance  Bathing, Feeding, Dressing Bathing Assistance: Limited assistance Feeding assistance: Independent Dressing Assistance: Limited assistance     Functional Limitations Info   Sight, Hearing, Speech Sight Info: Adequate Hearing Info: Adequate Speech Info: Adequate    SPECIAL CARE FACTORS FREQUENCY  PT (By licensed PT), OT (By licensed OT)     PT Frequency: (5) OT Frequency: (5)            Contractures      Additional Factors Info  Code Status, Allergies Code Status Info: (Full Code. ) Allergies Info: (No Known Allergies. )           Current Medications (08/05/2017):  This is the current hospital active medication list Current Facility-Administered Medications  Medication Dose Route Frequency Provider Last Rate Last Dose  . 0.9 %  sodium chloride infusion   Intravenous Continuous Shaune Pollack, MD      . acetaminophen (TYLENOL) tablet 650 mg  650 mg Oral Q6H PRN Shaune Pollack, MD       Or  . acetaminophen (TYLENOL) suppository 650 mg  650 mg Rectal Q6H PRN Shaune Pollack, MD      . albuterol (PROVENTIL) (2.5 MG/3ML) 0.083% nebulizer solution 2.5 mg  2.5 mg Nebulization Q2H PRN Shaune Pollack, MD      . bisacodyl (DULCOLAX) EC tablet 5 mg  5 mg Oral Daily PRN Shaune Pollack, MD      . enoxaparin (LOVENOX) injection 40 mg  40 mg Subcutaneous Q24H Shaune Pollack, MD      . HYDROcodone-acetaminophen (NORCO/VICODIN) 5-325 MG per tablet 1-2 tablet  1-2 tablet Oral Q4H PRN Shaune Pollack, MD      .  insulin aspart (novoLOG) injection 0-5 Units  0-5 Units Subcutaneous QHS Shaune Pollack, MD      . insulin aspart (novoLOG) injection 0-9 Units  0-9 Units Subcutaneous TID WC Shaune Pollack, MD   3 Units at 08/05/17 1256  . ondansetron (ZOFRAN) tablet 4 mg  4 mg Oral Q6H PRN Shaune Pollack, MD       Or  . ondansetron [facility]) injection 4 mg  4 mg Intravenous Q6H PRN Shaune Pollack, MD      . piperacillin-tazobactam (ZOSYN) IVPB 3.375 g  3.375 g Intravenous Q8H Hallaji, Sheema M, RPH      . povidone-iodine (BETADINE) 7.5 % scrub   Topical Once Gwyneth Revels, DPM      . senna-docusate (Senokot-S) tablet 1 tablet  1 tablet Oral QHS PRN Shaune Pollack, MD      . vancomycin (VANCOCIN) IVPB 1000 mg/200 mL  premix  1,000 mg Intravenous STAT Gardner Candle, [facility]         Discharge Medications: Please see discharge summary for a list of discharge medications.  Relevant Imaging Results:  Relevant Lab Results:   Additional Information (SSN: [id number])  Aidah Forquer, Darleen Crocker, LCSW

## 2017-08-05 NOTE — Consult Note (Signed)
ORTHOPAEDIC CONSULTATION  REQUESTING PHYSICIAN: Shaune Pollack, MD  Chief Complaint: Left foot infection  HPI: Miguel Harvey is a 72 y.o. male who complains of infection to his left foot.  He has a known history of below-knee amputation on his right side that occurred approximately 3 weeks ago in [place].  He is been in rehab facility since that time.  Right side was doing well with just some dehiscence recently of the amputation site.  The family has been concerned about the left foot and was seen in the outpatient clinic by my partner Dr. Alberteen Spindle.  He noted severe necrosis around the fourth fifth toe with an area of fluctuance under the plantar aspect of the left foot concerning for infection abscess to this region.  Admitted for IV antibiotics further work-up and likely debridement of infection on the left foot.  Past Medical History:  Diagnosis Date  . CHF (congestive heart failure) (HCC)   . Diabetes mellitus   . Hypertension    Past Surgical History:  Procedure Laterality Date  . AMPUTATION Right 07/02/2017   Procedure: RIGHT BELOW KNEE AMPUTATION;  Surgeon: Nadara Mustard, MD;  Location: [facility] OR;  Service: Orthopedics;  Laterality: Right;  . cyst removal from  right leg about 6 years ago    . LEFT HEART CATHETERIZATION WITH CORONARY ANGIOGRAM N/A 05/11/2011   Procedure: LEFT HEART CATHETERIZATION WITH CORONARY ANGIOGRAM;  Surgeon: Lennette Bihari, MD;  Location[facility] C[facility] LAB;  Service: Cardiovascular;  Laterality: N/A;   Social History   Socioeconomic History  . Marital status: Single    Spouse name: Not on file  . Number of children: Not on file  . Years of education: Not on file  . Highest education level: Not on file  Occupational History  . Not on file  Social Needs  . Financial resource strain: Not on file  . Food insecurity:    Worry: Not on file    Inability: Not on file  . Transportation needs:    Medical: Not on file    Non-medical: Not on file  Tobacco Use  .  Smoking status: Current Every Day Smoker    Packs/day: 2.00    Years: 40.00    Pack years: 80.00    Types: Cigarettes  . Smokeless tobacco: Former Neurosurgeon    Quit date: 05/07/2011  Substance and Sexual Activity  . Alcohol use: Yes    Alcohol/week: 3.6 oz    Types: 6 Cans of beer per week  . Drug use: Not on file  . Sexual activity: Not on file  Lifestyle  . Physical activity:    Days per week: Not on file    Minutes per session: Not on file  . Stress: Not on file  Relationships  . Social connections:    Talks on phone: Not on file    Gets together: Not on file    Attends religious service: Not on file    Active member of club or organization: Not on file    Attends meetings of clubs or organizations: Not on file    Relationship status: Not on file  Other Topics Concern  . Not on file  Social History Narrative  . Not on file   Family History  Problem Relation Age of Onset  . CAD Other   . Diabetes Other    No Known Allergies Prior to Admission medications   Medication Sig Start Date End Date Taking? Authorizing Provider  atorvastatin (LIPITOR) 40 MG tablet  take 1 tablet by mouth once daily Patient not taking: Reported on 12/23/2016 12/30/15   Beaulah Dinning, MD  carvedilol (COREG) 6.25 MG tablet take 1 tablet by mouth twice a day with meals Patient not taking: Reported on 12/23/2016 05/01/14   Briscoe Deutscher, DO  lisinopril (PRINIVIL,ZESTRIL) 10 MG tablet Take 1 tablet (10 mg total) by mouth daily. 07/12/17   Penny Pia, MD  metFORMIN (GLUCOPHAGE) 1000 MG tablet Take 0.5 tablets (500 mg total) by mouth 2 (two) times daily with a meal. Patient not taking: Reported on 12/23/2016 09/02/12   Brent Bulla, MD  tamsulosin (FLOMAX) 0.4 MG CAPS capsule Take 1 capsule (0.4 mg total) by mouth daily. 07/12/17   Penny Pia, MD   No results found.  Positive ROS: All other systems have been reviewed and were otherwise negative with the exception of those mentioned in the HPI and as  above.  12 point ROS was performed.  Physical Exam: General: Alert and oriented.  No apparent distress.  Vascular:  Left foot:Dorsalis Pedis:  absent Posterior Tibial:  absent  Right foot: Status post BKA  Neuro:absent protective sensation  Derm: Right foot is status post BKA.  They did show a small open area along the incision site from the below the knee amputation.  Will ask vascular surgery to evaluate.  On the left foot there is noted necrosis around the fourth and fifth toe in the webspace as well as the third and fourth toes.  Noted irritation erythema to this area.  There is a noted fluctuant region on the plantar aspect of the left midfoot concerning for abscess.  Ortho/MS: Status post right-sided BKA. Left foot with edema.  He is neuropathic.  No crepitus with range of motion though.  Assessment: Peripheral vascular disease with necrotic tissue left forefoot with abscess  High risk of below-knee amputation  Status post amputation below the knee right side  Plan: Last vascular surgery to evaluate.  I am going to order an MRI as well as x-rays of the left foot to further evaluate this foot.  I do believe he will need debridement with I&D and he may lose toes on the left foot to start with at this time.  He is at high risk of below the knee amputation on the left side but will try limb salvage at this time.  I do need to flush out all the infection and necrotic tissue.  We will place him on the schedule for tomorrow.  He is n.p.o. after midnight tonight.  Empiric antibiotics by medicine.    Irean Hong, DPM Cell [phone]   08/05/2017 11:52 AM

## 2017-08-06 ENCOUNTER — Inpatient Hospital Stay: Payer: Medicare Other | Admitting: Anesthesiology

## 2017-08-06 ENCOUNTER — Inpatient Hospital Stay: Payer: Medicare Other

## 2017-08-06 ENCOUNTER — Encounter: Payer: Self-pay | Admitting: Certified Registered Nurse Anesthetist

## 2017-08-06 ENCOUNTER — Inpatient Hospital Stay (HOSPITAL_COMMUNITY)
Admission: AD | Admit: 2017-08-06 | Discharge: 2017-08-06 | Disposition: A | Discharge status: Home / Self Care | Payer: Medicare Other | Source: Ambulatory Visit | Attending: Internal Medicine | Admitting: Internal Medicine

## 2017-08-06 ENCOUNTER — Encounter
Admission: AD | Disposition: A | Discharge status: Skilled Nursing Facility | Payer: Self-pay | Source: Ambulatory Visit | Attending: Internal Medicine

## 2017-08-06 DIAGNOSIS — I428 Other cardiomyopathies: Secondary | ICD-10-CM

## 2017-08-06 DIAGNOSIS — I340 Nonrheumatic mitral (valve) insufficiency: Secondary | ICD-10-CM

## 2017-08-06 DIAGNOSIS — I469 Cardiac arrest, cause unspecified: Secondary | ICD-10-CM

## 2017-08-06 DIAGNOSIS — E11621 Type 2 diabetes mellitus with foot ulcer: Secondary | ICD-10-CM

## 2017-08-06 DIAGNOSIS — L089 Local infection of the skin and subcutaneous tissue, unspecified: Secondary | ICD-10-CM

## 2017-08-06 DIAGNOSIS — I10 Essential (primary) hypertension: Secondary | ICD-10-CM

## 2017-08-06 DIAGNOSIS — I493 Ventricular premature depolarization: Secondary | ICD-10-CM

## 2017-08-06 DIAGNOSIS — Z89511 Acquired absence of right leg below knee: Secondary | ICD-10-CM

## 2017-08-06 DIAGNOSIS — M79605 Pain in left leg: Secondary | ICD-10-CM

## 2017-08-06 LAB — AEROBIC/ANAEROBIC CULTURE W GRAM STAIN (SURGICAL/DEEP WOUND)

## 2017-08-06 LAB — CBC
HCT: 28.9 % — ABNORMAL LOW (ref 40.0–52.0)
HEMOGLOBIN: 9.7 g/dL — AB (ref 13.0–18.0)
MCH: 28.2 pg (ref 26.0–34.0)
MCHC: 33.5 g/dL (ref 32.0–36.0)
MCV: 84.1 fL (ref 80.0–100.0)
Platelets: 215 10*3/uL (ref 150–440)
RBC: 3.44 MIL/uL — AB (ref 4.40–5.90)
RDW: 16 % — ABNORMAL HIGH (ref 11.5–14.5)
WBC: 8.2 10*3/uL (ref 3.8–10.6)

## 2017-08-06 LAB — PHOSPHORUS
Phosphorus: 5.4 mg/dL — ABNORMAL HIGH (ref 2.5–4.6)
Phosphorus: 4.7 mg/dL — ABNORMAL HIGH (ref 2.5–4.6)

## 2017-08-06 LAB — BASIC METABOLIC PANEL
ANION GAP: 7 (ref 5–15)
Anion gap: 6 (ref 5–15)
Anion gap: 8 (ref 5–15)
BUN: 12 mg/dL (ref 6–20)
BUN: 13 mg/dL (ref 6–20)
BUN: 14 mg/dL (ref 6–20)
CALCIUM: 8.1 mg/dL — AB (ref 8.9–10.3)
CALCIUM: 7.8 mg/dL — AB (ref 8.9–10.3)
CHLORIDE: 104 mmol/L (ref 101–111)
CO2: 30 mmol/L (ref 22–32)
CO2: 27 mmol/L (ref 22–32)
CO2: 27 mmol/L (ref 22–32)
CREATININE: 0.8 mg/dL (ref 0.61–1.24)
CREATININE: 0.73 mg/dL (ref 0.61–1.24)
Calcium: 7.6 mg/dL — ABNORMAL LOW (ref 8.9–10.3)
Chloride: 101 mmol/L (ref 101–111)
Chloride: 101 mmol/L (ref 101–111)
Creatinine, Ser: 0.83 mg/dL (ref 0.61–1.24)
GFR calc Af Amer: >60 mL/min (ref >60)
GFR calc non Af Amer: >60 mL/min (ref >60)
GFR calc non Af Amer: >60 mL/min (ref >60)
GLUCOSE: 137 mg/dL — AB (ref 65–99)
GLUCOSE: 166 mg/dL — AB (ref 65–99)
Glucose, Bld: 124 mg/dL — ABNORMAL HIGH (ref 65–99)
POTASSIUM: 4 mmol/L (ref 3.5–5.1)
Potassium: 4.1 mmol/L (ref 3.5–5.1)
Potassium: 3.9 mmol/L (ref 3.5–5.1)
SODIUM: 138 mmol/L (ref 135–145)
Sodium: 137 mmol/L (ref 135–145)
Sodium: 136 mmol/L (ref 135–145)

## 2017-08-06 LAB — CBC WITH DIFFERENTIAL/PLATELET
BASOS ABS: 0.1 10*3/uL (ref 0–0.1)
Basophils Relative: 1 %
Eosinophils Absolute: 0.1 10*3/uL (ref 0–0.7)
Eosinophils Relative: 2 %
HEMATOCRIT: 30.9 % — AB (ref 40.0–52.0)
Hemoglobin: 10 g/dL — ABNORMAL LOW (ref 13.0–18.0)
LYMPHS PCT: 17 %
Lymphs Abs: 1.5 10*3/uL (ref 1.0–3.6)
MCH: 27.7 pg (ref 26.0–34.0)
MCHC: 32.4 g/dL (ref 32.0–36.0)
MCV: 85.4 fL (ref 80.0–100.0)
Monocytes Absolute: 0.5 10*3/uL (ref 0.2–1.0)
Monocytes Relative: 6 %
NEUTROS ABS: 6.4 10*3/uL (ref 1.4–6.5)
Neutrophils Relative %: 74 %
PLATELETS: 285 10*3/uL (ref 150–440)
RBC: 3.62 MIL/uL — AB (ref 4.40–5.90)
RDW: 16 % — ABNORMAL HIGH (ref 11.5–14.5)
WBC: 8.6 10*3/uL (ref 3.8–10.6)

## 2017-08-06 LAB — GLUCOSE, CAPILLARY
GLUCOSE-CAPILLARY: 164 mg/dL — AB (ref 65–99)
Glucose-Capillary: 142 mg/dL — ABNORMAL HIGH (ref 65–99)
Glucose-Capillary: 145 mg/dL — ABNORMAL HIGH (ref 65–99)
Glucose-Capillary: 107 mg/dL — ABNORMAL HIGH (ref 65–99)

## 2017-08-06 LAB — TRIGLYCERIDES: TRIGLYCERIDES: 71 mg/dL (ref <150)

## 2017-08-06 LAB — PROTIME-INR
INR: 1.21
PROTHROMBIN TIME: 15.2 s (ref 11.4–15.2)

## 2017-08-06 LAB — TROPONIN I
Troponin I: 0.03 ng/mL (ref <0.03)
Troponin I: 0.05 ng/mL (ref <0.03)

## 2017-08-06 LAB — ECHOCARDIOGRAM COMPLETE
HEIGHTINCHES: 69 in
Weight: 3220.4797 oz

## 2017-08-06 LAB — MAGNESIUM
MAGNESIUM: 1.6 mg/dL — AB (ref 1.7–2.4)
Magnesium: 1.8 mg/dL (ref 1.7–2.4)
Magnesium: 1.8 mg/dL (ref 1.7–2.4)

## 2017-08-06 SURGERY — CANCELLED PROCEDURE
Anesthesia: General

## 2017-08-06 MED ORDER — PROPOFOL 1000 MG/100ML IV EMUL
5.0000 ug/kg/min | INTRAVENOUS | Status: DC
  Administered 2017-08-06: 60 ug/kg/min via INTRAVENOUS
  Administered 2017-08-06: 30 ug/kg/min via INTRAVENOUS
  Filled 2017-08-06 (×3): qty 100

## 2017-08-06 MED ORDER — LACTATED RINGERS IV SOLN
INTRAVENOUS | Status: DC | PRN
  Administered 2017-08-06: 10:00:00 via INTRAVENOUS

## 2017-08-06 MED ORDER — NOREPINEPHRINE 4 MG/250ML-% IV SOLN
0.0000 ug/min | INTRAVENOUS | Status: DC
  Administered 2017-08-06: 20 ug/min via INTRAVENOUS

## 2017-08-06 MED ORDER — PROMETHAZINE HCL 25 MG/ML IJ SOLN: 6.2500 mg | INTRAMUSCULAR | Status: DC | PRN

## 2017-08-06 MED ORDER — FENTANYL CITRATE (PF) 100 MCG/2ML IJ SOLN
INTRAMUSCULAR | Status: AC
  Filled 2017-08-06: qty 2

## 2017-08-06 MED ORDER — LIDOCAINE HCL (CARDIAC) PF 100 MG/5ML IV SOSY
PREFILLED_SYRINGE | INTRAVENOUS | Status: DC | PRN
  Administered 2017-08-06: 100 mg via INTRAVENOUS

## 2017-08-06 MED ORDER — GADOBENATE DIMEGLUMINE 529 MG/ML IV SOLN: 20.0000 mL | Freq: Once | INTRAVENOUS | Status: DC | PRN

## 2017-08-06 MED ORDER — BUPIVACAINE HCL (PF) 0.5 % IJ SOLN
INTRAMUSCULAR | Status: AC
  Filled 2017-08-06: qty 30

## 2017-08-06 MED ORDER — EPINEPHRINE PF 1 MG/10ML IJ SOSY
PREFILLED_SYRINGE | INTRAMUSCULAR | Status: DC | PRN
  Administered 2017-08-06: 0.1 mg via INTRAVENOUS
  Administered 2017-08-06: 0.5 mg via INTRAVENOUS

## 2017-08-06 MED ORDER — EPINEPHRINE PF 1 MG/10ML IJ SOSY
PREFILLED_SYRINGE | INTRAMUSCULAR | Status: AC
  Filled 2017-08-06: qty 10

## 2017-08-06 MED ORDER — LIDOCAINE HCL (PF) 1 % IJ SOLN
INTRAMUSCULAR | Status: AC
  Filled 2017-08-06: qty 30

## 2017-08-06 MED ORDER — MAGNESIUM SULFATE 2 GM/50ML IV SOLN
2.0000 g | Freq: Once | INTRAVENOUS | Status: AC
Start: 2017-08-06 — End: 2017-08-06
  Administered 2017-08-06: 2 g via INTRAVENOUS
  Filled 2017-08-06: qty 50

## 2017-08-06 MED ORDER — GLYCOPYRROLATE 0.2 MG/ML IJ SOLN
INTRAMUSCULAR | Status: AC
  Filled 2017-08-06: qty 2

## 2017-08-06 MED ORDER — PROPOFOL 10 MG/ML IV BOLUS
INTRAVENOUS | Status: DC | PRN
  Administered 2017-08-06: 120 mg via INTRAVENOUS

## 2017-08-06 MED ORDER — EPHEDRINE SULFATE 50 MG/ML IJ SOLN
INTRAMUSCULAR | Status: DC | PRN
  Administered 2017-08-06 (×2): 10 mg via INTRAVENOUS

## 2017-08-06 MED ORDER — PHENYLEPHRINE HCL 10 MG/ML IJ SOLN
INTRAMUSCULAR | Status: AC
  Filled 2017-08-06: qty 1

## 2017-08-06 MED ORDER — MEPERIDINE HCL 25 MG/ML IJ SOLN
6.2500 mg | INTRAMUSCULAR | Status: DC | PRN
  Filled 2017-08-06: qty 1

## 2017-08-06 MED ORDER — LIDOCAINE-EPINEPHRINE 1 %-1:100000 IJ SOLN
INTRAMUSCULAR | Status: AC
  Filled 2017-08-06: qty 1

## 2017-08-06 MED ORDER — SODIUM CHLORIDE 0.9% FLUSH: 10.0000 mL | INTRAVENOUS | Status: DC | PRN

## 2017-08-06 MED ORDER — PROPOFOL 10 MG/ML IV BOLUS
INTRAVENOUS | Status: AC
  Filled 2017-08-06: qty 20

## 2017-08-06 MED ORDER — CHLORHEXIDINE GLUCONATE 0.12% ORAL RINSE (MEDLINE KIT)
15.0000 mL | Freq: Two times a day (BID) | OROMUCOSAL | Status: DC
  Administered 2017-08-06 – 2017-08-07 (×2): 15 mL via OROMUCOSAL

## 2017-08-06 MED ORDER — ATROPINE SULFATE 1 MG/10ML IJ SOSY
PREFILLED_SYRINGE | INTRAMUSCULAR | Status: AC
  Filled 2017-08-06: qty 10

## 2017-08-06 MED ORDER — VASOPRESSIN 20 UNIT/ML IV SOLN
INTRAVENOUS | Status: AC
  Filled 2017-08-06: qty 1

## 2017-08-06 MED ORDER — GLYCOPYRROLATE 0.2 MG/ML IJ SOLN
INTRAMUSCULAR | Status: DC | PRN
  Administered 2017-08-06: 0.4 mg via INTRAVENOUS

## 2017-08-06 MED ORDER — SODIUM CHLORIDE 0.9% FLUSH
10.0000 mL | Freq: Two times a day (BID) | INTRAVENOUS | Status: DC
  Administered 2017-08-06 – 2017-08-09 (×6): 10 mL
  Administered 2017-08-09: 30 mL
  Administered 2017-08-10 – 2017-08-14 (×7): 10 mL

## 2017-08-06 MED ORDER — FENTANYL CITRATE (PF) 100 MCG/2ML IJ SOLN
25.0000 ug | INTRAMUSCULAR | Status: DC | PRN
  Administered 2017-08-06: 50 ug via INTRAVENOUS
  Filled 2017-08-06: qty 2

## 2017-08-06 MED ORDER — EPHEDRINE SULFATE 50 MG/ML IJ SOLN
INTRAMUSCULAR | Status: AC
  Filled 2017-08-06: qty 1

## 2017-08-06 MED ORDER — OXYCODONE HCL 5 MG/5ML PO SOLN
5.0000 mg | Freq: Once | ORAL | Status: DC | PRN
Start: 2017-08-06 — End: 2017-08-06

## 2017-08-06 MED ORDER — LIDOCAINE HCL (PF) 2 % IJ SOLN
INTRAMUSCULAR | Status: AC
  Filled 2017-08-06: qty 10

## 2017-08-06 MED ORDER — ROCURONIUM BROMIDE 50 MG/5ML IV SOLN
INTRAVENOUS | Status: AC
  Filled 2017-08-06: qty 1

## 2017-08-06 MED ORDER — OXYCODONE HCL 5 MG PO TABS: 5.0000 mg | ORAL_TABLET | Freq: Once | ORAL | Status: DC | PRN

## 2017-08-06 MED ORDER — MIDAZOLAM HCL 2 MG/2ML IJ SOLN
INTRAMUSCULAR | Status: AC
  Filled 2017-08-06: qty 2

## 2017-08-06 MED ORDER — MIDAZOLAM HCL 2 MG/2ML IJ SOLN
INTRAMUSCULAR | Status: DC | PRN
  Administered 2017-08-06: 1 mg via INTRAVENOUS

## 2017-08-06 MED ORDER — ORAL CARE MOUTH RINSE
15.0000 mL | OROMUCOSAL | Status: DC
  Administered 2017-08-06 – 2017-08-07 (×7): 15 mL via OROMUCOSAL

## 2017-08-06 MED ORDER — VASOPRESSIN 20 UNIT/ML IV SOLN
INTRAVENOUS | Status: DC | PRN
Start: 2017-08-06 — End: 2017-08-06
  Administered 2017-08-06 (×2): 2 [IU] via INTRAVENOUS

## 2017-08-06 MED ORDER — ATROPINE SULFATE 1 MG/ML IJ SOLN
INTRAMUSCULAR | Status: DC | PRN
  Administered 2017-08-06 (×2): .5 mg via INTRAVENOUS

## 2017-08-06 MED ORDER — PHENYLEPHRINE HCL 10 MG/ML IJ SOLN
INTRAMUSCULAR | Status: DC | PRN
  Administered 2017-08-06 (×2): 100 ug via INTRAVENOUS

## 2017-08-06 MED ORDER — FAMOTIDINE IN NACL 20-0.9 MG/50ML-% IV SOLN
20.0000 mg | Freq: Two times a day (BID) | INTRAVENOUS | Status: DC
  Administered 2017-08-06 – 2017-08-09 (×7): 20 mg via INTRAVENOUS
  Filled 2017-08-06 (×7): qty 50

## 2017-08-06 SURGICAL SUPPLY — 63 items
BANDAGE ACE 4X5 VEL STRL LF (GAUZE/BANDAGES/DRESSINGS) ×3 IMPLANT
BLADE OSC/SAGITTAL MD 5.5X18 (BLADE) IMPLANT
BLADE OSCILLATING/SAGITTAL (BLADE)
BLADE SW THK.38XMED LNG THN (BLADE) IMPLANT
BNDG CMPR 75X21 PLY HI ABS (MISCELLANEOUS) ×2
BNDG COHESIVE 4X5 TAN STRL (GAUZE/BANDAGES/DRESSINGS) ×3 IMPLANT
BNDG COHESIVE 6X5 TAN STRL LF (GAUZE/BANDAGES/DRESSINGS) ×3 IMPLANT
BNDG CONFORM 3 STRL LF (GAUZE/BANDAGES/DRESSINGS) ×3 IMPLANT
BNDG ESMARK 4X12 TAN STRL LF (GAUZE/BANDAGES/DRESSINGS) ×3 IMPLANT
BNDG GAUZE 4.5X4.1 6PLY STRL (MISCELLANEOUS) ×3 IMPLANT
CANISTER SUCT 1200ML W/VALVE (MISCELLANEOUS) ×1 IMPLANT
CANISTER SUCT 3000ML PPV (MISCELLANEOUS) ×1 IMPLANT
CUFF TOURN 18 STER (MISCELLANEOUS) ×1 IMPLANT
CUFF TOURN DUAL PL 12 NO SLV (MISCELLANEOUS) ×1 IMPLANT
DRAPE FLUOR MINI C-ARM 54X84 (DRAPES) IMPLANT
DRAPE XRAY CASSETTE 23X24 (DRAPES) IMPLANT
DRESSING ALLEVYN 4X4 (MISCELLANEOUS) IMPLANT
DURAPREP 26ML APPLICATOR (WOUND CARE) ×3 IMPLANT
ELECT REM PT RETURN 9FT ADLT (ELECTROSURGICAL)
ELECTRODE REM PT RTRN 9FT ADLT (ELECTROSURGICAL) ×1 IMPLANT
GAUZE PACKING 1/4 X5 YD (GAUZE/BANDAGES/DRESSINGS) ×3 IMPLANT
GAUZE PACKING IODOFORM 1X5 (MISCELLANEOUS) ×3 IMPLANT
GAUZE PETRO XEROFOAM 1X8 (MISCELLANEOUS) ×1 IMPLANT
GAUZE SPONGE 4X4 12PLY STRL (GAUZE/BANDAGES/DRESSINGS) ×3 IMPLANT
GAUZE STRETCH 2X75IN STRL (MISCELLANEOUS) ×3 IMPLANT
GLOVE BIO SURGEON STRL SZ7.5 (GLOVE) ×3 IMPLANT
GLOVE INDICATOR 8.0 STRL GRN (GLOVE) ×3 IMPLANT
GOWN STRL REUS W/ TWL LRG LVL3 (GOWN DISPOSABLE) ×2 IMPLANT
GOWN STRL REUS W/TWL LRG LVL3 (GOWN DISPOSABLE)
GOWN STRL REUS W/TWL MED LVL3 (GOWN DISPOSABLE) ×6 IMPLANT
HANDPIECE VERSAJET DEBRIDEMENT (MISCELLANEOUS) IMPLANT
IV NS 1000ML (IV SOLUTION) ×3
IV NS 1000ML BAXH (IV SOLUTION) ×2 IMPLANT
KIT TURNOVER KIT A (KITS) ×3 IMPLANT
LABEL OR SOLS (LABEL) ×3 IMPLANT
NDL FILTER BLUNT 18X1 1/2 (NEEDLE) ×1 IMPLANT
NDL HYPO 25X1 1.5 SAFETY (NEEDLE) ×1 IMPLANT
NEEDLE FILTER BLUNT 18X 1/2SAF (NEEDLE) ×1
NEEDLE FILTER BLUNT 18X1 1/2 (NEEDLE) ×2 IMPLANT
NEEDLE HYPO 25X1 1.5 SAFETY (NEEDLE) ×3 IMPLANT
NS IRRIG 500ML POUR BTL (IV SOLUTION) ×3 IMPLANT
PACK EXTREMITY ARMC (MISCELLANEOUS) ×1 IMPLANT
PAD ABD DERMACEA PRESS 5X9 (GAUZE/BANDAGES/DRESSINGS) ×1 IMPLANT
PULSAVAC PLUS IRRIG FAN TIP (DISPOSABLE)
RASP SM TEAR CROSS CUT (RASP) IMPLANT
SHIELD FULL FACE ANTIFOG 7M (MISCELLANEOUS) ×1 IMPLANT
SOL .9 NS 3000ML IRR  AL (IV SOLUTION) ×1
SOL .9 NS 3000ML IRR AL (IV SOLUTION) ×2
SOL .9 NS 3000ML IRR UROMATIC (IV SOLUTION) ×2 IMPLANT
SOL PREP PVP 2OZ (MISCELLANEOUS)
SOLUTION PREP PVP 2OZ (MISCELLANEOUS) ×1 IMPLANT
STOCKINETTE IMPERVIOUS 9X36 MD (GAUZE/BANDAGES/DRESSINGS) ×1 IMPLANT
SUT ETHILON 2 0 FS 18 (SUTURE) ×2 IMPLANT
SUT ETHILON 4-0 (SUTURE)
SUT ETHILON 4-0 FS2 18XMFL BLK (SUTURE)
SUT VIC AB 3-0 SH 27 (SUTURE)
SUT VIC AB 3-0 SH 27X BRD (SUTURE) ×1 IMPLANT
SUT VIC AB 4-0 FS2 27 (SUTURE) ×1 IMPLANT
SUTURE ETHLN 4-0 FS2 18XMF BLK (SUTURE) ×1 IMPLANT
SWAB CULTURE AMIES ANAERIB BLU (MISCELLANEOUS) IMPLANT
SYR 10ML LL (SYRINGE) ×2 IMPLANT
SYR 3ML LL SCALE MARK (SYRINGE) ×1 IMPLANT
TIP FAN IRRIG PULSAVAC PLUS (DISPOSABLE) ×1 IMPLANT

## 2017-08-06 NOTE — Anesthesia Preprocedure Evaluation (Signed)
Anesthesia Evaluation  Patient identified by MRN, date of birth, ID band Patient awake    Reviewed: Allergy & Precautions, NPO status , Patient's Chart, lab work & pertinent test results  History of Anesthesia Complications Negative for: history of anesthetic complications  Airway Mallampati: II  TM Distance: >3 FB Neck ROM: Full    Dental  (+) Poor Dentition, Missing, Loose   Pulmonary neg sleep apnea, neg COPD, Current Smoker,    breath sounds clear to auscultation- rhonchi (-) wheezing      Cardiovascular hypertension, + CAD, + Past MI and +CHF  (-) Cardiac Stents and (-) CABG  Rhythm:Regular Rate:Normal - Systolic murmurs and - Diastolic murmurs Echo 07/03/17: - Left ventricle: The cavity size was mildly dilated. Wall   thickness was increased in a pattern of mild LVH. Systolic   function was severely reduced. The estimated ejection fraction   was in the range of 25% to 30%. Diffuse hypokinesis. Doppler   parameters are consistent with restrictive physiology, indicative   of decreased left ventricular diastolic compliance and/or   increased left atrial pressure. - Mitral valve: There was mild regurgitation. - Pulmonary arteries: Systolic pressure was mildly increased.   Neuro/Psych negative neurological ROS  negative psych ROS   GI/Hepatic negative GI ROS, Neg liver ROS,   Endo/Other  diabetes, Oral Hypoglycemic Agents  Renal/GU negative Renal ROS     Musculoskeletal negative musculoskeletal ROS (+)   Abdominal (+) - obese,   Peds  Hematology negative hematology ROS (+)   Anesthesia Other Findings Past Medical History: No date: CHF (congestive heart failure) (HCC) No date: Diabetes mellitus No date: Hypertension   Reproductive/Obstetrics                             Anesthesia Physical Anesthesia Plan  ASA: III  Anesthesia Plan: General   Post-op Pain Management:     Induction: Intravenous  PONV Risk Score and Plan: 0 and Ondansetron  Airway Management Planned: LMA  Additional Equipment:   Intra-op Plan:   Post-operative Plan:   Informed Consent: I have reviewed the patients History and Physical, chart, labs and discussed the procedure including the risks, benefits and alternatives for the proposed anesthesia with the patient or authorized representative who has indicated his/her understanding and acceptance.   Dental advisory given  Plan Discussed with: CRNA and Anesthesiologist  Anesthesia Plan Comments:         Anesthesia Quick Evaluation

## 2017-08-06 NOTE — Consult Note (Signed)
[facility] Vascular Consult Note  MRN : [id number]  Miguel Harvey is a 72 y.o. ([birth year]) male who presents with chief complaint of No chief complaint on file.  History of Present Illness:  The patient is a 72 year old male with a past medical history of coronary artery disease, dyslipidemia, tobacco dependence, diabetes mellitus, hypertension, diastolic heart failure who has been treated by Dr. Graciela Husbands for a diabetic foot ulceration/infection.  During the patient's last office visit with podiatry he was admitted directly to the hospital for worsening infection.  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  The patient has been in a rehab facility status post discharge.  The patient has had a long-standing ulceration to the left foot which has not shown any signs of healing.  The patient notes that it has progressively worsened.  Patient states that during his last visit with Dr. Graciela Husbands his ulcer had progressed to the point where he was directly admitted to the hospital for further care.  Patient notes some intermittent claudication however has not ambulated much in recent months.  Denies any rest pain.  Patient states that before admission he was experiencing chills.  Today, the patient denies any fever, nausea vomiting.  A consultation was placed by internal medicine, Dr. Imogene Burn for further recommendations.  Current Facility-Administered Medications  Medication Dose Route Frequency Provider Last Rate Last Dose  . 0.9 %  sodium chloride infusion   Intravenous Continuous Shaune Pollack, MD 75 mL/hr at 08/05/17 1518    . [MAR Hold] acetaminophen (TYLENOL) tablet 650 mg  650 mg Oral Q6H PRN Shaune Pollack, MD       Or  . Mitzi Hansen Hold] acetaminophen (TYLENOL) suppository 650 mg  650 mg Rectal Q6H PRN Shaune Pollack, MD      . Mitzi Hansen Hold] albuterol (PROVENTIL) (2.5 MG/3ML) 0.083% nebulizer solution 2.5 mg   2.5 mg Nebulization Q2H PRN Shaune Pollack, MD      . Mitzi Hansen Hold] atorvastatin (LIPITOR) tablet 40 mg  40 mg Oral QPM Shaune Pollack, MD   40 mg at 08/05/17 1715  . [MAR Hold] bisacodyl (DULCOLAX) EC tablet 5 mg  5 mg Oral Daily PRN Shaune Pollack, MD      . Mitzi Hansen Hold] carvedilol (COREG) tablet 6.25 mg  6.25 mg Oral BID WC Shaune Pollack, MD   6.25 mg at 08/06/17 4098  . [MAR Hold] enoxaparin (LOVENOX) injection 40 mg  40 mg Subcutaneous Q24H Shaune Pollack, MD      . Mitzi Hansen Hold] gadobenate dimeglumine (MULTIHANCE) injection 20 mL  20 mL Intravenous Once PRN Shaune Pollack, MD      . Mitzi Hansen Hold] guaiFENesin-dextromethorphan (ROBITUSSIN DM) 100-10 MG/5ML syrup 10 mL  10 mL Oral Q4H PRN Shaune Pollack, MD      . Mitzi Hansen Hold] HYDROcodone-acetaminophen (NORCO/VICODIN) 5-325 MG per tablet 1-2 tablet  1-2 tablet Oral Q4H PRN Shaune Pollack, MD   1 tablet at 08/06/17 [phone]  . [MAR Hold] insulin aspart (novoLOG) injection 0-5 Units  0-5 Units Subcutaneous QHS Shaune Pollack, MD      . Mitzi Hansen Hold] insulin aspart (novoLOG) injection 0-9 Units  0-9 Units Subcutaneous TID WC Shaune Pollack, MD   1 Units at 08/06/17 [phone]  . [MAR Hold] linagliptin (TRADJENTA) tablet 5 mg  5 mg Oral Daily Shaune Pollack, MD      . Mitzi Hansen Hold] ondansetron [facility]) tablet 4 mg  4 mg Oral Q6H  PRN Shaune Pollack, MD       Or  . Mitzi Hansen Hold] ondansetron [facility]) injection 4 mg  4 mg Intravenous Q6H PRN Shaune Pollack, MD      . Mitzi Hansen Hold] piperacillin-tazobactam (ZOSYN) IVPB 3.375 g  3.375 g Intravenous Q8H Gardner Candle, [facility]   Stopped at 08/06/17 8295  . povidone-iodine (BETADINE) 7.5 % scrub   Topical Once Gwyneth Revels, DPM      . Mitzi Hansen Hold] senna-docusate (Senokot-S) tablet 1 tablet  1 tablet Oral QHS PRN Shaune Pollack, MD      . Mitzi Hansen Hold] tamsulosin [facility]) capsule 0.4 mg  0.4 mg Oral Daily Shaune Pollack, MD      . Mitzi Hansen Hold] vancomycin [facility]) 1,500 mg in sodium chloride 0.9 % 500 mL IVPB  1,500 mg Intravenous Q12H Shaune Pollack, MD 250 mL/hr at 08/06/17 1007 1,500 mg at 08/06/17 1007    Past Medical History:  Diagnosis Date  . CHF (congestive heart failure) (HCC)   . Diabetes mellitus   . Hypertension    Past Surgical History:  Procedure Laterality Date  . AMPUTATION Right 07/02/2017   Procedure: RIGHT BELOW KNEE AMPUTATION;  Surgeon: Nadara Mustard, MD;  Location: [facility] OR;  Service: Orthopedics;  Laterality: Right;  . cyst removal from  right leg about 6 years ago    . LEFT HEART CATHETERIZATION WITH CORONARY ANGIOGRAM N/A 05/11/2011   Procedure: LEFT HEART CATHETERIZATION WITH CORONARY ANGIOGRAM;  Surgeon: Lennette Bihari, MD;  Location: [facility] CATH LAB;  Service: Cardiovascular;  Laterality: N/A;   Social History Social History   Tobacco Use  . Smoking status: Current Every Day Smoker    Packs/day: 2.00    Years: 40.00    Pack years: 80.00    Types: Cigarettes  . Smokeless tobacco: Former Neurosurgeon    Quit date: 05/07/2011  Substance Use Topics  . Alcohol use: Yes    Alcohol/week: 3.6 oz    Types: 6 Cans of beer per week  . Drug use: Not on file   Family History Family History  Problem Relation Age of Onset  . CAD Other   . Diabetes Other   The patient denies any family history of peripheral artery disease, venous disease or renal disease.  No Known Allergies  REVIEW OF SYSTEMS (Negative unless checked)  Constitutional: [] Weight loss  [] Fever  [x] Chills Cardiac: [] Chest pain   [] Chest pressure   [] Palpitations   [] Shortness of breath when laying flat   [] Shortness of breath at rest   [] Shortness of breath with exertion. Vascular:  [x] Pain in legs with walking   [x] Pain in legs at rest   [x] Pain in legs when laying flat   [x] Claudication   [x] Pain in feet when walking  [x] Pain in feet at rest  [x] Pain in feet when laying flat   [] History of DVT   [] Phlebitis   [] Swelling in legs   [] Varicose veins   [x] Non-healing ulcers Pulmonary:   [] Uses home oxygen   [] Productive cough   [] Hemoptysis   [] Wheeze  [] COPD   [] Asthma Neurologic:  [] Dizziness  [] Blackouts    [] Seizures   [] History of stroke   [] History of TIA  [] Aphasia   [] Temporary blindness   [] Dysphagia   [] Weakness or numbness in arms   [] Weakness or numbness in legs Musculoskeletal:  [] Arthritis   [] Joint swelling   [] Joint pain   [] Low back pain Hematologic:  [] Easy bruising  [] Easy bleeding   [] Hypercoagulable state   [] Anemic  [] Hepatitis Gastrointestinal:  []   Blood in stool   [] Vomiting blood  [] Gastroesophageal reflux/heartburn   [] Difficulty swallowing. Genitourinary:  [] Chronic kidney disease   [] Difficult urination  [] Frequent urination  [] Burning with urination   [] Blood in urine Skin:  [] Rashes   [x] Ulcers   [x] Wounds Psychological:  [] History of anxiety   []  History of major depression.  Physical Examination  Vitals:   08/05/17 1145 08/05/17 1639 08/05/17 2319 08/06/17 0946  BP: 116/72 129/88 114/85 134/87  Pulse: 70 82 91 71  Resp: 18 18 17 17   Temp: 98.9 F (37.2 C) 97.8 F (36.6 C) 98.7 F (37.1 C) (!) 97.1 F (36.2 C)  TempSrc: Oral Oral Oral Tympanic  SpO2:  99% 98% 97%  Weight: 201 lb 4.5 oz (91.3 kg)     Height: 5\' 9"  (1.753 m)      Body mass index is 29.72 kg/m. Gen:  WD/WN, NAD Head: [place]/AT, No temporalis wasting. Prominent temp pulse not noted. Ear/Nose/Throat: Hearing grossly intact, nares w/o erythema or drainage, oropharynx w/o Erythema/Exudate Eyes: Sclera non-icteric, conjunctiva clear Neck: Trachea midline.  No JVD.  Pulmonary:  Good air movement, respirations not labored, equal bilaterally.  Cardiac: RRR, normal S1, S2. Vascular:  Vessel Right Left  Radial Palpable Palpable  Ulnar Palpable Palpable  Brachial Palpable Palpable  Carotid Palpable, without bruit Palpable, without bruit  Aorta Not palpable N/A  Femoral Palpable Palpable  Popliteal BKA Non-Palpable  PT BKA Non-Palpable  DP BKA Non-Palpable   Right lower extremity: Below the knee amputation Left lower extremity: Necrotic ulcer noted to the fourth and fifth toes to the left foot.   Surrounding erythema noted.  Gastrointestinal: soft, non-tender/non-distended. No guarding/reflex.  Musculoskeletal: M/S 5/5 throughout. Mild to moderate left foot edema noted. Neurologic: Sensation grossly intact in extremities.  Symmetrical.  Speech is fluent. Motor exam as listed above. Psychiatric: Judgment intact, Mood & affect appropriate for pt's clinical situation. Dermatologic: As above Lymph : No Cervical, Axillary, or Inguinal lymphadenopathy.  CBC Lab Results  Component Value Date   WBC 8.2 08/06/2017   HGB 9.7 (L) 08/06/2017   HCT 28.9 (L) 08/06/2017   MCV 84.1 08/06/2017   PLT 215 08/06/2017   BMET    Component Value Date/Time   NA 137 08/06/2017 0506   K 4.1 08/06/2017 0506   CL 101 08/06/2017 0506   CO2 30 08/06/2017 0506   GLUCOSE 137 (H) 08/06/2017 0506   BUN 12 08/06/2017 0506   CREATININE 0.80 08/06/2017 0506   CREATININE 1.43 (H) 09/21/2012 1150   CALCIUM 8.1 (L) 08/06/2017 0506   GFRNONAA >60 08/06/2017 0506   GFRAA >60 08/06/2017 0506   Estimated Creatinine Clearance: 93.1 mL/min (by C-G formula based on SCr of 0.8 mg/dL).  COAG Lab Results  Component Value Date   INR 1.13 08/05/2017   INR 1.28 06/29/2017   INR 0.99 05/07/2011   Radiology Mr Foot Left Wo Contrast  Result Date: 08/06/2017 CLINICAL DATA:  Left fifth toe osteomyelitis. EXAM: MRI OF THE LEFT FOOT WITHOUT CONTRAST TECHNIQUE: Multiplanar, multisequence MR imaging of the left forefoot was performed. No intravenous contrast was administered. COMPARISON:  Left foot x-rays from yesterday. FINDINGS: Bones/Joint/Cartilage Increased marrow edema with osseous destruction and decreased T1 marrow signal involving the fifth metatarsal head, consistent with osteomyelitis. Near complete osseous destruction of the little toe phalanges. There is a angulated, pathologic fracture through the neck of the fifth metatarsal. Bone marrow edema extends throughout the fifth metatarsal shaft with preserved T1  marrow signal, likely reactive. There is also  abnormal marrow edema involving the fourth proximal phalanx. Although the T1 marrow signal is relatively preserved, there is probable early cortical destruction along the lateral base of the fourth proximal phalanx. Ligaments The fifth MTP collateral ligaments are likely eroded. Remaining toe collateral ligaments are intact. Lisfranc ligament is intact. Muscles and Tendons Flexor, peroneal and extensor compartment tendons are intact. No tenosynovitis. Increased T2 signal and atrophy within the intrinsic muscles of the forefoot, nonspecific, but likely related to diabetic muscle changes. Soft tissue There are multiple fluid collections in the plantar and dorsal soft tissues of the lateral forefoot. The dorsal fluid collection overlying the fourth and fifth distal metatarsals measures up to 6.9 cm in AP dimension. Smaller 2.4 cm fluid collection along the lateral aspect of the fifth metatarsal head. Slightly more ill-defined 5.7 cm fluid collection in the plantar soft tissues involving the plantar aponeurosis. Ill-defined fluid extends into the plantar aspect of the second webspace and base of the second and third toes. IMPRESSION: 1. 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 2. Multiple abscesses in the plantar and dorsal soft tissues of the lateral forefoot as described above. Electronically Signed   By: Obie Dredge M.D.   On: 08/06/2017 08:27   Dg Foot Complete Left  Result Date: 08/05/2017 CLINICAL DATA:  LEFT foot infection, prior RIGHT below-knee amputation fourth and fifth toe necrosis with fluctuance at plantar aspect question infection/abscess EXAM: LEFT FOOT - COMPLETE 3+ VIEW COMPARISON:  06/28/2017 FINDINGS: Diffuse osseous demineralization. Progressive bone destruction involving the base of the proximal phalanx of the LEFT fifth toe. New bone destruction involving the head and neck of  the LEFT fifth metatarsal consistent with progressive osteomyelitis, involving the fifth MTP joint. Diffuse osteolysis of the phalanges of the LEFT fifth toe. Remain joint spaces preserved. No additional fracture, dislocation or bone destruction. IMPRESSION: Progressive bone destruction involving the proximal phalanx of the LEFT fifth toe with new bone destruction of the head and neck of the LEFT fifth metatarsal consistent with progressive osteomyelitis. Electronically Signed   By: Ulyses Southward M.D.   On: 08/05/2017 15:18   Assessment/Plan The patient is a 72 year old male with multiple medical issues admitted to the hospital by podiatry for a worsening left foot ulceration/infection. 1. PAD: The patient has multiple risk factors for peripheral artery disease.  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  The patient presents with intermittent claudication however he does not ambulate much and recent months.  The patient has a necrotic ulceration to the left foot which shows no signs of healing.  Unable to palpate any left lower extremity pedal pulses.  Recommends a left lower extremity angiogram with possible intervention to assess the patient's anatomy and degree of contributing peripheral artery disease.  If appropriate, an attempt to revascularize the leg to move it at that time.  Procedure, risks and benefits explained to the patient.  All questions answered.  The patient would like to proceed.  We will plan on this for this afternoon with Dr. Gilda Crease. 2. Diabetes: On appropriate medications.Encouraged good control as its slows the progression of atherosclerotic disease 3.  Hyperlipidemia: On statin. Encouraged good control as its slows the progression of atherosclerotic disease. 4.  Hypertension: Encouraged good control as its slows the progression of atherosclerotic disease  Discussed with Dr.  Charlie Pitter A  [facility], PA-C  08/06/2017 10:28 AM  This note was created with Dragon medical transcription system.  Any error is purely unintentional.

## 2017-08-06 NOTE — Progress Notes (Signed)
Inpatient Diabetes Program Recommendations  AACE/ADA: New Consensus Statement on Inpatient Glycemic Control (2015)  Target Ranges:  Prepandial:   less than 140 mg/dL      Peak postprandial:   less than 180 mg/dL (1-2 hours)      Critically ill patients:  140 - 180 mg/dL  Results for Miguel Harvey, Miguel Harvey (MRN [id number]) as of 08/06/2017 08:17  Ref. Range 08/06/2017 05:06  Glucose Latest Ref Range: 65 - 99 mg/dL 765 (H)   Results for Miguel Harvey, Miguel Harvey (MRN [id number]) as of 08/06/2017 08:17  Ref. Range 08/05/2017 11:30 08/05/2017 16:39 08/05/2017 22:18  Glucose-Capillary Latest Ref Range: 65 - 99 mg/dL 681 (H) 275 (H) 170 (H)  Results for Miguel Harvey, Miguel Harvey (MRN [id number]) as of 08/06/2017 08:17  Ref. Range 06/29/2017 01:20 08/05/2017 11:58  Hemoglobin A1C Latest Ref Range: 4.8 - 5.6 % 9.2 (H) 9.5 (H)   Review of Glycemic Control  Diabetes history: DM2 Outpatient Diabetes medications: Metformin 500 mg BID, Tradjenta 5 mg daily, Amaryl 2 mg daily (had not started Amaryl yet; just prescribed on 08/05/17) Current orders for Inpatient glycemic control: Tradjenta 5 mg daily, Novolog 0-9 units TID with meals, Novolog 0-5 units QHS  Inpatient Diabetes Program Recommendations: HgbA1C: A1C 9.5% on 08/05/17 indicating an average glucose of 226 mg/dl over the past 2-3 months. Prior A1C was 9.2% on 06/29/17 and Diabetes Coordinator spoke with patient on 06/29/17 during prior hospitalization.   NOTE: In reviewing chart noted patient was hospitalized from 06/28/17 to 07/11/17 and initially patient had not been taking DM medications and was discharged on 07/11/17 to [facility] facility on Metformin 500 mg BID. Noted patient was started on Tradjenta 5 mg daily at [facility] on 07/30/17 and then Amaryl 2 mg daily added on 08/05/17 but patient had not started yet.    Thanks, Orlando Penner, RN, MSN, CDE Diabetes Coordinator Inpatient Diabetes Program [phone] (Team Pager from 8am to 5pm)

## 2017-08-06 NOTE — Anesthesia Post-op Follow-up Note (Signed)
Anesthesia QCDR form completed.        

## 2017-08-06 NOTE — Op Note (Signed)
Surgery cancelled.  Pt taken to ICU

## 2017-08-06 NOTE — Progress Notes (Signed)
Patient ID: Miguel Harvey, male   DOB: 07/09/1945, 72 y.o.   MRN: [id number] Pulmonary/critical care  Procedure note Central line placement Emergently performed Indication: Patient hypotensive without central access Performed under ultrasound guidance Right femoral location chosen team members working at the neck Right femoral vein visualized under direct ultrasound Complete contact barrier precautions utilized Under ultrasound guidance the right femoral vein was accessed without difficulty The guidewire was placed and the needle was removed Using the Seldinger technique a triple-lumen catheter was placed without difficulty All 3 ports had dark nonpulsatile venous return Sutured into place Flushed ports Dressing by nurse Norepinephrine move from peripheral site to central line  Tora Kindred, D.O.

## 2017-08-06 NOTE — Progress Notes (Signed)
Patient opening eyes but does not respond to voice.  Withdraws from pain.  Pupils do not react to light.  This RN discussed with Dr. Lonn Georgia at bedside that since patient has been waking up his blood pressure has increased running 165/109 not on sedation.  Dr. Lonn Georgia stated to continue to monitor patient's alertness and blood pressure and that he did not want to give anything for the elevated blood pressure at this time.  Report given to [facility], RN who is now taking over patient's care and Charli, RN aware of all mentioned above.

## 2017-08-06 NOTE — Progress Notes (Signed)
*  PRELIMINARY RESULTS* Echocardiogram 2D Echocardiogram has been performed.  Cristela Blue 08/06/2017, 3:26 PM

## 2017-08-06 NOTE — Transfer of Care (Signed)
Immediate Anesthesia Transfer of Care Note  Patient: Miguel Harvey  Procedure(s) Performed: CANCELLED PROCEDURE  Patient Location: ICU  Anesthesia Type:General  Level of Consciousness: unresponsive  Airway & Oxygen Therapy: Patient remains intubated per anesthesia plan  Post-op Assessment: Report given to RN  Post vital signs: unstable  Last Vitals:  Vitals Value Taken Time  BP 87/70 08/06/2017 11:36 AM  Temp    Pulse    Resp 16 08/06/2017 11:38 AM  SpO2    Vitals shown include unvalidated device data.  Last Pain:  Vitals:   08/06/17 0948  TempSrc:   PainSc: 0-No pain         Complications: cardiovascular complications

## 2017-08-06 NOTE — Progress Notes (Signed)
   08/06/17 1055  Clinical Encounter Type  Visited With Patient not available;Health care provider  Visit Type Code  Spiritual Encounters  Spiritual Needs Prayer   Chaplain responded to code blue in OR3.  Maintained pastoral presence and offered silent prayer for patient and care team.  Chaplain Yarbrough checked for family; none present.  This chaplain checked in with desk staff after code was called.  Chaplain to continue to monitor patient and asked staff to contact chaplain as needed. 

## 2017-08-06 NOTE — Progress Notes (Addendum)
Pt scheduled for surgery. Pt anesthetized and foot being prepped bp dropped.  Pt placed in trendelenburg postion.  No pulse found and CPR started and code called. Code ran by anesthesia and pulse returned.   Pt taken to ICU.  Spoke to Daughter Duwayne Heck) and updated her of pts current status.

## 2017-08-06 NOTE — Consult Note (Signed)
Cardiology Consultation:   Patient ID: Miguel Harvey; [id number]; 1946-01-07   Admit date: 08/05/2017 Date of Consult: 08/06/2017  Primary Care Provider: Patient, No Pcp Per Primary Cardiologist: NEW TO [facility] Physician requesting consult: CHEN Reason for consult: cardiac arrest  Patient Profile:   Miguel Harvey is a 72 y.o. male with a hx of coronary artery disease, cardiomyopathy ejection fraction 25% on echocardiogram, smoker hyperlipidemia, diabetes, hypertension, presenting to the hospital with diabetic foot ulcer/infection, Has prior below-knee amputation right lower extremity one month ago in [place] Long-standing ulceration left foot with no healing Does not ambulate much at baseline Started on Zosyn and vancomycin, Underwent debridement I&D with Dr. Ether Griffins  He was being prepped for surgery this morning , prior to induction Blood pressure dropped, no pulse appreciated Was given vasopressin 2 units and followed by atropine and CPR Code run by anesthesia and pulse returned  Blood pressure up to 230/160 tachycardia 120 Started on epi infusion transferred to ICU Did not appear to be on telemetry during the prep or at least no rhythm strips available  EKG following the event appeared normal sinus tachycardia 106 bpm no significant ST or T wave changes   History of Present Illness:   Review of notes indicates recent hospital admission April 2019,  n 06/28/2017 was found crawling on his floor at home with generalized weakness, History of falls, has history of peripheral neuropathy  Review of prior echocardiograms  Echocardiogram 07/03/2017 ejection fraction 25-30% (does not appear to have workup done in the hospitalafter drop in ejection fraction, felt secondary to sepsis) Echocardiogram 06/30/2017 ejection fraction 40% severely elevated right heart pressures  echocardiogram February 2013 ejection fraction 40-45%  Cardiac catheterization 05/11/2011  diagnosed with  nonischemic cardiopathy ejection fraction 25% Minimal coronary disease at that time 10-20% ostial LAD   Past Medical History:  Diagnosis Date  . CHF (congestive heart failure) (HCC)   . Diabetes mellitus   . Hypertension     Past Surgical History:  Procedure Laterality Date  . AMPUTATION Right 07/02/2017   Procedure: RIGHT BELOW KNEE AMPUTATION;  Surgeon: Nadara Mustard, MD;  Location: [facility] OR;  Service: Orthopedics;  Laterality: Right;  . cyst removal from  right leg about 6 years ago    . LEFT HEART CATHETERIZATION WITH CORONARY ANGIOGRAM N/A 05/11/2011   Procedure: LEFT HEART CATHETERIZATION WITH CORONARY ANGIOGRAM;  Surgeon: Lennette Bihari, MD;  Location: [facility] CATH LAB;  Service: Cardiovascular;  Laterality: N/A;     Home Medications:  Prior to Admission medications   Medication Sig Start Date End Date Taking? Authorizing Provider  albuterol (ACCUNEB) 0.63 MG/3ML nebulizer solution Take 1 ampule by nebulization every 3 (three) hours as needed for wheezing.   Yes [provider]  amoxicillin-clavulanate (AUGMENTIN) 875-125 MG tablet Take 1 tablet by mouth 2 (two) times daily. 07/31/17 08/07/17 Yes [provider]  atorvastatin (LIPITOR) 40 MG tablet Take 40 mg by mouth every evening.   Yes [provider]  carvedilol (COREG) 6.25 MG tablet Take 6.25 mg by mouth 2 (two) times daily with a meal.   Yes [provider]  furosemide (LASIX) 20 MG tablet Take 20 mg by mouth daily.   Yes [provider]  guaiFENesin-dextromethorphan (ROBITUSSIN DM) 100-10 MG/5ML syrup Take 10 mLs by mouth every 4 (four) hours as needed for cough.   Yes [provider]  hydrochlorothiazide (HYDRODIURIL) 12.5 MG tablet Take 12.5 mg by mouth daily.   Yes [provider]  linagliptin (TRADJENTA)  5 MG TABS tablet Take 5 mg by mouth daily.   Yes [provider]  lisinopril (PRINIVIL,ZESTRIL) 10 MG tablet Take 1 tablet (10 mg total) by mouth  daily. Patient taking differently: Take 40 mg by mouth daily.  07/12/17  Yes Penny Pia, MD  metFORMIN (GLUCOPHAGE) 500 MG tablet Take 500 mg by mouth 2 (two) times daily with a meal.   Yes [provider]  Multiple Vitamin (MULTIVITAMIN WITH MINERALS) TABS tablet Take 1 tablet by mouth daily.   Yes [provider]  potassium chloride SA (K-DUR,KLOR-CON) 20 MEQ tablet Take 40 mEq by mouth 3 (three) times daily.   Yes [provider]  tamsulosin (FLOMAX) 0.4 MG CAPS capsule Take 1 capsule (0.4 mg total) by mouth daily. 07/12/17  Yes Penny Pia, MD  glimepiride (AMARYL) 2 MG tablet Take 2 mg by mouth daily.    [provider]    Inpatient Medications: Scheduled Meds: . atorvastatin  40 mg Oral QPM  . carvedilol  6.25 mg Oral BID WC  . enoxaparin (LOVENOX) injection  40 mg Subcutaneous Q24H  . insulin aspart  0-5 Units Subcutaneous QHS  . insulin aspart  0-9 Units Subcutaneous TID WC  . linagliptin  5 mg Oral Daily  . tamsulosin  0.4 mg Oral Daily   Continuous Infusions: . sodium chloride 75 mL/hr at 08/05/17 1518  . piperacillin-tazobactam (ZOSYN)  IV Stopped (08/06/17 9604)  . vancomycin 1,500 mg (08/06/17 1007)   PRN Meds: acetaminophen **OR** acetaminophen, albuterol, bisacodyl, fentaNYL (SUBLIMAZE) injection, gadobenate dimeglumine, guaiFENesin-dextromethorphan, HYDROcodone-acetaminophen, meperidine (DEMEROL) injection, ondansetron **OR** ondansetron (ZOFRAN) IV, oxyCODONE **OR** oxyCODONE, promethazine, senna-docusate  Allergies:   No Known Allergies  Social History:   Social History   Socioeconomic History  . Marital status: Single    Spouse name: Not on file  . Number of children: Not on file  . Years of education: Not on file  . Highest education level: Not on file  Occupational History  . Not on file  Social Needs  . Financial resource strain: Not on file  . Food insecurity:    Worry: Not on file    Inability: Not on file  .  Transportation needs:    Medical: Not on file    Non-medical: Not on file  Tobacco Use  . Smoking status: Current Every Day Smoker    Packs/day: 2.00    Years: 40.00    Pack years: 80.00    Types: Cigarettes  . Smokeless tobacco: Former Neurosurgeon    Quit date: 05/07/2011  Substance and Sexual Activity  . Alcohol use: Yes    Alcohol/week: 3.6 oz    Types: 6 Cans of beer per week  . Drug use: Not on file  . Sexual activity: Not on file  Lifestyle  . Physical activity:    Days per week: 0 days    Minutes per session: 0 min  . Stress: Not at all  Relationships  . Social connections:    Talks on phone: Not on file    Gets together: Not on file    Attends religious service: Not on file    Active member of club or organization: Not on file    Attends meetings of clubs or organizations: Not on file    Relationship status: Not on file  . Intimate partner violence:    Fear of current or ex partner: Not on file    Emotionally abused: Not on file    Physically abused: Not on file  Forced sexual activity: Not on file  Other Topics Concern  . Not on file  Social History Narrative  . Not on file    Family History:    Family History  Problem Relation Age of Onset  . CAD Other   . Diabetes Other      ROS:  Please see the history of present illness.  Review of Systems  Unable to perform ROS: Intubated     Physical Exam/Data:   Vitals:   08/05/17 1639 08/05/17 2319 08/06/17 0745 08/06/17 0946  BP: 129/88 114/85 134/82 134/87  Pulse: 82 91 73 71  Resp: 18 17 17 17   Temp: 97.8 F (36.6 C) 98.7 F (37.1 C) 98.3 F (36.8 C) (!) 97.1 F (36.2 C)  TempSrc: Oral Oral Oral Tympanic  SpO2: 99% 98% 100% 97%  Weight:      Height:        Intake/Output Summary (Last 24 hours) at 08/06/2017 1138 Last data filed at 08/06/2017 5701 Gross per 24 hour  Intake 2410 ml  Output 0 ml  Net 2410 ml   Filed Weights   08/05/17 1145  Weight: 201 lb 4.5 oz (91.3 kg)   Body mass index is  29.72 kg/m.  General:  Well nourished, well developed, intubated HEENT: normal Lymph: no adenopathy Neck: Unable to estimate JVD Endocrine:  No thryomegaly Vascular: No carotid bruits; FA pulses 2+ bilaterally without bruits  Cardiac:  normal S1, S2; RRR; no murmur tachycardic Lungs: Coarse breath sounds bilaterally, intubated Abd: soft, nondistended no hepatomegaly  Ext: no edema Musculoskeletal:  No deformities,  With  amputation right lower extremity Skin: warm and dry  Neuro: Unable to test Psych: Sedated  EKG:  The EKG was personally reviewed and demonstrates: EKG reviewed from following the event today showing sinus tachycardia rate 106 bpm no significant ST-T wave changes  Telemetry:  Telemetry was personally reviewed and demonstrates: Sinus tachycardia  Relevant CV Studies:  Echocardiogram performed 1 month ago with ejection fraction 25%  Laboratory Data:  Chemistry Recent Labs  Lab 07/30/17 1200 08/05/17 1152 08/06/17 0506  NA 137 137 137  K 3.4* 4.1 4.1  CL 99* 100* 101  CO2 26 29 30   GLUCOSE 219* 233* 137*  BUN 21* 15 12  CREATININE 1.01 0.85 0.80  CALCIUM 8.3* 8.3* 8.1*  GFRNONAA >60 >60 >60  GFRAA >60 >60 >60  ANIONGAP 12 8 6     Recent Labs  Lab 07/30/17 1200 08/05/17 1152  PROT 6.9 6.8  ALBUMIN 3.0* 2.4*  AST 36 25  ALT 44 30  ALKPHOS 195* 217*  BILITOT 1.5* 0.8   Hematology Recent Labs  Lab 07/30/17 1200 08/05/17 1158 08/06/17 0506  WBC 13.0* 10.0 8.2  RBC 3.84* 3.72* 3.44*  HGB 10.7* 10.8* 9.7*  HCT 33.0* 31.7* 28.9*  MCV 85.9 85.1 84.1  MCH 27.8 29.0 28.2  MCHC 32.3 34.0 33.5  RDW 15.5* 16.0* 16.0*  PLT 244 230 215   Cardiac EnzymesNo results for input(s): TROPONINI in the last 168 hours. No results for input(s): TROPIPOC in the last 168 hours.  BNP Recent Labs  Lab 07/30/17 1200  BNP 1,627.0*    DDimer No results for input(s): DDIMER in the last 168 hours.  Radiology/Studies:  Mr Foot Left Wo Contrast  Result Date:  08/06/2017 CLINICAL DATA:  Left fifth toe osteomyelitis. EXAM: MRI OF THE LEFT FOOT WITHOUT CONTRAST TECHNIQUE: Multiplanar, multisequence MR imaging of the left forefoot was performed. No intravenous contrast was administered. COMPARISON:  Left foot x-rays from yesterday. FINDINGS: Bones/Joint/Cartilage Increased marrow edema with osseous destruction and decreased T1 marrow signal involving the fifth metatarsal head, consistent with osteomyelitis. Near complete osseous destruction of the little toe phalanges. There is a angulated, pathologic fracture through the neck of the fifth metatarsal. Bone marrow edema extends throughout the fifth metatarsal shaft with preserved T1 marrow signal, likely reactive. There is also abnormal marrow edema involving the fourth proximal phalanx. Although the T1 marrow signal is relatively preserved, there is probable early cortical destruction along the lateral base of the fourth proximal phalanx. Ligaments The fifth MTP collateral ligaments are likely eroded. Remaining toe collateral ligaments are intact. Lisfranc ligament is intact. Muscles and Tendons Flexor, peroneal and extensor compartment tendons are intact. No tenosynovitis. Increased T2 signal and atrophy within the intrinsic muscles of the forefoot, nonspecific, but likely related to diabetic muscle changes. Soft tissue There are multiple fluid collections in the plantar and dorsal soft tissues of the lateral forefoot. The dorsal fluid collection overlying the fourth and fifth distal metatarsals measures up to 6.9 cm in AP dimension. Smaller 2.4 cm fluid collection along the lateral aspect of the fifth metatarsal head. Slightly more ill-defined 5.7 cm fluid collection in the plantar soft tissues involving the plantar aponeurosis. Ill-defined fluid extends into the plantar aspect of the second webspace and base of the second and third toes. IMPRESSION: 1. Osteomyelitis of the little toe and fifth metatarsal head, as well as  the base of the fourth proximal phalanx. 2. Multiple abscesses in the plantar and dorsal soft tissues of the lateral forefoot as described above. Electronically Signed   By: Obie Dredge M.D.   On: 08/06/2017 08:27   Dg Foot Complete Left  Result Date: 08/05/2017 CLINICAL DATA:  LEFT foot infection, prior RIGHT below-knee amputation fourth and fifth toe necrosis with fluctuance at plantar aspect question infection/abscess EXAM: LEFT FOOT - COMPLETE 3+ VIEW COMPARISON:  06/28/2017 FINDINGS: Diffuse osseous demineralization. Progressive bone destruction involving the base of the proximal phalanx of the LEFT fifth toe. New bone destruction involving the head and neck of the LEFT fifth metatarsal consistent with progressive osteomyelitis, involving the fifth MTP joint. Diffuse osteolysis of the phalanges of the LEFT fifth toe. Remain joint spaces preserved. No additional fracture, dislocation or bone destruction. IMPRESSION: Progressive bone destruction involving the proximal phalanx of the LEFT fifth toe with new bone destruction of the head and neck of the LEFT fifth metatarsal consistent with progressive osteomyelitis. Electronically Signed   By: Ulyses Southward M.D.   On: 08/05/2017 15:18    Assessment and Plan:   1. PEA arrest Etiology unclear,  symptoms presented prior to anesthesia induction while patient was being prepped for surgery.  Prior to the event blood pressure stable, per Dr. Imogene Burn and notes indicate he was not symptomatic. Unable to exclude arrhythmia during the prep though no documentation of this Currently with sinus tachycardia blood pressure stable, epi infusion being weaned off -He appears to be mentating well,  though intubated.   Cooling protocol not indicated given quick recovery with minimal CPR (< 2 min) -Echocardiogram pending No plan for cardiac catheterization at this time Prior cardiac catheterization 5 to 6 years ago with known nonischemic heart myopathy no significant  coronary disease  2.  Nonischemic cardiomyopathy Low ejection fraction 25% dating back to 2013, cardiac catheterization at that time nonischemic -Treated with carvedilol lisinopril diuretics Medications have been placed on hold given hypotensive episode -These could be restarted slowly as blood pressure  permits once he is stable  3.  Osteomyelitis left foot Being followed by podiatry Plan was for irrigation and debridement,  unable to exclude amputation Vascular following On broad-spectrum antibiotics No signs of sepsis  4. Diabetes: Hemoglobin A1c 9.5 Management per primary medicine service   Total encounter time more than 110 minutes  Greater than 50% was spent in counseling and coordination of care with the patient   For questions or updates, please contact [facility] Please consult [email or web address] for contact info under Cardiology/STEMI.   Signed, Julien Nordmann, MD  08/06/2017 11:38 AM

## 2017-08-06 NOTE — Progress Notes (Addendum)
[employer] - [place] [facility]   PATIENT NAME: Miguel Harvey    MR#:  [id number]  DATE OF BIRTH:  July 03, 1945  SUBJECTIVE:  CHIEF COMPLAINT:  No chief complaint on file.  The patient had cardiac arrest in OR. S/p CPR and intubation, on ventilation. BP is low. REVIEW OF SYSTEMS:  Review of Systems  Unable to perform ROS: Intubated    DRUG ALLERGIES:  No Known Allergies VITALS:  Blood pressure 134/87, pulse 71, temperature (!) 97.1 F (36.2 C), temperature source Tympanic, resp. rate 17, height 5\' 9"  (1.753 m), weight 201 lb 4.5 oz (91.3 kg), SpO2 97 %. PHYSICAL EXAMINATION:  Physical Exam  HENT:  Head: Normocephalic.  Eyes: No scleral icterus.  Pupils are 3 mm, not reactive to light.  Neck: Neck supple. No JVD present. No tracheal deviation present.  Cardiovascular: Normal rate, regular rhythm and normal heart sounds. Exam reveals no gallop.  No murmur heard. Pulmonary/Chest: He has no wheezes. He has no rales.  Crackles  Abdominal: Soft. He exhibits distension. There is no tenderness. There is no rebound.  Musculoskeletal: He exhibits no edema or tenderness.  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  Neurological:  Intubated, unable to exam  Skin: No rash noted. No erythema.   LABORATORY PANEL:  Male CBC Recent Labs  Lab 08/06/17 0506  WBC 8.2  HGB 9.7*  HCT 28.9*  PLT 215   ------------------------------------------------------------------------------------------------------------------ Chemistries  Recent Labs  Lab 08/05/17 1152  08/06/17 0506  NA 137  --  137  K 4.1  --  4.1  CL 100*  --  101  CO2 29  --  30  GLUCOSE 233*  --  137*  BUN 15  --  12  CREATININE 0.85  --  0.80  CALCIUM 8.3*  --  8.1*  MG  --    < > 1.6*  AST 25  --   --   ALT 30  --   --   ALKPHOS 217*  --   --   BILITOT 0.8  --   --    < > = values in this interval not displayed.   RADIOLOGY:  Mr Foot Left Wo Contrast  Result Date: 08/06/2017 CLINICAL DATA:   Left fifth toe osteomyelitis. EXAM: MRI OF THE LEFT FOOT WITHOUT CONTRAST TECHNIQUE: Multiplanar, multisequence MR imaging of the left forefoot was performed. No intravenous contrast was administered. COMPARISON:  Left foot x-rays from yesterday. FINDINGS: Bones/Joint/Cartilage Increased marrow edema with osseous destruction and decreased T1 marrow signal involving the fifth metatarsal head, consistent with osteomyelitis. Near complete osseous destruction of the little toe phalanges. There is a angulated, pathologic fracture through the neck of the fifth metatarsal. Bone marrow edema extends throughout the fifth metatarsal shaft with preserved T1 marrow signal, likely reactive. There is also abnormal marrow edema involving the fourth proximal phalanx. Although the T1 marrow signal is relatively preserved, there is probable early cortical destruction along the lateral base of the fourth proximal phalanx. Ligaments The fifth MTP collateral ligaments are likely eroded. Remaining toe collateral ligaments are intact. Lisfranc ligament is intact. Muscles and Tendons Flexor, peroneal and extensor compartment tendons are intact. No tenosynovitis. Increased T2 signal and atrophy within the intrinsic muscles of the forefoot, nonspecific, but likely related to diabetic muscle changes. Soft tissue There are multiple fluid collections in the plantar and dorsal soft tissues of the lateral forefoot. The dorsal fluid collection overlying the fourth and fifth distal metatarsals measures up to 6.9  cm in AP dimension. Smaller 2.4 cm fluid collection along the lateral aspect of the fifth metatarsal head. Slightly more ill-defined 5.7 cm fluid collection in the plantar soft tissues involving the plantar aponeurosis. Ill-defined fluid extends into the plantar aspect of the second webspace and base of the second and third toes. IMPRESSION: 1. Osteomyelitis of the little toe and fifth metatarsal head, as well as the base of the fourth  proximal phalanx. 2. Multiple abscesses in the plantar and dorsal soft tissues of the lateral forefoot as described above. Electronically Signed   By: Obie Dredge M.D.   On: 08/06/2017 08:27   Dg Foot Complete Left  Result Date: 08/05/2017 CLINICAL DATA:  LEFT foot infection, prior RIGHT below-knee amputation fourth and fifth toe necrosis with fluctuance at plantar aspect question infection/abscess EXAM: LEFT FOOT - COMPLETE 3+ VIEW COMPARISON:  06/28/2017 FINDINGS: Diffuse osseous demineralization. Progressive bone destruction involving the base of the proximal phalanx of the LEFT fifth toe. New bone destruction involving the head and neck of the LEFT fifth metatarsal consistent with progressive osteomyelitis, involving the fifth MTP joint. Diffuse osteolysis of the phalanges of the LEFT fifth toe. Remain joint spaces preserved. No additional fracture, dislocation or bone destruction. IMPRESSION: Progressive bone destruction involving the proximal phalanx of the LEFT fifth toe with new bone destruction of the head and neck of the LEFT fifth metatarsal consistent with progressive osteomyelitis. Electronically Signed   By: Ulyses Southward M.D.   On: 08/05/2017 15:18   ASSESSMENT AND PLAN:  The patient was admitted for left foot infection.  Cardiac arrest and respiratory failure. S/p CPR and intubation.  Continue ventilation and CXR. Follow up Dr. Lonn Georgia. Cardiology consult.  Hypotension. BP was low side, now 117/85. Left foot infection with osteomyelitis. Started Zosyn and vancomycin pharmacy to dose. I&D planned today.   PAD. F/u Vascular surgery consult.  Hypertension.  Hold Coreg, Lasix, HCTZ and lisinopril due to hypotension. Hypomagnesemia.  Given IV magnesium and follow-up level. Diabetes.  Continue sliding scale.  Hold metformin. History of chronic systolic CHF. Echo last month 25-30%.  Anemia of chronic disease. Stable Hb. Discussed with Dr. Ether Griffins, Dr. Lonn Georgia and Dr.  Mariah Milling. Waiting for her daughter coming. All the records are reviewed and case discussed with Care Management/Social Worker. Management plans discussed with the patient, family and they are in agreement.  CODE STATUS: Full Code  TOTAL TIME TAKING CARE OF THIS PATIENT: 56 minutes.  More than 50% of the time was spent in counseling/coordination of care: YES  POSSIBLE D/C IN ? DAYS, DEPENDING ON CLINICAL CONDITION.   Shaune Pollack M.D on 08/06/2017 at 11:39 AM  Between 7am to 6pm - Pager - [phone]  After 6pm go to [email or web address] - Therapist, nutritional Hospitalists

## 2017-08-06 NOTE — Consult Note (Signed)
Pharmacy Antibiotic Note  Miguel Harvey is a 72 y.o. male admitted on 08/05/2017 with Left foot infection. According to podiatry note, patient has PVD with necrotic tissue left forefoot with abscess. Patient is S/P BKA on right side and at high risk for BKA on left side . Pharmacy has been consulted for Zosyn and Vancomycin dosing.  Plan: Vancomycin 1500mg  IV Q12hr for goal trough of 15-20. Will obtain trough prior to 4th dose.   Zosyn 3.375 IV EI every 8 hours.   Height: 5\' 9"  (175.3 cm) Weight: 201 lb 4.5 oz (91.3 kg) IBW/kg (Calculated) : 70.7  Temp (24hrs), Avg:97.7 F (36.5 C), Min:97.1 F (36.2 C), Max:98.7 F (37.1 C)  Recent Labs  Lab 08/05/17 1152 08/05/17 1158 08/06/17 0506 08/06/17 1206  WBC  --  10.0 8.2 8.6  CREATININE 0.85  --  0.80 0.83    Estimated Creatinine Clearance: 89.8 mL/min (by C-G formula based on SCr of 0.83 mg/dL).    No Known Allergies  Antimicrobials this admission: 5/9 Zosyn >>  5/9 Vancomycin >>   Dose adjustments this admission: N/A  Microbiology results: 5/9 MRSA PCR: negative   Thank you for allowing pharmacy to be a part of this patient's care.  Miguel Harvey, 08/06/2017 9:09 PM

## 2017-08-06 NOTE — Progress Notes (Signed)
Echocardiogram reviewed Severely depressed LV function ejection fraction 20-25% Severely elevated right heart pressures  Currently intubated --would consider starting Lasix IV At least daily  If renal function stable, consider starting low-dose ACE inhibitor lisinopril 5 via NG tube --If no significant urine output in the next 24 -48 hours Consider starting milrinone infusion low-dose Coreg could be started a later date  Signed, Dossie Arbour, MD, Ph.D [facility]

## 2017-08-06 NOTE — Progress Notes (Signed)
Lab called regarding pt's troponin level being 0.03. Dr. Lonn Georgia notified. No new orders received. Will continue to monitor.

## 2017-08-06 NOTE — Progress Notes (Signed)
RN spoke with Dr. Lonn Georgia and made MD aware that patient is in sinus rhythm but is having increased multifocal PVCs.  Dr. Lonn Georgia gave order for EKG.

## 2017-08-06 NOTE — Anesthesia Procedure Notes (Signed)
Procedure Name: Intubation Date/Time: 08/06/2017 10:57 AM Performed by: Carron Curie, CRNA Pre-anesthesia Checklist: Patient identified, Patient being monitored, Timeout performed, Emergency Drugs available and Suction available Patient Re-evaluated:Patient Re-evaluated prior to induction Oxygen Delivery Method: Circle system utilized Preoxygenation: Pre-oxygenation with 100% oxygen Induction Type: IV induction Ventilation: Mask ventilation without difficulty Laryngoscope Size: Mac and 3 Grade View: Grade I Tube type: Oral Tube size: 7.5 mm Number of attempts: 1 Airway Equipment and Method: Stylet Placement Confirmation: ETT inserted through vocal cords under direct vision,  positive ETCO2 and breath sounds checked- equal and bilateral Secured at: 21 cm Tube secured with: Tape Dental Injury: Teeth and Oropharynx as per pre-operative assessment

## 2017-08-06 NOTE — Progress Notes (Signed)
   08/06/17 1135  Clinical Encounter Type  Visited With Patient;Health care provider  Visit Type Follow-up  Spiritual Encounters  Spiritual Needs Prayer   Received call from OR staff that patient was moving to ICU.  Chaplain checked in with staff regarding possible family presence; none reported.  Chaplain offered silent prayers for patient and care team.

## 2017-08-06 NOTE — Consult Note (Addendum)
Reason for Consult:S/P Cardiac Arrest Referring Physician: Dr. Clarita Leber is an 72 y.o. male.  HPI: Mr. Janik is a 72 year old gentleman with a past medical history remarkable for hypertension, congestive heart failure, nonischemic cardiomyopathy with prior ejection fraction of 20-25%, diabetes mellitus with peripheral vascular disease, status post right BKA, 3 weeks prior. He was in rehabilitation since that time he also has a left foot infection for several days and was admitted for debridement. Unfortunately suffered a cardiac arrest prior to the procedure and was transferred to the intensive care unit.(etiology patient had hypotension which degenerated into a PEA arrest. We see their notes regarding medications given. CPR was performed per anesthesiologist for 2-3 minutes with return of spontaneous circulation. Patient severed one other brief episode of PEA prior to transfer which responded immediately. Upon arrival to the intensive care unit she is intubated, hypotensive with systolic pressures in the 80s, cardiac rhythm is sinus mechanism.  Past Medical History:  Diagnosis Date  . CHF (congestive heart failure) ([place])   . Diabetes mellitus   . Hypertension     Past Surgical History:  Procedure Laterality Date  . AMPUTATION Right 07/02/2017   Procedure: RIGHT BELOW KNEE AMPUTATION;  Surgeon: Newt Minion, MD;  Location: [facility];  Service: Orthopedics;  Laterality: Right;  . cyst removal from  right leg about 6 years ago    . LEFT HEART CATHETERIZATION WITH CORONARY ANGIOGRAM N/A 05/11/2011   Procedure: LEFT HEART CATHETERIZATION WITH CORONARY ANGIOGRAM;  Surgeon: Troy Sine, MD;  Location: [facility] CATH LAB;  Service: Cardiovascular;  Laterality: N/A;    Family History  Problem Relation Age of Onset  . CAD Other   . Diabetes Other     Social History:  reports that he has been smoking cigarettes.  He has a 80.00 pack-year smoking history. He quit smokeless tobacco use about 6  years ago. He reports that he drinks about 3.6 oz of alcohol per week. His drug history is not on file.  Allergies: No Known Allergies  Medications: I have reviewed the patient's current medications.  Results for orders placed or performed during the hospital encounter of 08/05/17 (from the past 48 hour(s))  Glucose, capillary     Status: Abnormal   Collection Time: 08/05/17 11:30 AM  Result Value Ref Range   Glucose-Capillary 215 (H) 65 - 99 mg/dL  Comprehensive metabolic panel     Status: Abnormal   Collection Time: 08/05/17 11:52 AM  Result Value Ref Range   Sodium 137 135 - 145 mmol/L   Potassium 4.1 3.5 - 5.1 mmol/L   Chloride 100 (L) 101 - 111 mmol/L   CO2 29 22 - 32 mmol/L   Glucose, Bld 233 (H) 65 - 99 mg/dL   BUN 15 6 - 20 mg/dL   Creatinine, Ser 0.85 0.61 - 1.24 mg/dL   Calcium 8.3 (L) 8.9 - 10.3 mg/dL   Total Protein 6.8 6.5 - 8.1 g/dL   Albumin 2.4 (L) 3.5 - 5.0 g/dL   AST 25 15 - 41 U/L   ALT 30 17 - 63 U/L   Alkaline Phosphatase 217 (H) 38 - 126 U/L   Total Bilirubin 0.8 0.3 - 1.2 mg/dL   GFR calc non Af Amer >60 >60 mL/min   GFR calc Af Amer >60 >60 mL/min    Comment: (NOTE) The eGFR has been calculated using the CKD EPI equation. This calculation has not been validated in all clinical situations. eGFR's persistently <60 mL/min signify  possible Chronic Kidney Disease.    Anion gap 8 5 - 15    Comment: Performed at [facility], [address] [place], [place] [address]  Magnesium     Status: Abnormal   Collection Time: 08/05/17 11:58 AM  Result Value Ref Range   Magnesium 1.4 (L) 1.7 - 2.4 mg/dL    Comment: Performed at [facility], [address] [place], [place] [address]  CBC WITH DIFFERENTIAL     Status: Abnormal   Collection Time: 08/05/17 11:58 AM  Result Value Ref Range   WBC 10.0 3.8 - 10.6 K/uL   RBC 3.72 (L) 4.40 - 5.90 MIL/uL   Hemoglobin 10.8 (L) 13.0 - 18.0 g/dL   HCT 31.7 (L) 40.0 - 52.0 %   MCV 85.1 80.0 - 100.0 fL    MCH 29.0 26.0 - 34.0 pg   MCHC 34.0 32.0 - 36.0 g/dL   RDW 16.0 (H) 11.5 - 14.5 %   Platelets 230 150 - 440 K/uL   Neutrophils Relative % 80 %   Neutro Abs 8.0 (H) 1.4 - 6.5 K/uL   Lymphocytes Relative 13 %   Lymphs Abs 1.3 1.0 - 3.6 K/uL   Monocytes Relative 6 %   Monocytes Absolute 0.6 0.2 - 1.0 K/uL   Eosinophils Relative 1 %   Eosinophils Absolute 0.1 0 - 0.7 K/uL   Basophils Relative 0 %   Basophils Absolute 0.0 0 - 0.1 K/uL    Comment: Performed at [facility], [address] [place], [place] [address]  APTT     Status: None   Collection Time: 08/05/17 11:58 AM  Result Value Ref Range   aPTT 30 24 - 36 seconds    Comment: Performed at [facility], [address] [place], [place] [address]  Protime-INR     Status: None   Collection Time: 08/05/17 11:58 AM  Result Value Ref Range   Prothrombin Time 14.4 11.4 - 15.2 seconds   INR 1.13     Comment: Performed at [facility], [address] [place], [place] [address]  Hemoglobin A1c     Status: Abnormal   Collection Time: 08/05/17 11:58 AM  Result Value Ref Range   Hgb A1c MFr Bld 9.5 (H) 4.8 - 5.6 %    Comment: (NOTE) Pre diabetes:          5.7%-6.4% Diabetes:              >6.4% Glycemic control for   <7.0% adults with diabetes    Mean Plasma Glucose 225.95 mg/dL    Comment: Performed at [facility], [address] [place] [address], [place] [address]  Glucose, capillary     Status: Abnormal   Collection Time: 08/05/17  4:39 PM  Result Value Ref Range   Glucose-Capillary 195 (H) 65 - 99 mg/dL  MRSA PCR Screening     Status: None   Collection Time: 08/05/17  5:17 PM  Result Value Ref Range   MRSA by PCR NEGATIVE NEGATIVE    Comment:        The GeneXpert MRSA Assay (FDA approved for NASAL specimens only), is one component of a comprehensive MRSA colonization surveillance program. It is not intended to diagnose MRSA infection nor to guide or monitor treatment for MRSA  infections. Performed at [facility], [address] [place], [place] [address]   Glucose, capillary     Status: Abnormal   Collection Time: 08/05/17 10:18 PM  Result  Value Ref Range   Glucose-Capillary 124 (H) 65 - 99 mg/dL   Comment 1 Notify RN   Basic metabolic panel     Status: Abnormal   Collection Time: 08/06/17  5:06 AM  Result Value Ref Range   Sodium 137 135 - 145 mmol/L   Potassium 4.1 3.5 - 5.1 mmol/L   Chloride 101 101 - 111 mmol/L   CO2 30 22 - 32 mmol/L   Glucose, Bld 137 (H) 65 - 99 mg/dL   BUN 12 6 - 20 mg/dL   Creatinine, Ser 0.80 0.61 - 1.24 mg/dL   Calcium 8.1 (L) 8.9 - 10.3 mg/dL   GFR calc non Af Amer >60 >60 mL/min   GFR calc Af Amer >60 >60 mL/min    Comment: (NOTE) The eGFR has been calculated using the CKD EPI equation. This calculation has not been validated in all clinical situations. eGFR's persistently <60 mL/min signify possible Chronic Kidney Disease.    Anion gap 6 5 - 15    Comment: Performed at [facility], [address] [place], [place] [address]  CBC     Status: Abnormal   Collection Time: 08/06/17  5:06 AM  Result Value Ref Range   WBC 8.2 3.8 - 10.6 K/uL   RBC 3.44 (L) 4.40 - 5.90 MIL/uL   Hemoglobin 9.7 (L) 13.0 - 18.0 g/dL   HCT 28.9 (L) 40.0 - 52.0 %   MCV 84.1 80.0 - 100.0 fL   MCH 28.2 26.0 - 34.0 pg   MCHC 33.5 32.0 - 36.0 g/dL   RDW 16.0 (H) 11.5 - 14.5 %   Platelets 215 150 - 440 K/uL    Comment: Performed at [facility], [address] [place], [place] [address]  Magnesium     Status: Abnormal   Collection Time: 08/06/17  5:06 AM  Result Value Ref Range   Magnesium 1.6 (L) 1.7 - 2.4 mg/dL    Comment: Performed at [facility], [place], [place] [address]  Glucose, capillary     Status: Abnormal   Collection Time: 08/06/17  8:32 AM  Result Value Ref Range   Glucose-Capillary 142 (H) 65 - 99 mg/dL   Comment 1 Notify RN     Mr Foot Left Wo Contrast  Result  Date: 08/06/2017 CLINICAL DATA:  Left fifth toe osteomyelitis. EXAM: MRI OF THE LEFT FOOT WITHOUT CONTRAST TECHNIQUE: Multiplanar, multisequence MR imaging of the left forefoot was performed. No intravenous contrast was administered. COMPARISON:  Left foot x-rays from yesterday. FINDINGS: Bones/Joint/Cartilage Increased marrow edema with osseous destruction and decreased T1 marrow signal involving the fifth metatarsal head, consistent with osteomyelitis. Near complete osseous destruction of the little toe phalanges. There is a angulated, pathologic fracture through the neck of the fifth metatarsal. Bone marrow edema extends throughout the fifth metatarsal shaft with preserved T1 marrow signal, likely reactive. There is also abnormal marrow edema involving the fourth proximal phalanx. Although the T1 marrow signal is relatively preserved, there is probable early cortical destruction along the lateral base of the fourth proximal phalanx. Ligaments The fifth MTP collateral ligaments are likely eroded. Remaining toe collateral ligaments are intact. Lisfranc ligament is intact. Muscles and Tendons Flexor, peroneal and extensor compartment tendons are intact. No tenosynovitis. Increased T2 signal and atrophy within the intrinsic muscles of the forefoot, nonspecific, but likely related to diabetic muscle changes. Soft tissue There are multiple fluid collections in the plantar and dorsal soft tissues of the lateral forefoot. The  dorsal fluid collection overlying the fourth and fifth distal metatarsals measures up to 6.9 cm in AP dimension. Smaller 2.4 cm fluid collection along the lateral aspect of the fifth metatarsal head. Slightly more ill-defined 5.7 cm fluid collection in the plantar soft tissues involving the plantar aponeurosis. Ill-defined fluid extends into the plantar aspect of the second webspace and base of the second and third toes. IMPRESSION: 1. 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 2. Multiple abscesses in the plantar and dorsal soft tissues of the lateral forefoot as described above. Electronically Signed   By: Titus Dubin M.D.   On: 08/06/2017 08:27   Dg Foot Complete Left  Result Date: 08/05/2017 CLINICAL DATA:  LEFT foot infection, prior RIGHT below-knee amputation fourth and fifth toe necrosis with fluctuance at plantar aspect question infection/abscess EXAM: LEFT FOOT - COMPLETE 3+ VIEW COMPARISON:  06/28/2017 FINDINGS: Diffuse osseous demineralization. Progressive bone destruction involving the base of the proximal phalanx of the LEFT fifth toe. New bone destruction involving the head and neck of the LEFT fifth metatarsal consistent with progressive osteomyelitis, involving the fifth MTP joint. Diffuse osteolysis of the phalanges of the LEFT fifth toe. Remain joint spaces preserved. No additional fracture, dislocation or bone destruction. IMPRESSION: Progressive bone destruction involving the proximal phalanx of the LEFT fifth toe with new bone destruction of the head and neck of the LEFT fifth metatarsal consistent with progressive osteomyelitis. Electronically Signed   By: Lavonia Dana M.D.   On: 08/05/2017 15:18    ROS Blood pressure 134/87, pulse 71, temperature (!) 97.1 F (36.2 C), temperature source Tympanic, resp. rate 17, height 5' 9"  (1.753 m), weight 201 lb 4.5 oz (91.3 kg), SpO2 97 %.   Physical Exam   Patient is orally intubated. Unresponsive Vital signs: Systolic blood pressures in the 80s, pulse is in the 90s, sinus mechanism HEENT: No oral lesions appreciated, endotracheal tube in place, trachea is midline, no clear jugular venous distention noted Cardiovascular: Regular rate and rhythm, sinus mechanism noted on monitor Pulmonary: Rhonchi appreciated right greater than left Abdominal: Distended abdomen, hypoactive bowel sounds Extremities: Right-sided BKA, left ischemic foot bandaged and  dressed Neurologic: Patient is starting to move spontaneously, slight inequality of his pupils sluggish to respond, may just be post arrest   Assessment/Plan:  Status post cardiac arrest with return of spontaneous circulation. Please see CPR notes noted above. Unclear precipitating events. EKG does not reveal any acute changes. Patient has a old septal infarct with poor R-wave progression and left axis deviation. Patient with underlying history of nonischemic cardiomyopathy with last measured ejection fraction of 20-25%.Will trend cardiac enzymes along with troponins, cardiology has been consulted, Dr. Rockey Situ is present, echocardiogram is pending.   Severe peripheral arterial disease. Patient on vancomycin and Zosyn, was pending debridement of left foot  Shock. At this point unclear septic versus cardiogenic. Will place central line, start patient on norepinephrine with goal mean arterial pressure of 65  Unequal pupil. Will send for stat CT scan of the head  Anemia. No evidence of active bleeding  Electrolyte abnormality. Hypomagnesemia was replaced this morning, potassium was 4.1. Will place pharmacy consult for electrolyte protocol replacement  We'll send off for laboratory evaluation to include CBC with platelets and differential, complete metabolic panel, patient has had chest x-ray, EKG, cardiac enzymes, ABG is pending, will place on mechanical ventilation protocol, troponin, PT/INR  Critical care time 35 minutes  Alysiah Suppa,Ziare 08/06/2017, 11:14 AM

## 2017-08-06 NOTE — Anesthesia Procedure Notes (Signed)
Procedure Name: LMA Insertion Date/Time: 08/06/2017 10:34 AM Performed by: Rosario Jacks, CRNA Pre-anesthesia Checklist: Patient identified, Patient being monitored, Timeout performed, Emergency Drugs available and Suction available Patient Re-evaluated:Patient Re-evaluated prior to induction Oxygen Delivery Method: Circle system utilized Preoxygenation: Pre-oxygenation with 100% oxygen Induction Type: IV induction Ventilation: Mask ventilation without difficulty LMA: LMA inserted LMA Size: 4.0 Tube type: Oral Number of attempts: 1 Placement Confirmation: positive ETCO2 and breath sounds checked- equal and bilateral Tube secured with: Tape Dental Injury: Teeth and Oropharynx as per pre-operative assessment

## 2017-08-07 DIAGNOSIS — I469 Cardiac arrest, cause unspecified: Secondary | ICD-10-CM

## 2017-08-07 LAB — BASIC METABOLIC PANEL
ANION GAP: 6 (ref 5–15)
BUN: 13 mg/dL (ref 6–20)
CHLORIDE: 106 mmol/L (ref 101–111)
CO2: 26 mmol/L (ref 22–32)
CREATININE: 0.75 mg/dL (ref 0.61–1.24)
Calcium: 7.4 mg/dL — ABNORMAL LOW (ref 8.9–10.3)
GFR calc non Af Amer: >60 mL/min (ref >60)
Glucose, Bld: 101 mg/dL — ABNORMAL HIGH (ref 65–99)
POTASSIUM: 3.7 mmol/L (ref 3.5–5.1)
SODIUM: 138 mmol/L (ref 135–145)

## 2017-08-07 LAB — GLUCOSE, CAPILLARY
GLUCOSE-CAPILLARY: 106 mg/dL — AB (ref 65–99)
GLUCOSE-CAPILLARY: 107 mg/dL — AB (ref 65–99)
GLUCOSE-CAPILLARY: 90 mg/dL (ref 65–99)
GLUCOSE-CAPILLARY: 93 mg/dL (ref 65–99)
GLUCOSE-CAPILLARY: 95 mg/dL (ref 65–99)
Glucose-Capillary: 105 mg/dL — ABNORMAL HIGH (ref 65–99)
Glucose-Capillary: 96 mg/dL (ref 65–99)
Glucose-Capillary: 102 mg/dL — ABNORMAL HIGH (ref 65–99)

## 2017-08-07 LAB — TROPONIN I: TROPONIN I: 0.04 ng/mL — AB (ref <0.03)

## 2017-08-07 LAB — CBC
HCT: 29.1 % — ABNORMAL LOW (ref 40.0–52.0)
HEMOGLOBIN: 9.6 g/dL — AB (ref 13.0–18.0)
MCH: 27.8 pg (ref 26.0–34.0)
MCHC: 33 g/dL (ref 32.0–36.0)
MCV: 84.2 fL (ref 80.0–100.0)
Platelets: 229 10*3/uL (ref 150–440)
RBC: 3.46 MIL/uL — AB (ref 4.40–5.90)
RDW: 15.8 % — ABNORMAL HIGH (ref 11.5–14.5)
WBC: 7.1 10*3/uL (ref 3.8–10.6)

## 2017-08-07 LAB — VANCOMYCIN, TROUGH: VANCOMYCIN TR: 28 ug/mL — AB (ref 15–20)

## 2017-08-07 LAB — MAGNESIUM: MAGNESIUM: 1.7 mg/dL (ref 1.7–2.4)

## 2017-08-07 MED ORDER — ORAL CARE MOUTH RINSE
15.0000 mL | Freq: Two times a day (BID) | OROMUCOSAL | Status: DC
  Administered 2017-08-07 – 2017-08-08 (×2): 15 mL via OROMUCOSAL

## 2017-08-07 MED ORDER — LISINOPRIL 10 MG PO TABS
10.0000 mg | ORAL_TABLET | Freq: Every day | ORAL | Status: DC
  Administered 2017-08-07 – 2017-08-12 (×6): 10 mg via ORAL
  Filled 2017-08-07: qty 1
  Filled 2017-08-07: qty 2
  Filled 2017-08-07 (×3): qty 1
  Filled 2017-08-07: qty 2

## 2017-08-07 MED ORDER — SODIUM CHLORIDE 0.9 % IV SOLN
1500.0000 mg | INTRAVENOUS | Status: DC
  Administered 2017-08-07 – 2017-08-09 (×3): 1500 mg via INTRAVENOUS
  Filled 2017-08-07 (×4): qty 1500

## 2017-08-07 MED ORDER — FUROSEMIDE 10 MG/ML IJ SOLN
40.0000 mg | Freq: Once | INTRAMUSCULAR | Status: AC
  Administered 2017-08-07: 40 mg via INTRAVENOUS
  Filled 2017-08-07: qty 4

## 2017-08-07 NOTE — Progress Notes (Signed)
Extubated without complications to room air 

## 2017-08-07 NOTE — Consult Note (Signed)
Pharmacy Antibiotic Note  Miguel Harvey is a 72 y.o. male admitted on 08/05/2017 with Left foot infection. According to podiatry note, patient has PVD with necrotic tissue left forefoot with abscess. Patient is S/P BKA on right side and at high risk for BKA on left side . Pharmacy has been consulted for Zosyn and Vancomycin dosing.  Plan: 5/10 VT: 28- level is supratherapeutic. NEW Kinetics: Ke: 0.053   VD:63.9   T1/2: 13.08   Will adjust dose to Vancomycin 1500mg  IV Q18hr for goal trough of 15-20. Calculated trough at Css is 15. Trough level ordered prior to 4th dose.   Continue Zosyn 3.375 IV EI every 8 hours.   Height: 5\' 9"  (175.3 cm) Weight: 201 lb 4.5 oz (91.3 kg) IBW/kg (Calculated) : 70.7  Temp (24hrs), Avg:97 F (36.1 C), Min:94.8 F (34.9 C), Max:98.2 F (36.8 C)  Recent Labs  Lab 08/05/17 1152 08/05/17 1158 08/06/17 0506 08/06/17 1206 08/06/17 2159 08/07/17 0431 08/07/17 0925  WBC  --  10.0 8.2 8.6  --  7.1  --   CREATININE 0.85  --  0.80 0.83 0.73 0.75  --   VANCOTROUGH  --   --   --   --   --   --  28*    Estimated Creatinine Clearance: 93.1 mL/min (by C-G formula based on SCr of 0.75 mg/dL).    No Known Allergies  Antimicrobials this admission: 5/9 Zosyn >>  5/9 Vancomycin >>   Dose adjustments this admission: 5/10 Vancomycin 1500 Q12H changed to Q18 hours  Microbiology results: 5/9 MRSA PCR: negative   Thank you for allowing pharmacy to be a part of this patient's care.  Gardner Candle, PharmD, BCPS Clinical Pharmacist 08/07/2017 10:23 AM

## 2017-08-07 NOTE — Progress Notes (Signed)
Follow up - Critical Care Medicine Note  Patient Details:    Miguel Harvey is an 72 y.o. male.Past medical history remarkable for hypertension, congestive heart failure, nonischemic cardiomyopathy with prior ejection fraction of 20-25%, diabetes mellitus with peripheral vascular disease, status post right BKA, 3 weeks prior, status post cardiac arrest prior to left foot debridement.   Lines, Airways, Drains: Airway (Active)  Secured at (cm) 23 cm 08/07/2017  7:00 AM  Measured From Lips 08/07/2017  7:00 AM  Secured Location Center 08/07/2017  7:00 AM  Secured By [employer] 08/07/2017  7:00 AM  Tube Holder Repositioned Yes 08/06/2017  7:39 PM  Cuff Pressure (cm H2O) 28 cm H2O 08/07/2017  7:00 AM  Site Condition Dry 08/07/2017  4:00 AM     CVC Triple Lumen 08/06/17 Right Femoral (Active)  Indication for Insertion or Continuance of Line Administration of hyperosmolar/irritating solutions (i.e. TPN, Vancomycin, etc.) 08/06/2017  8:00 PM  Site Assessment Clean;Dry;Intact 08/06/2017  8:00 PM  Proximal Lumen Status Infusing;Flushed;Blood return noted 08/06/2017  8:00 PM  Medial Lumen Status Infusing;Flushed;Blood return noted 08/06/2017  8:00 PM  Distal Lumen Status Flushed;Blood return noted;In-line blood sampling system in place 08/06/2017  8:00 PM  Dressing Type Transparent;Occlusive 08/06/2017  8:00 PM  Dressing Status Dry;Intact;Antimicrobial disc in place;Old drainage 08/06/2017  8:00 PM  Dressing Intervention Dressing changed;Antimicrobial disc changed 08/06/2017  8:00 PM  Dressing Change Due 08/13/17 08/06/2017  8:00 PM     Negative Pressure Wound Therapy Leg Right (Active)     NG/OG Tube Orogastric 18 Fr. Center mouth Xray  (Active)  External Length of Tube (cm) - (if applicable) 36 cm 08/07/2017  4:00 AM  Site Assessment Clean;Dry;Intact 08/07/2017  4:00 AM  Ongoing Placement Verification No change in cm markings or external length of tube from initial placement;No change in respiratory  status;No acute changes, not attributed to clinical condition;Xray 08/07/2017  4:00 AM  Status Suction-low intermittent 08/07/2017  4:00 AM  Amount of suction 80 mmHg 08/07/2017  4:00 AM  Drainage Appearance Clear 08/07/2017  4:00 AM     Urethral Catheter Madolyn Frieze RN Latex 16 Fr. (Active)  Indication for Insertion or Continuance of Catheter Unstable critical patients (first 24-48 hours) 08/07/2017  4:00 AM  Site Assessment Clean;Intact;Dry 08/07/2017  4:00 AM  Catheter Maintenance Bag below level of bladder;Catheter secured;Drainage bag/tubing not touching floor;Insertion date on drainage bag;No dependent loops;Seal intact;Bag emptied prior to transport 08/07/2017  4:00 AM  Collection Container Standard drainage bag 08/07/2017  4:00 AM  Securement Method Leg strap 08/07/2017  4:00 AM  Output (mL) 175 mL 08/07/2017  6:00 AM    Anti-infectives:  Anti-infectives (From admission, onward)   Start     Dose/Rate Route Frequency Ordered Stop   08/05/17 2200  vancomycin (VANCOCIN) 1,500 mg in sodium chloride 0.9 % 500 mL IVPB     1,500 mg 250 mL/hr over 120 Minutes Intravenous Every 12 hours 08/05/17 1658     08/05/17 1400  piperacillin-tazobactam (ZOSYN) IVPB 3.375 g     3.375 g 12.5 mL/hr over 240 Minutes Intravenous Every 8 hours 08/05/17 1311     08/05/17 1315  vancomycin (VANCOCIN) IVPB 1000 mg/200 mL premix     1,000 mg 200 mL/hr over 60 Minutes Intravenous STAT 08/05/17 1310 08/05/17 1618      Microbiology: Results for orders placed or performed during the hospital encounter of 08/05/17  MRSA PCR Screening     Status: None   Collection Time: 08/05/17  5:17  PM  Result Value Ref Range Status   MRSA by PCR NEGATIVE NEGATIVE Final    Comment:        The GeneXpert MRSA Assay (FDA approved for NASAL specimens only), is one component of a comprehensive MRSA colonization surveillance program. It is not intended to diagnose MRSA infection nor to guide or monitor treatment for MRSA  infections. Performed at [facility], [address] [place], [place] [address]   Aerobic/Anaerobic Culture (surgical/deep wound)     Status: None (Preliminary result)   Collection Time: 08/06/17  2:42 PM  Result Value Ref Range Status   Specimen Description   Final    FOOT LEFT Performed at [facility], [address] [place], [place] [address]    Special Requests   Final    Immunocompromised Performed at [facility], [address] [place], [place] [address]    Gram Stain   Final    FEW WBC PRESENT,BOTH PMN AND MONONUCLEAR FEW GRAM POSITIVE COCCI RARE GRAM POSITIVE RODS Performed at [facility], [address], [place], [place] [address]    Culture PENDING  Incomplete   Report Status PENDING  Incomplete     Studies: Dg Abd 1 View  Result Date: 08/06/2017 CLINICAL DATA:  Orogastric tube placement. EXAM: ABDOMEN - 1 VIEW COMPARISON:  07/02/2017. FINDINGS: Interval mildly dilated, gas-filled loops of colon and small bowel. Orogastric tube tip in the proximal stomach. Thoracolumbar spine degenerative changes and mild scoliosis. IMPRESSION: 1. Orogastric tube tip in the proximal stomach. 2. Interval mild colonic and small bowel ileus or partial obstruction. Electronically Signed   By: Beckie Salts M.D.   On: 08/06/2017 13:52   Ct Head Wo Contrast  Result Date: 08/06/2017 CLINICAL DATA:  Altered level of consciousness. EXAM: CT HEAD WITHOUT CONTRAST TECHNIQUE: Contiguous axial images were obtained from the base of the skull through the vertex without intravenous contrast. COMPARISON:  CT scan of July 02, 2017. FINDINGS: Brain: Mild chronic ischemic white matter disease is noted. No mass effect or midline shift is noted. Ventricular size is within normal limits. There is no evidence of mass lesion, hemorrhage or acute infarction. Vascular: No mass effect or midline shift is noted. Ventricular size is within normal limits. There is no evidence of  mass lesion, hemorrhage or acute infarction. Skull: Normal. Negative for fracture or focal lesion. Sinuses/Orbits: No acute finding. Other: None. IMPRESSION: Mild chronic ischemic white matter disease. No acute intracranial abnormality seen. Electronically Signed   By: Lupita Raider, M.D.   On: 08/06/2017 13:30   Mr Foot Left Wo Contrast  Result Date: 08/06/2017 CLINICAL DATA:  Left fifth toe osteomyelitis. EXAM: MRI OF THE LEFT FOOT WITHOUT CONTRAST TECHNIQUE: Multiplanar, multisequence MR imaging of the left forefoot was performed. No intravenous contrast was administered. COMPARISON:  Left foot x-rays from yesterday. FINDINGS: Bones/Joint/Cartilage Increased marrow edema with osseous destruction and decreased T1 marrow signal involving the fifth metatarsal head, consistent with osteomyelitis. Near complete osseous destruction of the little toe phalanges. There is a angulated, pathologic fracture through the neck of the fifth metatarsal. Bone marrow edema extends throughout the fifth metatarsal shaft with preserved T1 marrow signal, likely reactive. There is also abnormal marrow edema involving the fourth proximal phalanx. Although the T1 marrow signal is relatively preserved, there is probable early cortical destruction along the lateral base of the fourth proximal phalanx. Ligaments The fifth MTP collateral ligaments are likely eroded. Remaining toe collateral ligaments are intact. Lisfranc ligament  is intact. Muscles and Tendons Flexor, peroneal and extensor compartment tendons are intact. No tenosynovitis. Increased T2 signal and atrophy within the intrinsic muscles of the forefoot, nonspecific, but likely related to diabetic muscle changes. Soft tissue There are multiple fluid collections in the plantar and dorsal soft tissues of the lateral forefoot. The dorsal fluid collection overlying the fourth and fifth distal metatarsals measures up to 6.9 cm in AP dimension. Smaller 2.4 cm fluid collection along  the lateral aspect of the fifth metatarsal head. Slightly more ill-defined 5.7 cm fluid collection in the plantar soft tissues involving the plantar aponeurosis. Ill-defined fluid extends into the plantar aspect of the second webspace and base of the second and third toes. IMPRESSION: 1. 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 2. Multiple abscesses in the plantar and dorsal soft tissues of the lateral forefoot as described above. Electronically Signed   By: Obie Dredge M.D.   On: 08/06/2017 08:27   Dg Chest Port 1 View  Result Date: 08/06/2017 CLINICAL DATA:  Intubated.  Smoker. EXAM: PORTABLE CHEST 1 VIEW COMPARISON:  07/02/2017. FINDINGS: Endotracheal tube in satisfactory position. Orogastric tube extending into the stomach. Stable enlarged cardiac silhouette. Decreased prominence of the pulmonary vasculature and interstitial markings. No pleural fluid seen. Mild thoracic spine degenerative changes. IMPRESSION: Stable cardiomegaly with mildly improved mild changes of congestive heart failure. Electronically Signed   By: Beckie Salts M.D.   On: 08/06/2017 13:54   Dg Foot Complete Left  Result Date: 08/05/2017 CLINICAL DATA:  LEFT foot infection, prior RIGHT below-knee amputation fourth and fifth toe necrosis with fluctuance at plantar aspect question infection/abscess EXAM: LEFT FOOT - COMPLETE 3+ VIEW COMPARISON:  06/28/2017 FINDINGS: Diffuse osseous demineralization. Progressive bone destruction involving the base of the proximal phalanx of the LEFT fifth toe. New bone destruction involving the head and neck of the LEFT fifth metatarsal consistent with progressive osteomyelitis, involving the fifth MTP joint. Diffuse osteolysis of the phalanges of the LEFT fifth toe. Remain joint spaces preserved. No additional fracture, dislocation or bone destruction. IMPRESSION: Progressive bone destruction involving the proximal phalanx of the  LEFT fifth toe with new bone destruction of the head and neck of the LEFT fifth metatarsal consistent with progressive osteomyelitis. Electronically Signed   By: Ulyses Southward M.D.   On: 08/05/2017 15:18    Consults: Treatment Team:  Antonieta Iba, MD Schnier, Latina Craver, MD Tora Kindred, DO   Subjective:    Overnight Issues: patient did well overnight, stable hemodynamics, increased mental status, this morning patient was awake alert and communicating wanting to get endotracheal tube removed  Objective:  Vital signs for last 24 hours: Temp:  [94.8 F (34.9 C)-98.2 F (36.8 C)] 98.1 F (36.7 C) (05/11 0800) Pulse Rate:  [58-90] 70 (05/11 0800) Resp:  [14-38] 27 (05/11 0800) BP: (86-181)/(56-129) 122/75 (05/11 0800) SpO2:  [97 %-100 %] 100 % (05/11 0800) FiO2 (%):  [30 %-100 %] 30 % (05/11 0700)  Hemodynamic parameters for last 24 hours:    Intake/Output from previous day: 05/10 0701 - 05/11 0700 In: 3610.9 [I.V.:2360.9; IV Piggyback:1250] Out: 1955 [Urine:1955]  Intake/Output this shift: Total I/O In: 75 [I.V.:75] Out: -   Vent settings for last 24 hours: Vent Mode: PRVC FiO2 (%):  [30 %-100 %] 30 % Set Rate:  [15 bmp] 15 bmp Vt Set:  [500 mL] 500 mL PEEP:  [5 cmH20] 5 cmH20  Physical Exam:  Patient is awake, alert and communicating Vital signs: Please see the above listed vital signs HEENT: Oral endotracheal tube in place, oral gastric tube, trachea midline, no accessory muscle utilization Cardiovascular: Regular rate and rhythm Pulmonary: Clear to auscultation Abdominal: Positive bowel sounds, soft nontender exam Extremities: Left foot is bandaged Neurologic: Patient appears to have had a full recovery postcardiac arrest  Assessment/Plan:   Status post cardiac arrest with return of spontaneous circulation. Patient doing well this morning, awake and alert, will perform spontaneous awakening and breathing trial and consider extubation  Patient with  nonischemic cardiomyopathy and reduced ejection fraction. Has had recent cardiac catheterization without any occlusive disease. Peak troponin post arrest was 0.05  Severe peripheral arterial disease. Patient on vancomycin and Zosyn, was pending debridement of left foot  Shock.Has resolved, stable hemodynamics  Unequal pupil. Negative head CT for acute intracranial abnormality  Anemia. No evidence of active bleeding  Electrolyte abnormality. On protocol for replacement   Critical care time 35 minutes   Clarnce Homan,[place] 08/07/2017  *Care during the described time interval was provided by me and/or other providers on the critical care team.  I have reviewed this patient's available data, including medical history, events of note, physical examination and test results as part of my evaluation.

## 2017-08-07 NOTE — Progress Notes (Signed)
Patient extubated by respiratory per Dr. Lonn Georgia- Pt tolerated well.  Pt on room air sating 100%.  Patient complains of no pain.

## 2017-08-07 NOTE — Anesthesia Postprocedure Evaluation (Signed)
Anesthesia Post Note  Patient: Miguel Harvey  Procedure(s) Performed: CANCELLED PROCEDURE  Patient location during evaluation: SICU Anesthesia Type: General Level of consciousness: awake and alert Pain management: pain level controlled Vital Signs Assessment: post-procedure vital signs reviewed and stable Respiratory status: spontaneous breathing, nonlabored ventilation and respiratory function stable Cardiovascular status: stable Postop Assessment: no apparent nausea or vomiting Anesthetic complications: no Comments: Patient is extubated and off of pressor at this point.  He has no complaints and is awake and alert sitting up in bed.     Last Vitals:  Vitals:   08/07/17 0900 08/07/17 1000  BP: 137/76 139/84  Pulse: 74 75  Resp: 16 13  Temp:    SpO2: 100% 100%    Last Pain:  Vitals:   08/07/17 0800  TempSrc:   PainSc: 0-No pain                 Lenard Simmer

## 2017-08-07 NOTE — Plan of Care (Signed)
Pt. Opening eyes to voice, but no eye contact/ not following commands. Ventilator settings remain the same. Pt. Hypothermic, bear hugger placed, other VSS O/N. UOP adequate. No care concerns at this time. Report given to Charli.

## 2017-08-07 NOTE — Progress Notes (Signed)
Progress Note  Patient Name: Miguel Harvey Date of Encounter: 08/07/2017  Primary Cardiologist: No primary care provider on file.   Subjective   Extubated, reports having left foot pain On broad-spectrum antibiotics Chest congestion, cough  Inpatient Medications    Scheduled Meds: . atorvastatin  40 mg Oral QPM  . carvedilol  6.25 mg Oral BID WC  . enoxaparin (LOVENOX) injection  40 mg Subcutaneous Q24H  . insulin aspart  0-5 Units Subcutaneous QHS  . insulin aspart  0-9 Units Subcutaneous TID WC  . mouth rinse  15 mL Mouth Rinse BID  . sodium chloride flush  10-40 mL Intracatheter Q12H  . tamsulosin  0.4 mg Oral Daily   Continuous Infusions: . famotidine (PEPCID) IV Stopped (08/07/17 1034)  . norepinephrine (LEVOPHED) Adult infusion Stopped (08/06/17 1202)  . piperacillin-tazobactam (ZOSYN)  IV 3.375 g (08/07/17 1410)  . propofol (DIPRIVAN) infusion Stopped (08/07/17 0730)  . vancomycin     PRN Meds: acetaminophen **OR** acetaminophen, albuterol, bisacodyl, fentaNYL (SUBLIMAZE) injection, gadobenate dimeglumine, senna-docusate, sodium chloride flush   Vital Signs    Vitals:   08/07/17 1314 08/07/17 1400 08/07/17 1500 08/07/17 1600  BP:  138/90 (!) 148/83   Pulse: 81 82 79 81  Resp: 12 20 (!) 22 16  Temp: 98.1 F (36.7 C)   98.2 F (36.8 C)  TempSrc: Axillary   Axillary  SpO2: 98% 95% 92% 98%  Weight:      Height:        Intake/Output Summary (Last 24 hours) at 08/07/2017 1604 Last data filed at 08/07/2017 1600 Gross per 24 hour  Intake 3033.42 ml  Output 3805 ml  Net -771.58 ml   Filed Weights   08/05/17 1145  Weight: 201 lb 4.5 oz (91.3 kg)    Telemetry    Normal sinus rhythm - Personally Reviewed  ECG     - Personally Reviewed  Physical Exam   GEN:  Supine in bed, cough, reports having significant foot pain Neck: unable to estimate JVD Cardiac: RRR, no murmurs, rubs, or gallops.  Respiratory:moderately decreased breath sounds throughout,  Rales at the bases GI: Soft, nontender, non-distended  MS: No edema; No deformity.  amputation right lower extremity Left foot in bandage, gangrenous toes noted Neuro:  Nonfocal , full exam not performed Psych: Normal affect , lethargic, minimally verbal  Labs    Chemistry Recent Labs  Lab 08/05/17 1152  08/06/17 1206 08/06/17 2159 08/07/17 0431  NA 137   < > 136 138 138  K 4.1   < > 3.9 4.0 3.7  CL 100*   < > 101 104 106  CO2 29   < > 27 27 26   GLUCOSE 233*   < > 166* 124* 101*  BUN 15   < > 13 14 13   CREATININE 0.85   < > 0.83 0.73 0.75  CALCIUM 8.3*   < > 7.8* 7.6* 7.4*  PROT 6.8  --   --   --   --   ALBUMIN 2.4*  --   --   --   --   AST 25  --   --   --   --   ALT 30  --   --   --   --   ALKPHOS 217*  --   --   --   --   BILITOT 0.8  --   --   --   --   GFRNONAA >60   < > >60 >60 >  60  GFRAA >60   < > >60 >60 >60  ANIONGAP 8   < > 8 7 6    < > = values in this interval not displayed.     Hematology Recent Labs  Lab 08/06/17 0506 08/06/17 1206 08/07/17 0431  WBC 8.2 8.6 7.1  RBC 3.44* 3.62* 3.46*  HGB 9.7* 10.0* 9.6*  HCT 28.9* 30.9* 29.1*  MCV 84.1 85.4 84.2  MCH 28.2 27.7 27.8  MCHC 33.5 32.4 33.0  RDW 16.0* 16.0* 15.8*  PLT 215 285 229    Cardiac Enzymes Recent Labs  Lab 08/06/17 1206 08/06/17 1854 08/07/17 0431  TROPONINI 0.03* 0.05* 0.04*   No results for input(s): TROPIPOC in the last 168 hours.   BNPNo results for input(s): BNP, PROBNP in the last 168 hours.   DDimer No results for input(s): DDIMER in the last 168 hours.   Radiology    Dg Abd 1 View  Result Date: 08/06/2017 CLINICAL DATA:  Orogastric tube placement. EXAM: ABDOMEN - 1 VIEW COMPARISON:  07/02/2017. FINDINGS: Interval mildly dilated, gas-filled loops of colon and small bowel. Orogastric tube tip in the proximal stomach. Thoracolumbar spine degenerative changes and mild scoliosis. IMPRESSION: 1. Orogastric tube tip in the proximal stomach. 2. Interval mild colonic and small  bowel ileus or partial obstruction. Electronically Signed   By: Beckie Salts M.D.   On: 08/06/2017 13:52   Ct Head Wo Contrast  Result Date: 08/06/2017 CLINICAL DATA:  Altered level of consciousness. EXAM: CT HEAD WITHOUT CONTRAST TECHNIQUE: Contiguous axial images were obtained from the base of the skull through the vertex without intravenous contrast. COMPARISON:  CT scan of July 02, 2017. FINDINGS: Brain: Mild chronic ischemic white matter disease is noted. No mass effect or midline shift is noted. Ventricular size is within normal limits. There is no evidence of mass lesion, hemorrhage or acute infarction. Vascular: No mass effect or midline shift is noted. Ventricular size is within normal limits. There is no evidence of mass lesion, hemorrhage or acute infarction. Skull: Normal. Negative for fracture or focal lesion. Sinuses/Orbits: No acute finding. Other: None. IMPRESSION: Mild chronic ischemic white matter disease. No acute intracranial abnormality seen. Electronically Signed   By: Lupita Raider, M.D.   On: 08/06/2017 13:30   Mr Foot Left Wo Contrast  Result Date: 08/06/2017 CLINICAL DATA:  Left fifth toe osteomyelitis. EXAM: MRI OF THE LEFT FOOT WITHOUT CONTRAST TECHNIQUE: Multiplanar, multisequence MR imaging of the left forefoot was performed. No intravenous contrast was administered. COMPARISON:  Left foot x-rays from yesterday. FINDINGS: Bones/Joint/Cartilage Increased marrow edema with osseous destruction and decreased T1 marrow signal involving the fifth metatarsal head, consistent with osteomyelitis. Near complete osseous destruction of the little toe phalanges. There is a angulated, pathologic fracture through the neck of the fifth metatarsal. Bone marrow edema extends throughout the fifth metatarsal shaft with preserved T1 marrow signal, likely reactive. There is also abnormal marrow edema involving the fourth proximal phalanx. Although the T1 marrow signal is relatively preserved, there  is probable early cortical destruction along the lateral base of the fourth proximal phalanx. Ligaments The fifth MTP collateral ligaments are likely eroded. Remaining toe collateral ligaments are intact. Lisfranc ligament is intact. Muscles and Tendons Flexor, peroneal and extensor compartment tendons are intact. No tenosynovitis. Increased T2 signal and atrophy within the intrinsic muscles of the forefoot, nonspecific, but likely related to diabetic muscle changes. Soft tissue There are multiple fluid collections in the plantar and dorsal soft tissues of the lateral  forefoot. The dorsal fluid collection overlying the fourth and fifth distal metatarsals measures up to 6.9 cm in AP dimension. Smaller 2.4 cm fluid collection along the lateral aspect of the fifth metatarsal head. Slightly more ill-defined 5.7 cm fluid collection in the plantar soft tissues involving the plantar aponeurosis. Ill-defined fluid extends into the plantar aspect of the second webspace and base of the second and third toes. IMPRESSION: 1. 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 2. Multiple abscesses in the plantar and dorsal soft tissues of the lateral forefoot as described above. Electronically Signed   By: Obie Dredge M.D.   On: 08/06/2017 08:27   Dg Chest Port 1 View  Result Date: 08/06/2017 CLINICAL DATA:  Intubated.  Smoker. EXAM: PORTABLE CHEST 1 VIEW COMPARISON:  07/02/2017. FINDINGS: Endotracheal tube in satisfactory position. Orogastric tube extending into the stomach. Stable enlarged cardiac silhouette. Decreased prominence of the pulmonary vasculature and interstitial markings. No pleural fluid seen. Mild thoracic spine degenerative changes. IMPRESSION: Stable cardiomegaly with mildly improved mild changes of congestive heart failure. Electronically Signed   By: Beckie Salts M.D.   On: 08/06/2017 13:54    Cardiac Studies   Echocardiogram - Left  ventricle: The cavity size was mildly dilated. There was   mild concentric hypertrophy. Systolic function was severely   reduced. The estimated ejection fraction was in the range of 20%   to 25%. Diffuse hypokinesis. Regional wall motion abnormalities   cannot be excluded. The study is not technically sufficient to   allow evaluation of LV diastolic function. - Mitral valve: There was moderate regurgitation. - Left atrium: The atrium was mildly dilated. - Right ventricle: The cavity size was mildly dilated. Wall   thickness was normal. Systolic function was mildly reduced. - Right atrium: The atrium was mildly dilated. - Tricuspid valve: There was moderate regurgitation. - Pulmonary arteries: Systolic pressure was severely elevated. PA   peak pressure: 68 mm Hg (S).  Patient Profile     SIMONE BURRINGTON is a 72 y.o. male with a hx of coronary artery disease, cardiomyopathy ejection fraction 25% on echocardiogram, smoker hyperlipidemia, diabetes, hypertension, presenting to the hospital with diabetic foot ulcer/infection, Has prior below-knee amputation right lower extremity one month ago in [place] Long-standing ulceration left foot with no healing Does not ambulate much at baseline Started on Zosyn and vancomycin, Was being prepped for debridement left foot with Dr. Ether Griffins PEA arrest    Assessment & Plan     1. PEA arrest Etiology unclear,   prior to anesthesia induction while patient was being prepped for surgery.  Unable to exclude arrhythmia during the prep though no documentation   epi infusion weaned off and now extubated No plan for cardiac catheterization  Prior cardiac catheterization 5 to 6 years ago with known nonischemic heart myopathy no significant coronary disease  2.  Nonischemic cardiomyopathy Low ejection fraction 25% dating back to 2013, cardiac catheterization at that time nonischemic Markedly elevated right heart pressures Would recommend we restart  carvedilol lisinopril diuretics Will discontinue IV fluids If no significant urine output with IV Lasix he may benefit from midodrine infusion  3.  Osteomyelitis left foot Being followed by podiatry Plan was for irrigation and debridement,  unable to exclude amputation Vascular following On broad-spectrum antibiotics Pain control  4. Diabetes:  long history of poor control diabetes Hemoglobin A1c 9.5 Management per primary medicine service  Case discussed with intensivist and  nursing  Total encounter time more than 35 minutes  Greater than 50% was spent in counseling and coordination of care with the patient   For questions or updates, please contact [facility] Please consult [email or web address] for contact info under Cardiology/STEMI.      Signed, Julien Nordmann, MD  08/07/2017, 4:04 PM

## 2017-08-07 NOTE — Progress Notes (Signed)
Daily Progress Note   Subjective  - 1 Day Post-Op  F/u cancelled debridment yesterday secondary to arrest with code.   Is extubated today and alert and communicate both verbally and non-verbal.  Some confusion today.  Objective Vitals:   08/07/17 0500 08/07/17 0600 08/07/17 0700 08/07/17 0800  BP: 115/67 124/69 120/70 122/75  Pulse: 63 66 68 70  Resp: 20 19 (!) 22 (!) 27  Temp: (!) 97.3 F (36.3 C) 97.9 F (36.6 C) 98.2 F (36.8 C) 98.1 F (36.7 C)  TempSrc:      SpO2: 100% 100% 97% 100%  Weight:      Height:        Physical Exam: Dressing intact. Wound culture with GPC's and GPR's MRI and xray shows osteomyelitis and multiple abscesses.  Laboratory CBC    Component Value Date/Time   WBC 7.1 08/07/2017 0431   HGB 9.6 (L) 08/07/2017 0431   HCT 29.1 (L) 08/07/2017 0431   PLT 229 08/07/2017 0431    BMET    Component Value Date/Time   NA 138 08/07/2017 0431   K 3.7 08/07/2017 0431   CL 106 08/07/2017 0431   CO2 26 08/07/2017 0431   GLUCOSE 101 (H) 08/07/2017 0431   BUN 13 08/07/2017 0431   CREATININE 0.75 08/07/2017 0431   CREATININE 1.43 (H) 09/21/2012 1150   CALCIUM 7.4 (L) 08/07/2017 0431   GFRNONAA >60 08/07/2017 0431   GFRAA >60 08/07/2017 0431    Assessment/Planning: Abscess with osteomyelitis left foot DM with pvd S/P right BKA   Will still need debridment of left foot and drainage of abscess as suspect pt will become septic if left alone.  Will await further medical stabilization.   Consult ID for osteomyelitis and long term abx.  Will have dressing changed daily to left foot.   Miguel Harvey A  08/07/2017, 8:56 AM

## 2017-08-08 DIAGNOSIS — M869 Osteomyelitis, unspecified: Secondary | ICD-10-CM

## 2017-08-08 DIAGNOSIS — I5043 Acute on chronic combined systolic (congestive) and diastolic (congestive) heart failure: Secondary | ICD-10-CM

## 2017-08-08 DIAGNOSIS — I420 Dilated cardiomyopathy: Secondary | ICD-10-CM

## 2017-08-08 LAB — BASIC METABOLIC PANEL
ANION GAP: 9 (ref 5–15)
BUN: 12 mg/dL (ref 6–20)
CALCIUM: 7.7 mg/dL — AB (ref 8.9–10.3)
CO2: 25 mmol/L (ref 22–32)
Chloride: 106 mmol/L (ref 101–111)
Creatinine, Ser: 0.94 mg/dL (ref 0.61–1.24)
Glucose, Bld: 102 mg/dL — ABNORMAL HIGH (ref 65–99)
Potassium: 3.3 mmol/L — ABNORMAL LOW (ref 3.5–5.1)
Sodium: 140 mmol/L (ref 135–145)

## 2017-08-08 LAB — BLOOD GAS, ARTERIAL
ACID-BASE EXCESS: 0.7 mmol/L (ref 0.0–2.0)
ALLENS TEST (PASS/FAIL): POSITIVE — AB
Bicarbonate: 25.4 mmol/L (ref 20.0–28.0)
FIO2: 60
LHR: 15 {breaths}/min
MECHVT: 500 mL
O2 SAT: 99.7 %
PEEP/CPAP: 5 cmH2O
PO2 ART: 198 mmHg — AB (ref 83.0–108.0)
Patient temperature: 37
pCO2 arterial: 40 mmHg (ref 32.0–48.0)
pH, Arterial: 7.41 (ref 7.350–7.450)

## 2017-08-08 LAB — GLUCOSE, CAPILLARY
GLUCOSE-CAPILLARY: 91 mg/dL (ref 65–99)
GLUCOSE-CAPILLARY: 102 mg/dL — AB (ref 65–99)
GLUCOSE-CAPILLARY: 161 mg/dL — AB (ref 65–99)
GLUCOSE-CAPILLARY: 165 mg/dL — AB (ref 65–99)
Glucose-Capillary: 97 mg/dL (ref 65–99)

## 2017-08-08 LAB — MAGNESIUM: Magnesium: 1.7 mg/dL (ref 1.7–2.4)

## 2017-08-08 LAB — CBC
HEMATOCRIT: 29.6 % — AB (ref 40.0–52.0)
Hemoglobin: 9.9 g/dL — ABNORMAL LOW (ref 13.0–18.0)
MCH: 28.1 pg (ref 26.0–34.0)
MCHC: 33.5 g/dL (ref 32.0–36.0)
MCV: 83.9 fL (ref 80.0–100.0)
PLATELETS: 236 10*3/uL (ref 150–440)
RBC: 3.52 MIL/uL — AB (ref 4.40–5.90)
RDW: 16 % — AB (ref 11.5–14.5)
WBC: 8.4 10*3/uL (ref 3.8–10.6)

## 2017-08-08 LAB — PHOSPHORUS: PHOSPHORUS: 3.4 mg/dL (ref 2.5–4.6)

## 2017-08-08 MED ORDER — MAGNESIUM SULFATE 2 GM/50ML IV SOLN
2.0000 g | Freq: Once | INTRAVENOUS | Status: AC
  Administered 2017-08-08: 2 g via INTRAVENOUS
  Filled 2017-08-08: qty 50

## 2017-08-08 MED ORDER — FUROSEMIDE 10 MG/ML IJ SOLN
40.0000 mg | Freq: Every day | INTRAMUSCULAR | Status: DC
  Administered 2017-08-08 – 2017-08-12 (×5): 40 mg via INTRAVENOUS
  Filled 2017-08-08 (×5): qty 4

## 2017-08-08 MED ORDER — LORAZEPAM 2 MG/ML IJ SOLN
0.5000 mg | INTRAMUSCULAR | Status: DC | PRN
  Administered 2017-08-09 – 2017-08-12 (×5): 0.5 mg via INTRAVENOUS
  Filled 2017-08-08 (×6): qty 1

## 2017-08-08 MED ORDER — POTASSIUM CHLORIDE 10 MEQ/50ML IV SOLN
10.0000 meq | INTRAVENOUS | Status: AC
Start: 2017-08-08 — End: 2017-08-08
  Administered 2017-08-08 (×4): 10 meq via INTRAVENOUS
  Filled 2017-08-08 (×4): qty 50

## 2017-08-08 NOTE — Progress Notes (Signed)
[employer] - [place] [facility]   PATIENT NAME: Miguel Harvey    MR#:  [id number]  DATE OF BIRTH:  1945/06/14  SUBJECTIVE:  CHIEF COMPLAINT:  No chief complaint on file.  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 During pre-op for left Foot procedure had PEA- code, intuabated and in ICU, now extubated this am,  No complains. REVIEW OF SYSTEMS:  Review of Systems  Constitutional: Negative for chills and fever.  HENT: Negative for congestion, ear discharge, hearing loss and tinnitus.   Eyes: Negative for blurred vision, double vision and discharge.  Respiratory: Negative for cough, shortness of breath and wheezing.   Cardiovascular: Negative for chest pain, palpitations and leg swelling.  Gastrointestinal: Negative for abdominal pain, blood in stool, nausea and vomiting.  Genitourinary: Negative for dysuria and urgency.  Musculoskeletal: Negative for back pain.  Skin: Negative for rash.  Neurological: Negative for dizziness, tremors and focal weakness.  Endo/Heme/Allergies: Does not bruise/bleed easily.  Psychiatric/Behavioral: Negative for depression.    DRUG ALLERGIES:  No Known Allergies VITALS:  Blood pressure (!) 127/92, pulse 74, temperature 97.9 F (36.6 C), temperature source Oral, resp. rate 16, height 5\' 9"  (1.753 m), weight 91.3 kg (201 lb 4.5 oz), SpO2 98 %. PHYSICAL EXAMINATION:  Physical Exam  Constitutional: He is oriented to person, place, and time.  HENT:  Head: Normocephalic.  Eyes: Pupils are equal, round, and reactive to light. No scleral icterus.  Neck: Neck supple. No JVD present. No tracheal deviation present.  Cardiovascular: Normal rate, regular rhythm and normal heart sounds. Exam reveals no gallop.  No murmur heard. Pulmonary/Chest: He has no wheezes. He has no rales.  Crackles  Abdominal: Soft. He exhibits no distension. There is no tenderness. There is no rebound.  Musculoskeletal: He exhibits no edema or  tenderness.  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  Neurological: He is alert and oriented to person, place, and time. No cranial nerve deficit or sensory deficit. He exhibits normal muscle tone.  Skin: No rash noted. No erythema.  Psychiatric: He has a normal mood and affect.   LABORATORY PANEL:  Male CBC Recent Labs  Lab 08/08/17 0442  WBC 8.4  HGB 9.9*  HCT 29.6*  PLT 236   ------------------------------------------------------------------------------------------------------------------ Chemistries  Recent Labs  Lab 08/05/17 1152  08/08/17 0442  NA 137   < > 140  K 4.1   < > 3.3*  CL 100*   < > 106  CO2 29   < > 25  GLUCOSE 233*   < > 102*  BUN 15   < > 12  CREATININE 0.85   < > 0.94  CALCIUM 8.3*   < > 7.7*  MG  --    < > 1.7  AST 25  --   --   ALT 30  --   --   ALKPHOS 217*  --   --   BILITOT 0.8  --   --    < > = values in this interval not displayed.   RADIOLOGY:  No results found. ASSESSMENT AND PLAN:  The patient was admitted for left foot infection.  * Cardiac arrest and respiratory failure. S/p CPR and intubation.  Continue ventilation and CXR. Follow up Dr. Lonn Georgia. Cardiology consult appreciated  Extubated now.  * Hypotension. BP was low side, now 117/85. Left foot infection with osteomyelitis.  Zosyn and vancomycin pharmacy to dose. I&D planned by Podiary once stable.   * PAD.  Plan for vascular angiogram- may be now on monday.  * Hypertension.  started back on coreg and lisinopril. * Hypomagnesemia.  Given IV magnesium and follow-up level. * Diabetes.  Continue sliding scale.  Hold metformin. * History of chronic systolic CHF. Echo last month 25-30%. Cardiology on case, no ischemia work up now, Cont lasix.  Given IV lasix and had good diuresis, plan for some more diuresis by cardiology.   I informed Dr. Mariah Milling about need for Podiatry surgery, he is suggesting one more day IV diuresis. * Anemia of chronic disease. Stable Hb.  All the records  are reviewed and case discussed with Care Management/Social Worker. Management plans discussed with the patient, family and they are in agreement.  CODE STATUS: Full Code  TOTAL TIME TAKING CARE OF THIS PATIENT: 35 minutes.  More than 50% of the time was spent in counseling/coordination of care: YES  POSSIBLE D/C IN ? DAYS, DEPENDING ON CLINICAL CONDITION.   Altamese Dilling M.D on 08/08/2017 at 4:29 PM  Between 7am to 6pm - Pager - [phone]  After 6pm go to [email or web address] - Therapist, nutritional Hospitalists

## 2017-08-08 NOTE — Progress Notes (Signed)
Progress Note  Patient Name: Miguel Harvey Date of Encounter: 08/08/2017  Primary Cardiologist: new to [facility]  Subjective   Mild improvement of his breathing and congestion Still with cough Notes indicating 4.2 L negative yesterday Was given IV [id number]  Restarted on his carvedilol and lisinopril blood pressure stable No significant arrhythmia noted  Inpatient Medications    Scheduled Meds: . atorvastatin  40 mg Oral QPM  . carvedilol  6.25 mg Oral BID WC  . enoxaparin (LOVENOX) injection  40 mg Subcutaneous Q24H  . furosemide  40 mg Intravenous Daily  . insulin aspart  0-5 Units Subcutaneous QHS  . insulin aspart  0-9 Units Subcutaneous TID WC  . lisinopril  10 mg Oral Daily  . sodium chloride flush  10-40 mL Intracatheter Q12H  . tamsulosin  0.4 mg Oral Daily   Continuous Infusions: . famotidine (PEPCID) IV Stopped (08/08/17 1049)  . piperacillin-tazobactam (ZOSYN)  IV 3.375 g (08/08/17 1403)  . vancomycin Stopped (08/08/17 1403)   PRN Meds: acetaminophen **OR** acetaminophen, albuterol, bisacodyl, gadobenate dimeglumine, senna-docusate, sodium chloride flush   Vital Signs    Vitals:   08/08/17 1051 08/08/17 1100 08/08/17 1200 08/08/17 1228  BP:  (!) 148/90 139/80 (!) 127/92  Pulse:  74 75 74  Resp:  17 19 16   Temp: 97.7 F (36.5 C)   97.9 F (36.6 C)  TempSrc: Oral   Oral  SpO2:  99% 98% 98%  Weight:      Height:        Intake/Output Summary (Last 24 hours) at 08/08/2017 1433 Last data filed at 08/08/2017 1405 Gross per 24 hour  Intake 1740 ml  Output 5355 ml  Net -3615 ml   Filed Weights   08/05/17 1145  Weight: 201 lb 4.5 oz (91.3 kg)    Telemetry    Normal sinus rhythm - Personally Reviewed  ECG     - Personally Reviewed  Physical Exam   GEN:  Supine in bed, for pain better, mild cough Neck: unable to estimate JVD Cardiac: RRR, no murmurs, rubs, or gallops.  Respiratory:moderately decreased breath sounds throughout, Rales at the  bases GI: Soft, nontender, non-distended  MS: No edema; No deformity.  amputation right lower extremity Left foot in bandage, gangrenous toes noted Neuro:  Nonfocal , full exam not performed Psych: Normal affect , lethargic, minimally verbal  Labs    Chemistry Recent Labs  Lab 08/05/17 1152  08/06/17 2159 08/07/17 0431 08/08/17 0442  NA 137   < > 138 138 140  K 4.1   < > 4.0 3.7 3.3*  CL 100*   < > 104 106 106  CO2 29   < > 27 26 25   GLUCOSE 233*   < > 124* 101* 102*  BUN 15   < > 14 13 12   CREATININE 0.85   < > 0.73 0.75 0.94  CALCIUM 8.3*   < > 7.6* 7.4* 7.7*  PROT 6.8  --   --   --   --   ALBUMIN 2.4*  --   --   --   --   AST 25  --   --   --   --   ALT 30  --   --   --   --   ALKPHOS 217*  --   --   --   --   BILITOT 0.8  --   --   --   --   GFRNONAA >60   < > >  60 >60 >60  GFRAA >60   < > >60 >60 >60  ANIONGAP 8   < > 7 6 9    < > = values in this interval not displayed.     Hematology Recent Labs  Lab 08/06/17 1206 08/07/17 0431 08/08/17 0442  WBC 8.6 7.1 8.4  RBC 3.62* 3.46* 3.52*  HGB 10.0* 9.6* 9.9*  HCT 30.9* 29.1* 29.6*  MCV 85.4 84.2 83.9  MCH 27.7 27.8 28.1  MCHC 32.4 33.0 33.5  RDW 16.0* 15.8* 16.0*  PLT 285 229 236    Cardiac Enzymes Recent Labs  Lab 08/06/17 1206 08/06/17 1854 08/07/17 0431  TROPONINI 0.03* 0.05* 0.04*   No results for input(s): TROPIPOC in the last 168 hours.   BNPNo results for input(s): BNP, PROBNP in the last 168 hours.   DDimer No results for input(s): DDIMER in the last 168 hours.   Radiology    No results found.  Cardiac Studies   Echocardiogram - Left ventricle: The cavity size was mildly dilated. There was   mild concentric hypertrophy. Systolic function was severely   reduced. The estimated ejection fraction was in the range of 20%   to 25%. Diffuse hypokinesis. Regional wall motion abnormalities   cannot be excluded. The study is not technically sufficient to   allow evaluation of LV diastolic  function. - Mitral valve: There was moderate regurgitation. - Left atrium: The atrium was mildly dilated. - Right ventricle: The cavity size was mildly dilated. Wall   thickness was normal. Systolic function was mildly reduced. - Right atrium: The atrium was mildly dilated. - Tricuspid valve: There was moderate regurgitation. - Pulmonary arteries: Systolic pressure was severely elevated. PA   peak pressure: 68 mm Hg (S).  Patient Profile     [name] is a 72 y.o. male with a hx of coronary artery disease, cardiomyopathy ejection fraction 25% on echocardiogram, smoker hyperlipidemia, diabetes, hypertension, presenting to the hospital with diabetic foot ulcer/infection, Has prior below-knee amputation right lower extremity one month ago in [place] Long-standing ulceration left foot with no healing Does not ambulate much at baseline Started on Zosyn and vancomycin, Was being prepped for debridement left foot with Dr. Ether Griffins PEA arrest    Assessment & Plan     1. PEA arrest Etiology unclear,   prior to anesthesia induction while patient was being prepped for surgery.  Unable to exclude arrhythmia during the prep though no documentation   epi infusion started for blood pressure support and quickly weaned off  now extubated, mentating well No plan for cardiac catheterization  Prior cardiac catheterization 5 to 6 years ago with known nonischemic heart myopathy no significant coronary disease  2.  Nonischemic cardiomyopathy Low ejection fraction 25% on echocardiogram Cardiopathydating back to 2013, cardiac catheterization nonischemic Markedly elevated right heart pressures on recent echocardiogram Continue carvedilol lisinopril diuretics  3.  Osteomyelitis left foot Being followed by podiatry Plan was for irrigation and debridement,  unable to exclude amputation Vascular following On broad-spectrum antibiotics Pain control, he reports better today  4. Diabetes:   long history of poor control diabetes Hemoglobin A1c 9.5 Management per primary medicine service  Case discussed with intensivist, nursing  Total encounter time more than 35 minutes  Greater than 50% was spent in counseling and coordination of care with the patient   For questions or updates, please contact [facility] Please consult [email or web address] for contact info under Cardiology/STEMI.      Signed, Julien Nordmann, MD  08/08/2017, 2:33  PM

## 2017-08-08 NOTE — Plan of Care (Signed)
VSS O/N, UOP 100 - 200 mL/h, Pt. Alert & O to self consistently/able to follow commands, but intermittently confused. Pt.'s K+ and Mg replaced this AM. No care concerns at this time. Report given to [facility].

## 2017-08-08 NOTE — Progress Notes (Signed)
Pt transferred to room 259 per bed.  Report given to [facility]. Pt seen by Dr Mariah Milling prior to transfer. Lasix 40mg  iv given. Magnesium replacement completed. Last run KCL running. Alert but confused. Denies pain. Resp non labored. On room air with good sats. Lungs coarse. Productive cough but swallows secretions. Noon blood sugar 97.Diet ordered.Marland Kitchen

## 2017-08-08 NOTE — Progress Notes (Signed)
[employer] [facility] [place] [facility]   PATIENT NAME: Miguel Harvey    MR#:  [id number]  DATE OF BIRTH:  October 14, 1945  SUBJECTIVE:  CHIEF COMPLAINT:  No chief complaint on file.  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 During pre-op for left Foot procedure had PEA- code, intuabated and in ICU, now extubated this am,  No complains. REVIEW OF SYSTEMS:  Review of Systems  Constitutional: Negative for chills and fever.  HENT: Negative for congestion, ear discharge, hearing loss and tinnitus.   Eyes: Negative for blurred vision, double vision and discharge.  Respiratory: Negative for cough, shortness of breath and wheezing.   Cardiovascular: Negative for chest pain, palpitations and leg swelling.  Gastrointestinal: Negative for abdominal pain, blood in stool, nausea and vomiting.  Genitourinary: Negative for dysuria and urgency.  Musculoskeletal: Negative for back pain.  Skin: Negative for rash.  Neurological: Negative for dizziness, tremors and focal weakness.  Endo/Heme/Allergies: Does not bruise/bleed easily.  Psychiatric/Behavioral: Negative for depression.    DRUG ALLERGIES:  No Known Allergies VITALS:  Blood pressure (!) 154/92, pulse 80, temperature 97.8 F (36.6 C), temperature source Oral, resp. rate 18, height 5\' 9"  (1.753 m), weight 91.3 kg (201 lb 4.5 oz), SpO2 96 %. PHYSICAL EXAMINATION:  Physical Exam  Constitutional: He is oriented to person, place, and time.  HENT:  Head: Normocephalic.  Eyes: Pupils are equal, round, and reactive to light. No scleral icterus.  Neck: Neck supple. No JVD present. No tracheal deviation present.  Cardiovascular: Normal rate, regular rhythm and normal heart sounds. Exam reveals no gallop.  No murmur heard. Pulmonary/Chest: He has no wheezes. He has no rales.  Crackles  Abdominal: Soft. He exhibits no distension. There is no tenderness. There is no rebound.  Musculoskeletal: He exhibits no edema or  tenderness.  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  Neurological: He is alert and oriented to person, place, and time. No cranial nerve deficit or sensory deficit. He exhibits normal muscle tone.  Skin: No rash noted. No erythema.  Psychiatric: He has a normal mood and affect.   LABORATORY PANEL:  Male CBC Recent Labs  Lab 08/08/17 0442  WBC 8.4  HGB 9.9*  HCT 29.6*  PLT 236   ------------------------------------------------------------------------------------------------------------------ Chemistries  Recent Labs  Lab 08/05/17 1152  08/08/17 0442  NA 137   < > 140  K 4.1   < > 3.3*  CL 100*   < > 106  CO2 29   < > 25  GLUCOSE 233*   < > 102*  BUN 15   < > 12  CREATININE 0.85   < > 0.94  CALCIUM 8.3*   < > 7.7*  MG  --    < > 1.7  AST 25  --   --   ALT 30  --   --   ALKPHOS 217*  --   --   BILITOT 0.8  --   --    < > = values in this interval not displayed.   RADIOLOGY:  No results found. ASSESSMENT AND PLAN:  The patient was admitted for left foot infection.  * Cardiac arrest and respiratory failure. S/p CPR and intubation.  Continue ventilation and CXR. Follow up Dr. Lonn Georgia. Cardiology consult appreciated  Extubated now.  * Hypotension. BP was low side, now 117/85. Left foot infection with osteomyelitis.  Zosyn and vancomycin pharmacy to dose. I&D planned by Podiary once stable.   * PAD.  Plan for vascular angiogram- may be now on monday.  * Hypertension.  started back on coreg and lisinopril. * Hypomagnesemia.  Given IV magnesium and follow-up level. * Diabetes.  Continue sliding scale.  Hold metformin. * History of chronic systolic CHF. Echo last month 25-30%. Cardiology on case, no ischemia work up now, Cont lasix. * Anemia of chronic disease. Stable Hb.  All the records are reviewed and case discussed with Care Management/Social Worker. Management plans discussed with the patient, family and they are in agreement.  CODE STATUS: Full Code  TOTAL  TIME TAKING CARE OF THIS PATIENT: 35 minutes.  More than 50% of the time was spent in counseling/coordination of care: YES  POSSIBLE D/C IN ? DAYS, DEPENDING ON CLINICAL CONDITION.   Altamese Dilling M.D on 08/08/2017 at 9:50 AM  Between 7am to 6pm - Pager - [phone]  After 6pm go to [email or web address] - Therapist, nutritional Hospitalists

## 2017-08-08 NOTE — Progress Notes (Signed)
Pt transferred to tele floor. Pt alert and somewhat oriented. Some confusion compared to admission. No complaints of pain.  Will await cardiac and medical clearance to proceed with I & D as pt has active osteomyelitis and abscess in left foot.  Spoke to anesthesia and should be able to perform under regional anesthesia (popliteal) with minimal sedation.  C/W daily dressing changes.

## 2017-08-08 NOTE — Progress Notes (Signed)
Follow up - Critical Care Medicine Note  Patient Details:    Miguel Harvey is an 72 y.o. male.Past medical history remarkable for hypertension, congestive heart failure, nonischemic cardiomyopathy with prior ejection fraction of 20-25%, diabetes mellitus with peripheral vascular disease, status post right BKA, 3 weeks prior, status post cardiac arrest prior to left foot debridement.   Lines, Airways, Drains: Airway (Active)  Secured at (cm) 23 cm 08/07/2017  7:00 AM  Measured From Lips 08/07/2017  7:00 AM  Secured Location Center 08/07/2017  7:00 AM  Secured By [employer] 08/07/2017  7:00 AM  Tube Holder Repositioned Yes 08/06/2017  7:39 PM  Cuff Pressure (cm H2O) 28 cm H2O 08/07/2017  7:00 AM  Site Condition Dry 08/07/2017  4:00 AM     CVC Triple Lumen 08/06/17 Right Femoral (Active)  Indication for Insertion or Continuance of Line Administration of hyperosmolar/irritating solutions (i.e. TPN, Vancomycin, etc.) 08/06/2017  8:00 PM  Site Assessment Clean;Dry;Intact 08/06/2017  8:00 PM  Proximal Lumen Status Infusing;Flushed;Blood return noted 08/06/2017  8:00 PM  Medial Lumen Status Infusing;Flushed;Blood return noted 08/06/2017  8:00 PM  Distal Lumen Status Flushed;Blood return noted;In-line blood sampling system in place 08/06/2017  8:00 PM  Dressing Type Transparent;Occlusive 08/06/2017  8:00 PM  Dressing Status Dry;Intact;Antimicrobial disc in place;Old drainage 08/06/2017  8:00 PM  Dressing Intervention Dressing changed;Antimicrobial disc changed 08/06/2017  8:00 PM  Dressing Change Due 08/13/17 08/06/2017  8:00 PM     Negative Pressure Wound Therapy Leg Right (Active)     NG/OG Tube Orogastric 18 Fr. Center mouth Xray  (Active)  External Length of Tube (cm) - (if applicable) 36 cm 08/07/2017  4:00 AM  Site Assessment Clean;Dry;Intact 08/07/2017  4:00 AM  Ongoing Placement Verification No change in cm markings or external length of tube from initial placement;No change in respiratory  status;No acute changes, not attributed to clinical condition;Xray 08/07/2017  4:00 AM  Status Suction-low intermittent 08/07/2017  4:00 AM  Amount of suction 80 mmHg 08/07/2017  4:00 AM  Drainage Appearance Clear 08/07/2017  4:00 AM     Urethral Catheter Madolyn Frieze RN Latex 16 Fr. (Active)  Indication for Insertion or Continuance of Catheter Unstable critical patients (first 24-48 hours) 08/07/2017  4:00 AM  Site Assessment Clean;Intact;Dry 08/07/2017  4:00 AM  Catheter Maintenance Bag below level of bladder;Catheter secured;Drainage bag/tubing not touching floor;Insertion date on drainage bag;No dependent loops;Seal intact;Bag emptied prior to transport 08/07/2017  4:00 AM  Collection Container Standard drainage bag 08/07/2017  4:00 AM  Securement Method Leg strap 08/07/2017  4:00 AM  Output (mL) 175 mL 08/07/2017  6:00 AM    Anti-infectives:  Anti-infectives (From admission, onward)   Start     Dose/Rate Route Frequency Ordered Stop   08/07/17 1800  vancomycin (VANCOCIN) 1,500 mg in sodium chloride 0.9 % 500 mL IVPB     1,500 mg 250 mL/hr over 120 Minutes Intravenous Every 18 hours 08/07/17 1026     08/05/17 2200  vancomycin (VANCOCIN) 1,500 mg in sodium chloride 0.9 % 500 mL IVPB  Status:  Discontinued     1,500 mg 250 mL/hr over 120 Minutes Intravenous Every 12 hours 08/05/17 1658 08/07/17 1012   08/05/17 1400  piperacillin-tazobactam (ZOSYN) IVPB 3.375 g     3.375 g 12.5 mL/hr over 240 Minutes Intravenous Every 8 hours 08/05/17 1311     08/05/17 1315  vancomycin (VANCOCIN) IVPB 1000 mg/200 mL premix     1,000 mg 200 mL/hr over 60 Minutes Intravenous  STAT 08/05/17 1310 08/05/17 1618      Microbiology: Results for orders placed or performed during the hospital encounter of 08/05/17  MRSA PCR Screening     Status: None   Collection Time: 08/05/17  5:17 PM  Result Value Ref Range Status   MRSA by PCR NEGATIVE NEGATIVE Final    Comment:        The GeneXpert MRSA Assay (FDA approved  for NASAL specimens only), is one component of a comprehensive MRSA colonization surveillance program. It is not intended to diagnose MRSA infection nor to guide or monitor treatment for MRSA infections. Performed at [facility], [address] [place], [place] [address]   Aerobic/Anaerobic Culture (surgical/deep wound)     Status: None (Preliminary result)   Collection Time: 08/06/17  2:42 PM  Result Value Ref Range Status   Specimen Description   Final    FOOT LEFT Performed at [facility], [address], [place], [place] [address]    Special Requests   Final    Immunocompromised Performed at [facility], [address] [place], [place] [address]    Gram Stain   Final    FEW WBC PRESENT,BOTH PMN AND MONONUCLEAR FEW GRAM POSITIVE COCCI RARE GRAM POSITIVE RODS    Culture   Final    CULTURE REINCUBATED FOR BETTER GROWTH Performed at [facility], [address] [place], [place] [address]    Report Status PENDING  Incomplete     Studies: Dg Abd 1 View  Result Date: 08/06/2017 CLINICAL DATA:  Orogastric tube placement. EXAM: ABDOMEN - 1 VIEW COMPARISON:  07/02/2017. FINDINGS: Interval mildly dilated, gas-filled loops of colon and small bowel. Orogastric tube tip in the proximal stomach. Thoracolumbar spine degenerative changes and mild scoliosis. IMPRESSION: 1. Orogastric tube tip in the proximal stomach. 2. Interval mild colonic and small bowel ileus or partial obstruction. Electronically Signed   By: Beckie Salts M.D.   On: 08/06/2017 13:52   Ct Head Wo Contrast  Result Date: 08/06/2017 CLINICAL DATA:  Altered level of consciousness. EXAM: CT HEAD WITHOUT CONTRAST TECHNIQUE: Contiguous axial images were obtained from the base of the skull through the vertex without intravenous contrast. COMPARISON:  CT scan of July 02, 2017. FINDINGS: Brain: Mild chronic ischemic white matter disease is noted. No mass effect or midline shift is noted.  Ventricular size is within normal limits. There is no evidence of mass lesion, hemorrhage or acute infarction. Vascular: No mass effect or midline shift is noted. Ventricular size is within normal limits. There is no evidence of mass lesion, hemorrhage or acute infarction. Skull: Normal. Negative for fracture or focal lesion. Sinuses/Orbits: No acute finding. Other: None. IMPRESSION: Mild chronic ischemic white matter disease. No acute intracranial abnormality seen. Electronically Signed   By: Lupita Raider, M.D.   On: 08/06/2017 13:30   Mr Foot Left Wo Contrast  Result Date: 08/06/2017 CLINICAL DATA:  Left fifth toe osteomyelitis. EXAM: MRI OF THE LEFT FOOT WITHOUT CONTRAST TECHNIQUE: Multiplanar, multisequence MR imaging of the left forefoot was performed. No intravenous contrast was administered. COMPARISON:  Left foot x-rays from yesterday. FINDINGS: Bones/Joint/Cartilage Increased marrow edema with osseous destruction and decreased T1 marrow signal involving the fifth metatarsal head, consistent with osteomyelitis. Near complete osseous destruction of the little toe phalanges. There is a angulated, pathologic fracture through the neck of the fifth metatarsal. Bone marrow edema extends throughout the fifth metatarsal shaft with preserved T1 marrow signal, likely reactive. There is  also abnormal marrow edema involving the fourth proximal phalanx. Although the T1 marrow signal is relatively preserved, there is probable early cortical destruction along the lateral base of the fourth proximal phalanx. Ligaments The fifth MTP collateral ligaments are likely eroded. Remaining toe collateral ligaments are intact. Lisfranc ligament is intact. Muscles and Tendons Flexor, peroneal and extensor compartment tendons are intact. No tenosynovitis. Increased T2 signal and atrophy within the intrinsic muscles of the forefoot, nonspecific, but likely related to diabetic muscle changes. Soft tissue There are multiple fluid  collections in the plantar and dorsal soft tissues of the lateral forefoot. The dorsal fluid collection overlying the fourth and fifth distal metatarsals measures up to 6.9 cm in AP dimension. Smaller 2.4 cm fluid collection along the lateral aspect of the fifth metatarsal head. Slightly more ill-defined 5.7 cm fluid collection in the plantar soft tissues involving the plantar aponeurosis. Ill-defined fluid extends into the plantar aspect of the second webspace and base of the second and third toes. IMPRESSION: 1. 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 2. Multiple abscesses in the plantar and dorsal soft tissues of the lateral forefoot as described above. Electronically Signed   By: Obie Dredge M.D.   On: 08/06/2017 08:27   Dg Chest Port 1 View  Result Date: 08/06/2017 CLINICAL DATA:  Intubated.  Smoker. EXAM: PORTABLE CHEST 1 VIEW COMPARISON:  07/02/2017. FINDINGS: Endotracheal tube in satisfactory position. Orogastric tube extending into the stomach. Stable enlarged cardiac silhouette. Decreased prominence of the pulmonary vasculature and interstitial markings. No pleural fluid seen. Mild thoracic spine degenerative changes. IMPRESSION: Stable cardiomegaly with mildly improved mild changes of congestive heart failure. Electronically Signed   By: Beckie Salts M.D.   On: 08/06/2017 13:54   Dg Foot Complete Left  Result Date: 08/05/2017 CLINICAL DATA:  LEFT foot infection, prior RIGHT below-knee amputation fourth and fifth toe necrosis with fluctuance at plantar aspect question infection/abscess EXAM: LEFT FOOT - COMPLETE 3+ VIEW COMPARISON:  06/28/2017 FINDINGS: Diffuse osseous demineralization. Progressive bone destruction involving the base of the proximal phalanx of the LEFT fifth toe. New bone destruction involving the head and neck of the LEFT fifth metatarsal consistent with progressive osteomyelitis, involving the fifth MTP  joint. Diffuse osteolysis of the phalanges of the LEFT fifth toe. Remain joint spaces preserved. No additional fracture, dislocation or bone destruction. IMPRESSION: Progressive bone destruction involving the proximal phalanx of the LEFT fifth toe with new bone destruction of the head and neck of the LEFT fifth metatarsal consistent with progressive osteomyelitis. Electronically Signed   By: Ulyses Southward M.D.   On: 08/05/2017 15:18    Consults: Treatment Team:  Antonieta Iba, MD Schnier, Latina Craver, MD Tora Kindred, DO Mick Sell, MD   Subjective:    Overnight Issues: patient did well overnight, stable hemodynamics. Patient is status post extubation  Objective:  Vital signs for last 24 hours: Temp:  [97.5 F (36.4 C)-98.6 F (37 C)] 97.8 F (36.6 C) (05/12 0747) Pulse Rate:  [74-91] 84 (05/12 0600) Resp:  [12-30] 22 (05/12 0600) BP: (122-150)/(71-111) 145/85 (05/12 0600) SpO2:  [90 %-100 %] 95 % (05/12 0600)  Hemodynamic parameters for last 24 hours:    Intake/Output from previous day: 05/11 0701 - 05/12 0700 In: 1131.3 [I.V.:281.3; IV Piggyback:850] Out: 5355 [Urine:5355]  Intake/Output this shift: No intake/output data recorded.  Vent settings for last 24 hours:    Physical Exam:  Patient is  awake, alert and communicating Vital signs: Please see the above listed vital signs HEENT: Trachea midline, no accessory muscle utilization Cardiovascular: Regular rate and rhythm Pulmonary: Clear to auscultation Abdominal: Positive bowel sounds, soft nontender exam Extremities: Left foot is bandaged Neurologic: Patient appears to have had a full recovery postcardiac arrest  Assessment/Plan:   Status post cardiac arrest with return of spontaneous circulation. Doing well. Stable hemodynamics. We'll transfer to the floor  Patient with nonischemic cardiomyopathy and reduced ejection fraction. Has had recent cardiac catheterization without any occlusive disease. Peak  troponin post arrest was 0.05  Severe peripheral arterial disease. Patient on vancomycin and Zosyn, was pending debridement of left foot   Miguel Harvey,Miguel Harvey 08/08/2017  *Care during the described time interval was provided by me and/or other providers on the critical care team.  I have reviewed this patient's available data, including medical history, events of note, physical examination and test results as part of my evaluation. Patient ID: Miguel Harvey, male   DOB: 15-Oct-1945, 72 y.o.   MRN: [id number]

## 2017-08-08 NOTE — Progress Notes (Signed)
Follow up - Critical Care Medicine Note  Patient Details:    Miguel Harvey is an 72 y.o. male.Past medical history remarkable for hypertension, congestive heart failure, nonischemic cardiomyopathy with prior ejection fraction of 20-25%, diabetes mellitus with peripheral vascular disease, status post right BKA, 3 weeks prior, status post cardiac arrest prior to left foot debridement.    Subjective:    Overnight Issues: No acute issues overnight.  Tolerating nasal cannula.  Still rhonchorous with garbled speech.  CT head was negative.  No arrhythmias.  Seen by cardiology and there is no plan for any interventions.  Anti-infectives:  Anti-infectives (From admission, onward)   Start     Dose/Rate Route Frequency Ordered Stop   08/07/17 1800  vancomycin (VANCOCIN) 1,500 mg in sodium chloride 0.9 % 500 mL IVPB     1,500 mg 250 mL/hr over 120 Minutes Intravenous Every 18 hours 08/07/17 1026     08/05/17 2200  vancomycin (VANCOCIN) 1,500 mg in sodium chloride 0.9 % 500 mL IVPB  Status:  Discontinued     1,500 mg 250 mL/hr over 120 Minutes Intravenous Every 12 hours 08/05/17 1658 08/07/17 1012   08/05/17 1400  piperacillin-tazobactam (ZOSYN) IVPB 3.375 g     3.375 g 12.5 mL/hr over 240 Minutes Intravenous Every 8 hours 08/05/17 1311     08/05/17 1315  vancomycin (VANCOCIN) IVPB 1000 mg/200 mL premix     1,000 mg 200 mL/hr over 60 Minutes Intravenous STAT 08/05/17 1310 08/05/17 1618      Microbiology: Results for orders placed or performed during the hospital encounter of 08/05/17  MRSA PCR Screening     Status: None   Collection Time: 08/05/17  5:17 PM  Result Value Ref Range Status   MRSA by PCR NEGATIVE NEGATIVE Final    Comment:        The GeneXpert MRSA Assay (FDA approved for NASAL specimens only), is one component of a comprehensive MRSA colonization surveillance program. It is not intended to diagnose MRSA infection nor to guide or monitor treatment for MRSA  infections. Performed at [facility], [address] [place], [place] [address]   Aerobic/Anaerobic Culture (surgical/deep wound)     Status: None (Preliminary result)   Collection Time: 08/06/17  2:42 PM  Result Value Ref Range Status   Specimen Description   Final    FOOT LEFT Performed at [facility], [address], [place], [place] [address]    Special Requests   Final    Immunocompromised Performed at [facility], [address] [place], [place] [address]    Gram Stain   Final    FEW WBC PRESENT,BOTH PMN AND MONONUCLEAR FEW GRAM POSITIVE COCCI RARE GRAM POSITIVE RODS    Culture   Final    CULTURE REINCUBATED FOR BETTER GROWTH Performed at [facility], [address], [place], [place] [address]    Report Status PENDING  Incomplete     Studies: Dg Abd 1 View  Result Date: 08/06/2017 CLINICAL DATA:  Orogastric tube placement. EXAM: ABDOMEN - 1 VIEW COMPARISON:  07/02/2017. FINDINGS: Interval mildly dilated, gas-filled loops of colon and small bowel. Orogastric tube tip in the proximal stomach. Thoracolumbar spine degenerative changes and mild scoliosis. IMPRESSION: 1. Orogastric tube tip in the proximal stomach. 2. Interval mild colonic and small bowel ileus or partial obstruction. Electronically Signed   By: Beckie Salts M.D.   On: 08/06/2017 13:52   Ct Head Wo Contrast  Result Date: 08/06/2017 CLINICAL  DATA:  Altered level of consciousness. EXAM: CT HEAD WITHOUT CONTRAST TECHNIQUE: Contiguous axial images were obtained from the base of the skull through the vertex without intravenous contrast. COMPARISON:  CT scan of July 02, 2017. FINDINGS: Brain: Mild chronic ischemic white matter disease is noted. No mass effect or midline shift is noted. Ventricular size is within normal limits. There is no evidence of mass lesion, hemorrhage or acute infarction. Vascular: No mass effect or midline shift is noted. Ventricular size is within normal  limits. There is no evidence of mass lesion, hemorrhage or acute infarction. Skull: Normal. Negative for fracture or focal lesion. Sinuses/Orbits: No acute finding. Other: None. IMPRESSION: Mild chronic ischemic white matter disease. No acute intracranial abnormality seen. Electronically Signed   By: Lupita Raider, M.D.   On: 08/06/2017 13:30   Mr Foot Left Wo Contrast  Result Date: 08/06/2017 CLINICAL DATA:  Left fifth toe osteomyelitis. EXAM: MRI OF THE LEFT FOOT WITHOUT CONTRAST TECHNIQUE: Multiplanar, multisequence MR imaging of the left forefoot was performed. No intravenous contrast was administered. COMPARISON:  Left foot x-rays from yesterday. FINDINGS: Bones/Joint/Cartilage Increased marrow edema with osseous destruction and decreased T1 marrow signal involving the fifth metatarsal head, consistent with osteomyelitis. Near complete osseous destruction of the little toe phalanges. There is a angulated, pathologic fracture through the neck of the fifth metatarsal. Bone marrow edema extends throughout the fifth metatarsal shaft with preserved T1 marrow signal, likely reactive. There is also abnormal marrow edema involving the fourth proximal phalanx. Although the T1 marrow signal is relatively preserved, there is probable early cortical destruction along the lateral base of the fourth proximal phalanx. Ligaments The fifth MTP collateral ligaments are likely eroded. Remaining toe collateral ligaments are intact. Lisfranc ligament is intact. Muscles and Tendons Flexor, peroneal and extensor compartment tendons are intact. No tenosynovitis. Increased T2 signal and atrophy within the intrinsic muscles of the forefoot, nonspecific, but likely related to diabetic muscle changes. Soft tissue There are multiple fluid collections in the plantar and dorsal soft tissues of the lateral forefoot. The dorsal fluid collection overlying the fourth and fifth distal metatarsals measures up to 6.9 cm in AP dimension.  Smaller 2.4 cm fluid collection along the lateral aspect of the fifth metatarsal head. Slightly more ill-defined 5.7 cm fluid collection in the plantar soft tissues involving the plantar aponeurosis. Ill-defined fluid extends into the plantar aspect of the second webspace and base of the second and third toes. IMPRESSION: 1. 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 2. Multiple abscesses in the plantar and dorsal soft tissues of the lateral forefoot as described above. Electronically Signed   By: Obie Dredge M.D.   On: 08/06/2017 08:27   Dg Chest Port 1 View  Result Date: 08/06/2017 CLINICAL DATA:  Intubated.  Smoker. EXAM: PORTABLE CHEST 1 VIEW COMPARISON:  07/02/2017. FINDINGS: Endotracheal tube in satisfactory position. Orogastric tube extending into the stomach. Stable enlarged cardiac silhouette. Decreased prominence of the pulmonary vasculature and interstitial markings. No pleural fluid seen. Mild thoracic spine degenerative changes. IMPRESSION: Stable cardiomegaly with mildly improved mild changes of congestive heart failure. Electronically Signed   By: Beckie Salts M.D.   On: 08/06/2017 13:54   Dg Foot Complete Left  Result Date: 08/05/2017 CLINICAL DATA:  LEFT foot infection, prior RIGHT below-knee amputation fourth and fifth toe necrosis with fluctuance at plantar aspect question infection/abscess EXAM: LEFT FOOT - COMPLETE 3+ VIEW COMPARISON:  06/28/2017  FINDINGS: Diffuse osseous demineralization. Progressive bone destruction involving the base of the proximal phalanx of the LEFT fifth toe. New bone destruction involving the head and neck of the LEFT fifth metatarsal consistent with progressive osteomyelitis, involving the fifth MTP joint. Diffuse osteolysis of the phalanges of the LEFT fifth toe. Remain joint spaces preserved. No additional fracture, dislocation or bone destruction. IMPRESSION: Progressive bone destruction  involving the proximal phalanx of the LEFT fifth toe with new bone destruction of the head and neck of the LEFT fifth metatarsal consistent with progressive osteomyelitis. Electronically Signed   By: Ulyses Southward M.D.   On: 08/05/2017 15:18    Consults: Treatment Team:  Antonieta Iba, MD Schnier, Latina Craver, MD Tora Kindred, DO Mick Sell, MD    Objective:  Vital signs for last 24 hours: Temp:  [97.5 F (36.4 C)-98.6 F (37 C)] 97.5 F (36.4 C) (05/12 0400) Pulse Rate:  [70-91] 84 (05/12 0600) Resp:  [12-30] 22 (05/12 0600) BP: (122-150)/(71-111) 145/85 (05/12 0600) SpO2:  [90 %-100 %] 95 % (05/12 0600)  Hemodynamic parameters for last 24 hours:    Intake/Output from previous day: 05/11 0701 - 05/12 0700 In: 1131.3 [I.V.:281.3; IV Piggyback:850] Out: 5355 [Urine:5355]  Intake/Output this shift: No intake/output data recorded.     Physical Exam:  Patient is awake, alert and communicating Vital signs: Please see the above listed vital signs HEENT: PERRLA, trachea midline, no accessory muscle utilization Cardiovascular: Regular rate and rhythm Pulmonary: Clear to auscultation Abdominal: Positive bowel sounds, soft nontender exam Extremities: Left foot is bandaged Neurologic: Patient appears to have had a full recovery postcardiac arrest  Assessment/Plan:   Status post PEA cardiac arrest with return of spontaneous circulation. Patient doing well this morning, awake and alert, and communicating.  Continue recommendations per cardiology   Patient with nonischemic cardiomyopathy and reduced ejection fraction. Has had recent cardiac catheterization without any occlusive disease. Peak troponin post arrest was 0.05.  Continue current treatment plan  Severe peripheral arterial disease. Patient on vancomycin and Zosyn.  Follow-up with Ortho regarding debridement of left foot  Shock.Has resolved, stable hemodynamics  Unequal pupil. Negative head CT for acute  intracranial abnormality  Anemia. No evidence of active bleeding  Electrolyte abnormality. On protocol for replacement   Patient is stable for transfer out of the ICU  [facility] ANP-BC Pulmonary and Critical Care Medicine [facility] Pager [phone] or [phone]  NB: This document was prepared using Dragon voice recognition software and may include unintentional dictation errors.   08/08/2017

## 2017-08-08 NOTE — Progress Notes (Signed)
Talked to Dr. Elisabeth Pigeon about patient's foley cath in place for IV diuresis, order given to stay 1 more day. Also talked about patient coughing at times when eating lunch, patient was intubated at ICU, order for speech given. Patient also had 7 beats of wide QRS and more frequent, patient potassium today was 3.3 and replace with IV potassium. No other concern at the moment. RN will continue to monitor.

## 2017-08-09 DIAGNOSIS — I5023 Acute on chronic systolic (congestive) heart failure: Secondary | ICD-10-CM

## 2017-08-09 LAB — BASIC METABOLIC PANEL
Anion gap: 7 (ref 5–15)
BUN: 12 mg/dL (ref 6–20)
CALCIUM: 7.5 mg/dL — AB (ref 8.9–10.3)
CHLORIDE: 105 mmol/L (ref 101–111)
CO2: 27 mmol/L (ref 22–32)
CREATININE: 0.95 mg/dL (ref 0.61–1.24)
GFR calc non Af Amer: >60 mL/min (ref >60)
Glucose, Bld: 160 mg/dL — ABNORMAL HIGH (ref 65–99)
Potassium: 3 mmol/L — ABNORMAL LOW (ref 3.5–5.1)
SODIUM: 139 mmol/L (ref 135–145)

## 2017-08-09 LAB — GLUCOSE, CAPILLARY
GLUCOSE-CAPILLARY: 137 mg/dL — AB (ref 65–99)
GLUCOSE-CAPILLARY: 188 mg/dL — AB (ref 65–99)
GLUCOSE-CAPILLARY: 204 mg/dL — AB (ref 65–99)
Glucose-Capillary: 137 mg/dL — ABNORMAL HIGH (ref 65–99)
Glucose-Capillary: 149 mg/dL — ABNORMAL HIGH (ref 65–99)

## 2017-08-09 LAB — TRIGLYCERIDES: TRIGLYCERIDES: 79 mg/dL (ref <150)

## 2017-08-09 LAB — TSH: TSH: 9.524 u[IU]/mL — ABNORMAL HIGH (ref 0.350–4.500)

## 2017-08-09 MED ORDER — CARVEDILOL 12.5 MG PO TABS
12.5000 mg | ORAL_TABLET | Freq: Two times a day (BID) | ORAL | Status: DC
  Administered 2017-08-09: 12.5 mg via ORAL
  Filled 2017-08-09: qty 1

## 2017-08-09 MED ORDER — OXYCODONE-ACETAMINOPHEN 5-325 MG PO TABS
1.0000 | ORAL_TABLET | Freq: Four times a day (QID) | ORAL | Status: DC | PRN
  Administered 2017-08-09 – 2017-08-10 (×4): 1 via ORAL
  Filled 2017-08-09 (×4): qty 1

## 2017-08-09 MED ORDER — OLANZAPINE 10 MG IM SOLR
5.0000 mg | Freq: Once | INTRAMUSCULAR | Status: AC
  Administered 2017-08-09: 5 mg via INTRAMUSCULAR
  Filled 2017-08-09: qty 10

## 2017-08-09 MED ORDER — CARVEDILOL 25 MG PO TABS
25.0000 mg | ORAL_TABLET | Freq: Two times a day (BID) | ORAL | Status: DC
  Administered 2017-08-09 – 2017-08-14 (×9): 25 mg via ORAL
  Filled 2017-08-09 (×9): qty 1

## 2017-08-09 MED ORDER — CHLORHEXIDINE GLUCONATE 4 % EX LIQD: 60.0000 mL | Freq: Once | CUTANEOUS | Status: DC

## 2017-08-09 MED ORDER — POTASSIUM CHLORIDE CRYS ER 20 MEQ PO TBCR
40.0000 meq | EXTENDED_RELEASE_TABLET | Freq: Three times a day (TID) | ORAL | Status: AC
  Administered 2017-08-09 – 2017-08-10 (×2): 40 meq via ORAL
  Filled 2017-08-09 (×3): qty 2

## 2017-08-09 NOTE — Progress Notes (Signed)
Progress Note  Patient Name: Miguel Harvey Date of Encounter: 08/09/2017  Primary Cardiologist: new to [facility] - consult by G[place]  Subjective   No complaints this morning. Documented UOP of 1.4 L for the past 24 hours and net - 1.6 L for the admission. Potassium remains low despite IV repletion on 5/12. Renal function normal. BP trending up. No recent weights.   Inpatient Medications    Scheduled Meds: . atorvastatin  40 mg Oral QPM  . carvedilol  12.5 mg Oral BID WC  . enoxaparin (LOVENOX) injection  40 mg Subcutaneous Q24H  . furosemide  40 mg Intravenous Daily  . insulin aspart  0-5 Units Subcutaneous QHS  . insulin aspart  0-9 Units Subcutaneous TID WC  . lisinopril  10 mg Oral Daily  . sodium chloride flush  10-40 mL Intracatheter Q12H  . tamsulosin  0.4 mg Oral Daily   Continuous Infusions: . famotidine (PEPCID) IV 20 mg (08/09/17 1106)  . piperacillin-tazobactam (ZOSYN)  IV Stopped (08/09/17 1020)  . vancomycin Stopped (08/09/17 0820)   PRN Meds: acetaminophen **OR** acetaminophen, albuterol, bisacodyl, gadobenate dimeglumine, LORazepam, oxyCODONE-acetaminophen, senna-docusate, sodium chloride flush   Vital Signs    Vitals:   08/08/17 1228 08/08/17 1800 08/09/17 0356 08/09/17 0358  BP: (!) 127/92 (!) 152/88 (!) 154/108 (!) 153/95  Pulse: 74 83 (!) 119 89  Resp: 16 18    Temp: 97.9 F (36.6 C) 97.9 F (36.6 C) 97.7 F (36.5 C)   TempSrc: Oral Oral Oral   SpO2: 98% 98% 100%   Weight:      Height:        Intake/Output Summary (Last 24 hours) at 08/09/2017 1158 Last data filed at 08/09/2017 0415 Gross per 24 hour  Intake 412 ml  Output 2400 ml  Net -1988 ml   Filed Weights   08/05/17 1145  Weight: 201 lb 4.5 oz (91.3 kg)    Telemetry    NSR with PVCs, occasionally in a pattern of bigeminy, rare ventricular triplets - Personally Reviewed  ECG    n/a - Personally Reviewed  Physical Exam   GEN: No acute distress.   Neck: No JVD. Cardiac: RRR,  no murmurs, rubs, or gallops.  Respiratory: Clear to auscultation bilaterally.  GI: Soft, nontender, non-distended.   MS: Status post right BKA. Left foot dressed with necrotic toes noted.  Neuro:  Alert and oriented x 3; Nonfocal.  Psych: Normal affect.  Labs    Chemistry Recent Labs  Lab 08/05/17 1152  08/07/17 0431 08/08/17 0442 08/09/17 1050  NA 137   < > 138 140 139  K 4.1   < > 3.7 3.3* 3.0*  CL 100*   < > 106 106 105  CO2 29   < > 26 25 27   GLUCOSE 233*   < > 101* 102* 160*  BUN 15   < > 13 12 12   CREATININE 0.85   < > 0.75 0.94 0.95  CALCIUM 8.3*   < > 7.4* 7.7* 7.5*  PROT 6.8  --   --   --   --   ALBUMIN 2.4*  --   --   --   --   AST 25  --   --   --   --   ALT 30  --   --   --   --   ALKPHOS 217*  --   --   --   --   BILITOT 0.8  --   --   --   --  GFRNONAA >60   < > >60 >60 >60  GFRAA >60   < > >60 >60 >60  ANIONGAP 8   < > 6 9 7    < > = values in this interval not displayed.     Hematology Recent Labs  Lab 08/06/17 1206 08/07/17 0431 08/08/17 0442  WBC 8.6 7.1 8.4  RBC 3.62* 3.46* 3.52*  HGB 10.0* 9.6* 9.9*  HCT 30.9* 29.1* 29.6*  MCV 85.4 84.2 83.9  MCH 27.7 27.8 28.1  MCHC 32.4 33.0 33.5  RDW 16.0* 15.8* 16.0*  PLT 285 229 236    Cardiac Enzymes Recent Labs  Lab 08/06/17 1206 08/06/17 1854 08/07/17 0431  TROPONINI 0.03* 0.05* 0.04*   No results for input(s): TROPIPOC in the last 168 hours.   BNPNo results for input(s): BNP, PROBNP in the last 168 hours.   DDimer No results for input(s): DDIMER in the last 168 hours.   Radiology    No results found.  Cardiac Studies   LHC 05/2011: IMPRESSION: 1. Nonischemic cardiomyopathy with ejection fraction approximately 25%     with global reduction of left ventricular function. 2. Essentially normal coronary arteries with minimal 10-20% smooth     narrowing of the ostium of the left anterior descending.  RECOMMENDATION:  Increasing medical therapy.  The patient ultimately will need  reassessment of LV function to see if LV function improves and if not, he may be a candidate for prophylactic ICD implantation if necessary.   TTE 07/2017: Study Conclusions  - Left ventricle: The cavity size was mildly dilated. There was   mild concentric hypertrophy. Systolic function was severely   reduced. The estimated ejection fraction was in the range of 20%   to 25%. Diffuse hypokinesis. Regional wall motion abnormalities   cannot be excluded. The study is not technically sufficient to   allow evaluation of LV diastolic function. - Mitral valve: There was moderate regurgitation. - Left atrium: The atrium was mildly dilated. - Right ventricle: The cavity size was mildly dilated. Wall   thickness was normal. Systolic function was mildly reduced. - Right atrium: The atrium was mildly dilated. - Tricuspid valve: There was moderate regurgitation. - Pulmonary arteries: Systolic pressure was severely elevated. PA   peak pressure: 68 mm Hg (S).  Patient Profile     72 y.o. 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  Assessment & Plan    1. Cardiac/respiratory arrest: -Uncertain etiology -Unable to exclude arrhythmia  -Monitor on telemetry -May need EP evaluation as below given his persistent CM -Consider PE evaluation, defer to IM -Echo as above -No plans for ischemic evaluation per prior notes  2. Nonobstructive CAD/elevated troponin: -Peaked at 0.05 -Echo as above -As above -Continue Coreg, lisinopril, Lipitor   3. NICM/pulmonary HTN: -He continues to appear volume up -Consider PE evaluation as above given his severely elevated right-sided pressures and mention of PEA arrest,  defer to IM -Coreg, lisinopril -Consider ACEi washout x 36 hours followed by starting of Entresto -IV Lasix -May benefit from EP evaluation for possible ICD given persistently low EF  4. PVCs: -Replete magnesium to goal > 2.0, received IV on 5/12 -Replete potassium to goal > 4.0, received IV on 5/12, will give  another 40 mEq tid x 3 doses today -Check TSH -Increase Coreg to 25 mg bid  5. PAD/Osteomyelitis of the left foot: -Per podiatry/IM  6. DM2: -Per IM  7. Anemia of chronic disease: -Stable  8. HTN: -Suboptimally controlled -Coreg, lisinopril as above  For questions or updates, please contact [facility] Please consult [email or web address] for contact info under Cardiology/STEMI.    Signed, Eula Listen, PA-C [facility] Pager: [phone] 08/09/2017, 11:58 AM

## 2017-08-09 NOTE — Progress Notes (Signed)
Chart reviewed. Pt visited. Pt with audible congestion when breathing. Pt was sleeping upon ST entering but awakened easily. Discussed need for swallow eval. Pt stated that he didn't want to try anything to eat. When encourage with stressing the importance of swallow eval, Pt yelled and cursed at ST. Will reattempt tomorrow in hopes of willingness to participate.

## 2017-08-09 NOTE — Care Management Important Message (Signed)
Copy of signed IM left in patient's room.    

## 2017-08-09 NOTE — Progress Notes (Signed)
Dressing to left foot replaced. Old 2x2 from between toes was saturated with purulent drainage. Clean 2x2 placed and foot wrapped with kerlex. I will continue to assess.

## 2017-08-09 NOTE — Progress Notes (Signed)
[employer] - [place] [facility]   PATIENT NAME: Miguel Harvey    MR#:  [id number]  DATE OF BIRTH:  Nov 28, 1945  SUBJECTIVE:  CHIEF COMPLAINT:  No chief complaint on file.  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 During pre-op for left Foot procedure had PEA- code, intuabated and in ICU, now extubated this am,  No complains.  remains confused.  REVIEW OF SYSTEMS:  Review of Systems  Constitutional: Negative for chills and fever.  HENT: Negative for congestion, ear discharge, hearing loss and tinnitus.   Eyes: Negative for blurred vision, double vision and discharge.  Respiratory: Negative for cough, shortness of breath and wheezing.   Cardiovascular: Negative for chest pain, palpitations and leg swelling.  Gastrointestinal: Negative for abdominal pain, blood in stool, nausea and vomiting.  Genitourinary: Negative for dysuria and urgency.  Musculoskeletal: Negative for back pain.  Skin: Negative for rash.  Neurological: Negative for dizziness, tremors and focal weakness.  Endo/Heme/Allergies: Does not bruise/bleed easily.  Psychiatric/Behavioral: Negative for depression.    DRUG ALLERGIES:  No Known Allergies VITALS:  Blood pressure (!) 153/95, pulse 89, temperature 97.7 F (36.5 C), temperature source Oral, resp. rate 18, height 5\' 9"  (1.753 m), weight 91.3 kg (201 lb 4.5 oz), SpO2 100 %. PHYSICAL EXAMINATION:  Physical Exam  Constitutional: He is oriented to person, place, and time.  HENT:  Head: Normocephalic.  Eyes: Pupils are equal, round, and reactive to light. No scleral icterus.  Neck: Neck supple. No JVD present. No tracheal deviation present.  Cardiovascular: Normal rate, regular rhythm and normal heart sounds. Exam reveals no gallop.  No murmur heard. Pulmonary/Chest: He has no wheezes. He has no rales.  Crackles  Abdominal: Soft. He exhibits no distension. There is no tenderness. There is no rebound.  Musculoskeletal: He  exhibits no edema or tenderness.  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  Neurological: He is alert and oriented to person, place, and time. No cranial nerve deficit or sensory deficit. He exhibits normal muscle tone.  Skin: No rash noted. No erythema.  Psychiatric: He has a normal mood and affect.   LABORATORY PANEL:  Male CBC Recent Labs  Lab 08/08/17 0442  WBC 8.4  HGB 9.9*  HCT 29.6*  PLT 236   ------------------------------------------------------------------------------------------------------------------ Chemistries  Recent Labs  Lab 08/05/17 1152  08/08/17 0442 08/09/17 1050  NA 137   < > 140 139  K 4.1   < > 3.3* 3.0*  CL 100*   < > 106 105  CO2 29   < > 25 27  GLUCOSE 233*   < > 102* 160*  BUN 15   < > 12 12  CREATININE 0.85   < > 0.94 0.95  CALCIUM 8.3*   < > 7.7* 7.5*  MG  --    < > 1.7  --   AST 25  --   --   --   ALT 30  --   --   --   ALKPHOS 217*  --   --   --   BILITOT 0.8  --   --   --    < > = values in this interval not displayed.   RADIOLOGY:  No results found. ASSESSMENT AND PLAN:  The patient was admitted for left foot infection.  * Cardiac arrest and respiratory failure. S/p CPR and intubation.  Continue ventilation and CXR. Follow up Dr. Lonn Georgia. Cardiology consult appreciated  Extubated now.  *  Hypotension. BP was low side, now 117/85. Left foot infection with osteomyelitis.  Zosyn and vancomycin pharmacy to dose. I&D planned by Podiary once stable.   * Ac on ch systolic CHF- EF 96%   IV lasix cont, monitor I/o   Also have severe high Right side pressure, manage per cardio.  * PVCs and tachycardia   Increased coreg.  * PAD. Plan for vascular angiogram- once medically stable.  * Hypertension.  started back on coreg and lisinopril. * Hypomagnesemia.  Given IV magnesium and follow-up level. * Diabetes.  Continue sliding scale.  Hold metformin.  * Anemia of chronic disease. Stable Hb. * Altered mental status    Pt have poor  insight, have a legal guardian.  All the records are reviewed and case discussed with Care Management/Social Worker. Management plans discussed with the patient, family and they are in agreement.  CODE STATUS: Full Code  TOTAL TIME TAKING CARE OF THIS PATIENT: 35 minutes.  More than 50% of the time was spent in counseling/coordination of care: YES  POSSIBLE D/C IN ? DAYS, DEPENDING ON CLINICAL CONDITION.   Altamese Dilling M.D on 08/09/2017 at 5:23 PM  Between 7am to 6pm - Pager - [phone]  After 6pm go to [email or web address] - Therapist, nutritional Hospitalists

## 2017-08-09 NOTE — Progress Notes (Signed)
CH provided prayer for patient. CH rounding on unit. CH available upon request.

## 2017-08-09 NOTE — Progress Notes (Signed)
Daily Progress Note   Subjective  - 3 Days Post-Op  F/u left foot infection.    Objective Vitals:   08/08/17 1228 08/08/17 1800 08/09/17 0356 08/09/17 0358  BP: (!) 127/92 (!) 152/88 (!) 154/108 (!) 153/95  Pulse: 74 83 (!) 119 89  Resp: 16 18    Temp: 97.9 F (36.6 C) 97.9 F (36.6 C) 97.7 F (36.5 C)   TempSrc: Oral Oral Oral   SpO2: 98% 98% 100%   Weight:      Height:        Physical Exam: Wound stable.  Still with abscess and osteomyelitis left foot.  Laboratory CBC    Component Value Date/Time   WBC 8.4 08/08/2017 0442   HGB 9.9 (L) 08/08/2017 0442   HCT 29.6 (L) 08/08/2017 0442   PLT 236 08/08/2017 0442    BMET    Component Value Date/Time   NA 139 08/09/2017 1050   K 3.0 (L) 08/09/2017 1050   CL 105 08/09/2017 1050   CO2 27 08/09/2017 1050   GLUCOSE 160 (H) 08/09/2017 1050   BUN 12 08/09/2017 1050   CREATININE 0.95 08/09/2017 1050   CREATININE 1.43 (H) 09/21/2012 1150   CALCIUM 7.5 (L) 08/09/2017 1050   GFRNONAA >60 08/09/2017 1050   GFRAA >60 08/09/2017 1050    Assessment/Planning: Osteomyelitis left foot with abscess on MRI   Will plan for OR tomorrow.  D/W medicine and they will contact Cardiology   NPO after midnight tonight.  D/W pt and daughter plan for surgery.  Will need debridement with amputation left 4th and 5th toes.  Gwyneth Revels A  08/09/2017, 5:31 PM

## 2017-08-09 NOTE — Consult Note (Signed)
[facility] Infectious Disease     Reason for Consult: Foot infection   Referring Physician: Dolores Frame Date of Admission:  08/05/2017   Active Problems:   Left foot infection   HPI: Miguel Harvey is a 72 y.o. male admitted with L foot infection and osteomyelitis. 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 He has a  past medical history of coronary artery disease, dyslipidemia, tobacco dependence, diabetes mellitus, hypertension, diastolic heart failure. On admit wbc 10, no fevers. MRI showed multiple abscesses and osteomyelitis of the little toe and fifth metatarsal head, as well as the base of the fourth proximal phalanx. Since admit he has had a PEA arrest and was in unit but now out of unit and stabilized. Will need surgery.  Wound cx pending but with mixed GPC and GPR.   Past Medical History:  Diagnosis Date  . CHF (congestive heart failure) ([place])   . Diabetes mellitus   . Hypertension    Past Surgical History:  Procedure Laterality Date  . AMPUTATION Right 07/02/2017   Procedure: RIGHT BELOW KNEE AMPUTATION;  Surgeon: Newt Minion, MD;  Location: [place];  Service: Orthopedics;  Laterality: Right;  . cyst removal from  right leg about 6 years ago    . LEFT HEART CATHETERIZATION WITH CORONARY ANGIOGRAM N/A 05/11/2011   Procedure: LEFT HEART CATHETERIZATION WITH CORONARY ANGIOGRAM;  Surgeon: Troy Sine, MD;  Location: [facility] CATH LAB;  Service: Cardiovascular;  Laterality: N/A;   Social History   Tobacco Use  . Smoking status: Current Every Day Smoker    Packs/day: 2.00    Years: 40.00    Pack years: 80.00    Types: Cigarettes  . Smokeless tobacco: Former Systems developer    Quit date: 05/07/2011  Substance Use Topics  . Alcohol use: Yes    Alcohol/week: 3.6 oz    Types: 6 Cans of beer per week  . Drug use: Not on file   Family History  Problem Relation Age of Onset  . CAD Other   . Diabetes Other     Allergies: No Known Allergies  Current antibiotics: Antibiotics Given (last 72  hours)    Date/Time Action Medication Dose Rate   08/06/17 2213 New Bag/Given   piperacillin-tazobactam (ZOSYN) IVPB 3.375 g 3.375 g 12.5 mL/hr   08/06/17 2255 New Bag/Given   vancomycin (VANCOCIN) 1,500 mg in sodium chloride 0.9 % 500 mL IVPB 1,500 mg 250 mL/hr   08/07/17 0538 New Bag/Given   piperacillin-tazobactam (ZOSYN) IVPB 3.375 g 3.375 g 12.5 mL/hr   08/07/17 1410 New Bag/Given   piperacillin-tazobactam (ZOSYN) IVPB 3.375 g 3.375 g 12.5 mL/hr   08/07/17 1818 New Bag/Given   vancomycin (VANCOCIN) 1,500 mg in sodium chloride 0.9 % 500 mL IVPB 1,500 mg 250 mL/hr   08/07/17 2201 New Bag/Given   piperacillin-tazobactam (ZOSYN) IVPB 3.375 g 3.375 g 12.5 mL/hr   08/08/17 0553 New Bag/Given   piperacillin-tazobactam (ZOSYN) IVPB 3.375 g 3.375 g 12.5 mL/hr   08/08/17 1152 New Bag/Given   vancomycin (VANCOCIN) 1,500 mg in sodium chloride 0.9 % 500 mL IVPB 1,500 mg 250 mL/hr   08/08/17 1403 New Bag/Given   piperacillin-tazobactam (ZOSYN) IVPB 3.375 g 3.375 g 12.5 mL/hr   08/08/17 2350 New Bag/Given   piperacillin-tazobactam (ZOSYN) IVPB 3.375 g 3.375 g 12.5 mL/hr   08/09/17 0620 New Bag/Given   piperacillin-tazobactam (ZOSYN) IVPB 3.375 g 3.375 g 12.5 mL/hr   08/09/17 0620 New Bag/Given   vancomycin (VANCOCIN) 1,500 mg in  sodium chloride 0.9 % 500 mL IVPB 1,500 mg 250 mL/hr   08/09/17 1416 New Bag/Given   piperacillin-tazobactam (ZOSYN) IVPB 3.375 g 3.375 g 12.5 mL/hr      MEDICATIONS: . atorvastatin  40 mg Oral QPM  . carvedilol  25 mg Oral BID WC  . enoxaparin (LOVENOX) injection  40 mg Subcutaneous Q24H  . furosemide  40 mg Intravenous Daily  . insulin aspart  0-5 Units Subcutaneous QHS  . insulin aspart  0-9 Units Subcutaneous TID WC  . lisinopril  10 mg Oral Daily  . potassium chloride  40 mEq Oral TID  . sodium chloride flush  10-40 mL Intracatheter Q12H  . tamsulosin  0.4 mg Oral Daily    Review of Systems - unable to obtina  OBJECTIVE: Temp:  [97.7 F (36.5  C)-97.9 F (36.6 C)] 97.7 F (36.5 C) (05/13 0356) Pulse Rate:  [83-119] 89 (05/13 0358) Resp:  [18] 18 (05/12 1800) BP: (152-154)/(88-108) 153/95 (05/13 0358) SpO2:  [98 %-100 %] 100 % (05/13 0356) Physical Exam  Constitutional: chronically ill and disheveled, unable to provide any history  HENT: anicteric  Mouth/Throat: Oropharynx is clear and moist.poor dentition Cardiovascular: Normal rate, regular rhythm and normal heart sounds.  Pulmonary/Chest: Effort normal and breath sounds normal. No respiratory distress. He has no wheezes.  Abdominal: Soft. Bowel sounds are normal. He exhibits no distension. There is no tenderness.  Lymphadenopathy:  He has no cervical adenopathy.  Neurological: He is alert and oriented to person, place, and time.  Ext - R BKA site well coapted.  Skin L foot with necrosis over lateral toes and foot. Area of swellign on plantar surface  Psychiatric: He has a normal mood and affect. His behavior is normal.     LABS: Results for orders placed or performed during the hospital encounter of 08/05/17 (from the past 48 hour(s))  Glucose, capillary     Status: Abnormal   Collection Time: 08/07/17  4:25 PM  Result Value Ref Range   Glucose-Capillary 105 (H) 65 - 99 mg/dL  Glucose, capillary     Status: None   Collection Time: 08/07/17  8:08 PM  Result Value Ref Range   Glucose-Capillary 96 65 - 99 mg/dL  Glucose, capillary     Status: Abnormal   Collection Time: 08/07/17 10:08 PM  Result Value Ref Range   Glucose-Capillary 102 (H) 65 - 99 mg/dL  Glucose, capillary     Status: None   Collection Time: 08/07/17 11:17 PM  Result Value Ref Range   Glucose-Capillary 90 65 - 99 mg/dL  Glucose, capillary     Status: None   Collection Time: 08/08/17  4:06 AM  Result Value Ref Range   Glucose-Capillary 91 65 - 99 mg/dL  CBC     Status: Abnormal   Collection Time: 08/08/17  4:42 AM  Result Value Ref Range   WBC 8.4 3.8 - 10.6 K/uL   RBC 3.52 (L) 4.40 - 5.90  MIL/uL   Hemoglobin 9.9 (L) 13.0 - 18.0 g/dL   HCT 29.6 (L) 40.0 - 52.0 %   MCV 83.9 80.0 - 100.0 fL   MCH 28.1 26.0 - 34.0 pg   MCHC 33.5 32.0 - 36.0 g/dL   RDW 16.0 (H) 11.5 - 14.5 %   Platelets 236 150 - 440 K/uL    Comment: Performed at [facility], [address], [place], [place] [address]  Basic metabolic panel     Status: Abnormal   Collection Time: 08/08/17  4:42 AM  Result Value Ref Range   Sodium 140 135 - 145 mmol/L   Potassium 3.3 (L) 3.5 - 5.1 mmol/L   Chloride 106 101 - 111 mmol/L   CO2 25 22 - 32 mmol/L   Glucose, Bld 102 (H) 65 - 99 mg/dL   BUN 12 6 - 20 mg/dL   Creatinine, Ser 0.94 0.61 - 1.24 mg/dL   Calcium 7.7 (L) 8.9 - 10.3 mg/dL   GFR calc non Af Amer >60 >60 mL/min   GFR calc Af Amer >60 >60 mL/min    Comment: (NOTE) The eGFR has been calculated using the CKD EPI equation. This calculation has not been validated in all clinical situations. eGFR's persistently <60 mL/min signify possible Chronic Kidney Disease.    Anion gap 9 5 - 15    Comment: Performed at [facility], [address] [place], [place] [address]  Magnesium     Status: None   Collection Time: 08/08/17  4:42 AM  Result Value Ref Range   Magnesium 1.7 1.7 - 2.4 mg/dL    Comment: Performed at [facility], [address] [place], [place] [address]  Phosphorus     Status: None   Collection Time: 08/08/17  4:42 AM  Result Value Ref Range   Phosphorus 3.4 2.5 - 4.6 mg/dL    Comment: Performed at [facility], [address] [place], [place] [address]  Glucose, capillary     Status: Abnormal   Collection Time: 08/08/17  8:19 AM  Result Value Ref Range   Glucose-Capillary 102 (H) 65 - 99 mg/dL  Glucose, capillary     Status: None   Collection Time: 08/08/17 11:56 AM  Result Value Ref Range   Glucose-Capillary 97 65 - 99 mg/dL  Glucose, capillary     Status: Abnormal   Collection Time: 08/08/17  4:46 PM  Result Value Ref Range    Glucose-Capillary 161 (H) 65 - 99 mg/dL  Glucose, capillary     Status: Abnormal   Collection Time: 08/08/17  9:33 PM  Result Value Ref Range   Glucose-Capillary 165 (H) 65 - 99 mg/dL  Glucose, capillary     Status: Abnormal   Collection Time: 08/09/17  7:37 AM  Result Value Ref Range   Glucose-Capillary 188 (H) 65 - 99 mg/dL  TSH     Status: Abnormal   Collection Time: 08/09/17  7:42 AM  Result Value Ref Range   TSH 9.524 (H) 0.350 - 4.500 uIU/mL    Comment: Performed by a 3rd Generation assay with a functional sensitivity of <=0.01 uIU/mL. Performed at [facility], [address] [place], [place] [address]   Triglycerides     Status: None   Collection Time: 08/09/17 10:50 AM  Result Value Ref Range   Triglycerides 79 <150 mg/dL    Comment: Performed at [facility], [address] [place], [place] [address]  Basic metabolic panel     Status: Abnormal   Collection Time: 08/09/17 10:50 AM  Result Value Ref Range   Sodium 139 135 - 145 mmol/L   Potassium 3.0 (L) 3.5 - 5.1 mmol/L   Chloride 105 101 - 111 mmol/L   CO2 27 22 - 32 mmol/L   Glucose, Bld 160 (H) 65 - 99 mg/dL   BUN 12 6 - 20 mg/dL   Creatinine, Ser 0.95 0.61 - 1.24 mg/dL   Calcium 7.5 (L) 8.9 - 10.3 mg/dL   GFR calc non Af Amer >60 >60  mL/min   GFR calc Af Amer >60 >60 mL/min    Comment: (NOTE) The eGFR has been calculated using the CKD EPI equation. This calculation has not been validated in all clinical situations. eGFR's persistently <60 mL/min signify possible Chronic Kidney Disease.    Anion gap 7 5 - 15    Comment: Performed at [facility], [address] [place], [place] [address]  Glucose, capillary     Status: Abnormal   Collection Time: 08/09/17 11:08 AM  Result Value Ref Range   Glucose-Capillary 137 (H) 65 - 99 mg/dL   No components found for: ESR, C REACTIVE PROTEIN MICRO: Recent Results (from the past 720 hour(s))  Aerobic/Anaerobic Culture (surgical/deep  wound)     Status: None   Collection Time: 07/31/17  3:17 PM  Result Value Ref Range Status   Specimen Description   Final    TOE LEFT Performed at [facility], [address], [place], [place] [address]    Special Requests NONE  Final   Gram Stain   Final    RARE WBC PRESENT, PREDOMINANTLY PMN ABUNDANT GRAM POSITIVE COCCI IN PAIRS    Culture   Final    ABUNDANT VIRIDANS STREPTOCOCCUS NO ANAEROBES ISOLATED Performed at [facility], [place] [address] [place], [place] [address]    Report Status 08/06/2017 FINAL  Final   Organism ID, Bacteria VIRIDANS STREPTOCOCCUS  Final      Susceptibility   Viridans streptococcus - MIC*    PENICILLIN <=0.06 SENSITIVE Sensitive     CEFTRIAXONE 0.25 SENSITIVE Sensitive     ERYTHROMYCIN <=0.12 SENSITIVE Sensitive     LEVOFLOXACIN 0.5 SENSITIVE Sensitive     VANCOMYCIN 0.25 SENSITIVE Sensitive     * ABUNDANT VIRIDANS STREPTOCOCCUS  MRSA PCR Screening     Status: None   Collection Time: 08/05/17  5:17 PM  Result Value Ref Range Status   MRSA by PCR NEGATIVE NEGATIVE Final    Comment:        The GeneXpert MRSA Assay (FDA approved for NASAL specimens only), is one component of a comprehensive MRSA colonization surveillance program. It is not intended to diagnose MRSA infection nor to guide or monitor treatment for MRSA infections. Performed at [facility], [address] [place], [place] [address]   Aerobic/Anaerobic Culture (surgical/deep wound)     Status: None (Preliminary result)   Collection Time: 08/06/17  2:42 PM  Result Value Ref Range Status   Specimen Description   Final    FOOT LEFT Performed at [facility], [address] [place], [place] [address]    Special Requests   Final    Immunocompromised Performed at [facility], [place], [place] [address]    Gram Stain   Final    FEW WBC PRESENT,BOTH PMN AND MONONUCLEAR FEW GRAM POSITIVE COCCI RARE GRAM POSITIVE  RODS Performed at [facility], [place] [address], [place], [place] [address]    Culture   Final    CULTURE REINCUBATED FOR BETTER GROWTH NO ANAEROBES ISOLATED; CULTURE IN PROGRESS FOR 5 DAYS    Report Status PENDING  Incomplete    IMAGING: Dg Abd 1 View  Result Date: 08/06/2017 CLINICAL DATA:  Orogastric tube placement. EXAM: ABDOMEN - 1 VIEW COMPARISON:  07/02/2017. FINDINGS: Interval mildly dilated, gas-filled loops of colon and small bowel. Orogastric tube tip in the proximal stomach. Thoracolumbar spine degenerative changes and mild scoliosis. IMPRESSION: 1. Orogastric tube tip in the proximal  stomach. 2. Interval mild colonic and small bowel ileus or partial obstruction. Electronically Signed   By: Claudie Revering M.D.   On: 08/06/2017 13:52   Ct Head Wo Contrast  Result Date: 08/06/2017 CLINICAL DATA:  Altered level of consciousness. EXAM: CT HEAD WITHOUT CONTRAST TECHNIQUE: Contiguous axial images were obtained from the base of the skull through the vertex without intravenous contrast. COMPARISON:  CT scan of July 02, 2017. FINDINGS: Brain: Mild chronic ischemic white matter disease is noted. No mass effect or midline shift is noted. Ventricular size is within normal limits. There is no evidence of mass lesion, hemorrhage or acute infarction. Vascular: No mass effect or midline shift is noted. Ventricular size is within normal limits. There is no evidence of mass lesion, hemorrhage or acute infarction. Skull: Normal. Negative for fracture or focal lesion. Sinuses/Orbits: No acute finding. Other: None. IMPRESSION: Mild chronic ischemic white matter disease. No acute intracranial abnormality seen. Electronically Signed   By: Marijo Conception, M.D.   On: 08/06/2017 13:30   Mr Foot Left Wo Contrast  Result Date: 08/06/2017 CLINICAL DATA:  Left fifth toe osteomyelitis. EXAM: MRI OF THE LEFT FOOT WITHOUT CONTRAST TECHNIQUE: Multiplanar, multisequence MR imaging of the left forefoot was  performed. No intravenous contrast was administered. COMPARISON:  Left foot x-rays from yesterday. FINDINGS: Bones/Joint/Cartilage Increased marrow edema with osseous destruction and decreased T1 marrow signal involving the fifth metatarsal head, consistent with osteomyelitis. Near complete osseous destruction of the little toe phalanges. There is a angulated, pathologic fracture through the neck of the fifth metatarsal. Bone marrow edema extends throughout the fifth metatarsal shaft with preserved T1 marrow signal, likely reactive. There is also abnormal marrow edema involving the fourth proximal phalanx. Although the T1 marrow signal is relatively preserved, there is probable early cortical destruction along the lateral base of the fourth proximal phalanx. Ligaments The fifth MTP collateral ligaments are likely eroded. Remaining toe collateral ligaments are intact. Lisfranc ligament is intact. Muscles and Tendons Flexor, peroneal and extensor compartment tendons are intact. No tenosynovitis. Increased T2 signal and atrophy within the intrinsic muscles of the forefoot, nonspecific, but likely related to diabetic muscle changes. Soft tissue There are multiple fluid collections in the plantar and dorsal soft tissues of the lateral forefoot. The dorsal fluid collection overlying the fourth and fifth distal metatarsals measures up to 6.9 cm in AP dimension. Smaller 2.4 cm fluid collection along the lateral aspect of the fifth metatarsal head. Slightly more ill-defined 5.7 cm fluid collection in the plantar soft tissues involving the plantar aponeurosis. Ill-defined fluid extends into the plantar aspect of the second webspace and base of the second and third toes. IMPRESSION: 1. 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 2. Multiple abscesses in the plantar and dorsal soft tissues of the lateral forefoot as described above. Electronically Signed   By:  Titus Dubin M.D.   On: 08/06/2017 08:27   Dg Chest Port 1 View  Result Date: 08/06/2017 CLINICAL DATA:  Intubated.  Smoker. EXAM: PORTABLE CHEST 1 VIEW COMPARISON:  07/02/2017. FINDINGS: Endotracheal tube in satisfactory position. Orogastric tube extending into the stomach. Stable enlarged cardiac silhouette. Decreased prominence of the pulmonary vasculature and interstitial markings. No pleural fluid seen. Mild thoracic spine degenerative changes. IMPRESSION: Stable cardiomegaly with mildly improved mild changes of congestive heart failure. Electronically Signed   By: Claudie Revering M.D.   On: 08/06/2017 13:54   Dg Foot Complete  Left  Result Date: 08/05/2017 CLINICAL DATA:  LEFT foot infection, prior RIGHT below-knee amputation fourth and fifth toe necrosis with fluctuance at plantar aspect question infection/abscess EXAM: LEFT FOOT - COMPLETE 3+ VIEW COMPARISON:  06/28/2017 FINDINGS: Diffuse osseous demineralization. Progressive bone destruction involving the base of the proximal phalanx of the LEFT fifth toe. New bone destruction involving the head and neck of the LEFT fifth metatarsal consistent with progressive osteomyelitis, involving the fifth MTP joint. Diffuse osteolysis of the phalanges of the LEFT fifth toe. Remain joint spaces preserved. No additional fracture, dislocation or bone destruction. IMPRESSION: Progressive bone destruction involving the proximal phalanx of the LEFT fifth toe with new bone destruction of the head and neck of the LEFT fifth metatarsal consistent with progressive osteomyelitis. Electronically Signed   By: Lavonia Dana M.D.   On: 08/05/2017 15:18    Assessment:   NAZEER ROMNEY is a 72 y.o. male with poorly controlled DM with PN and PAD s/p recent R BKA now with L foot infection with osteo noted on MRI as well as soft tissue abscesses. He has had a complicated hospitalization with PEA arrest bit has seen podiatry and vascular. WIll need Angio and debridement of foot.  Cultures pending.   Recommendations Cont vanco and zosyn pending culture results, angiogram and further surgery.  Thank you very much for allowing me to participate in the care of this patient. Please call with questions.   Cheral Marker. Ola Spurr, MD

## 2017-08-10 ENCOUNTER — Encounter: Payer: Self-pay | Admitting: *Deleted

## 2017-08-10 ENCOUNTER — Ambulatory Visit (INDEPENDENT_AMBULATORY_CARE_PROVIDER_SITE_OTHER): Payer: Medicare Other | Admitting: Orthopedic Surgery

## 2017-08-10 ENCOUNTER — Encounter
Admission: AD | Disposition: A | Discharge status: Skilled Nursing Facility | Payer: Self-pay | Source: Ambulatory Visit | Attending: Internal Medicine

## 2017-08-10 ENCOUNTER — Inpatient Hospital Stay: Payer: Medicare Other | Admitting: Anesthesiology

## 2017-08-10 HISTORY — PX: IRRIGATION AND DEBRIDEMENT FOOT: SHX6602

## 2017-08-10 LAB — BASIC METABOLIC PANEL
Anion gap: 7 (ref 5–15)
BUN: 12 mg/dL (ref 6–20)
CHLORIDE: 105 mmol/L (ref 101–111)
CO2: 29 mmol/L (ref 22–32)
CREATININE: 1.1 mg/dL (ref 0.61–1.24)
Calcium: 7.9 mg/dL — ABNORMAL LOW (ref 8.9–10.3)
GFR calc Af Amer: >60 mL/min (ref >60)
GFR calc non Af Amer: >60 mL/min (ref >60)
Glucose, Bld: 204 mg/dL — ABNORMAL HIGH (ref 65–99)
POTASSIUM: 3.6 mmol/L (ref 3.5–5.1)
Sodium: 141 mmol/L (ref 135–145)

## 2017-08-10 LAB — GLUCOSE, CAPILLARY
GLUCOSE-CAPILLARY: 162 mg/dL — AB (ref 65–99)
GLUCOSE-CAPILLARY: 137 mg/dL — AB (ref 65–99)
GLUCOSE-CAPILLARY: 125 mg/dL — AB (ref 65–99)
GLUCOSE-CAPILLARY: 223 mg/dL — AB (ref 65–99)
Glucose-Capillary: 148 mg/dL — ABNORMAL HIGH (ref 65–99)

## 2017-08-10 LAB — VANCOMYCIN, TROUGH: Vancomycin Tr: 22 ug/mL (ref 15–20)

## 2017-08-10 SURGERY — IRRIGATION AND DEBRIDEMENT FOOT
Anesthesia: Monitor Anesthesia Care | Laterality: Left | Wound class: Dirty or Infected

## 2017-08-10 MED ORDER — FENTANYL CITRATE (PF) 100 MCG/2ML IJ SOLN
25.0000 ug | INTRAMUSCULAR | Status: DC | PRN
Start: 2017-08-10 — End: 2017-08-10

## 2017-08-10 MED ORDER — SODIUM CHLORIDE 0.9 % IV SOLN
INTRAVENOUS | Status: DC
  Administered 2017-08-10: 12:00:00 via INTRAVENOUS

## 2017-08-10 MED ORDER — EPINEPHRINE PF 1 MG/ML IJ SOLN
INTRAMUSCULAR | Status: AC
  Filled 2017-08-10: qty 1

## 2017-08-10 MED ORDER — MIDAZOLAM HCL 2 MG/2ML IJ SOLN
INTRAMUSCULAR | Status: AC
  Filled 2017-08-10: qty 2

## 2017-08-10 MED ORDER — ROPIVACAINE HCL 5 MG/ML IJ SOLN
INTRAMUSCULAR | Status: AC
  Filled 2017-08-10: qty 30

## 2017-08-10 MED ORDER — SODIUM CHLORIDE 0.9 % IV SOLN
1500.0000 mg | INTRAVENOUS | Status: DC
  Administered 2017-08-10 – 2017-08-13 (×4): 1500 mg via INTRAVENOUS
  Filled 2017-08-10 (×4): qty 1500

## 2017-08-10 MED ORDER — HEPARIN SOD (PORK) LOCK FLUSH 10 UNIT/ML IV SOLN
10.0000 [IU] | Freq: Once | INTRAVENOUS | Status: AC
  Administered 2017-08-11: 10 [IU]
  Filled 2017-08-10: qty 1

## 2017-08-10 MED ORDER — LIDOCAINE-EPINEPHRINE 1 %-1:100000 IJ SOLN
INTRAMUSCULAR | Status: AC
  Filled 2017-08-10: qty 1

## 2017-08-10 MED ORDER — LIDOCAINE HCL (PF) 1 % IJ SOLN
INTRAMUSCULAR | Status: AC
  Filled 2017-08-10: qty 5

## 2017-08-10 MED ORDER — POTASSIUM CHLORIDE 10 MEQ/50ML IV SOLN
10.0000 meq | INTRAVENOUS | Status: AC
  Administered 2017-08-10 (×3): 10 meq via INTRAVENOUS
  Filled 2017-08-10 (×4): qty 50

## 2017-08-10 MED ORDER — OXYCODONE-ACETAMINOPHEN 5-325 MG PO TABS
1.0000 | ORAL_TABLET | ORAL | Status: DC | PRN
  Administered 2017-08-10 – 2017-08-13 (×5): 1 via ORAL
  Filled 2017-08-10 (×5): qty 1

## 2017-08-10 MED ORDER — BUPIVACAINE HCL (PF) 0.5 % IJ SOLN
INTRAMUSCULAR | Status: AC
  Filled 2017-08-10: qty 30

## 2017-08-10 MED ORDER — DEXMEDETOMIDINE HCL IN NACL 200 MCG/50ML IV SOLN
INTRAVENOUS | Status: DC | PRN
  Administered 2017-08-10: 0.4 ug/kg/h via INTRAVENOUS

## 2017-08-10 MED ORDER — LIDOCAINE HCL 1 % IJ SOLN
INTRAMUSCULAR | Status: DC | PRN
  Administered 2017-08-10: 10 mL

## 2017-08-10 MED ORDER — LIDOCAINE HCL (PF) 1 % IJ SOLN
INTRAMUSCULAR | Status: AC
  Filled 2017-08-10: qty 30

## 2017-08-10 MED ORDER — MIDAZOLAM HCL 2 MG/2ML IJ SOLN
INTRAMUSCULAR | Status: DC | PRN
  Administered 2017-08-10 (×2): 1 mg via INTRAVENOUS

## 2017-08-10 MED ORDER — BUPIVACAINE HCL 0.5 % IJ SOLN
INTRAMUSCULAR | Status: DC | PRN
  Administered 2017-08-10: 10 mL

## 2017-08-10 MED ORDER — LIDOCAINE-EPINEPHRINE 1 %-1:100000 IJ SOLN
INTRAMUSCULAR | Status: DC | PRN
  Administered 2017-08-10: 10 mL

## 2017-08-10 MED ORDER — ONDANSETRON HCL 4 MG/2ML IJ SOLN: 4.0000 mg | Freq: Once | INTRAMUSCULAR | Status: DC | PRN

## 2017-08-10 SURGICAL SUPPLY — 64 items
BANDAGE ACE 4X5 VEL STRL LF (GAUZE/BANDAGES/DRESSINGS) ×2 IMPLANT
BLADE OSC/SAGITTAL MD 5.5X18 (BLADE) ×1 IMPLANT
BLADE OSCILLATING/SAGITTAL (BLADE)
BLADE SW THK.38XMED LNG THN (BLADE) IMPLANT
BNDG CMPR 75X21 PLY HI ABS (MISCELLANEOUS) ×1
BNDG COHESIVE 4X5 TAN STRL (GAUZE/BANDAGES/DRESSINGS) ×2 IMPLANT
BNDG COHESIVE 6X5 TAN STRL LF (GAUZE/BANDAGES/DRESSINGS) ×2 IMPLANT
BNDG CONFORM 3 STRL LF (GAUZE/BANDAGES/DRESSINGS) ×2 IMPLANT
BNDG ESMARK 4X12 TAN STRL LF (GAUZE/BANDAGES/DRESSINGS) ×1 IMPLANT
BNDG GAUZE 4.5X4.1 6PLY STRL (MISCELLANEOUS) ×2 IMPLANT
CANISTER SUCT 1200ML W/VALVE (MISCELLANEOUS) ×2 IMPLANT
CANISTER SUCT 3000ML PPV (MISCELLANEOUS) ×2 IMPLANT
CUFF TOURN 18 STER (MISCELLANEOUS) ×2 IMPLANT
CUFF TOURN DUAL PL 12 NO SLV (MISCELLANEOUS) ×1 IMPLANT
DRAPE FLUOR MINI C-ARM 54X84 (DRAPES) ×1 IMPLANT
DRAPE XRAY CASSETTE 23X24 (DRAPES) IMPLANT
DRESSING ALLEVYN 4X4 (MISCELLANEOUS) IMPLANT
DURAPREP 26ML APPLICATOR (WOUND CARE) ×2 IMPLANT
ELECT REM PT RETURN 9FT ADLT (ELECTROSURGICAL) ×2
ELECTRODE REM PT RTRN 9FT ADLT (ELECTROSURGICAL) ×1 IMPLANT
GAUZE PACKING 1/4 X5 YD (GAUZE/BANDAGES/DRESSINGS) ×2 IMPLANT
GAUZE PACKING IODOFORM 1X5 (MISCELLANEOUS) ×2 IMPLANT
GAUZE PETRO XEROFOAM 1X8 (MISCELLANEOUS) ×2 IMPLANT
GAUZE SPONGE 4X4 12PLY STRL (GAUZE/BANDAGES/DRESSINGS) ×2 IMPLANT
GAUZE STRETCH 2X75IN STRL (MISCELLANEOUS) ×2 IMPLANT
GLOVE BIO SURGEON STRL SZ7.5 (GLOVE) ×2 IMPLANT
GLOVE INDICATOR 8.0 STRL GRN (GLOVE) ×2 IMPLANT
GOWN STRL REUS W/ TWL LRG LVL3 (GOWN DISPOSABLE) ×2 IMPLANT
GOWN STRL REUS W/TWL LRG LVL3 (GOWN DISPOSABLE) ×2
GOWN STRL REUS W/TWL MED LVL3 (GOWN DISPOSABLE) ×3 IMPLANT
HANDPIECE VERSAJET DEBRIDEMENT (MISCELLANEOUS) ×1 IMPLANT
IV NS 1000ML (IV SOLUTION) ×2
IV NS 1000ML BAXH (IV SOLUTION) ×1 IMPLANT
KIT TURNOVER KIT A (KITS) ×2 IMPLANT
LABEL OR SOLS (LABEL) ×2 IMPLANT
NDL FILTER BLUNT 18X1 1/2 (NEEDLE) ×1 IMPLANT
NDL HYPO 25X1 1.5 SAFETY (NEEDLE) ×1 IMPLANT
NEEDLE FILTER BLUNT 18X 1/2SAF (NEEDLE) ×1
NEEDLE FILTER BLUNT 18X1 1/2 (NEEDLE) ×1 IMPLANT
NEEDLE HYPO 25X1 1.5 SAFETY (NEEDLE) ×2 IMPLANT
NS IRRIG 500ML POUR BTL (IV SOLUTION) ×2 IMPLANT
PACK EXTREMITY ARMC (MISCELLANEOUS) ×2 IMPLANT
PAD ABD DERMACEA PRESS 5X9 (GAUZE/BANDAGES/DRESSINGS) ×2 IMPLANT
PAD ONESTEP ZOLL R SERIES ADT (MISCELLANEOUS) ×1 IMPLANT
PULSAVAC PLUS IRRIG FAN TIP (DISPOSABLE) ×2
RASP SM TEAR CROSS CUT (RASP) IMPLANT
SHIELD FULL FACE ANTIFOG 7M (MISCELLANEOUS) ×1 IMPLANT
SOL .9 NS 3000ML IRR  AL (IV SOLUTION)
SOL .9 NS 3000ML IRR AL (IV SOLUTION)
SOL .9 NS 3000ML IRR UROMATIC (IV SOLUTION) ×1 IMPLANT
SOL PREP PVP 2OZ (MISCELLANEOUS) ×2
SOLUTION PREP PVP 2OZ (MISCELLANEOUS) ×1 IMPLANT
STOCKINETTE IMPERVIOUS 9X36 MD (GAUZE/BANDAGES/DRESSINGS) ×2 IMPLANT
SUT ETHILON 2 0 FS 18 (SUTURE) ×4 IMPLANT
SUT ETHILON 4-0 (SUTURE) ×2
SUT ETHILON 4-0 FS2 18XMFL BLK (SUTURE) ×1
SUT VIC AB 3-0 SH 27 (SUTURE) ×2
SUT VIC AB 3-0 SH 27X BRD (SUTURE) ×1 IMPLANT
SUT VIC AB 4-0 FS2 27 (SUTURE) ×2 IMPLANT
SUTURE ETHLN 4-0 FS2 18XMF BLK (SUTURE) ×1 IMPLANT
SWAB CULTURE AMIES ANAERIB BLU (MISCELLANEOUS) ×1 IMPLANT
SYR 10ML LL (SYRINGE) ×4 IMPLANT
SYR 3ML LL SCALE MARK (SYRINGE) ×2 IMPLANT
TIP FAN IRRIG PULSAVAC PLUS (DISPOSABLE) ×1 IMPLANT

## 2017-08-10 NOTE — Pre-Procedure Instructions (Signed)
Dr Ether Griffins made decision to do ankle block in room. 12:12 Taken to OR.

## 2017-08-10 NOTE — Anesthesia Post-op Follow-up Note (Signed)
Anesthesia QCDR form completed.        

## 2017-08-10 NOTE — Progress Notes (Signed)
SLP Cancellation Note  Patient Details Name: Miguel Harvey MRN: [id number] DOB: [birth year]   Cancelled treatment:       Reason Eval/Treat Not Completed: Patient at procedure or test/unavailable(chart reviewed. Pt is NPO for surgery today per MD notes.). ST services will f/u w/ pt's status post his surgery when able to have oral intake again.    Jerilynn Som, MS, CCC-SLP Miguel Harvey 08/10/2017, 8:45 AM

## 2017-08-10 NOTE — Transfer of Care (Signed)
Immediate Anesthesia Transfer of Care Note  Patient: Miguel Harvey  Procedure(s) Performed: IRRIGATION AND DEBRIDEMENT FOOT (Left )  Patient Location: PACU  Anesthesia Type:MAC  Level of Consciousness: awake  Airway & Oxygen Therapy: Patient connected to nasal cannula oxygen  Post-op Assessment: Post -op Vital signs reviewed and stable  Post vital signs: stable  Last Vitals:  Vitals Value Taken Time  BP 124/71 08/10/2017  1:29 PM  Temp    Pulse 56 08/10/2017  1:29 PM  Resp 19 08/10/2017  1:29 PM  SpO2 97 % 08/10/2017  1:29 PM    Last Pain:  Vitals:   08/10/17 1210  TempSrc:   PainSc: Asleep         Complications: No apparent anesthesia complications

## 2017-08-10 NOTE — Consult Note (Signed)
Pharmacy Antibiotic Note  Miguel Harvey is a 72 y.o. male admitted on 08/05/2017 with Left foot infection. According to podiatry note, patient has PVD with necrotic tissue left forefoot with abscess. Patient is S/P BKA on right side and at high risk for BKA on left side . Pharmacy has been consulted for Zosyn and Vancomycin dosing.  Plan: 5/10 VT: 28- level is supratherapeutic. NEW Kinetics: Ke: 0.053   VD:63.9   T1/2: 13.08   Will adjust dose to Vancomycin 1500mg  IV Q18hr for goal trough of 15-20. Calculated trough at Css is 15. Trough level ordered prior to 4th dose.   Continue Zosyn 3.375 IV EI every 8 hours.   Height: 5\' 9"  (175.3 cm) Weight: 201 lb 4.5 oz (91.3 kg) IBW/kg (Calculated) : 70.7  Temp (24hrs), Avg:98.1 F (36.7 C), Min:97.7 F (36.5 C), Max:98.4 F (36.9 C)  Recent Labs  Lab 08/05/17 1158 08/06/17 0506 08/06/17 1206 08/06/17 2159 08/07/17 0431 08/07/17 0925 08/08/17 0442 08/09/17 1050 08/10/17 0005  WBC 10.0 8.2 8.6  --  7.1  --  8.4  --   --   CREATININE  --  0.80 0.83 0.73 0.75  --  0.94 0.95  --   VANCOTROUGH  --   --   --   --   --  28*  --   --  22*    Estimated Creatinine Clearance: 78.4 mL/min (by C-G formula based on SCr of 0.95 mg/dL).    No Known Allergies  Antimicrobials this admission: 5/9 Zosyn >>  5/9 Vancomycin >>   Dose adjustments this admission: 5/10 Vancomycin 1500 Q12H changed to Q18 hours  5/14 0000 vanc level 22. Changed to 1500 mg q 24 hours. Level before 4th new dose.  Microbiology results: 5/9 MRSA PCR: negative   Thank you for allowing pharmacy to be a part of this patient's care.  Erich Montane, PharmD, BCPS Clinical Pharmacist 08/10/2017 1:07 AM

## 2017-08-10 NOTE — Progress Notes (Addendum)
Patient becoming  agitated throughout the night. Attempting to get out of bed, cursing, yelling and trying to hit staff. Nursing staff attempted to redirect patient however all efforts have been proven unsuccessful. 0.5 mg ativan given. Patient seems to have an increased work of breathing, oxygen saturations are reading 80%. However I am unsure of how accurate this reading is considering that patient will not be still long enough to get a thorough reading. Patient also refusing to wear telemetry monitor. MD Pras made aware. Per MD, leave patient alone and no new orders provided. Will continue to monitor.    Update 0700: This RN attempted to give 0600 IV antibiotics. Patient swinging at nursing staff and refusing to allow this RN to get close to his IV sites. Vancomycin order moved to 0800. Dayshift RN, Tammy made aware. She will continue to monitor.  Mayra Neer M

## 2017-08-10 NOTE — Progress Notes (Signed)
Dr. Asencion Islam called me, they will try to do pt's angiogram tomorrow or the day after as he tolerated podiatry procedure today.

## 2017-08-10 NOTE — Op Note (Signed)
Operative note   Surgeon:Torre Pikus [employer] logistics/support/administrative officer: None    Preop diagnosis: Osteomyelitis left fourth and fifth toes with diffuse abscess dorsal and plantar left foot    Postop diagnosis: Osteomyelitis left fourth and fifth toes with necrosis of second and third toes.  Abscess dorsal left foot and abscess plantar left foot    Procedure: 1.  Partial fifth ray amputation of second third fourth and fifth metatarsals.  Incision and drainage of plantar abscess left foot separate incision    EBL: 20 mL's    Anesthesia:local and IV sedation.  Local consisted of mixture of 1% lidocaine plain and 0.5% bupivacaine and an ankle block fashion.  The incision sites were infiltrated with lidocaine with epinephrine.  A total of 10 cc was used of each medication.    Hemostasis: Lidocaine with epinephrine    Specimen: Deep wound space abscess culture and fourth and fifth toe amputations with metatarsals.    Complications: None    Operative indications:Miguel Harvey is an 72 y.o. that presents today for surgical intervention.  The risks/benefits/alternatives/complications have been discussed and consent has been given.    Procedure:  Patient was brought into the OR and placed on the operating table in thesupine position. After anesthesia was obtained theleft lower extremity was prepped and draped in usual sterile fashion.  Attention was directed to the dorsal lateral left foot where a longitudinal incision was made along the fifth metatarsal encompassing the fourth and fifth toes at the metatarsophalangeal joint region.  This time dissection was taken to back along the dorsal aspect of the fourth metatarsal and the proximal aspect of the fifth metatarsal.  At this time osteotomy was created at the base of the fifth metatarsal and surgical neck of the fourth metatarsal.  All bone and tissue was then removed from the surgical field in toto.  At this time there was noted to be extensive necrosis of the entire  plantar aspect of the second and third toes with worsening infection.  At this time I elected to amputate the second and third toes at the metatarsophalangeal joints.  Excisional debridement with the versa jet was performed down to bone.  Wounds were flushed with copious amounts of irrigation.  There was noted to be a large dorsal abscess of the midfoot overlying the fourth and third metatarsal region.  This was opened and drained.  This was cleaned with the versa jet.  Plantarly there was a noted abscessed area that prior to a separate incision.  A small 2 cm incision was performed.  This was opened into the subtenons tissue down to the fascial layer.  Purulent drainage was noted in removed.  The wound in this region was also cleansed and debrided with the versa jet.  Attempted closure of the wound was performed but along the second and third metatarsal heads I was unable to close the wound and this time I elected to excise the distal aspect of the second and third metatarsal heads at the surgical neck.  Final flush of the wound was performed with copious amounts of irrigation.  Closure of the wound distally along the irritation size was performed with a 2 oh.  A central area was left open and packed along the dorsal abscessed region site.  The plantar wound proximal and distal aspect of the incision was closed with a 2-0 nylon and packed with deformed packing.  A large bulky sterile dressing was applied.  Prior to final closure all bleeders were  Bovie cauterized.  Bulky sterile dressing applied with compression with an Ace wrap.  Patient did tolerate the procedure and anesthesia well.    Patient tolerated the procedure and anesthesia well.  Was transported from the OR to the PACU with all vital signs stable and vascular status intact. To be discharged per routine protocol.

## 2017-08-10 NOTE — Progress Notes (Signed)
RN placed a IV team consult s/p procedure 2/2  lumens clotted off on femoral central line. IV team informed RN that they do not de-clot temporary catheters. I will continue to assess.

## 2017-08-10 NOTE — Progress Notes (Signed)
Spoke to Dr. Gilda Crease and discussed intra-op findings of good flow to skin flaps.  I would recommend delaying vascular intervention given his recent PEA with anesthesia.  If there are signs of non-healing of the skin flaps can consider angio late this week or early next week.  Dr Gilda Crease in agreement to hold on angio for now with intervention in future if needed.  I will f/u tomorrow.

## 2017-08-10 NOTE — Progress Notes (Signed)
Progress Note  Patient Name: Miguel Harvey Date of Encounter: 08/10/2017  Primary Cardiologist: new to [facility]  Subjective   Some agitation overnight per nursing Ativan did not seem to work too well Sleeping this morning but arousable  hypoxia, oxygen nasal cannula started Chronic congestion heard with breathing  Continues to receive IV 40 Lasix daily with good urine output Suspect -10 L or more this admission icu numbers diuresis not documented on recent flow chart numbers   on his carvedilol and lisinopril blood pressure stable No significant arrhythmia noted  Inpatient Medications    Scheduled Meds: . atorvastatin  40 mg Oral QPM  . carvedilol  25 mg Oral BID WC  . chlorhexidine  60 mL Topical Once  . enoxaparin (LOVENOX) injection  40 mg Subcutaneous Q24H  . furosemide  40 mg Intravenous Daily  . insulin aspart  0-5 Units Subcutaneous QHS  . insulin aspart  0-9 Units Subcutaneous TID WC  . lisinopril  10 mg Oral Daily  . potassium chloride  40 mEq Oral TID  . sodium chloride flush  10-40 mL Intracatheter Q12H  . tamsulosin  0.4 mg Oral Daily   Continuous Infusions: . piperacillin-tazobactam (ZOSYN)  IV Stopped (08/10/17 0141)  . vancomycin 1,500 mg (08/10/17 0735)   PRN Meds: acetaminophen **OR** acetaminophen, albuterol, bisacodyl, gadobenate dimeglumine, LORazepam, oxyCODONE-acetaminophen, senna-docusate, sodium chloride flush   Vital Signs    Vitals:   08/09/17 0358 08/09/17 1959 08/10/17 0300 08/10/17 0755  BP: (!) 153/95 (!) 135/112  (!) 131/100  Pulse: 89 72  77  Resp:  18  19  Temp:  98.4 F (36.9 C)  98.2 F (36.8 C)  TempSrc:    Oral  SpO2:  100%  99%  Weight:   182 lb 8 oz (82.8 kg)   Height:        Intake/Output Summary (Last 24 hours) at 08/10/2017 0835 Last data filed at 08/10/2017 0645 Gross per 24 hour  Intake 50 ml  Output 2900 ml  Net -2850 ml   Filed Weights   08/05/17 1145 08/10/17 0300  Weight: 201 lb 4.5 oz (91.3 kg) 182 lb  8 oz (82.8 kg)    Telemetry    Normal sinus rhythm - Personally Reviewed  ECG     - Personally Reviewed  Physical Exam   GEN:  Supine in bed, no distress, sleeping Neck: unable to estimate JVD Cardiac: RRR, no murmurs, rubs, or gallops.  Respiratory:moderately decreased breath sounds throughout, Rales at the bases GI: Soft, nontender, non-distended  MS: No edema; No deformity.  amputation right lower extremity Left foot in bandage, gangrenous toes noted Neuro:  Nonfocal , full exam not performed Psych: Sleepy this morning but arousable, and able to converse  Labs    Chemistry Recent Labs  Lab 08/05/17 1152  08/08/17 0442 08/09/17 1050 08/10/17 0005  NA 137   < > 140 139 141  K 4.1   < > 3.3* 3.0* 3.6  CL 100*   < > 106 105 105  CO2 29   < > 25 27 29   GLUCOSE 233*   < > 102* 160* 204*  BUN 15   < > 12 12 12   CREATININE 0.85   < > 0.94 0.95 1.10  CALCIUM 8.3*   < > 7.7* 7.5* 7.9*  PROT 6.8  --   --   --   --   ALBUMIN 2.4*  --   --   --   --  AST 25  --   --   --   --   ALT 30  --   --   --   --   ALKPHOS 217*  --   --   --   --   BILITOT 0.8  --   --   --   --   GFRNONAA >60   < > >60 >60 >60  GFRAA >60   < > >60 >60 >60  ANIONGAP 8   < > 9 7 7    < > = values in this interval not displayed.     Hematology Recent Labs  Lab 08/06/17 1206 08/07/17 0431 08/08/17 0442  WBC 8.6 7.1 8.4  RBC 3.62* 3.46* 3.52*  HGB 10.0* 9.6* 9.9*  HCT 30.9* 29.1* 29.6*  MCV 85.4 84.2 83.9  MCH 27.7 27.8 28.1  MCHC 32.4 33.0 33.5  RDW 16.0* 15.8* 16.0*  PLT 285 229 236    Cardiac Enzymes Recent Labs  Lab 08/06/17 1206 08/06/17 1854 08/07/17 0431  TROPONINI 0.03* 0.05* 0.04*   No results for input(s): TROPIPOC in the last 168 hours.   BNPNo results for input(s): BNP, PROBNP in the last 168 hours.   DDimer No results for input(s): DDIMER in the last 168 hours.   Radiology    No results found.  Cardiac Studies   Echocardiogram - Left ventricle: The cavity  size was mildly dilated. There was   mild concentric hypertrophy. Systolic function was severely   reduced. The estimated ejection fraction was in the range of 20%   to 25%. Diffuse hypokinesis. Regional wall motion abnormalities   cannot be excluded. The study is not technically sufficient to   allow evaluation of LV diastolic function. - Mitral valve: There was moderate regurgitation. - Left atrium: The atrium was mildly dilated. - Right ventricle: The cavity size was mildly dilated. Wall   thickness was normal. Systolic function was mildly reduced. - Right atrium: The atrium was mildly dilated. - Tricuspid valve: There was moderate regurgitation. - Pulmonary arteries: Systolic pressure was severely elevated. PA   peak pressure: 68 mm Hg (S).  Patient Profile     Miguel Harvey is a 72 y.o. male with a hx of coronary artery disease, cardiomyopathy ejection fraction 25% on echocardiogram, smoker hyperlipidemia, diabetes, hypertension, presenting to the hospital with diabetic foot ulcer/infection, Has prior below-knee amputation right lower extremity one month ago in [place] Long-standing ulceration left foot with no healing Does not ambulate much at baseline Started on Zosyn and vancomycin, Was being prepped for debridement left foot with Dr. Ether Griffins PEA arrest    Assessment & Plan     1. PEA arrest Etiology unclear,  possibly respiratory arrest, secondary to congestive heart failure  prior to anesthesia induction while patient was being prepped for surgery.  Unable to exclude arrhythmia during the prep though no documentation  No plan for cardiac catheterization  Prior cardiac catheterization 5 to 6 years ago with known nonischemic heart myopathy no significant coronary disease --- Significant diuresis since that time, back on his heart failure regiment carvedilol ACE inhibitor  2.  Nonischemic cardiomyopathy Low ejection fraction 25% on echocardiogram Cardiopathydating  back to 2013, cardiac catheterization nonischemic Markedly elevated right heart pressures on recent echocardiogram Continue carvedilol lisinopril diuretics  3.  Osteomyelitis left foot On broad-spectrum antibiotics Given his comorbidities listed above,  Certainly is high risk for surgery  As to the timing of when procedure should take place on his left foot,  there will likely be no optimal time as he has a long history of cardiomyopathy He has had significant diuresis and appears hemodynamically stable without significant arrhythmia on telemetry --Additional aggressive diuresis may be of little clinical benefit in getting him ready for surgery --Would recommend we proceed with surgery given the extent of his osteomyelitis, minimize IV fluids. Will defer type of anesthesia to the surgical team to minimize further risk  4.Diabetes:  long history of poor control diabetes Hemoglobin A1c 9.5   Case discussed with nursing  Total encounter time more than 35 minutes  Greater than 50% was spent in counseling and coordination of care with the patient   For questions or updates, please contact [facility] Please consult [email or web address] for contact info under Cardiology/STEMI.      Signed, Julien Nordmann, MD  08/10/2017, 8:35 AM

## 2017-08-10 NOTE — Progress Notes (Signed)
Pt returns from procedure, VSS, telemetry re applied.  RN notes 2 lumens of central line are clotted, IV team will be consulted. Left foot is wrapped. Pt had4 digits from left foot amputated, only great toe on left foot. I will continue to assess.

## 2017-08-10 NOTE — Anesthesia Postprocedure Evaluation (Signed)
Anesthesia Post Note  Patient: Miguel Harvey  Procedure(s) Performed: IRRIGATION AND DEBRIDEMENT FOOT (Left )  Patient location during evaluation: PACU Anesthesia Type: MAC Level of consciousness: awake and alert and oriented Pain management: pain level controlled Vital Signs Assessment: post-procedure vital signs reviewed and stable Respiratory status: spontaneous breathing, nonlabored ventilation and respiratory function stable Cardiovascular status: blood pressure returned to baseline and stable Postop Assessment: no signs of nausea or vomiting Anesthetic complications: no     Last Vitals:  Vitals:   08/10/17 1415 08/10/17 1437  BP: 134/79 125/83  Pulse: (!) 58 (!) 56  Resp: 14 17  Temp:    SpO2: 99% 100%    Last Pain:  Vitals:   08/10/17 1415  TempSrc:   PainSc: Asleep                 [name]

## 2017-08-10 NOTE — Progress Notes (Signed)
[employer] [facility] [place] [facility]   PATIENT NAME: Miguel Harvey    MR#:  [id number]  DATE OF BIRTH:  June 07, 1945  SUBJECTIVE:  CHIEF COMPLAINT:  No chief complaint on file. somewhat confused/lethargic. Waiting for surgery REVIEW OF SYSTEMS:  Review of Systems  Constitutional: Negative for chills and fever.  HENT: Negative for congestion, ear discharge, hearing loss and tinnitus.   Eyes: Negative for blurred vision, double vision and discharge.  Respiratory: Negative for cough, shortness of breath and wheezing.   Cardiovascular: Negative for chest pain, palpitations and leg swelling.  Gastrointestinal: Negative for abdominal pain, blood in stool, nausea and vomiting.  Genitourinary: Negative for dysuria and urgency.  Musculoskeletal: Negative for back pain.  Skin: Negative for rash.  Neurological: Negative for dizziness, tremors and focal weakness.  Endo/Heme/Allergies: Does not bruise/bleed easily.  Psychiatric/Behavioral: Negative for depression.   DRUG ALLERGIES:  No Known Allergies VITALS:  Blood pressure 116/75, pulse (!) 56, temperature 97.6 F (36.4 C), resp. rate 13, height 5\' 9"  (1.753 m), weight 82.8 kg (182 lb 8 oz), SpO2 98 %. PHYSICAL EXAMINATION:  Physical Exam  Constitutional: He is oriented to person, place, and time.  HENT:  Head: Normocephalic.  Eyes: Pupils are equal, round, and reactive to light. No scleral icterus.  Neck: Neck supple. No JVD present. No tracheal deviation present.  Cardiovascular: Normal rate, regular rhythm and normal heart sounds. Exam reveals no gallop.  No murmur heard. Pulmonary/Chest: He has no wheezes. He has no rales.  Crackles  Abdominal: Soft. He exhibits no distension. There is no tenderness. There is no rebound.  Musculoskeletal: He exhibits no edema or tenderness.  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  Neurological: He is alert and oriented to person, place, and time. No cranial nerve deficit or sensory  deficit. He exhibits normal muscle tone.  Skin: No rash noted. No erythema.  Psychiatric: He has a normal mood and affect.   LABORATORY PANEL:  Male CBC Recent Labs  Lab 08/08/17 0442  WBC 8.4  HGB 9.9*  HCT 29.6*  PLT 236   ------------------------------------------------------------------------------------------------------------------ Chemistries  Recent Labs  Lab 08/05/17 1152  08/08/17 0442  08/10/17 0005  NA 137   < > 140   < > 141  K 4.1   < > 3.3*   < > 3.6  CL 100*   < > 106   < > 105  CO2 29   < > 25   < > 29  GLUCOSE 233*   < > 102*   < > 204*  BUN 15   < > 12   < > 12  CREATININE 0.85   < > 0.94   < > 1.10  CALCIUM 8.3*   < > 7.7*   < > 7.9*  MG  --    < > 1.7  --   --   AST 25  --   --   --   --   ALT 30  --   --   --   --   ALKPHOS 217*  --   --   --   --   BILITOT 0.8  --   --   --   --    < > = values in this interval not displayed.   RADIOLOGY:  No results found. ASSESSMENT AND PLAN:  The patient was admitted for left foot infection.  * Cardiac arrest and respiratory failure. S/p CPR and intubation-extubation - on n.c. O2  now  * Hypotension. BP better now. Left foot infection with osteomyelitis.  Zosyn and vancomycin pharmacy to dose. 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  Incision and drainage of plantar abscess left foot separate incision  * Ac on ch systolic CHF- EF 45%   IV lasix cont, monitor I/o   Also have severe high Right side pressure, manage per cardio.  * PVCs and tachycardia  - continue coreg.  * PAD. Plan for vascular angiogram- once medically stable.  * Hypertension: continue coreg and lisinopril.  * Hypomagnesemia.  Given IV magnesium and follow-up level.  * Diabetes.  Continue sliding scale.  Hold metformin.  * Anemia of chronic disease. Stable Hb.  * Altered mental status    Pt have poor insight, have a legal guardian.   Patient comes from [facility] - will  consider goals of care conversations and see if he would be willing to go back home with [facility], PT   All the records are reviewed and case discussed with Care Management/Social Worker. Management plans discussed with the patient, Dr Lewie Loron and they are in agreement.  CODE STATUS: Full Code  TOTAL TIME TAKING CARE OF THIS PATIENT: 35 minutes.  More than 50% of the time was spent in counseling/coordination of care: YES  POSSIBLE D/C IN 2-3 DAYS, DEPENDING ON CLINICAL CONDITION.   Delfino Lovett M.D on 08/10/2017 at 2:07 PM  Between 7am to 6pm - Pager - [phone]  After 6pm go to [email or web address] - Therapist, nutritional Hospitalists

## 2017-08-10 NOTE — Anesthesia Preprocedure Evaluation (Signed)
Anesthesia Evaluation  Patient identified by MRN, date of birth, ID band Patient confused    Reviewed: Allergy & Precautions, NPO status , Patient's Chart, lab work & pertinent test results  History of Anesthesia Complications Negative for: history of anesthetic complications  Airway Mallampati: III       Dental  (+) Chipped, Missing, Poor Dentition   Pulmonary neg sleep apnea, neg COPD, Current Smoker,           Cardiovascular hypertension, Pt. on medications +CHF (Severe PHTN, LVEF 20-25%)  (-) Past MI (-) dysrhythmias (-) Valvular Problems/Murmurs     Neuro/Psych neg Seizures PT agitated last night. Given ativan for sedation. Pt sedated mildly difficult to arouse. Confused and combative on arousal.   GI/Hepatic Neg liver ROS, neg GERD  ,  Endo/Other  diabetes, Type 2, Oral Hypoglycemic Agents  Renal/GU negative Renal ROS     Musculoskeletal   Abdominal   Peds  Hematology   Anesthesia Other Findings   Reproductive/Obstetrics                             Anesthesia Physical Anesthesia Plan  ASA: IV  Anesthesia Plan: MAC   Post-op Pain Management:    Induction: Intravenous  PONV Risk Score and Plan:   Airway Management Planned: Nasal Cannula  Additional Equipment:   Intra-op Plan:   Post-operative Plan:   Informed Consent: I have reviewed the patients History and Physical, chart, labs and discussed the procedure including the risks, benefits and alternatives for the proposed anesthesia with the patient or authorized representative who has indicated his/her understanding and acceptance.     Plan Discussed with:   Anesthesia Plan Comments: (Spoke with surgeon who states that procedure needs to be done. Cardiologist states pt condition will not improve. Will proceed with minimal sedation with versed/precedex. Surgeon will do local ankle block. Pt high risk, but procedure needs  to be done.)        Anesthesia Quick Evaluation

## 2017-08-10 NOTE — Progress Notes (Signed)
15 minute call to floor. 

## 2017-08-11 ENCOUNTER — Encounter
Admission: AD | Disposition: A | Discharge status: Skilled Nursing Facility | Payer: Self-pay | Source: Ambulatory Visit | Attending: Internal Medicine

## 2017-08-11 ENCOUNTER — Encounter: Payer: Self-pay | Admitting: Podiatry

## 2017-08-11 ENCOUNTER — Inpatient Hospital Stay: Payer: Self-pay

## 2017-08-11 DIAGNOSIS — M86472 Chronic osteomyelitis with draining sinus, left ankle and foot: Secondary | ICD-10-CM

## 2017-08-11 LAB — AEROBIC/ANAEROBIC CULTURE W GRAM STAIN (SURGICAL/DEEP WOUND)

## 2017-08-11 LAB — GLUCOSE, CAPILLARY
GLUCOSE-CAPILLARY: 138 mg/dL — AB (ref 65–99)
GLUCOSE-CAPILLARY: 112 mg/dL — AB (ref 65–99)
GLUCOSE-CAPILLARY: 150 mg/dL — AB (ref 65–99)
Glucose-Capillary: 154 mg/dL — ABNORMAL HIGH (ref 65–99)

## 2017-08-11 LAB — URINALYSIS, COMPLETE (UACMP) WITH MICROSCOPIC
BACTERIA UA: NONE SEEN
BILIRUBIN URINE: NEGATIVE
Glucose, UA: NEGATIVE mg/dL
KETONES UR: NEGATIVE mg/dL
NITRITE: NEGATIVE
PROTEIN: 30 mg/dL — AB
RBC / HPF: >50 RBC/hpf — ABNORMAL HIGH (ref 0–5)
Specific Gravity, Urine: 1.025 (ref 1.005–1.030)
pH: 5 (ref 5.0–8.0)

## 2017-08-11 SURGERY — LOWER EXTREMITY ANGIOGRAPHY
Anesthesia: Moderate Sedation | Laterality: Left

## 2017-08-11 MED ORDER — OCUVITE-LUTEIN PO CAPS
1.0000 | ORAL_CAPSULE | Freq: Every day | ORAL | Status: DC
  Administered 2017-08-11 – 2017-08-14 (×4): 1 via ORAL
  Filled 2017-08-11 (×4): qty 1

## 2017-08-11 MED ORDER — SPIRONOLACTONE 25 MG PO TABS
25.0000 mg | ORAL_TABLET | Freq: Every day | ORAL | Status: DC
  Administered 2017-08-11 – 2017-08-14 (×4): 25 mg via ORAL
  Filled 2017-08-11 (×4): qty 1

## 2017-08-11 MED ORDER — PREMIER PROTEIN SHAKE
11.0000 [oz_av] | Freq: Two times a day (BID) | ORAL | Status: DC
  Administered 2017-08-11 – 2017-08-14 (×3): 11 [oz_av] via ORAL

## 2017-08-11 NOTE — [place] FL2 (Addendum)
[place] MEDICAID FL2 LEVEL OF CARE SCREENING TOOL     IDENTIFICATION  Patient Name: Miguel Harvey Birthdate: [birth year] Sex: male Admission Date (Current Location): 08/05/2017  [facility] [place] Number:  Haynes Bast Pending [phone] T   Facility and Address:  [facility], [address], [place], [place] [address]      Provider Number: [id number]  Attending Physician Name and Address:  Delfino Lovett, MD  Relative Name and Phone Number:  Eliberto Ivory Daughter [phone]     Current Level of Care: Hospital Recommended Level of Care: Skilled Nursing Facility Prior Approval Number:    Date Approved/Denied:   PASRR Number: [id number]  Discharge Plan: SNF    Current Diagnoses: Patient Active Problem List   Diagnosis Date Noted  . Left foot infection 08/05/2017  . Pressure injury of skin 06/29/2017  . Sepsis (HCC) 06/28/2017  . Diabetic foot infection (HCC) 06/28/2017  . Elevated troponin 06/28/2017  . Pulmonary nodule 07/01/2011  . Acute diastolic heart failure, 2D EF 27-07% with grade 2 diastloic dysfunction 2011-06-02  . Family history of early CAD, brother died 81 CAD 06-02-11  . Dyslipidemia Jun 02, 2011  . NICM, minor CAD at cath, EF 25% at cath, 55% by 2D June 02, 2011  . Diabetes mellitus (HCC) 05/07/2011  . HTN (hypertension), malignant 05/07/2011  . TOBACCO DEPENDENCE 05/27/2006    Orientation RESPIRATION BLADDER Height & Weight     Self  Normal Incontinent Weight: 194 lb 4.8 oz (88.1 kg) Height:  5\' 9"  (175.3 cm)  BEHAVIORAL SYMPTOMS/MOOD NEUROLOGICAL BOWEL NUTRITION STATUS      Incontinent Diet(Heart Healthy Carb Modified)  AMBULATORY STATUS COMMUNICATION OF NEEDS Skin   Extensive Assist Verbally Surgical wounds                       Personal Care Assistance Level of Assistance  Bathing, Feeding, Dressing Bathing Assistance: Limited assistance Feeding assistance: Limited assistance Dressing Assistance: Limited  assistance     Functional Limitations Info  Sight, Hearing, Speech Sight Info: Adequate Hearing Info: Adequate Speech Info: Adequate    SPECIAL CARE FACTORS FREQUENCY  PT (By licensed PT), OT (By licensed OT)     PT Frequency: 5x a week OT Frequency: 5x a week            Contractures Contractures Info: Not present    Additional Factors Info  Code Status, Allergies, Insulin Sliding Scale Code Status Info: Full Code Allergies Info: NKA   Insulin Sliding Scale Info: insulin aspart (novoLOG) injection 0-5 Units 3x a day with meals       Current Medications (08/11/2017):  This is the current hospital active medication list Current Facility-Administered Medications  Medication Dose Route Frequency Provider Last Rate Last Dose  . acetaminophen (TYLENOL) tablet 650 mg  650 mg Oral Q6H PRN Gwyneth Revels, DPM       Or  . acetaminophen (TYLENOL) suppository 650 mg  650 mg Rectal Q6H PRN Gwyneth Revels, DPM      . albuterol (PROVENTIL) (2.5 MG/3ML) 0.083% nebulizer solution 2.5 mg  2.5 mg Nebulization Q2H PRN Gwyneth Revels, DPM   2.5 mg at 08/10/17 1117  . atorvastatin (LIPITOR) tablet 40 mg  40 mg Oral QPM Gwyneth Revels, DPM   40 mg at 08/10/17 1739  . bisacodyl (DULCOLAX) EC tablet 5 mg  5 mg Oral Daily PRN Gwyneth Revels, DPM      . carvedilol (COREG) tablet 25 mg  25 mg Oral BID WC Gwyneth Revels, DPM  25 mg at 08/11/17 0846  . enoxaparin (LOVENOX) injection 40 mg  40 mg Subcutaneous Q24H Gwyneth Revels, DPM   40 mg at 08/10/17 2300  . furosemide (LASIX) injection 40 mg  40 mg Intravenous Daily Gwyneth Revels, DPM   40 mg at 08/11/17 0946  . gadobenate dimeglumine (MULTIHANCE) injection 20 mL  20 mL Intravenous Once PRN Gwyneth Revels, DPM      . insulin aspart (novoLOG) injection 0-5 Units  0-5 Units Subcutaneous QHS Gwyneth Revels, DPM   2 Units at 08/10/17 2120  . insulin aspart (novoLOG) injection 0-9 Units  0-9 Units Subcutaneous TID WC Gwyneth Revels, DPM   2 Units at  08/11/17 1201  . lisinopril (PRINIVIL,ZESTRIL) tablet 10 mg  10 mg Oral Daily Gwyneth Revels, DPM   10 mg at 08/11/17 0947  . LORazepam (ATIVAN) injection 0.5 mg  0.5 mg Intravenous Q4H PRN Gwyneth Revels, DPM   0.5 mg at 08/10/17 0450  . multivitamin-lutein (OCUVITE-LUTEIN) capsule 1 capsule  1 capsule Oral Daily Delfino Lovett, MD   1 capsule at 08/11/17 1000  . oxyCODONE-acetaminophen (PERCOCET/ROXICET) 5-325 MG per tablet 1 tablet  1 tablet Oral Q4H PRN Oralia Manis, MD   1 tablet at 08/11/17 1054  . piperacillin-tazobactam (ZOSYN) IVPB 3.375 g  3.375 g Intravenous Q8H Gwyneth Revels, DPM   Stopped at 08/11/17 0950  . protein supplement (PREMIER PROTEIN) liquid  11 oz Oral BID BM Delfino Lovett, MD   11 oz at 08/11/17 1001  . senna-docusate (Senokot-S) tablet 1 tablet  1 tablet Oral QHS PRN Gwyneth Revels, DPM      . sodium chloride flush (NS) 0.9 % injection 10-40 mL  10-40 mL Intracatheter Q12H Gwyneth Revels, DPM   10 mL at 08/11/17 0950  . sodium chloride flush (NS) 0.9 % injection 10-40 mL  10-40 mL Intracatheter PRN Gwyneth Revels, DPM      . spironolactone (ALDACTONE) tablet 25 mg  25 mg Oral Daily Gollan, Tollie Pizza, MD      . tamsulosin (FLOMAX) capsule 0.4 mg  0.4 mg Oral Daily Gwyneth Revels, DPM   0.4 mg at 08/11/17 0947  . vancomycin (VANCOCIN) 1,500 mg in sodium chloride 0.9 % 500 mL IVPB  1,500 mg Intravenous Q24H Gwyneth Revels, DPM   Stopped at 08/11/17 2440     Discharge Medications: Please see discharge summary for a list of discharge medications.  Relevant Imaging Results:  Relevant Lab Results:   Additional Information SSN [id number]  Darleene Cleaver, [place]

## 2017-08-11 NOTE — Progress Notes (Signed)
PICC unsuccessful x 2 attempts. Pt moved continuously during procedure despite pre-med and bedside sitter, and eventually violated the sterile barrier. Procedure ended. Recommend placement by IR if central access still required. Primary RN notified.

## 2017-08-11 NOTE — Evaluation (Addendum)
Clinical/Bedside Swallow Evaluation Patient Details  Name: Miguel Harvey MRN: [id number] Date of Birth: 13-Apr-1945  Today's Date: 08/11/2017 Time: SLP Start Time (ACUTE ONLY): 0947 SLP Stop Time (ACUTE ONLY): 1047 SLP Time Calculation (min) (ACUTE ONLY): 60 min  Past Medical History:  Past Medical History:  Diagnosis Date  . CHF (congestive heart failure) (HCC)   . Diabetes mellitus   . Hypertension    Past Surgical History:  Past Surgical History:  Procedure Laterality Date  . AMPUTATION Right 07/02/2017   Procedure: RIGHT BELOW KNEE AMPUTATION;  Surgeon: Nadara Mustard, MD;  Location: [facility] OR;  Service: Orthopedics;  Laterality: Right;  . cyst removal from  right leg about 6 years ago    . IRRIGATION AND DEBRIDEMENT FOOT Left 08/10/2017   Procedure: IRRIGATION AND DEBRIDEMENT FOOT;  Surgeon: Gwyneth Revels, DPM;  Location: [facility] ORS;  Service: Podiatry;  Laterality: Left;  . LEFT HEART CATHETERIZATION WITH CORONARY ANGIOGRAM N/A 05/11/2011   Procedure: LEFT HEART CATHETERIZATION WITH CORONARY ANGIOGRAM;  Surgeon: Lennette Bihari, MD;  Location[facility] C[facility] LAB;  Service: Cardiovascular;  Laterality: N/A;   HPI:   Pt is a 72 y.o. male with a known history of hypertension, diabetes, CHF and diabetic foot with right BKA recently.  The patient got a right-sided BKA 3 weeks ago in [place].  He has been in rehab since that time.  He said he has had left foot infection for several days.  He went to Dr. Dory Larsen office and was sent to the hospital by Dr. Alberteen Spindle for direct admission due to left foot infection. Unsure of pt's baseline Cognitive status; appears declined in his behaviors/engagement. Pt requires mod cues for follow through w/ tasks; often agitated toward staff.     Assessment / Plan / Recommendation Clinical Impression  Pt appears to present w/ grossly adequate oropharyngeal phase swallow function but is impacted by his declined Cognitive status and Impulsivity during self-feeding(often  over fills his mouth).  Pt consumed po trials of thin liquids via straw, purees and soft solids often overfilling his mouth. Bite sizes were monitored; pt gave adequate attention to boluses and w/ min increased time, he cleared orally b/t trials. No overt s/s of aspiration were noted w/ any of the trial consistencies given - clear vocal quality was noted and no decline in respiratory status during/post trials. Pt helped to feed self but required monitoring.  Recommend a Dysphagia level 3 (mech soft w/ chopped meats) w/ Thin liquids. Aspiration precautions. 100% SUPERVISION during meals d/t declined Cognitive status at this time; reducing distractions during meals. Recommend Pills in Puree for safer swallowing.  SLP Visit Diagnosis: Dysphagia, oropharyngeal phase (R13.12)    Aspiration Risk  Mild aspiration risk(d/t declined Cognitive status)    Diet Recommendation  Dysphagia level 3 (well-chopped meats w/ Gravy to moisten); Thin liquids. General aspiration precautions; monitoring at meals for Supervision d/t impulsivity.   Medication Administration: Whole meds with puree(for safer swallowing)    Other  Recommendations Recommended Consults: (Dietician f/u - likes Ensure) Oral Care Recommendations: Oral care BID;Staff/trained caregiver to provide oral care Other Recommendations: (n/a)   Follow up Recommendations None      Frequency and Duration min 2x/week  1 week       Prognosis Prognosis for Safe Diet Advancement: Fair(-Good) Barriers to Reach Goals: Cognitive deficits;Behavior      Swallow Study   General Date of Onset: 08/05/17 Type of Study: Bedside Swallow Evaluation Previous Swallow Assessment: none reported Diet Prior to this  Study: Thin liquids;Regular(ordered by MD post surgery completed) Temperature Spikes Noted: No(wbc 8.4) Respiratory Status: Nasal cannula(2-4 liters) History of Recent Intubation: No Behavior/Cognition:  Cooperative;Confused;Agitated;Distractible;Requires cueing(needs redirecting, cues) Oral Cavity Assessment: (did not fully participate) Oral Care Completed by SLP: Recent completion by staff Oral Cavity - Dentition: Missing dentition Vision: Functional for self-feeding Self-Feeding Abilities: Able to feed self;Needs assist;Needs set up(impulsive) Patient Positioning: Upright in bed;Postural control adequate for testing Baseline Vocal Quality: Normal Volitional Cough: Cognitively unable to elicit Volitional Swallow: Unable to elicit    Oral/Motor/Sensory Function Overall Oral Motor/Sensory Function: Within functional limits(w/ bolus trials, clearing)   Ice Chips Ice chips: Not tested   Thin Liquid Thin Liquid: Within functional limits Presentation: Cup;Self Fed;Straw(~4 ozs)    Nectar Thick Nectar Thick Liquid: Not tested   Honey Thick Honey Thick Liquid: Not tested   Puree Puree: Not tested   Solid   GO   Solid: Impaired(mech soft foods) Presentation: Self Fed;Spoon(10 trials) Oral Phase Impairments: Impaired mastication(min increased time/effort; impulsive) Oral Phase Functional Implications: Impaired mastication;Prolonged oral transit(min increased time/effort) Pharyngeal Phase Impairments: (none) Other Comments: needed supervision d/t impulsivity        Jerilynn Som, MS, CCC-SLP Watson,Katherine 08/11/2017,10:52 AM

## 2017-08-11 NOTE — Progress Notes (Signed)
Talked to Dr. Sherryll Burger about patient's foley catheter if need to renew order, per MD need to discontinue foley and need to do a voiding trial. No other concern at the moment. RN will continue to monitor.

## 2017-08-11 NOTE — Progress Notes (Signed)
Pt bed alarm sounded and I and another staff ran to the room. Pt found sitting on the floor with his back against the bed. Pt found to be laughing and says that he was just trying to get up and go to the store. Pt has a right BKA and according to him, slid off the side of the bed into the floor when he swung his legs over the side to get out. Pt has no c/o pain resulting from the fall. Pt had stated he had pain from his earlier surgery on his left foot and I was coming out of the med room with his pain med's at the time the alarm went off. MD Anne Hahn made aware. Pt has no concerns offered.

## 2017-08-11 NOTE — Progress Notes (Signed)
Discontinue foley catheter.

## 2017-08-11 NOTE — Progress Notes (Signed)
Progress Note  Patient Name: Miguel Harvey Date of Encounter: 08/11/2017  Primary Cardiologist: new to [facility]  Subjective   Nurses report he is doing well as morning,  Ate Much of his breakfast No significant complaints Successful irrigation and debridement yesterday Might have vascular intervention later this week or early next week  Inpatient Medications    Scheduled Meds: . atorvastatin  40 mg Oral QPM  . carvedilol  25 mg Oral BID WC  . enoxaparin (LOVENOX) injection  40 mg Subcutaneous Q24H  . furosemide  40 mg Intravenous Daily  . insulin aspart  0-5 Units Subcutaneous QHS  . insulin aspart  0-9 Units Subcutaneous TID WC  . lisinopril  10 mg Oral Daily  . multivitamin-lutein  1 capsule Oral Daily  . protein supplement shake  11 oz Oral BID BM  . sodium chloride flush  10-40 mL Intracatheter Q12H  . tamsulosin  0.4 mg Oral Daily   Continuous Infusions: . piperacillin-tazobactam (ZOSYN)  IV Stopped (08/11/17 0950)  . vancomycin Stopped (08/11/17 0846)   PRN Meds: acetaminophen **OR** acetaminophen, albuterol, bisacodyl, gadobenate dimeglumine, LORazepam, oxyCODONE-acetaminophen, senna-docusate, sodium chloride flush   Vital Signs    Vitals:   08/10/17 1955 08/10/17 2330 08/11/17 0500 08/11/17 0743  BP: (!) 143/86 126/79  (!) 141/93  Pulse: 65 68  73  Resp: 18   16  Temp: 98.7 F (37.1 C) 98.2 F (36.8 C)  98.4 F (36.9 C)  TempSrc:  Oral    SpO2: 97% 97%  100%  Weight:   194 lb 4.8 oz (88.1 kg)   Height:        Intake/Output Summary (Last 24 hours) at 08/11/2017 1158 Last data filed at 08/11/2017 1019 Gross per 24 hour  Intake 2370 ml  Output -  Net 2370 ml   Filed Weights   08/05/17 1145 08/10/17 0300 08/11/17 0500  Weight: 201 lb 4.5 oz (91.3 kg) 182 lb 8 oz (82.8 kg) 194 lb 4.8 oz (88.1 kg)    Telemetry    Normal sinus rhythm - Personally Reviewed  ECG     - Personally Reviewed  Physical Exam   GEN:  awake alert no distress Neck:  unable to estimate JVD Cardiac: RRR, no murmurs, rubs, or gallops.  Respiratory:moderately decreased breath sounds throughout, Rales at the bases GI: Soft, nontender, non-distended  MS: No edema; No deformity.  amputation right lower extremity Left foot in bandage, gangrenous toes noted Neuro:  Nonfocal , full exam not performed Psych: alert, not oriented, mumbles  Labs    Chemistry Recent Labs  Lab 08/05/17 1152  08/08/17 0442 08/09/17 1050 08/10/17 0005  NA 137   < > 140 139 141  K 4.1   < > 3.3* 3.0* 3.6  CL 100*   < > 106 105 105  CO2 29   < > 25 27 29   GLUCOSE 233*   < > 102* 160* 204*  BUN 15   < > 12 12 12   CREATININE 0.85   < > 0.94 0.95 1.10  CALCIUM 8.3*   < > 7.7* 7.5* 7.9*  PROT 6.8  --   --   --   --   ALBUMIN 2.4*  --   --   --   --   AST 25  --   --   --   --   ALT 30  --   --   --   --   ALKPHOS 217*  --   --   --   --  BILITOT 0.8  --   --   --   --   GFRNONAA >60   < > >60 >60 >60  GFRAA >60   < > >60 >60 >60  ANIONGAP 8   < > 9 7 7    < > = values in this interval not displayed.     Hematology Recent Labs  Lab 08/06/17 1206 08/07/17 0431 08/08/17 0442  WBC 8.6 7.1 8.4  RBC 3.62* 3.46* 3.52*  HGB 10.0* 9.6* 9.9*  HCT 30.9* 29.1* 29.6*  MCV 85.4 84.2 83.9  MCH 27.7 27.8 28.1  MCHC 32.4 33.0 33.5  RDW 16.0* 15.8* 16.0*  PLT 285 229 236    Cardiac Enzymes Recent Labs  Lab 08/06/17 1206 08/06/17 1854 08/07/17 0431  TROPONINI 0.03* 0.05* 0.04*   No results for input(s): TROPIPOC in the last 168 hours.   BNPNo results for input(s): BNP, PROBNP in the last 168 hours.   DDimer No results for input(s): DDIMER in the last 168 hours.   Radiology    No results found.  Cardiac Studies   Echocardiogram - Left ventricle: The cavity size was mildly dilated. There was   mild concentric hypertrophy. Systolic function was severely   reduced. The estimated ejection fraction was in the range of 20%   to 25%. Diffuse hypokinesis. Regional  wall motion abnormalities   cannot be excluded. The study is not technically sufficient to   allow evaluation of LV diastolic function. - Mitral valve: There was moderate regurgitation. - Left atrium: The atrium was mildly dilated. - Right ventricle: The cavity size was mildly dilated. Wall   thickness was normal. Systolic function was mildly reduced. - Right atrium: The atrium was mildly dilated. - Tricuspid valve: There was moderate regurgitation. - Pulmonary arteries: Systolic pressure was severely elevated. PA   peak pressure: 68 mm Hg (S).  Patient Profile     Miguel Harvey is a 72 y.o. male with a hx of coronary artery disease, cardiomyopathy ejection fraction 25% on echocardiogram, smoker hyperlipidemia, diabetes, hypertension, presenting to the hospital with diabetic foot ulcer/infection, Has prior below-knee amputation right lower extremity one month ago in [place] Long-standing ulceration left foot with no healing Does not ambulate much at baseline Started on Zosyn and vancomycin, Was being prepped for debridement left foot with Dr. Ether Griffins PEA arrest    Assessment & Plan     1. PEA arrest Etiology unclear,  possibly respiratory arrest, secondary to congestive heart failure  prior to anesthesia induction while patient was being prepped for surgery.  Unable to exclude arrhythmia during the prep though no documentation  No plan for cardiac catheterization  Prior cardiac catheterization 5 to 6 years ago with known nonischemic heart myopathy no significant coronary disease continuecarvedilol ACE inhibitor  Continue IV Lasix daily  2.  Nonischemic cardiomyopathy Low ejection fraction 25% on echocardiogram Cardiopathydating back to 2013, cardiac catheterization nonischemic Markedly elevated right heart pressures on recent echocardiogram Continue carvedilol lisinopril diuretics as we have been doing Given low potassium we could add Aldactone 25 daily  3.   Osteomyelitis left foot On broad-spectrum antibiotics Completed  debridement Might have vascular procedure later this week or early next week  4.Diabetes:  long history of poor control diabetes Hemoglobin A1c 9.5 Poor diet  Case discussed with nursing  Total encounter time more than 25 minutes  Greater than 50% was spent in counseling and coordination of care with the patient   For questions or updates, please contact [facility]  Please consult [email or web address] for contact info under Cardiology/STEMI.      Signed, Julien Nordmann, MD  08/11/2017, 11:58 AM

## 2017-08-11 NOTE — Progress Notes (Signed)
Daily Progress Note   Subjective  - 1 Day Post-Op  F/u debridment left foot.  Pt with sitter.  Still confused.  Objective Vitals:   08/10/17 2330 08/11/17 0500 08/11/17 0743 08/11/17 1627  BP: 126/79  (!) 141/93 (!) 142/79  Pulse: 68  73 71  Resp:   16 16  Temp: 98.2 F (36.8 C)  98.4 F (36.9 C) 98.9 F (37.2 C)  TempSrc: Oral     SpO2: 97%  100% 100%  Weight:  88.1 kg (194 lb 4.8 oz)    Height:        Physical Exam: Wound with bloody drainage.  No purulence.  Plantar wound packed and distal wound packed.    Laboratory CBC    Component Value Date/Time   WBC 8.4 08/08/2017 0442   HGB 9.9 (L) 08/08/2017 0442   HCT 29.6 (L) 08/08/2017 0442   PLT 236 08/08/2017 0442    BMET    Component Value Date/Time   NA 141 08/10/2017 0005   K 3.6 08/10/2017 0005   CL 105 08/10/2017 0005   CO2 29 08/10/2017 0005   GLUCOSE 204 (H) 08/10/2017 0005   BUN 12 08/10/2017 0005   CREATININE 1.10 08/10/2017 0005   CREATININE 1.43 (H) 09/21/2012 1150   CALCIUM 7.9 (L) 08/10/2017 0005   GFRNONAA >60 08/10/2017 0005   GFRAA >60 08/10/2017 0005    Assessment/Planning: Osteomyelitis left foot S/P ray resections 2,3,4,5.   Dressing changed.  Will continue to follow.  Foot is warm.  May still require angio but will continue to closely monitor healing over next 1-2 days.  ID has seen.  Awaiting intra-op cultures.  Gwyneth Revels A  08/11/2017, 4:35 PM

## 2017-08-11 NOTE — Progress Notes (Signed)
[employer] - [place] [facility]   PATIENT NAME: Miguel Harvey    MR#:  [id number]  DATE OF BIRTH:  09-Mar-1946  SUBJECTIVE:  CHIEF COMPLAINT:  No chief complaint on file. somewhat confused/lethargic. Wants to eat. Sitter at bedside REVIEW OF SYSTEMS:  Review of Systems  Constitutional: Negative for chills and fever.  HENT: Negative for congestion, ear discharge, hearing loss and tinnitus.   Eyes: Negative for blurred vision, double vision and discharge.  Respiratory: Negative for cough, shortness of breath and wheezing.   Cardiovascular: Negative for chest pain, palpitations and leg swelling.  Gastrointestinal: Negative for abdominal pain, blood in stool, nausea and vomiting.  Genitourinary: Negative for dysuria and urgency.  Musculoskeletal: Negative for back pain.  Skin: Negative for rash.  Neurological: Negative for dizziness, tremors and focal weakness.  Endo/Heme/Allergies: Does not bruise/bleed easily.  Psychiatric/Behavioral: Negative for depression.   DRUG ALLERGIES:  No Known Allergies VITALS:  Blood pressure (!) 142/79, pulse 71, temperature 98.9 F (37.2 C), resp. rate 16, height 5\' 9"  (1.753 m), weight 88.1 kg (194 lb 4.8 oz), SpO2 100 %. PHYSICAL EXAMINATION:  Physical Exam  Constitutional: He is oriented to person, place, and time.  HENT:  Head: Normocephalic.  Eyes: Pupils are equal, round, and reactive to light. No scleral icterus.  Neck: Neck supple. No JVD present. No tracheal deviation present.  Cardiovascular: Normal rate, regular rhythm and normal heart sounds. Exam reveals no gallop.  No murmur heard. Pulmonary/Chest: He has no wheezes. He has no rales.  Crackles  Abdominal: Soft. He exhibits no distension. There is no tenderness. There is no rebound.  Musculoskeletal: He exhibits no edema or tenderness.  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  Neurological: He is alert and oriented to person, place, and time. No cranial nerve  deficit or sensory deficit. He exhibits normal muscle tone.  Skin: No rash noted. No erythema.  Psychiatric: He has a normal mood and affect.   LABORATORY PANEL:  Male CBC Recent Labs  Lab 08/08/17 0442  WBC 8.4  HGB 9.9*  HCT 29.6*  PLT 236   ------------------------------------------------------------------------------------------------------------------ Chemistries  Recent Labs  Lab 08/05/17 1152  08/08/17 0442  08/10/17 0005  NA 137   < > 140   < > 141  K 4.1   < > 3.3*   < > 3.6  CL 100*   < > 106   < > 105  CO2 29   < > 25   < > 29  GLUCOSE 233*   < > 102*   < > 204*  BUN 15   < > 12   < > 12  CREATININE 0.85   < > 0.94   < > 1.10  CALCIUM 8.3*   < > 7.7*   < > 7.9*  MG  --    < > 1.7  --   --   AST 25  --   --   --   --   ALT 30  --   --   --   --   ALKPHOS 217*  --   --   --   --   BILITOT 0.8  --   --   --   --    < > = values in this interval not displayed.   RADIOLOGY:  [place] Ekg Site Rite  Result Date: 08/11/2017 If Site Rite image not attached, placement could not be confirmed due to current cardiac rhythm.  ASSESSMENT AND  PLAN:  41 y m with for left foot infection.  * Cardiac arrest and respiratory failure. S/p CPR and intubation-extubation - on room air now  * Hypotension: resolved  * Left foot infection with osteomyelitis.  Zosyn and vancomycin pharmacy to dose. 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  Incision and drainage of plantar abscess left foot separate incision - pending culture results, - Vascular decided to Hold off angiogram based on Podiatry findings and discussion with them - picc and remove TLC in groin per ID  * Ac on ch systolic CHF- EF 77%   IV lasix 40 mg daily, monitor I/o   Also have severe high Right side pressure, manage per cardio.  * PVCs and tachycardia  - continue coreg.  * PAD. Plan for vascular angiogram- once medically stable likely as an outpt  * Hypertension:  continue coreg and lisinopril.  * Hypomagnesemia: repleted  * Diabetes.  Continue sliding scale.  Hold metformin.  * Anemia of chronic disease. Stable Hb.  * Altered mental status    Pt have poor insight, have a legal guardian.   Patient comes from [facility] - after long discussion with CSW - this will be a difficult transition at D/C but going back to [facility] - hopefully they can transition him soon to Appropriate NSOC.   All the records are reviewed and case discussed with Care Management/Social Worker. Management plans discussed with the patient, Dr Lewie Loron and they are in agreement.  CODE STATUS: Full Code  TOTAL TIME TAKING CARE OF THIS PATIENT: 35 minutes.  More than 50% of the time was spent in counseling/coordination of care: YES  POSSIBLE D/C IN 1-2 DAYS, DEPENDING ON CLINICAL CONDITION.   Delfino Lovett M.D on 08/11/2017 at 4:31 PM  Between 7am to 6pm - Pager - [phone]  After 6pm go to [email or web address] - Therapist, nutritional Hospitalists

## 2017-08-11 NOTE — Clinical Social Work Note (Addendum)
CSW spoke to patient's daughter Etheleen Sia and discussed discharge planning.  She would like short term rehab to transition to long term care.  CSW faxed updated clinical information to local SNFs, patient has a [facility] Pending number which is [phone].  CSW to continue to follow patient's progress throughout discharge planning.  Patient has a Comptroller which will have to be discontinued for 24 hours in order for patient to go to SNF.  Ervin Knack. Dashaun Onstott, MSW, [facility] [phone]  08/11/2017 12:50 PM

## 2017-08-11 NOTE — Progress Notes (Signed)
PICC unsuccessful after 2 attempts, patient violated sterile field. Patient was given IV ativan before the procedure to calm him down. PICC RN recommending IR to place CVC if needed. RN will continue to monitor.

## 2017-08-11 NOTE — Progress Notes (Signed)
Pt placed on low bed with mats on bilateral sides

## 2017-08-11 NOTE — Plan of Care (Signed)
  Problem: Clinical Measurements: Goal: Cardiovascular complication will be avoided Outcome: Progressing Note:  No chest pain or arrhythmias overnight   Problem: Skin Integrity: Goal: Demonstration of wound healing without infection will improve Outcome: Progressing Note:  Foot looks good, no s/s infection   Problem: Clinical Measurements: Goal: Will remain free from infection Outcome: Not Progressing Note:  Remains on IV Zosyn and Vancomycin   Problem: Elimination: Goal: Will not experience complications related to bowel motility Outcome: Not Progressing Note:  Foley remains due to aggressive diuresing   Problem: Pain Managment: Goal: General experience of comfort will improve Outcome: Not Progressing Note:  PRN medications   Problem: Safety: Goal: Ability to remain free from injury will improve Outcome: Not Progressing Note:  Pt has fall yesterday, low bed implemented today with floor mats.  Sitter at bedside.

## 2017-08-11 NOTE — Progress Notes (Signed)
[facility] INFECTIOUS DISEASE PROGRESS NOTE Date of Admission:  08/05/2017     ID: Miguel Harvey is a 72 y.o. male with DM foot infection  Active Problems:   Left foot infection   Subjective: S/p partial ray amputation of 4th and 5th MT and I and D of plantar abscess  ROS  Eleven systems are reviewed and negative except per hpi  Medications:  Antibiotics Given (last 72 hours)    Date/Time Action Medication Dose Rate   08/08/17 2350 New Bag/Given   piperacillin-tazobactam (ZOSYN) IVPB 3.375 g 3.375 g 12.5 mL/hr   08/09/17 0620 New Bag/Given   piperacillin-tazobactam (ZOSYN) IVPB 3.375 g 3.375 g 12.5 mL/hr   08/09/17 0620 New Bag/Given   vancomycin (VANCOCIN) 1,500 mg in sodium chloride 0.9 % 500 mL IVPB 1,500 mg 250 mL/hr   08/09/17 1416 New Bag/Given   piperacillin-tazobactam (ZOSYN) IVPB 3.375 g 3.375 g 12.5 mL/hr   08/09/17 2141 New Bag/Given   piperacillin-tazobactam (ZOSYN) IVPB 3.375 g 3.375 g 12.5 mL/hr   08/10/17 0735 New Bag/Given  [Patient is fighting nursing staff refusing to allow this RN to attach IV]   vancomycin (VANCOCIN) 1,500 mg in sodium chloride 0.9 % 500 mL IVPB 1,500 mg 250 mL/hr   08/10/17 2114 New Bag/Given   piperacillin-tazobactam (ZOSYN) IVPB 3.375 g 3.375 g 12.5 mL/hr   08/11/17 0612 New Bag/Given   piperacillin-tazobactam (ZOSYN) IVPB 3.375 g 3.375 g 12.5 mL/hr   08/11/17 1610 New Bag/Given   vancomycin (VANCOCIN) 1,500 mg in sodium chloride 0.9 % 500 mL IVPB 1,500 mg 250 mL/hr   08/11/17 1404 New Bag/Given   piperacillin-tazobactam (ZOSYN) IVPB 3.375 g 3.375 g 12.5 mL/hr     . atorvastatin  40 mg Oral QPM  . carvedilol  25 mg Oral BID WC  . enoxaparin (LOVENOX) injection  40 mg Subcutaneous Q24H  . furosemide  40 mg Intravenous Daily  . insulin aspart  0-5 Units Subcutaneous QHS  . insulin aspart  0-9 Units Subcutaneous TID WC  . lisinopril  10 mg Oral Daily  . multivitamin-lutein  1 capsule Oral Daily  . protein supplement shake  11 oz  Oral BID BM  . sodium chloride flush  10-40 mL Intracatheter Q12H  . spironolactone  25 mg Oral Daily  . tamsulosin  0.4 mg Oral Daily    Objective: Vital signs in last 24 hours: Temp:  [98.2 F (36.8 C)-98.7 F (37.1 C)] 98.4 F (36.9 C) (05/15 0743) Pulse Rate:  [65-73] 73 (05/15 0743) Resp:  [16-18] 16 (05/15 0743) BP: (126-143)/(79-93) 141/93 (05/15 0743) SpO2:  [97 %-100 %] 100 % (05/15 0743) FiO2 (%):  [28 %] 28 % (05/14 1637) Weight:  [88.1 kg (194 lb 4.8 oz)] 88.1 kg (194 lb 4.8 oz) (05/15 0500) Constitutional: chronically ill and disheveled, unable to provide any history  HENT: anicteric  Mouth/Throat: Oropharynx is clear and moist.poor dentition Cardiovascular: Normal rate, regular rhythm and normal heart sounds.  Pulmonary/Chest: Effort normal and breath sounds normal. No respiratory distress. He has no wheezes.  Abdominal: Soft. Bowel sounds are normal. He exhibits no distension. There is no tenderness.  Lymphadenopathy:  He has no cervical adenopathy.  Neurological: He is alert and oriented to person, place, and time.  Ext - R BKA site well coapted.  Skin L foot wrapped post op Psychiatric: He has a normal mood and affect. His behavior is normal.   Lab Results Recent Labs    08/09/17 1050 08/10/17 0005  NA 139 141  K 3.0* 3.6  CL 105 105  CO2 27 29  BUN 12 12  CREATININE 0.95 1.10    Microbiology: Results for orders placed or performed during the hospital encounter of 08/05/17  MRSA PCR Screening     Status: None   Collection Time: 08/05/17  5:17 PM  Result Value Ref Range Status   MRSA by PCR NEGATIVE NEGATIVE Final    Comment:        The GeneXpert MRSA Assay (FDA approved for NASAL specimens only), is one component of a comprehensive MRSA colonization surveillance program. It is not intended to diagnose MRSA infection nor to guide or monitor treatment for MRSA infections. Performed at [facility], [address], [place],  [place] [address]   Aerobic/Anaerobic Culture (surgical/deep wound)     Status: Abnormal   Collection Time: 08/06/17  2:42 PM  Result Value Ref Range Status   Specimen Description   Final    FOOT LEFT Performed at [facility], [address] [place], [place] [address]    Special Requests   Final    Immunocompromised Performed at [facility], [address] [place], [place] [address]    Gram Stain   Final    FEW WBC PRESENT,BOTH PMN AND MONONUCLEAR FEW GRAM POSITIVE COCCI RARE GRAM POSITIVE RODS    Culture (A)  Final    MULTIPLE ORGANISMS PRESENT, NONE PREDOMINANT NO ANAEROBES ISOLATED Performed at [facility], [address], [place], [place] [address]    Report Status 08/11/2017 FINAL  Final  Aerobic/Anaerobic Culture (surgical/deep wound)     Status: None (Preliminary result)   Collection Time: 08/10/17 12:52 PM  Result Value Ref Range Status   Specimen Description   Final    WOUND Performed at [facility], [address] [place], [place] [address]    Special Requests   Final    LEFT FOOT WOUND Performed at [facility], [address] [place], [place] [address]    Gram Stain   Final    ABUNDANT WBC PRESENT, PREDOMINANTLY PMN RARE GRAM POSITIVE COCCI    Culture   Final    TOO YOUNG TO READ Performed at [facility], [address] [place], [place] [address]    Report Status PENDING  Incomplete    Studies/Results: No results found.  Assessment/Plan: Miguel Harvey is a 72 y.o. male with poorly controlled DM with PN and PAD s/p recent R BKA now with L foot infection with osteo noted on MRI as well as soft tissue abscesses. He has had a complicated hospitalization with PEA arrest bit has seen podiatry and vascular. S/p ray amputation and I and D 5/14.  WIll need Angio at some point but on hold due to comorbidities. Cultures pending.   Recommendations Cont vanco and zosyn pending culture results, Holding on  angiogram. Place picc and remove TLC in groin asap  Thank you very much for the consult. Will follow with you.  Mick Sell   08/11/2017, 3:06 PM

## 2017-08-11 NOTE — Care Management Important Message (Signed)
Copy of signed IM left in patient's room.    

## 2017-08-11 NOTE — Progress Notes (Signed)
Per Dr. Jessie Foot, patient to have PICC line then discontinue Right femoral CVC once PICC is place. Will contact IV team. RN will continue to monitor.

## 2017-08-12 ENCOUNTER — Encounter: Payer: Self-pay | Admitting: Podiatry

## 2017-08-12 LAB — BASIC METABOLIC PANEL
Anion gap: 10 (ref 5–15)
BUN: 14 mg/dL (ref 6–20)
CALCIUM: 8.2 mg/dL — AB (ref 8.9–10.3)
CHLORIDE: 106 mmol/L (ref 101–111)
CO2: 25 mmol/L (ref 22–32)
CREATININE: 1.02 mg/dL (ref 0.61–1.24)
GFR calc Af Amer: >60 mL/min (ref >60)
GFR calc non Af Amer: >60 mL/min (ref >60)
Glucose, Bld: 157 mg/dL — ABNORMAL HIGH (ref 65–99)
Potassium: 3.2 mmol/L — ABNORMAL LOW (ref 3.5–5.1)
Sodium: 141 mmol/L (ref 135–145)

## 2017-08-12 LAB — CBC WITH DIFFERENTIAL/PLATELET
BASOS ABS: 0 10*3/uL (ref 0–0.1)
Basophils Relative: 1 %
EOS ABS: 0.1 10*3/uL (ref 0–0.7)
EOS PCT: 1 %
HCT: 28.2 % — ABNORMAL LOW (ref 40.0–52.0)
Hemoglobin: 9.2 g/dL — ABNORMAL LOW (ref 13.0–18.0)
LYMPHS ABS: 1.9 10*3/uL (ref 1.0–3.6)
LYMPHS PCT: 22 %
MCH: 27.4 pg (ref 26.0–34.0)
MCHC: 32.7 g/dL (ref 32.0–36.0)
MCV: 83.8 fL (ref 80.0–100.0)
Monocytes Absolute: 0.5 10*3/uL (ref 0.2–1.0)
Monocytes Relative: 5 %
Neutro Abs: 6.2 10*3/uL (ref 1.4–6.5)
Neutrophils Relative %: 71 %
PLATELETS: 249 10*3/uL (ref 150–440)
RBC: 3.37 MIL/uL — AB (ref 4.40–5.90)
RDW: 15.7 % — ABNORMAL HIGH (ref 11.5–14.5)
WBC: 8.8 10*3/uL (ref 3.8–10.6)

## 2017-08-12 LAB — TRIGLYCERIDES: Triglycerides: 69 mg/dL (ref <150)

## 2017-08-12 LAB — GLUCOSE, CAPILLARY
GLUCOSE-CAPILLARY: 160 mg/dL — AB (ref 65–99)
Glucose-Capillary: 143 mg/dL — ABNORMAL HIGH (ref 65–99)
Glucose-Capillary: 146 mg/dL — ABNORMAL HIGH (ref 65–99)
Glucose-Capillary: 166 mg/dL — ABNORMAL HIGH (ref 65–99)

## 2017-08-12 LAB — SURGICAL PATHOLOGY

## 2017-08-12 LAB — SEDIMENTATION RATE: Sed Rate: 65 mm/hr — ABNORMAL HIGH (ref 0–20)

## 2017-08-12 LAB — C-REACTIVE PROTEIN: CRP: 7.9 mg/dL — ABNORMAL HIGH (ref <1.0)

## 2017-08-12 MED ORDER — FUROSEMIDE 40 MG PO TABS
40.0000 mg | ORAL_TABLET | Freq: Every day | ORAL | Status: DC
  Administered 2017-08-13 – 2017-08-14 (×2): 40 mg via ORAL
  Filled 2017-08-12 (×2): qty 1

## 2017-08-12 MED ORDER — CEFAZOLIN SODIUM-DEXTROSE 2-4 GM/100ML-% IV SOLN
2.0000 g | INTRAVENOUS | Status: DC
  Filled 2017-08-12: qty 100

## 2017-08-12 MED ORDER — POTASSIUM CHLORIDE CRYS ER 20 MEQ PO TBCR
40.0000 meq | EXTENDED_RELEASE_TABLET | ORAL | Status: AC
  Administered 2017-08-12 (×2): 40 meq via ORAL
  Filled 2017-08-12 (×2): qty 2

## 2017-08-12 NOTE — Progress Notes (Signed)
Patient was In and out cath times one. Patient had his foley removed last night at 6:00 pm. Patient had not urinated since the removal of the foley. 250 ml of urine returned.

## 2017-08-12 NOTE — Progress Notes (Signed)
Discontinue sitter per Dr. Allena Katz. Patient in low bed with mats and bed alarm on. Will monitor.

## 2017-08-12 NOTE — Progress Notes (Signed)
Discussed case with primary service.  His condition has apparently stabilized and they have asked again about angiogram.  He is also in need of venous access at discharge for extended antibiotics.  We will schedule him for a left leg angiogram and a PICC line placement tomorrow.  Discussed with patient who is agreeable to proceed.

## 2017-08-12 NOTE — Progress Notes (Signed)
Pt refused chest pt.

## 2017-08-12 NOTE — Progress Notes (Signed)
Per Dr. Allena Katz, if zosyn is able to infuse through IV, discontinue femoral central line when completed. Patient to get PICC line tomorrow.

## 2017-08-12 NOTE — Progress Notes (Signed)
PT Cancellation Note  Patient Details Name: Miguel Harvey MRN: [id number] DOB: 1945-08-18   Cancelled Treatment:    Reason Eval/Treat Not Completed: Patient's level of consciousness; Per Dr. Ether Griffins please hold PT evaluation this date secondary to pt confusion.  Will attempt to see pt at a future date as medically appropriate.     Ovidio Hanger PT, DPT 08/12/17, 9:11 AM

## 2017-08-12 NOTE — Progress Notes (Signed)
CBG 143 per [place], [place]. Glucometer not synching.

## 2017-08-12 NOTE — Progress Notes (Signed)
Per Deatra James RN, CCMD called to notify of 10 beats VT. Patient asymptomatic. Ward Givens, NP on floor notified. No new orders.

## 2017-08-12 NOTE — Progress Notes (Signed)
[employer] - [facility]   PATIENT NAME: Miguel Harvey    MR#:  [id number]  DATE OF BIRTH:  Sep 21, 1945  SUBJECTIVE:  CHIEF COMPLAINT:  No chief complaint on file.  patient sitting up in bed eating lunch. No new complaints per patient Sitter at bedside REVIEW OF SYSTEMS:  Review of Systems  Constitutional: Negative for chills and fever.  HENT: Negative for congestion, ear discharge, hearing loss and tinnitus.   Eyes: Negative for blurred vision, double vision and discharge.  Respiratory: Negative for cough, shortness of breath and wheezing.   Cardiovascular: Negative for chest pain, palpitations and leg swelling.  Gastrointestinal: Negative for abdominal pain, blood in stool, nausea and vomiting.  Genitourinary: Negative for dysuria and urgency.  Musculoskeletal: Negative for back pain.  Skin: Negative for rash.  Neurological: Negative for dizziness, tremors and focal weakness.  Endo/Heme/Allergies: Does not bruise/bleed easily.  Psychiatric/Behavioral: Negative for depression.   DRUG ALLERGIES:  No Known Allergies VITALS:  Blood pressure (!) 144/97, pulse 73, temperature (!) 97.5 F (36.4 C), temperature source Oral, resp. rate 18, height 5\' 9"  (1.753 m), weight 88.9 kg (196 lb), SpO2 98 %. PHYSICAL EXAMINATION:  Physical Exam  Constitutional: He is oriented to person, place, and time.  HENT:  Head: Normocephalic.  Eyes: Pupils are equal, round, and reactive to light. No scleral icterus.  Neck: Neck supple. No JVD present. No tracheal deviation present.  Cardiovascular: Normal rate, regular rhythm and normal heart sounds. Exam reveals no gallop.  No murmur heard. Pulmonary/Chest: He has no wheezes. He has no rales.  Crackles  Abdominal: Soft. He exhibits no distension. There is no tenderness. There is no rebound.  Musculoskeletal: He exhibits no edema or tenderness.  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  Neurological: He is alert and oriented to  person, place, and time. No cranial nerve deficit or sensory deficit. He exhibits normal muscle tone.  Skin: No rash noted. No erythema.  Psychiatric: He has a normal mood and affect.   LABORATORY PANEL:  Male CBC Recent Labs  Lab 08/12/17 0427  WBC 8.8  HGB 9.2*  HCT 28.2*  PLT 249   ------------------------------------------------------------------------------------------------------------------ Chemistries  Recent Labs  Lab 08/08/17 0442  08/12/17 0427  NA 140   < > 141  K 3.3*   < > 3.2*  CL 106   < > 106  CO2 25   < > 25  GLUCOSE 102*   < > 157*  BUN 12   < > 14  CREATININE 0.94   < > 1.02  CALCIUM 7.7*   < > 8.2*  MG 1.7  --   --    < > = values in this interval not displayed.   RADIOLOGY:  No results found. ASSESSMENT AND PLAN:  81 y m with for left foot infection.  * Cardiac arrest and respiratory failure. S/p CPR and intubation-extubation - on room air now -patient in sinus rhythm  * Hypotension: resolved  * Left foot infection with osteomyelitis.  Zosyn and vancomycin pharmacy to dose. 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  Incision and drainage of plantar abscess left foot separate incision - pending culture results -discussed with Dr. Wyn Quaker vascular and podiatry Dr. Ether Griffins. Patient will get left lower extremity angiogram tomorrow along with PICC line placement for IV antibiotics. -patient is best at baseline regarding his cardiac condition-- discussed with cardiology - picc and remove TLC in groin per ID  *  Ac on ch systolic CHF- EF 16%   Po  lasix 40 mg daily, monitor I/o   Also have severe high Right side pressure, manage per cardio.  * PVCs and tachycardia  - continue coreg.  * PAD. Plan for vascular angiogram- tomorrow  * Hypertension: continue coreg and lisinopril.  * Hypomagnesemia: repleted  * Diabetes.  Continue sliding scale.  Hold metformin.  * Anemia of chronic disease. Stable  Hb.  Patient is calm and pleasant today. Will try to DC sitter today.  Patient comes from [facility] - after long discussion with CSW - this will be a difficult transition at D/C but going back to [facility] - hopefully they can transition him soon to Appropriate NSOC.   All the records are reviewed and case discussed with Care Management/Social Worker. Management plans discussed with the patient, Dr Lewie Loron and they are in agreement.  CODE STATUS: Full Code  TOTAL TIME TAKING CARE OF THIS PATIENT: 35 minutes.  More than 50% of the time was spent in counseling/coordination of care: YES  POSSIBLE D/C IN 1-2 DAYS, DEPENDING ON CLINICAL CONDITION.   Enedina Finner M.D on 08/12/2017 at 3:28 PM  Between 7am to 6pm - Pager - [phone]  After 6pm go to [email or web address] - Therapist, nutritional Hospitalists

## 2017-08-12 NOTE — Consult Note (Signed)
Pharmacy Antibiotic Note  Miguel Harvey is a 72 y.o. male admitted on 08/05/2017 with Left foot infection. According to podiatry note, patient has PVD with necrotic tissue left forefoot with abscess. Patient is S/P BKA on right side and at high risk for BKA on left side . Pharmacy has been consulted for Zosyn and Vancomycin dosing.  Plan: 5/10 VT: 28- level is supratherapeutic. NEW Kinetics: Ke: 0.053   VD:63.9   T1/2: 13.08   Will adjust dose to Vancomycin 1500mg  IV Q18hr for goal trough of 15-20. Calculated trough at Css is 15. Trough level ordered prior to 4th dose.   Continue Zosyn 3.375 IV EI every 8 hours.   Height: 5\' 9"  (175.3 cm) Weight: 196 lb (88.9 kg) IBW/kg (Calculated) : 70.7  Temp (24hrs), Avg:98.5 F (36.9 C), Min:98.1 F (36.7 C), Max:98.9 F (37.2 C)  Recent Labs  Lab 08/06/17 0506 08/06/17 1206  08/07/17 0431 08/07/17 0925 08/08/17 0442 08/09/17 1050 08/10/17 0005 08/12/17 0427  WBC 8.2 8.6  --  7.1  --  8.4  --   --  8.8  CREATININE 0.80 0.83   < > 0.75  --  0.94 0.95 1.10 1.02  VANCOTROUGH  --   --   --   --  28*  --   --  22*  --    < > = values in this interval not displayed.    Estimated Creatinine Clearance: 72.2 mL/min (by C-G formula based on SCr of 1.02 mg/dL).    No Known Allergies  Antimicrobials this admission: 5/9 Zosyn >>  5/9 Vancomycin >>   Dose adjustments this admission: 5/10 Vancomycin 1500 Q12H changed to Q18 hours  5/14 0000 vanc level 22. Changed to 1500 mg q 24 hours. Level before 4th new dose.  5/16 AM vanc given early. Trough rescheduled for 5/17 AM.  Microbiology results: 5/9 MRSA PCR: negative   Thank you for allowing pharmacy to be a part of this patient's care.  Erich Montane, PharmD, BCPS Clinical Pharmacist 08/12/2017 6:42 AM

## 2017-08-12 NOTE — Clinical Social Work Note (Addendum)
CSW spoke with patient's daughter and presented bed offers to her.  She would like patient to go to [facility].  SNF was informed and will start insurance authorization, SNF can accept patient as long as he has been without a sitter for 24 hours and insurance authorization has been received.  Patient has a Medicaid Pending number which is [id number] T through [facility], and plan is to have patient go to SNF under [facility] Medicare and transition to long term care.  CSW to continue to follow patient's progress throughout discharge planning.    Ervin Knack. Brodin Gelpi, MSW, [facility]ors [phone]  08/12/2017 4:25 PM

## 2017-08-12 NOTE — Progress Notes (Addendum)
Progress Note  Patient Name: Miguel Harvey Date of Encounter: 08/12/2017  Primary Cardiologist: Julien Nordmann, MD  Subjective   Breathing stable.  No chest or foot pain.  Inpatient Medications    Scheduled Meds: . atorvastatin  40 mg Oral QPM  . carvedilol  25 mg Oral BID WC  . enoxaparin (LOVENOX) injection  40 mg Subcutaneous Q24H  . furosemide  40 mg Intravenous Daily  . insulin aspart  0-5 Units Subcutaneous QHS  . insulin aspart  0-9 Units Subcutaneous TID WC  . lisinopril  10 mg Oral Daily  . multivitamin-lutein  1 capsule Oral Daily  . protein supplement shake  11 oz Oral BID BM  . sodium chloride flush  10-40 mL Intracatheter Q12H  . spironolactone  25 mg Oral Daily  . tamsulosin  0.4 mg Oral Daily   Continuous Infusions: . piperacillin-tazobactam (ZOSYN)  IV 3.375 g (08/12/17 0455)  . vancomycin 1,500 mg (08/12/17 0430)   PRN Meds: acetaminophen **OR** acetaminophen, albuterol, bisacodyl, gadobenate dimeglumine, LORazepam, oxyCODONE-acetaminophen, senna-docusate, sodium chloride flush   Vital Signs    Vitals:   08/11/17 1627 08/11/17 1924 08/12/17 0346 08/12/17 0735  BP: (!) 142/79 128/89 (!) 148/91 (!) 144/97  Pulse: 71 79 76 73  Resp: 16 18 18 18   Temp: 98.9 F (37.2 C) 98.4 F (36.9 C) 98.1 F (36.7 C) (!) 97.5 F (36.4 C)  TempSrc:  Oral  Oral  SpO2: 100% 91% 95% 98%  Weight:   196 lb (88.9 kg)   Height:        Intake/Output Summary (Last 24 hours) at 08/12/2017 0838 Last data filed at 08/12/2017 0700 Gross per 24 hour  Intake 820 ml  Output 2200 ml  Net -1380 ml   Filed Weights   08/10/17 0300 08/11/17 0500 08/12/17 0346  Weight: 182 lb 8 oz (82.8 kg) 194 lb 4.8 oz (88.1 kg) 196 lb (88.9 kg)    Physical Exam   GEN: Well nourished, well developed, in no acute distress.  HEENT: Grossly normal.  Neck: Supple, JVP 10-12 cm, no carotid bruits, or masses. Cardiac: RRR, distant, no murmurs, rubs, or gallops. No clubbing, cyanosis, edema.   R BKA. Left foot wrapped. Respiratory:  Respirations regular and unlabored, clear to auscultation bilaterally. GI: Soft, nontender, nondistended, BS + x 4. MS: no deformity or atrophy. Skin: warm and dry, no rash. Neuro:  Strength and sensation are intact. Psych: AAOx3.  Normal affect.  Labs    Chemistry Recent Labs  Lab 08/05/17 1152  08/09/17 1050 08/10/17 0005 08/12/17 0427  NA 137   < > 139 141 141  K 4.1   < > 3.0* 3.6 3.2*  CL 100*   < > 105 105 106  CO2 29   < > 27 29 25   GLUCOSE 233*   < > 160* 204* 157*  BUN 15   < > 12 12 14   CREATININE 0.85   < > 0.95 1.10 1.02  CALCIUM 8.3*   < > 7.5* 7.9* 8.2*  PROT 6.8  --   --   --   --   ALBUMIN 2.4*  --   --   --   --   AST 25  --   --   --   --   ALT 30  --   --   --   --   ALKPHOS 217*  --   --   --   --   BILITOT 0.8  --   --   --   --  GFRNONAA >60   < > >60 >60 >60  GFRAA >60   < > >60 >60 >60  ANIONGAP 8   < > 7 7 10    < > = values in this interval not displayed.     Hematology Recent Labs  Lab 08/07/17 0431 08/08/17 0442 08/12/17 0427  WBC 7.1 8.4 8.8  RBC 3.46* 3.52* 3.37*  HGB 9.6* 9.9* 9.2*  HCT 29.1* 29.6* 28.2*  MCV 84.2 83.9 83.8  MCH 27.8 28.1 27.4  MCHC 33.0 33.5 32.7  RDW 15.8* 16.0* 15.7*  PLT 229 236 249    Cardiac Enzymes Recent Labs  Lab 08/06/17 1206 08/06/17 1854 08/07/17 0431  TROPONINI 0.03* 0.05* 0.04*    Telemetry    RSR, freq PVC's, 7 beats NSVT this AM, 10 beat run of AIVR - Personally Reviewed  Cardiac Studies   2D Echocardiogram 5.10.2019  Echocardiogram - Left ventricle: The cavity size was mildly dilated. There was   mild concentric hypertrophy. Systolic function was severely   reduced. The estimated ejection fraction was in the range of 20%   to 25%. Diffuse hypokinesis. Regional wall motion abnormalities   cannot be excluded. The study is not technically sufficient to   allow evaluation of LV diastolic function. - Mitral valve: There was moderate  regurgitation. - Left atrium: The atrium was mildly dilated. - Right ventricle: The cavity size was mildly dilated. Wall   thickness was normal. Systolic function was mildly reduced. - Right atrium: The atrium was mildly dilated. - Tricuspid valve: There was moderate regurgitation. - Pulmonary arteries: Systolic pressure was severely elevated. PA   peak pressure: 68 mm Hg (S).   Patient Profile   Miguel Harvey is a 72 y.o. male with a hx of coronary artery disease, cardiomyopathy ejection fraction 25% on echocardiogram, smoker hyperlipidemia, diabetes, hypertension, presenting to the hospital with diabetic foot ulcer/infection, Has prior below-knee amputation right lower extremity one month ago in [place] Long-standing ulceration left foot with no healing. Does not ambulate much at baseline. Started on Zosyn and vancomycin, Was being prepped for debridement left foot with Dr. Ether Griffins PEA arrest  Assessment & Plan    1.  PEA arrest/elevated troponin:  In setting of anesthesia induction on 5/10.  Etiology unclear.  No recurrent symptomatic arrhythmias.  Tolerated I & D on 5/14. Known NICM.  No plan for ischemic eval @ this time.  2.  NICM/HFrEF:  Volume stable on exam.  Minus 530 overnight, minus 5.5L for admission.  Wts are likely inaccurate - 182  194  196 lbs recorded over past three days.  Cont  blocker, acei, spiro.  Supp K.  Will switch from IV to PO lasix - 40mg  daily (was on 20 daily @ home previously).  Renal fxn stable.  3.  Essential HTN:  Titrate acei.  4.  NSVT:  Asymptomatic.  Cont  blocker. Poor candidate for ICD.  5.  Osteomyelitis of left foot:  Abx per ID/IM.  S/p I&D.  Possible vasc procedure in near future.  6.  DMII:  A1c 9.5.  Per IM.  7.  Hypokalemia:  Supp.  Signed, Nicolasa Ducking, NP  08/12/2017, 8:38 AM    For questions or updates, please contact   Please consult [email or web address] for contact info under Cardiology/STEMI.

## 2017-08-12 NOTE — Progress Notes (Signed)
Daily Progress Note   Subjective  - 2 Days Post-Op  F/u left foot debridment.  No pain.    Objective Vitals:   08/11/17 1924 08/12/17 0346 08/12/17 0735 08/12/17 1632  BP: 128/89 (!) 148/91 (!) 144/97 (!) 144/88  Pulse: 79 76 73 73  Resp: 18 18 18 18   Temp: 98.4 F (36.9 C) 98.1 F (36.7 C) (!) 97.5 F (36.4 C) (!) 97.4 F (36.3 C)  TempSrc: Oral  Oral Oral  SpO2: 91% 95% 98% 100%  Weight:  88.9 kg (196 lb)    Height:        Physical Exam: Bloody drainage at this time.  Plantar wound is stable.   Laboratory CBC    Component Value Date/Time   WBC 8.8 08/12/2017 0427   HGB 9.2 (L) 08/12/2017 0427   HCT 28.2 (L) 08/12/2017 0427   PLT 249 08/12/2017 0427    BMET    Component Value Date/Time   NA 141 08/12/2017 0427   K 3.2 (L) 08/12/2017 0427   CL 106 08/12/2017 0427   CO2 25 08/12/2017 0427   GLUCOSE 157 (H) 08/12/2017 0427   BUN 14 08/12/2017 0427   CREATININE 1.02 08/12/2017 0427   CREATININE 1.43 (H) 09/21/2012 1150   CALCIUM 8.2 (L) 08/12/2017 0427   GFRNONAA >60 08/12/2017 0427   GFRAA >60 08/12/2017 0427    Assessment/Planning: Dressing changed   Going for angio tomorrow and PICC.  Can likely d/c this weekend once stable.  Will need daily dressings   Will f/u tomorrow for re-evaluation.  C/W NWB to left foot.   Gwyneth Revels A  08/12/2017, 5:46 PM

## 2017-08-13 ENCOUNTER — Encounter: Payer: Self-pay | Admitting: *Deleted

## 2017-08-13 ENCOUNTER — Inpatient Hospital Stay: Payer: Medicare Other

## 2017-08-13 ENCOUNTER — Encounter
Admission: AD | Disposition: A | Discharge status: Skilled Nursing Facility | Payer: Self-pay | Source: Ambulatory Visit | Attending: Internal Medicine

## 2017-08-13 DIAGNOSIS — I70248 Atherosclerosis of native arteries of left leg with ulceration of other part of lower left leg: Secondary | ICD-10-CM

## 2017-08-13 HISTORY — PX: LOWER EXTREMITY ANGIOGRAPHY: CATH118251

## 2017-08-13 LAB — GLUCOSE, CAPILLARY
GLUCOSE-CAPILLARY: 150 mg/dL — AB (ref 65–99)
GLUCOSE-CAPILLARY: 151 mg/dL — AB (ref 65–99)
GLUCOSE-CAPILLARY: 149 mg/dL — AB (ref 65–99)
Glucose-Capillary: 148 mg/dL — ABNORMAL HIGH (ref 65–99)
Glucose-Capillary: 207 mg/dL — ABNORMAL HIGH (ref 65–99)

## 2017-08-13 LAB — VANCOMYCIN, TROUGH: VANCOMYCIN TR: 20 ug/mL (ref 15–20)

## 2017-08-13 LAB — BASIC METABOLIC PANEL
Anion gap: 11 (ref 5–15)
BUN: 18 mg/dL (ref 6–20)
CHLORIDE: 106 mmol/L (ref 101–111)
CO2: 24 mmol/L (ref 22–32)
CREATININE: 1.09 mg/dL (ref 0.61–1.24)
Calcium: 8.4 mg/dL — ABNORMAL LOW (ref 8.9–10.3)
GFR calc non Af Amer: >60 mL/min (ref >60)
Glucose, Bld: 183 mg/dL — ABNORMAL HIGH (ref 65–99)
POTASSIUM: 3.8 mmol/L (ref 3.5–5.1)
SODIUM: 141 mmol/L (ref 135–145)

## 2017-08-13 SURGERY — LOWER EXTREMITY ANGIOGRAPHY
Anesthesia: Moderate Sedation | Laterality: Left

## 2017-08-13 MED ORDER — HEPARIN (PORCINE) IN NACL 1000-0.9 UT/500ML-% IV SOLN
INTRAVENOUS | Status: AC
  Filled 2017-08-13: qty 1000

## 2017-08-13 MED ORDER — ATORVASTATIN CALCIUM 20 MG PO TABS: 20.0000 mg | ORAL_TABLET | Freq: Every day | ORAL | Status: DC

## 2017-08-13 MED ORDER — CLOPIDOGREL BISULFATE 75 MG PO TABS
75.0000 mg | ORAL_TABLET | Freq: Every day | ORAL | Status: DC
  Administered 2017-08-13 – 2017-08-14 (×2): 75 mg via ORAL
  Filled 2017-08-13 (×2): qty 1

## 2017-08-13 MED ORDER — FUROSEMIDE 10 MG/ML IJ SOLN
40.0000 mg | Freq: Once | INTRAMUSCULAR | Status: AC
  Administered 2017-08-13: 40 mg via INTRAVENOUS

## 2017-08-13 MED ORDER — IOPAMIDOL (ISOVUE-300) INJECTION 61%
INTRAVENOUS | Status: DC | PRN
  Administered 2017-08-13: 65 mL via INTRAVENOUS

## 2017-08-13 MED ORDER — FENTANYL CITRATE (PF) 100 MCG/2ML IJ SOLN
INTRAMUSCULAR | Status: DC | PRN
  Administered 2017-08-13 (×2): 25 ug via INTRAVENOUS

## 2017-08-13 MED ORDER — IPRATROPIUM-ALBUTEROL 0.5-2.5 (3) MG/3ML IN SOLN
3.0000 mL | Freq: Once | RESPIRATORY_TRACT | Status: AC
  Administered 2017-08-13: 3 mL via RESPIRATORY_TRACT

## 2017-08-13 MED ORDER — SODIUM CHLORIDE 0.9 % IV SOLN
3.0000 g | Freq: Four times a day (QID) | INTRAVENOUS | Status: DC
  Administered 2017-08-13 – 2017-08-14 (×4): 3 g via INTRAVENOUS
  Filled 2017-08-13 (×6): qty 3

## 2017-08-13 MED ORDER — MIDAZOLAM HCL 5 MG/5ML IJ SOLN
INTRAMUSCULAR | Status: AC
  Filled 2017-08-13: qty 5

## 2017-08-13 MED ORDER — HEPARIN SODIUM (PORCINE) 1000 UNIT/ML IJ SOLN
INTRAMUSCULAR | Status: AC
  Filled 2017-08-13: qty 1

## 2017-08-13 MED ORDER — ASPIRIN EC 81 MG PO TBEC
81.0000 mg | DELAYED_RELEASE_TABLET | Freq: Every day | ORAL | Status: DC
  Administered 2017-08-13 – 2017-08-14 (×2): 81 mg via ORAL
  Filled 2017-08-13 (×2): qty 1

## 2017-08-13 MED ORDER — IPRATROPIUM-ALBUTEROL 0.5-2.5 (3) MG/3ML IN SOLN
RESPIRATORY_TRACT | Status: AC
  Administered 2017-08-13: 3 mL via RESPIRATORY_TRACT
  Filled 2017-08-13: qty 3

## 2017-08-13 MED ORDER — FENTANYL CITRATE (PF) 100 MCG/2ML IJ SOLN
INTRAMUSCULAR | Status: AC
  Filled 2017-08-13: qty 2

## 2017-08-13 MED ORDER — FUROSEMIDE 10 MG/ML IJ SOLN
INTRAMUSCULAR | Status: AC
  Administered 2017-08-13: 40 mg via INTRAVENOUS
  Filled 2017-08-13: qty 4

## 2017-08-13 MED ORDER — MIDAZOLAM HCL 2 MG/2ML IJ SOLN
INTRAMUSCULAR | Status: DC | PRN
  Administered 2017-08-13 (×2): 1 mg via INTRAVENOUS

## 2017-08-13 MED ORDER — SODIUM CHLORIDE 0.9 % IV SOLN: INTRAVENOUS | Status: DC

## 2017-08-13 MED ORDER — VANCOMYCIN HCL 10 G IV SOLR
1250.0000 mg | INTRAVENOUS | Status: DC
  Filled 2017-08-13: qty 1250

## 2017-08-13 MED ORDER — LISINOPRIL 20 MG PO TABS
20.0000 mg | ORAL_TABLET | Freq: Every day | ORAL | Status: DC
  Administered 2017-08-13 – 2017-08-14 (×2): 20 mg via ORAL
  Filled 2017-08-13 (×2): qty 1

## 2017-08-13 MED ORDER — LIDOCAINE-EPINEPHRINE (PF) 1 %-1:200000 IJ SOLN
INTRAMUSCULAR | Status: AC
  Filled 2017-08-13: qty 30

## 2017-08-13 SURGICAL SUPPLY — 24 items
BALLN LUTONIX 6X150X130 (BALLOONS) ×3
BALLN ULTRVRSE 3X150X150 (BALLOONS) ×3
BALLN ULTRVRSE 6X60X130C (BALLOONS) ×3
BALLOON LUTONIX 6X150X130 (BALLOONS) IMPLANT
BALLOON ULTRVRSE 3X150X150 (BALLOONS) IMPLANT
BALLOON ULTRVRSE 6X60X130C (BALLOONS) IMPLANT
CANNULA 5F STIFF (CANNULA) ×2 IMPLANT
CATH BEACON 5 .038 100 VERT TP (CATHETERS) ×2 IMPLANT
CATH GLIDE EXCHANG 5FR (CATHETERS) ×2 IMPLANT
CATH PIG 70CM (CATHETERS) ×2 IMPLANT
DEVICE PRESTO INFLATION (MISCELLANEOUS) ×2 IMPLANT
DEVICE STARCLOSE SE CLOSURE (Vascular Products) ×2 IMPLANT
GLIDEWIRE ADV .035X260CM (WIRE) ×2 IMPLANT
NDL ENTRY 21GA 7CM ECHOTIP (NEEDLE) IMPLANT
NEEDLE ENTRY 21GA 7CM ECHOTIP (NEEDLE) ×3 IMPLANT
PACK ANGIOGRAPHY (CUSTOM PROCEDURE TRAY) ×3 IMPLANT
SHEATH ANL2 6FRX45 HC (SHEATH) ×2 IMPLANT
SHEATH BRITE TIP 5FRX11 (SHEATH) ×2 IMPLANT
STENT LIFESTENT 5F 7X60X135 (Permanent Stent) ×2 IMPLANT
TRAY PICC SGL PWR INJ 4FR 55CM (TRAY / TRAY PROCEDURE) ×2 IMPLANT
TUBING CONTRAST HIGH PRESS 72 (TUBING) ×2 IMPLANT
WIRE G 018X200 V18 (WIRE) ×4 IMPLANT
WIRE G V18X300CM (WIRE) ×2 IMPLANT
WIRE J 3MM .035X145CM (WIRE) ×2 IMPLANT

## 2017-08-13 NOTE — Progress Notes (Signed)
Pt with progressive worsening breath sounds, audible rhonchi with and without stethoscope. Po2 sats 96 on Room air no respiratory distress, but 02 sats drop to 82-85% when asleep. Placed on 2 LNC. Pt complains of chest pain with short episode of PVCs. Chest pain resolved quickly. Dr. Wyn Quaker updated on Pt status , DuoNEB ordered and given, EKG completed with no acute changes from prior EKG, CXR and IV Lasix 40 mg ordered. PIV placed in left AC and lasix given. Dr. Wyn Quaker at bedside 1420 to assess Pt and review CXR. Pt resting in bed, will continue to monitor closely in Pre Procedure.

## 2017-08-13 NOTE — Progress Notes (Signed)
PHARMACY CONSULT NOTE FOR:  OUTPATIENT  PARENTERAL ANTIBIOTIC THERAPY (OPAT)  Indication: PAD and foot infection with OM Regimen: Unasyn 3 gm IV Q6H End date: 09/07/2017  Weekly labs to include CBC with dif, SCr, CRP - fax weekly labs to [phone]  IV antibiotic discharge orders are pended. To discharging provider:  please sign these orders via discharge navigator,  Select New Orders & click on the button choice - Manage This Unsigned Work.     Thank you for allowing pharmacy to be a part of this patient's care.  Carola Frost, Pharm.D., BCPS Clinical Pharmacist 08/13/2017, 3:30 PM

## 2017-08-13 NOTE — Consult Note (Signed)
Pharmacy Antibiotic Note  Miguel Harvey is a 72 y.o. male admitted on 08/05/2017 with Left foot infection. According to podiatry note, patient has PVD with necrotic tissue left forefoot with abscess. Patient is S/P BKA on right side and at high risk for BKA on left side . Pharmacy has been consulted for Zosyn and Vancomycin dosing.  Plan: 5/10 VT: 28- level is supratherapeutic. NEW Kinetics: Ke: 0.053   VD:63.9   T1/2: 13.08   Will adjust dose to Vancomycin 1500mg  IV Q18hr for goal trough of 15-20. Calculated trough at Css is 15. Trough level ordered prior to 4th dose.   Continue Zosyn 3.375 IV EI every 8 hours.   Height: 5\' 9"  (175.3 cm) Weight: 195 lb (88.5 kg) IBW/kg (Calculated) : 70.7  Temp (24hrs), Avg:97.5 F (36.4 C), Min:97.4 F (36.3 C), Max:97.6 F (36.4 C)  Recent Labs  Lab 08/06/17 1206  08/07/17 0431  08/08/17 0442 08/09/17 1050 08/10/17 0005 08/12/17 0427 08/13/17 0537  WBC 8.6  --  7.1  --  8.4  --   --  8.8  --   CREATININE 0.83   < > 0.75  --  0.94 0.95 1.10 1.02 1.09  VANCOTROUGH  --   --   --    < >  --   --  22*  --  20   < > = values in this interval not displayed.    Estimated Creatinine Clearance: 67.4 mL/min (by C-G formula based on SCr of 1.09 mg/dL).    No Known Allergies  Antimicrobials this admission: 5/9 Zosyn >>  5/9 Vancomycin >>   Dose adjustments this admission: 5/10 Vancomycin 1500 Q12H changed to Q18 hours  5/14 0000 vanc level 22. Changed to 1500 mg q 24 hours. Level before 4th new dose.  5/16 AM vanc given early. Trough rescheduled for 5/17 AM.  05/17 AM vanc level 20. ~25 hour level. Decreased dose to 1250 mg q 24 hours. Recheck level before 4th new dose.  Microbiology results: 5/9 MRSA PCR: negative   Thank you for allowing pharmacy to be a part of this patient's care.  Erich Montane, PharmD, BCPS Clinical Pharmacist 08/13/2017 6:36 AM

## 2017-08-13 NOTE — Care Management Important Message (Signed)
Important Message  Patient Details  Name: ROLLAN ELSENPETER MRN: [id number] Date of Birth: [birth year]   Medicare Important Message Given:  Yes    Olegario Messier A [place] 08/13/2017, 11:16 AM

## 2017-08-13 NOTE — H&P (Signed)
[facility] History & Physical Update  The patient was interviewed and re-examined.  The patient's previous History and Physical has been reviewed and is unchanged.  There is no change in the plan of care. We plan to proceed with the scheduled procedure.  Festus Barren, MD  08/13/2017, 12:59 PM

## 2017-08-13 NOTE — Progress Notes (Signed)
PT Cancellation Note  Patient Details Name: TOKUO ABAJIAN MRN: [id number] DOB: 07/05/1945   Cancelled Treatment:    Reason Eval/Treat Not Completed: Patient at procedure or test/unavailable Pt out of room having PICC line placed, PT eval as time allows.  Will try to see tomorrow if today is not feasible.  Malachi Pro, DPT 08/13/2017, 2:35 PM

## 2017-08-13 NOTE — Progress Notes (Signed)
[facility] CLINIC INFECTIOUS DISEASE PROGRESS NOTE Date of Admission:  08/05/2017     ID: Miguel Harvey is a 72 y.o. male with DM foot infection  Active Problems:   Left foot infection   Subjective: S/p partial ray amputation of 4th and 5th MT and I and D of plantar abscess  ROS  Eleven systems are reviewed and negative except per hpi  Medications:  Antibiotics Given (last 72 hours)    Date/Time Action Medication Dose Rate   08/10/17 2114 New Bag/Given   [MAR Hold] piperacillin-tazobactam (ZOSYN) IVPB 3.375 g (MAR Hold since Fri 08/13/2017 at 1245. Reason: Transfer to a Procedural area.) 3.375 g 12.5 mL/hr   08/11/17 0612 New Bag/Given   [MAR Hold] piperacillin-tazobactam (ZOSYN) IVPB 3.375 g (MAR Hold since Fri 08/13/2017 at 1245. Reason: Transfer to a Procedural area.) 3.375 g 12.5 mL/hr   08/11/17 0613 New Bag/Given   vancomycin (VANCOCIN) 1,500 mg in sodium chloride 0.9 % 500 mL IVPB 1,500 mg 250 mL/hr   08/11/17 1404 New Bag/Given   [MAR Hold] piperacillin-tazobactam (ZOSYN) IVPB 3.375 g (MAR Hold since Fri 08/13/2017 at 1245. Reason: Transfer to a Procedural area.) 3.375 g 12.5 mL/hr   08/11/17 2127 New Bag/Given   [MAR Hold] piperacillin-tazobactam (ZOSYN) IVPB 3.375 g (MAR Hold since Fri 08/13/2017 at 1245. Reason: Transfer to a Procedural area.) 3.375 g 12.5 mL/hr   08/12/17 0430 New Bag/Given   vancomycin (VANCOCIN) 1,500 mg in sodium chloride 0.9 % 500 mL IVPB 1,500 mg 250 mL/hr   08/12/17 0455 New Bag/Given   [MAR Hold] piperacillin-tazobactam (ZOSYN) IVPB 3.375 g (MAR Hold since Fri 08/13/2017 at 1245. Reason: Transfer to a Procedural area.) 3.375 g 12.5 mL/hr   08/12/17 1405 New Bag/Given   [MAR Hold] piperacillin-tazobactam (ZOSYN) IVPB 3.375 g (MAR Hold since Fri 08/13/2017 at 1245. Reason: Transfer to a Procedural area.) 3.375 g 12.5 mL/hr   08/12/17 2340 New Bag/Given   [MAR Hold] piperacillin-tazobactam (ZOSYN) IVPB 3.375 g (MAR Hold since Fri 08/13/2017 at 1245. Reason:  Transfer to a Procedural area.) 3.375 g 12.5 mL/hr   08/13/17 0653 New Bag/Given   [MAR Hold] piperacillin-tazobactam (ZOSYN) IVPB 3.375 g (MAR Hold since Fri 08/13/2017 at 1245. Reason: Transfer to a Procedural area.) 3.375 g 12.5 mL/hr   08/13/17 0653 New Bag/Given   vancomycin (VANCOCIN) 1,500 mg in sodium chloride 0.9 % 500 mL IVPB 1,500 mg 250 mL/hr     . [MAR Hold] atorvastatin  40 mg Oral QPM  . [MAR Hold] carvedilol  25 mg Oral BID WC  . [MAR Hold] enoxaparin (LOVENOX) injection  40 mg Subcutaneous Q24H  . [MAR Hold] furosemide  40 mg Oral Daily  . [MAR Hold] insulin aspart  0-5 Units Subcutaneous QHS  . [MAR Hold] insulin aspart  0-9 Units Subcutaneous TID WC  . [MAR Hold] lisinopril  20 mg Oral Daily  . [MAR Hold] multivitamin-lutein  1 capsule Oral Daily  . [MAR Hold] protein supplement shake  11 oz Oral BID BM  . [MAR Hold] sodium chloride flush  10-40 mL Intracatheter Q12H  . [MAR Hold] spironolactone  25 mg Oral Daily  . [MAR Hold] tamsulosin  0.4 mg Oral Daily    Objective: Vital signs in last 24 hours: Temp:  [97.4 F (36.3 C)-98.5 F (36.9 C)] 98.5 F (36.9 C) (05/17 1247) Pulse Rate:  [68-79] 77 (05/17 1247) Resp:  [15-18] 15 (05/17 1247) BP: (136-158)/(88-120) 136/120 (05/17 1247) SpO2:  [96 %-100 %] 97 % (05/17 1247) Weight:  [  88.5 kg (195 lb)] 88.5 kg (195 lb) (05/17 1247) Constitutional: chronically ill and disheveled, unable to provide any history  HENT: anicteric  Mouth/Throat: Oropharynx is clear and moist.poor dentition Cardiovascular: Normal rate, regular rhythm and normal heart sounds.  Pulmonary/Chest: Effort normal and breath sounds normal. No respiratory distress. He has no wheezes.  Abdominal: Soft. Bowel sounds are normal. He exhibits no distension. There is no tenderness.  Lymphadenopathy:  He has no cervical adenopathy.  Neurological: He is alert and oriented to person, place, and time.  Ext - R BKA site well coapted.  Skin L foot wrapped  post op Psychiatric: He has a normal mood and affect. His behavior is normal.   Lab Results Recent Labs    08/12/17 0427 08/13/17 0537  WBC 8.8  --   HGB 9.2*  --   HCT 28.2*  --   NA 141 141  K 3.2* 3.8  CL 106 106  CO2 25 24  BUN 14 18  CREATININE 1.02 1.09    Microbiology: Results for orders placed or performed during the hospital encounter of 08/05/17  MRSA PCR Screening     Status: None   Collection Time: 08/05/17  5:17 PM  Result Value Ref Range Status   MRSA by PCR NEGATIVE NEGATIVE Final    Comment:        The GeneXpert MRSA Assay (FDA approved for NASAL specimens only), is one component of a comprehensive MRSA colonization surveillance program. It is not intended to diagnose MRSA infection nor to guide or monitor treatment for MRSA infections. Performed at [facility], [address] [place], [place] [address]   Aerobic/Anaerobic Culture (surgical/deep wound)     Status: Abnormal   Collection Time: 08/06/17  2:42 PM  Result Value Ref Range Status   Specimen Description   Final    FOOT LEFT Performed at [facility], [address] [place], [place] [address]    Special Requests   Final    Immunocompromised Performed at [facility], [address] [place], [place] [address]    Gram Stain   Final    FEW WBC PRESENT,BOTH PMN AND MONONUCLEAR FEW GRAM POSITIVE COCCI RARE GRAM POSITIVE RODS    Culture (A)  Final    MULTIPLE ORGANISMS PRESENT, NONE PREDOMINANT NO ANAEROBES ISOLATED Performed at [facility], [address], [place], [place] [address]    Report Status 08/11/2017 FINAL  Final  Aerobic/Anaerobic Culture (surgical/deep wound)     Status: None (Preliminary result)   Collection Time: 08/10/17 12:52 PM  Result Value Ref Range Status   Specimen Description   Final    WOUND Performed at [facility], [address] [place], [place] [address]    Special Requests   Final    LEFT FOOT  WOUND Performed at [facility], [address] [place], [place] [address]    Gram Stain   Final    ABUNDANT WBC PRESENT, PREDOMINANTLY PMN RARE GRAM POSITIVE COCCI Performed at [facility], [address] [place], [place] [address]    Culture   Final    NORMAL SKIN FLORA NO ANAEROBES ISOLATED; CULTURE IN PROGRESS FOR 5 DAYS    Report Status PENDING  Incomplete    Studies/Results: Dg Chest Port 1 View  Result Date: 08/13/2017 CLINICAL DATA:  Shortness of breath. EXAM: PORTABLE CHEST 1 VIEW COMPARISON:  Radiograph of Aug 06, 2017. FINDINGS: Stable cardiomediastinal silhouette. Mild central pulmonary vascular congestion is  noted with probable bilateral perihilar edema. No pneumothorax or significant pleural effusion is noted. Bony thorax is unremarkable. IMPRESSION: Mild central pulmonary vascular congestion is noted with probable bilateral perihilar edema. Electronically Signed   By: Lupita Raider, M.D.   On: 08/13/2017 14:33    Assessment/Plan: Miguel Harvey is a 72 y.o. male with poorly controlled DM with PN and PAD s/p recent R BKA now with L foot infection with osteo noted on MRI as well as soft tissue abscesses. He has had a complicated hospitalization with PEA arrest bit has seen podiatry and vascular. S/p ray amputation and I and D 5/14.   5/17 - getting angio today. Had diff placing PICC but to get that placed today as well. I think high risk he will pull out picc but can try IV treatment  Cultures with just skin flora.   Recommendations Can dc vanco Rec unasyn 3 gm q 6 hours for 28 days total - see abx order sheet. May need longer oral course depending on clinical response.  I can see in clinic in 3 weeks Thank you very much for the consult. Will follow with you.  Miguel Harvey   08/13/2017, 3:22 PM

## 2017-08-13 NOTE — Progress Notes (Signed)
Clinical Child psychotherapist (CSW) made [employer] [facility] aware that per Dr. Jarrett Ables IV ABX orders patient will need unasyn 3 gm IV q 6 hours. CSW also faxed IV ABX to [facility]. Per Tresa Endo patient can come to [facility] when medically stable.   [employer], LCSW [phone]

## 2017-08-13 NOTE — Plan of Care (Signed)
  Problem: Health Behavior/Discharge Planning: Goal: Ability to manage health-related needs will improve Outcome: Not Progressing Note:  Patient has torn out 2 PIV's within the last 24 hours, one overnight and one prior to 8A this morning. IV team consult was pending, but patient scheduled to have PICC placed when sedated for other procedure today. Patient currently in said procedure(s). Will continue to monitor. Jari Favre [facility]

## 2017-08-13 NOTE — Progress Notes (Signed)
[employer] - [place] at [facility]   PATIENT NAME: Miguel Harvey    MR#:  [id number]  DATE OF BIRTH:  03/20/1946  SUBJECTIVE:  CHIEF COMPLAINT:  No chief complaint on file.  patient sitting up in bed eating lunch. No new complaints per patient  REVIEW OF SYSTEMS:  Review of Systems  Constitutional: Negative for chills and fever.  HENT: Negative for congestion, ear discharge, hearing loss and tinnitus.   Eyes: Negative for blurred vision, double vision and discharge.  Respiratory: Negative for cough, shortness of breath and wheezing.   Cardiovascular: Negative for chest pain, palpitations and leg swelling.  Gastrointestinal: Negative for abdominal pain, blood in stool, nausea and vomiting.  Genitourinary: Negative for dysuria and urgency.  Musculoskeletal: Negative for back pain.  Skin: Negative for rash.  Neurological: Negative for dizziness, tremors and focal weakness.  Endo/Heme/Allergies: Does not bruise/bleed easily.  Psychiatric/Behavioral: Negative for depression.   DRUG ALLERGIES:  No Known Allergies VITALS:  Blood pressure (!) 136/120, pulse 77, temperature 98.5 F (36.9 C), temperature source Oral, resp. rate 15, height 5\' 9"  (1.753 m), weight 88.5 kg (195 lb), SpO2 97 %. PHYSICAL EXAMINATION:  Physical Exam  Constitutional: He is oriented to person, place, and time.  HENT:  Head: Normocephalic.  Eyes: Pupils are equal, round, and reactive to light. No scleral icterus.  Neck: Neck supple. No JVD present. No tracheal deviation present.  Cardiovascular: Normal rate, regular rhythm and normal heart sounds. Exam reveals no gallop.  No murmur heard. Pulmonary/Chest: He has no wheezes. He has no rales.  Crackles  Abdominal: Soft. He exhibits no distension. There is no tenderness. There is no rebound.  Musculoskeletal: He exhibits no edema or tenderness.  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  Neurological: He is alert and oriented to person, place, and  time. No cranial nerve deficit or sensory deficit. He exhibits normal muscle tone.  Skin: No rash noted. No erythema.  Psychiatric: He has a normal mood and affect.   LABORATORY PANEL:  Male CBC Recent Labs  Lab 08/12/17 0427  WBC 8.8  HGB 9.2*  HCT 28.2*  PLT 249   ------------------------------------------------------------------------------------------------------------------ Chemistries  Recent Labs  Lab 08/08/17 0442  08/13/17 0537  NA 140   < > 141  K 3.3*   < > 3.8  CL 106   < > 106  CO2 25   < > 24  GLUCOSE 102*   < > 183*  BUN 12   < > 18  CREATININE 0.94   < > 1.09  CALCIUM 7.7*   < > 8.4*  MG 1.7  --   --    < > = values in this interval not displayed.   RADIOLOGY:  Dg Chest Port 1 View  Result Date: 08/13/2017 CLINICAL DATA:  Shortness of breath. EXAM: PORTABLE CHEST 1 VIEW COMPARISON:  Radiograph of Aug 06, 2017. FINDINGS: Stable cardiomediastinal silhouette. Mild central pulmonary vascular congestion is noted with probable bilateral perihilar edema. No pneumothorax or significant pleural effusion is noted. Bony thorax is unremarkable. IMPRESSION: Mild central pulmonary vascular congestion is noted with probable bilateral perihilar edema. Electronically Signed   By: Lupita Raider, M.D.   On: 08/13/2017 14:33   ASSESSMENT AND PLAN:  31 y m with for left foot infection.  * Cardiac arrest and respiratory failure. S/p CPR and intubation-extubation - on room air now -patient in sinus rhythm  * Hypotension: resolved  * Left foot infection with osteomyelitis.  Zosyn  and vancomycin pharmacy to dose. 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  - pending culture results -discussed with Dr. Wyn Quaker vascular and podiatry Dr. Ether Griffins. Patient will get left lower extremity angiogram today along with PICC line placement for IV antibiotics. -patient is best at baseline regarding his cardiac condition-- discussed with cardiology -   removed TLC in groin per ID  * Ac on ch systolic CHF- EF 63%   Po  lasix 40 mg daily, monitor I/o   Also have severe high Right side pressure, manage per cardio.  * PAD. Plan for vascular angiogram- today  * Hypertension: continue coreg and lisinopril.   * Diabetes.  Continue sliding scale.  Hold metformin.  * Anemia of chronic disease. Stable Hb.  Patient is calm and pleasant today. Will try to DC sitter today.  Patient comes from [facility] - after long discussion with CSW - this will be a difficult transition at D/C but going back to [facility] - hopefully they can transition him soon to Appropriate NSOC.   All the records are reviewed and case discussed with Care Management/Social Worker. Management plans discussed with the patient, Dr Lewie Loron and they are in agreement.  CODE STATUS: Full Code  TOTAL TIME TAKING CARE OF THIS PATIENT: 35 minutes.  More than 50% of the time was spent in counseling/coordination of care: YES  POSSIBLE D/C IN 1-2 DAYS, DEPENDING ON CLINICAL CONDITION.   Enedina Finner M.D on 08/13/2017 at 3:16 PM  Between 7am to 6pm - Pager - [phone]  After 6pm go to [email or web address] - Therapist, nutritional Hospitalists

## 2017-08-13 NOTE — Op Note (Signed)
[facility]   OPERATIVE NOTE    PRE-OPERATIVE DIAGNOSIS: Left foot infection with osteomyelitis, PAD  POST-OPERATIVE DIAGNOSIS: Same as above  PROCEDURE: 1.  1. Ultrasound guidance for vascular access to right basilic vein 2. Fluoroscopic guidance for placement of catheter 3. Insertion of peripherally inserted central venous catheter to the right basilic vein  SURGEON: Festus Barren, MD  ANESTHESIA: local  ESTIMATED BLOOD LOSS: 2 cc  FINDING(S): 1.  none  SPECIMEN(S):  none  INDICATIONS:   Miguel Harvey is a 72 y.o. male who presents with left foot infection and osteomyelitis.  He is undergoing revascularization as well and that was dictated separately. We are asked to place a PICC line for long term IV access.  Risks and benefits are discussed, and informed consent was obtained.  DESCRIPTION: After obtaining full informed written consent, the patient was brought back to the vascular suite.  The patient's right arm was sterilely prepped and draped, and a sterile surgical field was created. The right basilic vein was accessed under direct ultrasound guidance without difficulty with a micropuncture needle and a permanent image was recorded. A 0.018 wire was then placed into the superior vena cava. Peel-away sheath was placed over the wire. A peripherally inserted central venous catheter was then placed over the wire and the wire and peel-away sheath were removed. The catheter tip was placed into the superior vena cava at the cavoatrial junction and was secured at the skin at 36 cm with a sterile dressing. The catheter withdrew blood well and flushed easily with heparinized saline. The patient tolerated the procedure well without complications.  COMPLICATIONS: none  CONDITION: stable  Festus Barren  08/13/2017, 3:44 PM  This note was created with Dragon Medical transcription system. Any errors in dictation are purely unintentional.

## 2017-08-13 NOTE — Op Note (Signed)
[facility]  Percutaneous Study/Intervention Procedural Note   Date of Surgery: 08/05/2017 - 08/13/2017  Surgeon(s):DEW,JASON    Assistants:none  Pre-operative Diagnosis: PAD with ulceration and infection left lower extremity  Post-operative diagnosis:  Same  Procedure(s) Performed:             1.  Ultrasound guidance for vascular access right femoral artery             2.  Catheter placement into left peroneal artery and left posterior tibial artery from right femoral approach             3.  Aortogram and selective left lower extremity angiogram             4.  Percutaneous transluminal angioplasty of left peroneal artery with 3 mm diameter by 15 cm length angioplasty balloon             5.   Percutaneous transluminal angioplasty of the left posterior tibial artery and tibioperoneal trunk with 3 mm diameter by 15 cm length angioplasty balloon  6.  Percutaneous transluminal angioplasty of the distal left SFA with 6 mm diameter by 15 cm length Lutonix drug-coated angioplasty balloon  7.  Self-expanding stent placement to the left distal SFA for greater than 50% residual stenosis and dissection after angioplasty with 7 mm diameter by 6 cm length life stent             8.  StarClose closure device right femoral artery  EBL: 10 cc  Contrast: 65 cc  Fluoro Time: 4 minutes  Moderate Conscious Sedation Time: approximately 45 minutes using 2 mg of Versed and 50 mcg of Fentanyl              Indications:  Patient is a 72 y.o.male with PAD and a nonhealing ulceration of the left foot with infection. The patient is brought in for angiography for further evaluation and potential treatment.  Due to the limb threatening nature of the situation, angiogram was performed for attempted limb salvage. The patient is aware that if the procedure fails, amputation would be expected.  The patient also understands that even with successful revascularization, amputation may still be  required due to the severity of the situation. Risks and benefits are discussed and informed consent is obtained.   Procedure:  The patient was identified and appropriate procedural time out was performed.  The patient was then placed supine on the table and prepped and draped in the usual sterile fashion. Moderate conscious sedation was administered during a face to face encounter with the patient throughout the procedure with my supervision of the RN administering medicines and monitoring the patient's vital signs, pulse oximetry, telemetry and mental status throughout from the start of the procedure until the patient was taken to the recovery room. Ultrasound was used to evaluate the right common femoral artery.  It was patent .  A digital ultrasound image was acquired.  A Seldinger needle was used to access the right common femoral artery under direct ultrasound guidance and a permanent image was performed.  A 0.035 J wire was advanced without resistance and a 5Fr sheath was placed.  Pigtail catheter was placed into the aorta and an AP aortogram was performed. This demonstrated normal renal arteries and normal aorta and iliac segments without significant stenosis although imaging was very poor given the patient's continuous motion. I then crossed the aortic bifurcation and advanced to the left femoral head. Selective left lower extremity angiogram was  then performed. This demonstrated normal common femoral artery and profunda femoris artery.  There is about a 30% stenosis in the proximal superficial femoral artery and then the vessel normalized until the distal superficial femoral artery where there was a high-grade, near occlusive stenosis of greater than 90%.  There was some mild to moderate disease above and below this area for several centimeters.  The vessel then normalized in the popliteal artery.  The posterior tibial artery was the best runoff to the foot but had about a 70 to 80% stenosis in its  proximal segment.  The tibioperoneal trunk also had some moderate disease.  The peroneal artery was smaller and had diffuse disease proximally of greater than 80%.  The anterior tibial artery was chronically occluded. The patient was systemically heparinized and a 6 [place] Ansell sheath was then placed over the [employer] wire. I then used a Kumpe catheter and the advantage wire to navigate through the disease in the superficial femoral artery and confirm intraluminal flow in the popliteal artery below the lesion.  I then exchanged for a 0.018 wire and navigated first down the peroneal artery.  The wire was removed and intraluminal flow was confirmed with a Kumpe catheter.  I then replaced the wire to proceed with treatment and remove the catheter.  A 3 mm diameter by 15 cm length angioplasty balloon was then used to treat the proximal and mid peroneal arteries.  This was inflated to 10 atm for 1 minute.  Completion angiogram showed less than 10% residual stenosis in the areas treated, but the vessel was still small distally and did not really contribute much flow to the foot.  I then turned my attention to the posterior tibial artery.  Using the Kumpe catheter in the 0.018 wire was able to navigate through the stenosis in the posterior tibial artery.  The 3 mm diameter by 15 cm length angioplasty balloon was used to treat the proximal posterior tibial artery and tibioperoneal trunk.  It was inflated to 12 atm for 1 minute.  Completion angiogram showed about a 15% residual stenosis in the posterior tibial artery and this being the dominant runoff to the foot.  I then turned my attention to the SFA lesion.  A 6 mm diameter by 15 cm length Lutonix drug-coated angioplasty balloon was inflated to 12 atm for 1 minute and the distal superficial femoral artery.  Completion angiogram showed a focal dissection with a roughly 50% residual stenosis associated with the dissection.  I elected to place a stent.  A 7 mm  diameter by 6 cm length stent was used to treat the focal residual lesion.  This was postdilated with a 6 mm balloon with excellent angiographic completion result and no residual stenosis.  I elected to terminate the procedure. The sheath was removed and StarClose closure device was deployed in the right femoral artery with excellent hemostatic result.  A PICC line was also placed which will be dictated separately.  The patient was taken to the recovery room in stable condition having tolerated the procedure well.  Findings:               Aortogram:  Normal renal arteries, aorta, and iliac arteries without significant stenosis             Left lower Extremity:  Normal common femoral artery and profunda femoris artery.  There is about a 30% stenosis in the proximal superficial femoral artery and then the vessel normalized until the distal superficial  femoral artery where there was a high-grade, near occlusive stenosis of greater than 90%.  There was some mild to moderate disease above and below this area for several centimeters.  The vessel then normalized in the popliteal artery.  The posterior tibial artery was the best runoff to the foot but had about a 70 to 80% stenosis in its proximal segment.  The tibioperoneal trunk also had some moderate disease.  The peroneal artery was smaller and had diffuse disease proximally of greater than 80%.  The anterior tibial artery was chronically occluded   Disposition: Patient was taken to the recovery room in stable condition having tolerated the procedure well.  Complications: None  Leotis Pain 08/13/2017 3:30 PM   This note was created with Dragon Medical transcription system. Any errors in dictation are purely unintentional.

## 2017-08-13 NOTE — Progress Notes (Signed)
Daily Progress Note   Subjective  - 3 Days Post-Op  F/u left foot. For angio today  Objective Vitals:   08/12/17 1632 08/12/17 2021 08/13/17 0406 08/13/17 0827  BP: (!) 144/88 (!) 143/89 (!) 142/92 (!) 158/106  Pulse: 73 79 68 78  Resp: 18 18 17 18   Temp: (!) 97.4 F (36.3 C) 97.6 F (36.4 C) (!) 97.5 F (36.4 C)   TempSrc: Oral Oral Oral   SpO2: 100% 97% 97% 96%  Weight:   88.5 kg (195 lb)   Height:        Physical Exam: Wound is stable.  Mostly clear and bloody drainage.  No purulence.  Laboratory CBC    Component Value Date/Time   WBC 8.8 08/12/2017 0427   HGB 9.2 (L) 08/12/2017 0427   HCT 28.2 (L) 08/12/2017 0427   PLT 249 08/12/2017 0427    BMET    Component Value Date/Time   NA 141 08/13/2017 0537   K 3.8 08/13/2017 0537   CL 106 08/13/2017 0537   CO2 24 08/13/2017 0537   GLUCOSE 183 (H) 08/13/2017 0537   BUN 18 08/13/2017 0537   CREATININE 1.09 08/13/2017 0537   CREATININE 1.43 (H) 09/21/2012 1150   CALCIUM 8.4 (L) 08/13/2017 0537   GFRNONAA >60 08/13/2017 0537   GFRAA >60 08/13/2017 0537    Assessment/Planning: OK from podiatry to d/c   NWB left foot  Dressing changes:  Flush wound with saline.  Apply bulky sterile dressing daily.  F/u with me in 2 weeks.   Miguel Harvey A  08/13/2017, 12:19 PM

## 2017-08-13 NOTE — Progress Notes (Signed)
Infectious Disease Long Term IV Antibiotic Orders Miguel Harvey May 04, 1945  Diagnosis: PAD and foot infeciton with osteomyelitis  Culture results Mixed   LABS Lab Results  Component Value Date   CREATININE 1.09 08/13/2017   Lab Results  Component Value Date   WBC 8.8 08/12/2017   HGB 9.2 (L) 08/12/2017   HCT 28.2 (L) 08/12/2017   MCV 83.8 08/12/2017   PLT 249 08/12/2017   Lab Results  Component Value Date   ESRSEDRATE 65 (H) 08/12/2017   Lab Results  Component Value Date   CRP 7.9 (H) 08/12/2017    Allergies: No Known Allergies  Discharge antibiotics unasyn 3 gm IV q 6 hours  PICC Care per protocol Labs weekly while on IV antibiotics -FAX weekly labs to [phone] CBC w diff   Cr  CRP   Planned duration of antibiotics 4 weeks from 4/14  Stop date 09/07/17  Follow up clinic date 2-3 weeks   Mick Sell, MD

## 2017-08-13 NOTE — Progress Notes (Signed)
Progress Note  Patient Name: Miguel Harvey Date of Encounter: 08/13/2017  Primary Cardiologist: Julien Nordmann, MD  Subjective   Breathing stable.  No chest pain or shortness of breath.  Inpatient Medications    Scheduled Meds: . atorvastatin  40 mg Oral QPM  . carvedilol  25 mg Oral BID WC  . enoxaparin (LOVENOX) injection  40 mg Subcutaneous Q24H  . furosemide  40 mg Oral Daily  . insulin aspart  0-5 Units Subcutaneous QHS  . insulin aspart  0-9 Units Subcutaneous TID WC  . lisinopril  20 mg Oral Daily  . multivitamin-lutein  1 capsule Oral Daily  . protein supplement shake  11 oz Oral BID BM  . sodium chloride flush  10-40 mL Intracatheter Q12H  . spironolactone  25 mg Oral Daily  . tamsulosin  0.4 mg Oral Daily   Continuous Infusions: .  ceFAZolin (ANCEF) IV    . piperacillin-tazobactam (ZOSYN)  IV 3.375 g (08/13/17 0653)  . vancomycin Stopped (08/13/17 0900)   PRN Meds: acetaminophen **OR** acetaminophen, albuterol, bisacodyl, gadobenate dimeglumine, LORazepam, oxyCODONE-acetaminophen, senna-docusate, sodium chloride flush   Vital Signs    Vitals:   08/12/17 1632 08/12/17 2021 08/13/17 0406 08/13/17 0827  BP: (!) 144/88 (!) 143/89 (!) 142/92 (!) 158/106  Pulse: 73 79 68 78  Resp: 18 18 17 18   Temp: (!) 97.4 F (36.3 C) 97.6 F (36.4 C) (!) 97.5 F (36.4 C)   TempSrc: Oral Oral Oral   SpO2: 100% 97% 97% 96%  Weight:   195 lb (88.5 kg)   Height:        Intake/Output Summary (Last 24 hours) at 08/13/2017 0945 Last data filed at 08/13/2017 0000 Gross per 24 hour  Intake 960 ml  Output 600 ml  Net 360 ml   Filed Weights   08/11/17 0500 08/12/17 0346 08/13/17 0406  Weight: 194 lb 4.8 oz (88.1 kg) 196 lb (88.9 kg) 195 lb (88.5 kg)    Physical Exam   GEN: Well nourished, well developed, in no acute distress.  HEENT: Grossly normal.  Neck: Supple, JVP 10-12 cm, no carotid bruits, or masses. Cardiac: RRR, distant, no murmurs, rubs, or gallops. No  clubbing, cyanosis, edema.  R BKA. Left foot wrapped. Respiratory:  Respirations regular and unlabored, clear to auscultation bilaterally. GI: Soft, nontender, nondistended, BS + x 4. MS: no deformity or atrophy. Skin: warm and dry, no rash. Neuro:  Strength and sensation are intact. Psych: AAOx3.  Normal affect.  Labs    Chemistry Recent Labs  Lab 08/10/17 0005 08/12/17 0427 08/13/17 0537  NA 141 141 141  K 3.6 3.2* 3.8  CL 105 106 106  CO2 29 25 24   GLUCOSE 204* 157* 183*  BUN 12 14 18   CREATININE 1.10 1.02 1.09  CALCIUM 7.9* 8.2* 8.4*  GFRNONAA >60 >60 >60  GFRAA >60 >60 >60  ANIONGAP 7 10 11      Hematology Recent Labs  Lab 08/07/17 0431 08/08/17 0442 08/12/17 0427  WBC 7.1 8.4 8.8  RBC 3.46* 3.52* 3.37*  HGB 9.6* 9.9* 9.2*  HCT 29.1* 29.6* 28.2*  MCV 84.2 83.9 83.8  MCH 27.8 28.1 27.4  MCHC 33.0 33.5 32.7  RDW 15.8* 16.0* 15.7*  PLT 229 236 249    Cardiac Enzymes Recent Labs  Lab 08/06/17 1206 08/06/17 1854 08/07/17 0431  TROPONINI 0.03* 0.05* 0.04*    Telemetry    Normal sinus rhythm with PVCs.- Personally Reviewed  Cardiac Studies   2D Echocardiogram 5.10.2019  Echocardiogram - Left ventricle: The cavity size was mildly dilated. There was   mild concentric hypertrophy. Systolic function was severely   reduced. The estimated ejection fraction was in the range of 20%   to 25%. Diffuse hypokinesis. Regional wall motion abnormalities   cannot be excluded. The study is not technically sufficient to   allow evaluation of LV diastolic function. - Mitral valve: There was moderate regurgitation. - Left atrium: The atrium was mildly dilated. - Right ventricle: The cavity size was mildly dilated. Wall   thickness was normal. Systolic function was mildly reduced. - Right atrium: The atrium was mildly dilated. - Tricuspid valve: There was moderate regurgitation. - Pulmonary arteries: Systolic pressure was severely elevated. PA   peak pressure: 68  mm Hg (S).   Patient Profile   Miguel Harvey is a 72 y.o. male with a hx of coronary artery disease, cardiomyopathy ejection fraction 25% on echocardiogram, smoker hyperlipidemia, diabetes, hypertension, presenting to the hospital with diabetic foot ulcer/infection, Has prior below-knee amputation right lower extremity one month ago in [place] Long-standing ulceration left foot with no healing. Does not ambulate much at baseline. Started on Zosyn and vancomycin, Was being prepped for debridement left foot with Dr. Ether Griffins PEA arrest  Assessment & Plan    1.  PEA arrest/elevated troponin:  In setting of anesthesia induction on 5/10.  Etiology unclear but was likely due to decompensated heart failure.  No recurrent symptomatic arrhythmias.  Tolerated I & D on 5/14. Known NICM.  No plan for ischemic eval @ this time.  2.  NICM/HFrEF:  Volume stable on exam.  He appears to be euvolemic on current dose of furosemide. Continue treatment with carvedilol, spironolactone and lisinopril.  I increase lisinopril to 20 mg daily with given that his blood pressure is trending up. We can consider switching him to [facility] as an outpatient.  3.  Essential HTN:  Titrate acei as outlined above  4.  NSVT:  Asymptomatic.  Cont  blocker. Poor candidate for ICD.  5.  Osteomyelitis of left foot:  Abx per ID/IM.  S/p I&D.   The patient is supposed to get an angiogram done today.  6.  DMII:  A1c 9.5.  Per IM.  Signed, Lorine Bears, MD  08/13/2017, 9:45 AM    For questions or updates, please contact   Please consult [email or web address] for contact info under Cardiology/STEMI.

## 2017-08-13 NOTE — Progress Notes (Signed)
Lost IV access on patient.  Catheter kinked.  Could not straighten.  IV team consult will be put in.

## 2017-08-13 NOTE — Progress Notes (Signed)
Patient returned from [facility] recovery with a brand new P.I.C.C. line that hasn't been charted, at all. Unsure even how many lumens it has because it's Kerlix wrapped due to patient being known to pull at IV lines / dressings, condom catheters, etc. I will attempt to chart with what little info I know about the procedure on the patient's IV flowsheet now. Jari Favre [facility]

## 2017-08-13 NOTE — Progress Notes (Signed)
Per MD patient may D/C tomorrow to [facility]. Per [facility] admissions coordinator at [facility] they have received [facility] SN[facility] authorization. Clinical Child psychotherapist (CSW) made [facility] aware that patient will need a PICC line for IV ABX. Per Tresa Endo patient can come to [facility] over the weekend if stable. Tresa Endo is aware that plan is for patient to transition to long term care and has a pending medicaid application. CSW contacted patient's daughter Duwayne Heck and made her aware of above. Daughter is in agreement with D/C plan.   [employer], LCSW [phone]

## 2017-08-14 LAB — GLUCOSE, CAPILLARY
GLUCOSE-CAPILLARY: 123 mg/dL — AB (ref 65–99)
GLUCOSE-CAPILLARY: 159 mg/dL — AB (ref 65–99)

## 2017-08-14 MED ORDER — OXYCODONE-ACETAMINOPHEN 5-325 MG PO TABS: 1.0000 | ORAL_TABLET | Freq: Three times a day (TID) | ORAL | 0 refills | Status: DC | PRN

## 2017-08-14 MED ORDER — SPIRONOLACTONE 25 MG PO TABS: 25.0000 mg | ORAL_TABLET | Freq: Every day | ORAL | 0 refills | Status: DC

## 2017-08-14 MED ORDER — METFORMIN HCL 500 MG PO TABS: 500.0000 mg | ORAL_TABLET | Freq: Two times a day (BID) | ORAL | 0 refills | Status: DC

## 2017-08-14 MED ORDER — PREMIER PROTEIN SHAKE: 11.0000 [oz_av] | Freq: Two times a day (BID) | ORAL | 0 refills | Status: DC

## 2017-08-14 MED ORDER — AMPICILLIN-SULBACTAM IV (FOR PTA / DISCHARGE USE ONLY): 3.0000 g | Freq: Four times a day (QID) | INTRAVENOUS | 0 refills | Status: AC

## 2017-08-14 MED ORDER — CLOPIDOGREL BISULFATE 75 MG PO TABS: 75.0000 mg | ORAL_TABLET | Freq: Every day | ORAL | 0 refills | Status: AC

## 2017-08-14 MED ORDER — ASPIRIN 81 MG PO TBEC: 81.0000 mg | DELAYED_RELEASE_TABLET | Freq: Every day | ORAL | 0 refills | Status: AC

## 2017-08-14 MED ORDER — LISINOPRIL 20 MG PO TABS: 20.0000 mg | ORAL_TABLET | Freq: Every day | ORAL | 0 refills | Status: DC

## 2017-08-14 MED ORDER — SODIUM CHLORIDE 0.9 % IV SOLN: 3.0000 g | Freq: Four times a day (QID) | INTRAVENOUS | 0 refills | Status: AC

## 2017-08-14 MED ORDER — FUROSEMIDE 40 MG PO TABS: 40.0000 mg | ORAL_TABLET | Freq: Every day | ORAL | 0 refills | Status: DC

## 2017-08-14 NOTE — Progress Notes (Signed)
Per MD, patient to be DC'd today to [facility], verbal orders to discontinue cardiac monitoring due to patient noncompliant in leaving it on. Will continue to monitor patient.

## 2017-08-14 NOTE — Plan of Care (Signed)
  Problem: Education: Goal: Knowledge of General Education information will improve Outcome: Adequate for Discharge   Problem: Health Behavior/Discharge Planning: Goal: Ability to manage health-related needs will improve Outcome: Adequate for Discharge   Problem: Clinical Measurements: Goal: Ability to maintain clinical measurements within normal limits will improve Outcome: Adequate for Discharge Goal: Will remain free from infection Outcome: Adequate for Discharge Goal: Diagnostic test results will improve Outcome: Adequate for Discharge Goal: Respiratory complications will improve Outcome: Adequate for Discharge Goal: Cardiovascular complication will be avoided Outcome: Adequate for Discharge   Problem: Activity: Goal: Risk for activity intolerance will decrease Outcome: Adequate for Discharge   Problem: Nutrition: Goal: Adequate nutrition will be maintained Outcome: Adequate for Discharge   Problem: Coping: Goal: Level of anxiety will decrease Outcome: Adequate for Discharge   Problem: Elimination: Goal: Will not experience complications related to bowel motility Outcome: Adequate for Discharge Goal: Will not experience complications related to urinary retention Outcome: Adequate for Discharge   Problem: Pain Managment: Goal: General experience of comfort will improve Outcome: Adequate for Discharge   Problem: Safety: Goal: Ability to remain free from injury will improve Outcome: Adequate for Discharge   Problem: Skin Integrity: Goal: Risk for impaired skin integrity will decrease Outcome: Adequate for Discharge   Problem: Clinical Measurements: Goal: Ability to maintain clinical measurements within normal limits will improve Outcome: Adequate for Discharge Goal: Postoperative complications will be avoided or minimized Outcome: Adequate for Discharge   Problem: Skin Integrity: Goal: Demonstration of wound healing without infection will improve Outcome:  Adequate for Discharge

## 2017-08-14 NOTE — Discharge Instructions (Signed)
Left foot dressing changes per Dr. Irene Limbo recommendation  PICC line care per protocol

## 2017-08-14 NOTE — Clinical Social Work Note (Signed)
The patient will discharge to [facility] today via non-emergent EMS. The facility and the patient's daughter are aware and in agreement. The CSW has delivered the discharge packet. The CSW is signing off. Please consult should additional needs arise.  Argentina Ponder, MSW, Theresia Majors [phone]

## 2017-08-14 NOTE — Progress Notes (Signed)
Patient transported to [facility] via EMS in stable condition. See flowsheet for v/s. Patient being discharged on long term iv antibiotic so PICC line in place wrapped in kerlix upon discharge. Packet given to EMS, daughter aware of transport.

## 2017-08-14 NOTE — Progress Notes (Signed)
Report given to [place] at [facility]. EMS called for transport, will continue to monitor patient

## 2017-08-14 NOTE — Progress Notes (Signed)
PT Cancellation Note  Patient Details Name: Miguel Harvey MRN: [id number] DOB: 1946-03-03   Cancelled Treatment:    Reason Eval/Treat Not Completed: Other (comment)(Social worker states pt . returning to nursing home, no need. )  Patient d/c today. Returning to nursing home. Upon consult with social worker, patient is ready for d/c.  Precious Bard, PT, DPT   08/14/2017, 11:40 AM

## 2017-08-14 NOTE — Discharge Summary (Signed)
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 CHF (congestive heart failure) (HCC)   . Diabetes mellitus   . Hypertension     HOSPITAL COURSE:   77 y m with for left foot infection.  * Cardiac arrest and respiratory failure. S/p CPR and intubation-extubation - on room air now -patient in sinus rhythm  * Hypotension: resolved  * Left foot infection with osteomyelitis. 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  -IV unasyn till June 11th 2019 per ID recommendations _Dressing changes per Dr Irene Limbo recommendations  * Ac on ch systolic CHF- EF 40%   Po  lasix 40 mg daily   Also have severe high Right side pressure, manage per cardio. -on spironolactone, ACE and coreg  * PAD  s/p Percutaneous transluminal angioplasty of left peroneal artery with 3 mm diameter by 15 cm length angioplasty balloon - Percutaneous transluminal angioplasty of the left posterior tibial artery and tibioperoneal trunk with 3 mm diameter by 15 cm length angioplasty balloon -Percutaneous transluminal angioplasty of the distal left SFA with 6 mm diameter by 15 cm length Lutonix drug-coated angioplasty balloon - Self-expanding stent placement to the left distal SFA for greater than 50% residual stenosis and dissection after angioplasty with 7 mm diameter by 6 cm length life stent -cont ASA and plavix  * Hypertension: continue coreg and lisinopril.  * Diabetes.  Continue sliding scale.  -resume metformin after 48 hours (received dye for Angiogram) and cont Tradjenta  * Anemia of chronic disease. Stable Hb.  Patient comes from NH - after long discussion with CSW - this will be a difficult transition at D/C but going back to STR - hopefully they can transition him soon to Appropriate NSOC.  D/c to STR today CONSULTS OBTAINED:  Treatment Team:  Antonieta Iba, MD Schnier, Latina Craver, MD Mick Sell, MD  DRUG ALLERGIES:  No Known Allergies  DISCHARGE MEDICATIONS:   Allergies as of 08/14/2017   No Known Allergies     Medication List    STOP taking these medications   amoxicillin-clavulanate 875-125 MG tablet Commonly known as:  AUGMENTIN   glimepiride 2 MG tablet Commonly known as:  AMARYL   hydrochlorothiazide 12.5 MG tablet Commonly known as:  HYDRODIURIL   potassium chloride SA 20 MEQ tablet Commonly known as:  K-DUR,KLOR-CON     TAKE these medications   albuterol 0.63 MG/3ML nebulizer solution Commonly known as:  ACCUNEB Take 1 ampule by nebulization every 3 (three) hours as needed for wheezing.   Ampicillin-Sulbactam 3 g in sodium chloride 0.9 % 100 mL Inject 3 g into the vein every 6 (six) hours for 24 days.   ampicillin-sulbactam IVPB Commonly known as:  UNASYN Inject 3 g into the vein every 6 (six) hours for 25 days. Indication:  PAD with foot infection and OM Last Day of Therapy:  09/07/2017 Labs - Once weekly:  CBC/D, SCr, CRP   aspirin 81 MG EC tablet Take 1 tablet (  81 mg total) by mouth daily.   atorvastatin 40 MG tablet Commonly known as:  LIPITOR Take 40 mg by mouth every evening.   carvedilol 6.25 MG tablet Commonly known as:  COREG Take 6.25 mg by mouth 2 (two) times daily with a meal.   clopidogrel 75 MG tablet Commonly known as:  PLAVIX Take 1 tablet (75 mg total) by mouth daily.   furosemide 40 MG tablet Commonly known as:  LASIX Take 1 tablet (40 mg total) by mouth daily. What  changed:    medication strength  how much to take   guaiFENesin-dextromethorphan 100-10 MG/5ML syrup Commonly known as:  ROBITUSSIN DM Take 10 mLs by mouth every 4 (four) hours as needed for cough.   linagliptin 5 MG Tabs tablet Commonly known as:  TRADJENTA Take 5 mg by mouth daily.   lisinopril 20 MG tablet Commonly known as:  PRINIVIL,ZESTRIL Take 1 tablet (20 mg total) by mouth daily. What changed:    medication strength  how much to take   metFORMIN 500 MG tablet Commonly known as:  GLUCOPHAGE Take 1 tablet (500 mg total) by mouth 2 (two) times daily with a meal. Start taking on:  08/16/2017 What changed:  These instructions start on 08/16/2017. If you are unsure what to do until then, ask your doctor or other care provider.   multivitamin with minerals Tabs tablet Take 1 tablet by mouth daily.   oxyCODONE-acetaminophen 5-325 MG tablet Commonly known as:  PERCOCET/ROXICET Take 1 tablet by mouth every 8 (eight) hours as needed for moderate pain or severe pain.   protein supplement shake Liqd Commonly known as:  PREMIER PROTEIN Take 325 mLs (11 oz total) by mouth 2 (two) times daily between meals.   spironolactone 25 MG tablet Commonly known as:  ALDACTONE Take 1 tablet (25 mg total) by mouth daily.   tamsulosin 0.4 MG Caps capsule Commonly known as:  FLOMAX Take 1 capsule (0.4 mg total) by mouth daily.            Home Infusion Instuctions  (From admission, onward)        Start     Ordered   08/14/17 0000  Home infusion instructions [facility] May follow [facility] Dosing Protocol; May administer Cathflo as needed to maintain patency of vascular access device.; Flushing of vascular access device: per [facility] Protocol: 0.9% NaCl pre/post medica...    Question Answer Comment  Instructions May follow [facility] Pharm[facility] Dosing Protocol   Instructions May administer Cathflo as needed to maintain patency of vascular access device.   Instructions Flushing of  vascular access device: per [facility] Protocol: 0.9% NaCl pre/post medication administration and prn patency; Heparin 100 u/ml, 5ml for implanted ports and Heparin 10u/ml, 5ml for all other central venous catheters.   Instructions May follow [facility] Anaphylaxis Protocol for First Dose Administration in the home: 0.9% NaCl at 25-50 ml/hr to maintain IV access for protocol meds. Epinephrine 0.3 ml IV/IM PRN and Benadryl 25-50 IV/IM PRN s/s of anaphylaxis.   Instructions [facility] Infusion Coordinator (RN) to assist per patient IV care needs in the home PRN.      08/14/17 7829      If you experience worsening of your admission symptoms, develop shortness of breath, life threatening emergency, suicidal or homicidal thoughts you must seek medical attention immediately by calling 911 or calling your MD immediately  if symptoms less severe.  You Must read complete instructions/literature along with all the possible adverse reactions/side effects  for all the Medicines you take and that have been prescribed to you. Take any new Medicines after you have completely understood and accept all the possible adverse reactions/side effects.   Please note  You were cared for by a hospitalist during your hospital stay. If you have any questions about your discharge medications or the care you received while you were in the hospital after you are discharged, you can call the unit and asked to speak with the hospitalist on call if the hospitalist that took care of you is not available. Once you are discharged, your primary care physician will handle any further medical issues. Please note that NO REFILLS for any discharge medications will be authorized once you are discharged, as it is imperative that you return to your primary care physician (or establish a relationship with a primary care physician if you do not have one) for your aftercare needs so that they can reassess your need for medications and monitor your lab  values. Today   SUBJECTIVE  No new complaints   VITAL SIGNS:  Blood pressure (!) 123/91, pulse 70, temperature 97.6 F (36.4 C), temperature source Oral, resp. rate 17, height 5\' 9"  (1.753 m), weight 83.9 kg (185 lb), SpO2 100 %.  I/O:    Intake/Output Summary (Last 24 hours) at 08/14/2017 0823 Last data filed at 08/14/2017 1610 Gross per 24 hour  Intake 300 ml  Output 2800 ml  Net -2500 ml    PHYSICAL EXAMINATION:  GENERAL:  72 y.o.-year-old patient lying in the bed with no acute distress. chronically ill EYES: Pupils equal, round, reactive to light and accommodation. No scleral icterus. Extraocular muscles intact.  HEENT: Head atraumatic, normocephalic. Oropharynx and nasopharynx clear.  NECK:  Supple, no jugular venous distention. No thyroid enlargement, no tenderness.  LUNGS: Normal breath sounds bilaterally, no wheezing, rales,rhonchi or crepitation. No use of accessory muscles of respiration.  CARDIOVASCULAR: S1, S2 normal. No murmurs, rubs, or gallops.  ABDOMEN: Soft, non-tender, non-distended. Bowel sounds present. No organomegaly or mass.  EXTREMITIES: No pedal edema, cyanosis, or clubbing.left foot dressing+, right BKA +  NEUROLOGIC: Cranial nerves II through XII are intact. No focal weakness Sensation intact. Gait not checked.  PSYCHIATRIC:  patient is alert and oriented x 3.  SKIN: No obvious rash, lesion, or ulcer.   DATA REVIEW:   CBC  Recent Labs  Lab 08/12/17 0427  WBC 8.8  HGB 9.2*  HCT 28.2*  PLT 249    Chemistries  Recent Labs  Lab 08/08/17 0442  08/13/17 0537  NA 140   < > 141  K 3.3*   < > 3.8  CL 106   < > 106  CO2 25   < > 24  GLUCOSE 102*   < > 183*  BUN 12   < > 18  CREATININE 0.94   < > 1.09  CALCIUM 7.7*   < > 8.4*  MG 1.7  --   --    < > = values in this interval not displayed.    Microbiology Results   Recent Results (from the past 240 hour(s))  MRSA PCR Screening     Status: None   Collection Time: 08/05/17  5:17 PM   Result Value Ref Range Status   MRSA by PCR NEGATIVE NEGATIVE Final    Comment:        The GeneXpert MRSA Assay (FDA approved for NASAL specimens only), is one component of a comprehensive MRSA colonization surveillance program. It is not  intended to diagnose MRSA infection nor to guide or monitor treatment for MRSA infections. Performed at [facility], [address] [place], [place] [address]   Aerobic/Anaerobic Culture (surgical/deep wound)     Status: Abnormal   Collection Time: 08/06/17  2:42 PM  Result Value Ref Range Status   Specimen Description   Final    FOOT LEFT Performed at [facility], [address], [place], [place] [address]    Special Requests   Final    Immunocompromised Performed at [facility], [address] [place], [place] [address]    Gram Stain   Final    FEW WBC PRESENT,BOTH PMN AND MONONUCLEAR FEW GRAM POSITIVE COCCI RARE GRAM POSITIVE RODS    Culture (A)  Final    MULTIPLE ORGANISMS PRESENT, NONE PREDOMINANT NO ANAEROBES ISOLATED Performed at [facility], [address] [place], [place] [address]    Report Status 08/11/2017 FINAL  Final  Aerobic/Anaerobic Culture (surgical/deep wound)     Status: None (Preliminary result)   Collection Time: 08/10/17 12:52 PM  Result Value Ref Range Status   Specimen Description   Final    WOUND Performed at [facility], [address], [place], [place] [address]    Special Requests   Final    LEFT FOOT WOUND Performed at [facility], [address] [place], [place] [address]    Gram Stain   Final    ABUNDANT WBC PRESENT, PREDOMINANTLY PMN RARE GRAM POSITIVE COCCI Performed at [facility], [address], [place], [place] [address]    Culture   Final    NORMAL SKIN FLORA NO ANAEROBES ISOLATED; CULTURE IN PROGRESS FOR 5 DAYS    Report Status PENDING  Incomplete    RADIOLOGY:  Dg Chest Port 1 View  Result Date:  08/13/2017 CLINICAL DATA:  Shortness of breath. EXAM: PORTABLE CHEST 1 VIEW COMPARISON:  Radiograph of Aug 06, 2017. FINDINGS: Stable cardiomediastinal silhouette. Mild central pulmonary vascular congestion is noted with probable bilateral perihilar edema. No pneumothorax or significant pleural effusion is noted. Bony thorax is unremarkable. IMPRESSION: Mild central pulmonary vascular congestion is noted with probable bilateral perihilar edema. Electronically Signed   By: Lupita Raider, M.D.   On: 08/13/2017 14:33     Management plans discussed with the patient, family and they are in agreement.  CODE STATUS:     Code Status Orders  (From admission, onward)        Start     Ordered   08/05/17 1131  Full code  Continuous     08/05/17 1130    Code Status History    Date Active Date Inactive Code Status Order ID Comments User Context   06/29/2017 0101 07/11/2017 1619 Full Code [id number]  Therisa Doyne, MD Inpatient   05/07/2011 2229 05/14/2011 1929 Full Code [id number]  Kendell Bane, RN Inpatient    Advance Directive Documentation     Most Recent Value  Type of Advance Directive  Healthcare Power of Attorney, Living will  Pre-existing out of facility DNR order (yellow form or pink MOST form)  -  "MOST" Form in Place?  -      TOTAL TIME TAKING CARE OF THIS PATIENT: *40* minutes.    Enedina Finner M.D on 08/14/2017 at 8:23 AM  Between 7am to 6pm - Pager - [phone] After 6pm go to [email or web address] - Social research officer, government  [facility]  Office  [phone]  CC: Primary  care physician; Patient, No Pcp Per

## 2017-08-15 LAB — AEROBIC/ANAEROBIC CULTURE W GRAM STAIN (SURGICAL/DEEP WOUND): Culture: NORMAL

## 2017-08-16 ENCOUNTER — Encounter: Payer: Self-pay | Admitting: Vascular Surgery

## 2017-08-19 ENCOUNTER — Telehealth: Payer: Self-pay | Admitting: *Deleted

## 2017-08-19 NOTE — Telephone Encounter (Signed)
-----   Message from Coralee Rud sent at 08/19/2017  3:10 PM EDT ----- Regarding: tcm/ph 09/06/2017 2:30 Eula Listen, PA

## 2017-08-19 NOTE — Telephone Encounter (Signed)
No answer. Left message to call back.   

## 2017-08-20 NOTE — Telephone Encounter (Signed)
S/w patient's daughter. Patient currently at [facility] nursing home. She verbalized understanding of appointment on 09/02/17 at 2:30 pm.

## 2017-09-02 ENCOUNTER — Ambulatory Visit: Payer: Medicare Other | Admitting: Physician Assistant

## 2017-09-07 ENCOUNTER — Telehealth: Payer: Self-pay | Admitting: Physician Assistant

## 2017-09-07 NOTE — Telephone Encounter (Signed)
Lmov for patient to call back Change in schedule on 10/05/17  Need to reschedule the time of appointment  Will try again at a later time

## 2017-09-09 ENCOUNTER — Other Ambulatory Visit (INDEPENDENT_AMBULATORY_CARE_PROVIDER_SITE_OTHER): Payer: Self-pay

## 2017-09-09 DIAGNOSIS — I739 Peripheral vascular disease, unspecified: Secondary | ICD-10-CM

## 2017-09-17 ENCOUNTER — Ambulatory Visit (INDEPENDENT_AMBULATORY_CARE_PROVIDER_SITE_OTHER): Payer: Medicare Other | Admitting: Vascular Surgery

## 2017-09-17 ENCOUNTER — Other Ambulatory Visit (INDEPENDENT_AMBULATORY_CARE_PROVIDER_SITE_OTHER): Payer: Medicare Other

## 2017-09-20 ENCOUNTER — Ambulatory Visit
Admission: RE | Admit: 2017-09-20 | Discharge: 2017-09-20 | Disposition: A | Discharge status: Home / Self Care | Payer: Medicare Other | Source: Ambulatory Visit | Attending: Vascular Surgery | Admitting: Vascular Surgery

## 2017-09-20 DIAGNOSIS — Z89511 Acquired absence of right leg below knee: Secondary | ICD-10-CM | POA: Insufficient documentation

## 2017-09-20 DIAGNOSIS — I739 Peripheral vascular disease, unspecified: Secondary | ICD-10-CM | POA: Insufficient documentation

## 2017-09-21 ENCOUNTER — Ambulatory Visit (INDEPENDENT_AMBULATORY_CARE_PROVIDER_SITE_OTHER): Payer: Medicare Other | Admitting: Vascular Surgery

## 2017-09-21 ENCOUNTER — Encounter (INDEPENDENT_AMBULATORY_CARE_PROVIDER_SITE_OTHER): Payer: Self-pay | Admitting: Vascular Surgery

## 2017-09-21 VITALS — BP 75/46 | HR 77 | Resp 16 | Ht 69.0 in

## 2017-09-21 DIAGNOSIS — L97509 Non-pressure chronic ulcer of other part of unspecified foot with unspecified severity: Secondary | ICD-10-CM

## 2017-09-21 DIAGNOSIS — I10 Essential (primary) hypertension: Secondary | ICD-10-CM

## 2017-09-21 DIAGNOSIS — E11621 Type 2 diabetes mellitus with foot ulcer: Secondary | ICD-10-CM | POA: Diagnosis not present

## 2017-09-21 DIAGNOSIS — E785 Hyperlipidemia, unspecified: Secondary | ICD-10-CM | POA: Diagnosis not present

## 2017-09-21 DIAGNOSIS — F17200 Nicotine dependence, unspecified, uncomplicated: Secondary | ICD-10-CM

## 2017-09-21 DIAGNOSIS — I70269 Atherosclerosis of native arteries of extremities with gangrene, unspecified extremity: Secondary | ICD-10-CM | POA: Insufficient documentation

## 2017-09-21 NOTE — Progress Notes (Signed)
Subjective:    Patient ID: Miguel Harvey, male    DOB: 1945/07/28, 72 y.o.   MRN: [id number] Chief Complaint  Patient presents with  . New Patient (Initial Visit)    19month abi    Patient presents for his first post procedure follow-up.  The patient is status post a left lower extremity angiogram with intervention on Aug 13, 2017 for left toe gangrene.  The patient also underwent amputation of those toes by podiatry.  The patient recently followed up with podiatry and their note states the amputation site is healing well.  The patient does not offer much information during today's visit.  He is a poor historian.  The patient underwent a bilateral ABI which was conducted at [facility] as radiology department on 6/247/19 which is notable for: Right: Segmental Doppler demonstrates triphasic waveform of the right femoropopliteal segment. Left: Resting ABI within normal limits. Segmental exam of the left lower extremity demonstrates evidence of developing tibial disease. Patient denies any fever, nausea vomiting.  Review of Systems  Constitutional: Negative.   HENT: Negative.   Eyes: Negative.   Respiratory: Negative.   Cardiovascular: Negative.   Gastrointestinal: Negative.   Endocrine: Negative.   Genitourinary: Negative.   Musculoskeletal: Negative.   Skin: Positive for wound.  Allergic/Immunologic: Negative.   Neurological: Negative.   Hematological: Negative.   Psychiatric/Behavioral: Negative.       Objective:   Physical Exam  Constitutional: He is oriented to person, place, and time. He appears well-developed and well-nourished. No distress.  HENT:  Head: Normocephalic and atraumatic.  Right Ear: External ear normal.  Left Ear: External ear normal.  Eyes: Pupils are equal, round, and reactive to light. Conjunctivae and EOM are normal.  Neck: Normal range of motion.  Cardiovascular: Normal rate, regular rhythm, normal heart sounds and intact  distal pulses.  Pulses:      Radial pulses are 2+ on the right side, and 2+ on the left side.  Right BKA Left: Hard to palpate pedal pulses however the foot is warm.  Toes / amputation site is dressed and wound dressing.  Pulmonary/Chest: Effort normal and breath sounds normal.  Musculoskeletal: Normal range of motion. He exhibits no edema.  Neurological: He is alert and oriented to person, place, and time.  Skin: He is not diaphoretic.  Psychiatric: He has a normal mood and affect. His behavior is normal. Judgment and thought content normal.  Vitals reviewed.  BP (!) 75/46 (BP Location: Left Arm)   Pulse 77   Resp 16   Ht 5\' 9"  (1.753 m)   BMI 27.32 kg/m   Past Medical History:  Diagnosis Date  . CHF (congestive heart failure) (HCC)   . Diabetes mellitus   . Hypertension    Social History   Socioeconomic History  . Marital status: Single    Spouse name: Not on file  . Number of children: Not on file  . Years of education: Not on file  . Highest education level: Not on file  Occupational History  . Not on file  Social Needs  . Financial resource strain: Not on file  . Food insecurity:    Worry: Not on file    Inability: Not on file  . Transportation needs:    Medical: Not on file    Non-medical: Not on file  Tobacco Use  . Smoking status: Current Every Day Smoker    Packs/day: 2.00    Years: 40.00    Pack years:  80.00    Types: Cigarettes  . Smokeless tobacco: Former Neurosurgeon    Quit date: 05/07/2011  Substance and Sexual Activity  . Alcohol use: Yes    Alcohol/week: 3.6 oz    Types: 6 Cans of beer per week  . Drug use: Not on file  . Sexual activity: Not on file  Lifestyle  . Physical activity:    Days per week: 0 days    Minutes per session: 0 min  . Stress: Not at all  Relationships  . Social connections:    Talks on phone: Not on file    Gets together: Not on file    Attends religious service: Not on file    Active member of club or organization: Not  on file    Attends meetings of clubs or organizations: Not on file    Relationship status: Not on file  . Intimate partner violence:    Fear of current or ex partner: Not on file    Emotionally abused: Not on file    Physically abused: Not on file    Forced sexual activity: Not on file  Other Topics Concern  . Not on file  Social History Narrative  . Not on file   Past Surgical History:  Procedure Laterality Date  . AMPUTATION Right 07/02/2017   Procedure: RIGHT BELOW KNEE AMPUTATION;  Surgeon: Nadara Mustard, MD;  Location: [facility] OR;  Service: Orthopedics;  Laterality: Right;  . cyst removal from  right leg about 6 years ago    . IRRIGATION AND DEBRIDEMENT FOOT Left 08/10/2017   Procedure: IRRIGATION AND DEBRIDEMENT FOOT;  Surgeon: Gwyneth Revels, DPM;  Location: [facility] ORS;  Service: Podiatry;  Laterality: Left;  . LEFT HEART CATHETERIZATION WITH CORONARY ANGIOGRAM N/A 05/11/2011   Procedure: LEFT HEART CATHETERIZATION WITH CORONARY ANGIOGRAM;  Surgeon: Lennette Bihari, MD;  Location[facility] C[facility] LAB;  Service: Cardiovascular;  Laterality: N/A;  . LOWER EXTREMITY ANGIOGRAPHY Left 08/13/2017   Procedure: Lower Extremity Angiography and PICC line placement;  Surgeon: Annice Needy, MD;  Location: [facility] INVASIVE CV LAB;  Service: Cardiovascular;  Laterality: Left;   Family History  Problem Relation Age of Onset  . CAD Other   . Diabetes Other    No Known Allergies     Assessment & Plan:  Patient presents for his first post procedure follow-up.  The patient is status post a left lower extremity angiogram with intervention on Aug 13, 2017 for left toe gangrene.  The patient also underwent amputation of those toes by podiatry.  The patient recently followed up with podiatry and their note states the amputation site is healing well.  The patient does not offer much information during today's visit.  He is a poor historian.  The patient underwent a bilateral ABI which was conducted at [facility] as radiology department on 6/247/19 which is notable for: Right: Segmental Doppler demonstrates triphasic waveform of the right femoropopliteal segment. Left: Resting ABI within normal limits. Segmental exam of the left lower extremity demonstrates evidence of developing tibial disease. Patient denies any fever, nausea vomiting.  1. 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 ABI states normal left lower extremity arterial flow The patient does not offer much history during her visit today.  When asked if his leg is feeling better or his wound is healing the  patient answers "I do not know". The patient is to return in 6 months to continue to surveilled his peripheral artery disease and undergo an ABI left lower extremity arterial duplex I have discussed with the patient at length the risk factors for and pathogenesis of atherosclerotic disease and encouraged a healthy diet, regular exercise regimen and blood pressure / glucose control.  The patient was encouraged to call the office in the interim if he experiences any claudication like symptoms, rest pain or ulcers to his feet / toes.  - VAS [place] ABI WITH/WO TBI; Future - VAS [place] LOWER EXTREMITY ARTERIAL DUPLEX; Future  2. HTN - Stable Encouraged good control as its slows the progression of atherosclerotic disease  3. Type 2 diabetes mellitus with foot ulcer, without long-term current use of insulin (HCC) - Stable Encouraged good control as its slows the progression of atherosclerotic disease  4. TOBACCO DEPENDENCE - Stable We had a discussion for approximately five minutes regarding the absolute need for smoking cessation due to the deleterious nature of tobacco on the vascular system. We discussed the tobacco use would diminish patency of any intervention, and  likely significantly worsen progressio of disease. We discussed multiple agents for quitting including replacement therapy or medications to reduce cravings such as Chantix. The patient voices their understanding of the importance of smoking cessation.  5. Dyslipidemia - Stable Encouraged good control as its slows the progression of atherosclerotic disease  Current Outpatient Medications on File Prior to Visit  Medication Sig Dispense Refill  . albuterol (ACCUNEB) 0.63 MG/3ML nebulizer solution Take 1 ampule by nebulization every 3 (three) hours as needed for wheezing.    Marland Kitchen aspirin EC 81 MG EC tablet Take 1 tablet (81 mg total) by mouth daily. 30 tablet 0  . atorvastatin (LIPITOR) 40 MG tablet Take 40 mg by mouth every evening.    . carvedilol (COREG) 6.25 MG tablet Take 6.25 mg by mouth 2 (two) times daily with a meal.    . clopidogrel (PLAVIX) 75 MG tablet Take 1 tablet (75 mg total) by mouth daily. 30 tablet 0  . furosemide (LASIX) 40 MG tablet Take 1 tablet (40 mg total) by mouth daily. 30 tablet 0  . guaiFENesin-dextromethorphan (ROBITUSSIN DM) 100-10 MG/5ML syrup Take 10 mLs by mouth every 4 (four) hours as needed for cough.    . linagliptin (TRADJENTA) 5 MG TABS tablet Take 5 mg by mouth daily.    Marland Kitchen lisinopril (PRINIVIL,ZESTRIL) 20 MG tablet Take 1 tablet (20 mg total) by mouth daily. 30 tablet 0  . metFORMIN (GLUCOPHAGE) 500 MG tablet Take 1 tablet (500 mg total) by mouth 2 (two) times daily with a meal. 30 tablet 0  . Multiple Vitamin (MULTIVITAMIN WITH MINERALS) TABS tablet Take 1 tablet by mouth daily.    Marland Kitchen oxyCODONE-acetaminophen (PERCOCET/ROXICET) 5-325 MG tablet Take 1 tablet by mouth every 8 (eight) hours as needed for moderate pain or severe pain. 10 tablet 0  . potassium chloride SA (K-DUR,KLOR-CON) 20 MEQ tablet Take 20 mEq by mouth 2 (two) times daily.    . protein supplement shake (PREMIER PROTEIN) LIQD Take 325 mLs (11 oz total) by mouth 2 (two) times daily between meals. 30  Can 0  . sertraline (ZOLOFT) 50 MG tablet Take 50 mg by mouth daily.    Marland Kitchen spironolactone (ALDACTONE) 25 MG tablet Take 1 tablet (25 mg total) by mouth daily. 30 tablet 0  . tamsulosin (FLOMAX) 0.4 MG CAPS capsule Take 1 capsule (0.4 mg total) by  mouth daily. 30 capsule 0   No current facility-administered medications on file prior to visit.     There are no Patient Instructions on file for this visit. No follow-ups on file.   Mackenna Kamer A Emmilyn Crooke, PA-C

## 2017-10-02 NOTE — Progress Notes (Deleted)
Cardiology Office Note Date:  10/05/2017  Patient ID:  Miguel Harvey, Miguel Harvey March 29, 1946, MRN [id number] PCP:  Patient, No Pcp Per  Cardiologist:  Dr. Mariah Milling, MD  ***refresh   Chief Complaint: Hospital follow up   History of Present Illness: Miguel Harvey is a 72 y.o. male with history of nonobstructive CAD by LHC in 05/2011 with 10-20% ostial LAD stenosis at that time, HFrEF due to NICM, pulmonary hypertension, NSVT, recent PEA arrest, PAD s/p right BKA s/p recent balloon angioplasty fo the left peroneal artery, left posterior tibial artery, tibioperoneal trunk, and stenting to the distal left SFA in 07/2017, osteomyelitis of the left foot, DM2, HTN, HLD, anemia, tobacco abuse, and obesity who presents for hospital follow up after admission to [facility] from 5/9 to 5/18 for osteomyelitis from a diabetic foot infection complicated by PEA arrest during anesthesia induction felt to be secondary to decompensated heart failure.   Echo from 05/08/2011 showed an EF of 50-55%, mild LVH, normal wall motion, Gr2DD, mildly dilated LA. Repeat echo on 05/12/2011 showed an EF of 40-45%, mild concentric LVH, global HK, no evidence of LV thrombus. Cath in 05/2011 showed minimal CAD with 10-20% ostial LAD stenosis. Patient was admitted to the hospital in 06/2017 with sepsis from a diabetic foot infection. Echo on 06/30/2017 showed an EF of 40%, diffuse HK, Gr2DD, mild MR, mildly dilated LA, RV cavity size normal with normal RVSF, PASP 63 mmHg. Repeat echo on 07/03/2017 showed an EF of 25-30%, diffuse HK, mild LVH, mild MR, mild PASP. No ischemic evaluation was performed at that time. Patient was admitted to [facility] in 07/2017 with left foot osteomyelitis. While he was undergoing anesthesia induction for metatarsal amputation he suffered a PEA arrest, felt to be in the setting decompensated heart failure. Echo on 08/06/2017 showed an EF of 20-25%, diffuse HK, moderate MR, mildly dilated LA, mildly dilated RV with mildly reduced RVSF, mildly  dilated RA, PASP 68 mmHg. He was IV diuresed with a discharge weight of 185 pounds. Outpatient ischemic evaluation was advised to be considered. He was not felt to be a good candidate for ICD given his comorbid conditions and overall poor prognosis. Cardiac discharge medications included ASA 81 mg, Lipitor 40 mg, Coreg 6.25 mg bid, Plavix 75 mg (PAD), Lasix 40 mg daily, lisinopril 20 mg, spironolactone 25 mg. Discharge SCr of 1.09, K+ 3.8, TSH 9.524, HGB 9.9, A1c 9.5.   ***   Past Medical History:  Diagnosis Date  . Coronary artery disease, non-occlusive    a. LHC 05/2011: 10-20% ostial LAD stenosis  . Diabetes mellitus   . HLD (hyperlipidemia)   . Hypertension   . NICM (nonischemic cardiomyopathy) (HCC)    a. TTE 2/213: EF 50-55%, mild LVH, normal wall motion, Gr2DD, mildly dilated LA; b. TTE 2/13: EF 40-45%, mild concentric LVH, global HK, no evidence of LV thrombus; c. TTE 4/19: EF 40%, diffuse HK, Gr2DD, mild MR, mildly dilated LA, RV cavity size normal w/ normal RVSF, PASP 63; d. TTE 4/19: EF 25-30%, diffuse HK, mild LVH, mild MR, mild PASP; e. TTE 5/19: EF 20-25%, diffuse HK, moderate MR,  . NSVT (nonsustained ventricular tachycardia) (HCC)   . PAD (peripheral artery disease) (HCC)    a. s/p right BKA s/p  recent balloon angioplasty fo the left peroneal artery, left posterior tibial artery, tibioperoneal trunk, and stenting to the distal left SFA in 07/2017  . PEA (Pulseless electrical activity) [facility])     Past Surgical History:  Procedure Laterality  Date  . AMPUTATION Right 07/02/2017   Procedure: RIGHT BELOW KNEE AMPUTATION;  Surgeon: Nadara Mustard, MD;  Location: [facility] OR;  Service: Orthopedics;  Laterality: Right;  . cyst removal from  right leg about 6 years ago    . IRRIGATION AND DEBRIDEMENT FOOT Left 08/10/2017   Procedure: IRRIGATION AND DEBRIDEMENT FOOT;  Surgeon: Gwyneth Revels, DPM;  Location: [facility] ORS;  Service: Podiatry;  Laterality: Left;  . LEFT HEART CATHETERIZATION WITH  CORONARY ANGIOGRAM N/A 05/11/2011   Procedure: LEFT HEART CATHETERIZATION WITH CORONARY ANGIOGRAM;  Surgeon: Lennette Bihari, MD;  Location: [facility] CATH LAB;  Service: Cardiovascular;  Laterality: N/A;  . LOWER EXTREMITY ANGIOGRAPHY Left 08/13/2017   Procedure: Lower Extremity Angiography and PICC line placement;  Surgeon: Annice Needy, MD;  Location: [facility] INVASIVE CV LAB;  Service: Cardiovascular;  Laterality: Left;    No outpatient medications have been marked as taking for the 10/05/17 encounter (Appointment) with Sondra Barges, PA-C.    Allergies:   Patient has no known allergies.   Social History:  The patient  reports that he has been smoking cigarettes.  He has a 80.00 pack-year smoking history. He quit smokeless tobacco use about 6 years ago. He reports that he drinks about 3.6 oz of alcohol per week.   Family History:  The patient's family history includes CAD in his other; Diabetes in his other.  ROS:   ROS   PHYSICAL EXAM: *** VS:  There were no vitals taken for this visit. BMI: There is no height or weight on file to calculate BMI.  Physical Exam   EKG:  Was ordered and interpreted by me today. Shows ***  Recent Labs: 07/30/2017: B Natriuretic Peptide 1,627.0 08/05/2017: ALT 30 08/08/2017: Magnesium 1.7 08/09/2017: TSH 9.524 08/12/2017: Hemoglobin 9.2; Platelets 249 08/13/2017: BUN 18; Creatinine, Ser 1.09; Potassium 3.8; Sodium 141  08/12/2017: Triglycerides 69   CrCl cannot be calculated (Patient's most recent lab result is older than the maximum 21 days allowed.).   Wt Readings from Last 3 Encounters:  08/14/17 185 lb (83.9 kg)  07/10/17 221 lb 12.5 oz (100.6 kg)  12/23/16 225 lb (102.1 kg)     Other studies reviewed: Additional studies/records reviewed today include: summarized above  ASSESSMENT AND PLAN:  1. ***  Disposition: F/u with *** in   Current medicines are reviewed at length with the patient today.  The patient did not have any concerns regarding  medicines.  Signed, Eula Listen, PA-C 10/05/2017 7:31 AM     [facility] [place] [address] [place], [place] [address] [phone]

## 2017-10-05 ENCOUNTER — Encounter: Payer: Self-pay | Admitting: Physician Assistant

## 2017-10-05 ENCOUNTER — Ambulatory Visit: Payer: Medicare Other | Admitting: Physician Assistant

## 2017-10-06 ENCOUNTER — Encounter: Payer: Self-pay | Admitting: Physician Assistant

## 2017-10-13 NOTE — Progress Notes (Signed)
Cardiology Office Note Date:  10/14/2017  Patient ID:  Miguel Harvey, Miguel Harvey 1945-12-17, MRN [id number] PCP:  Patient, No Pcp Per  Cardiologist:  Dr. Mariah Milling, MD    Chief Complaint: Hospital follow up  History of Present Illness: DAYLN TUGWELL is a 72 y.o. male with history of nonobstructive CAD by LHC in 05/2011 with 10-20% ostial LAD stenosis at that time, HFrEF due to NICM, pulmonary hypertension, NSVT, recent PEA arrest, PAD s/p right BKA s/p recent balloon angioplasty to the left peroneal artery, left posterior tibial artery, tibioperoneal trunk, and stenting to the distal left SFA in 07/2017, osteomyelitis of the left foot, DM2, HTN, HLD, anemia, tobacco abuse, and obesity who presents for hospital follow up after admission to [facility] from 5/9 to 5/18 for osteomyelitis from a diabetic foot infection complicated by PEA arrest during anesthesia induction felt to be secondary to decompensated heart failure.   Echo from 05/08/2011 showed an EF of 50-55%, mild LVH, normal wall motion, Gr2DD, mildly dilated LA. Repeat echo on 05/12/2011 showed an EF of 40-45%, mild concentric LVH, global HK, no evidence of LV thrombus. Cath in 05/2011 showed minimal CAD with 10-20% ostial LAD stenosis. Patient was admitted to the hospital in 06/2017 with sepsis from a diabetic foot infection. Echo on 06/30/2017 showed an EF of 40%, diffuse HK, Gr2DD, mild MR, mildly dilated LA, RV cavity size normal with normal RVSF, PASP 63 mmHg. Repeat echo on 07/03/2017 showed an EF of 25-30%, diffuse HK, mild LVH, mild MR, mild PASP. No ischemic evaluation was performed at that time. Patient was admitted to [facility] in 07/2017 with left foot osteomyelitis. While he was undergoing anesthesia induction for metatarsal amputation he suffered a PEA arrest, felt to be in the setting decompensated heart failure. Echo on 08/06/2017 showed an EF of 20-25%, diffuse HK, moderate MR, mildly dilated LA, mildly dilated RV with mildly reduced RVSF, mildly dilated RA,  PASP 68 mmHg. He was IV diuresed with a discharge weight of 185 pounds. Outpatient ischemic evaluation was advised to be considered. He was not felt to be a good candidate for ICD given his comorbid conditions and overall poor prognosis. Cardiac discharge medications included ASA 81 mg, Lipitor 40 mg, Coreg 6.25 mg bid, Plavix 75 mg (PAD), Lasix 40 mg daily, lisinopril 20 mg, spironolactone 25 mg. Discharge SCr of 1.09, K+ 3.8, TSH 9.524, HGB 9.9, A1c 9.5.   In follow-up with vascular, patient's blood pressure was noted to be 75/46, no changes were made.  Most recent blood pressure from 10/07/2017 with podiatry noted to be 102/64.  Patient comes in accompanied by his daughter and grandson today.  Patient indicates he is doing "fine."  He has not had any chest pain, shortness of breath, palpitations, dizziness, presyncope, or syncope.  No orthopnea, PND, abdominal distention, or early satiety.  He does note a chronic dry cough that has been present for many years and is unchanged.  He indicates he is limiting his salt intake though is uncertain how much fluid he is drinking on a daily basis.  He is tolerating aspirin, Lipitor, carvedilol, Plavix, Lasix, lisinopril, and spironolactone along with his noncardiac medications.  No falls, BRBPR, or melena.  No recent weights given patient is unable to stand secondary to his BKA.  He does not have any concerns at this time.  Patient's daughter indicates that she truly does not know how her father is feeling secondary to he always reports "I'm fine."  Past Medical History:  Diagnosis  Date  . Coronary artery disease, non-occlusive    a. LHC 05/2011: 10-20% ostial LAD stenosis  . Diabetes mellitus   . HLD (hyperlipidemia)   . Hypertension   . NICM (nonischemic cardiomyopathy) (HCC)    a. TTE 2/213: EF 50-55%, mild LVH, normal wall motion, Gr2DD, mildly dilated LA; b. TTE 2/13: EF 40-45%, mild concentric LVH, global HK, no evidence of LV thrombus; c. TTE 4/19: EF  40%, diffuse HK, Gr2DD, mild MR, mildly dilated LA, RV cavity size normal w/ normal RVSF, PASP 63; d. TTE 4/19: EF 25-30%, diffuse HK, mild LVH, mild MR, mild PASP; e. TTE 5/19: EF 20-25%, diffuse HK, moderate MR,  . NSVT (nonsustained ventricular tachycardia) (HCC)   . PAD (peripheral artery disease) (HCC)    a. s/p right BKA s/p  recent balloon angioplasty fo the left peroneal artery, left posterior tibial artery, tibioperoneal trunk, and stenting to the distal left SFA in 07/2017  . PEA (Pulseless electrical activity) [facility])     Past Surgical History:  Procedure Laterality Date  . AMPUTATION Right 07/02/2017   Procedure: RIGHT BELOW KNEE AMPUTATION;  Surgeon: Nadara Mustard, MD;  Location: [facility] OR;  Service: Orthopedics;  Laterality: Right;  . cyst removal from  right leg about 6 years ago    . IRRIGATION AND DEBRIDEMENT FOOT Left 08/10/2017   Procedure: IRRIGATION AND DEBRIDEMENT FOOT;  Surgeon: Gwyneth Revels, DPM;  Location: [facility] ORS;  Service: Podiatry;  Laterality: Left;  . LEFT HEART CATHETERIZATION WITH CORONARY ANGIOGRAM N/A 05/11/2011   Procedure: LEFT HEART CATHETERIZATION WITH CORONARY ANGIOGRAM;  Surgeon: Lennette Bihari, MD;  Location: [facility] CATH LAB;  Service: Cardiovascular;  Laterality: N/A;  . LOWER EXTREMITY ANGIOGRAPHY Left 08/13/2017   Procedure: Lower Extremity Angiography and PICC line placement;  Surgeon: Annice Needy, MD;  Location: [facility] INVASIVE CV LAB;  Service: Cardiovascular;  Laterality: Left;    Current Meds  Medication Sig  . albuterol (ACCUNEB) 0.63 MG/3ML nebulizer solution Take 1 ampule by nebulization every 3 (three) hours as needed for wheezing.  Marland Kitchen aspirin EC 81 MG EC tablet Take 1 tablet (81 mg total) by mouth daily.  Marland Kitchen atorvastatin (LIPITOR) 40 MG tablet Take 40 mg by mouth every evening.  . carvedilol (COREG) 6.25 MG tablet Take 6.25 mg by mouth 2 (two) times daily with a meal.  . clopidogrel (PLAVIX) 75 MG tablet Take 1 tablet (75 mg total) by mouth daily.  .  furosemide (LASIX) 40 MG tablet Take 1 tablet (40 mg total) by mouth daily.  Marland Kitchen guaiFENesin-dextromethorphan (ROBITUSSIN DM) 100-10 MG/5ML syrup Take 10 mLs by mouth every 4 (four) hours as needed for cough.  . linagliptin (TRADJENTA) 5 MG TABS tablet Take 5 mg by mouth daily.  Marland Kitchen lisinopril (PRINIVIL,ZESTRIL) 20 MG tablet Take 1 tablet (20 mg total) by mouth daily.  . metFORMIN (GLUCOPHAGE) 500 MG tablet Take 1 tablet (500 mg total) by mouth 2 (two) times daily with a meal.  . Multiple Vitamin (MULTIVITAMIN WITH MINERALS) TABS tablet Take 1 tablet by mouth daily.  Marland Kitchen oxyCODONE-acetaminophen (PERCOCET/ROXICET) 5-325 MG tablet Take 1 tablet by mouth every 8 (eight) hours as needed for moderate pain or severe pain.  . potassium chloride SA (K-DUR,KLOR-CON) 20 MEQ tablet Take 20 mEq by mouth 2 (two) times daily.  . protein supplement shake (PREMIER PROTEIN) LIQD Take 325 mLs (11 oz total) by mouth 2 (two) times daily between meals.  . sertraline (ZOLOFT) 50 MG tablet Take 50 mg by mouth daily.  Marland Kitchen  spironolactone (ALDACTONE) 25 MG tablet Take 1 tablet (25 mg total) by mouth daily.  . tamsulosin (FLOMAX) 0.4 MG CAPS capsule Take 1 capsule (0.4 mg total) by mouth daily.    Allergies:   Patient has no known allergies.   Social History:  The patient  reports that he has been smoking cigarettes.  He has a 80.00 pack-year smoking history. He quit smokeless tobacco use about 6 years ago. He reports that he drinks about 3.6 oz of alcohol per week.   Family History:  The patient's family history includes CAD in his other; Diabetes in his other.  ROS:   Review of Systems  Constitutional: Positive for malaise/fatigue. Negative for chills, diaphoresis, fever and weight loss.  HENT: Negative for congestion.   Eyes: Negative for discharge and redness.  Respiratory: Positive for cough. Negative for hemoptysis, sputum production, shortness of breath and wheezing.   Cardiovascular: Negative for chest pain,  palpitations, orthopnea, claudication, leg swelling and PND.  Gastrointestinal: Negative for abdominal pain, blood in stool, heartburn, melena, nausea and vomiting.  Genitourinary: Negative for hematuria.  Musculoskeletal: Negative for falls and myalgias.  Skin: Negative for rash.  Neurological: Positive for weakness. Negative for dizziness, tingling, tremors, sensory change, speech change, focal weakness and loss of consciousness.  Endo/Heme/Allergies: Does not bruise/bleed easily.  Psychiatric/Behavioral: Negative for substance abuse. The patient is not nervous/anxious.   All other systems reviewed and are negative.    PHYSICAL EXAM:  VS:  BP 104/60 (BP Location: Left Arm, Patient Position: Sitting, Cuff Size: Normal)   Pulse 83   Ht 5\' 9"  (1.753 m)   BMI 27.32 kg/m  BMI: Body mass index is 27.32 kg/m.  Physical Exam  Constitutional: He is oriented to person, place, and time. He appears well-developed and well-nourished.  HENT:  Head: Normocephalic and atraumatic.  Eyes: Right eye exhibits no discharge. Left eye exhibits no discharge.  Neck: Normal range of motion. No JVD present.  Cardiovascular: Normal rate, regular rhythm, S1 normal and S2 normal. Exam reveals no distant heart sounds, no friction rub, no midsystolic click and no opening snap.  Murmur heard. High-pitched blowing holosystolic murmur is present with a grade of 1/6 at the apex. Pulses:      Posterior tibial pulses are 1+ on the left side.  Pulmonary/Chest: Effort normal and breath sounds normal. No respiratory distress. He has no decreased breath sounds. He has no wheezes. He has no rales. He exhibits no tenderness.  Abdominal: Soft. He exhibits no distension. There is no tenderness.  Musculoskeletal: He exhibits no edema.  Status post right BKA  Neurological: He is alert and oriented to person, place, and time.  Skin: Skin is warm and dry. No cyanosis. Nails show no clubbing.  Psychiatric: He has a normal mood  and affect. His speech is normal and behavior is normal. Judgment and thought content normal.     EKG:  Was ordered and interpreted by me today. Shows NSR, 83 bpm, occasional PVCs rare couplet formation, no acute ST-T changes  Recent Labs: 07/30/2017: B Natriuretic Peptide 1,627.0 08/05/2017: ALT 30 08/08/2017: Magnesium 1.7 08/09/2017: TSH 9.524 08/12/2017: Hemoglobin 9.2; Platelets 249 08/13/2017: BUN 18; Creatinine, Ser 1.09; Potassium 3.8; Sodium 141  08/12/2017: Triglycerides 69   CrCl cannot be calculated (Patient's most recent lab result is older than the maximum 21 days allowed.).   Wt Readings from Last 3 Encounters:  08/14/17 185 lb (83.9 kg)  07/10/17 221 lb 12.5 oz (100.6 kg)  12/23/16 225 lb (102.1  kg)     Other studies reviewed: Additional studies/records reviewed today include: summarized above  ASSESSMENT AND PLAN:  1. HFrEf due to NICM: He does not appear grossly volume overloaded at this time.  No weight secondary to his inability to stand secondary to right BKA.  Lungs are clear to auscultation bilaterally, no JVD, no left lower extremity swelling or right stump swelling.  No abdominal distention.  He remains on carvedilol 6.25 mg twice daily, Lasix 40 mg daily, lisinopril 20 mg daily, and spironolactone 25 mg daily.  His relative hypotension precludes escalation of evidence-based heart failure therapy, or addition of Entresto in place of his lisinopril, after washout, at this time.  I did discuss with the patient and his daughter that we would attempt to escalate evidence-based heart failure therapy as his BP/heart rate allow.  CHF education was discussed with the patient in detail.  Check labs as below.  2. History of elevated troponin/PEA arrest: Felt to be in the setting of decompensated heart failure with anesthesia induction.  No symptoms of chest pain.  Schedule Lexiscan Myoview to evaluate for high risk ischemia.  Optimize heart failure therapy as vital signs allow as  above.  3. NSVT/PVCs: Asymptomatic.  Continue carvedilol 6.25 mg twice daily.  Relative hypotension precludes escalation of beta-blocker therapy at this time.  Check TSH, BMP, and magnesium.  Recommendation to maintain potassium and magnesium greater than 4.0 and 2.0, respectively.  No indication for antiarrhythmic therapy at this time.  Patient has been felt to not be a good candidate for ICD therapy given his severe comorbid conditions.  4. Mitral regurgitation: Continue to monitor with periodic echocardiogram.  Given his comorbid conditions, it remains uncertain if he would be a candidate for valve replacement/repair should his regurgitation progress.  5. HTN: Blood pressure is well controlled today at 104/60.  His relative hypotension precludes escalation of evidence-based heart failure therapy as above.  Continue carvedilol, Lasix, lisinopril, and spironolactone.  Check BMP, CBC, magnesium, and TSH.  6. PAD: Followed by vascular. Optimal BP/HR/lipid control.  Recommend lipid panel at follow-up.  7. Osteomyelitis: Per PCP/podiatry.   8. DM2: Per PCP.  Disposition: F/u with Dr. Mariah Milling in 3 months.  Current medicines are reviewed at length with the patient today.  The patient did not have any concerns regarding medicines.  Signed, Eula Listen, PA-C 10/14/2017 10:27 AM     [facility] [address] [place], [place] [address] [phone]

## 2017-10-14 ENCOUNTER — Encounter: Payer: Self-pay | Admitting: Physician Assistant

## 2017-10-14 ENCOUNTER — Ambulatory Visit (INDEPENDENT_AMBULATORY_CARE_PROVIDER_SITE_OTHER): Payer: Medicare Other | Admitting: Physician Assistant

## 2017-10-14 VITALS — BP 104/60 | HR 83 | Ht 69.0 in

## 2017-10-14 DIAGNOSIS — I10 Essential (primary) hypertension: Secondary | ICD-10-CM

## 2017-10-14 DIAGNOSIS — I469 Cardiac arrest, cause unspecified: Secondary | ICD-10-CM

## 2017-10-14 DIAGNOSIS — I472 Ventricular tachycardia: Secondary | ICD-10-CM

## 2017-10-14 DIAGNOSIS — I340 Nonrheumatic mitral (valve) insufficiency: Secondary | ICD-10-CM | POA: Diagnosis not present

## 2017-10-14 DIAGNOSIS — I428 Other cardiomyopathies: Secondary | ICD-10-CM

## 2017-10-14 DIAGNOSIS — M869 Osteomyelitis, unspecified: Secondary | ICD-10-CM | POA: Diagnosis not present

## 2017-10-14 DIAGNOSIS — I4729 Other ventricular tachycardia: Secondary | ICD-10-CM

## 2017-10-14 DIAGNOSIS — I493 Ventricular premature depolarization: Secondary | ICD-10-CM | POA: Diagnosis not present

## 2017-10-14 DIAGNOSIS — R7989 Other specified abnormal findings of blood chemistry: Secondary | ICD-10-CM

## 2017-10-14 DIAGNOSIS — I5022 Chronic systolic (congestive) heart failure: Secondary | ICD-10-CM

## 2017-10-14 DIAGNOSIS — I739 Peripheral vascular disease, unspecified: Secondary | ICD-10-CM

## 2017-10-14 DIAGNOSIS — R778 Other specified abnormalities of plasma proteins: Secondary | ICD-10-CM

## 2017-10-14 DIAGNOSIS — R748 Abnormal levels of other serum enzymes: Secondary | ICD-10-CM | POA: Diagnosis not present

## 2017-10-14 NOTE — Patient Instructions (Signed)
Labwork: Labs done today Bmet, CBC, and Mag. We will call you with those results.   Testing/Procedures: [facility] MYOVIEW  Your caregiver has ordered a Stress Test with nuclear imaging. The purpose of this test is to evaluate the blood supply to your heart muscle. This procedure is referred to as a "Non-Invasive Stress Test." This is because other than having an IV started in your vein, nothing is inserted or "invades" your body. Cardiac stress tests are done to find areas of poor blood flow to the heart by determining the extent of coronary artery disease (CAD). Some patients exercise on a treadmill, which naturally increases the blood flow to your heart, while others who are  unable to walk on a treadmill due to physical limitations have a pharmacologic/chemical stress agent called Lexiscan . This medicine will mimic walking on a treadmill by temporarily increasing your coronary blood flow.   Please note: these test may take anywhere between 2-4 hours to complete  PLEASE REPORT TO [facility] MEDICAL MALL ENTRANCE  THE VOLUNTEERS AT THE FIRST DESK WILL DIRECT YOU WHERE TO GO  Date of Procedure:_____________________________________  Arrival Time for Procedure:______________________________  Instructions regarding medication:   __XX__ : Hold diabetes medication morning of procedure Metformin  _XX___:  Hold Carvedilol the night before procedure and morning of procedure  _XX__:  Hold fluid pill the morning of procedure as well. Furosemide (Lasix)  PLEASE NOTIFY THE OFFICE AT LEAST 24 HOURS IN ADVANCE IF YOU ARE UNABLE TO KEEP YOUR APPOINTMENT.  [phone] AND  PLEASE NOTIFY NUCLEAR MEDICINE AT [facility] AT LEAST 24 HOURS IN ADVANCE IF YOU ARE UNABLE TO KEEP YOUR APPOINTMENT. [phone]  How to prepare for your Myoview test:  1. Do not eat or drink after midnight 2. No caffeine for 24 hours prior to test 3. No smoking 24 hours prior to test. 4. Your medication may be taken with water.  If your  doctor stopped a medication because of this test, do not take that medication. 5. Ladies, please do not wear dresses.  Skirts or pants are appropriate. Please wear a short sleeve shirt. 6. No perfume, cologne or lotion. 7. Wear comfortable walking shoes. No heels!   Follow-Up: Your physician recommends that you schedule a follow-up appointment in: 2 months with Dr. Mariah Milling or Eula Listen PA.  It was a pleasure seeing you today here in the office. Please do not hesitate to give [place] a call back if you have any further questions. [phone]  Joy Cellar RN, BSN

## 2017-10-15 ENCOUNTER — Telehealth: Payer: Self-pay | Admitting: *Deleted

## 2017-10-15 LAB — BASIC METABOLIC PANEL
BUN/Creatinine Ratio: 27 — ABNORMAL HIGH (ref 10–24)
BUN: 29 mg/dL — ABNORMAL HIGH (ref 8–27)
CHLORIDE: 106 mmol/L (ref 96–106)
CO2: 20 mmol/L (ref 20–29)
Calcium: 9.4 mg/dL (ref 8.6–10.2)
Creatinine, Ser: 1.07 mg/dL (ref 0.76–1.27)
GFR calc non Af Amer: 69 mL/min/{1.73_m2} (ref >59)
GFR, EST AFRICAN AMERICAN: 80 mL/min/{1.73_m2} (ref >59)
Glucose: 139 mg/dL — ABNORMAL HIGH (ref 65–99)
Potassium: 5.7 mmol/L — ABNORMAL HIGH (ref 3.5–5.2)
Sodium: 140 mmol/L (ref 134–144)

## 2017-10-15 LAB — CBC WITH DIFFERENTIAL/PLATELET
BASOS ABS: 0 10*3/uL (ref 0.0–0.2)
Basos: 0 %
EOS (ABSOLUTE): 0.3 10*3/uL (ref 0.0–0.4)
Eos: 4 %
HEMATOCRIT: 32.9 % — AB (ref 37.5–51.0)
HEMOGLOBIN: 10.3 g/dL — AB (ref 13.0–17.7)
IMMATURE GRANS (ABS): 0 10*3/uL (ref 0.0–0.1)
Immature Granulocytes: 0 %
Lymphocytes Absolute: 1.9 10*3/uL (ref 0.7–3.1)
Lymphs: 26 %
MCH: 25 pg — AB (ref 26.6–33.0)
MCHC: 31.3 g/dL — AB (ref 31.5–35.7)
MCV: 80 fL (ref 79–97)
MONOCYTES: 8 %
Monocytes Absolute: 0.6 10*3/uL (ref 0.1–0.9)
NEUTROS ABS: 4.5 10*3/uL (ref 1.4–7.0)
Neutrophils: 62 %
Platelets: 212 10*3/uL (ref 150–450)
RBC: 4.12 x10E6/uL — AB (ref 4.14–5.80)
RDW: 18.2 % — AB (ref 12.3–15.4)
WBC: 7.3 10*3/uL (ref 3.4–10.8)

## 2017-10-15 LAB — MAGNESIUM: MAGNESIUM: 1.6 mg/dL (ref 1.6–2.3)

## 2017-10-15 LAB — TSH: TSH: 4.84 u[IU]/mL — ABNORMAL HIGH (ref 0.450–4.500)

## 2017-10-15 NOTE — Telephone Encounter (Signed)
Called patients contact and nurse was not available at this time. Will call back after lunch to try and reach someone.

## 2017-10-15 NOTE — Telephone Encounter (Signed)
Spoke with Bianca LPN at [facility] to see if they had the results back from the repeat potassium. She states that they came about a hour ago to have it done and results are still pending. Requested that if those results are abnormal to please call our answering service for further recommendations. She verbalized understanding with no further questions at this time.

## 2017-10-15 NOTE — Telephone Encounter (Signed)
Spoke with Marylene Land LPN taking care of Mr. Fed Villafuerte over at [facility]. Reviewed results and recommendations with her to hold lisinopril and spironolactone and to have repeat labs this afternoon to recheck that potassium. Also reviewed that he should follow up with PCP. She said they would get the STAT lab test done and fax it over to [place]. Provided her with our fax number. She had no further questions at this time.

## 2017-10-15 NOTE — Telephone Encounter (Signed)
-----   Message from Sondra Barges, PA-C sent at 10/15/2017 12:23 PM EDT ----- Random glucose elevated, though improved - follow up with PCP Renal function normal and stable Potassium is elevated at 5.7 HGB low, though stable Thyroid function elevated - follow up with PCP Magnesium low normal - start magnesium oxide 400 mg daily  Hold spironolactone and lisinopril given hyperkalemia. Patient needs a stat potassium level this afternoon to be reviewed by provider in the office If he cannot get this done in the office he will need to go to the ED for hyperkalemia

## 2017-10-18 NOTE — Telephone Encounter (Signed)
Noted  

## 2017-10-21 NOTE — Telephone Encounter (Signed)
Patient daughter returning call for lab results

## 2017-10-21 NOTE — Telephone Encounter (Signed)
Patient's daughter was called back. She stated she was returning a call about labs from a few weeks ago.  She has been informed that the facility has been updated on the labs. She stated she needed nothing further.

## 2017-10-22 ENCOUNTER — Telehealth: Payer: Self-pay | Admitting: *Deleted

## 2017-10-22 MED ORDER — LISINOPRIL 10 MG PO TABS: 10.0000 mg | ORAL_TABLET | Freq: Every day | ORAL | Status: DC

## 2017-10-22 NOTE — Telephone Encounter (Signed)
Fax received from the [facility] with potassium of 4.6. Per Eula Listen PA, the patient should continue to hold spironolactone and can restart the Lisinopril at 10 mg daily. He should have a BMET in one week.   Marylene Land at [facility] has been made aware and verbalized her understanding.

## 2017-10-27 ENCOUNTER — Telehealth (INDEPENDENT_AMBULATORY_CARE_PROVIDER_SITE_OTHER): Payer: Self-pay | Admitting: Orthopedic Surgery

## 2017-10-27 NOTE — Telephone Encounter (Signed)
Last OV note faxed to [facility] [phone]

## 2017-11-01 ENCOUNTER — Ambulatory Visit
Admission: RE | Admit: 2017-11-01 | Discharge: 2017-11-01 | Disposition: A | Discharge status: Home / Self Care | Payer: Medicare Other | Source: Ambulatory Visit | Attending: Physician Assistant | Admitting: Physician Assistant

## 2017-11-01 DIAGNOSIS — Z8674 Personal history of sudden cardiac arrest: Secondary | ICD-10-CM | POA: Insufficient documentation

## 2017-11-01 DIAGNOSIS — I469 Cardiac arrest, cause unspecified: Secondary | ICD-10-CM | POA: Diagnosis not present

## 2017-11-01 DIAGNOSIS — Z89511 Acquired absence of right leg below knee: Secondary | ICD-10-CM | POA: Insufficient documentation

## 2017-11-01 DIAGNOSIS — Z87891 Personal history of nicotine dependence: Secondary | ICD-10-CM | POA: Insufficient documentation

## 2017-11-01 DIAGNOSIS — I2510 Atherosclerotic heart disease of native coronary artery without angina pectoris: Secondary | ICD-10-CM | POA: Insufficient documentation

## 2017-11-01 DIAGNOSIS — I428 Other cardiomyopathies: Secondary | ICD-10-CM | POA: Diagnosis not present

## 2017-11-01 DIAGNOSIS — E119 Type 2 diabetes mellitus without complications: Secondary | ICD-10-CM | POA: Diagnosis not present

## 2017-11-01 DIAGNOSIS — I509 Heart failure, unspecified: Secondary | ICD-10-CM | POA: Diagnosis not present

## 2017-11-01 DIAGNOSIS — E785 Hyperlipidemia, unspecified: Secondary | ICD-10-CM | POA: Insufficient documentation

## 2017-11-01 DIAGNOSIS — R7989 Other specified abnormal findings of blood chemistry: Secondary | ICD-10-CM

## 2017-11-01 DIAGNOSIS — I110 Hypertensive heart disease with heart failure: Secondary | ICD-10-CM | POA: Insufficient documentation

## 2017-11-01 DIAGNOSIS — I739 Peripheral vascular disease, unspecified: Secondary | ICD-10-CM | POA: Diagnosis not present

## 2017-11-01 DIAGNOSIS — R748 Abnormal levels of other serum enzymes: Secondary | ICD-10-CM | POA: Diagnosis present

## 2017-11-01 DIAGNOSIS — R778 Other specified abnormalities of plasma proteins: Secondary | ICD-10-CM

## 2017-11-01 LAB — NM MYOCAR MULTI W/SPECT W/WALL MOTION / EF
CSEPPHR: 93 {beats}/min
LV dias vol: 91 mL (ref 62–150)
LV sys vol: 37 mL
Percent HR: 62 %
Rest HR: 85 {beats}/min
SDS: 2
SRS: 1
SSS: 3
TID: 0.96

## 2017-11-01 MED ORDER — TECHNETIUM TC 99M TETROFOSMIN IV KIT
13.0000 | PACK | Freq: Once | INTRAVENOUS | Status: AC | PRN
  Administered 2017-11-01: 14.27 via INTRAVENOUS

## 2017-11-01 MED ORDER — REGADENOSON 0.4 MG/5ML IV SOLN
0.4000 mg | Freq: Once | INTRAVENOUS | Status: AC
  Administered 2017-11-01: 0.4 mg via INTRAVENOUS

## 2017-11-01 MED ORDER — TECHNETIUM TC 99M TETROFOSMIN IV KIT
30.3250 | PACK | Freq: Once | INTRAVENOUS | Status: AC | PRN
  Administered 2017-11-01: 30.325 via INTRAVENOUS

## 2017-11-10 ENCOUNTER — Telehealth: Payer: Self-pay | Admitting: Physician Assistant

## 2017-11-10 NOTE — Telephone Encounter (Signed)
I spoke with Miguel Harvey at [facility] to follow up if the patient had had his BMP/ BP/ HR's rechecked. Per Miguel Harvey, she has tried to fax this last week- no results received on our end. She will refax to [place] today- she confirmed our fax #.

## 2017-11-10 NOTE — Telephone Encounter (Signed)
Notes recorded by Jefferey Pica, RN on 11/10/2017 at 3:29 PM EDT See 11/10/17 phone note. ------  Notes recorded by Stann Mainland, RN on 11/01/2017 at 12:45 PM EDT S/w Marena Chancy at the nursing home. She states patient did not have any morning medications this morning prior to his myoview. She verbalized understanding of which medications to continue. Patient is due to have the lab work drawn tonight. Alycia Rossetti said it was ok to get BP/HR check from the nursing home. She was provided with fax number and will fax [place] the lab results and BP/HR tomorrow. ------  Notes recorded by Sondra Barges, PA-C on 11/01/2017 at 11:07 AM EDT Stress test showed no evidence of ischemia or infarcts. EF moderately decreased at 30-44% (consistent with echo from 07/2017 with an EF of 20-25%). Felt to be nonischemic cardiomyopathy.  1) He was noted to have hyperkalemia while on both spironolactone and lisinopril that improved with holding of both Medications. He has since been restarted on lisinopril 10 mg daily and is due for a follow up bmet this week. Please ensure this bmet is completed.  2) We need to optimize his heart failure regimen, though have been somewhat limited given his soft BP and recent hyperkalemia.  3) Continue Coreg 6.25 mg bid, Lasix 40 mg daily, and lisinopril 10 mg daily.  4) When he comes for bmet, please check BP/HR as well so we can optimize his medications given his cardiomyopathy.  5) Will not transition from ACEi to [facility] at this time given relative hypotension and hyperkalemia. 6) Not on spironolactone at this time given hyperkalemia.

## 2017-11-12 NOTE — Telephone Encounter (Signed)
Called Miguel Harvey at [facility] to let her know we still had not received the results. She said their fax machine was not working well. She will fax from a different machine right now. I thanked her. Awaiting fax.

## 2017-11-16 NOTE — Telephone Encounter (Signed)
Received labs and blood pressure readings.  Labs scanned and routed to [facility] for review. Blood pressure readings did not include heart rates.  [facility] and got some heart rates. On 11/08/17 BP 119/66, HR 82 11/10/17 BP 112/66, HR 80 11/16/17 BP 99/58, HR 80.  Blood pressure sheet placed on Miguel Harvey's desk for review.

## 2017-11-16 NOTE — Telephone Encounter (Signed)
Nothing further needed at this time. 

## 2017-11-16 NOTE — Telephone Encounter (Signed)
Labs show stable renal function and potassium on lisinopril 10 mg daily and off spironolactone. BP is relatively soft with most systolic BPs running in the low 100s mmHg. We will not make any medication changes at this time. Continue Coreg 6.25 mg bid, Lasix 40 mg daily, ASA 81 mg daily, Lipitor 40 mg daily, lisinopril 10 mg daily.

## 2017-12-21 ENCOUNTER — Ambulatory Visit: Payer: Medicare Other | Admitting: Physician Assistant

## 2018-01-24 NOTE — Progress Notes (Signed)
Cardiology Office Note Date:  01/25/2018  Patient ID:  Miguel, Harvey [birth year], MRN [id number] PCP:  Patient, No Pcp Per  Cardiologist:  Dr. Mariah Milling, MD    Chief Complaint: Follow up  History of Present Illness: Miguel Harvey is a 72 y.o. male with history of nonobstructive CAD by LHC in 05/2011 with 10-20% ostial LAD stenosis at that time, HFrEF due to NICM, pulmonary hypertension, NSVT, recent PEA arrest, PAD s/p right BKA s/p recent balloon angioplasty to the left peroneal artery, left posterior tibial artery, tibioperoneal trunk, and stenting to the distal left SFA in 07/2017, osteomyelitis of the left foot, DM2, HTN, HLD, anemia, tobacco abuse, and obesity who presents for follow up of his HFrEF.   Echo from 05/08/2011 showed an EF of 50-55%, mild LVH, normal wall motion, Gr2DD, mildly dilated LA. Repeat echo on 05/12/2011 showed an EF of 40-45%, mild concentric LVH, global HK, no evidence of LV thrombus. Cath in 05/2011 showed minimal CAD with 10-20% ostial LAD stenosis. Patient was admitted to the hospital in 06/2017 with sepsis from a diabetic foot infection. Echo on 06/30/2017 showed an EF of 40%, diffuse HK, Gr2DD, mild MR, mildly dilated LA, RV cavity size normal with normal RVSF, PASP 63 mmHg. Repeat echo on 07/03/2017 showed an EF of 25-30%, diffuse HK, mild LVH, mild MR, mild PASP. No ischemic evaluation was performed at that time. Patient was admitted to [facility] in 07/2017 with left foot osteomyelitis. While he was undergoing anesthesia induction for metatarsal amputation he suffered a PEA arrest, felt to be in the setting decompensated heart failure. Echo on 08/06/2017 showed an EF of 20-25%, diffuse HK, moderate MR, mildly dilated LA, mildly dilated RV with mildly reduced RVSF, mildly dilated RA, PASP 68 mmHg. He was IV diuresed with a discharge weight of 185 pounds. Outpatient ischemic evaluation was advised to be considered. He was not felt to be a good candidate for ICD given his comorbid  conditions and overall poor prognosis. He was seen in hospital follow up on 10/14/2017 and was doing "fine." Vitals were stable. Labs checked at that time showed a TSH elevated at 4.840, K+ 5.7, SCr 1.07, WBC 7.3, HGB 10.3, magnesium 1.6. He was advised to hold lisinopril and spirolactone. He was started on magnesium oxide. He was advised to follow up with his PCP regarding his elevated TSH. Recheck potassium on 7/19 showed a level of 5.5. He was advised to continue to hold lisinopril and spironolactone. Recheck potassium on 7/25 improved to 4.6. He was restarted on lisinopril 10 mg daily without spironolactone. Recheck bmet on 8/6 showed a stable SCr and potassium at 0.94 and 4.2 respectively. He was continued on lisinopril 10 mg daily. Lexiscan Myoview on 11/01/17 was negative for evidence of ischemia or infarct, EF 30-44%. This was an intermediate risk study based on his low EF which was felt to be NICM.   He comes in doing well today. He is accompanied by his daughter today. He continues to state he is doing "fine." His daughter continues to note each time she asks him how he is doing he replies "fine." He denies any chest pain, SOB, palpitations, dizziness, presyncope or syncope. He has stable 2-pillow orthopnea. No early satiety. He is not adding salt to his food. He is drinking less than 2 L of fluids daily. He denies any swelling of the left lower extremity or right stump. He does not have any concerns at this time.   Past Medical History:  Diagnosis Date  . Coronary artery disease, non-occlusive    a. LHC 05/2011: 10-20% ostial LAD stenosis  . Diabetes mellitus   . HLD (hyperlipidemia)   . Hypertension   . NICM (nonischemic cardiomyopathy) (HCC)    a. TTE 2/213: EF 50-55%, mild LVH, normal wall motion, Gr2DD, mildly dilated LA; b. TTE 2/13: EF 40-45%, mild concentric LVH, global HK, no evidence of LV thrombus; c. TTE 4/19: EF 40%, diffuse HK, Gr2DD, mild MR, mildly dilated LA, RV cavity size normal  w/ normal RVSF, PASP 63; d. TTE 4/19: EF 25-30%, diffuse HK, mild LVH, mild MR, mild PASP; e. TTE 5/19: EF 20-25%, diffuse HK, moderate MR,  . NSVT (nonsustained ventricular tachycardia) (HCC)   . PAD (peripheral artery disease) (HCC)    a. s/p right BKA s/p  recent balloon angioplasty fo the left peroneal artery, left posterior tibial artery, tibioperoneal trunk, and stenting to the distal left SFA in 07/2017  . PEA (Pulseless electrical activity) [facility])     Past Surgical History:  Procedure Laterality Date  . AMPUTATION Right 07/02/2017   Procedure: RIGHT BELOW KNEE AMPUTATION;  Surgeon: Nadara Mustard, MD;  Location: [facility] OR;  Service: Orthopedics;  Laterality: Right;  . cyst removal from  right leg about 6 years ago    . IRRIGATION AND DEBRIDEMENT FOOT Left 08/10/2017   Procedure: IRRIGATION AND DEBRIDEMENT FOOT;  Surgeon: Gwyneth Revels, DPM;  Location: [facility] ORS;  Service: Podiatry;  Laterality: Left;  . LEFT HEART CATHETERIZATION WITH CORONARY ANGIOGRAM N/A 05/11/2011   Procedure: LEFT HEART CATHETERIZATION WITH CORONARY ANGIOGRAM;  Surgeon: Lennette Bihari, MD;  Location: [facility] CATH LAB;  Service: Cardiovascular;  Laterality: N/A;  . LOWER EXTREMITY ANGIOGRAPHY Left 08/13/2017   Procedure: Lower Extremity Angiography and PICC line placement;  Surgeon: Annice Needy, MD;  Location: [facility] INVASIVE CV LAB;  Service: Cardiovascular;  Laterality: Left;    Current Meds  Medication Sig  . albuterol (ACCUNEB) 0.63 MG/3ML nebulizer solution Take 1 ampule by nebulization every 3 (three) hours as needed for wheezing.  Marland Kitchen aspirin EC 81 MG EC tablet Take 1 tablet (81 mg total) by mouth daily.  Marland Kitchen atorvastatin (LIPITOR) 40 MG tablet Take 40 mg by mouth every evening.  . carvedilol (COREG) 6.25 MG tablet Take 6.25 mg by mouth 2 (two) times daily with a meal.  . clopidogrel (PLAVIX) 75 MG tablet Take 1 tablet (75 mg total) by mouth daily.  . furosemide (LASIX) 40 MG tablet Take 1 tablet (40 mg total) by mouth  daily.  Marland Kitchen guaiFENesin-dextromethorphan (ROBITUSSIN DM) 100-10 MG/5ML syrup Take 10 mLs by mouth every 4 (four) hours as needed for cough.  . linagliptin (TRADJENTA) 5 MG TABS tablet Take 5 mg by mouth daily.  Marland Kitchen lisinopril (PRINIVIL,ZESTRIL) 10 MG tablet Take 1 tablet (10 mg total) by mouth daily.  . Magnesium 400 MG TABS Take by mouth daily.  . metFORMIN (GLUCOPHAGE) 500 MG tablet Take 1 tablet (500 mg total) by mouth 2 (two) times daily with a meal.  . Multiple Vitamin (MULTIVITAMIN WITH MINERALS) TABS tablet Take 1 tablet by mouth daily.  Marland Kitchen oxyCODONE-acetaminophen (PERCOCET/ROXICET) 5-325 MG tablet Take 1 tablet by mouth every 8 (eight) hours as needed for moderate pain or severe pain.  . potassium chloride SA (K-DUR,KLOR-CON) 20 MEQ tablet Take 20 mEq by mouth daily.   . sertraline (ZOLOFT) 50 MG tablet Take 50 mg by mouth daily.  Marland Kitchen spironolactone (ALDACTONE) 25 MG tablet Take 25 mg by mouth daily.   Marland Kitchen  tamsulosin (FLOMAX) 0.4 MG CAPS capsule Take 1 capsule (0.4 mg total) by mouth daily.    Allergies:   Patient has no known allergies.   Social History:  The patient  reports that he has been smoking cigarettes. He has a 80.00 pack-year smoking history. He quit smokeless tobacco use about 6 years ago. He reports that he drinks about 6.0 standard drinks of alcohol per week.   Family History:  The patient's family history includes CAD in his other; Diabetes in his other.  ROS:   Review of Systems  Constitutional: Positive for malaise/fatigue. Negative for chills, diaphoresis, fever and weight loss.  HENT: Negative for congestion.   Eyes: Negative for discharge and redness.  Respiratory: Negative for cough, hemoptysis, sputum production, shortness of breath and wheezing.   Cardiovascular: Negative for chest pain, palpitations, orthopnea, claudication, leg swelling and PND.  Gastrointestinal: Negative for abdominal pain, blood in stool, heartburn, melena, nausea and vomiting.  Genitourinary:  Negative for hematuria.  Musculoskeletal: Negative for falls and myalgias.  Skin: Negative for rash.  Neurological: Positive for weakness. Negative for dizziness, tingling, tremors, sensory change, speech change, focal weakness and loss of consciousness.  Endo/Heme/Allergies: Does not bruise/bleed easily.  Psychiatric/Behavioral: Negative for substance abuse. The patient is not nervous/anxious.   All other systems reviewed and are negative.    PHYSICAL EXAM:  VS:  BP (!) 110/58 (BP Location: Left Arm, Patient Position: Sitting, Cuff Size: Normal)   Pulse 86   Ht 5\' 11"  (1.803 m)   BMI 25.80 kg/m  BMI: Body mass index is 25.8 kg/m.  Physical Exam  Constitutional: He is oriented to person, place, and time. He appears well-developed and well-nourished.  HENT:  Head: Normocephalic and atraumatic.  Eyes: Right eye exhibits no discharge. Left eye exhibits no discharge.  Neck: Normal range of motion. No JVD present.  Cardiovascular: Normal rate, regular rhythm, S1 normal and S2 normal. Exam reveals no distant heart sounds, no friction rub, no midsystolic click and no opening snap.  Murmur heard. High-pitched blowing holosystolic murmur is present with a grade of 1/6 at the apex. Pulses:      Posterior tibial pulses are 1+ on the left side.  Pulmonary/Chest: Effort normal and breath sounds normal. No respiratory distress. He has no decreased breath sounds. He has no wheezes. He has no rales. He exhibits no tenderness.  Abdominal: Soft. He exhibits no distension. There is no tenderness.  Musculoskeletal: He exhibits no edema.  S/p right BKA  Neurological: He is alert and oriented to person, place, and time.  Skin: Skin is warm and dry. No cyanosis. Nails show no clubbing.  Psychiatric: He has a normal mood and affect. His speech is normal and behavior is normal. Judgment and thought content normal.     EKG:  Was ordered and interpreted by me today. Shows NSR, 86 bpm, occasional PVCs,  nonspecific st/t changes  Recent Labs: 07/30/2017: B Natriuretic Peptide 1,627.0 08/05/2017: ALT 30 10/14/2017: BUN 29; Creatinine, Ser 1.07; Hemoglobin 10.3; Magnesium 1.6; Platelets 212; Potassium 5.7; Sodium 140; TSH 4.840  08/12/2017: Triglycerides 69   CrCl cannot be calculated (Patient's most recent lab result is older than the maximum 21 days allowed.).   Wt Readings from Last 3 Encounters:  08/14/17 185 lb (83.9 kg)  07/10/17 221 lb 12.5 oz (100.6 kg)  12/23/16 225 lb (102.1 kg)     Other studies reviewed: Additional studies/records reviewed today include: summarized above  ASSESSMENT AND PLAN:  1. HFrEF secondary NICM: He  does not appear grossly volume up at this time, however we are unable to assess his weight due to inability to stand secondary to right BKA. Recent Myoview was without evidence of ischemia. He was previously tried on spironolactone along with lisinopril in an effort to optimize his heart failure therapy, though he developed hyperkalemia with this leading to the stopping of both medications with improvement in his potassium. He has since been restarted on lisinopril alone and has been tolerating this with a stable potassium. Continue Coreg 6.25 mg bid, lisinopril 10 mg daily, and Lasix 40 mg daily. His relative hypotension precludes transition to [facility]. Will not retry spironolactone at this time given issues with hyperkalemia in the past. Has not been felt to be a candidate for ICD given comorbid conditions.   2. NSVT/PVCs: Asymptomatic. Continue carvedilol 6.25 mg twice daily. Relative hypotension precludes escalation of beta-blocker therapy at this time. No indication for antiarrhythmic therapy at this time.  Patient has been felt to not be a good candidate for ICD therapy given his severe comorbid conditions.  3. Mitral regurgitation: Continue to monitor with periodic echoes. Given his comorbid conditions, it remains uncertain if he would be a candidate for valve  replacement/repair should his regurgitation progress.  4. History of hyperkalemia: Resolved with holding of spironolactone in 09/2017. It was noted he still had spironolactone as an active medication on his medication list from his living facility. I made a note on his paperwork that spironolactone was stopped in 09/2017. Check a bmet today.   5. HTN: BP is well controlled today. Continue Coreg 6.25 mg bid, Lasix 40 mg daily, as well as lisinopril 10 mg daily.   6. PAD: Followed by vascular.    Disposition: F/u with Dr. Mariah Milling in 6 months.   Current medicines are reviewed at length with the patient today.  The patient did not have any concerns regarding medicines.  Signed, Eula Listen, PA-C 01/25/2018 1:51 PM     [facility] [address] [place], [place] [address] [phone]

## 2018-01-25 ENCOUNTER — Ambulatory Visit (INDEPENDENT_AMBULATORY_CARE_PROVIDER_SITE_OTHER): Payer: Medicare Other | Admitting: Physician Assistant

## 2018-01-25 ENCOUNTER — Encounter: Payer: Self-pay | Admitting: Physician Assistant

## 2018-01-25 VITALS — BP 110/58 | HR 86 | Ht 71.0 in

## 2018-01-25 DIAGNOSIS — E875 Hyperkalemia: Secondary | ICD-10-CM

## 2018-01-25 DIAGNOSIS — I10 Essential (primary) hypertension: Secondary | ICD-10-CM | POA: Diagnosis not present

## 2018-01-25 DIAGNOSIS — I4729 Other ventricular tachycardia: Secondary | ICD-10-CM

## 2018-01-25 DIAGNOSIS — I472 Ventricular tachycardia: Secondary | ICD-10-CM | POA: Diagnosis not present

## 2018-01-25 DIAGNOSIS — I428 Other cardiomyopathies: Secondary | ICD-10-CM

## 2018-01-25 DIAGNOSIS — I340 Nonrheumatic mitral (valve) insufficiency: Secondary | ICD-10-CM

## 2018-01-25 DIAGNOSIS — I5022 Chronic systolic (congestive) heart failure: Secondary | ICD-10-CM

## 2018-01-25 DIAGNOSIS — I493 Ventricular premature depolarization: Secondary | ICD-10-CM | POA: Diagnosis not present

## 2018-01-25 DIAGNOSIS — Z79899 Other long term (current) drug therapy: Secondary | ICD-10-CM

## 2018-01-25 NOTE — Patient Instructions (Signed)
Medication Instructions:  Your physician recommends that you continue on your current medications as directed. Please refer to the Current Medication list given to you today.  If you need a refill on your cardiac medications before your next appointment, please call your pharmacy.   Lab work: Your physician recommends that you return for lab work in: TODAY - BMET, Mg.  If you have labs (blood work) drawn today and your tests are completely normal, you will receive your results only by: Marland Kitchen MyChart Message (if you have MyChart) OR . A paper copy in the mail If you have any lab test that is abnormal or we need to change your treatment, we will call you to review the results.  Testing/Procedures: none  Follow-Up: At [facility], you and your health needs are our priority.  As part of our continuing mission to provide you with exceptional heart care, we have created designated Provider Care Teams.  These Care Teams include your primary Cardiologist (physician) and Advanced Practice Providers (APPs -  Physician Assistants and Nurse Practitioners) who all work together to provide you with the care you need, when you need it. You will need a follow up appointment in 6 months.  Please call our office 2 months in advance to schedule this appointment.  You may see Julien Nordmann, MD or one of the following Advanced Practice Providers on your designated Care Team:   Nicolasa Ducking, NP Eula Listen, PA-C . Marisue Ivan, PA-C

## 2018-01-26 LAB — BASIC METABOLIC PANEL
BUN/Creatinine Ratio: 16 (ref 10–24)
BUN: 20 mg/dL (ref 8–27)
CO2: 19 mmol/L — AB (ref 20–29)
Calcium: 9.3 mg/dL (ref 8.6–10.2)
Chloride: 104 mmol/L (ref 96–106)
Creatinine, Ser: 1.24 mg/dL (ref 0.76–1.27)
GFR calc Af Amer: 67 mL/min/{1.73_m2} (ref >59)
GFR, EST NON AFRICAN AMERICAN: 58 mL/min/{1.73_m2} — AB (ref >59)
GLUCOSE: 142 mg/dL — AB (ref 65–99)
POTASSIUM: 4.9 mmol/L (ref 3.5–5.2)
SODIUM: 141 mmol/L (ref 134–144)

## 2018-01-26 LAB — MAGNESIUM: MAGNESIUM: 1.7 mg/dL (ref 1.6–2.3)

## 2018-03-25 ENCOUNTER — Ambulatory Visit (INDEPENDENT_AMBULATORY_CARE_PROVIDER_SITE_OTHER): Payer: Medicare Other

## 2018-03-25 ENCOUNTER — Encounter (INDEPENDENT_AMBULATORY_CARE_PROVIDER_SITE_OTHER): Payer: Self-pay | Admitting: Vascular Surgery

## 2018-03-25 ENCOUNTER — Other Ambulatory Visit: Payer: Self-pay

## 2018-03-25 ENCOUNTER — Ambulatory Visit (INDEPENDENT_AMBULATORY_CARE_PROVIDER_SITE_OTHER): Payer: Medicare Other | Admitting: Vascular Surgery

## 2018-03-25 VITALS — BP 105/61 | HR 73

## 2018-03-25 DIAGNOSIS — I10 Essential (primary) hypertension: Secondary | ICD-10-CM | POA: Diagnosis not present

## 2018-03-25 DIAGNOSIS — E11621 Type 2 diabetes mellitus with foot ulcer: Secondary | ICD-10-CM | POA: Diagnosis not present

## 2018-03-25 DIAGNOSIS — E785 Hyperlipidemia, unspecified: Secondary | ICD-10-CM

## 2018-03-25 DIAGNOSIS — I70269 Atherosclerosis of native arteries of extremities with gangrene, unspecified extremity: Secondary | ICD-10-CM

## 2018-03-25 DIAGNOSIS — F17210 Nicotine dependence, cigarettes, uncomplicated: Secondary | ICD-10-CM

## 2018-03-25 DIAGNOSIS — I739 Peripheral vascular disease, unspecified: Secondary | ICD-10-CM | POA: Insufficient documentation

## 2018-03-25 DIAGNOSIS — L97509 Non-pressure chronic ulcer of other part of unspecified foot with unspecified severity: Secondary | ICD-10-CM

## 2018-03-25 NOTE — Assessment & Plan Note (Signed)
lipid control important in reducing the progression of atherosclerotic disease. Continue statin therapy  

## 2018-03-25 NOTE — Assessment & Plan Note (Signed)
blood glucose control important in reducing the progression of atherosclerotic disease. Also, involved in wound healing. On appropriate medications.  

## 2018-03-25 NOTE — Progress Notes (Signed)
MRN : [id number]  Miguel Harvey is a 72 y.o. (14-Mar-1946) male who presents with chief complaint of  Chief Complaint  Patient presents with  . Follow-up  .  History of Present Illness: Patient returns today in follow up of PAD.  He is doing well with no new issues or problems since his last visit.  His left toe amputations have healed and he has no new ulcers.  His right BKA that was done in [place] is healed with no problems.  His left ABI is 0.95 with good flow on duplex and only mild to moderate stenosis above the SFA stent.  Current Outpatient Medications  Medication Sig Dispense Refill  . albuterol (ACCUNEB) 0.63 MG/3ML nebulizer solution Take 1 ampule by nebulization every 3 (three) hours as needed for wheezing.    Marland Kitchen aspirin EC 81 MG EC tablet Take 1 tablet (81 mg total) by mouth daily. 30 tablet 0  . atorvastatin (LIPITOR) 40 MG tablet Take 40 mg by mouth every evening.    . carvedilol (COREG) 6.25 MG tablet Take 6.25 mg by mouth 2 (two) times daily with a meal.    . clopidogrel (PLAVIX) 75 MG tablet Take 1 tablet (75 mg total) by mouth daily. 30 tablet 0  . furosemide (LASIX) 40 MG tablet Take 1 tablet (40 mg total) by mouth daily. 30 tablet 0  . guaiFENesin-dextromethorphan (ROBITUSSIN DM) 100-10 MG/5ML syrup Take 10 mLs by mouth every 4 (four) hours as needed for cough.    . linagliptin (TRADJENTA) 5 MG TABS tablet Take 5 mg by mouth daily.    Marland Kitchen lisinopril (PRINIVIL,ZESTRIL) 10 MG tablet Take 1 tablet (10 mg total) by mouth daily.    . Magnesium 400 MG TABS Take by mouth daily.    . metFORMIN (GLUCOPHAGE) 500 MG tablet Take 1 tablet (500 mg total) by mouth 2 (two) times daily with a meal. 30 tablet 0  . Multiple Vitamin (MULTIVITAMIN WITH MINERALS) TABS tablet Take 1 tablet by mouth daily.    Marland Kitchen oxyCODONE-acetaminophen (PERCOCET/ROXICET) 5-325 MG tablet Take 1 tablet by mouth every 8 (eight) hours as needed for moderate pain or severe pain. 10 tablet 0  . potassium chloride  SA (K-DUR,KLOR-CON) 20 MEQ tablet Take 20 mEq by mouth daily.     . sertraline (ZOLOFT) 50 MG tablet Take 50 mg by mouth daily.    . tamsulosin (FLOMAX) 0.4 MG CAPS capsule Take 1 capsule (0.4 mg total) by mouth daily. 30 capsule 0   No current facility-administered medications for this visit.     Past Medical History:  Diagnosis Date  . Coronary artery disease, non-occlusive    a. LHC 05/2011: 10-20% ostial LAD stenosis  . Diabetes mellitus   . HLD (hyperlipidemia)   . Hypertension   . NICM (nonischemic cardiomyopathy) (HCC)    a. TTE 2/213: EF 50-55%, mild LVH, normal wall motion, Gr2DD, mildly dilated LA; b. TTE 2/13: EF 40-45%, mild concentric LVH, global HK, no evidence of LV thrombus; c. TTE 4/19: EF 40%, diffuse HK, Gr2DD, mild MR, mildly dilated LA, RV cavity size normal w/ normal RVSF, PASP 63; d. TTE 4/19: EF 25-30%, diffuse HK, mild LVH, mild MR, mild PASP; e. TTE 5/19: EF 20-25%, diffuse HK, moderate MR,  . NSVT (nonsustained ventricular tachycardia) (HCC)   . PAD (peripheral artery disease) (HCC)    a. s/p right BKA s/p  recent balloon angioplasty fo the left peroneal artery, left posterior tibial artery, tibioperoneal trunk, and stenting to  the distal left SFA in 07/2017  . PEA (Pulseless electrical activity) [facility])     Past Surgical History:  Procedure Laterality Date  . AMPUTATION Right 07/02/2017   Procedure: RIGHT BELOW KNEE AMPUTATION;  Surgeon: Nadara Mustard, MD;  Location: [facility] OR;  Service: Orthopedics;  Laterality: Right;  . cyst removal from  right leg about 6 years ago    . IRRIGATION AND DEBRIDEMENT FOOT Left 08/10/2017   Procedure: IRRIGATION AND DEBRIDEMENT FOOT;  Surgeon: Gwyneth Revels, DPM;  Location: [facility] ORS;  Service: Podiatry;  Laterality: Left;  . LEFT HEART CATHETERIZATION WITH CORONARY ANGIOGRAM N/A 05/11/2011   Procedure: LEFT HEART CATHETERIZATION WITH CORONARY ANGIOGRAM;  Surgeon: Lennette Bihari, MD;  Location: [facility] CATH LAB;  Service: Cardiovascular;   Laterality: N/A;  . LOWER EXTREMITY ANGIOGRAPHY Left 08/13/2017   Procedure: Lower Extremity Angiography and PICC line placement;  Surgeon: Annice Needy, MD;  Location: [facility] INVASIVE CV LAB;  Service: Cardiovascular;  Laterality: Left;    Social History Social History   Tobacco Use  . Smoking status: Current Every Day Smoker    Packs/day: 2.00    Years: 40.00    Pack years: 80.00    Types: Cigarettes  . Smokeless tobacco: Former Neurosurgeon    Quit date: 05/07/2011  Substance Use Topics  . Alcohol use: Yes    Alcohol/week: 6.0 standard drinks    Types: 6 Cans of beer per week  . Drug use: Not on file  patient says he has stopped smoking.  Family History Family History  Problem Relation Age of Onset  . CAD Other   . Diabetes Other     Allergies  Allergen Reactions  . Spironolactone     Hyperkalemia      REVIEW OF SYSTEMS (Negative unless checked)  Constitutional: [] Weight loss  [] Fever  [] Chills Cardiac: [] Chest pain   [] Chest pressure   [] Palpitations   [] Shortness of breath when laying flat   [] Shortness of breath at rest   [] Shortness of breath with exertion. Vascular:  [] Pain in legs with walking   [] Pain in legs at rest   [] Pain in legs when laying flat   [] Claudication   [] Pain in feet when walking  [] Pain in feet at rest  [] Pain in feet when laying flat   [] History of DVT   [] Phlebitis   [] Swelling in legs   [] Varicose veins   [] Non-healing ulcers Pulmonary:   [] Uses home oxygen   [] Productive cough   [] Hemoptysis   [] Wheeze  [] COPD   [] Asthma Neurologic:  [] Dizziness  [] Blackouts   [] Seizures   [] History of stroke   [] History of TIA  [] Aphasia   [] Temporary blindness   [] Dysphagia   [] Weakness or numbness in arms   [x] Weakness or numbness in legs Musculoskeletal:  [x] Arthritis   [] Joint swelling   [] Joint pain   [] Low back pain Hematologic:  [] Easy bruising  [] Easy bleeding   [] Hypercoagulable state   [] Anemic   Gastrointestinal:  [] Blood in stool   [] Vomiting blood   [] Gastroesophageal reflux/heartburn   [] Abdominal pain Genitourinary:  [] Chronic kidney disease   [] Difficult urination  [] Frequent urination  [] Burning with urination   [] Hematuria Skin:  [] Rashes   [] Ulcers   [] Wounds Psychological:  [] History of anxiety   []  History of major depression.  Physical Examination  BP 105/61 (BP Location: Right Arm, Patient Position: Sitting)   Pulse 73  Gen:  WD/WN, NAD Head: [place]/AT, No temporalis wasting. Ear/Nose/Throat: Hearing grossly intact, nares w/o erythema or drainage  Eyes: Conjunctiva clear. Sclera non-icteric Neck: Supple.  Trachea midline Pulmonary:  Good air movement, no use of accessory muscles.  Cardiac: RRR, no JVD Vascular:  Vessel Right Left  Radial Palpable Palpable                          PT Not Palpable 1+ Palpable  DP Not Palpable 2+ Palpable    Musculoskeletal: M/S 5/5 throughout. Uses a wheelchair.  Right BKA well-healed.  Left digital amputations 2 through 5 well-healed.  No ulcerations.  No edema. Neurologic: Sensation grossly intact in extremities.  Symmetrical.  Speech is fluent.  Psychiatric: Judgment intact, Mood & affect appropriate for pt's clinical situation. Dermatologic: No rashes or ulcers noted.  No cellulitis or open wounds.       Labs Recent Results (from the past 2160 hour(s))  Magnesium     Status: None   Collection Time: 01/25/18  2:16 PM  Result Value Ref Range   Magnesium 1.7 1.6 - 2.3 mg/dL  Basic metabolic panel     Status: Abnormal   Collection Time: 01/25/18  2:16 PM  Result Value Ref Range   Glucose 142 (H) 65 - 99 mg/dL   BUN 20 8 - 27 mg/dL   Creatinine, Ser 1.611.24 0.76 - 1.27 mg/dL   GFR calc non Af Amer 58 (L) >59 mL/min/1.73   GFR calc Af Amer 67 >59 mL/min/1.73   BUN/Creatinine Ratio 16 10 - 24   Sodium 141 134 - 144 mmol/L   Potassium 4.9 3.5 - 5.2 mmol/L   Chloride 104 96 - 106 mmol/L   CO2 19 (L) 20 - 29 mmol/L   Calcium 9.3 8.6 - 10.2 mg/dL    Radiology No results  found.  Assessment/Plan  Dyslipidemia lipid control important in reducing the progression of atherosclerotic disease. Continue statin therapy   Diabetes mellitus (HCC) blood glucose control important in reducing the progression of atherosclerotic disease. Also, involved in wound healing. On appropriate medications.   HTN (hypertension), malignant blood pressure control important in reducing the progression of atherosclerotic disease. On appropriate oral medications.   PAD (peripheral artery disease) (HCC) His left ABI is 0.95 with good flow on duplex and only mild to moderate stenosis above the SFA stent. He will continue his current medical regimen.  His flow is currently quite good and he has no limb threatening symptoms.  Return to clinic in 6 months in follow-up or sooner if problems develop in the interim.    Festus BarrenJason Dew, MD  03/25/2018 11:42 AM    This note was created with Dragon medical transcription system.  Any errors from dictation are purely unintentional

## 2018-03-25 NOTE — Patient Instructions (Signed)

## 2018-03-25 NOTE — Assessment & Plan Note (Signed)
blood pressure control important in reducing the progression of atherosclerotic disease. On appropriate oral medications.  

## 2018-03-25 NOTE — Assessment & Plan Note (Signed)
His left ABI is 0.95 with good flow on duplex and only mild to moderate stenosis above the SFA stent. He will continue his current medical regimen.  His flow is currently quite good and he has no limb threatening symptoms.  Return to clinic in 6 months in follow-up or sooner if problems develop in the interim.

## 2018-05-23 ENCOUNTER — Other Ambulatory Visit: Payer: Self-pay

## 2018-05-23 NOTE — Patient Outreach (Signed)
[facility]) Care Management  05/23/2018  JONAVON HORSMAN Sep 03, 1945 [id number]   Medication Adherence call to Mr. Stephenson Goings patient is at a home care Facility at this time and they deliver this medication Tradjenta 5 mg every 14 days. Mr. Moquete is showing past due under [employer].   Lillia Abed CPhT Pharmacy Technician [facility] Care Management Direct Dial [phone]  Fax [phone] [email or web address]

## 2018-06-16 IMAGING — CT CT HEAD W/O CM
3 series · 15 of 47 positions shown, 18 images · non-contrast
Comparison: CT scan of July 02, 2017.

CLINICAL DATA: Altered level of consciousness.

EXAM:
CT HEAD WITHOUT CONTRAST
TECHNIQUE: Contiguous axial images were obtained from the base of the skull
through the vertex without intravenous contrast.

[Series 2: head wo · axial · 0.43mm/px · z∈[-142,-17]mm · 9 of 30 slices shown, 12 images]
[im 3/30  brain]
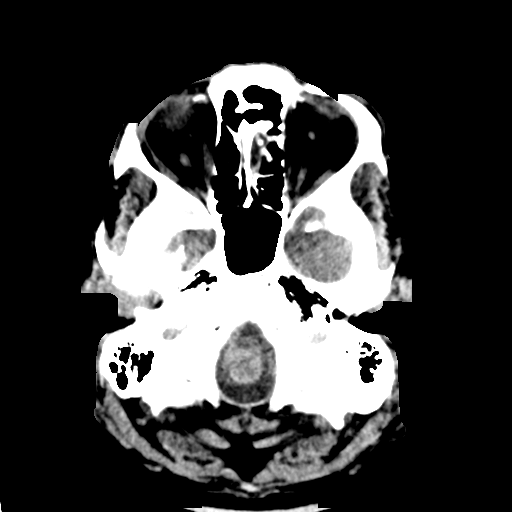
[im 3/30  bone]
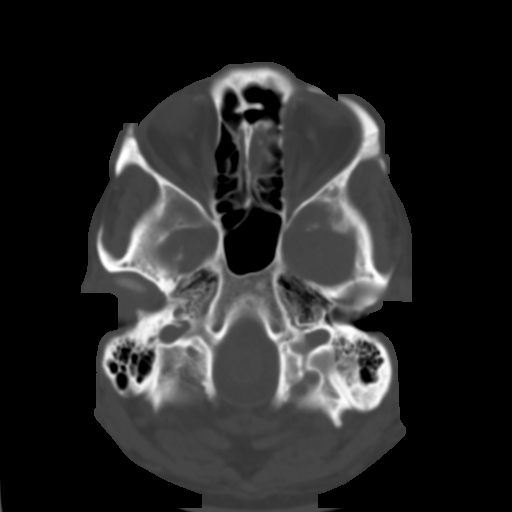
[im 6/30  brain]
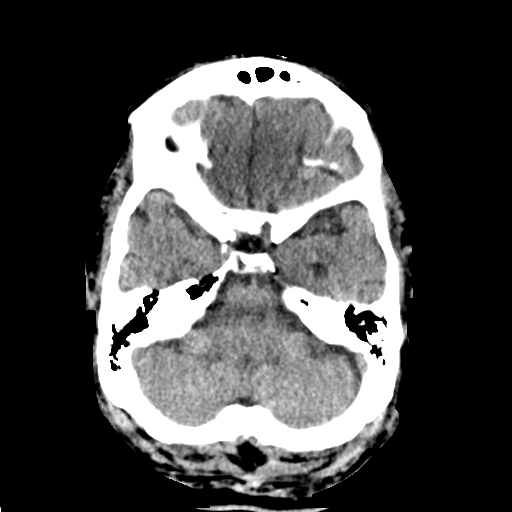
[im 9/30  brain]
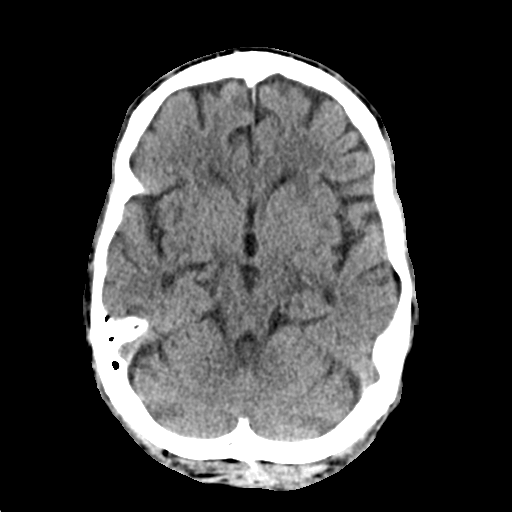
[im 12/30  brain]
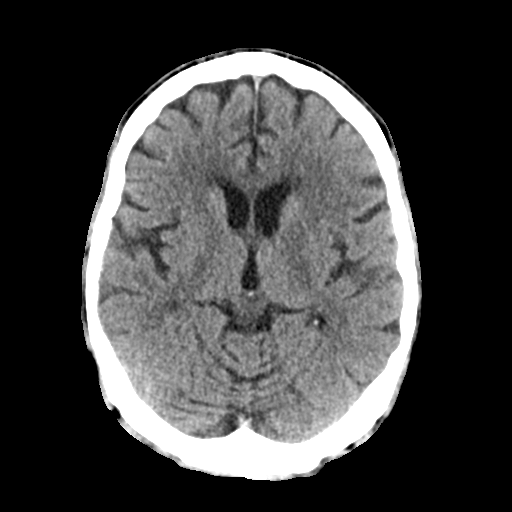
[im 16/30  brain]
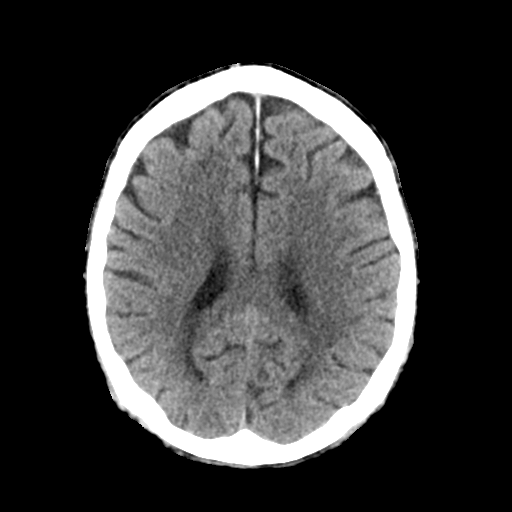
[im 16/30  bone]
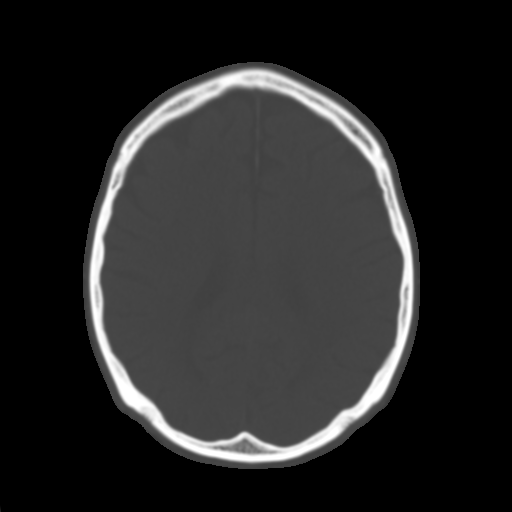
[im 19/30  brain]
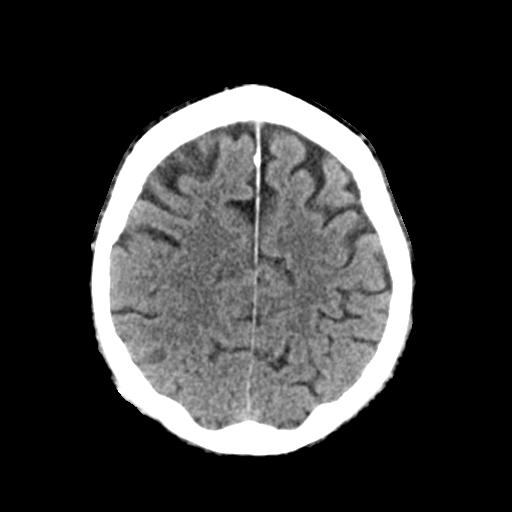
[im 22/30  brain]
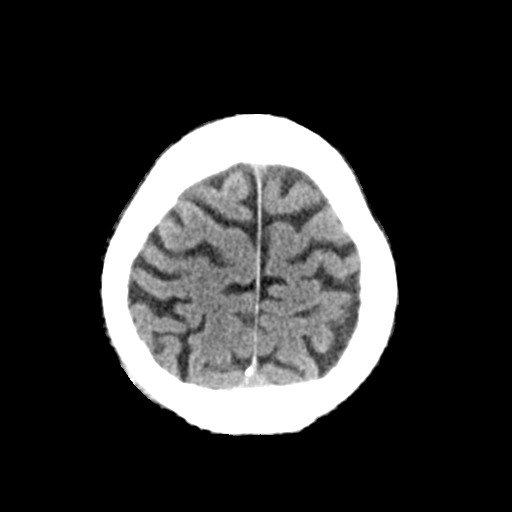
[im 25/30  brain]
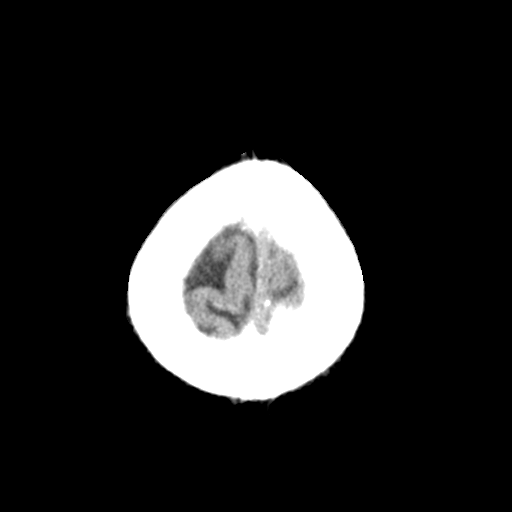
[im 28/30  brain]
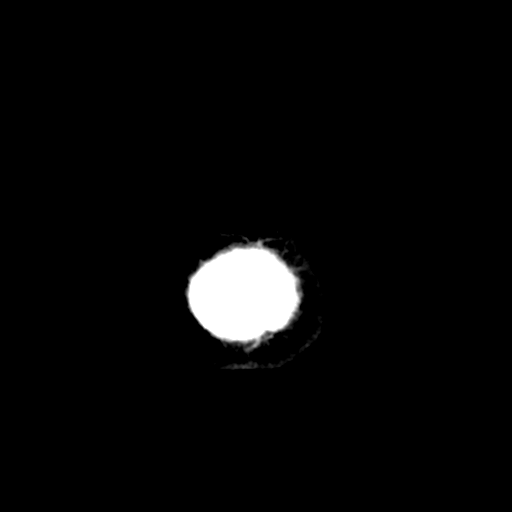
[im 28/30  bone]
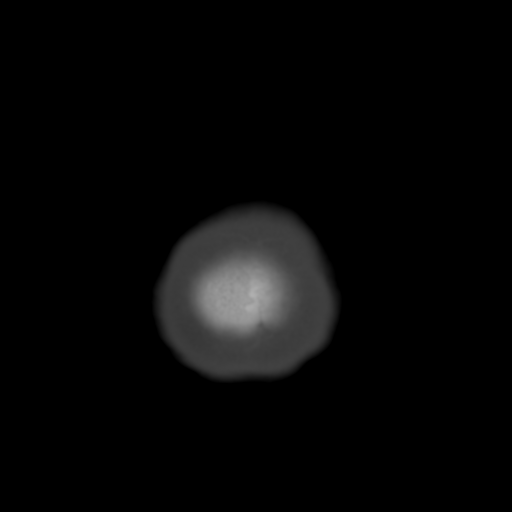

[Series 4: coronal soft tissue · coronal · 0.31mm/px · 3 of 67 slices shown]
[im 23/67  brain]
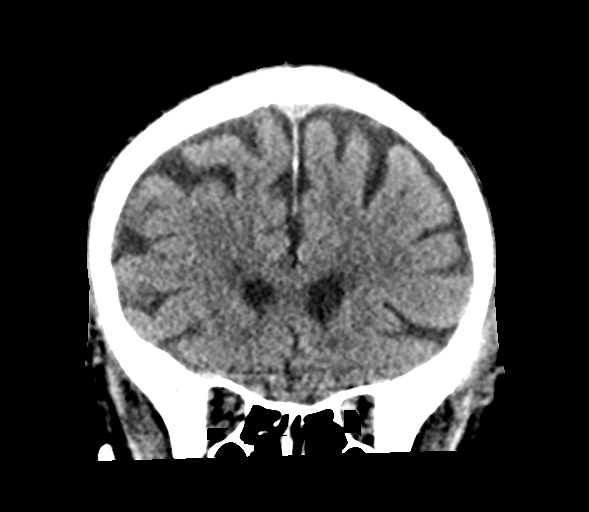
[im 30/67  brain]
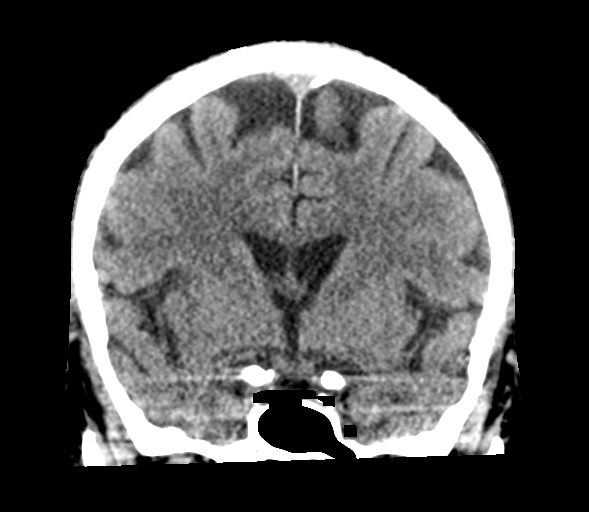
[im 37/67  brain]
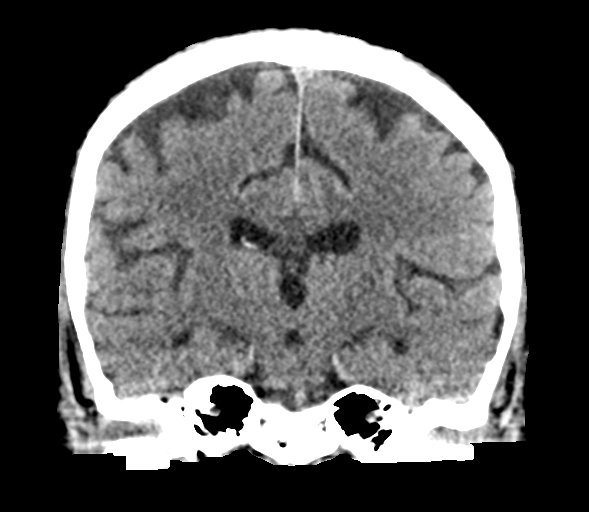

[Series 5: sagittal soft tissue · sagittal · 0.32mm/px · 3 of 54 slices shown]
[im 18/54  brain]
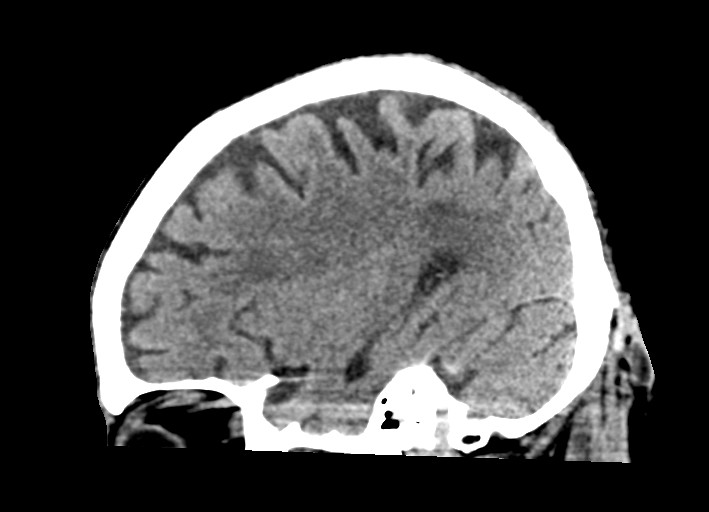
[im 27/54  brain]
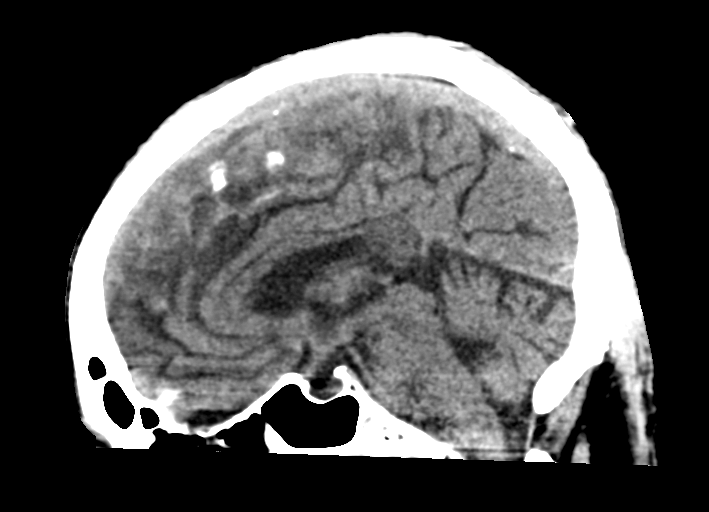
[im 36/54  brain]
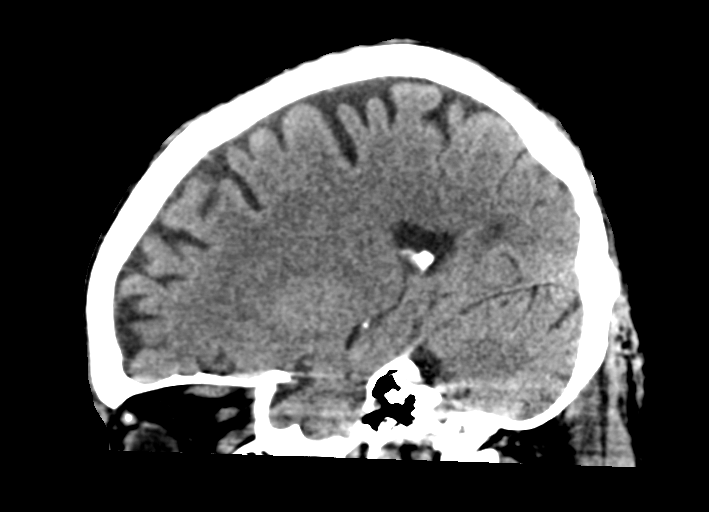

[15 of 47 positions shown; findings below may reference images not displayed]

FINDINGS: Brain: Mild chronic ischemic white matter disease is noted. No mass
effect or midline shift is noted. Ventricular size is within normal
limits. There is no evidence of mass lesion, hemorrhage or acute
infarction.

Vascular: No mass effect or midline shift is noted. Ventricular size
is within normal limits. There is no evidence of mass lesion,
hemorrhage or acute infarction.

Skull: Normal. Negative for fracture or focal lesion.

Sinuses/Orbits: No acute finding.

Other: None.
IMPRESSION: Mild chronic ischemic white matter disease. No acute intracranial
abnormality seen.

## 2018-06-23 IMAGING — DX DG CHEST 1V PORT
1 series · 1 of 1 positions shown · non-contrast
Comparison: Radiograph August 06, 2017.

CLINICAL DATA: Shortness of breath.

EXAM:
PORTABLE CHEST 1 VIEW

[chest ap]
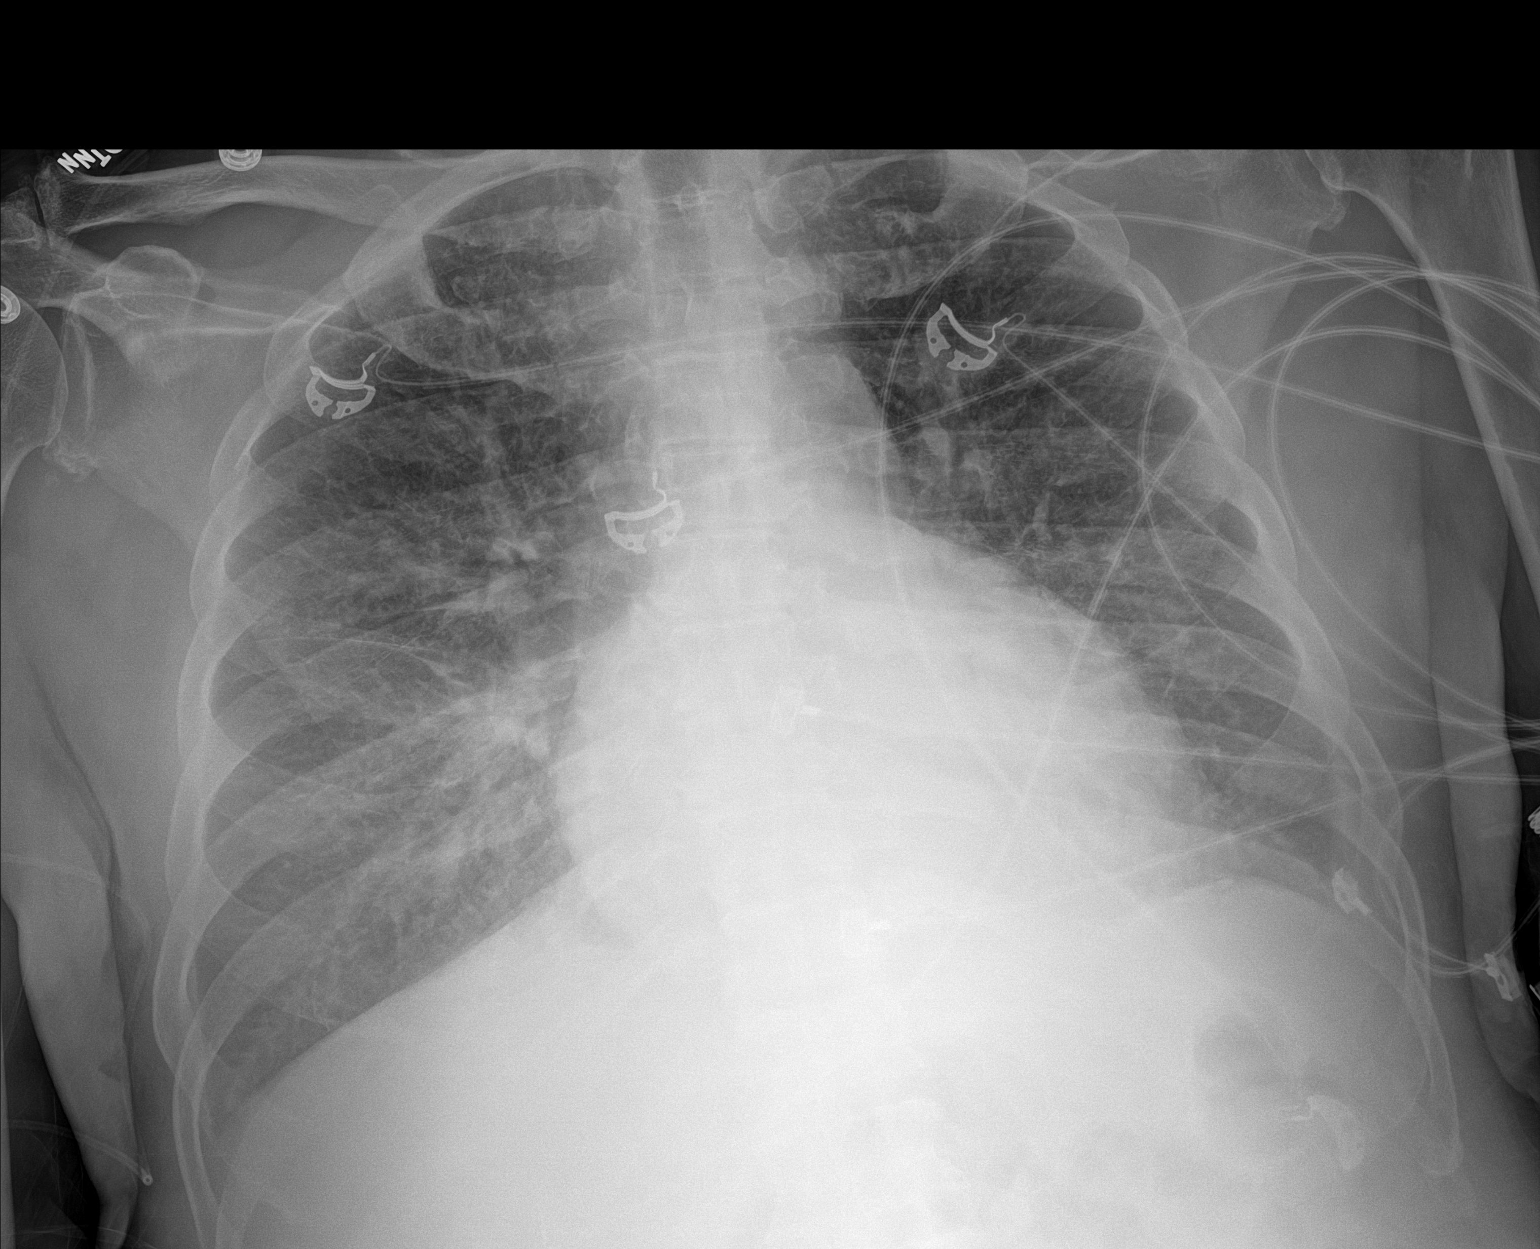

[1 of 1 positions shown; findings below may reference images not displayed]

FINDINGS: Stable cardiomediastinal silhouette. Mild central pulmonary vascular
congestion is noted with probable bilateral perihilar edema. No
pneumothorax or significant pleural effusion is noted. Bony thorax
is unremarkable.
IMPRESSION: Mild central pulmonary vascular congestion is noted with probable
bilateral perihilar edema.

## 2018-09-23 ENCOUNTER — Ambulatory Visit (INDEPENDENT_AMBULATORY_CARE_PROVIDER_SITE_OTHER): Payer: Medicare Other

## 2018-09-23 ENCOUNTER — Encounter (INDEPENDENT_AMBULATORY_CARE_PROVIDER_SITE_OTHER): Payer: Self-pay | Admitting: Nurse Practitioner

## 2018-09-23 ENCOUNTER — Other Ambulatory Visit: Payer: Self-pay

## 2018-09-23 ENCOUNTER — Ambulatory Visit (INDEPENDENT_AMBULATORY_CARE_PROVIDER_SITE_OTHER): Payer: Medicare Other | Admitting: Nurse Practitioner

## 2018-09-23 VITALS — BP 110/65 | HR 84 | Resp 10 | Ht 69.0 in | Wt 230.0 lb

## 2018-09-23 DIAGNOSIS — I739 Peripheral vascular disease, unspecified: Secondary | ICD-10-CM | POA: Diagnosis not present

## 2018-09-23 DIAGNOSIS — Z7902 Long term (current) use of antithrombotics/antiplatelets: Secondary | ICD-10-CM

## 2018-09-23 DIAGNOSIS — F17200 Nicotine dependence, unspecified, uncomplicated: Secondary | ICD-10-CM

## 2018-09-23 DIAGNOSIS — F17210 Nicotine dependence, cigarettes, uncomplicated: Secondary | ICD-10-CM | POA: Diagnosis not present

## 2018-09-23 DIAGNOSIS — Z89511 Acquired absence of right leg below knee: Secondary | ICD-10-CM

## 2018-09-23 DIAGNOSIS — Z7984 Long term (current) use of oral hypoglycemic drugs: Secondary | ICD-10-CM

## 2018-09-23 DIAGNOSIS — E11621 Type 2 diabetes mellitus with foot ulcer: Secondary | ICD-10-CM

## 2018-09-23 DIAGNOSIS — E785 Hyperlipidemia, unspecified: Secondary | ICD-10-CM

## 2018-09-23 DIAGNOSIS — E1151 Type 2 diabetes mellitus with diabetic peripheral angiopathy without gangrene: Secondary | ICD-10-CM | POA: Diagnosis not present

## 2018-09-23 DIAGNOSIS — Z79899 Other long term (current) drug therapy: Secondary | ICD-10-CM

## 2018-09-23 NOTE — Progress Notes (Signed)
SUBJECTIVE:  Patient ID: Miguel Harvey, male    DOB: 02-10-1946, 73 y.o.   MRN: [id number] Chief Complaint  Patient presents with  . Follow-up    HPI  Miguel Harvey is a 73 y.o. male The patient returns to the office for followup and review of the noninvasive studies. There have been no interval changes in lower extremity symptoms. No interval shortening of the patient's claudication distance or development of rest pain symptoms. No new ulcers or wounds have occurred since the last visit.  Patient has previous R BKA.   There have been no significant changes to the patient's overall health care.  The patient denies amaurosis fugax or recent TIA symptoms. There are no recent neurological changes noted. The patient denies history of DVT, PE or superficial thrombophlebitis. The patient denies recent episodes of angina or shortness of breath.   ABI Rt=n/a and Lt=0.88  (previous ABI's Rt=n/a and Lt=0.95) Duplex ultrasound of the lower extremity tibial arteries reveals strong monophasic waveforms.  TBI 0.76.  Strong toe waveforms.  Past Medical History:  Diagnosis Date  . Coronary artery disease, non-occlusive    a. LHC 05/2011: 10-20% ostial LAD stenosis  . Diabetes mellitus   . HLD (hyperlipidemia)   . Hypertension   . NICM (nonischemic cardiomyopathy) (HCC)    a. TTE 2/213: EF 50-55%, mild LVH, normal wall motion, Gr2DD, mildly dilated LA; b. TTE 2/13: EF 40-45%, mild concentric LVH, global HK, no evidence of LV thrombus; c. TTE 4/19: EF 40%, diffuse HK, Gr2DD, mild MR, mildly dilated LA, RV cavity size normal w/ normal RVSF, PASP 63; d. TTE 4/19: EF 25-30%, diffuse HK, mild LVH, mild MR, mild PASP; e. TTE 5/19: EF 20-25%, diffuse HK, moderate MR,  . NSVT (nonsustained ventricular tachycardia) (HCC)   . PAD (peripheral artery disease) (HCC)    a. s/p right BKA s/p  recent balloon angioplasty fo the left peroneal artery, left posterior tibial artery, tibioperoneal trunk, and stenting  to the distal left SFA in 07/2017  . PEA (Pulseless electrical activity) [facility])     Past Surgical History:  Procedure Laterality Date  . AMPUTATION Right 07/02/2017   Procedure: RIGHT BELOW KNEE AMPUTATION;  Surgeon: Nadara Mustard, MD;  Location: [facility] OR;  Service: Orthopedics;  Laterality: Right;  . cyst removal from  right leg about 6 years ago    . IRRIGATION AND DEBRIDEMENT FOOT Left 08/10/2017   Procedure: IRRIGATION AND DEBRIDEMENT FOOT;  Surgeon: Gwyneth Revels, DPM;  Location: [facility] ORS;  Service: Podiatry;  Laterality: Left;  . LEFT HEART CATHETERIZATION WITH CORONARY ANGIOGRAM N/A 05/11/2011   Procedure: LEFT HEART CATHETERIZATION WITH CORONARY ANGIOGRAM;  Surgeon: Lennette Bihari, MD;  Location: [facility] CATH LAB;  Service: Cardiovascular;  Laterality: N/A;  . LOWER EXTREMITY ANGIOGRAPHY Left 08/13/2017   Procedure: Lower Extremity Angiography and PICC line placement;  Surgeon: Annice Needy, MD;  Location: [facility] INVASIVE CV LAB;  Service: Cardiovascular;  Laterality: Left;    Social History   Socioeconomic History  . Marital status: Single    Spouse name: Not on file  . Number of children: Not on file  . Years of education: Not on file  . Highest education level: Not on file  Occupational History  . Not on file  Social Needs  . Financial resource strain: Not on file  . Food insecurity    Worry: Not on file    Inability: Not on file  . Transportation needs    Medical: Not  on file    Non-medical: Not on file  Tobacco Use  . Smoking status: Current Every Day Smoker    Packs/day: 2.00    Years: 40.00    Pack years: 80.00    Types: Cigarettes  . Smokeless tobacco: Former NeurosurgeonUser    Quit date: 05/07/2011  Substance and Sexual Activity  . Alcohol use: Yes    Alcohol/week: 6.0 standard drinks    Types: 6 Cans of beer per week  . Drug use: Not on file  . Sexual activity: Not on file  Lifestyle  . Physical activity    Days per week: 0 days    Minutes per session: 0 min  . Stress: Not  at all  Relationships  . Social Musicianconnections    Talks on phone: Not on file    Gets together: Not on file    Attends religious service: Not on file    Active member of club or organization: Not on file    Attends meetings of clubs or organizations: Not on file    Relationship status: Not on file  . Intimate partner violence    Fear of current or ex partner: Not on file    Emotionally abused: Not on file    Physically abused: Not on file    Forced sexual activity: Not on file  Other Topics Concern  . Not on file  Social History Narrative  . Not on file    Family History  Problem Relation Age of Onset  . CAD Other   . Diabetes Other     Allergies  Allergen Reactions  . Spironolactone     Hyperkalemia      Review of Systems   Review of Systems: Negative Unless Checked Constitutional: [] Weight loss  [] Fever  [] Chills Cardiac: [] Chest pain   []  Atrial Fibrillation  [] Palpitations   [] Shortness of breath when laying flat   [] Shortness of breath with exertion. [] Shortness of breath at rest Vascular:  [] Pain in legs with walking   [] Pain in legs with standing [] Pain in legs when laying flat   [] Claudication    [] Pain in feet when laying flat    [] History of DVT   [] Phlebitis   [] Swelling in legs   [] Varicose veins   [] Non-healing ulcers Pulmonary:   [] Uses home oxygen   [] Productive cough   [] Hemoptysis   [] Wheeze  [] COPD   [] Asthma Neurologic:  [] Dizziness   [] Seizures  [] Blackouts [] History of stroke   [] History of TIA  [] Aphasia   [] Temporary Blindness   [] Weakness or numbness in arm   [] Weakness or numbness in leg Musculoskeletal:   [] Joint swelling   [] Joint pain   [] Low back pain  []  History of Knee Replacement [] Arthritis [] back Surgeries  []  Spinal Stenosis    Hematologic:  [] Easy bruising  [] Easy bleeding   [] Hypercoagulable state   [] Anemic Gastrointestinal:  [] Diarrhea   [] Vomiting  [] Gastroesophageal reflux/heartburn   [] Difficulty swallowing. [] Abdominal pain  Genitourinary:  [] Chronic kidney disease   [] Difficult urination  [] Anuric   [] Blood in urine [] Frequent urination  [] Burning with urination   [] Hematuria Skin:  [] Rashes   [] Ulcers [] Wounds Psychological:  [] History of anxiety   []  History of major depression  []  Memory Difficulties      OBJECTIVE:   Physical Exam  BP 110/65 (BP Location: Left Arm, Patient Position: Sitting, Cuff Size: Large)   Pulse 84   Resp 10   Ht 5\' 9"  (1.753 m)   Wt 230 lb (104.3 kg)  BMI 33.97 kg/m   Gen: WD/WN, NAD Head: [place]/AT, No temporalis wasting.  Ear/Nose/Throat: Hearing grossly intact, nares w/o erythema or drainage Eyes: PER, EOMI, sclera nonicteric.  Neck: Supple, no masses.  No JVD.  Pulmonary:  Good air movement, no use of accessory muscles.  Cardiac: RRR Vascular:  Vessel Right Left  Radial Palpable Palpable  Dorsalis Pedis  Not Palpable  Posterior Tibial  Not Palpable   Gastrointestinal: soft, non-distended. No guarding/no peritoneal signs.  Musculoskeletal: M/S 5/5 throughout.  R BKA  Neurologic: Pain and light touch intact in extremities.  Symmetrical.  Speech is fluent. Motor exam as listed above. Psychiatric: Judgment intact, Mood & affect appropriate for pt's clinical situation. Dermatologic: No Venous rashes. No Ulcers Noted.  No changes consistent with cellulitis. Lymph : No Cervical lymphadenopathy, no lichenification or skin changes of chronic lymphedema.       ASSESSMENT AND PLAN:  1. PAD (peripheral artery disease) (HCC)  Recommend:  The patient has evidence of atherosclerosis of the lower extremities with claudication.  The patient does not voice lifestyle limiting changes at this point in time.  Noninvasive studies do not suggest clinically significant change.  No invasive studies, angiography or surgery at this time The patient should continue walking and begin a more formal exercise program.  The patient should continue antiplatelet therapy and aggressive  treatment of the lipid abnormalities  No changes in the patient's medications at this time  The patient should continue wearing graduated compression socks 10-15 mmHg strength to control the mild edema.   - VAS [place] ABI WITH/WO TBI; Future - VAS [place] LOWER EXTREMITY ARTERIAL DUPLEX; Future  2. Type 2 diabetes mellitus with foot ulcer, without long-term current use of insulin (HCC) Continue hypoglycemic medications as already ordered, these medications have been reviewed and there are no changes at this time.  Hgb A1C to be monitored as already arranged by primary service   3. Dyslipidemia Continue statin as ordered and reviewed, no changes at this time   4. TOBACCO DEPENDENCE Smoking cessation was discussed, 3-10 minutes spent on this topic specifically    Current Outpatient Medications on File Prior to Visit  Medication Sig Dispense Refill  . albuterol (ACCUNEB) 0.63 MG/3ML nebulizer solution Take 1 ampule by nebulization every 3 (three) hours as needed for wheezing.    Marland Kitchen aspirin EC 81 MG EC tablet Take 1 tablet (81 mg total) by mouth daily. 30 tablet 0  . atorvastatin (LIPITOR) 40 MG tablet Take 40 mg by mouth every evening.    . carvedilol (COREG) 6.25 MG tablet Take 6.25 mg by mouth 2 (two) times daily with a meal.    . clopidogrel (PLAVIX) 75 MG tablet Take 1 tablet (75 mg total) by mouth daily. 30 tablet 0  . furosemide (LASIX) 40 MG tablet Take 1 tablet (40 mg total) by mouth daily. 30 tablet 0  . guaiFENesin-dextromethorphan (ROBITUSSIN DM) 100-10 MG/5ML syrup Take 10 mLs by mouth every 4 (four) hours as needed for cough.    . linagliptin (TRADJENTA) 5 MG TABS tablet Take 5 mg by mouth daily.    Marland Kitchen lisinopril (PRINIVIL,ZESTRIL) 10 MG tablet Take 1 tablet (10 mg total) by mouth daily.    . Magnesium 400 MG TABS Take by mouth daily.    . metFORMIN (GLUCOPHAGE) 500 MG tablet Take 1 tablet (500 mg total) by mouth 2 (two) times daily with a meal. 30 tablet 0  . Multiple Vitamin  (MULTIVITAMIN WITH MINERALS) TABS tablet Take 1 tablet by  mouth daily.    Marland Kitchen. oxyCODONE-acetaminophen (PERCOCET/ROXICET) 5-325 MG tablet Take 1 tablet by mouth every 8 (eight) hours as needed for moderate pain or severe pain. 10 tablet 0  . potassium chloride SA (K-DUR,KLOR-CON) 20 MEQ tablet Take 20 mEq by mouth daily.     . sertraline (ZOLOFT) 50 MG tablet Take 50 mg by mouth daily.    . tamsulosin (FLOMAX) 0.4 MG CAPS capsule Take 1 capsule (0.4 mg total) by mouth daily. 30 capsule 0   No current facility-administered medications on file prior to visit.     There are no Patient Instructions on file for this visit. Return in about 6 months (around 03/25/2019) for PAD.   Georgiana SpinnerFallon E Tonatiuh Mallon, NP  This note was completed with Office managerDragon Dictation.  Any errors are purely unintentional.

## 2018-11-03 ENCOUNTER — Encounter: Payer: Self-pay | Admitting: *Deleted

## 2018-11-04 ENCOUNTER — Other Ambulatory Visit: Admission: RE | Admit: 2018-11-04 | Payer: Medicare Other | Source: Ambulatory Visit

## 2018-11-08 ENCOUNTER — Ambulatory Visit: Payer: Self-pay | Admitting: Certified Registered"

## 2018-11-08 ENCOUNTER — Other Ambulatory Visit: Payer: Self-pay

## 2018-11-08 ENCOUNTER — Encounter: Payer: Self-pay | Admitting: Ophthalmology

## 2018-11-08 ENCOUNTER — Ambulatory Visit
Admission: RE | Admit: 2018-11-08 | Discharge: 2018-11-08 | Disposition: A | Discharge status: Home / Self Care | Payer: Medicare Other | Attending: Ophthalmology | Admitting: Ophthalmology

## 2018-11-08 ENCOUNTER — Inpatient Hospital Stay: Admission: RE | Admit: 2018-11-08 | Payer: Medicare Other | Source: Ambulatory Visit

## 2018-11-08 ENCOUNTER — Encounter: Payer: Self-pay | Admitting: Certified Registered"

## 2018-11-08 ENCOUNTER — Encounter
Admission: RE | Disposition: A | Discharge status: Home / Self Care | Payer: Self-pay | Source: Home / Self Care | Attending: Ophthalmology

## 2018-11-08 DIAGNOSIS — Z79899 Other long term (current) drug therapy: Secondary | ICD-10-CM | POA: Diagnosis not present

## 2018-11-08 DIAGNOSIS — I130 Hypertensive heart and chronic kidney disease with heart failure and stage 1 through stage 4 chronic kidney disease, or unspecified chronic kidney disease: Secondary | ICD-10-CM | POA: Insufficient documentation

## 2018-11-08 DIAGNOSIS — Z87891 Personal history of nicotine dependence: Secondary | ICD-10-CM | POA: Diagnosis not present

## 2018-11-08 DIAGNOSIS — E1136 Type 2 diabetes mellitus with diabetic cataract: Secondary | ICD-10-CM | POA: Insufficient documentation

## 2018-11-08 DIAGNOSIS — I252 Old myocardial infarction: Secondary | ICD-10-CM | POA: Diagnosis not present

## 2018-11-08 DIAGNOSIS — I509 Heart failure, unspecified: Secondary | ICD-10-CM | POA: Diagnosis not present

## 2018-11-08 DIAGNOSIS — E1151 Type 2 diabetes mellitus with diabetic peripheral angiopathy without gangrene: Secondary | ICD-10-CM | POA: Insufficient documentation

## 2018-11-08 DIAGNOSIS — Z7982 Long term (current) use of aspirin: Secondary | ICD-10-CM | POA: Insufficient documentation

## 2018-11-08 DIAGNOSIS — Z7902 Long term (current) use of antithrombotics/antiplatelets: Secondary | ICD-10-CM | POA: Diagnosis not present

## 2018-11-08 DIAGNOSIS — Z7984 Long term (current) use of oral hypoglycemic drugs: Secondary | ICD-10-CM | POA: Insufficient documentation

## 2018-11-08 DIAGNOSIS — I2510 Atherosclerotic heart disease of native coronary artery without angina pectoris: Secondary | ICD-10-CM | POA: Insufficient documentation

## 2018-11-08 DIAGNOSIS — E1142 Type 2 diabetes mellitus with diabetic polyneuropathy: Secondary | ICD-10-CM | POA: Diagnosis not present

## 2018-11-08 DIAGNOSIS — E1122 Type 2 diabetes mellitus with diabetic chronic kidney disease: Secondary | ICD-10-CM | POA: Diagnosis not present

## 2018-11-08 DIAGNOSIS — H2512 Age-related nuclear cataract, left eye: Secondary | ICD-10-CM | POA: Diagnosis not present

## 2018-11-08 DIAGNOSIS — N182 Chronic kidney disease, stage 2 (mild): Secondary | ICD-10-CM | POA: Diagnosis not present

## 2018-11-08 DIAGNOSIS — Z20828 Contact with and (suspected) exposure to other viral communicable diseases: Secondary | ICD-10-CM | POA: Insufficient documentation

## 2018-11-08 HISTORY — DX: Depression, unspecified: F32.A

## 2018-11-08 HISTORY — PX: CATARACT EXTRACTION W/PHACO: SHX586

## 2018-11-08 HISTORY — DX: Chronic kidney disease, unspecified: N18.9

## 2018-11-08 HISTORY — DX: Abnormal posture: R29.3

## 2018-11-08 HISTORY — DX: Myoneural disorder, unspecified: G70.9

## 2018-11-08 HISTORY — DX: Muscle weakness (generalized): M62.81

## 2018-11-08 LAB — GLUCOSE, CAPILLARY: Glucose-Capillary: 237 mg/dL — ABNORMAL HIGH (ref 70–99)

## 2018-11-08 LAB — SARS CORONAVIRUS 2 BY RT PCR (HOSPITAL ORDER, PERFORMED IN ~~LOC~~ HOSPITAL LAB): SARS Coronavirus 2: NEGATIVE

## 2018-11-08 SURGERY — PHACOEMULSIFICATION, CATARACT, WITH IOL INSERTION
Anesthesia: Monitor Anesthesia Care | Site: Eye | Laterality: Left

## 2018-11-08 MED ORDER — MOXIFLOXACIN HCL 0.5 % OP SOLN: 1.0000 [drp] | Freq: Once | OPHTHALMIC | Status: DC

## 2018-11-08 MED ORDER — MIDAZOLAM HCL 2 MG/2ML IJ SOLN
INTRAMUSCULAR | Status: DC | PRN
  Administered 2018-11-08: 1 mg via INTRAVENOUS
  Administered 2018-11-08: 2 mg via INTRAVENOUS
  Administered 2018-11-08: 1 mg via INTRAVENOUS

## 2018-11-08 MED ORDER — POVIDONE-IODINE 5 % OP SOLN
OPHTHALMIC | Status: DC | PRN
  Administered 2018-11-08: 1 via OPHTHALMIC

## 2018-11-08 MED ORDER — TRYPAN BLUE 0.06 % OP SOLN
OPHTHALMIC | Status: AC
  Filled 2018-11-08: qty 0.5

## 2018-11-08 MED ORDER — MOXIFLOXACIN HCL 0.5 % OP SOLN
OPHTHALMIC | Status: DC | PRN
  Administered 2018-11-08: .2 mL via OPHTHALMIC

## 2018-11-08 MED ORDER — LIDOCAINE HCL (PF) 4 % IJ SOLN
INTRAOCULAR | Status: DC | PRN
  Administered 2018-11-08: 2.25 mL via OPHTHALMIC

## 2018-11-08 MED ORDER — TETRACAINE HCL 0.5 % OP SOLN
1.0000 [drp] | Freq: Once | OPHTHALMIC | Status: AC
  Administered 2018-11-08 (×2): 1 [drp] via OPHTHALMIC

## 2018-11-08 MED ORDER — TRYPAN BLUE 0.06 % OP SOLN
OPHTHALMIC | Status: DC | PRN
  Administered 2018-11-08: .5 mL via INTRAOCULAR

## 2018-11-08 MED ORDER — NA CHONDROIT SULF-NA HYALURON 40-17 MG/ML IO SOLN
INTRAOCULAR | Status: DC | PRN
  Administered 2018-11-08: 1 mL via INTRAOCULAR

## 2018-11-08 MED ORDER — TRIAMCINOLONE ACETONIDE 40 MG/ML IJ SUSP
INTRAMUSCULAR | Status: DC | PRN
  Administered 2018-11-08: 40 mg

## 2018-11-08 MED ORDER — PROPOFOL 10 MG/ML IV BOLUS
INTRAVENOUS | Status: DC | PRN
  Administered 2018-11-08: 20 mg via INTRAVENOUS

## 2018-11-08 MED ORDER — EPINEPHRINE PF 1 MG/ML IJ SOLN
INTRAOCULAR | Status: DC | PRN
  Administered 2018-11-08: 1 mL via OPHTHALMIC

## 2018-11-08 MED ORDER — TRIAMCINOLONE ACETONIDE 40 MG/ML IJ SUSP
INTRAMUSCULAR | Status: AC
  Filled 2018-11-08: qty 1

## 2018-11-08 MED ORDER — FENTANYL CITRATE (PF) 100 MCG/2ML IJ SOLN
INTRAMUSCULAR | Status: DC | PRN
  Administered 2018-11-08 (×2): 25 ug via INTRAVENOUS
  Administered 2018-11-08: 50 ug via INTRAVENOUS

## 2018-11-08 MED ORDER — ARMC OPHTHALMIC DILATING DROPS
1.0000 "application " | OPHTHALMIC | Status: AC
  Administered 2018-11-08 (×3): 1 via OPHTHALMIC

## 2018-11-08 MED ORDER — SODIUM CHLORIDE 0.9 % IV SOLN
INTRAVENOUS | Status: DC
  Administered 2018-11-08: 08:00:00 via INTRAVENOUS

## 2018-11-08 MED ORDER — EPINEPHRINE PF 1 MG/ML IJ SOLN
INTRAMUSCULAR | Status: AC
  Filled 2018-11-08: qty 1

## 2018-11-08 MED ORDER — MOXIFLOXACIN HCL 0.5 % OP SOLN
OPHTHALMIC | Status: AC
  Filled 2018-11-08: qty 3

## 2018-11-08 MED ORDER — MIDAZOLAM HCL 2 MG/2ML IJ SOLN
INTRAMUSCULAR | Status: AC
  Filled 2018-11-08: qty 2

## 2018-11-08 MED ORDER — NA CHONDROIT SULF-NA HYALURON 40-17 MG/ML IO SOLN
INTRAOCULAR | Status: AC
  Filled 2018-11-08: qty 1

## 2018-11-08 MED ORDER — FENTANYL CITRATE (PF) 100 MCG/2ML IJ SOLN
INTRAMUSCULAR | Status: AC
  Filled 2018-11-08: qty 2

## 2018-11-08 MED ORDER — POVIDONE-IODINE 5 % OP SOLN
OPHTHALMIC | Status: AC
  Filled 2018-11-08: qty 30

## 2018-11-08 MED ORDER — PROPOFOL 10 MG/ML IV BOLUS
INTRAVENOUS | Status: AC
  Filled 2018-11-08: qty 20

## 2018-11-08 MED ORDER — NA HYALUR & NA CHOND-NA HYALUR 0.55-0.5 ML IO KIT
PACK | INTRAOCULAR | Status: AC
  Filled 2018-11-08: qty 1.05

## 2018-11-08 MED ORDER — ARMC OPHTHALMIC DILATING DROPS
OPHTHALMIC | Status: AC
  Filled 2018-11-08: qty 0.5

## 2018-11-08 MED ORDER — CARBACHOL 0.01 % IO SOLN
INTRAOCULAR | Status: DC | PRN
  Administered 2018-11-08: .5 mL via INTRAOCULAR

## 2018-11-08 MED ORDER — LIDOCAINE HCL (PF) 4 % IJ SOLN
INTRAMUSCULAR | Status: AC
  Filled 2018-11-08: qty 5

## 2018-11-08 MED ORDER — ARMC OPHTHALMIC DILATING DROPS
OPHTHALMIC | Status: AC
  Administered 2018-11-08: 1 via OPHTHALMIC
  Filled 2018-11-08: qty 0.5

## 2018-11-08 MED ORDER — TETRACAINE HCL 0.5 % OP SOLN
OPHTHALMIC | Status: AC
  Filled 2018-11-08: qty 4

## 2018-11-08 MED ORDER — NA HYALUR & NA CHOND-NA HYALUR 0.55-0.5 ML IO KIT
PACK | INTRAOCULAR | Status: DC | PRN
  Administered 2018-11-08: 1 via OPHTHALMIC

## 2018-11-08 SURGICAL SUPPLY — 30 items
BNDG EYE OVAL (GAUZE/BANDAGES/DRESSINGS) ×4 IMPLANT
DISSECTOR HYDRO NUCLEUS 50X22 (MISCELLANEOUS) ×12 IMPLANT
DRSG TEGADERM 2-3/8X2-3/4 SM (GAUZE/BANDAGES/DRESSINGS) ×3 IMPLANT
GLOVE BIOGEL M 6.5 STRL (GLOVE) ×3 IMPLANT
GLOVE PI ULTRA LF STRL 7.5 (GLOVE) IMPLANT
GLOVE PI ULTRA NON LATEX 7.5 (GLOVE) ×2
GLOVE SURG LX 6.5 MICRO (GLOVE) ×2
GLOVE SURG LX STRL 6.5 MICRO (GLOVE) IMPLANT
GOWN STRL REUS W/ TWL LRG LVL3 (GOWN DISPOSABLE) ×1 IMPLANT
GOWN STRL REUS W/ TWL XL LVL3 (GOWN DISPOSABLE) ×1 IMPLANT
GOWN STRL REUS W/TWL LRG LVL3 (GOWN DISPOSABLE) ×3
GOWN STRL REUS W/TWL XL LVL3 (GOWN DISPOSABLE) ×3
KNIFE 45D UP 2.3 (MISCELLANEOUS) ×3 IMPLANT
LABEL CATARACT MEDS ST (LABEL) ×3 IMPLANT
LENS IOL ACRYSOF IQ 21.0 (Intraocular Lens) ×2 IMPLANT
NDL SAFETY ECLIPSE 18X1.5 (NEEDLE) IMPLANT
NEEDLE HYPO 18GX1.5 SHARP (NEEDLE) ×3
PACK CATARACT (MISCELLANEOUS) ×3 IMPLANT
PACK CATARACT KING (MISCELLANEOUS) ×3 IMPLANT
PACK EYE AFTER SURG (MISCELLANEOUS) ×5 IMPLANT
PACK VIT ANT 23G (MISCELLANEOUS) ×2 IMPLANT
RETRACTOR CAPSULE (MISCELLANEOUS) ×2 IMPLANT
RING CAPSULAR 14A LEFT (Ring) ×2 IMPLANT
SOL BSS BAG (MISCELLANEOUS) ×3
SOLUTION BSS BAG (MISCELLANEOUS) ×1 IMPLANT
SYR 3ML LL SCALE MARK (SYRINGE) ×2 IMPLANT
SYR 5ML LL (SYRINGE) ×2 IMPLANT
WATER STERILE IRR 250ML POUR (IV SOLUTION) ×3 IMPLANT
WIPE NON LINTING 3.25X3.25 (MISCELLANEOUS) ×3 IMPLANT
mst capsule retractor chang modification ×2 IMPLANT

## 2018-11-08 NOTE — OR Nursing (Signed)
Dr Andree Elk aware of am CBG of 237 upon arrival this am and beta blocker given 11/07/18.

## 2018-11-08 NOTE — Transfer of Care (Signed)
Immediate Anesthesia Transfer of Care Note  Patient: Miguel Harvey  Procedure(s) Performed: CATARACT EXTRACTION PHACO AND INTRAOCULAR LENS PLACEMENT (IOC)  LEFT VISION BLUE (Left Eye)  Patient Location: Short Stay  Anesthesia Type:MAC  Level of Consciousness: awake and alert   Airway & Oxygen Therapy: Patient Spontanous Breathing  Post-op Assessment: Report given to RN and Post -op Vital signs reviewed and stable  Post vital signs: Reviewed  Last Vitals:  Vitals Value Taken Time  BP 138/82 11/08/18 1246  Temp 36.7 C 11/08/18 1246  Pulse 89 11/08/18 1246  Resp 16 11/08/18 1246  SpO2 98 % 11/08/18 1246    Last Pain:  Vitals:   11/08/18 0743  TempSrc: Tympanic  PainSc: 0-No pain         Complications: No apparent anesthesia complications

## 2018-11-08 NOTE — OR Nursing (Addendum)
Report received by Bonney Aid RN from [place]  For last dose of medications.

## 2018-11-08 NOTE — H&P (Signed)
   I have reviewed the patient's H&P and agree with its findings. There have been no interval changes.  Jakeb Lamping MD Ophthalmology 

## 2018-11-08 NOTE — Discharge Instructions (Signed)

## 2018-11-08 NOTE — Op Note (Addendum)
  PREOPERATIVE DIAGNOSIS:  Nuclear sclerotic cataract of the LEFT eye.   POSTOPERATIVE DIAGNOSIS:  Nuclear sclerotic cataract of the LEFT eye.   OPERATIVE PROCEDURE: Cataract surgery OS   SURGEON:  Marchia Meiers, MD; Mali Brasington MD   ANESTHESIA:  Anesthesiologist: Molli Barrows, MD; Gunnar Fusi, MD CRNA: Justus Memory, CRNA; Rolla Plate, CRNA  1.      Managed anesthesia care. 2.     0.95ml of Shugarcaine was instilled following the paracentesis   COMPLICATIONS:  Zonular dehiscence; anterior vitrectomy   TECHNIQUE:   Divide and conquer   DESCRIPTION OF PROCEDURE:  The patient was examined and consented in the preoperative holding area where the aforementioned topical anesthesia was applied to the LEFT eye and then brought back to the Operating Room where the left eye was prepped and draped in the usual sterile ophthalmic fashion and a lid speculum was placed. A paracentesis was created with the side port blade, the anterior chamber was washed out with trypan blue to stain the anterior capsule, and the anterior chamber was filled with viscoelastic. A near clear corneal incision was performed with the steel keratome. A continuous curvilinear capsulorrhexis was performed with a cystotome followed by the capsulorrhexis forceps. Hydrodissection and hydrodelineation were carried out with BSS on a blunt cannula.   While the lens was being sculpted, subincisional zonular dehiscence was noted, and Mali Brasington assisted with the remaining case. Two additional side ports were created, in which capsular hooks were inserted to support the bag.  The remaining nuclear and cortical material was removed in a divide and conquer technique, followed by the irrigation-aspiration handpiece. A capsular tension ring was placed in the bag.  The capsular bag was inflated with viscoelastic and the lens was placed in the capsular bag without complication. The remaining viscoelastic was removed from  the eye with the irrigation-aspiration handpiece.   At this point, it was noted that a small amount of vitreous had leaked into the anterior chamber. Two additional side ports were created, through which kenalog was injected, and through which an anterior vitrectomy was performed.  No remaining vitreous remained visible in the anterior chamber.    The wounds were hydrated. The anterior chamber was flushed and the eye was inflated to physiologic pressure. 0.32ml Vigamox was placed in the anterior chamber. The wounds were found to be water tight. The eye was dressed with maxitrol ointment and a shield was placed over the eye. The patient was also given drops with which to begin a drop regimen and will follow-up with me in one day. Implant Name Type Inv. Item Serial No. Manufacturer Lot No. LRB No. Used Action  LENS IOL ACRYSOF IQ 21.0 - [id number] Intraocular Lens LENS IOL ACRYSOF IQ 21.0 [id number] ALCON  Left 1 Implanted  Myopia CTR, PC, V.1.4 Capsular Tension Ring     [id number] Left 1 Implanted    Procedure(s) with comments: CATARACT EXTRACTION PHACO AND INTRAOCULAR LENS PLACEMENT (IOC)  LEFT VISION BLUE (Left) - [place] 03:38.9 CDE 42.37 Fluid pack lot # [id number]  Electronically signed: Alessa Mazur 11/08/2018 1:00 PM

## 2018-11-08 NOTE — Anesthesia Post-op Follow-up Note (Signed)
Anesthesia QCDR form completed.        

## 2018-11-08 NOTE — Anesthesia Preprocedure Evaluation (Signed)
Anesthesia Evaluation  Patient identified by MRN, date of birth, ID band Patient awake    Reviewed: Allergy & Precautions, H&P , NPO status , Patient's Chart, lab work & pertinent test results, reviewed documented beta blocker date and time   Airway Mallampati: II  TM Distance: >3 FB Neck ROM: full    Dental no notable dental hx. (+) Teeth Intact   Pulmonary neg pulmonary ROS, Patient abstained from smoking., former smoker,    Pulmonary exam normal breath sounds clear to auscultation       Cardiovascular Exercise Tolerance: Good hypertension, + CAD, + Peripheral Vascular Disease and +CHF   Rhythm:regular Rate:Normal     Neuro/Psych PSYCHIATRIC DISORDERS Depression  Neuromuscular disease    GI/Hepatic negative GI ROS, Neg liver ROS,   Endo/Other  negative endocrine ROSdiabetes  Renal/GU Renal disease     Musculoskeletal   Abdominal   Peds  Hematology negative hematology ROS (+)   Anesthesia Other Findings   Reproductive/Obstetrics negative OB ROS                             Anesthesia Physical Anesthesia Plan  ASA: IV  Anesthesia Plan: MAC   Post-op Pain Management:    Induction:   PONV Risk Score and Plan:   Airway Management Planned:   Additional Equipment:   Intra-op Plan:   Post-operative Plan:   Informed Consent: I have reviewed the patients History and Physical, chart, labs and discussed the procedure including the risks, benefits and alternatives for the proposed anesthesia with the patient or authorized representative who has indicated his/her understanding and acceptance.       Plan Discussed with: CRNA  Anesthesia Plan Comments:         Anesthesia Quick Evaluation

## 2018-11-08 NOTE — Anesthesia Postprocedure Evaluation (Signed)
Anesthesia Post Note  Patient: Miguel Harvey  Procedure(s) Performed: CATARACT EXTRACTION PHACO AND INTRAOCULAR LENS PLACEMENT (IOC)  LEFT VISION BLUE (Left Eye)  Patient location during evaluation: Short Stay Anesthesia Type: MAC Level of consciousness: awake and alert Pain management: pain level controlled Vital Signs Assessment: post-procedure vital signs reviewed and stable Respiratory status: spontaneous breathing, nonlabored ventilation and respiratory function stable Cardiovascular status: stable and blood pressure returned to baseline Postop Assessment: no apparent nausea or vomiting Anesthetic complications: no     Last Vitals:  Vitals:   11/08/18 0743 11/08/18 1246  BP: 129/77 138/82  Pulse: 95 89  Resp: 18 16  Temp: (!) 36.4 C 36.7 C  SpO2: 98% 98%    Last Pain:  Vitals:   11/08/18 0743  TempSrc: Tympanic  PainSc: 0-No pain                 Rolla Plate P

## 2018-11-25 ENCOUNTER — Encounter: Payer: Self-pay | Admitting: *Deleted

## 2018-12-01 ENCOUNTER — Other Ambulatory Visit: Admission: RE | Admit: 2018-12-01 | Payer: Medicare Other | Source: Ambulatory Visit

## 2018-12-01 ENCOUNTER — Encounter: Admission: RE | Payer: Self-pay | Source: Home / Self Care

## 2018-12-01 ENCOUNTER — Ambulatory Visit: Admission: RE | Admit: 2018-12-01 | Payer: Medicare Other | Source: Home / Self Care

## 2018-12-01 HISTORY — DX: Chronic obstructive pulmonary disease, unspecified: J44.9

## 2018-12-01 HISTORY — DX: Other complications of anesthesia, initial encounter: T88.59XA

## 2018-12-01 SURGERY — PHACOEMULSIFICATION, CATARACT, WITH IOL INSERTION
Anesthesia: Topical | Laterality: Right

## 2019-03-16 ENCOUNTER — Other Ambulatory Visit (INDEPENDENT_AMBULATORY_CARE_PROVIDER_SITE_OTHER): Payer: Self-pay | Admitting: Nurse Practitioner

## 2019-03-16 DIAGNOSIS — I739 Peripheral vascular disease, unspecified: Secondary | ICD-10-CM

## 2019-03-27 ENCOUNTER — Encounter (INDEPENDENT_AMBULATORY_CARE_PROVIDER_SITE_OTHER): Payer: Medicare Other

## 2019-03-27 ENCOUNTER — Ambulatory Visit (INDEPENDENT_AMBULATORY_CARE_PROVIDER_SITE_OTHER): Payer: Medicare Other | Admitting: Nurse Practitioner

## 2019-04-27 NOTE — Progress Notes (Signed)
Cardiology Office Note    Date:  05/02/2019   ID:  [name], [name] 09-09-1945, MRN [id number]  PCP:  Patient, No Pcp Per  Cardiologist:  Julien Nordmann, MD  Electrophysiologist:  None   Chief Complaint: Follow-up  History of Present Illness:   Miguel Harvey is a 74 y.o. male with history of nonobstructive CAD by LHC in 05/2011 with 10-20% ostial LAD stenosis at that time, HFrEF secondary to NICM, pulmonary hypertension, NSVT, PEA arrest in 07/2017, hyperkalemia on ACE inhibitor and spironolactone, PAD s/p right BKA s/p balloon angioplastyto the left peroneal artery, left posterior tibial artery, tibioperoneal trunk, and stenting to the distal left SFA in 07/2017, osteomyelitis of the left foot, DM2, HTN, HLD, anemia, tobacco abuse, and obesity who presents for follow up of his HFrEF.   Echo from 05/08/2011 showed an EF of 50-55%, mild LVH, normal wall motion, Gr2DD, mildly dilated LA. Repeat echo on 05/12/2011 showed an EF of 40-45%, mild concentric LVH, global HK, no evidence of LV thrombus. Cath in 05/2011 showed minimal CAD with 10-20% ostial LAD stenosis. Patient was admitted to the hospital in 06/2017 with sepsis from a diabetic foot infection. Echo on 06/30/2017 showed an EF of 40%, diffuse HK, Gr2DD, mild MR, mildly dilated LA, RV cavity size normal with normal RVSF, PASP 63 mmHg. Repeat echo on 07/03/2017 showed an EF of 25-30%, diffuse HK, mild LVH, mild MR, mild PASP. No ischemic evaluation was performed at that time. Patient was admitted to [facility] in 07/2017 with left foot osteomyelitis. While he was undergoing anesthesia induction for metatarsal amputation he suffered a PEA arrest, felt to be in the setting decompensated heart failure. Echo on 08/06/2017 showed an EF of 20-25%, diffuse HK, moderate MR, mildly dilated LA, mildly dilated RV with mildly reduced RVSF, mildly dilated RA, PASP 68 mmHg. He was IV diuresed with a discharge weight of 185 pounds. Outpatient ischemic evaluation was advised to  be considered. He was not felt to be a good candidate for ICD given his comorbid conditions and overall poor prognosis.  Lexiscan Myoview on 11/01/17 was negative for evidence of ischemia or infarct, EF 30-44%. This was an intermediate risk study based on his low EF which was felt to be NICM.   He was last seen in the office in 12/2017 for follow-up and was doing well from a cardiac perspective.  He comes in doing very well today.  He denies any chest pain, shortness of breath, palpitations, dizziness, presyncope, syncope.  No swelling of the left lower extremity or right stump site.  No abdominal distention, orthopnea, PND, or early satiety.  He is tolerating all medications without issues.  Of note, upon reviewing his medication list from his living facility it appears they have continued him on spironolactone which was previously recommended to be discontinued in 07/2017 as well as KCl, and lisinopril.  No recent labs available for review.  He is drinking less than 2 L of fluid per day and does not add salt to his food.  He indicates he would like to have "some [employer]."  He does not have any issues or concerns at this time.   Labs independently reviewed: 12/2017 - BUN 20, serum creatinine 1.24, potassium 4.9, magnesium 1.7 09/2017 - TSH 4.84, Hgb 10.3, PLT 212 07/2017 - A1c 9.5, albumin 2.4, AST/ALT normal, BNP 1627  Past Medical History:  Diagnosis Date  . Abnormal posture   . CHF (congestive heart failure) (HCC)   . Chronic kidney  disease    CKD   stage 2  . Complication of anesthesia    heart stopped x2 during toe amputation  . COPD (chronic obstructive pulmonary disease) (HCC)   . Coronary artery disease, non-occlusive    a. LHC 05/2011: 10-20% ostial LAD stenosis  . Depression   . Diabetes mellitus   . HLD (hyperlipidemia)   . Hypertension   . Muscle weakness   . Neuromuscular disorder (HCC)    neuropathy  . NICM (nonischemic cardiomyopathy) (HCC)    a. TTE 2/213: EF 50-55%, mild  LVH, normal wall motion, Gr2DD, mildly dilated LA; b. TTE 2/13: EF 40-45%, mild concentric LVH, global HK, no evidence of LV thrombus; c. TTE 4/19: EF 40%, diffuse HK, Gr2DD, mild MR, mildly dilated LA, RV cavity size normal w/ normal RVSF, PASP 63; d. TTE 4/19: EF 25-30%, diffuse HK, mild LVH, mild MR, mild PASP; e. TTE 5/19: EF 20-25%, diffuse HK, moderate MR,  . NSVT (nonsustained ventricular tachycardia) (HCC)   . PAD (peripheral artery disease) (HCC)    a. s/p right BKA s/p  recent balloon angioplasty fo the left peroneal artery, left posterior tibial artery, tibioperoneal trunk, and stenting to the distal left SFA in 07/2017  . PEA (Pulseless electrical activity) (HCC)    heart stopped beating during surgery for amputation of toes    Past Surgical History:  Procedure Laterality Date  . AMPUTATION Right 07/02/2017   Procedure: RIGHT BELOW KNEE AMPUTATION;  Surgeon: Nadara Mustard, MD;  Location[facility] OR;  Service: Orthopedics;  Laterality: Right;  . CATARACT EXTRACTION W/PHACO Left 11/08/2018   Procedure: CATARACT EXTRACTION PHACO AND INTRAOCULAR LENS PLACEMENT (IOC)  LEFT VISION BLUE;  Surgeon: Elliot Cousin, MD;  Location: [facility] ORS;  Service: Ophthalmology;  Laterality: Left;  [place] 03:38.9 CDE 42.37 Fluid pack lot # [id number]  . cyst removal from  right leg about 6 years ago    . IRRIGATION AND DEBRIDEMENT FOOT Left 08/10/2017   Procedure: IRRIGATION AND DEBRIDEMENT FOOT;  Surgeon: Gwyneth Revels, DPM;  Location: [facility] ORS;  Service: Podiatry;  Laterality: Left;  . LEFT HEART CATHETERIZATION WITH CORONARY ANGIOGRAM N/A 05/11/2011   Procedure: LEFT HEART CATHETERIZATION WITH CORONARY ANGIOGRAM;  Surgeon: Lennette Bihari, MD;  Location: [facility] CATH LAB;  Service: Cardiovascular;  Laterality: N/A;  . LOWER EXTREMITY ANGIOGRAPHY Left 08/13/2017   Procedure: Lower Extremity Angiography and PICC line placement;  Surgeon: Annice Needy, MD;  Location: [facility] INVASIVE CV LAB;  Service: Cardiovascular;  Laterality:  Left;  . ruptured navel     repair of  . TOE AMPUTATION     not sure of when this surgery occured, but his heart stopped beating x 2 during surgery    Current Medications: Current Meds  Medication Sig  . albuterol (ACCUNEB) 0.63 MG/3ML nebulizer solution Take 1 ampule by nebulization every 3 (three) hours as needed for wheezing.  Marland Kitchen aspirin EC 81 MG EC tablet Take 1 tablet (81 mg total) by mouth daily.  Marland Kitchen atorvastatin (LIPITOR) 20 MG tablet Take 20 mg by mouth every evening.   . carvedilol (COREG) 6.25 MG tablet Take 6.25 mg by mouth 2 (two) times daily with a meal.  . clopidogrel (PLAVIX) 75 MG tablet Take 1 tablet (75 mg total) by mouth daily.  . Dulaglutide (TRULICITY) 1.5 MG/0.5ML SOPN Inject 1.5 mg into the skin every Friday.  . furosemide (LASIX) 40 MG tablet Take 1 tablet (40 mg total) by mouth daily.  Marland Kitchen guaiFENesin-dextromethorphan (ROBITUSSIN DM) 100-10 MG/5ML  syrup Take 10 mLs by mouth every 4 (four) hours as needed for cough.  . insulin lispro (HUMALOG) 100 UNIT/ML KwikPen Inject 5 Units into the skin 3 (three) times daily. HOLD IF CBG < or = 150  . LANTUS SOLOSTAR 100 UNIT/ML Solostar Pen Inject 5 Units into the skin daily.   Marland Kitchen linagliptin (TRADJENTA) 5 MG TABS tablet Take 5 mg by mouth daily.  Marland Kitchen lisinopril (PRINIVIL,ZESTRIL) 10 MG tablet Take 1 tablet (10 mg total) by mouth daily.  . Magnesium 400 MG TABS Take 400 mg by mouth 2 (two) times daily.   . metFORMIN (GLUCOPHAGE) 500 MG tablet Take 500 mg by mouth 2 (two) times daily with a meal.   . Multiple Vitamin (MULTIVITAMIN WITH MINERALS) TABS tablet Take 1 tablet by mouth daily.  . potassium chloride SA (K-DUR,KLOR-CON) 20 MEQ tablet Take 20 mEq by mouth every other day.   . Probiotic Product (ALIGN) 4 MG CAPS Take 4 mg by mouth daily.  . sertraline (ZOLOFT) 25 MG tablet Take 25 mg by mouth daily.   . sertraline (ZOLOFT) 50 MG tablet Take 50 mg by mouth daily.  Marland Kitchen spironolactone (ALDACTONE) 25 MG tablet Take 25 mg by mouth  daily.  . tamsulosin (FLOMAX) 0.4 MG CAPS capsule Take 1 capsule (0.4 mg total) by mouth daily.    Allergies:   Spironolactone   Social History   Socioeconomic History  . Marital status: Single    Spouse name: danielle .... daughter  . Number of children: Not on file  . Years of education: Not on file  . Highest education level: Not on file  Occupational History  . Not on file  Tobacco Use  . Smoking status: Former Smoker    Packs/day: 2.00    Years: 40.00    Pack years: 80.00    Types: Cigarettes  . Smokeless tobacco: Former Systems developer    Quit date: 05/07/2011  Substance and Sexual Activity  . Alcohol use: Yes    Alcohol/week: 6.0 standard drinks    Types: 6 Cans of beer per week  . Drug use: Never  . Sexual activity: Not on file  Other Topics Concern  . Not on file  Social History Narrative  . Not on file   Social Determinants of Health   Financial Resource Strain:   . Difficulty of Paying Living Expenses: Not on file  Food Insecurity:   . Worried About Charity fundraiser in the Last Year: Not on file  . Ran Out of Food in the Last Year: Not on file  Transportation Needs:   . Lack of Transportation (Medical): Not on file  . Lack of Transportation (Non-Medical): Not on file  Physical Activity:   . Days of Exercise per Week: Not on file  . Minutes of Exercise per Session: Not on file  Stress:   . Feeling of Stress : Not on file  Social Connections:   . Frequency of Communication with Friends and Family: Not on file  . Frequency of Social Gatherings with Friends and Family: Not on file  . Attends Religious Services: Not on file  . Active Member of Clubs or Organizations: Not on file  . Attends Archivist Meetings: Not on file  . Marital Status: Not on file     Family History:  The patient's family history includes CAD in an other family member; Diabetes in an other family member.  ROS:   Review of Systems  Constitutional: Positive for malaise/fatigue.  Negative  for chills, diaphoresis, fever and weight loss.  HENT: Negative for congestion.   Eyes: Negative for discharge and redness.  Respiratory: Negative for cough, hemoptysis, sputum production, shortness of breath and wheezing.   Cardiovascular: Negative for chest pain, palpitations, orthopnea, claudication, leg swelling and PND.  Gastrointestinal: Negative for abdominal pain, blood in stool, heartburn, melena, nausea and vomiting.  Genitourinary: Negative for hematuria.  Musculoskeletal: Negative for falls and myalgias.  Skin: Negative for rash.  Neurological: Negative for dizziness, tingling, tremors, sensory change, speech change, focal weakness, loss of consciousness and weakness.  Endo/Heme/Allergies: Does not bruise/bleed easily.  Psychiatric/Behavioral: Negative for substance abuse. The patient is not nervous/anxious.   All other systems reviewed and are negative.    EKGs/Labs/Other Studies Reviewed:    Studies reviewed were summarized above. The additional studies were reviewed today:  2D echo 07/2017: - Left ventricle: The cavity size was mildly dilated. There was  mild concentric hypertrophy. Systolic function was severely  reduced. The estimated ejection fraction was in the range of 20%  to 25%. Diffuse hypokinesis. Regional wall motion abnormalities  cannot be excluded. The study is not technically sufficient to  allow evaluation of LV diastolic function.  - Mitral valve: There was moderate regurgitation.  - Left atrium: The atrium was mildly dilated.  - Right ventricle: The cavity size was mildly dilated. Wall  thickness was normal. Systolic function was mildly reduced.  - Right atrium: The atrium was mildly dilated.  - Tricuspid valve: There was moderate regurgitation.  - Pulmonary arteries: Systolic pressure was severely elevated. PA  peak pressure: 68 mm Hg (S).  __________  MPI 10/2017:  There was no ST segment deviation noted during  stress.  No T wave inversion was noted during stress.  This is an intermediate risk study due to low EF  The left ventricular ejection fraction is moderately decreased (30-44%). Likely nonischemic cardiomyopathy.  No evidence of ischemia or infarcts.   EKG:  EKG is ordered today.  The EKG ordered today demonstrates NSR, 81 bpm, sinus arrhythmia, low voltage QRS, no acute ST-T changes  Recent Labs: No results found for requested labs within last 8760 hours.  Recent Lipid Panel    Component Value Date/Time   CHOL 190 05/16/2012 0948   TRIG 69 08/12/2017 1332   HDL 39 (L) 05/16/2012 0948   CHOLHDL 4.9 05/16/2012 0948   VLDL 18 05/16/2012 0948   LDLCALC 133 (H) 05/16/2012 0948    PHYSICAL EXAM:    VS:  BP 110/64 (BP Location: Left Arm, Patient Position: Sitting, Cuff Size: Normal)   Pulse 81   Ht 6' (1.829 m)   Wt 210 lb (95.3 kg) Comment: weight at [facility] one month ago.  BMI 28.48 kg/m   BMI: Body mass index is 28.48 kg/m.  Physical Exam  Constitutional: He is oriented to person, place, and time. He appears well-developed and well-nourished.  HENT:  Head: Normocephalic and atraumatic.  Eyes: Right eye exhibits no discharge. Left eye exhibits no discharge.  Neck: No JVD present.  Cardiovascular: Normal rate, regular rhythm, S1 normal and S2 normal. Exam reveals no distant heart sounds, no friction rub, no midsystolic click and no opening snap.  Murmur heard. High-pitched blowing holosystolic murmur is present with a grade of 1/6 at the apex. Pulses:      Posterior tibial pulses are 1+ on the left side.  Pulmonary/Chest: Effort normal and breath sounds normal. No respiratory distress. He has no decreased breath sounds. He has  no wheezes. He has no rales. He exhibits no tenderness.  Abdominal: Soft. He exhibits no distension. There is no abdominal tenderness.  Musculoskeletal:        General: No edema.     Cervical back: Normal range of motion.      Comments: S/P right BKA  Neurological: He is alert and oriented to person, place, and time.  Skin: Skin is warm and dry. No cyanosis. Nails show no clubbing.  Psychiatric: He has a normal mood and affect. His speech is normal and behavior is normal. Judgment and thought content normal.    Wt Readings from Last 3 Encounters:  05/02/19 210 lb (95.3 kg)  11/03/18 217 lb 15.9 oz (98.9 kg)  09/23/18 230 lb (104.3 kg)     ASSESSMENT & PLAN:   1. HFrEF secondary to NICM: He is euvolemic and well compensated.  As outlined below and #6, he has a history of hyperkalemia on lisinopril and spironolactone with both of these medications being discontinued in mid 2019 secondary to hyperkalemia.  Lisinopril was subsequently restarted with stable potassium thereafter.  Patient has been receiving lisinopril, spironolactone, and KCl along with carvedilol and furosemide at [facility].  We will check a BMP today to evaluate his renal function and potassium with further recommendations following.  His relative hypotension has previously precluded transition to [facility].  He has not been felt to be a candidate for ICD given comorbid conditions.  CHF education discussed in detail.  2. NSVT/PVCs: Asymptomatic.  Continue carvedilol 6.25 mg twice daily.  No indication for antiarrhythmic therapy at this time.  Patient has previously been felt to not be a good candidate for ICD therapy given his severe comorbid conditions.  3. Mitral regurgitation: Asymptomatic.  Update echo.  Given his comorbid conditions it remains uncertain if he would be a candidate for valve replacement/repair should his regurgitation worsen.  4. HTN: Blood pressure is well controlled today.  For now, continue carvedilol, furosemide, lisinopril, and spironolactone pending BMP obtained today with further recommendations regarding lisinopril and spironolactone to be made upon this resulted.  Low-sodium diet.  5. PAD: No symptoms of worsening  claudication.  Remains on DAPT.  Followed by vascular surgery.  6. History of hyperkalemia: Patient was previously advised to hold spironolactone and lisinopril in mid to late 2019 with hyperkalemia resolving following the discontinuation of spironolactone and potassium being stable with resumption of lisinopril.  Upon reviewing his medication list from [facility] today it appears he has been continued on spironolactone, KCl, and lisinopril dating back to 07/2017.  Check BMP today with further recommendations pending.  Disposition: F/u with Dr. Mariah Milling or an APP in 3 months.   Medication Adjustments/Labs and Tests Ordered: Current medicines are reviewed at length with the patient today.  Concerns regarding medicines are outlined above. Medication changes, Labs and Tests ordered today are summarized above and listed in the Patient Instructions accessible in Encounters.   Signed, Eula Listen, PA-C 05/02/2019 2:13 PM     [facility] [address] [place], [place] [address] [phone]

## 2019-05-02 ENCOUNTER — Ambulatory Visit (INDEPENDENT_AMBULATORY_CARE_PROVIDER_SITE_OTHER): Payer: Medicare Other | Admitting: Physician Assistant

## 2019-05-02 ENCOUNTER — Encounter: Payer: Self-pay | Admitting: Physician Assistant

## 2019-05-02 ENCOUNTER — Other Ambulatory Visit: Payer: Self-pay

## 2019-05-02 VITALS — BP 110/64 | HR 81 | Ht 72.0 in | Wt 210.0 lb

## 2019-05-02 DIAGNOSIS — I472 Ventricular tachycardia: Secondary | ICD-10-CM | POA: Diagnosis not present

## 2019-05-02 DIAGNOSIS — I10 Essential (primary) hypertension: Secondary | ICD-10-CM

## 2019-05-02 DIAGNOSIS — I428 Other cardiomyopathies: Secondary | ICD-10-CM

## 2019-05-02 DIAGNOSIS — I493 Ventricular premature depolarization: Secondary | ICD-10-CM | POA: Diagnosis not present

## 2019-05-02 DIAGNOSIS — I5022 Chronic systolic (congestive) heart failure: Secondary | ICD-10-CM

## 2019-05-02 DIAGNOSIS — I4729 Other ventricular tachycardia: Secondary | ICD-10-CM

## 2019-05-02 DIAGNOSIS — I739 Peripheral vascular disease, unspecified: Secondary | ICD-10-CM

## 2019-05-02 DIAGNOSIS — I340 Nonrheumatic mitral (valve) insufficiency: Secondary | ICD-10-CM

## 2019-05-02 DIAGNOSIS — E875 Hyperkalemia: Secondary | ICD-10-CM

## 2019-05-02 NOTE — Patient Instructions (Signed)
Medication Instructions:   1. Your physician recommends that you continue on your current medications as directed. Please refer to the Current Medication list given to you today.  *If you need a refill on your cardiac medications before your next appointment, please call your pharmacy*  Lab Work:  1. Your physician recommends that you have lab work today (CBC, BMET)  If you have labs (blood work) drawn today and your tests are completely normal, you will receive your results only by: Marland Kitchen MyChart Message (if you have MyChart) OR . A paper copy in the mail If you have any lab test that is abnormal or we need to change your treatment, we will call you to review the results.  Testing/Procedures:  1. Echocardiogram  Please return to [facility] on ______________ at _______________ AM/PM for an Echocardiogram. Your physician has requested that you have an echocardiogram. Echocardiography is a painless test that uses sound waves to create images of your heart. It provides your doctor with information about the size and shape of your heart and how well your heart's chambers and valves are working. This procedure takes approximately one hour. There are no restrictions for this procedure. Please note; depending on visual quality an IV may need to be placed.    Follow-Up: At [facility], you and your health needs are our priority.  As part of our continuing mission to provide you with exceptional heart care, we have created designated Provider Care Teams.  These Care Teams include your primary Cardiologist (physician) and Advanced Practice Providers (APPs -  Physician Assistants and Nurse Practitioners) who all work together to provide you with the care you need, when you need it.  Your next appointment:   3 month(s)  The format for your next appointment:   Either In Person or Virtual  Provider:    You may see Julien Nordmann, MD or one of the following Advanced Practice Providers  on your designated Care Team:    Nicolasa Ducking, NP  Eula Listen, PA-C  Marisue Ivan, PA-C

## 2019-05-03 ENCOUNTER — Telehealth: Payer: Self-pay

## 2019-05-03 LAB — BASIC METABOLIC PANEL
BUN/Creatinine Ratio: 18 (ref 10–24)
BUN: 20 mg/dL (ref 8–27)
CO2: 21 mmol/L (ref 20–29)
Calcium: 8.9 mg/dL (ref 8.6–10.2)
Chloride: 107 mmol/L — ABNORMAL HIGH (ref 96–106)
Creatinine, Ser: 1.13 mg/dL (ref 0.76–1.27)
GFR calc Af Amer: 74 mL/min/{1.73_m2} (ref >59)
GFR calc non Af Amer: 64 mL/min/{1.73_m2} (ref >59)
Glucose: 119 mg/dL — ABNORMAL HIGH (ref 65–99)
Potassium: 5 mmol/L (ref 3.5–5.2)
Sodium: 142 mmol/L (ref 134–144)

## 2019-05-03 LAB — CBC
Hematocrit: 36 % — ABNORMAL LOW (ref 37.5–51.0)
Hemoglobin: 11.3 g/dL — ABNORMAL LOW (ref 13.0–17.7)
MCH: 27.2 pg (ref 26.6–33.0)
MCHC: 31.4 g/dL — ABNORMAL LOW (ref 31.5–35.7)
MCV: 87 fL (ref 79–97)
Platelets: 269 10*3/uL (ref 150–450)
RBC: 4.15 x10E6/uL (ref 4.14–5.80)
RDW: 13.4 % (ref 11.6–15.4)
WBC: 9.1 10*3/uL (ref 3.4–10.8)

## 2019-05-03 NOTE — Telephone Encounter (Signed)
Call to residence to make staff aware of pt lab results.   Spoke with RN, Duwayne Heck.   She verbalized understanding and took verbal for changes in POC.   Repeat lab in 1 week and they will mail to [place].   Advised pt to call for any further questions or concerns.

## 2019-05-03 NOTE — Telephone Encounter (Signed)
-----   Message from Sondra Barges, PA-C sent at 05/03/2019  7:12 AM EST ----- Blood count is mildly low though improved from prior. Random glucose okay. Kidney function normal. Potassium is high normal.  Recommendations: Discontinue KCl For now, he may continue lisinopril and spironolactone in addition to his other cardiac medications Please have his facility draw BMET in 1 week and fax [place] the results to ensure stable potassium

## 2019-05-25 ENCOUNTER — Ambulatory Visit (INDEPENDENT_AMBULATORY_CARE_PROVIDER_SITE_OTHER): Payer: Medicare Other

## 2019-05-25 ENCOUNTER — Other Ambulatory Visit: Payer: Self-pay

## 2019-05-25 DIAGNOSIS — I428 Other cardiomyopathies: Secondary | ICD-10-CM | POA: Diagnosis not present

## 2019-05-29 ENCOUNTER — Telehealth: Payer: Self-pay

## 2019-05-29 NOTE — Telephone Encounter (Signed)
-----   Message from Creig Hines, NP sent at 05/29/2019  9:38 AM EST ----- Heart squeezing function is in low-end of normal @ 50-55%.  This is a significant improvement for him and is great news.  The heart muscle is moderately thick and stiff.  No significant valvular abnormalities (no leakiness to mitral valve noted this time - good news).  Continue medicines as outlined by Alycia Rossetti at most recent visit.

## 2019-05-29 NOTE — Telephone Encounter (Signed)
Call to patient to discuss echo results and POC, spoke to nurse Loraine Leriche.   Verbalized understanding and had no further questions at this time. No new orders.   Advised pt to call for any further questions or concerns.

## 2019-07-31 NOTE — Progress Notes (Deleted)
NO SHOW

## 2019-08-01 ENCOUNTER — Ambulatory Visit: Payer: Medicare Other | Admitting: Cardiovascular Disease

## 2019-08-01 DIAGNOSIS — I428 Other cardiomyopathies: Secondary | ICD-10-CM

## 2019-08-01 DIAGNOSIS — I472 Ventricular tachycardia: Secondary | ICD-10-CM

## 2019-08-01 DIAGNOSIS — I70269 Atherosclerosis of native arteries of extremities with gangrene, unspecified extremity: Secondary | ICD-10-CM

## 2019-08-01 DIAGNOSIS — I5022 Chronic systolic (congestive) heart failure: Secondary | ICD-10-CM

## 2019-08-04 ENCOUNTER — Ambulatory Visit (INDEPENDENT_AMBULATORY_CARE_PROVIDER_SITE_OTHER): Payer: Medicare Other | Admitting: Physician Assistant

## 2019-08-04 ENCOUNTER — Other Ambulatory Visit: Payer: Self-pay

## 2019-08-04 ENCOUNTER — Encounter: Payer: Self-pay | Admitting: Physician Assistant

## 2019-08-04 VITALS — BP 90/60 | HR 95 | Ht 71.75 in

## 2019-08-04 DIAGNOSIS — Z720 Tobacco use: Secondary | ICD-10-CM

## 2019-08-04 DIAGNOSIS — I493 Ventricular premature depolarization: Secondary | ICD-10-CM

## 2019-08-04 DIAGNOSIS — I428 Other cardiomyopathies: Secondary | ICD-10-CM

## 2019-08-04 DIAGNOSIS — E785 Hyperlipidemia, unspecified: Secondary | ICD-10-CM

## 2019-08-04 DIAGNOSIS — I5022 Chronic systolic (congestive) heart failure: Secondary | ICD-10-CM

## 2019-08-04 DIAGNOSIS — I5031 Acute diastolic (congestive) heart failure: Secondary | ICD-10-CM

## 2019-08-04 DIAGNOSIS — E11621 Type 2 diabetes mellitus with foot ulcer: Secondary | ICD-10-CM

## 2019-08-04 DIAGNOSIS — I472 Ventricular tachycardia: Secondary | ICD-10-CM

## 2019-08-04 DIAGNOSIS — E875 Hyperkalemia: Secondary | ICD-10-CM

## 2019-08-04 DIAGNOSIS — L97509 Non-pressure chronic ulcer of other part of unspecified foot with unspecified severity: Secondary | ICD-10-CM

## 2019-08-04 DIAGNOSIS — D649 Anemia, unspecified: Secondary | ICD-10-CM

## 2019-08-04 DIAGNOSIS — I739 Peripheral vascular disease, unspecified: Secondary | ICD-10-CM

## 2019-08-04 DIAGNOSIS — F17200 Nicotine dependence, unspecified, uncomplicated: Secondary | ICD-10-CM

## 2019-08-04 DIAGNOSIS — Z8674 Personal history of sudden cardiac arrest: Secondary | ICD-10-CM

## 2019-08-04 DIAGNOSIS — Z79899 Other long term (current) drug therapy: Secondary | ICD-10-CM

## 2019-08-04 DIAGNOSIS — I10 Essential (primary) hypertension: Secondary | ICD-10-CM

## 2019-08-04 DIAGNOSIS — I4729 Other ventricular tachycardia: Secondary | ICD-10-CM

## 2019-08-04 DIAGNOSIS — I272 Pulmonary hypertension, unspecified: Secondary | ICD-10-CM

## 2019-08-04 NOTE — Progress Notes (Signed)
Office Visit    Patient Name: Miguel Harvey Date of Encounter: 08/04/2019  Primary Care Provider:  Patient, No Pcp Per Primary Cardiologist:  Miguel Nordmann, MD  Chief Complaint    Chief Complaint  Patient presents with  . office visit    3 month F/U; Meds verbally reviewed with patient.    74 year old male with history of nonobstructive CAD by [facility] 05/2011 with 10 to 20% ostial LAD stenosis at that time, HFrEF secondary to NICM, pulmonary hypertension, NSVT, PE arrest in 07/2017, hyperkalemia on ACE inhibitor and spironolactone, PAD s/p right BKA s/p balloon angioplasty to the left peroneal artery, left posterior tibial artery, tibioperoneal trunk, and stenting of the distal left SFA in 07/2017, osteomyelitis of the left foot, DM2, hypertension, hyperlipidemia, anemia, tobacco abuse, and obesity, and who presents for follow-up.  Past Medical History    Past Medical History:  Diagnosis Date  . Abnormal posture   . CHF (congestive heart failure) (HCC)   . Chronic kidney disease    CKD   stage 2  . Complication of anesthesia    heart stopped x2 during toe amputation  . COPD (chronic obstructive pulmonary disease) (HCC)   . Coronary artery disease, non-occlusive    a. LHC 05/2011: 10-20% ostial LAD stenosis  . Depression   . Diabetes mellitus   . HLD (hyperlipidemia)   . Hypertension   . Muscle weakness   . Neuromuscular disorder (HCC)    neuropathy  . NICM (nonischemic cardiomyopathy) (HCC)    a. TTE 2/213: EF 50-55%, mild LVH, normal wall motion, Gr2DD, mildly dilated LA; b. TTE 2/13: EF 40-45%, mild concentric LVH, global HK, no evidence of LV thrombus; c. TTE 4/19: EF 40%, diffuse HK, Gr2DD, mild MR, mildly dilated LA, RV cavity size normal w/ normal RVSF, PASP 63; d. TTE 4/19: EF 25-30%, diffuse HK, mild LVH, mild MR, mild PASP; e. TTE 5/19: EF 20-25%, diffuse HK, moderate MR,  . NSVT (nonsustained ventricular tachycardia) (HCC)   . PAD (peripheral artery disease) (HCC)     a. s/p right BKA s/p  recent balloon angioplasty fo the left peroneal artery, left posterior tibial artery, tibioperoneal trunk, and stenting to the distal left SFA in 07/2017  . PEA (Pulseless electrical activity) (HCC)    heart stopped beating during surgery for amputation of toes   Past Surgical History:  Procedure Laterality Date  . AMPUTATION Right 07/02/2017   Procedure: RIGHT BELOW KNEE AMPUTATION;  Surgeon: Miguel Mustard, MD;  Location: [facility] OR;  Service: Orthopedics;  Laterality: Right;  . CARDIAC CATHETERIZATION    . CATARACT EXTRACTION W/PHACO Left 11/08/2018   Procedure: CATARACT EXTRACTION PHACO AND INTRAOCULAR LENS PLACEMENT (IOC)  LEFT VISION BLUE;  Surgeon: Miguel Cousin, MD;  Location: [facility] ORS;  Service: Ophthalmology;  Laterality: Left;  [place] 03:38.9 CDE 42.37 Fluid pack lot # [id number]  . cyst removal from  right leg about 6 years ago    . IRRIGATION AND DEBRIDEMENT FOOT Left 08/10/2017   Procedure: IRRIGATION AND DEBRIDEMENT FOOT;  Surgeon: Miguel Harvey, DPM;  Location: [facility] ORS;  Service: Podiatry;  Laterality: Left;  . LEFT HEART CATHETERIZATION WITH CORONARY ANGIOGRAM N/A 05/11/2011   Procedure: LEFT HEART CATHETERIZATION WITH CORONARY ANGIOGRAM;  Surgeon: Miguel Bihari, MD;  Location: [facility] CATH LAB;  Service: Cardiovascular;  Laterality: N/A;  . LOWER EXTREMITY ANGIOGRAPHY Left 08/13/2017   Procedure: Lower Extremity Angiography and PICC line placement;  Surgeon: Miguel Needy, MD;  Location: [facility] INVASIVE CV  LAB;  Service: Cardiovascular;  Laterality: Left;  . ruptured navel     repair of  . TOE AMPUTATION     not sure of when this surgery occured, but his heart stopped beating x 2 during surgery    Allergies  Allergies  Allergen Reactions  . Spironolactone     Hyperkalemia (currently takes spironolactone)    History of Present Illness    Miguel Harvey is a 74 y.o. male with PMH as above.  Echo from 05/08/2011 showed EF 50 to 55%.  Repeat echo 05/12/2011 showed EF 40  to 45%, mild concentric LVH, global HK.  Cath 05/2011 showed minimal CAD with 10 to 20% ostial LAD stenosis.  He was admitted to the hospital 06/2017 with sepsis from diabetic foot infection.  Echo 06/2017 showed EF 40%, diffuse HK, G2DD, mild MR, mildly dilated LA, RV cavity size normal, PASP 63 mmHg.  Repeat echo 06/2017 showed EF 25 to 30%, diffuse hypokinesis, mild LVH, mild MR, mild PASP.  No ischemic evaluation was performed at that time.  He was admitted to [facility] 07/2017 with left foot osteomyelitis.  While he was undergoing anesthesia induction, he suffered a PEA arrest, felt to be in the setting of decompensated heart failure.  Echo 08/06/2017 showed EF 20 to 25%, diffuse hypokinesis, moderate MR, mildly dilated LA/RA, mildly dilated RV with mildly reduced RV SF, PASP 68 mmHg.  He was IV diuresed with discharge weight 185 pounds.  Outpatient ischemic evaluation was advised to be considered.  He was not felt to be a good candidate for ICD given his comorbid conditions and overall poor prognosis.  Lexiscan 11/01/2016 was negative for ischemia or infarct with EF 30 to 44%.  It was ruled intermediate risk study based on his low EF.  When last seen at the office, he was reportedly doing well and tolerating all of his medications without issue.  Most recent EF showed pump function 50 to 55% and mild TR.  Since that time, the patient reports that he has been doing well from a cardiac standpoint. He reports today that "I don't know why I am here." He denies chest pain, palpitations, dyspnea, pnd, orthopnea, n, v, dizziness, syncope, edema, weight gain, or early satiety. A dry cough is noted today and he reports that only occurs with masking. SpO2 90% ORA with patient denying any difficulty breathing. HR 95bpm with patient denying racing HR or palpations with EKG showing PVCs. He does not feel dizzy despite BP 90/60. We discussed that he may be dehydrated given his elevated HR and low BP. He denies any recent sick contacts  or exposure to COVID-19. He is uncertain of which medications he takes and reports that he takes the medications prescribed for him. He states that "he takes what they give him." On review of the medication list, it appears as if he is still taking spironolactone and lisinopril, as well as potassium supplementation. It appears as if labs were requested to be performed by his facility with plan for updated labs to be faxed to confirm his potassium levels on these medications; however, this was not done.  No signs or symptoms consistent with bleeding. He is smoking 1 pack daily without desire to quit.   Home Medications    Prior to Admission medications   Medication Sig Start Date End Date Taking? Authorizing Provider  albuterol (ACCUNEB) 0.63 MG/3ML nebulizer solution Take 1 ampule by nebulization every 3 (three) hours as needed for wheezing.    [provider]  aspirin EC 81 MG EC tablet Take 1 tablet (81 mg total) by mouth daily. 08/14/17   Fritzi Mandes, MD  atorvastatin (LIPITOR) 20 MG tablet Take 20 mg by mouth every evening.     [provider]  carvedilol (COREG) 6.25 MG tablet Take 6.25 mg by mouth 2 (two) times daily with a meal.    [provider]  clopidogrel (PLAVIX) 75 MG tablet Take 1 tablet (75 mg total) by mouth daily. 08/14/17   Fritzi Mandes, MD  Dulaglutide (TRULICITY) 1.5 PT/4.6FK SOPN Inject 1.5 mg into the skin every Friday.    [provider]  furosemide (LASIX) 40 MG tablet Take 1 tablet (40 mg total) by mouth daily. 08/14/17   Fritzi Mandes, MD  guaiFENesin-dextromethorphan [facility] DM) 100-10 MG/5ML syrup Take 10 mLs by mouth every 4 (four) hours as needed for cough.    [provider]  insulin lispro (HUMALOG) 100 UNIT/ML KwikPen Inject 5 Units into the skin 3 (three) times daily. HOLD IF CBG < or = 150 04/05/19   [provider]  LANTUS SOLOSTAR 100 UNIT/ML Solostar Pen Inject 5 Units into the skin daily.  04/27/19   [provider]  linagliptin (TRADJENTA) 5 MG TABS tablet Take 5 mg by mouth daily.    [provider]  lisinopril (PRINIVIL,ZESTRIL) 10 MG tablet Take 1 tablet (10 mg total) by mouth daily. 10/22/17   Rise Mu, PA-C  Magnesium 400 MG TABS Take 400 mg by mouth 2 (two) times daily.     [provider]  metFORMIN (GLUCOPHAGE) 500 MG tablet Take 500 mg by mouth 2 (two) times daily with a meal.     [provider]  Multiple Vitamin (MULTIVITAMIN WITH MINERALS) TABS tablet Take 1 tablet by mouth daily.    [provider]  potassium chloride SA (K-DUR,KLOR-CON) 20 MEQ tablet Take 20 mEq by mouth every other day.     [provider]  Probiotic Product (ALIGN) 4 MG CAPS Take 4 mg by mouth daily.    [provider]  sertraline (ZOLOFT) 25 MG tablet Take 25 mg by mouth daily.  04/22/19   [provider]  sertraline (ZOLOFT) 50 MG tablet Take 50 mg by mouth daily.    [provider]  spironolactone (ALDACTONE) 25 MG tablet Take 25 mg by mouth daily.    [provider]  tamsulosin (FLOMAX) 0.4 MG CAPS capsule Take 1 capsule (0.4 mg total) by mouth daily. 07/12/17   Velvet Bathe, MD    Review of Systems    He denies chest pain, palpitations, dyspnea, pnd, orthopnea, n, v, dizziness, syncope, edema, weight gain, or early satiety. He reports chronic cough, attributed to his mask.   All other systems reviewed and are otherwise negative except as noted above.  Physical Exam    VS:  BP 90/60 (BP Location: Right Arm, Patient Position: Sitting, Cuff Size: Normal)   Pulse 95   Ht 5' 11.75" (1.822 m)   BMI 28.68 kg/m  , BMI Body mass index is 28.68 kg/m. GEN: Well nourished, well developed, in no acute distress. Coughing frequently, which he attributes to his mask. HEENT: normal. Neck: Supple, no JVD, carotid bruits, or masses. Cardiac: RRR with extrasystole appreciated, 1/6 systolic murmurs, rubs, or gallops. No clubbing,  cyanosis, R sided BKA and no edema, mild L sided LEE.  Radials 2+ and equal bilaterally. PT/DP 1+ on left side.  Respiratory:  Respirations regular and unlabored, clear to  auscultation bilaterally. GI: Soft, nontender, nondistended, BS + x 4. MS: no deformity or atrophy. S/p R sided BKA.  Skin: warm and dry, no rash. Neuro:  Strength and sensation are intact. Psych: Normal affect.  Accessory Clinical Findings    ECG personally reviewed by me today -NSR, 95 bpm, frequent PVCs, low voltage QRS- no acute changes.  VITALS Reviewed today   Temp Readings from Last 3 Encounters:  11/08/18 (!) 97.3 F (36.3 C) (Temporal)  08/14/17 97.6 F (36.4 C) (Oral)  07/11/17 97.9 F (36.6 C) (Oral)   BP Readings from Last 3 Encounters:  08/04/19 90/60  05/02/19 110/64  11/08/18 138/82   Pulse Readings from Last 3 Encounters:  08/04/19 95  05/02/19 81  11/08/18 87    Wt Readings from Last 3 Encounters:  05/02/19 210 lb (95.3 kg)  11/03/18 217 lb 15.9 oz (98.9 kg)  09/23/18 230 lb (104.3 kg)     LABS  reviewed today   CareEverwhere Labs present and most recent? Yes/No: No  Lab Results  Component Value Date   WBC 9.1 05/02/2019   HGB 11.3 (L) 05/02/2019   HCT 36.0 (L) 05/02/2019   MCV 87 05/02/2019   PLT 269 05/02/2019   Lab Results  Component Value Date   CREATININE 1.13 05/02/2019   BUN 20 05/02/2019   NA 142 05/02/2019   K 5.0 05/02/2019   CL 107 (H) 05/02/2019   CO2 21 05/02/2019   Lab Results  Component Value Date   ALT 30 08/05/2017   AST 25 08/05/2017   ALKPHOS 217 (H) 08/05/2017   BILITOT 0.8 08/05/2017   Lab Results  Component Value Date   CHOL 190 05/16/2012   HDL 39 (L) 05/16/2012   LDLCALC 133 (H) 05/16/2012   TRIG 69 08/12/2017   CHOLHDL 4.9 05/16/2012    Lab Results  Component Value Date   HGBA1C 9.5 (H) 08/05/2017   Lab Results  Component Value Date   TSH 4.840 (H) 10/14/2017     STUDIES/PROCEDURES reviewed today   Echo 05/25/19 1. Left  ventricular ejection fraction, by estimation, is 50 to 55%. The  left ventricle has low normal function. The left ventricle has no regional  wall motion abnormalities. There is moderate left ventricular hypertrophy.  Left ventricular diastolic  parameters are consistent with Grade I diastolic dysfunction (impaired  relaxation).  2. Right ventricular systolic function is normal. The right ventricular  size is normal. Tricuspid regurgitation signal is inadequate for assessing  PA pressure.   Assessment & Plan    HFrEF secondary to NICM --Relatively euvolemic and well compensated on exam.  Most recent echo as above with EF 50 to 55%, G1DD, mild TR, and no regional wall motion abnormalities.  Unable to obtain patient weight today per nursing staff and due to patient unable to stand.  He has a history of hyperkalemia and currently on potassium supplementation, lisinopril, and spironolactone.  He has not received follow-up labs to recheck his K+ on these medications.  We will thus update labs with a BMET today to evaluate his renal function and potassium with further recommendations regarding continuing his potassium supplementation, lisinopril, and spironolactone following these labs.  He is quite hypotensive and tachycardic today, which I suspect may in part be due to dehydration with recommendation to ensure he stays hydrated with total volume of fluid daily under 2L. Recommendations regarding lasix per BMET as above. Low-salt diet also encouraged.   CAD  History of PEA arrest --Denies anginal symptoms.  S/p LHC 05/2011 with most recent echo above. Will check CBC on Plavix and ASA. Continue Coreg as BP allows. Recommend recheck lipid and liver function and likely escalation of statin if indicated for risk factor modification and optimal lipid control. Recommendations regarding ACE and spironolactone pending BMET recheck given recent hyperkalemia. Also recommended is more optimal DM2 control. Consider  also treatment of abnormal TSH in the past and per PCP. Smoking cessation advised.   NSVT/PVCs --Asymptomatic.  Noted on EKG and extrasystole appreciated on cardiac exam.  Continue current dose carvedilol 6.25 mg twice daily.  Hypotension today precludes increasing the dose of his Coreg with heart rate slightly elevated today.  Suspect elevated heart rate due to dehydration. Also considered is abnormal TSH noted on previous labs and recommend recheck per PCP or at RTC.  No indication for antiarrhythmic therapy at this time.  As above in HPI, he has previously felt not to be a good candidate for ICD therapy given his comorbid conditions.  Hypertension --Hypotensive today.  Continue current medications as he is asx with low threshold to hold BB with further hypotension.  Update BMET with recommendations regarding continuation of lisinopril and spironolactone at that time.  PAD --No symptoms for worsening claudication. S/p R sided BKA.  Remains on DAPT.  Update a CBC today.  Followed by vascular surgery. Continue current statin and consider escalation of Lipitor dose at RTC and with updated lipid and liver function labs. Glycemic control recommended as below with most recent Hgb A1C poorly controlled.  Smoking cessation advised.   HLD --Continue ASA, Plavix, and statin. Update a CBC. Consider recheck of statin at RTC and escalation of therapy at that time given most recent labs as above.   Hyperkalemia -As above.  Update BMET today. Further recommendations regarding lisinopril, spironolactone, and potassium supplementation at that time.   MR, resolved? --Continue to monitor with periodic echoes. Most recent echo without any signs of MR though murmur still appreciated on exam.   Current Smoker --1 pack daily. No desire to quit smoking with smoking cessation encouraged.     DM2 --Per PCP. Most recent labs show A1C poorly controlled.   Abnormal TSH --As above, consider recheck at RTC or per PCP.  Most recent labs show TSH poorly controled. Consider as contributing to his PVCs.   Anemia --Anemia noted in past labs. Recheck a CBC as above and given he is on ASA and Plavix. Denies any s/sx of bleeding today.  Cough --Contacted his facility via phone regarding his cough and was able to confirm he tested negative for COVID-19 and has received the vaccine. Unclear etiology of cough. Euvolemic on exam. No temperature taken today. Given low saturations, recommendation that facility continue to monitor closely and low threshold to consider VQ scan (CTA less desirable given renal function unknown at this time) if continued cough. He denies any hematemesis or sputum with the cough. Also recommend monitor closely for any s/sx of illness. Consider also that his current smoking may be contributing. He reports today that he always has this cough and only when wearing the mask. Continue to monitor.   Disposition: RTC 3 months.  Lennon Alstrom, PA-C 08/04/2019

## 2019-08-04 NOTE — Patient Instructions (Signed)
Medication Instructions:   1. Your physician recommends that you continue on your current medications as directed. Please refer to the Current Medication list given to you today.  *If you need a refill on your cardiac medications before your next appointment, please call your pharmacy*   Lab Work:  1. Your physician recommends that you have lab work today(BMET/CBC)   If you have labs (blood work) drawn today and your tests are completely normal, you will receive your results only by: Marland Kitchen MyChart Message (if you have MyChart) OR . A paper copy in the mail If you have any lab test that is abnormal or we need to change your treatment, we will call you to review the results.   Testing/Procedures:  1. None Ordered   Follow-Up: At [facility], you and your health needs are our priority.  As part of our continuing mission to provide you with exceptional heart care, we have created designated Provider Care Teams.  These Care Teams include your primary Cardiologist (physician) and Advanced Practice Providers (APPs -  Physician Assistants and Nurse Practitioners) who all work together to provide you with the care you need, when you need it.  We recommend signing up for the patient portal called "MyChart".  Sign up information is provided on this After Visit Summary.  MyChart is used to connect with patients for Virtual Visits (Telemedicine).  Patients are able to view lab/test results, encounter notes, upcoming appointments, etc.  Non-urgent messages can be sent to your provider as well.   To learn more about what you can do with MyChart, go to [email or web address].    Your next appointment:   3 month(s)  The format for your next appointment:   In Person  Provider:    You may see Julien Nordmann, MD or one of the following Advanced Practice Providers on your designated Care Team:    Nicolasa Ducking, NP  Eula Listen, PA-C  Marisue Ivan, PA-C

## 2019-08-05 LAB — BASIC METABOLIC PANEL
BUN/Creatinine Ratio: 20 (ref 10–24)
BUN: 31 mg/dL — ABNORMAL HIGH (ref 8–27)
CO2: 21 mmol/L (ref 20–29)
Calcium: 9.4 mg/dL (ref 8.6–10.2)
Chloride: 105 mmol/L (ref 96–106)
Creatinine, Ser: 1.57 mg/dL — ABNORMAL HIGH (ref 0.76–1.27)
GFR calc Af Amer: 49 mL/min/{1.73_m2} — ABNORMAL LOW (ref >59)
GFR calc non Af Amer: 43 mL/min/{1.73_m2} — ABNORMAL LOW (ref >59)
Glucose: 115 mg/dL — ABNORMAL HIGH (ref 65–99)
Potassium: 6 mmol/L — ABNORMAL HIGH (ref 3.5–5.2)
Sodium: 137 mmol/L (ref 134–144)

## 2019-08-05 LAB — CBC
Hematocrit: 34 % — ABNORMAL LOW (ref 37.5–51.0)
Hemoglobin: 11.3 g/dL — ABNORMAL LOW (ref 13.0–17.7)
MCH: 28.2 pg (ref 26.6–33.0)
MCHC: 33.2 g/dL (ref 31.5–35.7)
MCV: 85 fL (ref 79–97)
Platelets: 309 10*3/uL (ref 150–450)
RBC: 4.01 x10E6/uL — ABNORMAL LOW (ref 4.14–5.80)
RDW: 13.4 % (ref 11.6–15.4)
WBC: 9.5 10*3/uL (ref 3.4–10.8)

## 2019-08-07 ENCOUNTER — Telehealth: Payer: Self-pay | Admitting: *Deleted

## 2019-08-07 NOTE — Telephone Encounter (Signed)
Patient is a resident as [employer], television/film set. S/w with nurse Crystal who verbalized understanding of the results and recommendations. She asked me to fax them to [phone]. She will have a STAT BMET drawn there.  She says they should receive the results by the end of the day.  Routing to Triage to follow up on getting results.

## 2019-08-07 NOTE — Telephone Encounter (Signed)
-----   Message from Lennon Alstrom, PA-C sent at 08/07/2019 10:51 AM EDT ----- Please let Miguel Harvey know that his potassium is very elevated at 6.0.   He should hold his spironolactone, potassium supplementation, and lisinopril.   In addition, his renal function showed a bump from previous labs; therefore, let's have him hold his lasix as well and increase hydration as it appears he may be a little dehydrated.   Let's have him come in for STAT labs to repeat a BMET. Further recommendations at that time, given we will need to get his potassium down if it remains elevated.  His CBC shows stable blood counts.

## 2019-08-08 NOTE — Telephone Encounter (Signed)
Called Miguel Harvey to make sure she received the fax. She has not. She gave me another fax number [phone]. Faxed again.  Will call her to confirm in a few minutes.

## 2019-08-08 NOTE — Telephone Encounter (Signed)
S/w Crystal again. She received the orders and lab results this time. STAT BMET to be drawn today. I will need to call back tomorrow to get the results. She is aware of the med changes as well.

## 2019-08-08 NOTE — Telephone Encounter (Signed)
Thank you for the updates. I will look for the updated labs tomorrow 08/09/19.

## 2019-08-08 NOTE — Telephone Encounter (Signed)
Called [facility] to see if labs available yet. S/w Crystal and she did not receive the fax yesterday. Fax transmission was successful yesterday, 08/07/19 at 12:15.  Refaxed orders just now. Will await confirmation and call Crystal back to make sure she received it.

## 2019-08-09 ENCOUNTER — Encounter: Payer: Self-pay | Admitting: Family

## 2019-08-09 NOTE — Telephone Encounter (Signed)
Closing encounter. Labs obtained

## 2019-08-29 ENCOUNTER — Telehealth: Payer: Self-pay | Admitting: *Deleted

## 2019-08-29 NOTE — Telephone Encounter (Signed)
Called and spoke with [place] at [employer]. Repeat BMET has not been drawn yet. She will place the order for it to be done sometime tomorrow. She noted to have them fax the results to [place].

## 2019-08-29 NOTE — Telephone Encounter (Signed)
-----   Message from Jeannetta Nap, RN sent at 08/10/2019 10:35 AM EDT ----- Regarding: BMET 2 week f/u BMET for patient At [facility]

## 2019-08-31 ENCOUNTER — Encounter: Payer: Self-pay | Admitting: Physician Assistant

## 2019-08-31 NOTE — Telephone Encounter (Signed)
Labs have been received and routed to provider for review.

## 2019-09-01 ENCOUNTER — Telehealth: Payer: Self-pay | Admitting: *Deleted

## 2019-09-01 NOTE — Telephone Encounter (Signed)
-----   Message from Parke Poisson, RN sent at 09/01/2019  3:02 PM EDT ----- Somehow this was sent to NL triage.  ----- Message ----- From: Leanord Hawking, RN Sent: 09/01/2019   8:21 AM EDT To: Cv Div Nl Triage

## 2019-09-01 NOTE — Telephone Encounter (Signed)
Creig Hines, NP  08/31/2019 6:04 PM EDT    Sodium 141, potassium 5.7, chloride 105, CO2 23, BUN 37, creatinine 1.48.  Potassium is elevated higher than last evaluation at 5.7. He is to remain off of spironolactone, lisinopril, and potassium chloride. Ideally, he will have a repeat basic metabolic panel on Friday, June 4 as his potassium may require treatment if it remains elevated persistently.   ----------------------------------------------------------------------------------------------------------------------------------------  Spoke with Rhae Hammock, RN at [employer]. She checked patient's meds and he has still been taking aldactone and lisinopril. No potassium noted on his list. She asked I fax the results and recommendations to [phone]. Results and notes faxed.  Discussed with Ward Givens, NP. Ok to get BMET on Monday, 09/04/19.

## 2019-09-04 ENCOUNTER — Telehealth: Payer: Self-pay | Admitting: *Deleted

## 2019-09-04 NOTE — Telephone Encounter (Signed)
Miguel Nap, RN  09/01/2019 3:58 PM EDT    Spoke with Rhae Hammock, RN at [employer]. She checked patient's meds and he has still been taking aldactone and lisinopril. No potassium noted on his list. She asked I fax the results and recommendations to [phone]. Results and notes faxed.  Discussed with Miguel Givens, NP. Ok to get BMET on Monday, 09/04/19.      Miguel Hines, NP  08/31/2019 6:04 PM EDT    Sodium 141, potassium 5.7, chloride 105, CO2 23, BUN 37, creatinine 1.48.  Potassium is elevated higher than last evaluation at 5.7. He is to remain off of spironolactone, lisinopril, and potassium chloride. Ideally, he will have a repeat basic metabolic panel on Friday, June 4 as his potassium may require treatment if it remains elevated persistently.      Miguel Nap, RN  08/31/2019 1:38 PM EDT    Routing to Miguel Givens, NP who reviewed patient's labs 2 weeks ago for advice.

## 2019-09-04 NOTE — Telephone Encounter (Signed)
Called [facility] to check to make sure the the fax for orders was received on Friday. S/w with nurse Crystal who said the meds (spironolactone, lisinopril and potassium) were discontinued. She is not sure if lab work was drawn on the patient or not.  Her supervisor is the one who handles the faxes and she is on break at this time. Crystal will find out so she can give me the answer when I call back. Crystal is there until 7pm tonight. I will try to call back before our office closes today.

## 2019-09-06 ENCOUNTER — Encounter: Payer: Self-pay | Admitting: Cardiovascular Disease

## 2019-09-06 NOTE — Telephone Encounter (Signed)
Spoke with Crystal, the nurse, and BMET has not been drawn yet. She spoke with her supervisor who will have BMET drawn stat this afternoon.  Will need to follow up on lab work later today or tomorrow to have the results faxed back to our office.

## 2019-09-06 NOTE — Telephone Encounter (Signed)
Attempted to reach Miguel Harvey at [employer]. Each time the phone was answered by the receptionist. However, once transferred it would ring many times until I finally had to hang up. Will try again at another time.

## 2019-09-07 ENCOUNTER — Telehealth: Payer: Self-pay | Admitting: Cardiovascular Disease

## 2019-09-07 NOTE — Telephone Encounter (Signed)
Made in Error

## 2019-09-07 NOTE — Telephone Encounter (Signed)
Labs from outside facility reviewed: Sodium 140, potassium 4.6, chloride 102, CO2 25, BUN 22.3, creatinine 1.2, glucose 194, calcium 9.3  Potassium improved and he should remain off of spironolactone, lisinopril, and potassium chloride.

## 2019-09-07 NOTE — Telephone Encounter (Signed)
Notes from Ward Givens, NP copied into a previously open encounter regarding the patient's lab work. Will close this encounter.

## 2019-09-07 NOTE — Telephone Encounter (Signed)
Creig Hines, NP at 09/07/2019 12:23 PM  Status: Signed    Labs from outside facility reviewed: Sodium 140, potassium 4.6, chloride 102, CO2 25, BUN 22.3, creatinine 1.2, glucose 194, calcium 9.3  Potassium improved and he should remain off of spironolactone, lisinopril, and potassium chloride.

## 2019-09-07 NOTE — Telephone Encounter (Signed)
I called and spoke with Maryellen Pile at [facility] to try to obtain the patient's BMP results from 09/06/19.  Shenita advised these had resulted and she would fax these.  Fax received.  I confirmed with Shenita if we needed to send any orders back on the patient, the fax # to send these to would be: [phone] (do not need to put to anyone's attention as this is the fax for the patient's unit per [facility]).   Will review results with Ward Givens, NP.  K+- 4.5/ BUN- 22.3/ creatinine- 1.20/ sodium- 140.

## 2019-09-08 ENCOUNTER — Encounter: Payer: Self-pay | Admitting: *Deleted

## 2019-09-08 NOTE — Telephone Encounter (Signed)
Letter done and faxed to [facility] @ [phone] with Ward Givens, NP's recommendations.  Confirmation received.

## 2019-11-07 ENCOUNTER — Encounter: Payer: Self-pay | Admitting: Physician Assistant

## 2019-11-07 ENCOUNTER — Other Ambulatory Visit: Payer: Self-pay

## 2019-11-07 ENCOUNTER — Ambulatory Visit (INDEPENDENT_AMBULATORY_CARE_PROVIDER_SITE_OTHER): Payer: Medicare Other | Admitting: Physician Assistant

## 2019-11-07 VITALS — BP 122/70 | HR 83 | Ht 72.0 in | Wt 215.0 lb

## 2019-11-07 DIAGNOSIS — I10 Essential (primary) hypertension: Secondary | ICD-10-CM

## 2019-11-07 DIAGNOSIS — F17200 Nicotine dependence, unspecified, uncomplicated: Secondary | ICD-10-CM | POA: Diagnosis not present

## 2019-11-07 DIAGNOSIS — Z79899 Other long term (current) drug therapy: Secondary | ICD-10-CM | POA: Diagnosis not present

## 2019-11-07 DIAGNOSIS — I4729 Other ventricular tachycardia: Secondary | ICD-10-CM

## 2019-11-07 DIAGNOSIS — I5022 Chronic systolic (congestive) heart failure: Secondary | ICD-10-CM

## 2019-11-07 DIAGNOSIS — I472 Ventricular tachycardia: Secondary | ICD-10-CM

## 2019-11-07 DIAGNOSIS — I739 Peripheral vascular disease, unspecified: Secondary | ICD-10-CM

## 2019-11-07 DIAGNOSIS — E875 Hyperkalemia: Secondary | ICD-10-CM

## 2019-11-07 DIAGNOSIS — E785 Hyperlipidemia, unspecified: Secondary | ICD-10-CM

## 2019-11-07 DIAGNOSIS — E119 Type 2 diabetes mellitus without complications: Secondary | ICD-10-CM

## 2019-11-07 DIAGNOSIS — R059 Cough, unspecified: Secondary | ICD-10-CM

## 2019-11-07 DIAGNOSIS — R05 Cough: Secondary | ICD-10-CM

## 2019-11-07 DIAGNOSIS — Z8639 Personal history of other endocrine, nutritional and metabolic disease: Secondary | ICD-10-CM

## 2019-11-07 NOTE — Progress Notes (Signed)
Office Visit    Patient Name: Miguel Harvey Date of Encounter: 11/07/2019  Primary Care Provider:  Patient, No Pcp Per Primary Cardiologist:  Miguel Nordmann, MD  Chief Complaint    Chief Complaint  Patient presents with  . other    3 month f/u no complaints today. Meds reviewed verbally with pt.    74 year old male with history of nonobstructive CAD by [facility] 05/2011 with 10 to 20% ostial LAD stenosis at that time, HFrEF secondary to NICM, pulmonary hypertension, NSVT, PE arrest in 07/2017, hyperkalemia on ACE inhibitor and spironolactone, PAD s/p right BKA s/p balloon angioplasty to the left peroneal artery, left posterior tibial artery, tibioperoneal trunk, and stenting of the distal left SFA in 07/2017, osteomyelitis of the left foot, DM2, hypertension, hyperlipidemia, anemia, tobacco abuse, and obesity, and who presents for 3 month follow-up.  Past Medical History    Past Medical History:  Diagnosis Date  . Abnormal posture   . CHF (congestive heart failure) (HCC)   . Chronic kidney disease    CKD   stage 2  . Complication of anesthesia    heart stopped x2 during toe amputation  . COPD (chronic obstructive pulmonary disease) (HCC)   . Coronary artery disease, non-occlusive    a. LHC 05/2011: 10-20% ostial LAD stenosis  . Depression   . Diabetes mellitus   . HLD (hyperlipidemia)   . Hypertension   . Muscle weakness   . Neuromuscular disorder (HCC)    neuropathy  . NICM (nonischemic cardiomyopathy) (HCC)    a. TTE 2/213: EF 50-55%, mild LVH, normal wall motion, Gr2DD, mildly dilated LA; b. TTE 2/13: EF 40-45%, mild concentric LVH, global HK, no evidence of LV thrombus; c. TTE 4/19: EF 40%, diffuse HK, Gr2DD, mild MR, mildly dilated LA, RV cavity size normal w/ normal RVSF, PASP 63; d. TTE 4/19: EF 25-30%, diffuse HK, mild LVH, mild MR, mild PASP; e. TTE 5/19: EF 20-25%, diffuse HK, moderate MR,  . NSVT (nonsustained ventricular tachycardia) (HCC)   . PAD (peripheral artery  disease) (HCC)    a. s/p right BKA s/p  recent balloon angioplasty fo the left peroneal artery, left posterior tibial artery, tibioperoneal trunk, and stenting to the distal left SFA in 07/2017  . PEA (Pulseless electrical activity) (HCC)    heart stopped beating during surgery for amputation of toes   Past Surgical History:  Procedure Laterality Date  . AMPUTATION Right 07/02/2017   Procedure: RIGHT BELOW KNEE AMPUTATION;  Surgeon: Miguel Mustard, MD;  Location: [facility] OR;  Service: Orthopedics;  Laterality: Right;  . CARDIAC CATHETERIZATION    . CATARACT EXTRACTION W/PHACO Left 11/08/2018   Procedure: CATARACT EXTRACTION PHACO AND INTRAOCULAR LENS PLACEMENT (IOC)  LEFT VISION BLUE;  Surgeon: Miguel Cousin, MD;  Location: [facility] ORS;  Service: Ophthalmology;  Laterality: Left;  [place] 03:38.9 CDE 42.37 Fluid pack lot # [id number]  . cyst removal from  right leg about 6 years ago    . IRRIGATION AND DEBRIDEMENT FOOT Left 08/10/2017   Procedure: IRRIGATION AND DEBRIDEMENT FOOT;  Surgeon: Miguel Harvey, DPM;  Location: [facility] ORS;  Service: Podiatry;  Laterality: Left;  . LEFT HEART CATHETERIZATION WITH CORONARY ANGIOGRAM N/A 05/11/2011   Procedure: LEFT HEART CATHETERIZATION WITH CORONARY ANGIOGRAM;  Surgeon: Miguel Bihari, MD;  Location: [facility] CATH LAB;  Service: Cardiovascular;  Laterality: N/A;  . LOWER EXTREMITY ANGIOGRAPHY Left 08/13/2017   Procedure: Lower Extremity Angiography and PICC line placement;  Surgeon: Miguel Needy, MD;  Location: [facility] INVASIVE CV LAB;  Service: Cardiovascular;  Laterality: Left;  . ruptured navel     repair of  . TOE AMPUTATION     not sure of when this surgery occured, but his heart stopped beating x 2 during surgery    Allergies  Allergies  Allergen Reactions  . Spironolactone     Hyperkalemia (currently takes spironolactone)    History of Present Illness    Miguel Harvey is a 74 y.o. male with PMH as above.  Echo from 05/08/2011 showed EF 50 to 55%.  Repeat echo  05/12/2011 showed EF 40 to 45%, mild concentric LVH, global HK.  Cath 05/2011 showed minimal CAD with 10 to 20% ostial LAD stenosis.  He was admitted to the hospital 06/2017 with sepsis from diabetic foot infection.  Echo 06/2017 showed EF 40%, diffuse HK, G2DD, mild MR, mildly dilated LA, RV cavity size normal, PASP 63 mmHg.  Repeat echo 06/2017 showed EF 25 to 30%, diffuse hypokinesis, mild LVH, mild MR, mild PASP.  No ischemic evaluation was performed at that time.  He was admitted to [facility] 07/2017 with left foot osteomyelitis.  While he was undergoing anesthesia induction, he suffered a PEA arrest, felt to be in the setting of decompensated heart failure.  Echo 08/06/2017 showed EF 20 to 25%, diffuse hypokinesis, moderate MR, mildly dilated LA/RA, mildly dilated RV with mildly reduced RV SF, PASP 68 mmHg.  He was IV diuresed with discharge weight 185 pounds.  Outpatient ischemic evaluation was advised to be considered.  He was not felt to be a good candidate for ICD given his comorbid conditions and overall poor prognosis.  Lexiscan 11/01/2016 was negative for ischemia or infarct with EF 30 to 44%.  It was ruled intermediate risk study based on his low EF.  When last seen at the office, he was reportedly doing well and tolerating all of his medications without issue.  Most recent EF showed pump function 50 to 55% and mild TR.  When last seen in office 07/2019, he reported he was doing well from a cardiac standpoint. EKG showed PVCs. He did not feel dizzy despite BP 90/60. It was thought that he may be dehydrated. On review of medications, he was still taking spironolactone and lisinopril, as well as potassium supplementation with known hyperkalemia. Labs were requested with ongoing hyperkalemia and spironolactone and lisinopril since discontinued. He was smoking 1 pack daily without desire to quit.   Today, he denies chest pain, palpitations, dyspnea, pnd, orthopnea, n, v, dizziness, syncope, edema, weight gain, or  early satiety. He has been completing PT for exercise. He does not drink very much water. He reports drinking diet sodas at night. He reports a cup of coffee or three per day. He states that the place he is staying at serves his sweets, such as brownies, as well as salty foods, such as pork chops. He is also eating potato chips at night. He is smoking cigarettes at 1 pack per day and without desire to quit. He reports restarting smoking when COVID-19 started. No s/sx of bleeding. He reports medication compliance.  Home Medications    Prior to Admission medications   Medication Sig Start Date End Date Taking? Authorizing Provider  albuterol (ACCUNEB) 0.63 MG/3ML nebulizer solution Take 1 ampule by nebulization every 3 (three) hours as needed for wheezing.    [provider]  aspirin EC 81 MG EC tablet Take 1 tablet (81 mg total) by mouth daily. 08/14/17   Allena Katz,  [place], MD  atorvastatin (LIPITOR) 20 MG tablet Take 20 mg by mouth every evening.     [provider]  carvedilol (COREG) 6.25 MG tablet Take 6.25 mg by mouth 2 (two) times daily with a meal.    [provider]  clopidogrel (PLAVIX) 75 MG tablet Take 1 tablet (75 mg total) by mouth daily. 08/14/17   Enedina Finner, MD  Dulaglutide (TRULICITY) 1.5 MG/0.5ML SOPN Inject 1.5 mg into the skin every Friday.    [provider]  furosemide (LASIX) 40 MG tablet Take 1 tablet (40 mg total) by mouth daily. 08/14/17   Enedina Finner, MD  guaiFENesin-dextromethorphan [facility] DM) 100-10 MG/5ML syrup Take 10 mLs by mouth every 4 (four) hours as needed for cough.    [provider]  insulin lispro (HUMALOG) 100 UNIT/ML KwikPen Inject 5 Units into the skin 3 (three) times daily. HOLD IF CBG < or = 150 04/05/19   [provider]  LANTUS SOLOSTAR 100 UNIT/ML Solostar Pen Inject 5 Units into the skin daily.  04/27/19   [provider]  linagliptin (TRADJENTA) 5 MG TABS tablet Take 5 mg by mouth daily.     [provider]  lisinopril (PRINIVIL,ZESTRIL) 10 MG tablet Take 1 tablet (10 mg total) by mouth daily. 10/22/17   Sondra Barges, PA-C  Magnesium 400 MG TABS Take 400 mg by mouth 2 (two) times daily.     [provider]  metFORMIN (GLUCOPHAGE) 500 MG tablet Take 500 mg by mouth 2 (two) times daily with a meal.     [provider]  Multiple Vitamin (MULTIVITAMIN WITH MINERALS) TABS tablet Take 1 tablet by mouth daily.    [provider]  potassium chloride SA (K-DUR,KLOR-CON) 20 MEQ tablet Take 20 mEq by mouth every other day.     [provider]  Probiotic Product (ALIGN) 4 MG CAPS Take 4 mg by mouth daily.    [provider]  sertraline (ZOLOFT) 25 MG tablet Take 25 mg by mouth daily.  04/22/19   [provider]  sertraline (ZOLOFT) 50 MG tablet Take 50 mg by mouth daily.    [provider]  spironolactone (ALDACTONE) 25 MG tablet Take 25 mg by mouth daily.    [provider]  tamsulosin (FLOMAX) 0.4 MG CAPS capsule Take 1 capsule (0.4 mg total) by mouth daily. 07/12/17   Penny Pia, MD    Review of Systems    He denies chest pain, palpitations, dyspnea, pnd, orthopnea, n, v, dizziness, syncope, edema, weight gain, or early satiety. He reports chronic cough.   All other systems reviewed and are otherwise negative except as noted above.  Physical Exam    VS:  BP 122/70 (BP Location: Right Arm, Patient Position: Sitting, Cuff Size: Normal)   Pulse 83   Ht 6' (1.829 m)   Wt 215 lb (97.5 kg)   SpO2 96%   BMI 29.16 kg/m  , BMI Body mass index is 29.16 kg/m. GEN: Well nourished, well developed, in no acute distress.  HEENT: normal. Neck: Supple, no JVD, carotid bruits, or masses. Cardiac: RRR with extrasystole appreciated, 2/6 systolic murmurs, rubs, or gallops. No clubbing, cyanosis, R sided BKA and no edema, mild L sided LEE.  Radials 2+ and equal bilaterally. PT/DP 1+ on left side.  Respiratory: Diffuse  wheezing and rhonchi appreciated throughout bilateral lung fields. GI: Soft, nontender, nondistended, BS + x 4. MS: no deformity or atrophy. S/p R sided BKA. No prosthesis in place  today. Skin: warm and dry, no rash. Neuro:  Strength and sensation are intact. Psych: Normal affect.  Accessory Clinical Findings    ECG personally reviewed by me today -NSR, 83  bpm, frequent PVCs though improved in frequency from previous tracings, low voltage QRS, poor R waves in inferior leads and V1-3- no acute changes.  VITALS Reviewed today   Temp Readings from Last 3 Encounters:  11/08/18 (!) 97.3 F (36.3 C) (Temporal)  08/14/17 97.6 F (36.4 C) (Oral)  07/11/17 97.9 F (36.6 C) (Oral)   BP Readings from Last 3 Encounters:  11/07/19 122/70  08/04/19 90/60  05/02/19 110/64   Pulse Readings from Last 3 Encounters:  11/07/19 83  08/04/19 95  05/02/19 81    Wt Readings from Last 3 Encounters:  11/07/19 215 lb (97.5 kg)  05/02/19 210 lb (95.3 kg)  11/03/18 217 lb 15.9 oz (98.9 kg)     LABS  reviewed today   Reviewed paperwork from [facility] / labs from the facility  Lab Results  Component Value Date   WBC 9.5 08/04/2019   HGB 11.3 (L) 08/04/2019   HCT 34.0 (L) 08/04/2019   MCV 85 08/04/2019   PLT 309 08/04/2019   Lab Results  Component Value Date   CREATININE 1.57 (H) 08/04/2019   BUN 31 (H) 08/04/2019   NA 137 08/04/2019   K 6.0 (H) 08/04/2019   CL 105 08/04/2019   CO2 21 08/04/2019   Lab Results  Component Value Date   ALT 30 08/05/2017   AST 25 08/05/2017   ALKPHOS 217 (H) 08/05/2017   BILITOT 0.8 08/05/2017   Lab Results  Component Value Date   CHOL 190 05/16/2012   HDL 39 (L) 05/16/2012   LDLCALC 133 (H) 05/16/2012   TRIG 69 08/12/2017   CHOLHDL 4.9 05/16/2012    Lab Results  Component Value Date   HGBA1C 9.5 (H) 08/05/2017   Lab Results  Component Value Date   TSH 4.840 (H) 10/14/2017     STUDIES/PROCEDURES reviewed today    Echo 05/25/19 1. Left ventricular ejection fraction, by estimation, is 50 to 55%. The  left ventricle has low normal function. The left ventricle has no regional  wall motion abnormalities. There is moderate left ventricular hypertrophy.  Left ventricular diastolic  parameters are consistent with Grade I diastolic dysfunction (impaired  relaxation).  2. Right ventricular systolic function is normal. The right ventricular  size is normal. Tricuspid regurgitation signal is inadequate for assessing  PA pressure.   Assessment & Plan    HFrEF secondary to NICM --Relatively euvolemic and well compensated on exam.  Most recent echo as above with EF 50 to 55%, G1DD, mild TR, and no regional wall motion abnormalities.  Of note, previous echo showed mitral regurgitation that is not noted on his echo as above. Given h/o hyperkalemia, no ACE/ARB/ARNI/KCL recommended.  Repeat BMET to reassess K and Cr.  CHF education. Low-salt diet and fluid restriction under 2L reviewed. Lifestyle changes, including diet and exercise reviewed.   CAD  History of PEA arrest --Denies anginal symptoms. S/p LHC 05/2011 with most recent echo above. Check CBC on Plavix and ASA. Continue Coreg. Optimal BP/LDL/DM2 control and dietary changes reviewed. Will recheck LDL today. Aggressive risk factor modification. Smoking cessation advised. Continue current medications.  NSVT/PVCs --Asymptomatic PVCs, noted on EKG with extrasystole appreciated on cardiac exam. Continue current dose carvedilol 6.25 mg twice daily and consider increasing if ongoing stable BP (history of hypotension in  the past). Recheck TSH.  No indication for antiarrhythmic therapy at this time.  As above in HPI, he has previously felt not to be a good candidate for ICD therapy given his comorbid conditions.  Hypertension --BP controlled. Unfortunately, I do not have consistent BP measurements recorded on the paperwork from [facility]. Recommend  at least once daily measurements and that he bring log with him at RTC to assist with titration of medications. As above, with stable or elevated pressures, would recommend increasing Coreg dose. Will defer for now given previous hypotension at his visits. Consider increase of Coreg if still room in BP at RTC. For now, continue current medications.  PAD --No symptoms for worsening claudication. S/p R sided BKA.  Remains on DAPT.  Update a CBC today.  Followed by vascular surgery. Continue current ASA and statin and consider escalation of Lipitor dose pending LDL labs today. Glycemic control recommended.  Smoking cessation advised.   HLD, goal LDL <70 --Continue ASA, Plavix, and statin. Update a CBC. LDL goal <70.   Hyperkalemia -Repeat BMET to confirm resolution since discontinued spironolactone and ACE.   Current Smoker --1 pack daily. No desire to quit smoking with smoking cessation encouraged.     DM2 --Glycemic control recommended. Dietary changes discussed. Monitoring and management per PCP.   Abnormal TSH --As above, will recheck TSH. Defer further monitoring and management to PCP.  Anemia --Anemia noted in past labs. Recheck a CBC as above and given he is on ASA and Plavix. Denies any s/sx of bleeding today.  Cough --Chronic and noted at previous visit. Coarse breath sounds with wheezing and rhonchi on exam. Continue to monitor and defer further workup to PCP. Smoking cessation advised.   Disposition: RTC 6 months.  Total time spent with patient today 45 minutes. This includes reviewing records, evaluating the patient, and coordinating care. Face-to-face time >50%.  Lennon Alstrom, PA-C 11/07/2019

## 2019-11-07 NOTE — Patient Instructions (Signed)
Medication Instructions:  Your physician recommends that you continue on your current medications as directed. Please refer to the Current Medication list given to you today.  *If you need a refill on your cardiac medications before your next appointment, please call your pharmacy*   Lab Work: Your physician recommends that you have lab work today(BMET, CBC, Tsh, lipid w direct)  If you have labs (blood work) drawn today and your tests are completely normal, you will receive your results only by: Marland Kitchen MyChart Message (if you have MyChart) OR . A paper copy in the mail If you have any lab test that is abnormal or we need to change your treatment, we will call you to review the results.   Testing/Procedures: None ordered   Follow-Up: At [facility], you and your health needs are our priority.  As part of our continuing mission to provide you with exceptional heart care, we have created designated Provider Care Teams.  These Care Teams include your primary Cardiologist (physician) and Advanced Practice Providers (APPs -  Physician Assistants and Nurse Practitioners) who all work together to provide you with the care you need, when you need it.  We recommend signing up for the patient portal called "MyChart".  Sign up information is provided on this After Visit Summary.  MyChart is used to connect with patients for Virtual Visits (Telemedicine).  Patients are able to view lab/test results, encounter notes, upcoming appointments, etc.  Non-urgent messages can be sent to your provider as well.   To learn more about what you can do with MyChart, go to [email or web address].    Your next appointment:   6 month(s)  The format for your next appointment:   In Person  Provider:    You may see Julien Nordmann, MD or Marisue Ivan, PA-C

## 2019-11-08 LAB — BASIC METABOLIC PANEL
BUN/Creatinine Ratio: 12 (ref 10–24)
BUN: 14 mg/dL (ref 8–27)
CO2: 23 mmol/L (ref 20–29)
Calcium: 9.1 mg/dL (ref 8.6–10.2)
Chloride: 105 mmol/L (ref 96–106)
Creatinine, Ser: 1.14 mg/dL (ref 0.76–1.27)
GFR calc Af Amer: 73 mL/min/{1.73_m2} (ref >59)
GFR calc non Af Amer: 63 mL/min/{1.73_m2} (ref >59)
Glucose: 106 mg/dL — ABNORMAL HIGH (ref 65–99)
Potassium: 4.3 mmol/L (ref 3.5–5.2)
Sodium: 141 mmol/L (ref 134–144)

## 2019-11-08 LAB — LIPID PANEL
Chol/HDL Ratio: 3.6 ratio (ref 0.0–5.0)
Cholesterol, Total: 96 mg/dL — ABNORMAL LOW (ref 100–199)
HDL: 27 mg/dL — ABNORMAL LOW (ref >39)
LDL Chol Calc (NIH): 54 mg/dL (ref 0–99)
Triglycerides: 69 mg/dL (ref 0–149)
VLDL Cholesterol Cal: 15 mg/dL (ref 5–40)

## 2019-11-08 LAB — CBC
Hematocrit: 35.7 % — ABNORMAL LOW (ref 37.5–51.0)
Hemoglobin: 11.6 g/dL — ABNORMAL LOW (ref 13.0–17.7)
MCH: 28.8 pg (ref 26.6–33.0)
MCHC: 32.5 g/dL (ref 31.5–35.7)
MCV: 89 fL (ref 79–97)
Platelets: 288 10*3/uL (ref 150–450)
RBC: 4.03 x10E6/uL — ABNORMAL LOW (ref 4.14–5.80)
RDW: 13.6 % (ref 11.6–15.4)
WBC: 11.2 10*3/uL — ABNORMAL HIGH (ref 3.4–10.8)

## 2019-11-08 LAB — LDL CHOLESTEROL, DIRECT: LDL Direct: 53 mg/dL (ref 0–99)

## 2019-11-08 LAB — TSH: TSH: 2.03 u[IU]/mL (ref 0.450–4.500)

## 2019-11-09 ENCOUNTER — Telehealth: Payer: Self-pay

## 2019-11-09 NOTE — Telephone Encounter (Signed)
-----   Message from Lennon Alstrom, PA-C sent at 11/08/2019  7:53 PM EDT ----- Please let Miguel Harvey know that his kidney function has improved from 3 months ago. His potassium is no longer elevated as it was 3 months ago and now at normal range.  His blood counts are stable, though his white blood cell count is elevated, which could be due to fighting off an infection. His cholesterol is well controlled with LDL at 53 (goal LDL <70). His TSH is no longer elevated and now returned to normal range. Overall, very good results. I recommend he speak with his PCP if his cough that he had during our visit worsens or if he develops any new cold / flu / infection symptoms.

## 2019-11-09 NOTE — Telephone Encounter (Signed)
Call to patient to review labs.    Spoke to [employer] from patients living facility, she verbalized understanding and has no further questions at this time.    Advised pt to call for any further questions or concerns.  No further orders.   Confirmed Feb appt.

## 2020-05-11 NOTE — Progress Notes (Signed)
Cardiology Office Note  Date:  05/13/2020   ID:  Miguel Harvey, [place] Oct 20, 1945, MRN [id number]  PCP:  Patient, No Pcp Per   Chief Complaint  Patient presents with  . Follow-up    6 month F/U-Patient is not familiar with the names of the medication he takes.     HPI:  Miguel Harvey is a 75 y.o. male with a hx of  Diabetes type 2 PAD s/p right BKA s/p balloon angioplasty to the left peroneal artery, left posterior tibial artery, tibioperoneal trunk, and stenting of the distal left SFA in 07/2017 Nonobstructive coronary artery disease,  cardiomyopathy ejection fraction 25% on echocardiogram,  smoker hyperlipidemia, diabetes, hypertension, presenting to the hospital with diabetic foot ulcer/infection, Has prior below-knee amputation right lower extremity one month ago in [place] Long-standing ulceration left foot with no healing Does not ambulate much at baseline Started on Zosyn and vancomycin, Was being prepped for debridement left foot with Dr. Ether Griffins PEA arrest EF in 05/2019 up to normal 50 to 55% Who presents for follow-up of his cardiomyopathy  Last seen by myself in the hospital May 2019 Seen by one of our providers August 2021  Recent studies reviewed in detail Echocardiogram May 25, 2019 Ejection fraction 50 to 55%  Lives at [facility]  amputation right BKA Prosthesis , walks ok with walker  HGB A1C: ? Records have been requested  EKG personally reviewed by myself on todays visit  NSR rate 94 bpm, PVCs  Continues to smoke,  4-6 a day   PMH:   has a past medical history of Abnormal posture, CHF (congestive heart failure) (HCC), Chronic kidney disease, Complication of anesthesia, COPD (chronic obstructive pulmonary disease) (HCC), Coronary artery disease, non-occlusive, Depression, Diabetes mellitus, HLD (hyperlipidemia), Hypertension, Muscle weakness, Neuromuscular disorder (HCC), NICM (nonischemic cardiomyopathy) (HCC), NSVT (nonsustained ventricular  tachycardia) (HCC), PAD (peripheral artery disease) (HCC), and PEA (Pulseless electrical activity) (HCC).  PSH:    Past Surgical History:  Procedure Laterality Date  . AMPUTATION Right 07/02/2017   Procedure: RIGHT BELOW KNEE AMPUTATION;  Surgeon: Nadara Mustard, MD;  Location: [facility] OR;  Service: Orthopedics;  Laterality: Right;  . CARDIAC CATHETERIZATION    . CATARACT EXTRACTION W/PHACO Left 11/08/2018   Procedure: CATARACT EXTRACTION PHACO AND INTRAOCULAR LENS PLACEMENT (IOC)  LEFT VISION BLUE;  Surgeon: Elliot Cousin, MD;  Location: [facility] ORS;  Service: Ophthalmology;  Laterality: Left;  [place] 03:38.9 CDE 42.37 Fluid pack lot # [id number]  . cyst removal from  right leg about 6 years ago    . IRRIGATION AND DEBRIDEMENT FOOT Left 08/10/2017   Procedure: IRRIGATION AND DEBRIDEMENT FOOT;  Surgeon: Gwyneth Revels, DPM;  Location: [facility] ORS;  Service: Podiatry;  Laterality: Left;  . LEFT HEART CATHETERIZATION WITH CORONARY ANGIOGRAM N/A 05/11/2011   Procedure: LEFT HEART CATHETERIZATION WITH CORONARY ANGIOGRAM;  Surgeon: Lennette Bihari, MD;  Location: [facility] CATH LAB;  Service: Cardiovascular;  Laterality: N/A;  . LOWER EXTREMITY ANGIOGRAPHY Left 08/13/2017   Procedure: Lower Extremity Angiography and PICC line placement;  Surgeon: Annice Needy, MD;  Location: [facility] INVASIVE CV LAB;  Service: Cardiovascular;  Laterality: Left;  . ruptured navel     repair of  . TOE AMPUTATION     not sure of when this surgery occured, but his heart stopped beating x 2 during surgery    Current Outpatient Medications  Medication Sig Dispense Refill  . aspirin EC 81 MG EC tablet Take 1 tablet (81 mg total) by mouth daily. 30  tablet 0  . atorvastatin (LIPITOR) 20 MG tablet Take 20 mg by mouth every evening.     . carvedilol (COREG) 6.25 MG tablet Take 6.25 mg by mouth 2 (two) times daily with a meal.    . ciclopirox (LOPROX) 0.77 % cream Apply 1 application topically daily.    . clopidogrel (PLAVIX) 75 MG tablet Take 1 tablet  (75 mg total) by mouth daily. 30 tablet 0  . Dulaglutide (TRULICITY) 1.5 MG/0.5ML SOPN Inject 1.5 mg into the skin every Friday.    . furosemide (LASIX) 40 MG tablet Take 1 tablet (40 mg total) by mouth daily. 30 tablet 0  . insulin lispro (HUMALOG) 100 UNIT/ML KwikPen Inject 5 Units into the skin 3 (three) times daily. HOLD IF CBG < or = 150    . LANTUS SOLOSTAR 100 UNIT/ML Solostar Pen Inject 5 Units into the skin daily.     Marland Kitchen linagliptin (TRADJENTA) 5 MG TABS tablet Take 5 mg by mouth daily.    . Magnesium 400 MG TABS Take 400 mg by mouth 2 (two) times daily.     . metFORMIN (GLUCOPHAGE) 500 MG tablet Take 500 mg by mouth 2 (two) times daily with a meal.     . Multiple Vitamin (MULTIVITAMIN WITH MINERALS) TABS tablet Take 1 tablet by mouth daily.    . Probiotic Product (ALIGN) 4 MG CAPS Take 4 mg by mouth daily.    . sertraline (ZOLOFT) 25 MG tablet Take 25 mg by mouth daily.     . sertraline (ZOLOFT) 50 MG tablet Take 50 mg by mouth daily.    . tamsulosin (FLOMAX) 0.4 MG CAPS capsule Take 1 capsule (0.4 mg total) by mouth daily. 30 capsule 0   No current facility-administered medications for this visit.    Allergies:   Spironolactone   Social History:  The patient  reports that he has been smoking cigarettes. He has been smoking about 0.00 packs per day for the past 40.00 years. He quit smokeless tobacco use about 9 years ago. He reports previous alcohol use of about 6.0 standard drinks of alcohol per week. He reports that he does not use drugs.   Family History:   family history includes CAD in an other family member; Diabetes in an other family member.    Review of Systems: Review of Systems  Constitutional: Negative.   HENT: Negative.   Respiratory: Negative.   Cardiovascular: Negative.   Gastrointestinal: Negative.   Musculoskeletal: Negative.   Neurological: Negative.   Psychiatric/Behavioral: Negative.   All other systems reviewed and are negative.    PHYSICAL  EXAM: VS:  BP 110/72 (BP Location: Right Arm, Patient Position: Sitting, Cuff Size: Large)   Pulse 94   Ht 6' (1.829 m)   Wt 210 lb (95.3 kg)   SpO2 98%   BMI 28.48 kg/m  , BMI Body mass index is 28.48 kg/m. GEN: Well nourished, well developed, in no acute distress HEENT: normal Neck: no JVD, carotid bruits, or masses Cardiac: RRR; no murmurs, rubs, or gallops,no edema  Respiratory:  clear to auscultation bilaterally, normal work of breathing GI: soft, nontender, nondistended, + BS MS: no deformity or atrophy Amputation below the knee on right Skin: warm and dry, no rash Neuro:  Strength and sensation are intact Psych: euthymic mood, full affect   Recent Labs: 11/07/2019: BUN 14; Creatinine, Ser 1.14; Hemoglobin 11.6; Platelets 288; Potassium 4.3; Sodium 141; TSH 2.030    Lipid Panel Lab Results  Component Value Date  CHOL 96 (L) 11/07/2019   HDL 27 (L) 11/07/2019   LDLCALC 54 11/07/2019   TRIG 69 11/07/2019      Wt Readings from Last 3 Encounters:  05/13/20 210 lb (95.3 kg)  11/07/19 215 lb (97.5 kg)  05/02/19 210 lb (95.3 kg)       ASSESSMENT AND PLAN:  Problem List Items Addressed This Visit      Cardiology Problems   PAD (peripheral artery disease) (HCC)     Other   Diabetes mellitus (HCC)    Other Visit Diagnoses    Chronic systolic heart failure (HCC)    -  Primary   Essential hypertension       NSVT (nonsustained ventricular tachycardia) (HCC)       Current smoker          PAD Needs f/u with Dr. Wyn Quaker, Denies claudication, Amputation righ BKA, has a prosthesis  HTN Blood pressure is well controlled on today's visit. No changes made to the medications.  hyperlipiedmia Cholesterol is at goal on the current lipid regimen. No changes to the medications were made.  Diabetes: Uncertain HGA1C, followed at [facility]  NSVT No palpitations    Total encounter time more than 25 minutes  Greater than 50% was spent in counseling and  coordination of care with the patient    Signed, Dossie Arbour, M.D., Ph.D. [facility] [place], [place] [phone]

## 2020-05-13 ENCOUNTER — Ambulatory Visit (INDEPENDENT_AMBULATORY_CARE_PROVIDER_SITE_OTHER): Payer: Medicare Other | Admitting: Cardiovascular Disease

## 2020-05-13 ENCOUNTER — Encounter: Payer: Self-pay | Admitting: Cardiovascular Disease

## 2020-05-13 ENCOUNTER — Other Ambulatory Visit: Payer: Self-pay

## 2020-05-13 VITALS — BP 110/72 | HR 94 | Ht 72.0 in | Wt 210.0 lb

## 2020-05-13 DIAGNOSIS — I4729 Other ventricular tachycardia: Secondary | ICD-10-CM

## 2020-05-13 DIAGNOSIS — I472 Ventricular tachycardia: Secondary | ICD-10-CM | POA: Diagnosis not present

## 2020-05-13 DIAGNOSIS — I5022 Chronic systolic (congestive) heart failure: Secondary | ICD-10-CM

## 2020-05-13 DIAGNOSIS — E119 Type 2 diabetes mellitus without complications: Secondary | ICD-10-CM

## 2020-05-13 DIAGNOSIS — I10 Essential (primary) hypertension: Secondary | ICD-10-CM | POA: Diagnosis not present

## 2020-05-13 DIAGNOSIS — I739 Peripheral vascular disease, unspecified: Secondary | ICD-10-CM

## 2020-05-13 DIAGNOSIS — F17200 Nicotine dependence, unspecified, uncomplicated: Secondary | ICD-10-CM | POA: Diagnosis not present

## 2020-05-13 NOTE — Patient Instructions (Signed)
Medication Instructions:  No changes  If you need a refill on your cardiac medications before your next appointment, please call your pharmacy.    Lab work: No new labs needed   If you have labs (blood work) drawn today and your tests are completely normal, you will receive your results only by: . MyChart Message (if you have MyChart) OR . A paper copy in the mail If you have any lab test that is abnormal or we need to change your treatment, we will call you to review the results.   Testing/Procedures: No new testing needed   Follow-Up: At [facility], you and your health needs are our priority.  As part of our continuing mission to provide you with exceptional heart care, we have created designated Provider Care Teams.  These Care Teams include your primary Cardiologist (physician) and Advanced Practice Providers (APPs -  Physician Assistants and Nurse Practitioners) who all work together to provide you with the care you need, when you need it.  . You will need a follow up appointment in 12 months  . Providers on your designated Care Team:   . Christopher Berge, NP . Ryan Dunn, PA-C . Jacquelyn Visser, PA-C  Any Other Special Instructions Will Be Listed Below (If Applicable).  COVID-19 Vaccine Information can be found at: [email or web address] For questions related to vaccine distribution or appointments, please email [email or web address] or call [phone].     

## 2020-05-14 NOTE — Progress Notes (Signed)
Attempted to call the patient ; no answer ; lvm for facility to callback to schedule

## 2020-05-15 NOTE — Addendum Note (Signed)
Addended by: Theola Sequin on: 05/15/2020 05:10 PM   Modules accepted: Orders

## 2020-07-08 ENCOUNTER — Encounter: Payer: Medicare Other | Attending: Physician Assistant | Admitting: Physician Assistant

## 2020-07-08 ENCOUNTER — Other Ambulatory Visit (INDEPENDENT_AMBULATORY_CARE_PROVIDER_SITE_OTHER): Payer: Self-pay | Admitting: Physician Assistant

## 2020-07-08 ENCOUNTER — Other Ambulatory Visit: Payer: Self-pay

## 2020-07-08 DIAGNOSIS — Z7902 Long term (current) use of antithrombotics/antiplatelets: Secondary | ICD-10-CM | POA: Diagnosis not present

## 2020-07-08 DIAGNOSIS — L97422 Non-pressure chronic ulcer of left heel and midfoot with fat layer exposed: Secondary | ICD-10-CM | POA: Insufficient documentation

## 2020-07-08 DIAGNOSIS — S91302A Unspecified open wound, left foot, initial encounter: Secondary | ICD-10-CM

## 2020-07-08 DIAGNOSIS — E1151 Type 2 diabetes mellitus with diabetic peripheral angiopathy without gangrene: Secondary | ICD-10-CM | POA: Diagnosis not present

## 2020-07-08 DIAGNOSIS — I2510 Atherosclerotic heart disease of native coronary artery without angina pectoris: Secondary | ICD-10-CM | POA: Insufficient documentation

## 2020-07-08 DIAGNOSIS — E11621 Type 2 diabetes mellitus with foot ulcer: Secondary | ICD-10-CM | POA: Insufficient documentation

## 2020-07-08 DIAGNOSIS — L97429 Non-pressure chronic ulcer of left heel and midfoot with unspecified severity: Secondary | ICD-10-CM | POA: Diagnosis present

## 2020-07-10 ENCOUNTER — Other Ambulatory Visit: Payer: Self-pay

## 2020-07-10 ENCOUNTER — Ambulatory Visit (INDEPENDENT_AMBULATORY_CARE_PROVIDER_SITE_OTHER): Payer: Medicare Other

## 2020-07-10 DIAGNOSIS — I70269 Atherosclerosis of native arteries of extremities with gangrene, unspecified extremity: Secondary | ICD-10-CM | POA: Diagnosis not present

## 2020-07-10 DIAGNOSIS — S91302A Unspecified open wound, left foot, initial encounter: Secondary | ICD-10-CM | POA: Diagnosis not present

## 2020-07-15 ENCOUNTER — Other Ambulatory Visit: Payer: Self-pay

## 2020-07-15 ENCOUNTER — Encounter: Payer: Medicare Other | Admitting: Physician Assistant

## 2020-07-15 DIAGNOSIS — E11621 Type 2 diabetes mellitus with foot ulcer: Secondary | ICD-10-CM | POA: Diagnosis not present

## 2020-07-15 NOTE — Progress Notes (Addendum)
Miguel Harvey, Miguel Harvey ([id number]) Visit Report for 07/15/2020 Chief Complaint Document Details Patient Name: Miguel Harvey, Miguel Harvey Date of Service: 07/15/2020 12:45 PM Medical Record Number: [id number] Patient Account Number: [id number] Date of Birth/Sex: 1945-10-23 (75 y.o. M) Treating RN: Yevonne Pax Primary Care Provider: [name] Other Clinician: Lolita Cram Referring Provider: [name] Treating Provider/Extender: Rowan Blase in Treatment: 1 Information Obtained from: Patient Chief Complaint Left heel ulcer Electronic Signature(s) Signed: 07/15/2020 12:57:43 PM By: Lenda Kelp PA-C Entered By: Lenda Kelp on 07/15/2020 12:57:43 Miguel Harvey ([id number]) -------------------------------------------------------------------------------- HPI Details Patient Name: Miguel Harvey Date of Service: 07/15/2020 12:45 PM Medical Record Number: [id number] Patient Account Number: [id number] Date of Birth/Sex: 1946-02-08 (75 y.o. M) Treating RN: Yevonne Pax Primary Care Provider: [name] Other Clinician: Lolita Cram Referring Provider: [name] Treating Provider/Extender: Rowan Blase in Treatment: 1 History of Present Illness HPI Description: 07/08/20 patient presents today for initial inspection here in our clinic concerning issues that he is been having with his left heel for about a month he tells me. To be honest the wound looks like it is probably been there longer than a month but I cannot be 100% sure of that. Fortunately there does not appear to be any signs of infection. We did perform arterial studies today and that was also somewhat low as far as the reading was concerned at 0.63. He does have a known history of peripheral vascular disease. He has diabetes mellitus type 2, hypertension, coronary artery disease, peripheral vascular disease, and is on long-term anticoagulant therapy. Specifically Plavix. Right now the wound does appear to be showing some  signs of healing which is good news. Nonetheless given his poor arterial flow I do want him to see [facility] for a formal arterial study with ABI and TBI to further evaluate what is going on here. 07/15/2020 upon evaluation today patient appears to be doing excellent in regard to his heel ulcer. He was seen by vascular. Unfortunately we do not have the report documented yet that I can review though the patient tells me that the provider said that he had good blood flow. with that being said right now he seems to be doing excellent and is making great progress. Overall I think the wound is headed in the right direction. Electronic Signature(s) Signed: 07/15/2020 1:19:02 PM By: Lenda Kelp PA-C Entered By: Lenda Kelp on 07/15/2020 13:19:02 Miguel Harvey, Miguel Harvey ([id number]) -------------------------------------------------------------------------------- Physical Exam Details Patient Name: Miguel Harvey Date of Service: 07/15/2020 12:45 PM Medical Record Number: [id number] Patient Account Number: [id number] Date of Birth/Sex: [birth year] (75 y.o. M) Treating RN: Yevonne Pax Primary Care Provider: [name] Other Clinician: Lolita Cram Referring Provider: [name] Treating Provider/Extender: Allen Derry Weeks in Treatment: 1 Constitutional Well-nourished and well-hydrated in no acute distress. Respiratory normal breathing without difficulty. Psychiatric this patient is able to make decisions and demonstrates good insight into disease process. Alert and Oriented x 3. pleasant and cooperative. Notes Patient's wound bed shows signs of good granulation epithelization at this point. No sharp debridement was necessary which is great news and overall very pleased with where things stand today. Electronic Signature(s) Signed: 07/15/2020 1:19:24 PM By: Lenda Kelp PA-C Entered By: Lenda Kelp on 07/15/2020 13:19:24 Miguel Harvey, Miguel Harvey  ([id number]) -------------------------------------------------------------------------------- Physician Orders Details Patient Name: Miguel Harvey Date of Service: 07/15/2020 12:45 PM Medical Record Number: [id number] Patient Account Number: [id number] Date of Birth/Sex: [birth year] (75 y.o. M) Treating RN:  Yevonne Pax Primary Care Provider: [name] Other Clinician: Lolita Cram Referring Provider: [name] Treating Provider/Extender: Rowan Blase in Treatment: 1 Verbal / Phone Orders: No Diagnosis Coding ICD-10 Coding Code Description E11.621 Type 2 diabetes mellitus with foot ulcer L97.422 Non-pressure chronic ulcer of left heel and midfoot with fat layer exposed I10 Essential (primary) hypertension I25.10 Atherosclerotic heart disease of native coronary artery without angina pectoris I73.89 Other specified peripheral vascular diseases Z79.01 Long term (current) use of anticoagulants Follow-up Appointments o Return Appointment in 1 week. Edema Control - Lymphedema / Segmental Compressive Device / Other o Elevate, Exercise Daily and Avoid Standing for Long Periods of Time. o Elevate legs to the level of the heart and pump ankles as often as possible o Elevate leg(s) parallel to the floor when sitting. Wound Treatment Wound #1 - Calcaneus Wound Laterality: Left, Midline Cleanser: Normal Saline (Generic) 3 x Per Week/30 Days Discharge Instructions: Wash your hands with soap and water. Remove old dressing, discard into plastic bag and place into trash. Cleanse the wound with Normal Saline prior to applying a clean dressing using gauze sponges, not tissues or cotton balls. Do not scrub or use excessive force. Pat dry using gauze sponges, not tissue or cotton balls. Primary Dressing: Prisma 4.34 (in) (Generic) 3 x Per Week/30 Days Discharge Instructions: Moisten w/normal saline or sterile water; Cover wound as directed. Do not remove from wound bed. Secondary  Dressing: Mepilex Border Flex, 4x4 (in/in) (Generic) 3 x Per Week/30 Days Discharge Instructions: Apply to wound as directed. Do not cut. Electronic Signature(s) Signed: 07/15/2020 5:07:44 PM By: Lenda Kelp PA-C Signed: 07/16/2020 7:54:04 AM By: Yevonne Pax RN Entered By: Yevonne Pax on 07/15/2020 13:13:49 Miguel Harvey, Miguel Harvey ([id number]) -------------------------------------------------------------------------------- Problem List Details Patient Name: Miguel Harvey Date of Service: 07/15/2020 12:45 PM Medical Record Number: [id number] Patient Account Number: [id number] Date of Birth/Sex: [birth year] (75 y.o. M) Treating RN: Yevonne Pax Primary Care Provider: [name] Other Clinician: Lolita Cram Referring Provider: [name] Treating Provider/Extender: Allen Derry Weeks in Treatment: 1 Active Problems ICD-10 Encounter Code Description Active Date MDM Diagnosis E11.621 Type 2 diabetes mellitus with foot ulcer 07/08/2020 No Yes L97.422 Non-pressure chronic ulcer of left heel and midfoot with fat layer 07/08/2020 No Yes exposed I10 Essential (primary) hypertension 07/08/2020 No Yes I25.10 Atherosclerotic heart disease of native coronary artery without angina 07/08/2020 No Yes pectoris I73.89 Other specified peripheral vascular diseases 07/08/2020 No Yes Z79.01 Long term (current) use of anticoagulants 07/08/2020 No Yes Inactive Problems Resolved Problems Electronic Signature(s) Signed: 07/15/2020 12:57:36 PM By: Lenda Kelp PA-C Entered By: Lenda Kelp on 07/15/2020 12:57:36 Miguel Harvey ([id number]) -------------------------------------------------------------------------------- Progress Note Details Patient Name: Miguel Harvey Date of Service: 07/15/2020 12:45 PM Medical Record Number: [id number] Patient Account Number: [id number] Date of Birth/Sex: [birth year] (75 y.o. M) Treating RN: Yevonne Pax Primary Care Provider: [name] Other Clinician: Lolita Cram Referring Provider: [name] Treating Provider/Extender: Rowan Blase in Treatment: 1 Subjective Chief Complaint Information obtained from Patient Left heel ulcer History of Present Illness (HPI) 07/08/20 patient presents today for initial inspection here in our clinic concerning issues that he is been having with his left heel for about a month he tells me. To be honest the wound looks like it is probably been there longer than a month but I cannot be 100% sure of that. Fortunately there does not appear to be any signs of infection. We did perform arterial studies today and that was  also somewhat low as far as the reading was concerned at 0.63. He does have a known history of peripheral vascular disease. He has diabetes mellitus type 2, hypertension, coronary artery disease, peripheral vascular disease, and is on long-term anticoagulant therapy. Specifically Plavix. Right now the wound does appear to be showing some signs of healing which is good news. Nonetheless given his poor arterial flow I do want him to see [facility] for a formal arterial study with ABI and TBI to further evaluate what is going on here. 07/15/2020 upon evaluation today patient appears to be doing excellent in regard to his heel ulcer. He was seen by vascular. Unfortunately we do not have the report documented yet that I can review though the patient tells me that the provider said that he had good blood flow. with that being said right now he seems to be doing excellent and is making great progress. Overall I think the wound is headed in the right direction. Objective Constitutional Well-nourished and well-hydrated in no acute distress. Vitals Time Taken: 12:54 PM, Height: 72 in, Weight: 220 lbs, BMI: 29.8, Temperature: 98 F, Pulse: 88 bpm, Respiratory Rate: 18 breaths/min, Blood Pressure: 122/77 mmHg. Respiratory normal breathing without difficulty. Psychiatric this patient is able to  make decisions and demonstrates good insight into disease process. Alert and Oriented x 3. pleasant and cooperative. General Notes: Patient's wound bed shows signs of good granulation epithelization at this point. No sharp debridement was necessary which is great news and overall very pleased with where things stand today. Integumentary (Hair, Skin) Wound #1 status is Open. Original cause of wound was Trauma. The date acquired was: 06/07/2020. The wound has been in treatment 1 weeks. The wound is located on the Left,Midline Calcaneus. The wound measures 1.7cm length x 1.2cm width x 0.2cm depth; 1.602cm^2 area and 0.32cm^3 volume. There is Fat Layer (Subcutaneous Tissue) exposed. There is no tunneling or undermining noted. There is a medium amount of serosanguineous drainage noted. The wound margin is distinct with the outline attached to the wound base. There is medium (34-66%) pink granulation within the wound bed. There is a medium (34-66%) amount of necrotic tissue within the wound bed including Adherent Slough. Assessment Active Problems ICD-10 Type 2 diabetes mellitus with foot ulcer Non-pressure chronic ulcer of left heel and midfoot with fat layer exposed Essential (primary) hypertension Miguel Harvey, Miguel Harvey ([id number]) Atherosclerotic heart disease of native coronary artery without angina pectoris Other specified peripheral vascular diseases Long term (current) use of anticoagulants Plan Follow-up Appointments: Return Appointment in 1 week. Edema Control - Lymphedema / Segmental Compressive Device / Other: Elevate, Exercise Daily and Avoid Standing for Long Periods of Time. Elevate legs to the level of the heart and pump ankles as often as possible Elevate leg(s) parallel to the floor when sitting. WOUND #1: - Calcaneus Wound Laterality: Left, Midline Cleanser: Normal Saline (Generic) 3 x Per Week/30 Days Discharge Instructions: Wash your hands with soap and water. Remove old dressing,  discard into plastic bag and place into trash. Cleanse the wound with Normal Saline prior to applying a clean dressing using gauze sponges, not tissues or cotton balls. Do not scrub or use excessive force. Pat dry using gauze sponges, not tissue or cotton balls. Primary Dressing: Prisma 4.34 (in) (Generic) 3 x Per Week/30 Days Discharge Instructions: Moisten w/normal saline or sterile water; Cover wound as directed. Do not remove from wound bed. Secondary Dressing: Mepilex Border Flex, 4x4 (in/in) (Generic) 3 x Per Week/30 Days  Discharge Instructions: Apply to wound as directed. Do not cut. 1. Would recommend currently that we going continue with the silver collagen is that seems to be doing well and the patient is in agreement with the plan. 2. I am also can recommend that we have the patient continue to elevate his leg and keep pressure off of the heels much as possible I think that is doing a good job and he has done excellent up to this point. We will see patient back for reevaluation in 1 week here in the clinic. If anything worsens or changes patient will contact our office for additional recommendations. Electronic Signature(s) Signed: 07/15/2020 1:19:58 PM By: Lenda Kelp PA-C Entered By: Lenda Kelp on 07/15/2020 13:19:58 Miguel Harvey ([id number]) -------------------------------------------------------------------------------- SuperBill Details Patient Name: Miguel Harvey Date of Service: 07/15/2020 Medical Record Number: [id number] Patient Account Number: [id number] Date of Birth/Sex: August 30, 1945 (75 y.o. M) Treating RN: Yevonne Pax Primary Care Provider: [name] Other Clinician: Lolita Cram Referring Provider: [name] Treating Provider/Extender: Rowan Blase in Treatment: 1 Diagnosis Coding ICD-10 Codes Code Description [phone] Type 2 diabetes mellitus with foot ulcer L97.422 Non-pressure chronic ulcer of left heel and midfoot with fat layer  exposed I10 Essential (primary) hypertension I25.10 Atherosclerotic heart disease of native coronary artery without angina pectoris I73.89 Other specified peripheral vascular diseases Z79.01 Long term (current) use of anticoagulants Facility Procedures CPT4 Code: [id number] Description: 99213 - WOUND CARE VISIT-LEV 3 EST PT Modifier: Quantity: 1 Physician Procedures CPT4 Code: [id number] Description: 99213 - WC PHYS LEVEL 3 - EST PT Modifier: Quantity: 1 CPT4 Code: Description: ICD-10 Diagnosis Description E11.621 Type 2 diabetes mellitus with foot ulcer L97.422 Non-pressure chronic ulcer of left heel and midfoot with fat layer exp I10 Essential (primary) hypertension I25.10 Atherosclerotic heart disease of native  coronary artery without angina Modifier: osed pectoris Quantity: Electronic Signature(s) Signed: 07/15/2020 1:20:15 PM By: Lenda Kelp PA-C Entered By: Lenda Kelp on 07/15/2020 13:20:15

## 2020-07-16 NOTE — Progress Notes (Signed)
Miguel Harvey, Miguel Harvey ([id number]) Visit Report for 07/15/2020 Arrival Information Details Patient Name: Miguel Harvey, Miguel Harvey Date of Service: 07/15/2020 12:45 PM Medical Record Number: [id number] Patient Account Number: [id number] Date of Birth/Sex: [birth year] (74 y.o. M) Treating RN: Hansel Feinstein Primary Care Jamea Robicheaux: [name] Other Clinician: Lolita Cram Referring Derelle Cockrell: [name] Treating Merlin Golden/Extender: Rowan Blase in Treatment: 1 Visit Information History Since Last Visit Added or deleted any medications: No Patient Arrived: Wheel Chair Had a fall or experienced change in No Arrival Time: 12:53 activities of daily living that may affect Accompanied By: self risk of falls: Transfer Assistance: None Hospitalized since last visit: No Patient Identification Verified: Yes Has Dressing in Place as Prescribed: Yes Secondary Verification Process Completed: Yes Pain Present Now: No Patient Has Alerts: Yes Patient Alerts: Patient on Blood Thinner Type II Diabetic 81MG  aspirin Plavix Electronic Signature(s) Signed: 07/15/2020 3:11:20 PM By: 07/17/2020 Entered By: Hansel Feinstein on 07/15/2020 12:54:03 07/17/2020 (Miguel Harvey) -------------------------------------------------------------------------------- Clinic Level of Care Assessment Details Patient Name: [id number] Date of Service: 07/15/2020 12:45 PM Medical Record Number: 07/17/2020 Patient Account Number: [id number] Date of Birth/Sex: 05-16-1945 (74 y.o. M) Treating RN: 12-28-1970 Primary Care Kabrea Seeney: Yevonne Pax Other Clinician: [name] Referring Beata Beason: Lolita Cram Treating Kamera Dubas/Extender: [name] in Treatment: 1 Clinic Level of Care Assessment Items TOOL 4 Quantity Score X - Use when only an EandM is performed on FOLLOW-UP visit 1 0 ASSESSMENTS - Nursing Assessment / Reassessment X - Reassessment of Co-morbidities (includes updates in patient status) 1 10 X- 1 5 Reassessment of  Adherence to Treatment Plan ASSESSMENTS - Wound and Skin Assessment / Reassessment X - Simple Wound Assessment / Reassessment - one wound 1 5 []  - 0 Complex Wound Assessment / Reassessment - multiple wounds []  - 0 Dermatologic / Skin Assessment (not related to wound area) ASSESSMENTS - Focused Assessment []  - Circumferential Edema Measurements - multi extremities 0 []  - 0 Nutritional Assessment / Counseling / Intervention []  - 0 Lower Extremity Assessment (monofilament, tuning fork, pulses) []  - 0 Peripheral Arterial Disease Assessment (using hand held doppler) ASSESSMENTS - Ostomy and/or Continence Assessment and Care []  - Incontinence Assessment and Management 0 []  - 0 Ostomy Care Assessment and Management (repouching, etc.) PROCESS - Coordination of Care X - Simple Patient / Family Education for ongoing care 1 15 []  - 0 Complex (extensive) Patient / Family Education for ongoing care X- 1 10 Staff obtains Rowan Blase, Records, Test Results / Process Orders []  - 0 Staff telephones HHA, Nursing Homes / Clarify orders / etc []  - 0 Routine Transfer to another Facility (non-emergent condition) []  - 0 Routine Hospital Admission (non-emergent condition) []  - 0 New Admissions / / Ordering NPWT, Apligraf, etc. []  - 0 Emergency Hospital Admission (emergent condition) X- 1 10 Simple Discharge Coordination []  - 0 Complex (extensive) Discharge Coordination PROCESS - Special Needs []  - Pediatric / Minor Patient Management 0 []  - 0 Isolation Patient Management []  - 0 Hearing / Language / Visual special needs []  - 0 Assessment of Community assistance (transportation, D/C planning, etc.) []  - 0 Additional assistance / Altered mentation []  - 0 Support Surface(s) Assessment (bed, cushion, seat, etc.) INTERVENTIONS - Wound Cleansing / Measurement Miguel Harvey, Miguel Harvey. ( ) X- 1 5 Simple Wound Cleansing - one wound []  - 0 Complex Wound Cleansing - multiple  wounds X- 1 5 Wound Imaging (photographs - any number of wounds) []  - 0 Wound Tracing (instead of photographs) X- 1  5 Simple Wound Measurement - one wound []  - 0 Complex Wound Measurement - multiple wounds INTERVENTIONS - Wound Dressings X - Small Wound Dressing one or multiple wounds 1 10 []  - 0 Medium Wound Dressing one or multiple wounds []  - 0 Large Wound Dressing one or multiple wounds X- 1 5 Application of Medications - topical []  - 0 Application of Medications - injection INTERVENTIONS - Miscellaneous []  - External ear exam 0 []  - 0 Specimen Collection (cultures, biopsies, blood, body fluids, etc.) []  - 0 Specimen(s) / Culture(s) sent or taken to Lab for analysis []  - 0 Patient Transfer (multiple staff / Nurse, adult / Similar devices) []  - 0 Simple Staple / Suture removal (25 or less) []  - 0 Complex Staple / Suture removal (26 or more) []  - 0 Hypo / Hyperglycemic Management (close monitor of Blood Glucose) []  - 0 Ankle / Brachial Index (ABI) - do not check if billed separately X- 1 5 Vital Signs Has the patient been seen at the hospital within the last three years: Yes Total Score: 90 Level Of Care: New/Established - Level 3 Electronic Signature(s) Signed: 07/16/2020 7:54:04 AM By: Yevonne Pax RN Entered By: Yevonne Pax on 07/15/2020 13:14:46 Miguel Harvey ([id number]) -------------------------------------------------------------------------------- Encounter Discharge Information Details Patient Name: Miguel Harvey Date of Service: 07/15/2020 12:45 PM Medical Record Number: [id number] Patient Account Number: [id number] Date of Birth/Sex: February 08, 1946 (74 y.o. M) Treating RN: Hansel Feinstein Primary Care Kanoa Phillippi: [name] Other Clinician: Lolita Cram Referring Anquanette Bahner: [name] Treating Arvada Seaborn/Extender: Rowan Blase in Treatment: 1 Encounter Discharge Information Items Discharge Condition: Stable Ambulatory Status: Wheelchair Discharge  Destination: Skilled Nursing Facility Telephoned: No Orders Sent: Yes Transportation: Other Accompanied By: self Schedule Follow-up Appointment: Yes Clinical Summary of Care: Electronic Signature(s) Signed: 07/15/2020 3:11:20 PM By: Hansel Feinstein Entered By: Hansel Feinstein on 07/15/2020 13:24:25 Miguel Harvey ([id number]) -------------------------------------------------------------------------------- Lower Extremity Assessment Details Patient Name: Miguel Harvey Date of Service: 07/15/2020 12:45 PM Medical Record Number: [id number] Patient Account Number: [id number] Date of Birth/Sex: [birth year] (74 y.o. M) Treating RN: Hansel Feinstein Primary Care Shanyla Marconi: [name] Other Clinician: Lolita Cram Referring Ark Agrusa: [name] Treating Horris Speros/Extender: Allen Derry Weeks in Treatment: 1 Edema Assessment Assessed: [Left: Yes] [Right: No] [Left: Edema] [Right: :] Calf Left: Right: Point of Measurement: 33 cm From Medial Instep 41 cm Ankle Left: Right: Point of Measurement: 10 cm From Medial Instep 24.5 cm Vascular Assessment Pulses: Dorsalis Pedis Palpable: [Left:Yes] Electronic Signature(s) Signed: 07/15/2020 3:11:20 PM By: Hansel Feinstein Entered By: Hansel Feinstein on 07/15/2020 12:58:31 Miguel Harvey ([id number]) -------------------------------------------------------------------------------- Multi Wound Chart Details Patient Name: Miguel Harvey Date of Service: 07/15/2020 12:45 PM Medical Record Number: [id number] Patient Account Number: [id number] Date of Birth/Sex: 1946-02-24 (74 y.o. M) Treating RN: Yevonne Pax Primary Care Blu Lori: [name] Other Clinician: Lolita Cram Referring Makaylie Dedeaux: [name] Treating Whalen Trompeter/Extender: Rowan Blase in Treatment: 1 Vital Signs Height(in): 72 Pulse(bpm): 88 Weight(lbs): 220 Blood Pressure(mmHg): 122/77 Body Mass Index(BMI): 30 Temperature(F): 98 Respiratory Rate(breaths/min): 18 Photos: [N/A:N/A] Wound  Location: Left, Midline Calcaneus N/A N/A Wounding Event: Trauma N/A N/A Primary Etiology: Diabetic Wound/Ulcer of the Lower N/A N/A Extremity Comorbid History: Cataracts, Chronic Obstructive N/A N/A Pulmonary Disease (COPD), Congestive Heart Failure, Coronary Artery Disease, Hypertension, Type II Diabetes Date Acquired: 06/07/2020 N/A N/A Weeks of Treatment: 1 N/A N/A Wound Status: Open N/A N/A Measurements L x W x D (cm) 1.7x1.2x0.2 N/A N/A Area (cm) : 1.602 N/A N/A Volume (cm) : 0.32 N/A  N/A % Reduction in Area: 24.50% N/A N/A % Reduction in Volume: 49.70% N/A N/A Classification: Grade 2 N/A N/A Exudate Amount: Medium N/A N/A Exudate Type: Serosanguineous N/A N/A Exudate Color: red, brown N/A N/A Wound Margin: Distinct, outline attached N/A N/A Granulation Amount: Medium (34-66%) N/A N/A Granulation Quality: Pink N/A N/A Necrotic Amount: Medium (34-66%) N/A N/A Exposed Structures: Fat Layer (Subcutaneous Tissue): N/A N/A Yes Fascia: No Tendon: No Muscle: No Joint: No Bone: No Epithelialization: Small (1-33%) N/A N/A Treatment Notes Electronic Signature(s) Signed: 07/16/2020 7:54:04 AM By: Yevonne Pax RN Entered By: Yevonne Pax on 07/15/2020 13:12:57 Miguel Harvey ([id number]) Miguel Harvey, Miguel Harvey ([id number]) -------------------------------------------------------------------------------- Multi-Disciplinary Care Plan Details Patient Name: Miguel Harvey Date of Service: 07/15/2020 12:45 PM Medical Record Number: [id number] Patient Account Number: [id number] Date of Birth/Sex: 30-Jun-1945 (74 y.o. M) Treating RN: Yevonne Pax Primary Care Ledger Heindl: [name] Other Clinician: Lolita Cram Referring Shakeisha Horine: [name] Treating Pilot Prindle/Extender: Rowan Blase in Treatment: 1 Active Inactive Abuse / Safety / Falls / Self Care Management Nursing Diagnoses: Potential for injury related to falls Goals: Patient will not experience any injury related to  falls Date Initiated: 07/08/2020 Target Resolution Date: 08/07/2020 Goal Status: Active Interventions: Assess Activities of Daily Living upon admission and as needed Assess fall risk on admission and as needed Assess: immobility, friction, shearing, incontinence upon admission and as needed Assess impairment of mobility on admission and as needed per policy Assess personal safety and home safety (as indicated) on admission and as needed Assess self care needs on admission and as needed Notes: Nutrition Nursing Diagnoses: Impaired glucose control: actual or potential Goals: Patient/caregiver verbalizes understanding of need to maintain therapeutic glucose control per primary care physician Date Initiated: 07/08/2020 Target Resolution Date: 08/07/2020 Goal Status: Active Interventions: Assess HgA1c results as ordered upon admission and as needed Assess patient nutrition upon admission and as needed per policy Notes: Wound/Skin Impairment Nursing Diagnoses: Knowledge deficit related to ulceration/compromised skin integrity Goals: Patient/caregiver will verbalize understanding of skin care regimen Date Initiated: 07/08/2020 Target Resolution Date: 08/07/2020 Goal Status: Active Ulcer/skin breakdown will have a volume reduction of 30% by week 4 Date Initiated: 07/08/2020 Target Resolution Date: 08/07/2020 Goal Status: Active Ulcer/skin breakdown will have a volume reduction of 50% by week 8 Date Initiated: 07/08/2020 Target Resolution Date: 09/07/2020 Goal Status: Active Ulcer/skin breakdown will have a volume reduction of 80% by week 12 Date Initiated: 07/08/2020 Target Resolution Date: 10/07/2020 Miguel Harvey, Miguel Harvey ([id number]) Goal Status: Active Ulcer/skin breakdown will heal within 14 weeks Date Initiated: 07/08/2020 Target Resolution Date: 11/07/2020 Goal Status: Active Interventions: Assess patient/caregiver ability to obtain necessary supplies Assess patient/caregiver ability to  perform ulcer/skin care regimen upon admission and as needed Assess ulceration(s) every visit Notes: Electronic Signature(s) Signed: 07/16/2020 7:54:04 AM By: Yevonne Pax RN Entered By: Yevonne Pax on 07/15/2020 13:12:42 Miguel Harvey ([id number]) -------------------------------------------------------------------------------- Pain Assessment Details Patient Name: Miguel Harvey Date of Service: 07/15/2020 12:45 PM Medical Record Number: [id number] Patient Account Number: [id number] Date of Birth/Sex: December 29, 1945 (74 y.o. M) Treating RN: Hansel Feinstein Primary Care Brynlea Spindler: [name] Other Clinician: Lolita Cram Referring Freya Zobrist: [name] Treating Toddy Boyd/Extender: Rowan Blase in Treatment: 1 Active Problems Location of Pain Severity and Description of Pain Patient Has Paino No Site Locations Rate the pain. Current Pain Level: 0 Pain Management and Medication Current Pain Management: Electronic Signature(s) Signed: 07/15/2020 3:11:20 PM By: Hansel Feinstein Entered By: Hansel Feinstein on 07/15/2020 12:54:35 Miguel Harvey ([id number]) -------------------------------------------------------------------------------- Patient/Caregiver Education Details  Patient Name: Miguel Harvey, Miguel Harvey Date of Service: 07/15/2020 12:45 PM Medical Record Number: [id number] Patient Account Number: [id number] Date of Birth/Gender: September 08, 1945 (75 y.o. M) Treating RN: Yevonne Pax Primary Care Physician: [name] Other Clinician: Lolita Cram Referring Physician: [name] Treating Physician/Extender: Rowan Blase in Treatment: 1 Education Assessment Education Provided To: Patient Education Topics Provided Wound/Skin Impairment: Methods: Explain/Verbal Responses: State content correctly Electronic Signature(s) Signed: 07/16/2020 7:54:04 AM By: Yevonne Pax RN Entered By: Yevonne Pax on 07/15/2020 13:15:20 Miguel Harvey  ([id number]) -------------------------------------------------------------------------------- Wound Assessment Details Patient Name: Miguel Harvey Date of Service: 07/15/2020 12:45 PM Medical Record Number: [id number] Patient Account Number: [id number] Date of Birth/Sex: June 04, 1945 (74 y.o. M) Treating RN: Hansel Feinstein Primary Care Juandiego Kolenovic: [name] Other Clinician: Lolita Cram Referring Hayley Horn: [name] Treating Ayano Douthitt/Extender: Allen Derry Weeks in Treatment: 1 Wound Status Wound Number: 1 Primary Diabetic Wound/Ulcer of the Lower Extremity Etiology: Wound Location: Left, Midline Calcaneus Wound Open Wounding Event: Trauma Status: Date Acquired: 06/07/2020 Comorbid Cataracts, Chronic Obstructive Pulmonary Disease (COPD), Weeks Of Treatment: 1 History: Congestive Heart Failure, Coronary Artery Disease, Clustered Wound: No Hypertension, Type II Diabetes Photos Wound Measurements Length: (cm) 1.7 Width: (cm) 1.2 Depth: (cm) 0.2 Area: (cm) 1.602 Volume: (cm) 0.32 % Reduction in Area: 24.5% % Reduction in Volume: 49.7% Epithelialization: Small (1-33%) Tunneling: No Undermining: No Wound Description Classification: Grade 2 Wound Margin: Distinct, outline attached Exudate Amount: Medium Exudate Type: Serosanguineous Exudate Color: red, brown Foul Odor After Cleansing: No Slough/Fibrino Yes Wound Bed Granulation Amount: Medium (34-66%) Exposed Structure Granulation Quality: Pink Fascia Exposed: No Necrotic Amount: Medium (34-66%) Fat Layer (Subcutaneous Tissue) Exposed: Yes Necrotic Quality: Adherent Slough Tendon Exposed: No Muscle Exposed: No Joint Exposed: No Bone Exposed: No Treatment Notes Wound #1 (Calcaneus) Wound Laterality: Left, Midline Cleanser Normal Saline Discharge Instruction: Wash your hands with soap and water. Remove old dressing, discard into plastic bag and place into trash. Cleanse the wound with Normal Saline prior to applying  a clean dressing using gauze sponges, not tissues or cotton balls. Do not Miguel Harvey, Miguel Harvey ([id number]) scrub or use excessive force. Pat dry using gauze sponges, not tissue or cotton balls. Peri-Wound Care Topical Primary Dressing Prisma 4.34 (in) Discharge Instruction: Moisten w/normal saline or sterile water; Cover wound as directed. Do not remove from wound bed. Secondary Dressing Mepilex Border Flex, 4x4 (in/in) Discharge Instruction: Apply to wound as directed. Do not cut. Secured With Compression Wrap Compression Stockings Add-Ons Electronic Signature(s) Signed: 07/15/2020 3:11:20 PM By: Hansel Feinstein Entered By: Hansel Feinstein on 07/15/2020 12:57:24 Miguel Harvey ([id number]) -------------------------------------------------------------------------------- Vitals Details Patient Name: Miguel Harvey Date of Service: 07/15/2020 12:45 PM Medical Record Number: [id number] Patient Account Number: [id number] Date of Birth/Sex: 1945-06-20 (74 y.o. M) Treating RN: Hansel Feinstein Primary Care Vendetta Pittinger: [name] Other Clinician: Lolita Cram Referring Chantele Corado: [name] Treating Grier Vu/Extender: Rowan Blase in Treatment: 1 Vital Signs Time Taken: 12:54 Temperature (F): 98 Height (in): 72 Pulse (bpm): 88 Weight (lbs): 220 Respiratory Rate (breaths/min): 18 Body Mass Index (BMI): 29.8 Blood Pressure (mmHg): 122/77 Reference Range: 80 - 120 mg / dl Electronic Signature(s) Signed: 07/15/2020 3:11:20 PM By: Hansel Feinstein Entered ByHansel Feinstein on 07/15/2020 12:54:27

## 2020-07-23 ENCOUNTER — Ambulatory Visit: Payer: Medicare Other | Admitting: Internal Medicine

## 2020-07-29 ENCOUNTER — Ambulatory Visit: Payer: Medicare Other | Admitting: Physician Assistant

## 2020-07-30 ENCOUNTER — Ambulatory Visit: Payer: Medicare Other | Admitting: Physician Assistant

## 2020-08-01 ENCOUNTER — Other Ambulatory Visit: Payer: Self-pay

## 2020-08-01 ENCOUNTER — Encounter: Payer: Medicare Other | Attending: Physician Assistant | Admitting: Physician Assistant

## 2020-08-01 DIAGNOSIS — E11621 Type 2 diabetes mellitus with foot ulcer: Secondary | ICD-10-CM | POA: Insufficient documentation

## 2020-08-01 DIAGNOSIS — L97429 Non-pressure chronic ulcer of left heel and midfoot with unspecified severity: Secondary | ICD-10-CM | POA: Diagnosis present

## 2020-08-01 DIAGNOSIS — L97422 Non-pressure chronic ulcer of left heel and midfoot with fat layer exposed: Secondary | ICD-10-CM | POA: Insufficient documentation

## 2020-08-01 DIAGNOSIS — Z7902 Long term (current) use of antithrombotics/antiplatelets: Secondary | ICD-10-CM | POA: Diagnosis not present

## 2020-08-01 DIAGNOSIS — E1151 Type 2 diabetes mellitus with diabetic peripheral angiopathy without gangrene: Secondary | ICD-10-CM | POA: Diagnosis not present

## 2020-08-01 NOTE — Progress Notes (Addendum)
Miguel Harvey, Miguel Harvey ([id number]) Visit Report for 08/01/2020 Chief Complaint Document Details Patient Name: Miguel Harvey, Miguel Harvey Date of Service: 08/01/2020 1:00 PM Medical Record Number: [id number] Patient Account Number: [id number] Date of Birth/Sex: Aug 22, 1945 (74 y.o. M) Treating RN: Rogers Blocker Primary Care Provider: [name] Other Clinician: Referring Provider: [name] Treating Provider/Extender: Rowan Blase in Treatment: 3 Information Obtained from: Patient Chief Complaint Left heel ulcer Electronic Signature(s) Signed: 08/01/2020 12:53:51 PM By: Lenda Kelp PA-C Entered By: Lenda Kelp on 08/01/2020 12:53:51 Miguel Harvey ([id number]) -------------------------------------------------------------------------------- Debridement Details Patient Name: Miguel Harvey Date of Service: 08/01/2020 1:00 PM Medical Record Number: [id number] Patient Account Number: [id number] Date of Birth/Sex: 1946-02-26 (74 y.o. M) Treating RN: Huel Coventry Primary Care Provider: [name] Other Clinician: Referring Provider: [name] Treating Provider/Extender: Rowan Blase in Treatment: 3 Debridement Performed for Wound #1 Left,Midline Calcaneus Assessment: Performed By: Physician Nelida Meuse., PA-C Debridement Type: Debridement Severity of Tissue Pre Debridement: Fat layer exposed Level of Consciousness (Pre- Awake and Alert procedure): Pre-procedure Verification/Time Out Yes - 14:29 Taken: Start Time: 14:29 Total Area Debrided (L x W): 1 (cm) x 1 (cm) = 1 (cm) Tissue and other material Viable, Non-Viable, Callus, Slough, Subcutaneous, Slough debrided: Level: Skin/Subcutaneous Tissue Debridement Description: Excisional Instrument: Curette Bleeding: Minimum Hemostasis Achieved: Pressure Response to Treatment: Procedure was tolerated well Level of Consciousness (Post- Awake and Alert procedure): Post Debridement Measurements of Total Wound Length: (cm) 0.5 Width:  (cm) 0.3 Depth: (cm) 0.1 Volume: (cm) 0.012 Character of Wound/Ulcer Post Debridement: Stable Severity of Tissue Post Debridement: Fat layer exposed Post Procedure Diagnosis Same as Pre-procedure Electronic Signature(s) Signed: 08/06/2020 5:13:28 PM By: Elliot Gurney, BSN, RN, CWS, Kim RN, BSN Signed: 08/07/2020 5:38:12 PM By: Lenda Kelp PA-C Previous Signature: 08/01/2020 5:11:22 PM Version By: Lajean Manes RN Previous Signature: 08/02/2020 6:22:42 PM Version By: Lenda Kelp PA-C Entered By: Elliot Gurney BSN, RN, CWS, Kim on 08/06/2020 17:13:28 CALON, FILAR ([id number]) -------------------------------------------------------------------------------- HPI Details Patient Name: Miguel Harvey Date of Service: 08/01/2020 1:00 PM Medical Record Number: [id number] Patient Account Number: [id number] Date of Birth/Sex: [birth year] (74 y.o. M) Treating RN: Rogers Blocker Primary Care Provider: [name] Other Clinician: Referring Provider: [name] Treating Provider/Extender: Rowan Blase in Treatment: 3 History of Present Illness HPI Description: 07/08/20 patient presents today for initial inspection here in our clinic concerning issues that he is been having with his left heel for about a month he tells me. To be honest the wound looks like it is probably been there longer than a month but I cannot be 100% sure of that. Fortunately there does not appear to be any signs of infection. We did perform arterial studies today and that was also somewhat low as far as the reading was concerned at 0.63. He does have a known history of peripheral vascular disease. He has diabetes mellitus type 2, hypertension, coronary artery disease, peripheral vascular disease, and is on long-term anticoagulant therapy. Specifically Plavix. Right now the wound does appear to be showing some signs of healing which is good news. Nonetheless given his poor arterial flow I do want him to see [facility] for a formal arterial study with ABI and TBI to further evaluate what is going on here. 07/15/2020 upon evaluation today patient appears to be doing excellent in regard to his heel ulcer. He was seen by vascular. Unfortunately we do not have the report documented yet that I can review though  the patient tells me that the provider said that he had good blood flow. with that being said right now he seems to be doing excellent and is making great progress. Overall I think the wound is headed in the right direction. 08/01/2020 evaluation today patient appears to be doing well with regard to his wound. Fortunately there is no signs that the moment that there is any evidence of infection which is good news. No fevers, chills, nausea, vomiting, or diarrhea. With that being said he has a lot of callus and he tells me that the dressing was only changed once since he was last here. Again I have no way to independently verify that that if that is true that does disappoint me. Electronic Signature(s) Signed: 08/01/2020 2:35:32 PM By: Lenda Kelp PA-C Entered By: Lenda Kelp on 08/01/2020 14:35:32 Miguel Harvey ([id number]) -------------------------------------------------------------------------------- Physical Exam Details Patient Name: Miguel Harvey Date of Service: 08/01/2020 1:00 PM Medical Record Number: [id number] Patient Account Number: [id number] Date of Birth/Sex: Feb 15, 1946 (74 y.o. M) Treating RN: Rogers Blocker Primary Care Provider: [name] Other Clinician: Referring Provider: [name] Treating Provider/Extender: Allen Derry Weeks in Treatment: 3 Constitutional Well-nourished and well-hydrated in no acute distress. Respiratory normal breathing without difficulty. Psychiatric this patient is able to make decisions and demonstrates good insight into disease process. Alert and Oriented x 3. pleasant and cooperative. Notes Upon inspection patient's wound bed actually  showed signs of good granulation epithelization at this point he does have a lot of callus I did have to clean some of this away there was some slough down to good subcutaneous tissue as well. There were very little openings remaining but nonetheless I do think he has a little bit of healing left ago. Electronic Signature(s) Signed: 08/01/2020 2:35:52 PM By: Lenda Kelp PA-C Entered By: Lenda Kelp on 08/01/2020 14:35:52 Miguel Harvey ([id number]) -------------------------------------------------------------------------------- Physician Orders Details Patient Name: Miguel Harvey Date of Service: 08/01/2020 1:00 PM Medical Record Number: [id number] Patient Account Number: [id number] Date of Birth/Sex: 1945-06-16 (74 y.o. M) Treating RN: Rogers Blocker Primary Care Provider: [name] Other Clinician: Referring Provider: [name] Treating Provider/Extender: Rowan Blase in Treatment: 3 Verbal / Phone Orders: No Diagnosis Coding ICD-10 Coding Code Description E11.621 Type 2 diabetes mellitus with foot ulcer L97.422 Non-pressure chronic ulcer of left heel and midfoot with fat layer exposed I10 Essential (primary) hypertension I25.10 Atherosclerotic heart disease of native coronary artery without angina pectoris I73.89 Other specified peripheral vascular diseases Z79.01 Long term (current) use of anticoagulants Follow-up Appointments o Return Appointment in 1 week. Edema Control - Lymphedema / Segmental Compressive Device / Other o Elevate, Exercise Daily and Avoid Standing for Long Periods of Time. o Elevate legs to the level of the heart and pump ankles as often as possible o Elevate leg(s) parallel to the floor when sitting. Wound Treatment Wound #1 - Calcaneus Wound Laterality: Left, Midline Cleanser: Normal Saline (Generic) 3 x Per Week/30 Days Discharge Instructions: Wash your hands with soap and water. Remove old dressing, discard into plastic bag and  place into trash. Cleanse the wound with Normal Saline prior to applying a clean dressing using gauze sponges, not tissues or cotton balls. Do not scrub or use excessive force. Pat dry using gauze sponges, not tissue or cotton balls. Primary Dressing: Xeroform-HBD 2x2 (in/in) 3 x Per Week/30 Days Discharge Instructions: Apply Xeroform-HBD double layer 2x2 (in/in) as directed (extras sent with patient) Secondary Dressing: Mepilex Border Flex, 4x4 (  in/in) (Generic) 3 x Per Week/30 Days Discharge Instructions: Apply to wound as directed. Do not cut. Electronic Signature(s) Signed: 08/01/2020 5:11:22 PM By: Phillis HaggisSanchez Pereyda, Dondra PraderKenia RN Signed: 08/02/2020 6:22:42 PM By: Lenda KelpStone III, Kohl Polinsky PA-C Entered By: Phillis HaggisSanchez Pereyda, Dondra PraderKenia on 08/01/2020 14:35:04 Miguel Harvey, Miguel H. ([id number]) -------------------------------------------------------------------------------- Problem List Details Patient Name: Miguel Harvey, Miguel H. Date of Service: 08/01/2020 1:00 PM Medical Record Number: [id number] Patient Account Number: [id number] Date of Birth/Sex: May 06, 1945 (74 y.o. M) Treating RN: Rogers BlockerSanchez, Kenia Primary Care Provider: Eloisa NorthernAmin, Saad Other Clinician: Referring Provider: Eloisa NorthernAmin, Saad Treating Provider/Extender: Allen DerryStone, Joaquina Nissen Weeks in Treatment: 3 Active Problems ICD-10 Encounter Code Description Active Date MDM Diagnosis E11.621 Type 2 diabetes mellitus with foot ulcer 07/08/2020 No Yes L97.422 Non-pressure chronic ulcer of left heel and midfoot with fat layer 07/08/2020 No Yes exposed I10 Essential (primary) hypertension 07/08/2020 No Yes I25.10 Atherosclerotic heart disease of native coronary artery without angina 07/08/2020 No Yes pectoris I73.89 Other specified peripheral vascular diseases 07/08/2020 No Yes Z79.01 Long term (current) use of anticoagulants 07/08/2020 No Yes Inactive Problems Resolved Problems Electronic Signature(s) Signed: 08/01/2020 12:53:46 PM By: Lenda KelpStone III, Thaison Kolodziejski PA-C Entered By: Lenda KelpStone III, Tessah Patchen on  08/01/2020 12:53:46 Miguel Harvey, Johntavious H. ([id number]) -------------------------------------------------------------------------------- Progress Note Details Patient Name: Miguel Harvey, Miguel H. Date of Service: 08/01/2020 1:00 PM Medical Record Number: [id number] Patient Account Number: [id number] Date of Birth/Sex: May 06, 1945 (74 y.o. M) Treating RN: Rogers BlockerSanchez, Kenia Primary Care Provider: Eloisa NorthernAmin, Saad Other Clinician: Referring Provider: Eloisa NorthernAmin, Saad Treating Provider/Extender: Rowan BlaseStone, [name] Weeks in Treatment: 3 Subjective Chief Complaint Information obtained from Patient Left heel ulcer History of Present Illness (HPI) 07/08/20 patient presents today for initial inspection here in our clinic concerning issues that he is been having with his left heel for about a month he tells me. To be honest the wound looks like it is probably been there longer than a month but I cannot be 100% sure of that. Fortunately there does not appear to be any signs of infection. We did perform arterial studies today and that was also somewhat low as far as the reading was concerned at 0.63. He does have a known history of peripheral vascular disease. He has diabetes mellitus type 2, hypertension, coronary artery disease, peripheral vascular disease, and is on long-term anticoagulant therapy. Specifically Plavix. Right now the wound does appear to be showing some signs of healing which is good news. Nonetheless given his poor arterial flow I do want him to see [facility] for a formal arterial study with ABI and TBI to further evaluate what is going on here. 07/15/2020 upon evaluation today patient appears to be doing excellent in regard to his heel ulcer. He was seen by vascular. Unfortunately we do not have the report documented yet that I can review though the patient tells me that the provider said that he had good blood flow. with that being said right now he seems to be doing excellent and is making great  progress. Overall I think the wound is headed in the right direction. 08/01/2020 evaluation today patient appears to be doing well with regard to his wound. Fortunately there is no signs that the moment that there is any evidence of infection which is good news. No fevers, chills, nausea, vomiting, or diarrhea. With that being said he has a lot of callus and he tells me that the dressing was only changed once since he was last here. Again I have no way to independently verify that that if that is true that  does disappoint me. Objective Constitutional Well-nourished and well-hydrated in no acute distress. Vitals Time Taken: 1:30 PM, Temperature: 98.4 F, Pulse: 80 bpm, Respiratory Rate: 18 breaths/min, Blood Pressure: 135/77 mmHg. Respiratory normal breathing without difficulty. Psychiatric this patient is able to make decisions and demonstrates good insight into disease process. Alert and Oriented x 3. pleasant and cooperative. General Notes: Upon inspection patient's wound bed actually showed signs of good granulation epithelization at this point he does have a lot of callus I did have to clean some of this away there was some slough down to good subcutaneous tissue as well. There were very little openings remaining but nonetheless I do think he has a little bit of healing left ago. Integumentary (Hair, Skin) Wound #1 status is Open. Original cause of wound was Trauma. The date acquired was: 06/07/2020. The wound has been in treatment 3 weeks. The wound is located on the Left,Midline Calcaneus. The wound measures 0.4cm length x 0.7cm width x 0.1cm depth; 0.22cm^2 area and 0.022cm^3 volume. There is no tunneling or undermining noted. There is a none present amount of drainage noted. The wound margin is distinct with the outline attached to the wound base. There is no granulation within the wound bed. There is a large (67-100%) amount of necrotic tissue within the wound bed including Eschar and  Adherent Slough. Assessment Active Problems Miguel Harvey, Miguel Harvey ([id number]) ICD-10 Type 2 diabetes mellitus with foot ulcer Non-pressure chronic ulcer of left heel and midfoot with fat layer exposed Essential (primary) hypertension Atherosclerotic heart disease of native coronary artery without angina pectoris Other specified peripheral vascular diseases Long term (current) use of anticoagulants Procedures Wound #1 Pre-procedure diagnosis of Wound #1 is a Diabetic Wound/Ulcer of the Lower Extremity located on the Left,Midline Calcaneus .Severity of Tissue Pre Debridement is: Fat layer exposed. There was a Excisional Skin/Subcutaneous Tissue Debridement with a total area of 1 sq cm performed by Nelida Meuse., PA-C. With the following instrument(s): Curette to remove Viable and Non-Viable tissue/material. Material removed includes Callus, Subcutaneous Tissue, and Slough. A time out was conducted at 14:29, prior to the start of the procedure. A Minimum amount of bleeding was controlled with Pressure. The procedure was tolerated well. Post Debridement Measurements: 0.5cm length x 0.3cm width x 0.1cm depth; 0.012cm^3 volume. Character of Wound/Ulcer Post Debridement is stable. Severity of Tissue Post Debridement is: Fat layer exposed. Post procedure Diagnosis Wound #1: Same as Pre-Procedure Plan Follow-up Appointments: Return Appointment in 1 week. Edema Control - Lymphedema / Segmental Compressive Device / Other: Elevate, Exercise Daily and Avoid Standing for Long Periods of Time. Elevate legs to the level of the heart and pump ankles as often as possible Elevate leg(s) parallel to the floor when sitting. WOUND #1: - Calcaneus Wound Laterality: Left, Midline Cleanser: Normal Saline (Generic) 3 x Per Week/30 Days Discharge Instructions: Wash your hands with soap and water. Remove old dressing, discard into plastic bag and place into trash. Cleanse the wound with Normal Saline prior to  applying a clean dressing using gauze sponges, not tissues or cotton balls. Do not scrub or use excessive force. Pat dry using gauze sponges, not tissue or cotton balls. Primary Dressing: Xeroform-HBD 2x2 (in/in) 3 x Per Week/30 Days Discharge Instructions: Apply Xeroform-HBD double layer 2x2 (in/in) as directed (extras sent with patient) Secondary Dressing: Mepilex Border Flex, 4x4 (in/in) (Generic) 3 x Per Week/30 Days Discharge Instructions: Apply to wound as directed. Do not cut. 1. Would recommend currently that we switch to a Xeroform  gauze dressing followed by border foam dressing I think this to be a good way to go over the next week to try to keep things soft here where he is developing a lot of callus. 2. I am also can recommend that we continue to have this changed 3 times a week, Monday, Wednesday, and Friday. 3. I am also can recommend that the patient have a follow-up appointment next week just to make sure everything is doing okay since it sounds like he is not getting dressing changed regularly at the facility. We will see patient back for reevaluation in 1 week here in the clinic. If anything worsens or changes patient will contact our office for additional recommendations. Electronic Signature(s) Signed: 08/06/2020 5:13:51 PM By: Elliot Gurney, BSN, RN, CWS, Kim RN, BSN Signed: 08/07/2020 5:38:12 PM By: Lenda Kelp PA-C Previous Signature: 08/01/2020 2:36:24 PM Version By: Lenda Kelp PA-C Entered By: Elliot Gurney BSN, RN, CWS, Kim on 08/06/2020 17:13:51 Miguel Harvey, Miguel Harvey ([id number]) -------------------------------------------------------------------------------- SuperBill Details Patient Name: Miguel Harvey Date of Service: 08/01/2020 Medical Record Number: [id number] Patient Account Number: [id number] Date of Birth/Sex: 03/03/1946 (74 y.o. M) Treating RN: Rogers Blocker Primary Care Provider: [name] Other Clinician: Referring Provider: [name] Treating Provider/Extender:  Rowan Blase in Treatment: 3 Diagnosis Coding ICD-10 Codes Code Description [phone] Type 2 diabetes mellitus with foot ulcer L97.422 Non-pressure chronic ulcer of left heel and midfoot with fat layer exposed I10 Essential (primary) hypertension I25.10 Atherosclerotic heart disease of native coronary artery without angina pectoris I73.89 Other specified peripheral vascular diseases Z79.01 Long term (current) use of anticoagulants Facility Procedures CPT4 Code: [id number] Description: 11042 - DEB SUBQ TISSUE 20 SQ CM/< Modifier: Quantity: 1 CPT4 Code: Description: ICD-10 Diagnosis Description L97.422 Non-pressure chronic ulcer of left heel and midfoot with fat layer exp Modifier: osed Quantity: Physician Procedures CPT4 Code: [id number] Description: 11042 - WC PHYS SUBQ TISS 20 SQ CM Modifier: Quantity: 1 CPT4 Code: Description: ICD-10 Diagnosis Description L97.422 Non-pressure chronic ulcer of left heel and midfoot with fat layer exp Modifier: osed Quantity: Electronic Signature(s) Signed: 08/01/2020 2:37:56 PM By: Lenda Kelp PA-C Entered By: Lenda Kelp on 08/01/2020 14:37:55

## 2020-08-01 NOTE — Progress Notes (Addendum)
Miguel Harvey, Miguel Harvey ([id number]) Visit Report for 08/01/2020 Arrival Information Details Patient Name: Miguel Harvey, Miguel Harvey Date of Service: 08/01/2020 1:00 PM Medical Record Number: [id number] Patient Account Number: [id number] Date of Birth/Sex: [birth year] (74 y.o. M) Treating RN: Yevonne Pax Primary Care Zuleyma Scharf: [name] Other Clinician: Referring Taivon Haroon: [name] Treating Harmonii Karle/Extender: Rowan Blase in Treatment: 3 Visit Information History Since Last Visit All ordered tests and consults were completed: No Patient Arrived: Wheel Chair Added or deleted any medications: No Arrival Time: 13:29 Any new allergies or adverse reactions: No Accompanied By: self Had a fall or experienced change in No Transfer Assistance: None activities of daily living that may affect Patient Identification Verified: Yes risk of falls: Secondary Verification Process Completed: Yes Signs or symptoms of abuse/neglect since last visito No Patient Has Alerts: Yes Hospitalized since last visit: No Patient Alerts: Patient on Blood Thinner Implantable device outside of the clinic excluding No Type II Diabetic cellular tissue based products placed in the center 81MG  aspirin since last visit: Plavix Has Dressing in Place as Prescribed: Yes Pain Present Now: No Electronic Signature(s) Signed: 08/12/2020 8:33:59 AM By: 08/14/2020 RN Entered By: Yevonne Pax on 08/01/2020 13:29:55 10/01/2020 (Miguel Harvey) -------------------------------------------------------------------------------- Clinic Level of Care Assessment Details Patient Name: [id number] Date of Service: 08/01/2020 1:00 PM Medical Record Number: 10/01/2020 Patient Account Number: [id number] Date of Birth/Sex: 10/10/1945 (74 y.o. M) Treating RN: 12-28-1970 Primary Care Jolina Symonds: Rogers Blocker Other Clinician: Referring Inessa Wardrop: [name] Treating Cleveland Paiz/Extender: [name] in Treatment: 3 Clinic Level of Care  Assessment Items TOOL 1 Quantity Score []  - Use when EandM and Procedure is performed on INITIAL visit 0 ASSESSMENTS - Nursing Assessment / Reassessment []  - General Physical Exam (combine w/ comprehensive assessment (listed just below) when performed on new 0 pt. evals) []  - 0 Comprehensive Assessment (HX, ROS, Risk Assessments, Wounds Hx, etc.) ASSESSMENTS - Wound and Skin Assessment / Reassessment []  - Dermatologic / Skin Assessment (not related to wound area) 0 ASSESSMENTS - Ostomy and/or Continence Assessment and Care []  - Incontinence Assessment and Management 0 []  - 0 Ostomy Care Assessment and Management (repouching, etc.) PROCESS - Coordination of Care []  - Simple Patient / Family Education for ongoing care 0 []  - 0 Complex (extensive) Patient / Family Education for ongoing care []  - 0 Staff obtains Rowan Blase, Records, Test Results / Process Orders []  - 0 Staff telephones HHA, Nursing Homes / Clarify orders / etc []  - 0 Routine Transfer to another Facility (non-emergent condition) []  - 0 Routine Hospital Admission (non-emergent condition) []  - 0 New Admissions / / Ordering NPWT, Apligraf, etc. []  - 0 Emergency Hospital Admission (emergent condition) PROCESS - Special Needs []  - Pediatric / Minor Patient Management 0 []  - 0 Isolation Patient Management []  - 0 Hearing / Language / Visual special needs []  - 0 Assessment of Community assistance (transportation, D/C planning, etc.) []  - 0 Additional assistance / Altered mentation []  - 0 Support Surface(s) Assessment (bed, cushion, seat, etc.) INTERVENTIONS - Miscellaneous []  - External ear exam 0 []  - 0 Patient Transfer (multiple staff / / Similar devices) []  - 0 Simple Staple / Suture removal (25 or less) []  - 0 Complex Staple / Suture removal (26 or more) []  - 0 Hypo/Hyperglycemic Management (do not check if billed separately) []  - 0 Ankle / Brachial Index (ABI) - do not  check if billed separately Has the patient been seen at the hospital within the  last three years: Yes Total Score: 0 Level Of Care: ____ Miguel Harvey ([id number]) Electronic Signature(s) Signed: 08/01/2020 5:11:22 PM By: Phillis Haggis, Dondra Prader RN Entered By: Phillis Haggis, Dondra Prader on 08/01/2020 14:36:20 Miguel Harvey ([id number]) -------------------------------------------------------------------------------- Encounter Discharge Information Details Patient Name: Miguel Harvey Date of Service: 08/01/2020 1:00 PM Medical Record Number: [id number] Patient Account Number: [id number] Date of Birth/Sex: [birth year] (74 y.o. M) Treating RN: Rogers Blocker Primary Care Kohei Antonellis: [name] Other Clinician: Referring Fronia Depass: [name] Treating Livvy Spilman/Extender: Rowan Blase in Treatment: 3 Encounter Discharge Information Items Post Procedure Vitals Discharge Condition: Stable Temperature (F): 98.4 Ambulatory Status: Wheelchair Pulse (bpm): 80 Discharge Destination: Skilled Nursing Facility Respiratory Rate (breaths/min): 18 Telephoned: Yes Blood Pressure (mmHg): 135/77 Orders Sent: Yes Transportation: Private Auto Accompanied By: self Schedule Follow-up Appointment: Yes Clinical Summary of Care: Electronic Signature(s) Signed: 08/01/2020 4:47:27 PM By: Lolita Cram Entered By: Lolita Cram on 08/01/2020 14:44:24 Miguel Harvey ([id number]) -------------------------------------------------------------------------------- Lower Extremity Assessment Details Patient Name: Miguel Harvey Date of Service: 08/01/2020 1:00 PM Medical Record Number: [id number] Patient Account Number: [id number] Date of Birth/Sex: 12/11/1945 (74 y.o. M) Treating RN: Yevonne Pax Primary Care Safari Cinque: [name] Other Clinician: Referring Demetrius Mahler: [name] Treating Jaskiran Pata/Extender: Allen Derry Weeks in Treatment: 3 Edema Assessment Assessed: [Left: No] [Right: No] Edema: [Left: Ye]  [Right: s] Vascular Assessment Pulses: Dorsalis Pedis Palpable: [Left:Yes] Electronic Signature(s) Signed: 08/12/2020 8:33:59 AM By: Yevonne Pax RN Entered By: Yevonne Pax on 08/01/2020 13:36:00 Miguel Harvey ([id number]) -------------------------------------------------------------------------------- Multi Wound Chart Details Patient Name: Miguel Harvey Date of Service: 08/01/2020 1:00 PM Medical Record Number: [id number] Patient Account Number: [id number] Date of Birth/Sex: 30-Dec-1945 (74 y.o. M) Treating RN: Rogers Blocker Primary Care Alliene Klugh: [name] Other Clinician: Referring Jahlani Lorentz: [name] Treating Jatavis Malek/Extender: Rowan Blase in Treatment: 3 Vital Signs Height(in): Pulse(bpm): 80 Weight(lbs): Blood Pressure(mmHg): 135/77 Body Mass Index(BMI): Temperature(F): 98.4 Respiratory Rate(breaths/min): 18 Photos: [N/A:N/A] Wound Location: Left, Midline Calcaneus N/A N/A Wounding Event: Trauma N/A N/A Primary Etiology: Diabetic Wound/Ulcer of the Lower N/A N/A Extremity Comorbid History: Cataracts, Chronic Obstructive N/A N/A Pulmonary Disease (COPD), Congestive Heart Failure, Coronary Artery Disease, Hypertension, Type II Diabetes Date Acquired: 06/07/2020 N/A N/A Weeks of Treatment: 3 N/A N/A Wound Status: Open N/A N/A Measurements L x W x D (cm) 0.4x0.7x0.1 N/A N/A Area (cm) : 0.22 N/A N/A Volume (cm) : 0.022 N/A N/A % Reduction in Area: 89.60% N/A N/A % Reduction in Volume: 96.50% N/A N/A Classification: Grade 2 N/A N/A Exudate Amount: None Present N/A N/A Wound Margin: Distinct, outline attached N/A N/A Granulation Amount: None Present (0%) N/A N/A Necrotic Amount: Large (67-100%) N/A N/A Necrotic Tissue: Eschar, Adherent Slough N/A N/A Exposed Structures: Fascia: No N/A N/A Fat Layer (Subcutaneous Tissue): No Tendon: No Muscle: No Joint: No Bone: No Epithelialization: Large (67-100%) N/A N/A Treatment Notes Electronic  Signature(s) Signed: 08/01/2020 5:11:22 PM By: Phillis Haggis, Dondra Prader RN Entered By: Phillis Haggis, Dondra Prader on 08/01/2020 14:29:20 Miguel Harvey ([id number]) -------------------------------------------------------------------------------- Multi-Disciplinary Care Plan Details Patient Name: Miguel Harvey Date of Service: 08/01/2020 1:00 PM Medical Record Number: [id number] Patient Account Number: [id number] Date of Birth/Sex: 1945-11-09 (74 y.o. M) Treating RN: Rogers Blocker Primary Care Manilla Strieter: [name] Other Clinician: Referring Danyle Boening: [name] Treating Quince Santana/Extender: Rowan Blase in Treatment: 3 Active Inactive Abuse / Safety / Falls / Self Care Management Nursing Diagnoses: Potential for injury related to falls Goals: Patient will not experience any injury related to falls Date Initiated: 07/08/2020 Target  Resolution Date: 08/07/2020 Goal Status: Active Interventions: Assess Activities of Daily Living upon admission and as needed Assess fall risk on admission and as needed Assess: immobility, friction, shearing, incontinence upon admission and as needed Assess impairment of mobility on admission and as needed per policy Assess personal safety and home safety (as indicated) on admission and as needed Assess self care needs on admission and as needed Notes: Nutrition Nursing Diagnoses: Impaired glucose control: actual or potential Goals: Patient/caregiver verbalizes understanding of need to maintain therapeutic glucose control per primary care physician Date Initiated: 07/08/2020 Target Resolution Date: 08/07/2020 Goal Status: Active Interventions: Assess HgA1c results as ordered upon admission and as needed Assess patient nutrition upon admission and as needed per policy Notes: Wound/Skin Impairment Nursing Diagnoses: Knowledge deficit related to ulceration/compromised skin integrity Goals: Patient/caregiver will verbalize understanding of skin care  regimen Date Initiated: 07/08/2020 Target Resolution Date: 08/07/2020 Goal Status: Active Ulcer/skin breakdown will have a volume reduction of 30% by week 4 Date Initiated: 07/08/2020 Target Resolution Date: 08/07/2020 Goal Status: Active Ulcer/skin breakdown will have a volume reduction of 50% by week 8 Date Initiated: 07/08/2020 Target Resolution Date: 09/07/2020 Goal Status: Active Ulcer/skin breakdown will have a volume reduction of 80% by week 12 Date Initiated: 07/08/2020 Target Resolution Date: 10/07/2020 Miguel Harvey, Miguel Harvey ([id number]) Goal Status: Active Ulcer/skin breakdown will heal within 14 weeks Date Initiated: 07/08/2020 Target Resolution Date: 11/07/2020 Goal Status: Active Interventions: Assess patient/caregiver ability to obtain necessary supplies Assess patient/caregiver ability to perform ulcer/skin care regimen upon admission and as needed Assess ulceration(s) every visit Notes: Electronic Signature(s) Signed: 08/01/2020 5:11:22 PM By: Phillis Haggis, Dondra Prader RN Entered By: Phillis Haggis, Dondra Prader on 08/01/2020 14:29:10 Miguel Harvey ([id number]) -------------------------------------------------------------------------------- Pain Assessment Details Patient Name: Miguel Harvey Date of Service: 08/01/2020 1:00 PM Medical Record Number: [id number] Patient Account Number: [id number] Date of Birth/Sex: 06-19-1945 (74 y.o. M) Treating RN: Yevonne Pax Primary Care Stelios Kirby: [name] Other Clinician: Referring Tamir Wallman: [name] Treating Lou Irigoyen/Extender: Rowan Blase in Treatment: 3 Active Problems Location of Pain Severity and Description of Pain Patient Has Paino No Site Locations Pain Management and Medication Current Pain Management: Electronic Signature(s) Signed: 08/12/2020 8:33:59 AM By: Yevonne Pax RN Entered By: Yevonne Pax on 08/01/2020 13:30:41 Miguel Harvey  ([id number]) -------------------------------------------------------------------------------- Patient/Caregiver Education Details Patient Name: Miguel Harvey Date of Service: 08/01/2020 1:00 PM Medical Record Number: [id number] Patient Account Number: [id number] Date of Birth/Gender: Mar 26, 1946 (74 y.o. M) Treating RN: Rogers Blocker Primary Care Physician: [name] Other Clinician: Referring Physician: [name] Treating Physician/Extender: Rowan Blase in Treatment: 3 Education Assessment Education Provided To: Patient Education Topics Provided Wound Debridement: Methods: Explain/Verbal Responses: State content correctly Wound/Skin Impairment: Methods: Demonstration, Explain/Verbal Responses: State content correctly Electronic Signature(s) Signed: 08/01/2020 5:11:22 PM By: Phillis Haggis, Dondra Prader RN Entered By: Phillis Haggis, Dondra Prader on 08/01/2020 14:36:46 Miguel Harvey ([id number]) -------------------------------------------------------------------------------- Wound Assessment Details Patient Name: Miguel Harvey Date of Service: 08/01/2020 1:00 PM Medical Record Number: [id number] Patient Account Number: [id number] Date of Birth/Sex: 01/21/1946 (74 y.o. M) Treating RN: Yevonne Pax Primary Care Edmonia Gonser: [name] Other Clinician: Referring Doreather Hoxworth: [name] Treating Catlyn Shipton/Extender: Allen Derry Weeks in Treatment: 3 Wound Status Wound Number: 1 Primary Diabetic Wound/Ulcer of the Lower Extremity Etiology: Wound Location: Left, Midline Calcaneus Wound Open Wounding Event: Trauma Status: Date Acquired: 06/07/2020 Comorbid Cataracts, Chronic Obstructive Pulmonary Disease (COPD), Weeks Of Treatment: 3 History: Congestive Heart Failure, Coronary Artery Disease, Clustered Wound: No Hypertension, Type II Diabetes Photos Wound Measurements  Length: (cm) 0.4 Width: (cm) 0.7 Depth: (cm) 0.1 Area: (cm) 0.22 Volume: (cm) 0.022 % Reduction in Area: 89.6% %  Reduction in Volume: 96.5% Epithelialization: Large (67-100%) Tunneling: No Undermining: No Wound Description Classification: Grade 2 Wound Margin: Distinct, outline attached Exudate Amount: None Present Foul Odor After Cleansing: No Slough/Fibrino Yes Wound Bed Granulation Amount: None Present (0%) Exposed Structure Necrotic Amount: Large (67-100%) Fascia Exposed: No Necrotic Quality: Eschar, Adherent Slough Fat Layer (Subcutaneous Tissue) Exposed: No Tendon Exposed: No Muscle Exposed: No Joint Exposed: No Bone Exposed: No Electronic Signature(s) Signed: 08/12/2020 8:33:59 AM By: Yevonne Pax RN Entered By: Yevonne Pax on 08/01/2020 13:35:32 Miguel Harvey ([id number]) -------------------------------------------------------------------------------- Vitals Details Patient Name: Miguel Harvey Date of Service: 08/01/2020 1:00 PM Medical Record Number: [id number] Patient Account Number: [id number] Date of Birth/Sex: 10/04/1945 (74 y.o. M) Treating RN: Yevonne Pax Primary Care Dajanique Robley: [name] Other Clinician: Referring Nakota Elsen: [name] Treating Lanina Larranaga/Extender: Rowan Blase in Treatment: 3 Vital Signs Time Taken: 13:30 Temperature (F): 98.4 Pulse (bpm): 80 Respiratory Rate (breaths/min): 18 Blood Pressure (mmHg): 135/77 Reference Range: 80 - 120 mg / dl Electronic Signature(s) Signed: 08/12/2020 8:33:59 AM By: Yevonne Pax RN Entered By: Yevonne Pax on 08/01/2020 13:30:36

## 2020-08-08 ENCOUNTER — Encounter: Payer: Medicare Other | Admitting: Physician Assistant

## 2020-08-08 ENCOUNTER — Other Ambulatory Visit: Payer: Self-pay

## 2020-08-08 DIAGNOSIS — E11621 Type 2 diabetes mellitus with foot ulcer: Secondary | ICD-10-CM | POA: Diagnosis not present

## 2020-08-08 NOTE — Progress Notes (Addendum)
JACKSYN, BEEKS ([id number]) Visit Report for 08/08/2020 Chief Complaint Document Details Patient Name: Miguel Harvey, Miguel Harvey Date of Service: 08/08/2020 2:30 PM Medical Record Number: [id number] Patient Account Number: [id number] Date of Birth/Sex: 1945/09/16 (75 y.o. M) Treating RN: Rogers Blocker Primary Care Provider: [name] Other Clinician: Referring Provider: [name] Treating Provider/Extender: Rowan Blase in Treatment: 4 Information Obtained from: Patient Chief Complaint Left heel ulcer Electronic Signature(s) Signed: 08/08/2020 2:30:58 PM By: Lenda Kelp PA-C Entered By: Lenda Kelp on 08/08/2020 14:30:58 Miguel Harvey ([id number]) -------------------------------------------------------------------------------- HPI Details Patient Name: Miguel Harvey Date of Service: 08/08/2020 2:30 PM Medical Record Number: [id number] Patient Account Number: [id number] Date of Birth/Sex: 21-Mar-1946 (75 y.o. M) Treating RN: Rogers Blocker Primary Care Provider: [name] Other Clinician: Referring Provider: [name] Treating Provider/Extender: Rowan Blase in Treatment: 4 History of Present Illness HPI Description: 07/08/20 patient presents today for initial inspection here in our clinic concerning issues that he is been having with his left heel for about a month he tells me. To be honest the wound looks like it is probably been there longer than a month but I cannot be 100% sure of that. Fortunately there does not appear to be any signs of infection. We did perform arterial studies today and that was also somewhat low as far as the reading was concerned at 0.63. He does have a known history of peripheral vascular disease. He has diabetes mellitus type 2, hypertension, coronary artery disease, peripheral vascular disease, and is on long-term anticoagulant therapy. Specifically Plavix. Right now the wound does appear to be showing some signs of healing which is good news.  Nonetheless given his poor arterial flow I do want him to see [facility] for a formal arterial study with ABI and TBI to further evaluate what is going on here. 07/15/2020 upon evaluation today patient appears to be doing excellent in regard to his heel ulcer. He was seen by vascular. Unfortunately we do not have the report documented yet that I can review though the patient tells me that the provider said that he had good blood flow. with that being said right now he seems to be doing excellent and is making great progress. Overall I think the wound is headed in the right direction. 08/01/2020 evaluation today patient appears to be doing well with regard to his wound. Fortunately there is no signs that the moment that there is any evidence of infection which is good news. No fevers, chills, nausea, vomiting, or diarrhea. With that being said he has a lot of callus and he tells me that the dressing was only changed once since he was last here. Again I have no way to independently verify that that if that is true that does disappoint me. 08/08/2020 upon evaluation today patient appears to be doing excellent in regard to his wound on the heel. In fact I think that he is actually making great progress. The biggest issue I see here is simply that the patient probably needs to have some moisturizer in order to keep this under control. It looks like the area is extremely dry. With that being said I think that he may need to just do this on his own as apparently the facility is not getting much help with anything dressing change wise. Electronic Signature(s) Signed: 08/08/2020 6:29:50 PM By: Lenda Kelp PA-C Entered By: Lenda Kelp on 08/08/2020 18:29:50 Miguel Harvey ([id number]) -------------------------------------------------------------------------------- Physical Exam Details Patient Name:  Miguel Harvey Date of Service: 08/08/2020 2:30 PM Medical Record Number:  [id number] Patient Account Number: [id number] Date of Birth/Sex: [birth year] (75 y.o. M) Treating RN: Rogers Blocker Primary Care Provider: [name] Other Clinician: Referring Provider: [name] Treating Provider/Extender: Allen Derry Weeks in Treatment: 4 Constitutional Well-nourished and well-hydrated in no acute distress. Respiratory normal breathing without difficulty. Psychiatric this patient is able to make decisions and demonstrates good insight into disease process. Alert and Oriented x 3. pleasant and cooperative. Notes Upon inspection patient's wound bed actually showed signs of good granulation epithelization at this point. There does not appear to be any evidence of active infection which is great news and overall very pleased with where things stand today. No fevers, chills, nausea, vomiting, or diarrhea. Electronic Signature(s) Signed: 08/08/2020 6:30:44 PM By: Lenda Kelp PA-C Entered By: Lenda Kelp on 08/08/2020 18:30:43 Miguel Harvey ([id number]) -------------------------------------------------------------------------------- Physician Orders Details Patient Name: Miguel Harvey Date of Service: 08/08/2020 2:30 PM Medical Record Number: [id number] Patient Account Number: [id number] Date of Birth/Sex: [birth year] (75 y.o. M) Treating RN: Rogers Blocker Primary Care Provider: [name] Other Clinician: Referring Provider: [name] Treating Provider/Extender: Rowan Blase in Treatment: 4 Verbal / Phone Orders: No Diagnosis Coding ICD-10 Coding Code Description E11.621 Type 2 diabetes mellitus with foot ulcer L97.422 Non-pressure chronic ulcer of left heel and midfoot with fat layer exposed I10 Essential (primary) hypertension I25.10 Atherosclerotic heart disease of native coronary artery without angina pectoris I73.89 Other specified peripheral vascular diseases Z79.01 Long term (current) use of anticoagulants Follow-up Appointments o Return  Appointment in 1 week. Edema Control - Lymphedema / Segmental Compressive Device / Other o Elevate, Exercise Daily and Avoid Standing for Long Periods of Time. o Elevate legs to the level of the heart and pump ankles as often as possible o Elevate leg(s) parallel to the floor when sitting. Wound Treatment Wound #1 - Calcaneus Wound Laterality: Left, Midline Peri-Wound Care: Moisturizing Lotion 2 x Per Day/15 Days Discharge Instructions: Apply to area twice a day-no dressing needed Electronic Signature(s) Signed: 08/08/2020 5:06:40 PM By: Phillis Haggis, Dondra Prader RN Signed: 08/08/2020 8:15:19 PM By: Lenda Kelp PA-C Entered By: Phillis Haggis, Dondra Prader on 08/08/2020 14:35:42 Miguel Harvey, Miguel Harvey ([id number]) -------------------------------------------------------------------------------- Problem List Details Patient Name: Miguel Harvey Date of Service: 08/08/2020 2:30 PM Medical Record Number: [id number] Patient Account Number: [id number] Date of Birth/Sex: 04/12/1945 (74 y.o. M) Treating RN: Rogers Blocker Primary Care Provider: [name] Other Clinician: Referring Provider: [name] Treating Provider/Extender: Allen Derry Weeks in Treatment: 4 Active Problems ICD-10 Encounter Code Description Active Date MDM Diagnosis E11.621 Type 2 diabetes mellitus with foot ulcer 07/08/2020 No Yes L97.422 Non-pressure chronic ulcer of left heel and midfoot with fat layer 07/08/2020 No Yes exposed I10 Essential (primary) hypertension 07/08/2020 No Yes I25.10 Atherosclerotic heart disease of native coronary artery without angina 07/08/2020 No Yes pectoris I73.89 Other specified peripheral vascular diseases 07/08/2020 No Yes Z79.01 Long term (current) use of anticoagulants 07/08/2020 No Yes Inactive Problems Resolved Problems Electronic Signature(s) Signed: 08/08/2020 2:30:52 PM By: Lenda Kelp PA-C Entered By: Lenda Kelp on 08/08/2020 14:30:52 Miguel Harvey  ([id number]) -------------------------------------------------------------------------------- Progress Note Details Patient Name: Miguel Harvey Date of Service: 08/08/2020 2:30 PM Medical Record Number: [id number] Patient Account Number: [id number] Date of Birth/Sex: 1945/06/15 (74 y.o. M) Treating RN: Rogers Blocker Primary Care Provider: [name] Other Clinician: Referring Provider: [name] Treating Provider/Extender: Rowan Blase in Treatment: 4 Subjective Chief Complaint Information obtained  from Patient Left heel ulcer History of Present Illness (HPI) 07/08/20 patient presents today for initial inspection here in our clinic concerning issues that he is been having with his left heel for about a month he tells me. To be honest the wound looks like it is probably been there longer than a month but I cannot be 100% sure of that. Fortunately there does not appear to be any signs of infection. We did perform arterial studies today and that was also somewhat low as far as the reading was concerned at 0.63. He does have a known history of peripheral vascular disease. He has diabetes mellitus type 2, hypertension, coronary artery disease, peripheral vascular disease, and is on long-term anticoagulant therapy. Specifically Plavix. Right now the wound does appear to be showing some signs of healing which is good news. Nonetheless given his poor arterial flow I do want him to see [facility] for a formal arterial study with ABI and TBI to further evaluate what is going on here. 07/15/2020 upon evaluation today patient appears to be doing excellent in regard to his heel ulcer. He was seen by vascular. Unfortunately we do not have the report documented yet that I can review though the patient tells me that the provider said that he had good blood flow. with that being said right now he seems to be doing excellent and is making great progress. Overall I think the wound is  headed in the right direction. 08/01/2020 evaluation today patient appears to be doing well with regard to his wound. Fortunately there is no signs that the moment that there is any evidence of infection which is good news. No fevers, chills, nausea, vomiting, or diarrhea. With that being said he has a lot of callus and he tells me that the dressing was only changed once since he was last here. Again I have no way to independently verify that that if that is true that does disappoint me. 08/08/2020 upon evaluation today patient appears to be doing excellent in regard to his wound on the heel. In fact I think that he is actually making great progress. The biggest issue I see here is simply that the patient probably needs to have some moisturizer in order to keep this under control. It looks like the area is extremely dry. With that being said I think that he may need to just do this on his own as apparently the facility is not getting much help with anything dressing change wise. Objective Constitutional Well-nourished and well-hydrated in no acute distress. Vitals Time Taken: 2:18 PM, Temperature: 98.4 F, Pulse: 88 bpm, Respiratory Rate: 18 breaths/min, Blood Pressure: 138/74 mmHg. Respiratory normal breathing without difficulty. Psychiatric this patient is able to make decisions and demonstrates good insight into disease process. Alert and Oriented x 3. pleasant and cooperative. General Notes: Upon inspection patient's wound bed actually showed signs of good granulation epithelization at this point. There does not appear to be any evidence of active infection which is great news and overall very pleased with where things stand today. No fevers, chills, nausea, vomiting, or diarrhea. Integumentary (Hair, Skin) Wound #1 status is Open. Original cause of wound was Trauma. The date acquired was: 06/07/2020. The wound has been in treatment 4 weeks. The wound is located on the Left,Midline Calcaneus.  The wound measures 0.2cm length x 0.1cm width x 0.1cm depth; 0.016cm^2 area and 0.002cm^3 volume. There is no tunneling or undermining noted. There is a none present amount of  drainage noted. The wound margin is distinct with the outline attached to the wound base. There is no granulation within the wound bed. There is a large (67-100%) amount of necrotic tissue within the wound bed including Eschar. Miguel Harvey, Miguel Harvey ([id number]) Assessment Active Problems ICD-10 Type 2 diabetes mellitus with foot ulcer Non-pressure chronic ulcer of left heel and midfoot with fat layer exposed Essential (primary) hypertension Atherosclerotic heart disease of native coronary artery without angina pectoris Other specified peripheral vascular diseases Long term (current) use of anticoagulants Plan Follow-up Appointments: Return Appointment in 1 week. Edema Control - Lymphedema / Segmental Compressive Device / Other: Elevate, Exercise Daily and Avoid Standing for Long Periods of Time. Elevate legs to the level of the heart and pump ankles as often as possible Elevate leg(s) parallel to the floor when sitting. WOUND #1: - Calcaneus Wound Laterality: Left, Midline Peri-Wound Care: Moisturizing Lotion 2 x Per Day/15 Days Discharge Instructions: Apply to area twice a day-no dressing needed 1. Recommend that we go ahead and continue with the monitoring of the patient's wound for 1 more week. I am hopeful that he will be healed by next week. I think he might actually be right now. Nonetheless I would have him place lotion over the area in order to keep it moist over the next week we will see how things do. 2. I am also can recommend that we continue to monitor for any signs of opening or infection if he has any issues in that regard he should let me know soon as possible as well. We will see patient back for reevaluation in 1 week here in the clinic. If anything worsens or changes patient will contact our office  for additional recommendations. Electronic Signature(s) Signed: 08/08/2020 6:31:35 PM By: Lenda Kelp PA-C Entered By: Lenda Kelp on 08/08/2020 18:31:35 Miguel Harvey ([id number]) -------------------------------------------------------------------------------- SuperBill Details Patient Name: Miguel Harvey Date of Service: 08/08/2020 Medical Record Number: [id number] Patient Account Number: [id number] Date of Birth/Sex: Aug 12, 1945 (74 y.o. M) Treating RN: Rogers Blocker Primary Care Provider: [name] Other Clinician: Referring Provider: [name] Treating Provider/Extender: Rowan Blase in Treatment: 4 Diagnosis Coding ICD-10 Codes Code Description [phone] Type 2 diabetes mellitus with foot ulcer L97.422 Non-pressure chronic ulcer of left heel and midfoot with fat layer exposed I10 Essential (primary) hypertension I25.10 Atherosclerotic heart disease of native coronary artery without angina pectoris I73.89 Other specified peripheral vascular diseases Z79.01 Long term (current) use of anticoagulants Facility Procedures CPT4 Code: [id number] Description: [phone] - WOUND CARE VISIT-LEV 2 EST PT Modifier: Quantity: 1 Physician Procedures CPT4 Code: [id number] Description: [id number] - WC PHYS LEVEL 3 - EST PT Modifier: Quantity: 1 CPT4 Code: Description: ICD-10 Diagnosis Description E11.621 Type 2 diabetes mellitus with foot ulcer L97.422 Non-pressure chronic ulcer of left heel and midfoot with fat layer exp I10 Essential (primary) hypertension I25.10 Atherosclerotic heart disease of native  coronary artery without angina Modifier: osed pectoris Quantity: Electronic Signature(s) Signed: 08/08/2020 6:43:53 PM By: Lenda Kelp PA-C Previous Signature: 08/08/2020 5:06:40 PM Version By: Phillis Haggis, Dondra Prader RN Entered By: Lenda Kelp on 08/08/2020 18:43:53

## 2020-08-09 NOTE — Progress Notes (Signed)
Miguel Harvey, Miguel Harvey ([id number]) Visit Report for 08/08/2020 Arrival Information Details Patient Name: Miguel Harvey, Miguel Harvey Date of Service: 08/08/2020 2:30 PM Medical Record Number: [id number] Patient Account Number: [id number] Date of Birth/Sex: May 31, 1945 (75 y.o. M) Treating RN: Miguel Harvey Primary Care Miguel Harvey: Miguel Harvey Other Clinician: Referring Miguel Harvey: Miguel Harvey Treating Miguel Harvey/Extender: Miguel Harvey in Treatment: 4 Visit Information History Since Last Visit Added or deleted any medications: No Patient Arrived: Wheel Chair Had a fall or experienced change in No Arrival Time: 14:13 activities of daily living that may affect Accompanied By: self risk of falls: Transfer Assistance: EasyPivot Patient Lift Hospitalized since last visit: No Patient Identification Verified: Yes Has Dressing in Place as Prescribed: No Secondary Verification Process Completed: Yes Pain Present Now: No Patient Has Alerts: Yes Patient Alerts: Patient on Blood Thinner Type II Diabetic 81MG  aspirin Plavix Electronic Signature(s) Signed: 08/09/2020 10:34:15 AM By: 08/11/2020 Entered By: Miguel Harvey on 08/08/2020 14:16:49 10/08/2020 (Miguel Harvey) -------------------------------------------------------------------------------- Clinic Level of Care Assessment Details Patient Name: [id number] Date of Service: 08/08/2020 2:30 PM Medical Record Number: 10/08/2020 Patient Account Number: [id number] Date of Birth/Sex: 04-15-1945 (74 y.o. M) Treating RN: 12-28-1970 Primary Care Miguel Harvey: Miguel Harvey Other Clinician: Referring Miguel Harvey: Miguel Harvey Treating Miguel Harvey/Extender: Miguel Harvey in Treatment: 4 Clinic Level of Care Assessment Items TOOL 4 Quantity Score X - Use when only an EandM is performed on FOLLOW-UP visit 1 0 ASSESSMENTS - Nursing Assessment / Reassessment X - Reassessment of Co-morbidities (includes updates in patient status) 1 10 X- 1 5 Reassessment of Adherence to  Treatment Plan ASSESSMENTS - Wound and Skin Assessment / Reassessment X - Simple Wound Assessment / Reassessment - one wound 1 5 []  - 0 Complex Wound Assessment / Reassessment - multiple wounds []  - 0 Dermatologic / Skin Assessment (not related to wound area) ASSESSMENTS - Focused Assessment []  - Circumferential Edema Measurements - multi extremities 0 []  - 0 Nutritional Assessment / Counseling / Intervention []  - 0 Lower Extremity Assessment (monofilament, tuning fork, pulses) []  - 0 Peripheral Arterial Disease Assessment (using hand held doppler) ASSESSMENTS - Ostomy and/or Continence Assessment and Care []  - Incontinence Assessment and Management 0 []  - 0 Ostomy Care Assessment and Management (repouching, etc.) PROCESS - Coordination of Care X - Simple Patient / Family Education for ongoing care 1 15 []  - 0 Complex (extensive) Patient / Family Education for ongoing care []  - 0 Staff obtains Miguel Harvey, Records, Test Results / Process Orders []  - 0 Staff telephones HHA, Nursing Homes / Clarify orders / etc []  - 0 Routine Transfer to another Facility (non-emergent condition) []  - 0 Routine Hospital Admission (non-emergent condition) []  - 0 New Admissions / / Ordering NPWT, Apligraf, etc. []  - 0 Emergency Hospital Admission (emergent condition) X- 1 10 Simple Discharge Coordination []  - 0 Complex (extensive) Discharge Coordination PROCESS - Special Needs []  - Pediatric / Minor Patient Management 0 []  - 0 Isolation Patient Management []  - 0 Hearing / Language / Visual special needs []  - 0 Assessment of Community assistance (transportation, D/C planning, etc.) []  - 0 Additional assistance / Altered mentation []  - 0 Support Surface(s) Assessment (bed, cushion, seat, etc.) INTERVENTIONS - Wound Cleansing / Measurement Miguel, Harvey. ( ) X- 1 5 Simple Wound Cleansing - one wound []  - 0 Complex Wound Cleansing - multiple wounds X- 1  5 Wound Imaging (photographs - any number of wounds) []  - 0 Wound Tracing (instead of photographs) X- 1 5 Simple  Wound Measurement - one wound []  - 0 Complex Wound Measurement - multiple wounds INTERVENTIONS - Wound Dressings []  - Small Wound Dressing one or multiple wounds 0 []  - 0 Medium Wound Dressing one or multiple wounds []  - 0 Large Wound Dressing one or multiple wounds X- 1 5 Application of Medications - topical []  - 0 Application of Medications - injection INTERVENTIONS - Miscellaneous []  - External ear exam 0 []  - 0 Specimen Collection (cultures, biopsies, blood, body fluids, etc.) []  - 0 Specimen(s) / Culture(s) sent or taken to Lab for analysis []  - 0 Patient Transfer (multiple staff / / Similar devices) []  - 0 Simple Staple / Suture removal (25 or less) []  - 0 Complex Staple / Suture removal (26 or more) []  - 0 Hypo / Hyperglycemic Management (close monitor of Blood Glucose) []  - 0 Ankle / Brachial Index (ABI) - do not check if billed separately X- 1 5 Vital Signs Has the patient been seen at the hospital within the last three years: Yes Total Score: 70 Level Of Care: New/Established - Level 2 Electronic Signature(s) Signed: 08/08/2020 5:06:40 PM By: , RN Entered By: , on 08/08/2020 14:36:15 ( ) -------------------------------------------------------------------------------- Lower Extremity Assessment Details Patient Name: Nurse, adult Date of Service: 08/08/2020 2:30 PM Medical Record Number: Patient Account Number: Date of Birth/Sex: 09-21-1945 (74 y.o. M) Treating RN: Phillis Haggis Primary Care Quintina Hakeem: Dondra Prader Other Clinician: Referring Jermiah Soderman: Phillis Haggis Treating Trulee Hamstra/Extender: Dondra Prader Weeks in Treatment: 4 Edema Assessment Assessed: [Left: Yes] [Right: No] [Left: Edema] [Right: :] Vascular Assessment Pulses: Dorsalis  Pedis Palpable: [Left:Yes] Electronic Signature(s) Signed: 08/09/2020 10:34:15 AM By: Miguel Harvey Entered By: [id number] on 08/08/2020 14:23:14 10/08/2020 ([id number]) -------------------------------------------------------------------------------- Multi Wound Chart Details Patient Name: [id number] Date of Service: 08/08/2020 2:30 PM Medical Record Number: 12-28-1970 Patient Account Number: Miguel Harvey Date of Birth/Sex: April 22, 1945 (74 y.o. M) Treating RN: Allen Derry Primary Care Seiya Silsby: 08/11/2020 Other Clinician: Referring Kaliyah Gladman: Miguel Harvey Treating Lucynda Rosano/Extender: Miguel Harvey in Treatment: 4 Vital Signs Height(in): Pulse(bpm): 88 Weight(lbs): Blood Pressure(mmHg): 138/74 Body Mass Index(BMI): Temperature(F): 98.4 Respiratory Rate(breaths/min): 18 Photos: [N/A:N/A] Wound Location: Left, Midline Calcaneus N/A N/A Wounding Event: Trauma N/A N/A Primary Etiology: Diabetic Wound/Ulcer of the Lower N/A N/A Extremity Comorbid History: Cataracts, Chronic Obstructive N/A N/A Pulmonary Disease (COPD), Congestive Heart Failure, Coronary Artery Disease, Hypertension, Type II Diabetes Date Acquired: 06/07/2020 N/A N/A Weeks of Treatment: 4 N/A N/A Wound Status: Open N/A N/A Measurements L x W x D (cm) 0.2x0.1x0.1 N/A N/A Area (cm) : 0.016 N/A N/A Volume (cm) : 0.002 N/A N/A % Reduction in Area: 99.20% N/A N/A % Reduction in Volume: 99.70% N/A N/A Classification: Grade 2 N/A N/A Exudate Amount: None Present N/A N/A Wound Margin: Distinct, outline attached N/A N/A Granulation Amount: None Present (0%) N/A N/A Necrotic Amount: Large (67-100%) N/A N/A Necrotic Tissue: Eschar N/A N/A Exposed Structures: Fascia: No N/A N/A Fat Layer (Subcutaneous Tissue): No Tendon: No Muscle: No Joint: No Bone: No Epithelialization: Large (67-100%) N/A N/A Treatment Notes Electronic Signature(s) Signed: 08/08/2020 5:06:40 PM By: [id number], Miguel Hong RN Entered  By: 10/08/2020, [id number] on 08/08/2020 14:33:03 03/30/1946 (12-28-1970) -------------------------------------------------------------------------------- Multi-Disciplinary Care Plan Details Patient Name: Miguel Harvey Date of Service: 08/08/2020 2:30 PM Medical Record Number: Miguel Harvey Patient Account Number: Miguel Harvey Date of Birth/Sex: [birth year] (74 y.o. M) Treating RN: Phillis Haggis Primary Care Felita Bump: Dondra Prader Other Clinician: Referring  Evamaria Detore: Miguel Harvey Treating Oronde Hallenbeck/Extender: Miguel Harvey in Treatment: 4 Active Inactive Abuse / Safety / Falls / Self Care Management Nursing Diagnoses: Potential for injury related to falls Goals: Patient will not experience any injury related to falls Date Initiated: 07/08/2020 Target Resolution Date: 08/07/2020 Goal Status: Active Interventions: Assess Activities of Daily Living upon admission and as needed Assess fall risk on admission and as needed Assess: immobility, friction, shearing, incontinence upon admission and as needed Assess impairment of mobility on admission and as needed per policy Assess personal safety and home safety (as indicated) on admission and as needed Assess self care needs on admission and as needed Notes: Nutrition Nursing Diagnoses: Impaired glucose control: actual or potential Goals: Patient/caregiver verbalizes understanding of need to maintain therapeutic glucose control per primary care physician Date Initiated: 07/08/2020 Target Resolution Date: 08/07/2020 Goal Status: Active Interventions: Assess HgA1c results as ordered upon admission and as needed Assess patient nutrition upon admission and as needed per policy Notes: Wound/Skin Impairment Nursing Diagnoses: Knowledge deficit related to ulceration/compromised skin integrity Goals: Patient/caregiver will verbalize understanding of skin care regimen Date Initiated: 07/08/2020 Target Resolution Date: 08/07/2020 Goal Status:  Active Ulcer/skin breakdown will have a volume reduction of 30% by week 4 Date Initiated: 07/08/2020 Target Resolution Date: 08/07/2020 Goal Status: Active Ulcer/skin breakdown will have a volume reduction of 50% by week 8 Date Initiated: 07/08/2020 Target Resolution Date: 09/07/2020 Goal Status: Active Ulcer/skin breakdown will have a volume reduction of 80% by week 12 Date Initiated: 07/08/2020 Target Resolution Date: 10/07/2020 Miguel Harvey, Miguel Harvey ([id number]) Goal Status: Active Ulcer/skin breakdown will heal within 14 weeks Date Initiated: 07/08/2020 Target Resolution Date: 11/07/2020 Goal Status: Active Interventions: Assess patient/caregiver ability to obtain necessary supplies Assess patient/caregiver ability to perform ulcer/skin care regimen upon admission and as needed Assess ulceration(s) every visit Notes: Electronic Signature(s) Signed: 08/08/2020 5:06:40 PM By: Phillis Haggis, Dondra Prader RN Entered By: Phillis Haggis, Dondra Prader on 08/08/2020 14:32:52 Miguel Harvey ([id number]) -------------------------------------------------------------------------------- Pain Assessment Details Patient Name: Miguel Harvey Date of Service: 08/08/2020 2:30 PM Medical Record Number: [id number] Patient Account Number: [id number] Date of Birth/Sex: Jul 03, 1945 (74 y.o. M) Treating RN: Miguel Harvey Primary Care Carden Teel: Miguel Harvey Other Clinician: Referring Cimone Fahey: Miguel Harvey Treating Malea Swilling/Extender: Miguel Harvey in Treatment: 4 Active Problems Location of Pain Severity and Description of Pain Patient Has Paino No Site Locations Rate the pain. Current Pain Level: 0 Pain Management and Medication Current Pain Management: Electronic Signature(s) Signed: 08/09/2020 10:34:15 AM By: Miguel Harvey Entered By: Miguel Harvey on 08/08/2020 14:19:29 Miguel Harvey ([id number]) -------------------------------------------------------------------------------- Patient/Caregiver Education  Details Patient Name: Miguel Harvey Date of Service: 08/08/2020 2:30 PM Medical Record Number: [id number] Patient Account Number: [id number] Date of Birth/Gender: [birth year] (74 y.o. M) Treating RN: Miguel Harvey Primary Care Physician: Miguel Harvey Other Clinician: Referring Physician: Eloisa Harvey Treating Physician/Extender: Miguel Harvey in Treatment: 4 Education Assessment Education Provided To: Patient Education Topics Provided Wound/Skin Impairment: Methods: Explain/Verbal Responses: State content correctly Electronic Signature(s) Signed: 08/08/2020 5:06:40 PM By: Phillis Haggis, Dondra Prader RN Entered By: Phillis Haggis, Dondra Prader on 08/08/2020 14:36:27 Miguel Harvey ([id number]) -------------------------------------------------------------------------------- Wound Assessment Details Patient Name: Miguel Harvey Date of Service: 08/08/2020 2:30 PM Medical Record Number: [id number] Patient Account Number: [id number] Date of Birth/Sex: [birth year] (74 y.o. M) Treating RN: Miguel Harvey Primary Care Tina Temme: Miguel Harvey Other Clinician: Referring Tamberly Pomplun: Miguel Harvey Treating Felicha Frayne/Extender: Allen Derry Weeks in Treatment: 4 Wound Status Wound Number: 1 Primary Diabetic Wound/Ulcer of the Lower Extremity Etiology: Wound  Location: Left, Midline Calcaneus Wound Open Wounding Event: Trauma Status: Date Acquired: 06/07/2020 Comorbid Cataracts, Chronic Obstructive Pulmonary Disease (COPD), Weeks Of Treatment: 4 History: Congestive Heart Failure, Coronary Artery Disease, Clustered Wound: No Hypertension, Type II Diabetes Photos Wound Measurements Length: (cm) 0.2 Width: (cm) 0.1 Depth: (cm) 0.1 Area: (cm) 0.016 Volume: (cm) 0.002 % Reduction in Area: 99.2% % Reduction in Volume: 99.7% Epithelialization: Large (67-100%) Tunneling: No Undermining: No Wound Description Classification: Grade 2 Wound Margin: Distinct, outline attached Exudate Amount: None Present Foul  Odor After Cleansing: No Slough/Fibrino Yes Wound Bed Granulation Amount: None Present (0%) Exposed Structure Necrotic Amount: Large (67-100%) Fascia Exposed: No Necrotic Quality: Eschar Fat Layer (Subcutaneous Tissue) Exposed: No Tendon Exposed: No Muscle Exposed: No Joint Exposed: No Bone Exposed: No Electronic Signature(s) Signed: 08/09/2020 10:34:15 AM By: Miguel Harvey Entered By: Miguel Harvey on 08/08/2020 14:22:36 Miguel Harvey ([id number]) -------------------------------------------------------------------------------- Vitals Details Patient Name: Miguel Harvey Date of Service: 08/08/2020 2:30 PM Medical Record Number: [id number] Patient Account Number: [id number] Date of Birth/Sex: 1945/06/24 (74 y.o. M) Treating RN: Miguel Harvey Primary Care Maki Hege: Miguel Harvey Other Clinician: Referring Emberly Tomasso: Miguel Harvey Treating Patryk Conant/Extender: Miguel Harvey in Treatment: 4 Vital Signs Time Taken: 14:18 Temperature (F): 98.4 Pulse (bpm): 88 Respiratory Rate (breaths/min): 18 Blood Pressure (mmHg): 138/74 Reference Range: 80 - 120 mg / dl Electronic Signature(s) Signed: 08/09/2020 10:34:15 AM By: Miguel Harvey Entered ByHansel Harvey on 08/08/2020 14:19:18

## 2020-08-15 ENCOUNTER — Encounter: Payer: Medicare Other | Admitting: Physician Assistant

## 2020-08-15 ENCOUNTER — Other Ambulatory Visit: Payer: Self-pay

## 2020-08-15 DIAGNOSIS — E11621 Type 2 diabetes mellitus with foot ulcer: Secondary | ICD-10-CM | POA: Diagnosis not present

## 2020-08-15 NOTE — Progress Notes (Addendum)
LEO, FRAY ([id number]) Visit Report for 08/15/2020 Chief Complaint Document Details Patient Name: Miguel Harvey, Miguel Harvey Date of Service: 08/15/2020 2:00 PM Medical Record Number: [id number] Patient Account Number: [id number] Date of Birth/Sex: 1945-06-22 (75 y.o. M) Treating RN: Rogers Blocker Primary Care Provider: [name] Other Clinician: Referring Provider: [name] Treating Provider/Extender: Rowan Blase in Treatment: 5 Information Obtained from: Patient Chief Complaint Left heel ulcer Electronic Signature(s) Signed: 08/15/2020 2:21:48 PM By: Lenda Kelp PA-C Entered By: Lenda Kelp on 08/15/2020 14:21:48 Miguel Harvey ([id number]) -------------------------------------------------------------------------------- HPI Details Patient Name: Miguel Harvey Date of Service: 08/15/2020 2:00 PM Medical Record Number: [id number] Patient Account Number: [id number] Date of Birth/Sex: 09/12/1945 (75 y.o. M) Treating RN: Rogers Blocker Primary Care Provider: [name] Other Clinician: Referring Provider: [name] Treating Provider/Extender: Rowan Blase in Treatment: 5 History of Present Illness HPI Description: 07/08/20 patient presents today for initial inspection here in our clinic concerning issues that he is been having with his left heel for about a month he tells me. To be honest the wound looks like it is probably been there longer than a month but I cannot be 100% sure of that. Fortunately there does not appear to be any signs of infection. We did perform arterial studies today and that was also somewhat low as far as the reading was concerned at 0.63. He does have a known history of peripheral vascular disease. He has diabetes mellitus type 2, hypertension, coronary artery disease, peripheral vascular disease, and is on long-term anticoagulant therapy. Specifically Plavix. Right now the wound does appear to be showing some signs of healing which is good news.  Nonetheless given his poor arterial flow I do want him to see [facility] for a formal arterial study with ABI and TBI to further evaluate what is going on here. 07/15/2020 upon evaluation today patient appears to be doing excellent in regard to his heel ulcer. He was seen by vascular. Unfortunately we do not have the report documented yet that I can review though the patient tells me that the provider said that he had good blood flow. with that being said right now he seems to be doing excellent and is making great progress. Overall I think the wound is headed in the right direction. 08/01/2020 evaluation today patient appears to be doing well with regard to his wound. Fortunately there is no signs that the moment that there is any evidence of infection which is good news. No fevers, chills, nausea, vomiting, or diarrhea. With that being said he has a lot of callus and he tells me that the dressing was only changed once since he was last here. Again I have no way to independently verify that that if that is true that does disappoint me. 08/08/2020 upon evaluation today patient appears to be doing excellent in regard to his wound on the heel. In fact I think that he is actually making great progress. The biggest issue I see here is simply that the patient probably needs to have some moisturizer in order to keep this under control. It looks like the area is extremely dry. With that being said I think that he may need to just do this on his own as apparently the facility is not getting much help with anything dressing change wise. 08/15/2020 on evaluation today patient appears to be doing well with regard to his heel region. He has been tolerating the dressing changes without complication. Fortunately there is no  signs of active infection. No fevers, chills, nausea, vomiting, or diarrhea. Electronic Signature(s) Signed: 08/15/2020 2:51:58 PM By: Lenda Kelp PA-C Entered By: Lenda Kelp on 08/15/2020 14:51:58 Miguel Harvey ([id number]) -------------------------------------------------------------------------------- Physical Exam Details Patient Name: Miguel Harvey Date of Service: 08/15/2020 2:00 PM Medical Record Number: [id number] Patient Account Number: [id number] Date of Birth/Sex: 09-28-1945 (75 y.o. M) Treating RN: Rogers Blocker Primary Care Provider: [name] Other Clinician: Referring Provider: [name] Treating Provider/Extender: Allen Derry Weeks in Treatment: 5 Constitutional Well-nourished and well-hydrated in no acute distress. Respiratory normal breathing without difficulty. Psychiatric this patient is able to make decisions and demonstrates good insight into disease process. Alert and Oriented x 3. pleasant and cooperative. Notes Upon inspection patient's wound bed showed signs again of complete epithelization has been using lotion over the area there is no evidence of infection and nothing open currently which is great news. In general I am very pleased with where he stands. Electronic Signature(s) Signed: 08/15/2020 2:52:12 PM By: Lenda Kelp PA-C Entered By: Lenda Kelp on 08/15/2020 14:52:12 Miguel Harvey ([id number]) -------------------------------------------------------------------------------- Physician Orders Details Patient Name: Miguel Harvey Date of Service: 08/15/2020 2:00 PM Medical Record Number: [id number] Patient Account Number: [id number] Date of Birth/Sex: [birth year] (75 y.o. M) Treating RN: Rogers Blocker Primary Care Provider: [name] Other Clinician: Referring Provider: [name] Treating Provider/Extender: Rowan Blase in Treatment: 5 Verbal / Phone Orders: No Diagnosis Coding ICD-10 Coding Code Description E11.621 Type 2 diabetes mellitus with foot ulcer L97.422 Non-pressure chronic ulcer of left heel and midfoot with fat layer exposed I10 Essential (primary) hypertension I25.10  Atherosclerotic heart disease of native coronary artery without angina pectoris I73.89 Other specified peripheral vascular diseases Z79.01 Long term (current) use of anticoagulants Discharge From [facility] o Discharge from Wound Care Center Treatment Complete Electronic Signature(s) Signed: 08/15/2020 5:03:18 PM By: Lenda Kelp PA-C Signed: 08/15/2020 5:24:26 PM By: Phillis Haggis, Dondra Prader RN Entered By: Phillis Haggis, Dondra Prader on 08/15/2020 14:53:40 Miguel Harvey ([id number]) -------------------------------------------------------------------------------- Problem List Details Patient Name: Miguel Harvey Date of Service: 08/15/2020 2:00 PM Medical Record Number: [id number] Patient Account Number: [id number] Date of Birth/Sex: 1945/11/10 (75 y.o. M) Treating RN: Rogers Blocker Primary Care Provider: [name] Other Clinician: Referring Provider: [name] Treating Provider/Extender: Allen Derry Weeks in Treatment: 5 Active Problems ICD-10 Encounter Code Description Active Date MDM Diagnosis E11.621 Type 2 diabetes mellitus with foot ulcer 07/08/2020 No Yes L97.422 Non-pressure chronic ulcer of left heel and midfoot with fat layer 07/08/2020 No Yes exposed I10 Essential (primary) hypertension 07/08/2020 No Yes I25.10 Atherosclerotic heart disease of native coronary artery without angina 07/08/2020 No Yes pectoris I73.89 Other specified peripheral vascular diseases 07/08/2020 No Yes Z79.01 Long term (current) use of anticoagulants 07/08/2020 No Yes Inactive Problems Resolved Problems Electronic Signature(s) Signed: 08/15/2020 2:21:42 PM By: Lenda Kelp PA-C Entered By: Lenda Kelp on 08/15/2020 14:21:42 Miguel Harvey ([id number]) -------------------------------------------------------------------------------- Progress Note Details Patient Name: Miguel Harvey Date of Service: 08/15/2020 2:00 PM Medical Record Number: [id number] Patient Account Number:  [id number] Date of Birth/Sex: 28-Jul-1945 (75 y.o. M) Treating RN: Rogers Blocker Primary Care Provider: [name] Other Clinician: Referring Provider: [name] Treating Provider/Extender: Rowan Blase in Treatment: 5 Subjective Chief Complaint Information obtained from Patient Left heel ulcer History of Present Illness (HPI) 07/08/20 patient presents today for initial inspection here in our clinic concerning issues that he is been having with his left heel for about a month  he tells me. To be honest the wound looks like it is probably been there longer than a month but I cannot be 100% sure of that. Fortunately there does not appear to be any signs of infection. We did perform arterial studies today and that was also somewhat low as far as the reading was concerned at 0.63. He does have a known history of peripheral vascular disease. He has diabetes mellitus type 2, hypertension, coronary artery disease, peripheral vascular disease, and is on long-term anticoagulant therapy. Specifically Plavix. Right now the wound does appear to be showing some signs of healing which is good news. Nonetheless given his poor arterial flow I do want him to see [facility] for a formal arterial study with ABI and TBI to further evaluate what is going on here. 07/15/2020 upon evaluation today patient appears to be doing excellent in regard to his heel ulcer. He was seen by vascular. Unfortunately we do not have the report documented yet that I can review though the patient tells me that the provider said that he had good blood flow. with that being said right now he seems to be doing excellent and is making great progress. Overall I think the wound is headed in the right direction. 08/01/2020 evaluation today patient appears to be doing well with regard to his wound. Fortunately there is no signs that the moment that there is any evidence of infection which is good news. No fevers, chills,  nausea, vomiting, or diarrhea. With that being said he has a lot of callus and he tells me that the dressing was only changed once since he was last here. Again I have no way to independently verify that that if that is true that does disappoint me. 08/08/2020 upon evaluation today patient appears to be doing excellent in regard to his wound on the heel. In fact I think that he is actually making great progress. The biggest issue I see here is simply that the patient probably needs to have some moisturizer in order to keep this under control. It looks like the area is extremely dry. With that being said I think that he may need to just do this on his own as apparently the facility is not getting much help with anything dressing change wise. 08/15/2020 on evaluation today patient appears to be doing well with regard to his heel region. He has been tolerating the dressing changes without complication. Fortunately there is no signs of active infection. No fevers, chills, nausea, vomiting, or diarrhea. Objective Constitutional Well-nourished and well-hydrated in no acute distress. Vitals Time Taken: 2:32 PM, Temperature: 99.2 F, Pulse: 98 bpm, Respiratory Rate: 18 breaths/min, Blood Pressure: 136/77 mmHg. Respiratory normal breathing without difficulty. Psychiatric this patient is able to make decisions and demonstrates good insight into disease process. Alert and Oriented x 3. pleasant and cooperative. General Notes: Upon inspection patient's wound bed showed signs again of complete epithelization has been using lotion over the area there is no evidence of infection and nothing open currently which is great news. In general I am very pleased with where he stands. Integumentary (Hair, Skin) Wound #1 status is Healed - Epithelialized. Original cause of wound was Trauma. The date acquired was: 06/07/2020. The wound has been in treatment 5 weeks. The wound is located on the Left,Midline Calcaneus. The  wound measures 0cm length x 0cm width x 0cm depth; 0cm^2 area and 0cm^3 volume. There is no tunneling or undermining noted. There is a none present  amount of drainage noted. The wound margin is distinct with the outline attached to the wound base. There is no granulation within the wound bed. There is no necrotic tissue within the wound bed. Miguel Harvey, Miguel Harvey ([id number]) Assessment Active Problems ICD-10 Type 2 diabetes mellitus with foot ulcer Non-pressure chronic ulcer of left heel and midfoot with fat layer exposed Essential (primary) hypertension Atherosclerotic heart disease of native coronary artery without angina pectoris Other specified peripheral vascular diseases Long term (current) use of anticoagulants Plan 1. Would recommend that we want to continue with wound care measures as before and patient is in agreement with that plan this includes the use of the moisturizing lotion which I think is still the best way to go. I do not see anything that is open and I think that is the way we should continue at this point. 2. I am also can recommend at this time that we have the patient continue to monitor for any signs of worsening from the standpoint of infection or opening if anything occurs he should let me know. We will see him back for follow-up visit as needed. Electronic Signature(s) Signed: 08/15/2020 2:52:44 PM By: Lenda Kelp PA-C Entered By: Lenda Kelp on 08/15/2020 14:52:44 Miguel Harvey ([id number]) -------------------------------------------------------------------------------- SuperBill Details Patient Name: Miguel Harvey Date of Service: 08/15/2020 Medical Record Number: [id number] Patient Account Number: [id number] Date of Birth/Sex: Jun 05, 1945 (75 y.o. M) Treating RN: Rogers Blocker Primary Care Provider: [name] Other Clinician: Referring Provider: [name] Treating Provider/Extender: Rowan Blase in Treatment: 5 Diagnosis Coding ICD-10  Codes Code Description [phone] Type 2 diabetes mellitus with foot ulcer L97.422 Non-pressure chronic ulcer of left heel and midfoot with fat layer exposed I10 Essential (primary) hypertension I25.10 Atherosclerotic heart disease of native coronary artery without angina pectoris I73.89 Other specified peripheral vascular diseases Z79.01 Long term (current) use of anticoagulants Facility Procedures CPT4 Code: [id number] Description: [phone] - WOUND CARE VISIT-LEV 2 EST PT Modifier: Quantity: 1 Physician Procedures CPT4 Code: [id number] Description: 99213 - WC PHYS LEVEL 3 - EST PT Modifier: Quantity: 1 CPT4 Code: Description: ICD-10 Diagnosis Description E11.621 Type 2 diabetes mellitus with foot ulcer L97.422 Non-pressure chronic ulcer of left heel and midfoot with fat layer exp I10 Essential (primary) hypertension I25.10 Atherosclerotic heart disease of native  coronary artery without angina Modifier: osed pectoris Quantity: Electronic Signature(s) Signed: 08/15/2020 5:03:18 PM By: Lenda Kelp PA-C Signed: 08/15/2020 5:24:26 PM By: Lajean Manes RN Previous Signature: 08/15/2020 2:52:56 PM Version By: Lenda Kelp PA-C Entered By: Phillis Haggis, Dondra Prader on 08/15/2020 14:54:09

## 2020-08-15 NOTE — Progress Notes (Addendum)
PEYTEN, WEARE ([id number]) Visit Report for 08/15/2020 Arrival Information Details Patient Name: Miguel Harvey, Miguel Harvey Date of Service: 08/15/2020 2:00 PM Medical Record Number: [id number] Patient Account Number: [id number] Date of Birth/Sex: 11-Mar-1946 (74 y.o. M) Treating RN: Yevonne Pax Primary Care Revis Whalin: [name] Other Clinician: Referring Starleen Trussell: [name] Treating Raheim Beutler/Extender: Rowan Blase in Treatment: 5 Visit Information History Since Last Visit All ordered tests and consults were completed: No Patient Arrived: Wheel Chair Added or deleted any medications: No Arrival Time: 14:28 Any new allergies or adverse reactions: No Accompanied By: self Had a fall or experienced change in No Transfer Assistance: None activities of daily living that may affect Patient Identification Verified: Yes risk of falls: Secondary Verification Process Completed: Yes Signs or symptoms of abuse/neglect since last visito No Patient Has Alerts: Yes Hospitalized since last visit: No Patient Alerts: Patient on Blood Thinner Implantable device outside of the clinic excluding No Type II Diabetic cellular tissue based products placed in the center 81MG  aspirin since last visit: Plavix Has Dressing in Place as Prescribed: Yes Pain Present Now: No Electronic Signature(s) Signed: 08/19/2020 2:18:56 PM By: 08/21/2020 RN Entered By: Yevonne Pax on 08/15/2020 14:32:04 08/17/2020 (Miguel Harvey) -------------------------------------------------------------------------------- Clinic Level of Care Assessment Details Patient Name: [id number] Date of Service: 08/15/2020 2:00 PM Medical Record Number: 08/17/2020 Patient Account Number: [id number] Date of Birth/Sex: Sep 21, 1945 (74 y.o. M) Treating RN: 12-28-1970 Primary Care Jeneva Schweizer: Rogers Blocker Other Clinician: Referring Audyn Dimercurio: [name] Treating Avram Danielson/Extender: [name] in Treatment: 5 Clinic Level of Care  Assessment Items TOOL 4 Quantity Score X - Use when only an EandM is performed on FOLLOW-UP visit 1 0 ASSESSMENTS - Nursing Assessment / Reassessment X - Reassessment of Co-morbidities (includes updates in patient status) 1 10 X- 1 5 Reassessment of Adherence to Treatment Plan ASSESSMENTS - Wound and Skin Assessment / Reassessment []  - Simple Wound Assessment / Reassessment - one wound 0 []  - 0 Complex Wound Assessment / Reassessment - multiple wounds []  - 0 Dermatologic / Skin Assessment (not related to wound area) ASSESSMENTS - Focused Assessment []  - Circumferential Edema Measurements - multi extremities 0 []  - 0 Nutritional Assessment / Counseling / Intervention []  - 0 Lower Extremity Assessment (monofilament, tuning fork, pulses) []  - 0 Peripheral Arterial Disease Assessment (using hand held doppler) ASSESSMENTS - Ostomy and/or Continence Assessment and Care []  - Incontinence Assessment and Management 0 []  - 0 Ostomy Care Assessment and Management (repouching, etc.) PROCESS - Coordination of Care X - Simple Patient / Family Education for ongoing care 1 15 []  - 0 Complex (extensive) Patient / Family Education for ongoing care []  - 0 Staff obtains Rowan Blase, Records, Test Results / Process Orders []  - 0 Staff telephones HHA, Nursing Homes / Clarify orders / etc []  - 0 Routine Transfer to another Facility (non-emergent condition) []  - 0 Routine Hospital Admission (non-emergent condition) []  - 0 New Admissions / / Ordering NPWT, Apligraf, etc. []  - 0 Emergency Hospital Admission (emergent condition) X- 1 10 Simple Discharge Coordination []  - 0 Complex (extensive) Discharge Coordination PROCESS - Special Needs []  - Pediatric / Minor Patient Management 0 []  - 0 Isolation Patient Management []  - 0 Hearing / Language / Visual special needs []  - 0 Assessment of Community assistance (transportation, D/C planning, etc.) []  - 0 Additional  assistance / Altered mentation []  - 0 Support Surface(s) Assessment (bed, cushion, seat, etc.) INTERVENTIONS - Wound Cleansing / Measurement IMRI, LOR ( ) []  -  0 Simple Wound Cleansing - one wound []  - 0 Complex Wound Cleansing - multiple wounds []  - 0 Wound Imaging (photographs - any number of wounds) []  - 0 Wound Tracing (instead of photographs) []  - 0 Simple Wound Measurement - one wound []  - 0 Complex Wound Measurement - multiple wounds INTERVENTIONS - Wound Dressings []  - Small Wound Dressing one or multiple wounds 0 []  - 0 Medium Wound Dressing one or multiple wounds []  - 0 Large Wound Dressing one or multiple wounds []  - 0 Application of Medications - topical []  - 0 Application of Medications - injection INTERVENTIONS - Miscellaneous []  - External ear exam 0 []  - 0 Specimen Collection (cultures, biopsies, blood, body fluids, etc.) []  - 0 Specimen(s) / Culture(s) sent or taken to Lab for analysis []  - 0 Patient Transfer (multiple staff / / Similar devices) []  - 0 Simple Staple / Suture removal (25 or less) []  - 0 Complex Staple / Suture removal (26 or more) []  - 0 Hypo / Hyperglycemic Management (close monitor of Blood Glucose) []  - 0 Ankle / Brachial Index (ABI) - do not check if billed separately X- 1 5 Vital Signs Has the patient been seen at the hospital within the last three years: Yes Total Score: 45 Level Of Care: New/Established - Level 2 Electronic Signature(s) Signed: 08/15/2020 5:24:26 PM By: , RN Entered By: , on 08/15/2020 14:53:55 ( ) -------------------------------------------------------------------------------- Encounter Discharge Information Details Patient Name: Date of Service: 08/15/2020 2:00 PM Medical Record Number: Patient Account Number: Date of Birth/Sex: July 25, 1945 (74 y.o. M) Treating RN: Primary Care Jakaya Jacobowitz: Other Clinician: Referring Jamielee Mchale: Treating Grae Cannata/Extender: in Treatment: 5 Encounter Discharge Information Items Discharge Condition: Stable Ambulatory Status: Wheelchair Discharge Destination: Skilled Nursing Facility Orders Sent: Yes Transportation: Private Auto Accompanied By: self Schedule Follow-up Appointment: No Clinical Summary of Care: Electronic Signature(s) Signed: 08/15/2020 5:24:26 PM By: Phillis Haggis, Dondra Prader RN Entered By: Phillis Haggis, Dondra Prader on 08/15/2020 14:54:47 Miguel Harvey ([id number]) -------------------------------------------------------------------------------- Lower Extremity Assessment Details Patient Name: Miguel Harvey Date of Service: 08/15/2020 2:00 PM Medical Record Number: [id number] Patient Account Number: [id number] Date of Birth/Sex: [birth year] (74 y.o. M) Treating RN: Rogers Blocker Primary Care [place]: [name] Other Clinician: Referring Raechell Singleton: [name] Treating Zigmond Trela/Extender: Rowan Blase Weeks in Treatment: 5 Electronic Signature(s) Signed: 08/19/2020 2:18:56 PM By: Phillis Haggis RN Entered By: Dondra Prader on 08/15/2020 14:36:30 Dondra Prader (08/17/2020) -------------------------------------------------------------------------------- Multi Wound Chart Details Patient Name: Miguel Harvey Date of Service: 08/15/2020 2:00 PM Medical Record Number: Miguel Harvey Patient Account Number: 08/17/2020 Date of Birth/Sex: 1945-09-13 (74 y.o. M) Treating RN: 03/30/1946 Primary Care Nanami Whitelaw: 12-28-1970 Other Clinician: Referring Joleigh Mineau: Yevonne Pax Treating Dierre Crevier/Extender: [name] in Treatment: 5 Vital Signs Height(in): Pulse(bpm): 98 Weight(lbs): Blood Pressure(mmHg): 136/77 Body Mass Index(BMI): Temperature(F): 99.2 Respiratory Rate(breaths/min): 18 Photos: [N/A:N/A] Wound Location: Left, Midline Calcaneus N/A N/A Wounding  Event: Trauma N/A N/A Primary Etiology: Diabetic Wound/Ulcer of the Lower N/A N/A Extremity Comorbid History: Cataracts, Chronic Obstructive N/A N/A Pulmonary Disease (COPD), Congestive Heart Failure, Coronary Artery Disease, Hypertension, Type II Diabetes Date Acquired: 06/07/2020 N/A N/A Weeks of Treatment: 5 N/A N/A Wound Status: Healed - Epithelialized N/A N/A Measurements L x W x D (cm) 0x0x0 N/A N/A Area (cm) : 0 N/A N/A Volume (cm) : 0 N/A N/A % Reduction in Area: 100.00% N/A N/A % Reduction  in Volume: 100.00% N/A N/A Classification: Grade 2 N/A N/A Exudate Amount: None Present N/A N/A Wound Margin: Distinct, outline attached N/A N/A Granulation Amount: None Present (0%) N/A N/A Necrotic Amount: None Present (0%) N/A N/A Exposed Structures: Fascia: No N/A N/A Fat Layer (Subcutaneous Tissue): No Tendon: No Muscle: No Joint: No Bone: No Epithelialization: Large (67-100%) N/A N/A Treatment Notes Electronic Signature(s) Signed: 08/15/2020 5:24:26 PM By: Phillis Haggis, Dondra Prader RN Entered By: Phillis Haggis, Dondra Prader on 08/15/2020 14:49:48 Miguel Harvey ([id number]) -------------------------------------------------------------------------------- Multi-Disciplinary Care Plan Details Patient Name: Miguel Harvey Date of Service: 08/15/2020 2:00 PM Medical Record Number: [id number] Patient Account Number: [id number] Date of Birth/Sex: 07/27/1945 (74 y.o. M) Treating RN: Rogers Blocker Primary Care Jahmier Willadsen: [name] Other Clinician: Referring Jozlyn Schatz: [name] Treating Inna Tisdell/Extender: Allen Derry Weeks in Treatment: 5 Active Inactive Electronic Signature(s) Signed: 08/15/2020 5:24:26 PM By: Phillis Haggis, Dondra Prader RN Entered By: Phillis Haggis, Dondra Prader on 08/15/2020 14:49:31 Miguel Harvey ([id number]) -------------------------------------------------------------------------------- Pain Assessment Details Patient Name: Miguel Harvey Date of Service: 08/15/2020  2:00 PM Medical Record Number: [id number] Patient Account Number: [id number] Date of Birth/Sex: 1945/06/13 (74 y.o. M) Treating RN: Yevonne Pax Primary Care Emmery Seiler: [name] Other Clinician: Referring Leiana Rund: [name] Treating Sephora Boyar/Extender: Rowan Blase in Treatment: 5 Active Problems Location of Pain Severity and Description of Pain Patient Has Paino No Site Locations Pain Management and Medication Current Pain Management: Electronic Signature(s) Signed: 08/19/2020 2:18:56 PM By: Yevonne Pax RN Entered By: Yevonne Pax on 08/15/2020 14:32:26 Miguel Harvey ([id number]) -------------------------------------------------------------------------------- Patient/Caregiver Education Details Patient Name: Miguel Harvey Date of Service: 08/15/2020 2:00 PM Medical Record Number: [id number] Patient Account Number: [id number] Date of Birth/Gender: 1945-08-16 (74 y.o. M) Treating RN: Rogers Blocker Primary Care Physician: [name] Other Clinician: Referring Physician: [name] Treating Physician/Extender: Rowan Blase in Treatment: 5 Education Assessment Education Provided To: Patient Education Topics Provided Notes discharge instructions Electronic Signature(s) Signed: 08/15/2020 5:24:26 PM By: Phillis Haggis, Dondra Prader RN Entered By: Phillis Haggis, Dondra Prader on 08/15/2020 14:54:23 Miguel Harvey ([id number]) -------------------------------------------------------------------------------- Wound Assessment Details Patient Name: Miguel Harvey Date of Service: 08/15/2020 2:00 PM Medical Record Number: [id number] Patient Account Number: [id number] Date of Birth/Sex: [birth year] (74 y.o. M) Treating RN: Yevonne Pax Primary Care Aine Strycharz: [name] Other Clinician: Referring Cowen Pesqueira: [name] Treating Earlie Schank/Extender: Rowan Blase in Treatment: 5 Wound Status Wound Number: 1 Primary Diabetic Wound/Ulcer of the Lower Extremity Etiology: Wound  Location: Left, Midline Calcaneus Wound Healed - Epithelialized Wounding Event: Trauma Status: Date Acquired: 06/07/2020 Comorbid Cataracts, Chronic Obstructive Pulmonary Disease (COPD), Weeks Of Treatment: 5 History: Congestive Heart Failure, Coronary Artery Disease, Clustered Wound: No Hypertension, Type II Diabetes Photos Wound Measurements Length: (cm) 0 Width: (cm) 0 Depth: (cm) 0 Area: (cm) 0 Volume: (cm) 0 % Reduction in Area: 100% % Reduction in Volume: 100% Epithelialization: Large (67-100%) Tunneling: No Undermining: No Wound Description Classification: Grade 2 Wound Margin: Distinct, outline attached Exudate Amount: None Present Foul Odor After Cleansing: No Slough/Fibrino No Wound Bed Granulation Amount: None Present (0%) Exposed Structure Necrotic Amount: None Present (0%) Fascia Exposed: No Fat Layer (Subcutaneous Tissue) Exposed: No Tendon Exposed: No Muscle Exposed: No Joint Exposed: No Bone Exposed: No Electronic Signature(s) Signed: 08/15/2020 5:24:26 PM By: Phillis Haggis, Dondra Prader RN Signed: 08/19/2020 2:18:56 PM By: Yevonne Pax RN Entered By: Phillis Haggis, Dondra Prader on 08/15/2020 14:49:13 Miguel Harvey ([id number]) -------------------------------------------------------------------------------- Vitals Details Patient Name: Miguel Harvey Date of Service: 08/15/2020 2:00 PM Medical Record Number: [id number] Patient  Account Number: [id number] Date of Birth/Sex: 1945/03/31 (75 y.o. M) Treating RN: Yevonne Pax Primary Care Khaylee Mcevoy: [name] Other Clinician: Referring Coline Calkin: [name] Treating Callum Wolf/Extender: Rowan Blase in Treatment: 5 Vital Signs Time Taken: 14:32 Temperature (F): 99.2 Pulse (bpm): 98 Respiratory Rate (breaths/min): 18 Blood Pressure (mmHg): 136/77 Reference Range: 80 - 120 mg / dl Electronic Signature(s) Signed: 08/19/2020 2:18:56 PM By: Yevonne Pax RN Entered By: Yevonne Pax on 08/15/2020 14:32:20

## 2020-11-17 ENCOUNTER — Other Ambulatory Visit: Payer: Self-pay

## 2020-11-17 ENCOUNTER — Emergency Department: Payer: Medicare Other

## 2020-11-17 ENCOUNTER — Observation Stay
Admission: EM | Admit: 2020-11-17 | Discharge: 2020-11-19 | Disposition: A | Discharge status: Home / Self Care | Payer: Medicare Other | Attending: Internal Medicine | Admitting: Internal Medicine

## 2020-11-17 ENCOUNTER — Observation Stay: Payer: Medicare Other

## 2020-11-17 DIAGNOSIS — E118 Type 2 diabetes mellitus with unspecified complications: Secondary | ICD-10-CM

## 2020-11-17 DIAGNOSIS — R6510 Systemic inflammatory response syndrome (SIRS) of non-infectious origin without acute organ dysfunction: Secondary | ICD-10-CM | POA: Diagnosis not present

## 2020-11-17 DIAGNOSIS — R531 Weakness: Secondary | ICD-10-CM | POA: Diagnosis present

## 2020-11-17 DIAGNOSIS — Z791 Long term (current) use of non-steroidal anti-inflammatories (NSAID): Secondary | ICD-10-CM | POA: Diagnosis not present

## 2020-11-17 DIAGNOSIS — A419 Sepsis, unspecified organism: Principal | ICD-10-CM | POA: Insufficient documentation

## 2020-11-17 DIAGNOSIS — I5022 Chronic systolic (congestive) heart failure: Secondary | ICD-10-CM | POA: Diagnosis present

## 2020-11-17 DIAGNOSIS — R2242 Localized swelling, mass and lump, left lower limb: Secondary | ICD-10-CM | POA: Diagnosis not present

## 2020-11-17 DIAGNOSIS — J449 Chronic obstructive pulmonary disease, unspecified: Secondary | ICD-10-CM | POA: Diagnosis not present

## 2020-11-17 DIAGNOSIS — Z7984 Long term (current) use of oral hypoglycemic drugs: Secondary | ICD-10-CM | POA: Diagnosis not present

## 2020-11-17 DIAGNOSIS — E1169 Type 2 diabetes mellitus with other specified complication: Secondary | ICD-10-CM | POA: Diagnosis present

## 2020-11-17 DIAGNOSIS — N182 Chronic kidney disease, stage 2 (mild): Secondary | ICD-10-CM | POA: Diagnosis not present

## 2020-11-17 DIAGNOSIS — Z7901 Long term (current) use of anticoagulants: Secondary | ICD-10-CM | POA: Insufficient documentation

## 2020-11-17 DIAGNOSIS — I5031 Acute diastolic (congestive) heart failure: Secondary | ICD-10-CM | POA: Diagnosis not present

## 2020-11-17 DIAGNOSIS — Z20822 Contact with and (suspected) exposure to covid-19: Secondary | ICD-10-CM | POA: Insufficient documentation

## 2020-11-17 DIAGNOSIS — I130 Hypertensive heart and chronic kidney disease with heart failure and stage 1 through stage 4 chronic kidney disease, or unspecified chronic kidney disease: Secondary | ICD-10-CM | POA: Insufficient documentation

## 2020-11-17 DIAGNOSIS — F17210 Nicotine dependence, cigarettes, uncomplicated: Secondary | ICD-10-CM | POA: Diagnosis not present

## 2020-11-17 DIAGNOSIS — E1122 Type 2 diabetes mellitus with diabetic chronic kidney disease: Secondary | ICD-10-CM | POA: Insufficient documentation

## 2020-11-17 DIAGNOSIS — N179 Acute kidney failure, unspecified: Secondary | ICD-10-CM | POA: Diagnosis not present

## 2020-11-17 DIAGNOSIS — E119 Type 2 diabetes mellitus without complications: Secondary | ICD-10-CM

## 2020-11-17 DIAGNOSIS — I152 Hypertension secondary to endocrine disorders: Secondary | ICD-10-CM | POA: Diagnosis present

## 2020-11-17 DIAGNOSIS — Z79899 Other long term (current) drug therapy: Secondary | ICD-10-CM | POA: Insufficient documentation

## 2020-11-17 DIAGNOSIS — I2510 Atherosclerotic heart disease of native coronary artery without angina pectoris: Secondary | ICD-10-CM | POA: Diagnosis not present

## 2020-11-17 DIAGNOSIS — M7989 Other specified soft tissue disorders: Secondary | ICD-10-CM

## 2020-11-17 DIAGNOSIS — E785 Hyperlipidemia, unspecified: Secondary | ICD-10-CM | POA: Diagnosis present

## 2020-11-17 DIAGNOSIS — I739 Peripheral vascular disease, unspecified: Secondary | ICD-10-CM | POA: Diagnosis present

## 2020-11-17 DIAGNOSIS — E1159 Type 2 diabetes mellitus with other circulatory complications: Secondary | ICD-10-CM | POA: Diagnosis present

## 2020-11-17 LAB — BASIC METABOLIC PANEL
Anion gap: 11 (ref 5–15)
BUN: 26 mg/dL — ABNORMAL HIGH (ref 8–23)
CO2: 24 mmol/L (ref 22–32)
Calcium: 8.6 mg/dL — ABNORMAL LOW (ref 8.9–10.3)
Chloride: 96 mmol/L — ABNORMAL LOW (ref 98–111)
Creatinine, Ser: 1.47 mg/dL — ABNORMAL HIGH (ref 0.61–1.24)
GFR, Estimated: 49 mL/min — ABNORMAL LOW (ref >60)
Glucose, Bld: 304 mg/dL — ABNORMAL HIGH (ref 70–99)
Potassium: 3.9 mmol/L (ref 3.5–5.1)
Sodium: 131 mmol/L — ABNORMAL LOW (ref 135–145)

## 2020-11-17 LAB — LACTIC ACID, PLASMA
Lactic Acid, Venous: 2.4 mmol/L (ref 0.5–1.9)
Lactic Acid, Venous: 4.3 mmol/L (ref 0.5–1.9)
Lactic Acid, Venous: 1.7 mmol/L (ref 0.5–1.9)

## 2020-11-17 LAB — RESP PANEL BY RT-PCR (FLU A&B, COVID) ARPGX2
Influenza A by PCR: NEGATIVE
Influenza B by PCR: NEGATIVE
SARS Coronavirus 2 by RT PCR: NEGATIVE

## 2020-11-17 LAB — CBC
HCT: 38.6 % — ABNORMAL LOW (ref 39.0–52.0)
Hemoglobin: 12.9 g/dL — ABNORMAL LOW (ref 13.0–17.0)
MCH: 29 pg (ref 26.0–34.0)
MCHC: 33.4 g/dL (ref 30.0–36.0)
MCV: 86.7 fL (ref 80.0–100.0)
Platelets: 224 10*3/uL (ref 150–400)
RBC: 4.45 MIL/uL (ref 4.22–5.81)
RDW: 14.1 % (ref 11.5–15.5)
WBC: 16.5 10*3/uL — ABNORMAL HIGH (ref 4.0–10.5)
nRBC: 0 % (ref 0.0–0.2)

## 2020-11-17 LAB — CBG MONITORING, ED: Glucose-Capillary: 208 mg/dL — ABNORMAL HIGH (ref 70–99)

## 2020-11-17 LAB — HEMOGLOBIN A1C
Hgb A1c MFr Bld: 9.1 % — ABNORMAL HIGH (ref 4.8–5.6)
Mean Plasma Glucose: 214.47 mg/dL

## 2020-11-17 MED ORDER — MAGNESIUM OXIDE -MG SUPPLEMENT 400 (240 MG) MG PO TABS
400.0000 mg | ORAL_TABLET | Freq: Two times a day (BID) | ORAL | Status: DC
  Administered 2020-11-18 – 2020-11-19 (×3): 400 mg via ORAL
  Filled 2020-11-17 (×3): qty 1

## 2020-11-17 MED ORDER — METRONIDAZOLE 500 MG/100ML IV SOLN
500.0000 mg | Freq: Once | INTRAVENOUS | Status: AC
  Administered 2020-11-17: 500 mg via INTRAVENOUS
  Filled 2020-11-17: qty 100

## 2020-11-17 MED ORDER — SODIUM CHLORIDE 0.9% FLUSH
3.0000 mL | Freq: Two times a day (BID) | INTRAVENOUS | Status: DC
  Administered 2020-11-17 – 2020-11-19 (×4): 3 mL via INTRAVENOUS

## 2020-11-17 MED ORDER — ACETAMINOPHEN 325 MG PO TABS
650.0000 mg | ORAL_TABLET | Freq: Four times a day (QID) | ORAL | Status: DC | PRN
  Administered 2020-11-19: 650 mg via ORAL
  Filled 2020-11-17: qty 2

## 2020-11-17 MED ORDER — ACETAMINOPHEN 650 MG RE SUPP: 650.0000 mg | Freq: Four times a day (QID) | RECTAL | Status: DC | PRN

## 2020-11-17 MED ORDER — ATORVASTATIN CALCIUM 20 MG PO TABS
20.0000 mg | ORAL_TABLET | Freq: Every evening | ORAL | Status: DC
  Administered 2020-11-18: 20 mg via ORAL
  Filled 2020-11-17: qty 1

## 2020-11-17 MED ORDER — CLOPIDOGREL BISULFATE 75 MG PO TABS
75.0000 mg | ORAL_TABLET | Freq: Every day | ORAL | Status: DC
  Administered 2020-11-18 – 2020-11-19 (×2): 75 mg via ORAL
  Filled 2020-11-17 (×2): qty 1

## 2020-11-17 MED ORDER — ENOXAPARIN SODIUM 40 MG/0.4ML IJ SOSY
40.0000 mg | PREFILLED_SYRINGE | INTRAMUSCULAR | Status: DC
  Administered 2020-11-17 – 2020-11-18 (×2): 40 mg via SUBCUTANEOUS
  Filled 2020-11-17 (×2): qty 0.4

## 2020-11-17 MED ORDER — VANCOMYCIN HCL 1750 MG/350ML IV SOLN
1750.0000 mg | Freq: Once | INTRAVENOUS | Status: AC
  Administered 2020-11-17: 1750 mg via INTRAVENOUS
  Filled 2020-11-17: qty 350

## 2020-11-17 MED ORDER — VANCOMYCIN HCL IN DEXTROSE 1-5 GM/200ML-% IV SOLN
1000.0000 mg | Freq: Once | INTRAVENOUS | Status: DC
  Filled 2020-11-17: qty 200

## 2020-11-17 MED ORDER — TAMSULOSIN HCL 0.4 MG PO CAPS
0.4000 mg | ORAL_CAPSULE | Freq: Every day | ORAL | Status: DC
  Administered 2020-11-18 – 2020-11-19 (×2): 0.4 mg via ORAL
  Filled 2020-11-17 (×3): qty 1

## 2020-11-17 MED ORDER — LACTATED RINGERS IV SOLN: INTRAVENOUS | Status: DC

## 2020-11-17 MED ORDER — INSULIN ASPART 100 UNIT/ML IJ SOLN
0.0000 [IU] | Freq: Three times a day (TID) | INTRAMUSCULAR | Status: DC
  Administered 2020-11-18 (×2): 3 [IU] via SUBCUTANEOUS
  Administered 2020-11-18 – 2020-11-19 (×2): 2 [IU] via SUBCUTANEOUS
  Administered 2020-11-19: 1 [IU] via SUBCUTANEOUS
  Filled 2020-11-17 (×5): qty 1

## 2020-11-17 MED ORDER — SODIUM CHLORIDE 0.9 % IV SOLN
2.0000 g | Freq: Once | INTRAVENOUS | Status: AC
  Administered 2020-11-17: 2 g via INTRAVENOUS
  Filled 2020-11-17: qty 2

## 2020-11-17 MED ORDER — CARVEDILOL 6.25 MG PO TABS
6.2500 mg | ORAL_TABLET | Freq: Two times a day (BID) | ORAL | Status: DC
  Administered 2020-11-18 – 2020-11-19 (×3): 6.25 mg via ORAL
  Filled 2020-11-17 (×3): qty 1

## 2020-11-17 MED ORDER — ONDANSETRON HCL 4 MG PO TABS: 4.0000 mg | ORAL_TABLET | Freq: Four times a day (QID) | ORAL | Status: DC | PRN

## 2020-11-17 MED ORDER — ONDANSETRON HCL 4 MG/2ML IJ SOLN
4.0000 mg | Freq: Four times a day (QID) | INTRAMUSCULAR | Status: DC | PRN
  Filled 2020-11-17: qty 2

## 2020-11-17 MED ORDER — SODIUM CHLORIDE 0.9 % IV SOLN: INTRAVENOUS | Status: DC

## 2020-11-17 MED ORDER — INSULIN ASPART 100 UNIT/ML IJ SOLN
0.0000 [IU] | Freq: Every day | INTRAMUSCULAR | Status: DC
  Administered 2020-11-17: 2 [IU] via SUBCUTANEOUS
  Filled 2020-11-17: qty 1

## 2020-11-17 MED ORDER — ASPIRIN EC 81 MG PO TBEC
81.0000 mg | DELAYED_RELEASE_TABLET | Freq: Every day | ORAL | Status: DC
  Administered 2020-11-18 – 2020-11-19 (×2): 81 mg via ORAL
  Filled 2020-11-17 (×3): qty 1

## 2020-11-17 MED ORDER — INSULIN GLARGINE-YFGN 100 UNIT/ML ~~LOC~~ SOLN
5.0000 [IU] | Freq: Every day | SUBCUTANEOUS | Status: DC
  Administered 2020-11-17 – 2020-11-18 (×2): 5 [IU] via SUBCUTANEOUS
  Filled 2020-11-17 (×3): qty 0.05

## 2020-11-17 MED ORDER — SENNOSIDES-DOCUSATE SODIUM 8.6-50 MG PO TABS: 1.0000 | ORAL_TABLET | Freq: Every evening | ORAL | Status: DC | PRN

## 2020-11-17 NOTE — Sepsis Progress Note (Signed)
Dr. Allena Katz made aware of Lactic Acid of 4.3

## 2020-11-17 NOTE — Progress Notes (Addendum)
CODE SEPSIS - PHARMACY COMMUNICATION  **Broad Spectrum Antibiotics should be administered within 1 hour of Sepsis diagnosis**  Time Code Sepsis Called/Page Received: 1801  Antibiotics Ordered: Cefepime, vancomycin, Flagyl  Time of 1st antibiotic administration: @ 1810    Gardner Candle, PharmD, BCPS Clinical Pharmacist 11/17/2020 6:08 PM

## 2020-11-17 NOTE — H&P (Addendum)
History and Physical    Miguel Harvey HRC:[id number] DOB: [birth year] DOA: 11/17/2020  PCP: Garwin Brothers, MD  Patient coming from: [facility]  I have personally briefly reviewed patient's old medical records in [place]  Chief Complaint: Weakness  HPI: Miguel Harvey is a 75 y.o. male with medical history significant for HFrEF (EF recovered to 50-55%), NICM, nonobstructive CAD, PAD s/p right BKA, IDT2DM, HTN, HLD, CKD stage II, NSVT/PVCs, and tobacco use who presented to the ED from his nursing facility for evaluation of weakness.  Patient currently resides at [facility].  He says he was changing his bed earlier today and appeared to be leaning towards his left side.  Staff at his facility was concerned that he may be having a stroke and called EMS and he was subsequently brought to the ED.  Patient himself has no complaints.  He denied any focal weakness, change in sensation, slurred speech.  He reports a chronic nonproductive cough, likely related to smoking which is unchanged.  He denies any subjective fevers, chills, diaphoresis, rhinorrhea, nausea, vomiting, headache, change in vision, chest pain, palpitations, dyspnea, abdominal pain, diarrhea, dysuria.  He has had swelling in his left leg not quite for some time.  ED Course:  Initial vitals showed BP 111/80, pulse 106, RR 20, temp 100.9 F, SPO2 99% on room air.  Labs show WBC 16.5, hemoglobin 12.9, platelets 224,000, sodium 131, potassium 3.9, bicarb 24, BUN 26, creatinine 1.47, EGFR 49, serum glucose 304, lactic acid 2.4.  SARS-CoV-2 PCR is negative.  Influenza is negative.  Blood culture was obtained and pending.  UA and urine culture ordered and pending collection.  2 view chest x-ray is negative for focal consolidation, edema, or effusion.  Patient was given IV vancomycin, cefepime, Flagyl and started on maintenance fluids with LR at 150 mLs/hour.  The hospitalist service was  consulted to admit for further evaluation and management.  Review of Systems: All systems reviewed and are negative except as documented in history of present illness above.   Past Medical History:  Diagnosis Date   Abnormal posture    CHF (congestive heart failure) (HCC)    Chronic kidney disease    CKD   stage 2   Complication of anesthesia    heart stopped x2 during toe amputation   COPD (chronic obstructive pulmonary disease) (HCC)    Coronary artery disease, non-occlusive    a. LHC 05/2011: 10-20% ostial LAD stenosis   Depression    Diabetes mellitus    HLD (hyperlipidemia)    Hypertension    Muscle weakness    Neuromuscular disorder (HCC)    neuropathy   NICM (nonischemic cardiomyopathy) ([place])    a. TTE 2/213: EF 50-55%, mild LVH, normal wall motion, Gr2DD, mildly dilated LA; b. TTE 2/13: EF 40-45%, mild concentric LVH, global HK, no evidence of LV thrombus; c. TTE 4/19: EF 40%, diffuse HK, Gr2DD, mild MR, mildly dilated LA, RV cavity size normal w/ normal RVSF, PASP 63; d. TTE 4/19: EF 25-30%, diffuse HK, mild LVH, mild MR, mild PASP; e. TTE 5/19: EF 20-25%, diffuse HK, moderate MR,   NSVT (nonsustained ventricular tachycardia) (HCC)    PAD (peripheral artery disease) (HCC)    a. s/p right BKA s/p  recent balloon angioplasty fo the left peroneal artery, left posterior tibial artery, tibioperoneal trunk, and stenting to the distal left SFA in 07/2017   PEA (Pulseless electrical activity) ([place])    heart stopped beating during  surgery for amputation of toes    Past Surgical History:  Procedure Laterality Date   AMPUTATION Right 07/02/2017   Procedure: RIGHT BELOW KNEE AMPUTATION;  Surgeon: Newt Minion, MD;  Location: [facility];  Service: Orthopedics;  Laterality: Right;   CARDIAC CATHETERIZATION     CATARACT EXTRACTION W/PHACO Left 11/08/2018   Procedure: CATARACT EXTRACTION PHACO AND INTRAOCULAR LENS PLACEMENT ([place])  LEFT VISION BLUE;  Surgeon: Marchia Meiers, MD;  Location: [facility]  ORS;  Service: Ophthalmology;  Laterality: Left;  [place] 03:38.9 CDE 42.37 Fluid pack lot # [id number]   cyst removal from  right leg about 6 years ago     [place] Left 08/10/2017   Procedure: IRRIGATION AND DEBRIDEMENT FOOT;  Surgeon: Samara Deist, DPM;  Location: [facility] ORS;  Service: Podiatry;  Laterality: Left;   LEFT HEART CATHETERIZATION WITH CORONARY ANGIOGRAM N/A 05/11/2011   Procedure: LEFT HEART CATHETERIZATION WITH CORONARY ANGIOGRAM;  Surgeon: Troy Sine, MD;  Location: [facility] CATH LAB;  Service: Cardiovascular;  Laterality: N/A;   LOWER EXTREMITY ANGIOGRAPHY Left 08/13/2017   Procedure: Lower Extremity Angiography and PICC line placement;  Surgeon: Algernon Huxley, MD;  Location: [facility] CV LAB;  Service: Cardiovascular;  Laterality: Left;   ruptured navel     repair of   TOE AMPUTATION     not sure of when this surgery occured, but his heart stopped beating x 2 during surgery    Social History:  reports that he has been smoking cigarettes. He quit smokeless tobacco use about 9 years ago. He reports that he does not currently use alcohol after a past usage of about 6.0 standard drinks per week. He reports that he does not use drugs.  Allergies  Allergen Reactions   Spironolactone     Hyperkalemia (currently takes spironolactone)    Family History  Problem Relation Age of Onset   CAD Other    Diabetes Other      Prior to Admission medications   Medication Sig Start Date End Date Taking? Authorizing Provider  aspirin EC 81 MG EC tablet Take 1 tablet (81 mg total) by mouth daily. 08/14/17   Fritzi Mandes, MD  atorvastatin (LIPITOR) 20 MG tablet Take 20 mg by mouth every evening.     [provider]  carvedilol (COREG) 6.25 MG tablet Take 6.25 mg by mouth 2 (two) times daily with a meal.    [provider]  ciclopirox (LOPROX) 0.77 % cream Apply 1 application topically daily. 06/06/19   [provider]  clopidogrel (PLAVIX) 75 MG  tablet Take 1 tablet (75 mg total) by mouth daily. 08/14/17   Fritzi Mandes, MD  Dulaglutide (TRULICITY) 1.5 ZR/0.0TM SOPN Inject 1.5 mg into the skin every Friday.    [provider]  furosemide (LASIX) 40 MG tablet Take 1 tablet (40 mg total) by mouth daily. 08/14/17   Fritzi Mandes, MD  insulin lispro (HUMALOG) 100 UNIT/ML KwikPen Inject 5 Units into the skin 3 (three) times daily. HOLD IF CBG < or = 150 04/05/19   [provider]  LANTUS SOLOSTAR 100 UNIT/ML Solostar Pen Inject 5 Units into the skin daily.  04/27/19   [provider]  linagliptin (TRADJENTA) 5 MG TABS tablet Take 5 mg by mouth daily.    [provider]  Magnesium 400 MG TABS Take 400 mg by mouth 2 (two) times daily.     [provider]  metFORMIN (GLUCOPHAGE) 500 MG tablet Take 500 mg by mouth  2 (two) times daily with a meal.     [provider]  Multiple Vitamin (MULTIVITAMIN WITH MINERALS) TABS tablet Take 1 tablet by mouth daily.    [provider]  Probiotic Product (ALIGN) 4 MG CAPS Take 4 mg by mouth daily.    [provider]  sertraline (ZOLOFT) 25 MG tablet Take 25 mg by mouth daily.  04/22/19   [provider]  sertraline (ZOLOFT) 50 MG tablet Take 50 mg by mouth daily.    [provider]  tamsulosin (FLOMAX) 0.4 MG CAPS capsule Take 1 capsule (0.4 mg total) by mouth daily. 07/12/17   Velvet Bathe, MD    Physical Exam: Vitals:   11/17/20 1645 11/17/20 1657 11/17/20 1734 11/17/20 1844  BP:  (!) 141/93 136/85 (!) 147/95  Pulse: 99 (!) 101 (!) 102 (!) 108  Resp: 16 16 16 16   Temp:      TempSrc:      SpO2: 99% 99% 98% 97%  Weight:      Height:       Constitutional: Resting in bed, NAD, calm, comfortable Eyes: PERRL, lids and conjunctivae normal ENMT: Mucous membranes are moist. Posterior pharynx clear of any exudate or lesions.poor dentition.  Neck: normal, supple, no masses. Respiratory: clear to auscultation bilaterally, no  wheezing, no crackles. Normal respiratory effort. No accessory muscle use.  Cardiovascular: Regular rate and rhythm, no murmurs / rubs / gallops.  +1 left lower extremity edema.  Abdomen: no tenderness, no masses palpated. No hepatosplenomegaly. Bowel sounds positive.  Musculoskeletal: S/p right BKA and left second-fifth toe amputation.  No clubbing / cyanosis. Good ROM, no contractures. Normal muscle tone.  Skin: Scabbed over lesion left heel.  No open wound, rash, cellulitis. No induration. Neurologic: CN 2-12 grossly intact. Sensation intact. Strength equal throughout. Psychiatric: Normal judgment and insight. Alert and oriented x 3. Normal mood.   Labs on Admission: I have personally reviewed following labs and imaging studies  CBC: Recent Labs  Lab 11/17/20 1107  WBC 16.5*  HGB 12.9*  HCT 38.6*  MCV 86.7  PLT 929   Basic Metabolic Panel: Recent Labs  Lab 11/17/20 1107  NA 131*  K 3.9  CL 96*  CO2 24  GLUCOSE 304*  BUN 26*  CREATININE 1.47*  CALCIUM 8.6*   GFR: Estimated Creatinine Clearance: 53.1 mL/min (A) (by C-G formula based on SCr of 1.47 mg/dL (H)). Liver Function Tests: No results for input(s): AST, ALT, ALKPHOS, BILITOT, PROT, ALBUMIN in the last 168 hours. No results for input(s): LIPASE, AMYLASE in the last 168 hours. No results for input(s): AMMONIA in the last 168 hours. Coagulation Profile: No results for input(s): INR, PROTIME in the last 168 hours. Cardiac Enzymes: No results for input(s): CKTOTAL, CKMB, CKMBINDEX, TROPONINI in the last 168 hours. BNP (last 3 results) No results for input(s): PROBNP in the last 8760 hours. HbA1C: No results for input(s): HGBA1C in the last 72 hours. CBG: No results for input(s): GLUCAP in the last 168 hours. Lipid Profile: No results for input(s): CHOL, HDL, LDLCALC, TRIG, CHOLHDL, LDLDIRECT in the last 72 hours. Thyroid Function Tests: No results for input(s): TSH, T4TOTAL, FREET4, T3FREE, THYROIDAB in the last  72 hours. Anemia Panel: No results for input(s): VITAMINB12, FOLATE, FERRITIN, TIBC, IRON, RETICCTPCT in the last 72 hours. Urine analysis:    Component Value Date/Time   COLORURINE AMBER (A) 08/11/2017 0729   APPEARANCEUR CLOUDY (A) 08/11/2017 0729   LABSPEC 1.025 08/11/2017 0729   PHURINE 5.0  08/11/2017 0729   GLUCOSEU NEGATIVE 08/11/2017 0729   HGBUR LARGE (A) 08/11/2017 0729   BILIRUBINUR NEGATIVE 08/11/2017 0729   KETONESUR NEGATIVE 08/11/2017 0729   PROTEINUR 30 (A) 08/11/2017 0729   UROBILINOGEN 0.2 05/07/2011 1813   NITRITE NEGATIVE 08/11/2017 0729   LEUKOCYTESUR SMALL (A) 08/11/2017 0729    Radiological Exams on Admission: DG Chest 2 View  Result Date: 11/17/2020 CLINICAL DATA:  Pt states that he has no complaints and that he is here because the nurses at [facility] said he was "leaning to the left"- pt denies weakness- no lean, facial droop, or other stroke symptoms notedHx CHF, CKD, COPD EXAM: CHEST - 2 VIEW COMPARISON:  08/13/2017 and older studies. FINDINGS: Cardiac silhouette is normal in size and configuration. Normal mediastinal and hilar contours. Clear lungs.  No pleural effusion or pneumothorax. Skeletal structures are intact. IMPRESSION: No active cardiopulmonary disease. Electronically Signed   By: Lajean Manes M.D.   On: 11/17/2020 16:41    EKG: Not performed.  Assessment/Plan Principal Problem:   SIRS (systemic inflammatory response syndrome) (HCC) Active Problems:   Type 2 diabetes mellitus (HCC)   Hyperlipidemia associated with type 2 diabetes mellitus (HCC)   PAD (peripheral artery disease) (HCC)   Hypertension associated with diabetes ([place])   Chronic systolic CHF (congestive heart failure) (HCC)   Miguel Harvey is a 75 y.o. male with medical history significant for HFrEF (EF recovered to 50-55%), NICM, nonobstructive CAD, PAD s/p right BKA, IDT2DM, HTN, HLD, CKD stage II, NSVT/PVCs, and tobacco use who is admitted with SIRS and  AKI.  SIRS: Patient presenting with fever, tachycardia, leukocytosis, elevated lactic acid without obvious infectious source or symptoms.  SARS-CoV-2 and influenza are negative.  CXR negative for evidence of pulmonary infiltrate.  Old left heel ulcer appears appropriately healed without other obvious skin infection.  Urinalysis is pending although he denies any dysuria.  He does have notable swelling to his left lower extremity.  He received broad-spectrum antibiotics with IV vancomycin, cefepime, and Flagyl while in the ED. -Follow-up blood culture and urinalysis -Hold further antibiotics for now -Obtain left lower extremity venous ultrasound to assess for DVT  AKI on CKD stage II: Renal function slightly worsened when compared to most recent labs from 1 year ago.  This could be acute kidney injury versus progression of CKD stage IIIa. -Hold Lasix, metformin for now -Give gentle IV fluid hydration and repeat labs in a.m.  Insulin-dependent type 2 diabetes with hyperglycemia: Continue insulin glargine 5 units nightly, add sensitive SSI.  Hold metformin, linagliptin, Trulicity.  HFrEF/NICM: Stable and asymptomatic.  Holding Lasix for now as above.  Not on ACE/ARB/spironolactone due to history of hyperkalemia.  Continue Coreg.  Monitor strict I/O's.  PAD s/p right BKA Nonobstructive CAD: Chronic and stable.  Denies any chest pain or claudication.  Continue aspirin, Plavix, Coreg, atorvastatin.  Hypertension: Continue Coreg.  Hyperlipidemia: Continue atorvastatin.  Tobacco use: Patient reports smoking 1 pack/day.  Smoking cessation advised.  He declines nicotine patch.  DVT prophylaxis: Lovenox Code Status: Full code, confirmed with patient Family Communication: Discussed with patient, he has discussed with family Disposition Plan: From [facility] and likely return to same facility on discharge Consults called: None Level of care: Med-Surg Admission status:  Status is:  Observation  The patient remains OBS appropriate and will d/c before 2 midnights.  Dispo: The patient is from: SNF              Anticipated d/c is to:  SNF              Patient currently is not medically stable to d/c.  Zada Finders MD [facility]  If 7PM-7AM, please contact night-coverage [email or web address]  11/17/2020, 7:10 PM

## 2020-11-17 NOTE — ED Notes (Addendum)
Pt has no complaints of pain, N/V/D, SHOB, CP, headache, dizziness.

## 2020-11-17 NOTE — ED Triage Notes (Signed)
First nurse note: Patient arrived by EMS from [facility]. EMS reports patient is leaning towards left side. EMS reports strong grips, blood sugar 333, A&O x4. Right below the knee amputee. EMS reports no facial droop. Staff was concerned patient might be having a stroke due to him spilling coffee and leaning towards the left side.

## 2020-11-17 NOTE — ED Triage Notes (Signed)
Pt states that he has no complaints and that he is here because the nurses at [facility] said he was "leaning to the left"- pt denies weakness- no lean, facial droop, or other stroke symptoms noted

## 2020-11-17 NOTE — Sepsis Progress Note (Signed)
Sepsis protocol called at 1800, elink will continue to monitor for sepsis protocol

## 2020-11-17 NOTE — ED Provider Notes (Signed)
[facility] Emergency Department Provider Note   ____________________________________________    I have reviewed the triage vital signs and the nursing notes.   HISTORY  Chief Complaint Weakness     HPI Miguel Harvey is a 75 y.o. male with history of CHF, CKD, COPD, CAD, diabetes who presents with generalized weakness.  Patient was initially sent to the emergency department because of concern for possible stroke because of generalized weakness.  He reports overall he feels well but does feel tired and fatigued.  No neurodeficits reported.  Does report a mild cough, no dysuria reported.  No abdominal pain.  No rashes.  Past Medical History:  Diagnosis Date   Abnormal posture    CHF (congestive heart failure) (HCC)    Chronic kidney disease    CKD   stage 2   Complication of anesthesia    heart stopped x2 during toe amputation   COPD (chronic obstructive pulmonary disease) (HCC)    Coronary artery disease, non-occlusive    a. LHC 05/2011: 10-20% ostial LAD stenosis   Depression    Diabetes mellitus    HLD (hyperlipidemia)    Hypertension    Muscle weakness    Neuromuscular disorder (HCC)    neuropathy   NICM (nonischemic cardiomyopathy) (HCC)    a. TTE 2/213: EF 50-55%, mild LVH, normal wall motion, Gr2DD, mildly dilated LA; b. TTE 2/13: EF 40-45%, mild concentric LVH, global HK, no evidence of LV thrombus; c. TTE 4/19: EF 40%, diffuse HK, Gr2DD, mild MR, mildly dilated LA, RV cavity size normal w/ normal RVSF, PASP 63; d. TTE 4/19: EF 25-30%, diffuse HK, mild LVH, mild MR, mild PASP; e. TTE 5/19: EF 20-25%, diffuse HK, moderate MR,   NSVT (nonsustained ventricular tachycardia) (HCC)    PAD (peripheral artery disease) (HCC)    a. s/p right BKA s/p  recent balloon angioplasty fo the left peroneal artery, left posterior tibial artery, tibioperoneal trunk, and stenting to the distal left SFA in 07/2017   PEA (Pulseless electrical activity) (HCC)     heart stopped beating during surgery for amputation of toes    Patient Active Problem List   Diagnosis Date Noted   SIRS (systemic inflammatory response syndrome) (HCC) 11/17/2020   PAD (peripheral artery disease) (HCC) 03/25/2018   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) 09/21/2017   Left foot infection 08/05/2017   Pressure injury of skin 06/29/2017   Sepsis (HCC) 06/28/2017   Diabetic foot infection (HCC) 06/28/2017   Elevated troponin 06/28/2017   Pulmonary nodule 07/01/2011   Acute diastolic heart failure, 2D EF 27-78% with grade 2 diastloic dysfunction 05/24/11   Family history of early CAD, brother died 26 CAD 05-24-2011   Dyslipidemia 05/24/2011   NICM, minor CAD at cath, EF 25% at cath, 55% by 2D May 24, 2011   Diabetes mellitus (HCC) 05/07/2011   HTN (hypertension), malignant 05/07/2011   TOBACCO DEPENDENCE 05/27/2006    Past Surgical History:  Procedure Laterality Date   AMPUTATION Right 07/02/2017   Procedure: RIGHT BELOW KNEE AMPUTATION;  Surgeon: Nadara Mustard, MD;  Location: [facility] OR;  Service: Orthopedics;  Laterality: Right;   CARDIAC CATHETERIZATION     CATARACT EXTRACTION W/PHACO Left 11/08/2018   Procedure: CATARACT EXTRACTION PHACO AND INTRAOCULAR LENS PLACEMENT (IOC)  LEFT VISION BLUE;  Surgeon: Elliot Cousin, MD;  Location: [facility] ORS;  Service: Ophthalmology;  Laterality: Left;  [place] 03:38.9 CDE 42.37 Fluid pack lot # [id number]   cyst removal from  right  leg about 6 years ago     IRRIGATION AND DEBRIDEMENT FOOT Left 08/10/2017   Procedure: IRRIGATION AND DEBRIDEMENT FOOT;  Surgeon: Gwyneth Revels, DPM;  Location: [facility] ORS;  Service: Podiatry;  Laterality: Left;   LEFT HEART CATHETERIZATION WITH CORONARY ANGIOGRAM N/A 05/11/2011   Procedure: LEFT HEART CATHETERIZATION WITH CORONARY ANGIOGRAM;  Surgeon: Lennette Bihari, MD;  Location: [facility] CATH LAB;  Service: Cardiovascular;  Laterality: N/A;   LOWER EXTREMITY ANGIOGRAPHY Left 08/13/2017    Procedure: Lower Extremity Angiography and PICC line placement;  Surgeon: Annice Needy, MD;  Location: [facility] INVASIVE CV LAB;  Service: Cardiovascular;  Laterality: Left;   ruptured navel     repair of   TOE AMPUTATION     not sure of when this surgery occured, but his heart stopped beating x 2 during surgery    Prior to Admission medications   Medication Sig Start Date End Date Taking? Authorizing Provider  aspirin EC 81 MG EC tablet Take 1 tablet (81 mg total) by mouth daily. 08/14/17   Enedina Finner, MD  atorvastatin (LIPITOR) 20 MG tablet Take 20 mg by mouth every evening.     [provider]  carvedilol (COREG) 6.25 MG tablet Take 6.25 mg by mouth 2 (two) times daily with a meal.    [provider]  ciclopirox (LOPROX) 0.77 % cream Apply 1 application topically daily. 06/06/19   [provider]  clopidogrel (PLAVIX) 75 MG tablet Take 1 tablet (75 mg total) by mouth daily. 08/14/17   Enedina Finner, MD  Dulaglutide (TRULICITY) 1.5 MG/0.5ML SOPN Inject 1.5 mg into the skin every Friday.    [provider]  furosemide (LASIX) 40 MG tablet Take 1 tablet (40 mg total) by mouth daily. 08/14/17   Enedina Finner, MD  insulin lispro (HUMALOG) 100 UNIT/ML KwikPen Inject 5 Units into the skin 3 (three) times daily. HOLD IF CBG < or = 150 04/05/19   [provider]  LANTUS SOLOSTAR 100 UNIT/ML Solostar Pen Inject 5 Units into the skin daily.  04/27/19   [provider]  linagliptin (TRADJENTA) 5 MG TABS tablet Take 5 mg by mouth daily.    [provider]  Magnesium 400 MG TABS Take 400 mg by mouth 2 (two) times daily.     [provider]  metFORMIN (GLUCOPHAGE) 500 MG tablet Take 500 mg by mouth 2 (two) times daily with a meal.     [provider]  Multiple Vitamin (MULTIVITAMIN WITH MINERALS) TABS tablet Take 1 tablet by mouth daily.    [provider]  Probiotic Product (ALIGN) 4 MG CAPS Take 4 mg by mouth daily.    [provider]  sertraline (ZOLOFT) 25 MG tablet Take 25 mg by mouth daily.  04/22/19   [provider]  sertraline (ZOLOFT) 50 MG tablet Take 50 mg by mouth daily.    [provider]  tamsulosin (FLOMAX) 0.4 MG CAPS capsule Take 1 capsule (0.4 mg total) by mouth daily. 07/12/17   Penny Pia, MD     Allergies Spironolactone  Family History  Problem Relation Age of Onset   CAD Other    Diabetes Other     Social History Social History   Tobacco Use   Smoking status: Every Day    Packs/day: 0.00    Years: 40.00    Pack years: 0.00    Types: Cigarettes   Smokeless tobacco: Former    Quit date: 05/07/2011   Tobacco comments:  3 cigs per day (previous hx of smoking 1 PPD)  Vaping Use   Vaping Use: Never used  Substance Use Topics   Alcohol use: Not Currently    Alcohol/week: 6.0 standard drinks    Types: 6 Cans of beer per week   Drug use: Never    Review of Systems  Constitutional: No fever/chills Eyes: No visual changes.  ENT: No sore throat. Cardiovascular: Denies chest pain. Respiratory: Denies shortness of breath. Gastrointestinal: No abdominal pain.  No nausea, no vomiting.   Genitourinary: Negative for dysuria. Musculoskeletal: Negative for back pain. Skin: Negative for rash. Neurological: As above   ____________________________________________   PHYSICAL EXAM:  VITAL SIGNS: ED Triage Vitals  Enc Vitals Group     BP 11/17/20 1056 111/80     Pulse Rate 11/17/20 1056 (!) 106     Resp 11/17/20 1056 20     Temp 11/17/20 1056 (!) 100.9 F (38.3 C)     Temp Source 11/17/20 1056 Oral     SpO2 11/17/20 1056 99 %     Weight 11/17/20 1056 99.8 kg (220 lb)     Height 11/17/20 1056 1.829 m (6')     Head Circumference --      Peak Flow --      Pain Score 11/17/20 1100 0     Pain Loc --      Pain Edu? --      Excl. in GC? --     Constitutional: Alert and oriented.  Eyes: Conjunctivae are normal.  Head: Atraumatic. Nose: No  congestion/rhinnorhea. Mouth/Throat: Mucous membranes are moist.    Cardiovascular: Normal rate, regular rhythm. Grossly normal heart sounds.  Good peripheral circulation. Respiratory: Normal respiratory effort.  No retractions. Lungs CTAB. Gastrointestinal: Soft and nontender. No distention.  No CVA tenderness.  Musculoskeletal: Right BKA, unremarkable exam Neurologic:  Normal speech and language. No gross focal neurologic deficits are appreciated.  Skin:  Skin is warm, dry and intact. No rash noted. Psychiatric: Mood and affect are normal. Speech and behavior are normal.  ____________________________________________   LABS (all labs ordered are listed, but only abnormal results are displayed)  Labs Reviewed  BASIC METABOLIC PANEL - Abnormal; Notable for the following components:      Result Value   Sodium 131 (*)    Chloride 96 (*)    Glucose, Bld 304 (*)    BUN 26 (*)    Creatinine, Ser 1.47 (*)    Calcium 8.6 (*)    GFR, Estimated 49 (*)    All other components within normal limits  CBC - Abnormal; Notable for the following components:   WBC 16.5 (*)    Hemoglobin 12.9 (*)    HCT 38.6 (*)    All other components within normal limits  LACTIC ACID, PLASMA - Abnormal; Notable for the following components:   Lactic Acid, Venous 2.4 (*)    All other components within normal limits  RESP PANEL BY RT-PCR (FLU A&B, COVID) ARPGX2  CULTURE, BLOOD (SINGLE)  URINE CULTURE  URINALYSIS, COMPLETE (UACMP) WITH MICROSCOPIC  LACTIC ACID, PLASMA   ____________________________________________  EKG  his ECG.  ____________________________________________  RADIOLOGY  Chest x-ray viewed by me, no acute abnormality ____________________________________________   PROCEDURES  Procedure(s) performed: No  Procedures   Critical Care performed: yes  CRITICAL CARE Performed by: Jene Every   Total critical care time: 30 minutes  Critical care time was exclusive of  separately billable procedures and treating other patients.  Critical care was  necessary to treat or prevent imminent or life-threatening deterioration.  Critical care was time spent personally by me on the following activities: development of treatment plan with patient and/or surrogate as well as nursing, discussions with consultants, evaluation of patient's response to treatment, examination of patient, obtaining history from patient or surrogate, ordering and performing treatments and interventions, ordering and review of laboratory studies, ordering and review of radiographic studies, pulse oximetry and re-evaluation of patient's condition.  ____________________________________________   INITIAL IMPRESSION / ASSESSMENT AND PLAN / ED COURSE  Pertinent labs & imaging results that were available during my care of the patient were reviewed by me and considered in my medical decision making (see chart for details).   Patient presents with generalized weakness as detailed above, neuro exam is normal, not consistent with CVA.  However patient found to have elevated temperature and mild tachycardia.  Will obtain labs, chest x-ray, urine  Lab work notable for elevated white blood cell count, pending urinalysis, chest x-ray unremarkable  Notified of elevated lactic acid still no clear source, nurses attempted to get urine ----------------------------------------- 6:27 PM on 11/17/2020 -----------------------------------------  Unable to obtain urinalysis, will cover with broad-spectrum antibiotics for presumed sepsis of unknown origin.  Will discuss with the hospitalist    ____________________________________________   FINAL CLINICAL IMPRESSION(S) / ED DIAGNOSES  Final diagnoses:  Sepsis without acute organ dysfunction, due to unspecified organism [facility])        Note:  This document was prepared using Dragon voice recognition software and may include unintentional dictation errors.     Jene Every, MD 11/17/20 2027

## 2020-11-17 NOTE — ED Notes (Signed)
Pt assisted to toilet. Pt asked to provide UA, pt instead had large BM and forgot to get UA. Pt provided water.

## 2020-11-17 NOTE — Progress Notes (Signed)
PHARMACY -  BRIEF ANTIBIOTIC NOTE   Pharmacy has received consult(s) for Vancomycin and Cefepime from an ED provider.  The patient's profile has been reviewed for ht/wt/allergies/indication/available labs.    One time order(s) placed for Vancomycin 1750mg  and Cefepime 2g  Further antibiotics/pharmacy consults should be ordered by admitting physician if indicated.                       Thank you, , PharmD, BCPS Clinical Pharmacist 11/17/2020 6:09 PM

## 2020-11-18 ENCOUNTER — Encounter: Payer: Self-pay | Admitting: Internal Medicine

## 2020-11-18 DIAGNOSIS — A419 Sepsis, unspecified organism: Secondary | ICD-10-CM | POA: Diagnosis not present

## 2020-11-18 DIAGNOSIS — R6510 Systemic inflammatory response syndrome (SIRS) of non-infectious origin without acute organ dysfunction: Secondary | ICD-10-CM | POA: Diagnosis not present

## 2020-11-18 LAB — BASIC METABOLIC PANEL WITH GFR
Anion gap: 7 (ref 5–15)
BUN: 27 mg/dL — ABNORMAL HIGH (ref 8–23)
CO2: 22 mmol/L (ref 22–32)
Calcium: 8.1 mg/dL — ABNORMAL LOW (ref 8.9–10.3)
Chloride: 104 mmol/L (ref 98–111)
Creatinine, Ser: 1.05 mg/dL (ref 0.61–1.24)
GFR, Estimated: >60 mL/min
Glucose, Bld: 209 mg/dL — ABNORMAL HIGH (ref 70–99)
Potassium: 3.6 mmol/L (ref 3.5–5.1)
Sodium: 133 mmol/L — ABNORMAL LOW (ref 135–145)

## 2020-11-18 LAB — URINALYSIS, COMPLETE (UACMP) WITH MICROSCOPIC
Bilirubin Urine: NEGATIVE
Glucose, UA: NEGATIVE mg/dL
Ketones, ur: NEGATIVE mg/dL
Leukocytes,Ua: NEGATIVE
Nitrite: NEGATIVE
Protein, ur: 30 mg/dL — AB
Specific Gravity, Urine: 1.02 (ref 1.005–1.030)
pH: 5 (ref 5.0–8.0)

## 2020-11-18 LAB — GLUCOSE, CAPILLARY
Glucose-Capillary: 211 mg/dL — ABNORMAL HIGH (ref 70–99)
Glucose-Capillary: 214 mg/dL — ABNORMAL HIGH (ref 70–99)
Glucose-Capillary: 167 mg/dL — ABNORMAL HIGH (ref 70–99)
Glucose-Capillary: 181 mg/dL — ABNORMAL HIGH (ref 70–99)

## 2020-11-18 LAB — CBC
HCT: 33.2 % — ABNORMAL LOW (ref 39.0–52.0)
Hemoglobin: 11.3 g/dL — ABNORMAL LOW (ref 13.0–17.0)
MCH: 29.2 pg (ref 26.0–34.0)
MCHC: 34 g/dL (ref 30.0–36.0)
MCV: 85.8 fL (ref 80.0–100.0)
Platelets: 192 10*3/uL (ref 150–400)
RBC: 3.87 MIL/uL — ABNORMAL LOW (ref 4.22–5.81)
RDW: 14 % (ref 11.5–15.5)
WBC: 14.3 10*3/uL — ABNORMAL HIGH (ref 4.0–10.5)
nRBC: 0 % (ref 0.0–0.2)

## 2020-11-18 LAB — MRSA NEXT GEN BY PCR, NASAL: MRSA by PCR Next Gen: NOT DETECTED

## 2020-11-18 NOTE — Care Management Obs Status (Signed)
MEDICARE OBSERVATION STATUS NOTIFICATION   Patient Details  Name: Miguel Harvey MRN: [id number] Date of Birth: Jan 21, 1946   Medicare Observation Status Notification Given:  Yes    Margarito Liner, LCSW 11/18/2020, 2:38 PM

## 2020-11-18 NOTE — NC FL2 (Signed)
New Site MEDICAID FL2 LEVEL OF CARE SCREENING TOOL     IDENTIFICATION  Patient Name: Miguel Harvey Birthdate: 06-17-1945 Sex: male Admission Date (Current Location): 11/17/2020  [facility] and [place] Number:  Chiropodist and Address:  [facility], [address] [place], [place] [address]      Provider Number: [id number]  Attending Physician Name and Address:  Lynn Ito, MD  Relative Name and Phone Number:       Current Level of Care: Hospital Recommended Level of Care: Skilled Nursing Facility Prior Approval Number:    Date Approved/Denied:   PASRR Number: [id number]  Discharge Plan: SNF    Current Diagnoses: Patient Active Problem List   Diagnosis Date Noted   SIRS (systemic inflammatory response syndrome) (HCC) 11/17/2020   Hypertension associated with diabetes (HCC) 11/17/2020   Chronic systolic CHF (congestive heart failure) (HCC) 11/17/2020   PAD (peripheral artery disease) (HCC) 03/25/2018   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) 09/21/2017   Left foot infection 08/05/2017   Pressure injury of skin 06/29/2017   Sepsis (HCC) 06/28/2017   Diabetic foot infection (HCC) 06/28/2017   Elevated troponin 06/28/2017   Pulmonary nodule 07/01/2011   Acute diastolic heart failure, 2D EF 47-65% with grade 2 diastloic dysfunction 2011/05/23   Family history of early CAD, brother died 48 CAD 05-23-2011   Hyperlipidemia associated with type 2 diabetes mellitus (HCC) 2011/05/23   NICM, minor CAD at cath, EF 25% at cath, 55% by 2D May 23, 2011   Type 2 diabetes mellitus (HCC) 05/07/2011   HTN (hypertension), malignant 05/07/2011   TOBACCO DEPENDENCE 05/27/2006    Orientation RESPIRATION BLADDER Height & Weight     Self, Time, Situation, Place  Normal Continent Weight: 220 lb (99.8 kg) Height:  6' (182.9 cm)  BEHAVIORAL SYMPTOMS/MOOD NEUROLOGICAL BOWEL NUTRITION STATUS   (None)  (None) Continent Diet  (Renal/carb modified. Fluid restriction 1200 mL.)  AMBULATORY STATUS COMMUNICATION OF NEEDS Skin     Verbally Normal                       Personal Care Assistance Level of Assistance              Functional Limitations Info  Sight, Hearing, Speech Sight Info: Adequate Hearing Info: Adequate Speech Info: Adequate    SPECIAL CARE FACTORS FREQUENCY                       Contractures Contractures Info: Not present    Additional Factors Info  Code Status, Allergies Code Status Info: Full code Allergies Info: Spironolactone           Current Medications (11/18/2020):  This is the current hospital active medication list Current Facility-Administered Medications  Medication Dose Route Frequency Provider Last Rate Last Admin   acetaminophen (TYLENOL) tablet 650 mg  650 mg Oral Q6H PRN Charlsie Quest, MD       Or   acetaminophen (TYLENOL) suppository 650 mg  650 mg Rectal Q6H PRN Charlsie Quest, MD       aspirin EC tablet 81 mg  81 mg Oral Daily Darreld Mclean R, MD   81 mg at 11/18/20 1038   atorvastatin (LIPITOR) tablet 20 mg  20 mg Oral QPM Darreld Mclean R, MD       carvedilol (COREG) tablet 6.25 mg  6.25 mg Oral BID WC Darreld Mclean R, MD   6.25 mg at 11/18/20 240 [id number]  clopidogrel (PLAVIX) tablet 75 mg  75 mg Oral Daily Darreld Mclean R, MD   75 mg at 11/18/20 1038   enoxaparin (LOVENOX) injection 40 mg  40 mg Subcutaneous Q24H Darreld Mclean R, MD   40 mg at 11/17/20 2245   insulin aspart (novoLOG) injection 0-5 Units  0-5 Units Subcutaneous QHS Charlsie Quest, MD   2 Units at 11/17/20 2246   insulin aspart (novoLOG) injection 0-9 Units  0-9 Units Subcutaneous TID WC Charlsie Quest, MD   3 Units at 11/18/20 1150   insulin glargine-yfgn (SEMGLEE) injection 5 Units  5 Units Subcutaneous QHS Charlsie Quest, MD   5 Units at 11/17/20 2249   magnesium oxide (MAG-OX) tablet 400 mg  400 mg Oral BID Darreld Mclean R, MD   400 mg at 11/18/20 1038   ondansetron (ZOFRAN)  tablet 4 mg  4 mg Oral Q6H PRN Charlsie Quest, MD       Or   ondansetron (ZOFRAN) injection 4 mg  4 mg Intravenous Q6H PRN Charlsie Quest, MD       senna-docusate (Senokot-S) tablet 1 tablet  1 tablet Oral QHS PRN Darreld Mclean R, MD       sodium chloride flush (NS) 0.9 % injection 3 mL  3 mL Intravenous Q12H Darreld Mclean R, MD   3 mL at 11/18/20 1038   tamsulosin (FLOMAX) capsule 0.4 mg  0.4 mg Oral Daily Darreld Mclean R, MD   0.4 mg at 11/18/20 1038     Discharge Medications: Please see discharge summary for a list of discharge medications.  Relevant Imaging Results:  Relevant Lab Results:   Additional Information SS#: [id number]  Margarito Liner, LCSW

## 2020-11-18 NOTE — ED Notes (Signed)
Patient transported to room 213 via stretcher on monitor by RN, stable at transfer

## 2020-11-18 NOTE — Progress Notes (Signed)
PROGRESS NOTE    Miguel Harvey  SPQ:[id number] DOB: 15-Feb-1946 DOA: 11/17/2020 PCP: [name], MD    Brief Narrative:  Miguel Harvey is a 75 y.o. male with medical history significant for HFrEF (EF recovered to 50-55%), NICM, nonobstructive CAD, PAD s/p right BKA, IDT2DM, HTN, HLD, CKD stage II, NSVT/PVCs, and tobacco use who presented to the ED from his nursing facility for evaluation of weakness.  Patient currently resides at [facility] healthcare nursing facility.  He says he was changing his bed earlier today and appeared to be leaning towards his left side.  Staff at his facility was concerned that he may be having a stroke and called EMS and he was subsequently brought to the ED.  Patient himself has no complaints.  He denied any focal weakness, change in sensation, slurred speech.  He reports a chronic nonproductive cough, likely related to smoking which is unchanged.  He denies any subjective fevers, chills, diaphoresis, rhinorrhea, nausea, vomiting, headache, change in vision, chest pain, palpitations, dyspnea, abdominal pain, diarrhea, dysuria.  He has had swelling in his left leg not quite for some time.   ED Course:  Initial vitals showed BP 111/80, pulse 106, RR 20, temp 100.9 F, SPO2 99% on room air.Miguel Harvey is a 75 y.o. male with medical history significant for HFrEF (EF recovered to 50-55%), NICM, nonobstructive CAD, PAD s/p right BKA, IDT2DM, HTN, HLD, CKD stage II, NSVT/PVCs, and tobacco use who presented to the ED from his nursing facility for evaluation of weakness.  Patient currently resides at [facility] healthcare nursing facility.  He says he was changing his bed earlier today and appeared to be leaning towards his left side.  Staff at his facility was concerned that he may be having a stroke and called EMS and he was subsequently brought to the ED.  Patient himself has no complaints.  He denied any focal weakness, change in sensation, slurred speech.  He reports a  chronic nonproductive cough, likely related to smoking which is unchanged.  He denies any subjective fevers, chills, diaphoresis, rhinorrhea, nausea, vomiting, headache, change in vision, chest pain, palpitations, dyspnea, abdominal pain, diarrhea, dysuria.  He has had swelling in his left leg not quite for some time.   ED Course:  Initial vitals showed BP 111/80, pulse 106, RR 20, temp 100.9 F, SPO2 99% on room air.    Consultants:    Procedures:  Antimicrobials:      Subjective: Feels better. Daughter reports ms at baseline .  Objective: Vitals:   11/18/20 0357 11/18/20 0500 11/18/20 0540 11/18/20 0805  BP: 140/73 129/74 (!) 160/92 138/71  Pulse: (!) 102 94 100 92  Resp: (!) 21 19 16    Temp: 99 F (37.2 C)  98.5 F (36.9 C) 98.7 F (37.1 C)  TempSrc: Oral  Oral Oral  SpO2: 95% 95% 99% 98%  Weight:      Height:        Intake/Output Summary (Last 24 hours) at 11/18/2020 0858 Last data filed at 11/18/2020 0644 Gross per 24 hour  Intake 1359.74 ml  Output --  Net 1359.74 ml   Filed Weights   11/17/20 1056  Weight: 99.8 kg    Examination:  General exam: Appears calm and comfortable  Respiratory system: Clear to auscultation. Respiratory effort normal. Cardiovascular system: S1 & S2 heard, RRR. No gallop Gastrointestinal system: Abdomen is nondistended, soft and nontender. No organomegaly or masses felt. Normal bowel sounds heard. Central nervous system: Alert and awake, grossly  intact Extremities: no edema Skin: warm, dry Psychiatry:  Mood & affect appropriate.     Data Reviewed: I have personally reviewed following labs and imaging studies  CBC: Recent Labs  Lab 11/17/20 1107 11/18/20 0633  WBC 16.5* 14.3*  HGB 12.9* 11.3*  HCT 38.6* 33.2*  MCV 86.7 85.8  PLT 224 192   Basic Metabolic Panel: Recent Labs  Lab 11/17/20 1107 11/18/20 0633  NA 131* 133*  K 3.9 3.6  CL 96* 104  CO2 24 22  GLUCOSE 304* 209*  BUN 26* 27*  CREATININE 1.47* 1.05   CALCIUM 8.6* 8.1*   GFR: Estimated Creatinine Clearance: 74.4 mL/min (by C-G formula based on SCr of 1.05 mg/dL). Liver Function Tests: No results for input(s): AST, ALT, ALKPHOS, BILITOT, PROT, ALBUMIN in the last 168 hours. No results for input(s): LIPASE, AMYLASE in the last 168 hours. No results for input(s): AMMONIA in the last 168 hours. Coagulation Profile: No results for input(s): INR, PROTIME in the last 168 hours. Cardiac Enzymes: No results for input(s): CKTOTAL, CKMB, CKMBINDEX, TROPONINI in the last 168 hours. BNP (last 3 results) No results for input(s): PROBNP in the last 8760 hours. HbA1C: Recent Labs    11/17/20 1942  HGBA1C 9.1*   CBG: Recent Labs  Lab 11/17/20 2151 11/18/20 0842  GLUCAP 208* 211*   Lipid Profile: No results for input(s): CHOL, HDL, LDLCALC, TRIG, CHOLHDL, LDLDIRECT in the last 72 hours. Thyroid Function Tests: No results for input(s): TSH, T4TOTAL, FREET4, T3FREE, THYROIDAB in the last 72 hours. Anemia Panel: No results for input(s): VITAMINB12, FOLATE, FERRITIN, TIBC, IRON, RETICCTPCT in the last 72 hours. Sepsis Labs: Recent Labs  Lab 11/17/20 1638 11/17/20 1901 11/17/20 2230  LATICACIDVEN 2.4* 4.3* 1.7    Recent Results (from the past 240 hour(s))  Blood culture (single)     Status: None (Preliminary result)   Collection Time: 11/17/20  4:38 PM   Specimen: BLOOD  Result Value Ref Range Status   Specimen Description BLOOD BLOOD RIGHT HAND  Final   Special Requests   Final    BOTTLES DRAWN AEROBIC AND ANAEROBIC Blood Culture results may not be optimal due to an inadequate volume of blood received in culture bottles   Culture   Final    NO GROWTH < 12 HOURS Performed at [facility], [address], [place], [place] [address]    Report Status PENDING  Incomplete  Resp Panel by RT-PCR (Flu A&B, Covid) Nasopharyngeal Swab     Status: None   Collection Time: 11/17/20  4:39 PM   Specimen: Nasopharyngeal Swab;  Nasopharyngeal(NP) swabs in vial transport medium  Result Value Ref Range Status   SARS Coronavirus 2 by RT PCR NEGATIVE NEGATIVE Final    Comment: (NOTE) SARS-CoV-2 target nucleic acids are NOT DETECTED.  The SARS-CoV-2 RNA is generally detectable in upper respiratory specimens during the acute phase of infection. The lowest concentration of SARS-CoV-2 viral copies this assay can detect is 138 copies/mL. A negative result does not preclude SARS-Cov-2 infection and should not be used as the sole basis for treatment or other patient management decisions. A negative result may occur with  improper specimen collection/handling, submission of specimen other than nasopharyngeal swab, presence of viral mutation(s) within the areas targeted by this assay, and inadequate number of viral copies(<138 copies/mL). A negative result must be combined with clinical observations, patient history, and epidemiological information. The expected result is Negative.  Fact Sheet for Patients:  BloggerCourse.com  Fact Sheet for  Healthcare Providers:  SeriousBroker.it  This test is no t yet approved or cleared by the [place] and  has been authorized for detection and/or diagnosis of SARS-CoV-2 by FDA under an Emergency Use Authorization (EUA). This EUA will remain  in effect (meaning this test can be used) for the duration of the COVID-19 declaration under Section 564(b)(1) of the Act, 21 U.S.C.section 360bbb-3(b)(1), unless the authorization is terminated  or revoked sooner.       Influenza A by PCR NEGATIVE NEGATIVE Final   Influenza B by PCR NEGATIVE NEGATIVE Final    Comment: (NOTE) The Xpert Xpress SARS-CoV-2/FLU/RSV plus assay is intended as an aid in the diagnosis of influenza from Nasopharyngeal swab specimens and should not be used as a sole basis for treatment. Nasal washings and aspirates are unacceptable for Xpert Xpress  SARS-CoV-2/FLU/RSV testing.  Fact Sheet for Patients: BloggerCourse.com  Fact Sheet for Healthcare Providers: SeriousBroker.it  This test is not yet approved or cleared by the [place] FDA and has been authorized for detection and/or diagnosis of SARS-CoV-2 by FDA under an Emergency Use Authorization (EUA). This EUA will remain in effect (meaning this test can be used) for the duration of the COVID-19 declaration under Section 564(b)(1) of the Act, 21 U.S.C. section 360bbb-3(b)(1), unless the authorization is terminated or revoked.  Performed at [facility], [address], [place], [place] [address]          Radiology Studies: DG Chest 2 View  Result Date: 11/17/2020 CLINICAL DATA:  Pt states that he has no complaints and that he is here because the nurses at [facility] said he was "leaning to the left"- pt denies weakness- no lean, facial droop, or other stroke symptoms notedHx CHF, CKD, COPD EXAM: CHEST - 2 VIEW COMPARISON:  08/13/2017 and older studies. FINDINGS: Cardiac silhouette is normal in size and configuration. Normal mediastinal and hilar contours. Clear lungs.  No pleural effusion or pneumothorax. Skeletal structures are intact. IMPRESSION: No active cardiopulmonary disease. Electronically Signed   By: Amie Portland M.D.   On: 11/17/2020 16:41   US Venous Img Lower Unilateral Left (DVT)  Result Date: 11/17/2020 CLINICAL DATA:  Swelling left lower extremity EXAM: LEFT LOWER EXTREMITY VENOUS DOPPLER ULTRASOUND TECHNIQUE: Gray-scale sonography with compression, as well as color and duplex ultrasound, were performed to evaluate the deep venous system(s) from the level of the common femoral vein through the popliteal and proximal calf veins. COMPARISON:  None. FINDINGS: VENOUS Normal compressibility of the common femoral, superficial femoral, and popliteal veins, as well as the visualized calf veins.  Visualized portions of profunda femoral vein and great saphenous vein unremarkable. No filling defects to suggest DVT on grayscale or color Doppler imaging. Doppler waveforms show normal direction of venous flow, normal respiratory plasticity and response to augmentation. Limited views of the contralateral common femoral vein are unremarkable. OTHER None. Limitations: Visualization of the calf veins due to edema IMPRESSION: No evidence of left lower extremity DVT. Electronically Signed   By: Charlett Nose M.D.   On: 11/17/2020 22:47        Scheduled Meds:  aspirin EC  81 mg Oral Daily   atorvastatin  20 mg Oral QPM   carvedilol  6.25 mg Oral BID WC   clopidogrel  75 mg Oral Daily   enoxaparin (LOVENOX) injection  40 mg Subcutaneous Q24H   insulin aspart  0-5 Units Subcutaneous QHS   insulin aspart  0-9 Units Subcutaneous TID WC   insulin glargine-yfgn  5 Units Subcutaneous QHS   magnesium oxide  400 mg Oral BID   sodium chloride flush  3 mL Intravenous Q12H   tamsulosin  0.4 mg Oral Daily   Continuous Infusions:  Assessment & Plan:   Principal Problem:   SIRS (systemic inflammatory response syndrome) (HCC) Active Problems:   Type 2 diabetes mellitus (HCC)   Hyperlipidemia associated with type 2 diabetes mellitus (HCC)   PAD (peripheral artery disease) (HCC)   Hypertension associated with diabetes (HCC)   Chronic systolic CHF (congestive heart failure) (HCC)   Miguel Harvey is a 75 y.o. male with medical history significant for HFrEF (EF recovered to 50-55%), NICM, nonobstructive CAD, PAD s/p right BKA, IDT2DM, HTN, HLD, CKD stage II, NSVT/PVCs, and tobacco use who is admitted with SIRS and AKI.   SIRS: Patient presenting with fever, tachycardia, leukocytosis, elevated lactic acid without obvious infectious source or symptoms.  SARS-CoV-2 and influenza are negative.  CXR negative for evidence of pulmonary infiltrate.  Old left heel ulcer appears appropriately healed without other  obvious skin infection.  Urinalysis is pending although he denies any dysuria.  He does have notable swelling to his left lower extremity.  He received broad-spectrum antibiotics with IV vancomycin, cefepime, and Flagyl while in the ED. 8/22- lactic acid improved with hydration venous US neg. For LLE dvt F/u bcx and ucx Follow-up blood culture and urinalysis  AKI on CKD stage II: 8/22- improved with ivf. Likely prerenal/dehydration Continue to hold lasix and metformin for now Monitor Will dc ivf to avoid volume overload. Encouraged po intake    Insulin-dependent type 2 diabetes with hyperglycemia: BG elevated on admission, improving DM is uncontrolled with A1c 9.1 Continue insulin, RISS Hold metformin, linaglipitin, and trulicity  Lactic acidosis-  Due to sirs/dehydration/aki Improved with hydration 4.3>>>1.7     HFrEF/NICM: Stable and asymptomatic.  Holding Lasix for now as above.  Not on ACE/ARB/spironolactone due to history of hyperkalemia.  Continue Coreg.   Monitor strict I/O's. 8/22 daily wt Dc ivf   PAD s/p right BKA Nonobstructive CAD: Chronic and stable.  Denies any chest pain or claudication.  Continue aspirin, Plavix, Coreg, atorvastatin.   Hypertension: Continue Coreg.   Hyperlipidemia: Continue atorvastatin.   Tobacco use: Patient reports smoking 1 pack/day.  Smoking cessation advised.  He declines nicotine patch.   DVT prophylaxis: lovenox Code Status:full Family Communication: daughter  Disposition Plan:  Status is: Observation  The patient remains OBS appropriate and will d/c before 2 midnights.  Dispo: The patient is from: SNF              Anticipated d/c is to: SNF              Patient currently is not medically stable to d/c.   Difficult to place patient No    Encouraged po intake bcx pending. Possible dc in am        LOS: 0 days   Time spent: 35 min with >50% on coc    Lynn Ito, MD [facility] Pager [phone]  [phone]  If 7PM-7AM, please contact night-coverage 11/18/2020, 8:58 AM

## 2020-11-18 NOTE — TOC Initial Note (Signed)
Transition of Care [facility]) - Initial/Assessment Note    Patient Details  Name: Miguel Harvey MRN: [id number] Date of Birth: [birth year]  Transition of Care [facility]) CM/SW Contact:    Candie Chroman, LCSW Phone Number: 11/18/2020, 2:55 PM  Clinical Narrative:   CSW met with patient. No supports at bedside. CSW introduced role and explained that discharge planning would be discussed. Patient confirmed he is a long-term resident at [facility]. He has been living there 3-4 years. No further concerns. CSW encouraged patient to contact CSW as needed. CSW will continue to follow patient for support and facilitate return to SNF once medically stable.               Expected Discharge Plan: Skilled Nursing Facility Barriers to Discharge: Continued Medical Work up   Patient Goals and CMS Choice     Choice offered to / list presented to : NA  Expected Discharge Plan and Services Expected Discharge Plan: [place] Acute Care Choice: Resumption of Svcs/PTA Provider Living arrangements for the past 2 months: [place]                                      Prior Living Arrangements/Services Living arrangements for the past 2 months: [place] Lives with:: Facility Resident Patient language and need for interpreter reviewed:: Yes Do you feel safe going back to the place where you live?: Yes      Need for Family Participation in Patient Care: Yes (Comment) Care giver support system in place?: Yes (comment)   Criminal Activity/Legal Involvement Pertinent to Current Situation/Hospitalization: No - Comment as needed  Activities of Daily Living Home Assistive Devices/Equipment: Eyeglasses, Wheelchair, Shower chair with back ADL Screening (condition at time of admission) Patient's cognitive ability adequate to safely complete daily activities?: Yes Is the patient deaf or have difficulty hearing?: No Does the patient have  difficulty seeing, even when wearing glasses/contacts?: No Does the patient have difficulty concentrating, remembering, or making decisions?: No Patient able to express need for assistance with ADLs?: Yes Does the patient have difficulty dressing or bathing?: No Independently performs ADLs?: Yes (appropriate for developmental age) Does the patient have difficulty walking or climbing stairs?: Yes Weakness of Legs: None Weakness of Arms/Hands: None  Permission Sought/Granted Permission sought to share information with : Facility Art therapist granted to share information with : Yes, Verbal Permission Granted     Permission granted to share info w AGENCY: [place]        Emotional Assessment Appearance:: Appears stated age Attitude/Demeanor/Rapport: Engaged, Gracious Affect (typically observed): Accepting, Appropriate, Calm, Pleasant Orientation: : Oriented to Self, Oriented to Place, Oriented to  Time, Oriented to Situation Alcohol / Substance Use: Not Applicable Psych Involvement: No (comment)  Admission diagnosis:  SIRS (systemic inflammatory response syndrome) (HCC) [R65.10] Swelling of left lower extremity [M79.89] Sepsis without acute organ dysfunction, due to unspecified organism [facility]) [A41.9] Patient Active Problem List   Diagnosis Date Noted   SIRS (systemic inflammatory response syndrome) ([place]) 11/17/2020   Hypertension associated with diabetes ([place]) 02/13/3566   Chronic systolic CHF (congestive heart failure) ([place]) 11/17/2020   PAD (peripheral artery disease) ([place]) 03/25/2018   Atherosclerotic peripheral vascular disease with gangrene ([place]) 09/21/2017   Left foot infection 08/05/2017   Pressure injury of skin 06/29/2017   Sepsis ([place]) 06/28/2017  Diabetic foot infection ([place]) 06/28/2017   Elevated troponin 06/28/2017   Pulmonary nodule 60/47/9987   Acute diastolic heart failure, 2D EF 50-55% with grade 2 diastloic dysfunction  2011-06-06   Family history of early CAD, brother died 63 CAD 06/06/2011   Hyperlipidemia associated with type 2 diabetes mellitus ([place]) 06-06-2011   NICM, minor CAD at cath, EF 25% at cath, 55% by 2D Jun 06, 2011   Type 2 diabetes mellitus ([place]) 05/07/2011   HTN (hypertension), malignant 05/07/2011   TOBACCO DEPENDENCE 05/27/2006   PCP:  Garwin Brothers, MD Pharmacy:  No Pharmacies Listed    Social Determinants of Health (SDOH) Interventions    Readmission Risk Interventions No flowsheet data found.

## 2020-11-19 DIAGNOSIS — R6510 Systemic inflammatory response syndrome (SIRS) of non-infectious origin without acute organ dysfunction: Secondary | ICD-10-CM | POA: Diagnosis not present

## 2020-11-19 DIAGNOSIS — A419 Sepsis, unspecified organism: Secondary | ICD-10-CM | POA: Diagnosis not present

## 2020-11-19 LAB — CBC
HCT: 31.4 % — ABNORMAL LOW (ref 39.0–52.0)
Hemoglobin: 10.6 g/dL — ABNORMAL LOW (ref 13.0–17.0)
MCH: 29 pg (ref 26.0–34.0)
MCHC: 33.8 g/dL (ref 30.0–36.0)
MCV: 86 fL (ref 80.0–100.0)
Platelets: 185 10*3/uL (ref 150–400)
RBC: 3.65 MIL/uL — ABNORMAL LOW (ref 4.22–5.81)
RDW: 13.9 % (ref 11.5–15.5)
WBC: 11.4 10*3/uL — ABNORMAL HIGH (ref 4.0–10.5)
nRBC: 0 % (ref 0.0–0.2)

## 2020-11-19 LAB — URINE CULTURE: Culture: NO GROWTH

## 2020-11-19 LAB — GLUCOSE, CAPILLARY
Glucose-Capillary: 162 mg/dL — ABNORMAL HIGH (ref 70–99)
Glucose-Capillary: 207 mg/dL — ABNORMAL HIGH (ref 70–99)

## 2020-11-19 NOTE — TOC Transition Note (Signed)
Transition of Care [facility]) - CM/SW Discharge Note   Patient Details  Name: Miguel Harvey MRN: [id number] Date of Birth: [birth year]  Transition of Care [facility]) CM/SW Contact:  Candie Chroman, LCSW Phone Number: 11/19/2020, 11:58 AM   Clinical Narrative: Patient has orders to discharge to [facility]. Patient is full code here. He had a DNR and MOST form in his chart from 2019. CSW met with patient who confirmed he wants to be a full code. RN is aware. RN will call report to [phone] (Room 3A). EMS transport arranged for 1:00. No further concerns. CSW signing off.    Final next level of care: Skilled Nursing Facility Barriers to Discharge: Barriers Resolved   Patient Goals and CMS Choice     Choice offered to / list presented to : NA  Discharge Placement   Existing PASRR number confirmed : 11/18/20          Patient [facility] bed at: [facility] Patient to be transferred to facility by: EMS Name of family member notified: Mickie Bail Patient and family notified of of transfer: 11/19/20  Discharge Plan and Services     Post Acute Care Choice: Resumption of Svcs/PTA Provider                               Social Determinants of Health (SDOH) Interventions     Readmission Risk Interventions No flowsheet data found.

## 2020-11-19 NOTE — Plan of Care (Signed)

## 2020-11-19 NOTE — Discharge Summary (Signed)
Miguel Harvey TDH:[id number] DOB: [birth year] DOA: 11/17/2020  PCP: Garwin Brothers, MD  Admit date: 11/17/2020 Discharge date: 11/19/2020  Admitted From: SNF Disposition: SNF  Recommendations for Outpatient Follow-up:  Follow up with PCP in 1 week Please obtain BMP/CBC in one week      Discharge Condition:Stable CODE STATUS: Full Diet recommendation: Heart Healthy / Carb Modified  Brief/Interim Summary: Per MIW:OEHO H Miguel Harvey is a 75 y.o. male with medical history significant for HFrEF (EF recovered to 50-55%), NICM, nonobstructive CAD, PAD s/p right BKA, IDT2DM, HTN, HLD, CKD stage II, NSVT/PVCs, and tobacco use who presented to the ED from his nursing facility for evaluation of weakness.  Patient currently resides at [facility] facility.  He says he was changing his bed earlier today and appeared to be leaning towards his left side.  Staff at his facility was concerned that he may be having a stroke and called EMS and he was subsequently brought to the ED.  Patient himself has no complaints.  He denied any focal weakness, change in sensation, slurred speech.  He reports a chronic nonproductive cough, likely related to smoking which is unchanged.  He denies any subjective fevers, chills, diaphoresis, rhinorrhea, nausea, vomiting, headache, change in vision, chest pain, palpitations, dyspnea, abdominal pain, diarrhea, dysuria.  He has had swelling in his left leg not quite for some time.   ED Course:  Initial vitals showed BP 111/80, pulse 106, RR 20, temp 100.9 F, SPO2 99% on room air.   Labs show WBC 16.5, hemoglobin 12.9, platelets 224,000, sodium 131, potassium 3.9, bicarb 24, BUN 26, creatinine 1.47, EGFR 49, serum glucose 304, lactic acid 2.4.   SARS-CoV-2 PCR is negative.  Influenza is negative.   2 view chest x-ray is negative for focal consolidation, edema, or effusion. Patient was admitted to the hospital.  Antibiotics were held.  Blood cultures and urine culture  to date negative.  He was found with AKI and dehydration.  He was started on IV fluids.  He is stable for discharge today.   SIRS: Sepsis ruled out  SARS-CoV-2 and influenza are negative.  CXR negative for evidence of pulmonary infiltrate.   I did negative venous ultrasound of his left lower extremity for DVT since he had some left lower extremity edema on presentation Lactic acid improved with hydration Blood and urine culture to date negative   AKI on CKD stage II: improved with ivf. Likely prerenal/dehydration Hold Lasix on discharge.  Needs to follow-up with primary care for reevaluation of Lasix. May need to use Lasix as as needed if weight up by 3 pounds.    Insulin-dependent type 2 diabetes with hyperglycemia: DM is uncontrolled with A1c 9.1 Resume home meds. Follow-up with PCP for further management    Lactic acidosis-  Due to sirs/dehydration/aki Improved with hydration        HFrEF/NICM: Stable and asymptomatic.  Without acute exacerbation  Not on ACE/ARB/spironolactone due to history of hyperkalemia.   Continue Coreg.   Felt Lasix due to AKI.  Needs to maybe take this as as needed but will defer to PCP or cardiologist for further assessment    PAD s/p right BKA Nonobstructive CAD: Chronic and stable.  Denies any chest pain or claudication.  Continue aspirin, Plavix, Coreg, atorvastatin.   Hypertension: Continue Coreg.   Hyperlipidemia: Continue atorvastatin.   Tobacco use: Patient reports smoking 1 pack/day.  Smoking cessation advised.  He declines nicotine patch.   Discharge Diagnoses:  Principal Problem:  SIRS (systemic inflammatory response syndrome) (HCC) Active Problems:   Type 2 diabetes mellitus (HCC)   Hyperlipidemia associated with type 2 diabetes mellitus (HCC)   PAD (peripheral artery disease) (HCC)   Hypertension associated with diabetes ([place])   Chronic systolic CHF (congestive heart failure) [facility])    Discharge  Instructions  Discharge Instructions     Call MD for:  temperature >100.4   Complete by: As directed    Diet - low sodium heart healthy   Complete by: As directed    Increase activity slowly   Complete by: As directed       Allergies as of 11/19/2020       Reactions   Spironolactone    Hyperkalemia (currently takes spironolactone)        Medication List     STOP taking these medications    furosemide 40 MG tablet Commonly known as: LASIX       TAKE these medications    Align 4 MG Caps Take 4 mg by mouth daily.   aspirin 81 MG EC tablet Take 1 tablet (81 mg total) by mouth daily.   atorvastatin 20 MG tablet Commonly known as: LIPITOR Take 20 mg by mouth every evening.   carvedilol 6.25 MG tablet Commonly known as: COREG Take 6.25 mg by mouth 2 (two) times daily with a meal.   ciclopirox 0.77 % cream Commonly known as: LOPROX Apply 1 application topically daily.   clopidogrel 75 MG tablet Commonly known as: PLAVIX Take 1 tablet (75 mg total) by mouth daily.   insulin lispro 100 UNIT/ML KwikPen Commonly known as: HUMALOG Inject 5 Units into the skin 3 (three) times daily. HOLD IF CBG < or = 150   Lantus SoloStar 100 UNIT/ML Solostar Pen Generic drug: insulin glargine Inject 5 Units into the skin daily.   linagliptin 5 MG Tabs tablet Commonly known as: TRADJENTA Take 5 mg by mouth daily.   Magnesium 400 MG Tabs Take 400 mg by mouth 2 (two) times daily.   metFORMIN 500 MG tablet Commonly known as: GLUCOPHAGE Take 500 mg by mouth 2 (two) times daily with a meal.   multivitamin with minerals Tabs tablet Take 1 tablet by mouth daily.   sertraline 25 MG tablet Commonly known as: ZOLOFT Take 25 mg by mouth daily.   tamsulosin 0.4 MG Caps capsule Commonly known as: FLOMAX Take 1 capsule (0.4 mg total) by mouth daily.   Trulicity 1.5 NK/5.3ZJ Sopn Generic drug: Dulaglutide Inject 1.5 mg into the skin every Friday.        Contact  information for follow-up providers     Garwin Brothers, MD Follow up in 1 week(s).   Specialty: Internal Medicine Contact information: [place] [address] [phone]              Contact information for after-discharge care     [facility] .   Service: Skilled Nursing Contact information: [place] [phone]                    Allergies  Allergen Reactions   Spironolactone     Hyperkalemia (currently takes spironolactone)    Consultations:    Procedures/Studies: DG Chest 2 View  Result Date: 11/17/2020 CLINICAL DATA:  Pt states that he has no complaints and that he is here because the nurses at [facility] said he was "leaning to the  left"- pt denies weakness- no lean, facial droop, or other stroke symptoms notedHx CHF, CKD, COPD EXAM: CHEST - 2 VIEW COMPARISON:  08/13/2017 and older studies. FINDINGS: Cardiac silhouette is normal in size and configuration. Normal mediastinal and hilar contours. Clear lungs.  No pleural effusion or pneumothorax. Skeletal structures are intact. IMPRESSION: No active cardiopulmonary disease. Electronically Signed   By: Lajean Manes M.D.   On: 11/17/2020 16:41   US Venous Img Lower Unilateral Left (DVT)  Result Date: 11/17/2020 CLINICAL DATA:  Swelling left lower extremity EXAM: LEFT LOWER EXTREMITY VENOUS DOPPLER ULTRASOUND TECHNIQUE: Gray-scale sonography with compression, as well as color and duplex ultrasound, were performed to evaluate the deep venous system(s) from the level of the common femoral vein through the popliteal and proximal calf veins. COMPARISON:  None. FINDINGS: VENOUS Normal compressibility of the common femoral, superficial femoral, and popliteal veins, as well as the visualized calf veins. Visualized portions of profunda femoral vein and great saphenous vein unremarkable. No filling defects to suggest  DVT on grayscale or color Doppler imaging. Doppler waveforms show normal direction of venous flow, normal respiratory plasticity and response to augmentation. Limited views of the contralateral common femoral vein are unremarkable. OTHER None. Limitations: Visualization of the calf veins due to edema IMPRESSION: No evidence of left lower extremity DVT. Electronically Signed   By: Rolm Baptise M.D.   On: 11/17/2020 22:47      Subjective: Feels better.  No chest pain, shortness of breath, dizziness or any other symptoms.  Discharge Exam: Vitals:   11/19/20 0510 11/19/20 0811  BP: 140/72 128/77  Pulse: 85 78  Resp: 18 18  Temp: 98.1 F (36.7 C) 97.7 F (36.5 C)  SpO2: 100% 100%   Vitals:   11/18/20 1950 11/19/20 0500 11/19/20 0510 11/19/20 0811  BP: 129/79  140/72 128/77  Pulse: 92  85 78  Resp: 16  18 18   Temp: 99.1 F (37.3 C)  98.1 F (36.7 C) 97.7 F (36.5 C)  TempSrc: Oral  Oral Oral  SpO2: 99%  100% 100%  Weight:  101.2 kg    Height:        General: Pt is alert, awake, not in acute distress Cardiovascular: RRR, S1/S2 +, no rubs, no gallops Respiratory: CTA bilaterally, no wheezing, no rhonchi Abdominal: Soft, NT, ND, bowel sounds + Extremities:trace edema LLE >R    The results of significant diagnostics from this hospitalization (including imaging, microbiology, ancillary and laboratory) are listed below for reference.     Microbiology: Recent Results (from the past 240 hour(s))  Blood culture (single)     Status: None (Preliminary result)   Collection Time: 11/17/20  4:38 PM   Specimen: BLOOD  Result Value Ref Range Status   Specimen Description BLOOD BLOOD RIGHT HAND  Final   Special Requests   Final    BOTTLES DRAWN AEROBIC AND ANAEROBIC Blood Culture results may not be optimal due to an inadequate volume of blood received in culture bottles   Culture   Final    NO GROWTH 2 DAYS Performed at [facility], [address] [place], [place]  [address]    Report Status PENDING  Incomplete  Resp Panel by RT-PCR (Flu A&B, Covid) Nasopharyngeal Swab     Status: None   Collection Time: 11/17/20  4:39 PM   Specimen: Nasopharyngeal Swab; Nasopharyngeal(NP) swabs in vial transport medium  Result Value Ref Range Status   SARS Coronavirus 2 by RT PCR NEGATIVE NEGATIVE Final  Comment: (NOTE) SARS-CoV-2 target nucleic acids are NOT DETECTED.  The SARS-CoV-2 RNA is generally detectable in upper respiratory specimens during the acute phase of infection. The lowest concentration of SARS-CoV-2 viral copies this assay can detect is 138 copies/mL. A negative result does not preclude SARS-Cov-2 infection and should not be used as the sole basis for treatment or other patient management decisions. A negative result may occur with  improper specimen collection/handling, submission of specimen other than nasopharyngeal swab, presence of viral mutation(s) within the areas targeted by this assay, and inadequate number of viral copies(<138 copies/mL). A negative result must be combined with clinical observations, patient history, and epidemiological information. The expected result is Negative.  Fact Sheet for Patients:  EntrepreneurPulse.com.au  Fact Sheet for Healthcare Providers:  IncredibleEmployment.be  This test is no t yet approved or cleared by the [place] FDA and  has been authorized for detection and/or diagnosis of SARS-CoV-2 by FDA under an Emergency Use Authorization (EUA). This EUA will remain  in effect (meaning this test can be used) for the duration of the COVID-19 declaration under Section 564(b)(1) of the Act, 21 U.S.C.section 360bbb-3(b)(1), unless the authorization is terminated  or revoked sooner.       Influenza A by PCR NEGATIVE NEGATIVE Final   Influenza B by PCR NEGATIVE NEGATIVE Final    Comment: (NOTE) The Xpert Xpress SARS-CoV-2/FLU/RSV plus assay is intended as an  aid in the diagnosis of influenza from Nasopharyngeal swab specimens and should not be used as a sole basis for treatment. Nasal washings and aspirates are unacceptable for Xpert Xpress SARS-CoV-2/FLU/RSV testing.  Fact Sheet for Patients: EntrepreneurPulse.com.au  Fact Sheet for Healthcare Providers: IncredibleEmployment.be  This test is not yet approved or cleared by the [place] FDA and has been authorized for detection and/or diagnosis of SARS-CoV-2 by FDA under an Emergency Use Authorization (EUA). This EUA will remain in effect (meaning this test can be used) for the duration of the COVID-19 declaration under Section 564(b)(1) of the Act, 21 U.S.C. section 360bbb-3(b)(1), unless the authorization is terminated or revoked.  Performed at [facility], [address], [place], [place] [address]   Urine Culture     Status: None   Collection Time: 11/18/20  4:08 AM   Specimen: In/Out Cath Urine  Result Value Ref Range Status   Specimen Description   Final    IN/OUT CATH URINE Performed at [facility], [address], [place], [place] [address]    Special Requests   Final    NONE Performed at [facility], [address], [place], [place] [address]    Culture   Final    NO GROWTH Performed at [facility], [place] [address] [place], [place] [address]    Report Status 11/19/2020 FINAL  Final  MRSA Next Gen by PCR, Nasal     Status: None   Collection Time: 11/18/20 10:08 PM   Specimen: Nasal Mucosa; Nasal Swab  Result Value Ref Range Status   MRSA by PCR Next Gen NOT DETECTED NOT DETECTED Final    Comment: (NOTE) The GeneXpert MRSA Assay (FDA approved for NASAL specimens only), is one component of a comprehensive MRSA colonization surveillance program. It is not intended to diagnose MRSA infection nor to guide or monitor treatment for MRSA infections. Test performance is not FDA approved in  patients less than 49 years old. Performed at [facility], [address], [place], [place] [address]  Labs: BNP (last 3 results) No results for input(s): BNP in the last 8760 hours. Basic Metabolic Panel: Recent Labs  Lab 11/17/20 1107 11/18/20 0633  NA 131* 133*  K 3.9 3.6  CL 96* 104  CO2 24 22  GLUCOSE 304* 209*  BUN 26* 27*  CREATININE 1.47* 1.05  CALCIUM 8.6* 8.1*   Liver Function Tests: No results for input(s): AST, ALT, ALKPHOS, BILITOT, PROT, ALBUMIN in the last 168 hours. No results for input(s): LIPASE, AMYLASE in the last 168 hours. No results for input(s): AMMONIA in the last 168 hours. CBC: Recent Labs  Lab 11/17/20 1107 11/18/20 0633 11/19/20 0654  WBC 16.5* 14.3* 11.4*  HGB 12.9* 11.3* 10.6*  HCT 38.6* 33.2* 31.4*  MCV 86.7 85.8 86.0  PLT 224 192 185   Cardiac Enzymes: No results for input(s): CKTOTAL, CKMB, CKMBINDEX, TROPONINI in the last 168 hours. BNP: Invalid input(s): POCBNP CBG: Recent Labs  Lab 11/18/20 0842 11/18/20 1132 11/18/20 1559 11/18/20 2127 11/19/20 0808  GLUCAP 211* 214* 167* 181* 162*   D-Dimer No results for input(s): DDIMER in the last 72 hours. Hgb A1c Recent Labs    11/17/20 1942  HGBA1C 9.1*   Lipid Profile No results for input(s): CHOL, HDL, LDLCALC, TRIG, CHOLHDL, LDLDIRECT in the last 72 hours. Thyroid function studies No results for input(s): TSH, T4TOTAL, T3FREE, THYROIDAB in the last 72 hours.  Invalid input(s): FREET3 Anemia work up No results for input(s): VITAMINB12, FOLATE, FERRITIN, TIBC, IRON, RETICCTPCT in the last 72 hours. Urinalysis    Component Value Date/Time   COLORURINE YELLOW 11/18/2020 0408   APPEARANCEUR CLEAR 11/18/2020 0408   LABSPEC 1.020 11/18/2020 0408   PHURINE 5.0 11/18/2020 0408   GLUCOSEU NEGATIVE 11/18/2020 0408   HGBUR SMALL (A) 11/18/2020 0408   BILIRUBINUR NEGATIVE 11/18/2020 0408   KETONESUR NEGATIVE 11/18/2020 0408   PROTEINUR 30 (A) 11/18/2020  0408   UROBILINOGEN 0.2 05/07/2011 1813   NITRITE NEGATIVE 11/18/2020 0408   LEUKOCYTESUR NEGATIVE 11/18/2020 0408   Sepsis Labs Invalid input(s): PROCALCITONIN,  WBC,  LACTICIDVEN Microbiology Recent Results (from the past 240 hour(s))  Blood culture (single)     Status: None (Preliminary result)   Collection Time: 11/17/20  4:38 PM   Specimen: BLOOD  Result Value Ref Range Status   Specimen Description BLOOD BLOOD RIGHT HAND  Final   Special Requests   Final    BOTTLES DRAWN AEROBIC AND ANAEROBIC Blood Culture results may not be optimal due to an inadequate volume of blood received in culture bottles   Culture   Final    NO GROWTH 2 DAYS Performed at [facility], [address] [place], [place] [address]    Report Status PENDING  Incomplete  Resp Panel by RT-PCR (Flu A&B, Covid) Nasopharyngeal Swab     Status: None   Collection Time: 11/17/20  4:39 PM   Specimen: Nasopharyngeal Swab; Nasopharyngeal(NP) swabs in vial transport medium  Result Value Ref Range Status   SARS Coronavirus 2 by RT PCR NEGATIVE NEGATIVE Final    Comment: (NOTE) SARS-CoV-2 target nucleic acids are NOT DETECTED.  The SARS-CoV-2 RNA is generally detectable in upper respiratory specimens during the acute phase of infection. The lowest concentration of SARS-CoV-2 viral copies this assay can detect is 138 copies/mL. A negative result does not preclude SARS-Cov-2 infection and should not be used as the sole basis for treatment or other patient management decisions. A negative result may occur with  improper specimen collection/handling, submission of specimen other  than nasopharyngeal swab, presence of viral mutation(s) within the areas targeted by this assay, and inadequate number of viral copies(<138 copies/mL). A negative result must be combined with clinical observations, patient history, and epidemiological information. The expected result is Negative.  Fact Sheet for Patients:   EntrepreneurPulse.com.au  Fact Sheet for Healthcare Providers:  IncredibleEmployment.be  This test is no t yet approved or cleared by the [place] FDA and  has been authorized for detection and/or diagnosis of SARS-CoV-2 by FDA under an Emergency Use Authorization (EUA). This EUA will remain  in effect (meaning this test can be used) for the duration of the COVID-19 declaration under Section 564(b)(1) of the Act, 21 U.S.C.section 360bbb-3(b)(1), unless the authorization is terminated  or revoked sooner.       Influenza A by PCR NEGATIVE NEGATIVE Final   Influenza B by PCR NEGATIVE NEGATIVE Final    Comment: (NOTE) The Xpert Xpress SARS-CoV-2/FLU/RSV plus assay is intended as an aid in the diagnosis of influenza from Nasopharyngeal swab specimens and should not be used as a sole basis for treatment. Nasal washings and aspirates are unacceptable for Xpert Xpress SARS-CoV-2/FLU/RSV testing.  Fact Sheet for Patients: EntrepreneurPulse.com.au  Fact Sheet for Healthcare Providers: IncredibleEmployment.be  This test is not yet approved or cleared by the [place] FDA and has been authorized for detection and/or diagnosis of SARS-CoV-2 by FDA under an Emergency Use Authorization (EUA). This EUA will remain in effect (meaning this test can be used) for the duration of the COVID-19 declaration under Section 564(b)(1) of the Act, 21 U.S.C. section 360bbb-3(b)(1), unless the authorization is terminated or revoked.  Performed at [facility], [address], [place], [place] [address]   Urine Culture     Status: None   Collection Time: 11/18/20  4:08 AM   Specimen: In/Out Cath Urine  Result Value Ref Range Status   Specimen Description   Final    IN/OUT CATH URINE Performed at [facility], [address], [place], [place] [address]    Special Requests   Final    NONE Performed  at [facility], [address] [place], [place] [address]    Culture   Final    NO GROWTH Performed at [facility], [place] [address], [place], [place] [address]    Report Status 11/19/2020 FINAL  Final  MRSA Next Gen by PCR, Nasal     Status: None   Collection Time: 11/18/20 10:08 PM   Specimen: Nasal Mucosa; Nasal Swab  Result Value Ref Range Status   MRSA by PCR Next Gen NOT DETECTED NOT DETECTED Final    Comment: (NOTE) The GeneXpert MRSA Assay (FDA approved for NASAL specimens only), is one component of a comprehensive MRSA colonization surveillance program. It is not intended to diagnose MRSA infection nor to guide or monitor treatment for MRSA infections. Test performance is not FDA approved in patients less than 20 years old. Performed at [facility], [address], [place], [place] [address]      Time coordinating discharge: Over 30 minutes  SIGNED:   Nolberto Hanlon, MD  [facility] 11/19/2020, 11:00 AM Pager   If 7PM-7AM, please contact night-coverage [email or web address] Password [email or web address]

## 2020-11-22 LAB — CULTURE, BLOOD (SINGLE): Culture: NO GROWTH

## 2021-04-16 ENCOUNTER — Other Ambulatory Visit (INDEPENDENT_AMBULATORY_CARE_PROVIDER_SITE_OTHER): Payer: Self-pay | Admitting: Nurse Practitioner

## 2021-04-16 DIAGNOSIS — L97529 Non-pressure chronic ulcer of other part of left foot with unspecified severity: Secondary | ICD-10-CM

## 2021-04-17 ENCOUNTER — Encounter (INDEPENDENT_AMBULATORY_CARE_PROVIDER_SITE_OTHER): Payer: Self-pay | Admitting: Nurse Practitioner

## 2021-04-17 ENCOUNTER — Ambulatory Visit (INDEPENDENT_AMBULATORY_CARE_PROVIDER_SITE_OTHER): Payer: Medicare Other | Admitting: Nurse Practitioner

## 2021-04-17 ENCOUNTER — Ambulatory Visit (INDEPENDENT_AMBULATORY_CARE_PROVIDER_SITE_OTHER): Payer: Medicare Other

## 2021-04-17 ENCOUNTER — Other Ambulatory Visit: Payer: Self-pay

## 2021-04-17 VITALS — BP 127/72 | HR 87 | Resp 16 | Ht 72.0 in | Wt 220.0 lb

## 2021-04-17 DIAGNOSIS — I10 Essential (primary) hypertension: Secondary | ICD-10-CM | POA: Diagnosis not present

## 2021-04-17 DIAGNOSIS — F17200 Nicotine dependence, unspecified, uncomplicated: Secondary | ICD-10-CM | POA: Diagnosis not present

## 2021-04-17 DIAGNOSIS — E785 Hyperlipidemia, unspecified: Secondary | ICD-10-CM

## 2021-04-17 DIAGNOSIS — L97529 Non-pressure chronic ulcer of other part of left foot with unspecified severity: Secondary | ICD-10-CM

## 2021-04-17 DIAGNOSIS — I739 Peripheral vascular disease, unspecified: Secondary | ICD-10-CM

## 2021-04-17 DIAGNOSIS — E1169 Type 2 diabetes mellitus with other specified complication: Secondary | ICD-10-CM

## 2021-04-24 ENCOUNTER — Encounter (INDEPENDENT_AMBULATORY_CARE_PROVIDER_SITE_OTHER): Payer: Medicare Other

## 2021-04-28 ENCOUNTER — Encounter (INDEPENDENT_AMBULATORY_CARE_PROVIDER_SITE_OTHER): Payer: Self-pay | Admitting: Nurse Practitioner

## 2021-05-01 ENCOUNTER — Encounter (INDEPENDENT_AMBULATORY_CARE_PROVIDER_SITE_OTHER): Payer: Medicare Other

## 2021-05-04 NOTE — Progress Notes (Signed)
Subjective:    Patient ID: Miguel Harvey, male    DOB: 07/30/1945, 76 y.o.   MRN: [id number] Chief Complaint  Patient presents with   Follow-up    Wound check and ultrasound    Miguel Harvey is a 76 year old male that presents today due to concern for a wound on his left lower extremity.  The patient has a previous history of a right below-knee amputation.  The patient notes that the wound occurred due to repeated trauma due to a shower chair.  He notes that based on shower chair he would continually hit his leg and foot against it.  Wants one of the attendings at his nursing facility notices it has been changed and he has not been reinjuring it.  He notes that he just started with wound care this week and has not had wound care done to it prolonged.  He denies any significant pain.  He denies any rest pain.  Today left lower extremity has ABI of 1.03 with a TBI 0.82.  He has monophasic/biphasic waveforms with good toe waveforms.   Review of Systems  Skin:  Positive for wound.  All other systems reviewed and are negative.     Objective:   Physical Exam Vitals reviewed.  HENT:     Head: Normocephalic.  Cardiovascular:     Rate and Rhythm: Normal rate.     Pulses:          Dorsalis pedis pulses are detected w/ Doppler on the right side.       Posterior tibial pulses are detected w/ Doppler on the right side.  Pulmonary:     Effort: Pulmonary effort is normal.  Musculoskeletal:     Right Lower Extremity: Right leg is amputated below knee.  Skin:    General: Skin is warm and dry.  Neurological:     Mental Status: He is alert and oriented to person, place, and time.  Psychiatric:        Mood and Affect: Mood normal.        Behavior: Behavior normal.        Thought Content: Thought content normal.        Judgment: Judgment normal.    BP 127/72 (BP Location: Right Arm)    Pulse 87    Resp 16    Ht 6' (1.829 m)    Wt 220 lb (99.8 kg)    BMI 29.84 kg/m   Past Medical History:   Diagnosis Date   Abnormal posture    CHF (congestive heart failure) (HCC)    Chronic kidney disease    CKD   stage 2   Complication of anesthesia    heart stopped x2 during toe amputation   COPD (chronic obstructive pulmonary disease) (HCC)    Coronary artery disease, non-occlusive    a. LHC 05/2011: 10-20% ostial LAD stenosis   Depression    Diabetes mellitus    HLD (hyperlipidemia)    Hypertension    Muscle weakness    Neuromuscular disorder (HCC)    neuropathy   NICM (nonischemic cardiomyopathy) ([place])    a. TTE 2/213: EF 50-55%, mild LVH, normal wall motion, Gr2DD, mildly dilated LA; b. TTE 2/13: EF 40-45%, mild concentric LVH, global HK, no evidence of LV thrombus; c. TTE 4/19: EF 40%, diffuse HK, Gr2DD, mild MR, mildly dilated LA, RV cavity size normal w/ normal RVSF, PASP 63; d. TTE 4/19: EF 25-30%, diffuse HK, mild LVH, mild MR, mild PASP; e. TTE 5/19:  EF 20-25%, diffuse HK, moderate MR,   NSVT (nonsustained ventricular tachycardia)    PAD (peripheral artery disease) (HCC)    a. s/p right BKA s/p  recent balloon angioplasty fo the left peroneal artery, left posterior tibial artery, tibioperoneal trunk, and stenting to the distal left SFA in 07/2017   PEA (Pulseless electrical activity) ([place])    heart stopped beating during surgery for amputation of toes    Social History   Socioeconomic History   Marital status: Single    Spouse name: danielle .... daughter   Number of children: Not on file   Years of education: Not on file   Highest education level: Not on file  Occupational History   Not on file  Tobacco Use   Smoking status: Every Day    Packs/day: 0.00    Years: 40.00    Pack years: 0.00    Types: Cigarettes   Smokeless tobacco: Former    Quit date: 05/07/2011   Tobacco comments:    3 cigs per day (previous hx of smoking 1 PPD)  Vaping Use   Vaping Use: Never used  Substance and Sexual Activity   Alcohol use: Not Currently    Alcohol/week: 6.0 standard  drinks    Types: 6 Cans of beer per week   Drug use: Never   Sexual activity: Not on file  Other Topics Concern   Not on file  Social History Narrative   Not on file   Social Determinants of Health   Financial Resource Strain: Not on file  Food Insecurity: Not on file  Transportation Needs: Not on file  Physical Activity: Not on file  Stress: Not on file  Social Connections: Not on file  Intimate Partner Violence: Not on file    Past Surgical History:  Procedure Laterality Date   AMPUTATION Right 07/02/2017   Procedure: RIGHT BELOW KNEE AMPUTATION;  Surgeon: Newt Minion, MD;  Location: [facility];  Service: Orthopedics;  Laterality: Right;   CARDIAC CATHETERIZATION     CATARACT EXTRACTION W/PHACO Left 11/08/2018   Procedure: CATARACT EXTRACTION PHACO AND INTRAOCULAR LENS PLACEMENT ([place])  LEFT VISION BLUE;  Surgeon: Marchia Meiers, MD;  Location: [facility];  Service: Ophthalmology;  Laterality: Left;  [place] 03:38.9 CDE 42.37 Fluid pack lot # [id number]   cyst removal from  right leg about 6 years ago     [place] Left 08/10/2017   Procedure: IRRIGATION AND DEBRIDEMENT FOOT;  Surgeon: Samara Deist, DPM;  Location: [facility];  Service: Podiatry;  Laterality: Left;   LEFT HEART CATHETERIZATION WITH CORONARY ANGIOGRAM N/A 05/11/2011   Procedure: LEFT HEART CATHETERIZATION WITH CORONARY ANGIOGRAM;  Surgeon: Troy Sine, MD;  Location[facility] C[facility];  Service: Cardiovascular;  Laterality: N/A;   LOWER EXTREMITY ANGIOGRAPHY Left 08/13/2017   Procedure: Lower Extremity Angiography and PICC line placement;  Surgeon: Algernon Huxley, MD;  Location: [facility];  Service: Cardiovascular;  Laterality: Left;   ruptured navel     repair of   TOE AMPUTATION     not sure of when this surgery occured, but his heart stopped beating x 2 during surgery    Family History  Problem Relation Age of Onset   CAD Other    Diabetes Other     Allergies  Allergen Reactions    Spironolactone     Hyperkalemia (currently takes spironolactone)    CBC Latest Ref Rng & Units 11/19/2020 11/18/2020 11/17/2020  WBC 4.0 - 10.5 K/uL 11.4(H)  14.3(H) 16.5(H)  Hemoglobin 13.0 - 17.0 g/dL 10.6(L) 11.3(L) 12.9(L)  Hematocrit 39.0 - 52.0 % 31.4(L) 33.2(L) 38.6(L)  Platelets 150 - 400 K/uL 185 192 224      CMP     Component Value Date/Time   NA 133 (L) 11/18/2020 0633   NA 141 11/07/2019 1439   K 3.6 11/18/2020 0633   CL 104 11/18/2020 0633   CO2 22 11/18/2020 0633   GLUCOSE 209 (H) 11/18/2020 0633   BUN 27 (H) 11/18/2020 0633   BUN 14 11/07/2019 1439   CREATININE 1.05 11/18/2020 0633   CREATININE 1.43 (H) 09/21/2012 1150   CALCIUM 8.1 (L) 11/18/2020 0633   PROT 6.8 08/05/2017 1152   ALBUMIN 2.4 (L) 08/05/2017 1152   AST 25 08/05/2017 1152   ALT 30 08/05/2017 1152   ALKPHOS 217 (H) 08/05/2017 1152   BILITOT 0.8 08/05/2017 1152   GFRNONAA >60 11/18/2020 0633   GFRAA 73 11/07/2019 1439     VAS [place] ABI WITH/WO TBI  Result Date: 04/23/2021  LOWER EXTREMITY DOPPLER STUDY Patient Name:  Miguel Harvey  Date of Exam:   04/17/2021 Medical Rec #: [id number]      Accession #:    [id number] Date of Birth: 09/10/1945     Patient Gender: M Patient Age:   41 years Exam Location:  [facility] Procedure:      VAS [place] ABI WITH/WO TBI Referring Phys: --------------------------------------------------------------------------------  Indications: Peripheral artery disease, and Right BKA.  Performing Technologist: Concha Norway RVT  Examination Guidelines: A complete evaluation includes at minimum, Doppler waveform signals and systolic blood pressure reading at the level of bilateral brachial, anterior tibial, and posterior tibial arteries, when vessel segments are accessible. Bilateral testing is considered an integral part of a complete examination. Photoelectric Plethysmograph (PPG) waveforms and toe systolic pressure readings are included as required and additional duplex testing  as needed. Limited examinations for reoccurring indications may be performed as noted.  ABI Findings: +--------+------------------+-----+--------+--------+  Right    Rt Pressure (mmHg) Index Waveform Comment   +--------+------------------+-----+--------+--------+  Brachial 140                                         +--------+------------------+-----+--------+--------+ +---------+------------------+-----+----------+-------+  Left      Lt Pressure (mmHg) Index Waveform   Comment  +---------+------------------+-----+----------+-------+  ATA       145                      monophasic          +---------+------------------+-----+----------+-------+  PTA       144                1.03  biphasic            +---------+------------------+-----+----------+-------+  Great Toe 116                0.83  Normal              +---------+------------------+-----+----------+-------+ +-------+-----------+-----------+------------+------------+  ABI/TBI Today's ABI Today's TBI Previous ABI Previous TBI  +-------+-----------+-----------+------------+------------+  Left    1.03        .82         1.01         .91           +-------+-----------+-----------+------------+------------+ Left ABIs and TBIs appear essentially unchanged compared to prior study on  07/10/2020.  Summary: Right: Rt BKA. Left: Resting left ankle-brachial index is within normal range. No evidence of significant left lower extremity arterial disease. The left toe-brachial index is normal.  *See table(s) above for measurements and observations.  Electronically signed by Leotis Pain MD on 04/23/2021 at 10:49:32 AM.    Final        Assessment & Plan:   1. PAD (peripheral artery disease) (HCC) Patient's noninvasive studies show that he should have adequate perfusion for wound healing.  He will continue with wound care as he just started this week.  We will have her return in 4 weeks in order to reevaluate his progression and ensure that he is healing without issue.  2.  Hyperlipidemia associated with type 2 diabetes mellitus ([place]) Continue statin as ordered and reviewed, no changes at this time   3. HTN (hypertension), malignant Continue antihypertensive medications as already ordered, these medications have been reviewed and there are no changes at this time.   4. TOBACCO DEPENDENCE Smoking cessation was discussed, 3-10 minutes spent on this topic specifically    Current Outpatient Medications on File Prior to Visit  Medication Sig Dispense Refill   aspirin EC 81 MG EC tablet Take 1 tablet (81 mg total) by mouth daily. 30 tablet 0   atorvastatin (LIPITOR) 20 MG tablet Take 20 mg by mouth every evening.      carvedilol (COREG) 6.25 MG tablet Take 6.25 mg by mouth 2 (two) times daily with a meal.     ciclopirox (LOPROX) 0.77 % cream Apply 1 application topically daily.     clopidogrel (PLAVIX) 75 MG tablet Take 1 tablet (75 mg total) by mouth daily. 30 tablet 0   Dulaglutide (TRULICITY) 1.5 0000000 SOPN Inject 1.5 mg into the skin every Friday.     insulin lispro (HUMALOG) 100 UNIT/ML KwikPen Inject 5 Units into the skin 3 (three) times daily. HOLD IF CBG < or = 150     LANTUS SOLOSTAR 100 UNIT/ML Solostar Pen Inject 5 Units into the skin daily.      linagliptin (TRADJENTA) 5 MG TABS tablet Take 5 mg by mouth daily.     Magnesium 400 MG TABS Take 400 mg by mouth 2 (two) times daily.      metFORMIN (GLUCOPHAGE) 500 MG tablet Take 500 mg by mouth 2 (two) times daily with a meal.      Multiple Vitamin (MULTIVITAMIN WITH MINERALS) TABS tablet Take 1 tablet by mouth daily.     Probiotic Product (ALIGN) 4 MG CAPS Take 4 mg by mouth daily.     sertraline (ZOLOFT) 25 MG tablet Take 25 mg by mouth daily.      tamsulosin (FLOMAX) 0.4 MG CAPS capsule Take 1 capsule (0.4 mg total) by mouth daily. 30 capsule 0   No current facility-administered medications on file prior to visit.    There are no Patient Instructions on file for this visit. No follow-ups on  file.   Kris Hartmann, NP

## 2021-05-08 ENCOUNTER — Emergency Department: Payer: Commercial Managed Care - HMO

## 2021-05-08 ENCOUNTER — Inpatient Hospital Stay
Admission: EM | Admit: 2021-05-08 | Discharge: 2021-05-14 | DRG: 854 | Disposition: A | Discharge status: Skilled Nursing Facility | Payer: Commercial Managed Care - HMO | Source: Skilled Nursing Facility | Attending: Student in an Organized Health Care Education/Training Program | Admitting: Student in an Organized Health Care Education/Training Program

## 2021-05-08 ENCOUNTER — Other Ambulatory Visit: Payer: Self-pay

## 2021-05-08 ENCOUNTER — Encounter (INDEPENDENT_AMBULATORY_CARE_PROVIDER_SITE_OTHER): Payer: Medicare Other

## 2021-05-08 DIAGNOSIS — L8990 Pressure ulcer of unspecified site, unspecified stage: Secondary | ICD-10-CM | POA: Diagnosis present

## 2021-05-08 DIAGNOSIS — E11621 Type 2 diabetes mellitus with foot ulcer: Secondary | ICD-10-CM | POA: Diagnosis present

## 2021-05-08 DIAGNOSIS — E669 Obesity, unspecified: Secondary | ICD-10-CM | POA: Diagnosis present

## 2021-05-08 DIAGNOSIS — W050XXA Fall from non-moving wheelchair, initial encounter: Secondary | ICD-10-CM | POA: Diagnosis present

## 2021-05-08 DIAGNOSIS — E118 Type 2 diabetes mellitus with unspecified complications: Secondary | ICD-10-CM | POA: Diagnosis not present

## 2021-05-08 DIAGNOSIS — I878 Other specified disorders of veins: Secondary | ICD-10-CM | POA: Diagnosis present

## 2021-05-08 DIAGNOSIS — L089 Local infection of the skin and subcutaneous tissue, unspecified: Secondary | ICD-10-CM | POA: Diagnosis not present

## 2021-05-08 DIAGNOSIS — Z794 Long term (current) use of insulin: Secondary | ICD-10-CM | POA: Diagnosis not present

## 2021-05-08 DIAGNOSIS — N182 Chronic kidney disease, stage 2 (mild): Secondary | ICD-10-CM | POA: Diagnosis present

## 2021-05-08 DIAGNOSIS — D638 Anemia in other chronic diseases classified elsewhere: Secondary | ICD-10-CM | POA: Diagnosis not present

## 2021-05-08 DIAGNOSIS — Z8249 Family history of ischemic heart disease and other diseases of the circulatory system: Secondary | ICD-10-CM | POA: Diagnosis not present

## 2021-05-08 DIAGNOSIS — N189 Chronic kidney disease, unspecified: Secondary | ICD-10-CM | POA: Diagnosis not present

## 2021-05-08 DIAGNOSIS — M86172 Other acute osteomyelitis, left ankle and foot: Secondary | ICD-10-CM | POA: Diagnosis present

## 2021-05-08 DIAGNOSIS — E1122 Type 2 diabetes mellitus with diabetic chronic kidney disease: Secondary | ICD-10-CM | POA: Diagnosis present

## 2021-05-08 DIAGNOSIS — E1142 Type 2 diabetes mellitus with diabetic polyneuropathy: Secondary | ICD-10-CM | POA: Diagnosis present

## 2021-05-08 DIAGNOSIS — Z833 Family history of diabetes mellitus: Secondary | ICD-10-CM

## 2021-05-08 DIAGNOSIS — E1151 Type 2 diabetes mellitus with diabetic peripheral angiopathy without gangrene: Secondary | ICD-10-CM | POA: Diagnosis present

## 2021-05-08 DIAGNOSIS — Z89511 Acquired absence of right leg below knee: Secondary | ICD-10-CM | POA: Diagnosis not present

## 2021-05-08 DIAGNOSIS — F17200 Nicotine dependence, unspecified, uncomplicated: Secondary | ICD-10-CM | POA: Diagnosis present

## 2021-05-08 DIAGNOSIS — D631 Anemia in chronic kidney disease: Secondary | ICD-10-CM | POA: Diagnosis present

## 2021-05-08 DIAGNOSIS — I739 Peripheral vascular disease, unspecified: Secondary | ICD-10-CM | POA: Diagnosis not present

## 2021-05-08 DIAGNOSIS — I130 Hypertensive heart and chronic kidney disease with heart failure and stage 1 through stage 4 chronic kidney disease, or unspecified chronic kidney disease: Secondary | ICD-10-CM | POA: Diagnosis present

## 2021-05-08 DIAGNOSIS — Z7982 Long term (current) use of aspirin: Secondary | ICD-10-CM

## 2021-05-08 DIAGNOSIS — Z6829 Body mass index (BMI) 29.0-29.9, adult: Secondary | ICD-10-CM

## 2021-05-08 DIAGNOSIS — Z79899 Other long term (current) drug therapy: Secondary | ICD-10-CM

## 2021-05-08 DIAGNOSIS — A419 Sepsis, unspecified organism: Principal | ICD-10-CM | POA: Diagnosis present

## 2021-05-08 DIAGNOSIS — I5022 Chronic systolic (congestive) heart failure: Secondary | ICD-10-CM | POA: Diagnosis present

## 2021-05-08 DIAGNOSIS — E1169 Type 2 diabetes mellitus with other specified complication: Secondary | ICD-10-CM | POA: Diagnosis present

## 2021-05-08 DIAGNOSIS — Z7985 Long-term (current) use of injectable non-insulin antidiabetic drugs: Secondary | ICD-10-CM

## 2021-05-08 DIAGNOSIS — Y92129 Unspecified place in nursing home as the place of occurrence of the external cause: Secondary | ICD-10-CM

## 2021-05-08 DIAGNOSIS — R6510 Systemic inflammatory response syndrome (SIRS) of non-infectious origin without acute organ dysfunction: Secondary | ICD-10-CM

## 2021-05-08 DIAGNOSIS — Z888 Allergy status to other drugs, medicaments and biological substances status: Secondary | ICD-10-CM

## 2021-05-08 DIAGNOSIS — J449 Chronic obstructive pulmonary disease, unspecified: Secondary | ICD-10-CM | POA: Diagnosis not present

## 2021-05-08 DIAGNOSIS — Z7984 Long term (current) use of oral hypoglycemic drugs: Secondary | ICD-10-CM

## 2021-05-08 DIAGNOSIS — Z9842 Cataract extraction status, left eye: Secondary | ICD-10-CM | POA: Diagnosis not present

## 2021-05-08 DIAGNOSIS — M869 Osteomyelitis, unspecified: Secondary | ICD-10-CM | POA: Diagnosis present

## 2021-05-08 DIAGNOSIS — F17210 Nicotine dependence, cigarettes, uncomplicated: Secondary | ICD-10-CM | POA: Diagnosis present

## 2021-05-08 DIAGNOSIS — L97529 Non-pressure chronic ulcer of other part of left foot with unspecified severity: Secondary | ICD-10-CM | POA: Diagnosis present

## 2021-05-08 DIAGNOSIS — E1165 Type 2 diabetes mellitus with hyperglycemia: Secondary | ICD-10-CM | POA: Diagnosis present

## 2021-05-08 DIAGNOSIS — W19XXXA Unspecified fall, initial encounter: Secondary | ICD-10-CM | POA: Diagnosis not present

## 2021-05-08 DIAGNOSIS — E785 Hyperlipidemia, unspecified: Secondary | ICD-10-CM | POA: Diagnosis present

## 2021-05-08 DIAGNOSIS — Z961 Presence of intraocular lens: Secondary | ICD-10-CM | POA: Diagnosis present

## 2021-05-08 DIAGNOSIS — Z20822 Contact with and (suspected) exposure to covid-19: Secondary | ICD-10-CM | POA: Diagnosis present

## 2021-05-08 DIAGNOSIS — Z7902 Long term (current) use of antithrombotics/antiplatelets: Secondary | ICD-10-CM

## 2021-05-08 DIAGNOSIS — R7881 Bacteremia: Secondary | ICD-10-CM | POA: Diagnosis present

## 2021-05-08 DIAGNOSIS — E119 Type 2 diabetes mellitus without complications: Secondary | ICD-10-CM | POA: Diagnosis not present

## 2021-05-08 DIAGNOSIS — B954 Other streptococcus as the cause of diseases classified elsewhere: Secondary | ICD-10-CM | POA: Diagnosis present

## 2021-05-08 DIAGNOSIS — I10 Essential (primary) hypertension: Secondary | ICD-10-CM | POA: Diagnosis present

## 2021-05-08 DIAGNOSIS — M86171 Other acute osteomyelitis, right ankle and foot: Secondary | ICD-10-CM | POA: Diagnosis present

## 2021-05-08 DIAGNOSIS — I2510 Atherosclerotic heart disease of native coronary artery without angina pectoris: Secondary | ICD-10-CM | POA: Diagnosis present

## 2021-05-08 DIAGNOSIS — I428 Other cardiomyopathies: Secondary | ICD-10-CM | POA: Diagnosis present

## 2021-05-08 LAB — COMPREHENSIVE METABOLIC PANEL
ALT: 14 U/L (ref 0–44)
AST: 16 U/L (ref 15–41)
Albumin: 3.1 g/dL — ABNORMAL LOW (ref 3.5–5.0)
Alkaline Phosphatase: 54 U/L (ref 38–126)
Anion gap: 8 (ref 5–15)
BUN: 17 mg/dL (ref 8–23)
CO2: 22 mmol/L (ref 22–32)
Calcium: 8.1 mg/dL — ABNORMAL LOW (ref 8.9–10.3)
Chloride: 100 mmol/L (ref 98–111)
Creatinine, Ser: 1.23 mg/dL (ref 0.61–1.24)
GFR, Estimated: >60 mL/min (ref >60)
Glucose, Bld: 279 mg/dL — ABNORMAL HIGH (ref 70–99)
Potassium: 4.3 mmol/L (ref 3.5–5.1)
Sodium: 130 mmol/L — ABNORMAL LOW (ref 135–145)
Total Bilirubin: 0.7 mg/dL (ref 0.3–1.2)
Total Protein: 7.1 g/dL (ref 6.5–8.1)

## 2021-05-08 LAB — CREATININE, SERUM
Creatinine, Ser: 1.31 mg/dL — ABNORMAL HIGH (ref 0.61–1.24)
GFR, Estimated: 56 mL/min — ABNORMAL LOW (ref >60)

## 2021-05-08 LAB — CBC WITH DIFFERENTIAL/PLATELET
Abs Immature Granulocytes: 0.06 10*3/uL (ref 0.00–0.07)
Basophils Absolute: 0 10*3/uL (ref 0.0–0.1)
Basophils Relative: 0 %
Eosinophils Absolute: 0 10*3/uL (ref 0.0–0.5)
Eosinophils Relative: 0 %
HCT: 34.5 % — ABNORMAL LOW (ref 39.0–52.0)
Hemoglobin: 11.2 g/dL — ABNORMAL LOW (ref 13.0–17.0)
Immature Granulocytes: 0 %
Lymphocytes Relative: 6 %
Lymphs Abs: 0.9 10*3/uL (ref 0.7–4.0)
MCH: 27.1 pg (ref 26.0–34.0)
MCHC: 32.5 g/dL (ref 30.0–36.0)
MCV: 83.5 fL (ref 80.0–100.0)
Monocytes Absolute: 0.6 10*3/uL (ref 0.1–1.0)
Monocytes Relative: 4 %
Neutro Abs: 14.3 10*3/uL — ABNORMAL HIGH (ref 1.7–7.7)
Neutrophils Relative %: 90 %
Platelets: 302 10*3/uL (ref 150–400)
RBC: 4.13 MIL/uL — ABNORMAL LOW (ref 4.22–5.81)
RDW: 13.8 % (ref 11.5–15.5)
WBC: 15.8 10*3/uL — ABNORMAL HIGH (ref 4.0–10.5)
nRBC: 0 % (ref 0.0–0.2)

## 2021-05-08 LAB — URINALYSIS, COMPLETE (UACMP) WITH MICROSCOPIC
Bilirubin Urine: NEGATIVE
Glucose, UA: 50 mg/dL — AB
Hgb urine dipstick: NEGATIVE
Ketones, ur: 5 mg/dL — AB
Leukocytes,Ua: NEGATIVE
Nitrite: NEGATIVE
Protein, ur: 30 mg/dL — AB
Specific Gravity, Urine: 1.017 (ref 1.005–1.030)
Squamous Epithelial / HPF: NONE SEEN (ref 0–5)
pH: 5 (ref 5.0–8.0)

## 2021-05-08 LAB — CBG MONITORING, ED: Glucose-Capillary: 281 mg/dL — ABNORMAL HIGH (ref 70–99)

## 2021-05-08 LAB — PROCALCITONIN: Procalcitonin: 0.33 ng/mL

## 2021-05-08 LAB — RESP PANEL BY RT-PCR (FLU A&B, COVID) ARPGX2
Influenza A by PCR: NEGATIVE
Influenza B by PCR: NEGATIVE
SARS Coronavirus 2 by RT PCR: NEGATIVE

## 2021-05-08 LAB — BETA-HYDROXYBUTYRIC ACID: Beta-Hydroxybutyric Acid: 0.63 mmol/L — ABNORMAL HIGH (ref 0.05–0.27)

## 2021-05-08 LAB — LACTIC ACID, PLASMA
Lactic Acid, Venous: 1.8 mmol/L (ref 0.5–1.9)
Lactic Acid, Venous: 1.6 mmol/L (ref 0.5–1.9)

## 2021-05-08 LAB — GLUCOSE, CAPILLARY: Glucose-Capillary: 167 mg/dL — ABNORMAL HIGH (ref 70–99)

## 2021-05-08 LAB — APTT: aPTT: 33 seconds (ref 24–36)

## 2021-05-08 LAB — PROTIME-INR
INR: 1.2 (ref 0.8–1.2)
Prothrombin Time: 15.3 seconds — ABNORMAL HIGH (ref 11.4–15.2)

## 2021-05-08 MED ORDER — LACTATED RINGERS IV BOLUS
1000.0000 mL | Freq: Once | INTRAVENOUS | Status: AC
  Administered 2021-05-08: 1000 mL via INTRAVENOUS

## 2021-05-08 MED ORDER — ACETAMINOPHEN 500 MG PO TABS
1000.0000 mg | ORAL_TABLET | Freq: Once | ORAL | Status: AC
  Administered 2021-05-08: 1000 mg via ORAL
  Filled 2021-05-08: qty 2

## 2021-05-08 MED ORDER — ACETAMINOPHEN 650 MG RE SUPP: 650.0000 mg | Freq: Four times a day (QID) | RECTAL | Status: DC | PRN

## 2021-05-08 MED ORDER — ONDANSETRON HCL 4 MG/2ML IJ SOLN: 4.0000 mg | Freq: Four times a day (QID) | INTRAMUSCULAR | Status: DC | PRN

## 2021-05-08 MED ORDER — MAGNESIUM 400 MG PO TABS
400.0000 mg | ORAL_TABLET | Freq: Two times a day (BID) | ORAL | Status: DC
  Filled 2021-05-08 (×2): qty 1

## 2021-05-08 MED ORDER — MAGNESIUM OXIDE -MG SUPPLEMENT 400 (240 MG) MG PO TABS
400.0000 mg | ORAL_TABLET | Freq: Two times a day (BID) | ORAL | Status: DC
  Administered 2021-05-09 – 2021-05-14 (×11): 400 mg via ORAL
  Filled 2021-05-08 (×11): qty 1

## 2021-05-08 MED ORDER — ONDANSETRON HCL 4 MG PO TABS: 4.0000 mg | ORAL_TABLET | Freq: Four times a day (QID) | ORAL | Status: DC | PRN

## 2021-05-08 MED ORDER — CARVEDILOL 6.25 MG PO TABS
6.2500 mg | ORAL_TABLET | Freq: Two times a day (BID) | ORAL | Status: DC
  Administered 2021-05-08 – 2021-05-14 (×13): 6.25 mg via ORAL
  Filled 2021-05-08 (×13): qty 1

## 2021-05-08 MED ORDER — BISACODYL 5 MG PO TBEC: 5.0000 mg | DELAYED_RELEASE_TABLET | Freq: Every day | ORAL | Status: DC | PRN

## 2021-05-08 MED ORDER — LINAGLIPTIN 5 MG PO TABS
5.0000 mg | ORAL_TABLET | Freq: Every day | ORAL | Status: DC
  Administered 2021-05-09 – 2021-05-14 (×5): 5 mg via ORAL
  Filled 2021-05-08 (×7): qty 1

## 2021-05-08 MED ORDER — SODIUM CHLORIDE 0.9 % IV SOLN
1.0000 g | Freq: Once | INTRAVENOUS | Status: AC
  Administered 2021-05-08: 1 g via INTRAVENOUS
  Filled 2021-05-08: qty 10

## 2021-05-08 MED ORDER — SODIUM CHLORIDE 0.9 % IV SOLN
2.0000 g | Freq: Two times a day (BID) | INTRAVENOUS | Status: DC
  Filled 2021-05-08 (×2): qty 2

## 2021-05-08 MED ORDER — ASPIRIN EC 81 MG PO TBEC
81.0000 mg | DELAYED_RELEASE_TABLET | Freq: Every day | ORAL | Status: DC
  Administered 2021-05-09 – 2021-05-14 (×5): 81 mg via ORAL
  Filled 2021-05-08 (×5): qty 1

## 2021-05-08 MED ORDER — ACETAMINOPHEN 325 MG PO TABS
650.0000 mg | ORAL_TABLET | Freq: Four times a day (QID) | ORAL | Status: DC | PRN
  Administered 2021-05-12 – 2021-05-13 (×5): 650 mg via ORAL
  Filled 2021-05-08 (×5): qty 2

## 2021-05-08 MED ORDER — INSULIN ASPART 100 UNIT/ML IJ SOLN
0.0000 [IU] | INTRAMUSCULAR | Status: DC
  Administered 2021-05-08: 3 [IU] via SUBCUTANEOUS
  Administered 2021-05-08: 17:00:00 8 [IU] via SUBCUTANEOUS
  Administered 2021-05-09 (×2): 2 [IU] via SUBCUTANEOUS
  Administered 2021-05-10: 12:00:00 3 [IU] via SUBCUTANEOUS
  Administered 2021-05-10: 5 [IU] via SUBCUTANEOUS
  Administered 2021-05-10 (×3): 3 [IU] via SUBCUTANEOUS
  Administered 2021-05-11: 5 [IU] via SUBCUTANEOUS
  Administered 2021-05-11 (×4): 3 [IU] via SUBCUTANEOUS
  Administered 2021-05-12: 2 [IU] via SUBCUTANEOUS
  Administered 2021-05-12 (×2): 3 [IU] via SUBCUTANEOUS
  Filled 2021-05-08 (×17): qty 1

## 2021-05-08 MED ORDER — VANCOMYCIN HCL 2000 MG/400ML IV SOLN
2000.0000 mg | Freq: Once | INTRAVENOUS | Status: AC
  Administered 2021-05-08: 2000 mg via INTRAVENOUS
  Filled 2021-05-08 (×2): qty 400

## 2021-05-08 MED ORDER — ADULT MULTIVITAMIN W/MINERALS CH
1.0000 | ORAL_TABLET | Freq: Every day | ORAL | Status: DC
  Administered 2021-05-09 – 2021-05-14 (×5): 1 via ORAL
  Filled 2021-05-08 (×5): qty 1

## 2021-05-08 MED ORDER — LACTATED RINGERS IV SOLN: INTRAVENOUS | Status: AC

## 2021-05-08 MED ORDER — FUROSEMIDE 20 MG PO TABS
20.0000 mg | ORAL_TABLET | Freq: Every day | ORAL | Status: DC
  Administered 2021-05-09 – 2021-05-14 (×5): 20 mg via ORAL
  Filled 2021-05-08 (×5): qty 1

## 2021-05-08 MED ORDER — SERTRALINE HCL 50 MG PO TABS
25.0000 mg | ORAL_TABLET | Freq: Every day | ORAL | Status: DC
  Administered 2021-05-09 – 2021-05-14 (×5): 25 mg via ORAL
  Filled 2021-05-08 (×5): qty 1

## 2021-05-08 MED ORDER — INSULIN GLARGINE-YFGN 100 UNIT/ML ~~LOC~~ SOLN
8.0000 [IU] | Freq: Every day | SUBCUTANEOUS | Status: DC
  Administered 2021-05-08 – 2021-05-13 (×6): 8 [IU] via SUBCUTANEOUS
  Filled 2021-05-08 (×7): qty 0.08

## 2021-05-08 MED ORDER — CLOPIDOGREL BISULFATE 75 MG PO TABS
75.0000 mg | ORAL_TABLET | Freq: Every day | ORAL | Status: DC
  Administered 2021-05-09 – 2021-05-14 (×5): 75 mg via ORAL
  Filled 2021-05-08 (×5): qty 1

## 2021-05-08 MED ORDER — SODIUM CHLORIDE 0.9 % IV SOLN
2.0000 g | Freq: Three times a day (TID) | INTRAVENOUS | Status: DC
  Administered 2021-05-08: 2 g via INTRAVENOUS
  Filled 2021-05-08 (×2): qty 2

## 2021-05-08 MED ORDER — ENOXAPARIN SODIUM 40 MG/0.4ML IJ SOSY
40.0000 mg | PREFILLED_SYRINGE | INTRAMUSCULAR | Status: DC
  Administered 2021-05-08 – 2021-05-13 (×6): 40 mg via SUBCUTANEOUS
  Filled 2021-05-08 (×6): qty 0.4

## 2021-05-08 MED ORDER — METRONIDAZOLE 500 MG/100ML IV SOLN
500.0000 mg | Freq: Once | INTRAVENOUS | Status: AC
Start: 2021-05-08 — End: 2021-05-08
  Administered 2021-05-08: 500 mg via INTRAVENOUS
  Filled 2021-05-08: qty 100

## 2021-05-08 MED ORDER — TAMSULOSIN HCL 0.4 MG PO CAPS
0.4000 mg | ORAL_CAPSULE | Freq: Every day | ORAL | Status: DC
  Administered 2021-05-09 – 2021-05-14 (×5): 0.4 mg via ORAL
  Filled 2021-05-08 (×5): qty 1

## 2021-05-08 MED ORDER — SENNA 8.6 MG PO TABS
1.0000 | ORAL_TABLET | Freq: Two times a day (BID) | ORAL | Status: DC
  Administered 2021-05-10 – 2021-05-14 (×4): 8.6 mg via ORAL
  Filled 2021-05-08 (×10): qty 1

## 2021-05-08 MED ORDER — ATORVASTATIN CALCIUM 20 MG PO TABS
20.0000 mg | ORAL_TABLET | Freq: Every evening | ORAL | Status: DC
  Administered 2021-05-08 – 2021-05-14 (×7): 20 mg via ORAL
  Filled 2021-05-08 (×7): qty 1

## 2021-05-08 NOTE — Assessment & Plan Note (Addendum)
Present on admission with fever of 102.9 and leukocytosis 15.8.  Ssource is left great toe osteomyelitis.  Treated per sepsis protocol on admission.  Continue IV antibiotics and follow cultures.  Monitor hemodynamics closely.  Sepsis physiology has resolved.

## 2021-05-08 NOTE — Assessment & Plan Note (Addendum)
Blood pressure labile on admission. Continue home Coreg and Lasix.   Monitor blood pressure.

## 2021-05-08 NOTE — Progress Notes (Addendum)
Pharmacy Antibiotic Note  Miguel Harvey is a 76 y.o. male admitted on 05/08/2021. Pharmacy has been consulted for cefepime dosing for osteomyelitis.  Plan: Cefepime 2 g IV q12h based on CrCl 58.7 ml/min  Height: 6' (182.9 cm) Weight: 99.8 kg (220 lb) IBW/kg (Calculated) : 77.6  Temp (24hrs), Avg:100.2 F (37.9 C), Min:98.8 F (37.1 C), Max:102.9 F (39.4 C)  Recent Labs  Lab 05/08/21 1230  WBC 15.8*  CREATININE 1.23  LATICACIDVEN 1.8    Estimated Creatinine Clearance: 62.5 mL/min (by C-G formula based on SCr of 1.23 mg/dL).    Allergies  Allergen Reactions   Spironolactone     Hyperkalemia (currently takes spironolactone)    Antimicrobials this admission: Cefepime 2/9 >> Ceftriaxone 2/9 x 1 Vancomycin 2/9 x 1  Microbiology results: 2/9 BCx: pending   Thank you for allowing pharmacy to be a part of this patients care.  Pricilla Riffle, PharmD, BCPS Clinical Pharmacist 05/08/2021 6:04 PM

## 2021-05-08 NOTE — Assessment & Plan Note (Signed)
-  Continue Lipitor °

## 2021-05-08 NOTE — Assessment & Plan Note (Signed)
Counseled on importance of cessation 

## 2021-05-08 NOTE — Progress Notes (Signed)
Elink is following Code Sepsis bundle. °

## 2021-05-08 NOTE — Progress Notes (Signed)
PHARMACY -  BRIEF ANTIBIOTIC NOTE   Pharmacy has received consult(s) for vancomycin from an ED provider.  The patient's profile has been reviewed for ht/wt/allergies/indication/available labs.    One time order(s) placed for vancomycin 2 g  Further antibiotics/pharmacy consults should be ordered by admitting physician if indicated.                       Thank you,  Pricilla Riffle, PharmD, BCPS Clinical Pharmacist 05/08/2021 5:04 PM

## 2021-05-08 NOTE — ED Notes (Signed)
Per Darl Pikes in the lab, no COVID swab was found on pt so pt will need one sent.

## 2021-05-08 NOTE — Assessment & Plan Note (Addendum)
Acuity is not clear by the x-ray. MRI left foot confirmed acute osteomyelitis. Initially on broad-spectrum IV antibiotics. Transition Unasyn >> Augmentin x 7 days.  Status post left great toe amputation on 2/12. Pain control PRN. Consults: Podiatry, Infectious Disease

## 2021-05-08 NOTE — Assessment & Plan Note (Addendum)
With prior right BKA, followed by vascular surgery.  Now with left great toe osteomyelitis status post amputation.   Continue aspirin and Plavix.

## 2021-05-08 NOTE — ED Provider Notes (Signed)
Patient received in signout from Dr. Katrinka Blazing pending follow-up labs and reassessment.  Patient is SIRS positive procalcitonin elevated and presentation concerning for sepsis but normotensive normal lactate.  Received IV fluids IV insulin was given Rocephin.  Urinalysis did not show significant bacteria.  His repeat abdominal exam is soft benign.  On exam he does have discharge coming from his left great toe foot is warm.  X-ray ordered and on my review of the foot x-ray does appear that he is got osteomyelitis of the left toe.  Will broaden antibiotic coverage and consult with hospitalist for admission.   Willy Eddy, MD 05/08/21 [phone]

## 2021-05-08 NOTE — H&P (Signed)
History and Physical    Patient: Miguel Harvey [id number] DOB: 09/25/1945 DOA: 05/08/2021 DOS: the patient was seen and examined on 05/08/2021 PCP: Garwin Brothers, MD  Patient coming from: [facility]  Chief Complaint:  Chief Complaint  Patient presents with   Fall    HPI: [name] is a 76 y.o. male with medical history significant of PAD status post right BKA followed by vascular surgery, CAD, type 2 diabetes, hypertension, hyperlipidemia, nonischemic cardiomyopathy and tobacco abuse who presented to the ED today with generalized weakness and falls at home.  He was febrile with a temp of 102.9 in the ED.  At time of my encounter, patient somnolent but arousable.  He quickly falls back to sleep without offering any further history however.  Most history obtained from ED provider and chart review.  Patient reportedly had an unwitnessed fall at his nursing home last night.  He apparently fell out of his wheelchair but unsure how this occurred.  For me, patient denies any acute pain or injuries from the fall.  He is unable to tell me any specific acute complaints at this time.  When asked about admission to the hospital for antibiotics he states "whatever" and closes his eyes without further interaction.  Evaluation in the ED was notable for fever 102.9, otherwise stable vitals but intermittent tachycardia with heart rate as high as 109, labile BP as low as 106/59 but as high as 153/75.  Stable O2 sats on room air.  Labs were notable for white count of 15.8, glucose of 279 but without metabolic acidosis or elevated anion gap.  Procalcitonin 0.33.  Lactic acid 1.8.  COVID and influenza A/B-.  Urinalysis negative for infection.  CT of head and C-spine were negative for anything acute.  Chest x-ray negative for anything acute.  Left foot x-ray obtained due to a draining ulcer on the plantar aspect of the left first toe and no other source of fever identified.  It showed changes of possible  osteomyelitis (see report).    Review of Systems: Unable to review all systems due to lack of cooperation from patient. Past Medical History:  Diagnosis Date   Abnormal posture    CHF (congestive heart failure) (HCC)    Chronic kidney disease    CKD   stage 2   Complication of anesthesia    heart stopped x2 during toe amputation   COPD (chronic obstructive pulmonary disease) (HCC)    Coronary artery disease, non-occlusive    a. LHC 05/2011: 10-20% ostial LAD stenosis   Depression    Diabetes mellitus    HLD (hyperlipidemia)    Hypertension    Muscle weakness    Neuromuscular disorder (HCC)    neuropathy   NICM (nonischemic cardiomyopathy) ([place])    a. TTE 2/213: EF 50-55%, mild LVH, normal wall motion, Gr2DD, mildly dilated LA; b. TTE 2/13: EF 40-45%, mild concentric LVH, global HK, no evidence of LV thrombus; c. TTE 4/19: EF 40%, diffuse HK, Gr2DD, mild MR, mildly dilated LA, RV cavity size normal w/ normal RVSF, PASP 63; d. TTE 4/19: EF 25-30%, diffuse HK, mild LVH, mild MR, mild PASP; e. TTE 5/19: EF 20-25%, diffuse HK, moderate MR,   NSVT (nonsustained ventricular tachycardia)    PAD (peripheral artery disease) ([place])    a. s/p right BKA s/p  recent balloon angioplasty fo the left peroneal artery, left posterior tibial artery, tibioperoneal trunk, and stenting to the distal left SFA in 07/2017   PEA (Pulseless  electrical activity) ([place])    heart stopped beating during surgery for amputation of toes   Past Surgical History:  Procedure Laterality Date   AMPUTATION Right 07/02/2017   Procedure: RIGHT BELOW KNEE AMPUTATION;  Surgeon: Newt Minion, MD;  Location: [facility];  Service: Orthopedics;  Laterality: Right;   CARDIAC CATHETERIZATION     CATARACT EXTRACTION W/PHACO Left 11/08/2018   Procedure: CATARACT EXTRACTION PHACO AND INTRAOCULAR LENS PLACEMENT ([place])  LEFT VISION BLUE;  Surgeon: Marchia Meiers, MD;  Location: [facility];  Service: Ophthalmology;  Laterality: Left;  [place]  03:38.9 CDE 42.37 Fluid pack lot # [id number]   cyst removal from  right leg about 6 years ago     [place] Left 08/10/2017   Procedure: IRRIGATION AND DEBRIDEMENT FOOT;  Surgeon: Samara Deist, DPM;  Location: [facility];  Service: Podiatry;  Laterality: Left;   LEFT HEART CATHETERIZATION WITH CORONARY ANGIOGRAM N/A 05/11/2011   Procedure: LEFT HEART CATHETERIZATION WITH CORONARY ANGIOGRAM;  Surgeon: Troy Sine, MD;  Location: [facility] C[facility];  Service: Cardiovascular;  Laterality: N/A;   LOWER EXTREMITY ANGIOGRAPHY Left 08/13/2017   Procedure: Lower Extremity Angiography and PICC line placement;  Surgeon: Algernon Huxley, MD;  Location: [facility] CV LAB;  Service: Cardiovascular;  Laterality: Left;   ruptured navel     repair of   TOE AMPUTATION     not sure of when this surgery occured, but his heart stopped beating x 2 during surgery   Social History:  reports that he has been smoking cigarettes. He quit smokeless tobacco use about 10 years ago. He reports that he does not currently use alcohol after a past usage of about 6.0 standard drinks per week. He reports that he does not use drugs.  Allergies  Allergen Reactions   Spironolactone     Hyperkalemia (currently takes spironolactone)    Family History  Problem Relation Age of Onset   CAD Other    Diabetes Other     Prior to Admission medications   Medication Sig Start Date End Date Taking? Authorizing Provider  aspirin EC 81 MG EC tablet Take 1 tablet (81 mg total) by mouth daily. 08/14/17  Yes Fritzi Mandes, MD  atorvastatin (LIPITOR) 20 MG tablet Take 20 mg by mouth every evening.    Yes [provider]  carvedilol (COREG) 6.25 MG tablet Take 6.25 mg by mouth 2 (two) times daily with a meal.   Yes [provider]  clopidogrel (PLAVIX) 75 MG tablet Take 1 tablet (75 mg total) by mouth daily. 08/14/17  Yes Fritzi Mandes, MD  furosemide (LASIX) 20 MG tablet Take 20 mg by mouth daily.   Yes  [provider]  insulin lispro (HUMALOG) 100 UNIT/ML KwikPen Inject 5 Units into the skin 3 (three) times daily. HOLD IF CBG < or = 150 04/05/19  Yes [provider]  LANTUS SOLOSTAR 100 UNIT/ML Solostar Pen Inject 8 Units into the skin daily. 04/27/19  Yes [provider]  linagliptin (TRADJENTA) 5 MG TABS tablet Take 5 mg by mouth daily.   Yes [provider]  Magnesium 400 MG TABS Take 400 mg by mouth 2 (two) times daily.    Yes [provider]  metFORMIN (GLUCOPHAGE) 500 MG tablet Take 500 mg by mouth 2 (two) times daily with a meal.    Yes [provider]  Multiple Vitamin (MULTIVITAMIN WITH MINERALS) TABS tablet Take 1 tablet by mouth daily.   Yes [provider]  sertraline (ZOLOFT) 25 MG tablet Take 25 mg by mouth daily.  04/22/19  Yes [provider]  tamsulosin (FLOMAX) 0.4 MG CAPS capsule Take 1 capsule (0.4 mg total) by mouth daily. 07/12/17  Yes Velvet Bathe, MD  ciclopirox (LOPROX) 0.77 % cream Apply 1 application topically daily. Patient not taking: Reported on 05/08/2021 06/06/19   [provider]  Dulaglutide (TRULICITY) 1.5 0000000 SOPN Inject 1.5 mg into the skin every Friday.    [provider]  Probiotic Product (ALIGN) 4 MG CAPS Take 4 mg by mouth daily. Patient not taking: Reported on 05/08/2021    [provider]    Physical Exam: Vitals:   05/08/21 1530 05/08/21 1545 05/08/21 1559 05/08/21 1715  BP: (!) 111/56 (!) 106/59  113/65  Pulse: 84 88  75  Resp:  19  20  Temp:   98.8 F (37.1 C) 98.8 F (37.1 C)  TempSrc:   Oral Oral  SpO2: 96% 90%  95%  Weight:      Height:       General exam: Sleeping but arouses briefly, not very interactive, no acute distress, obese HEENT: moist mucus membranes, hearing grossly normal  Respiratory system: CTAB, no wheezes, rales or rhonchi, normal respiratory effort. Cardiovascular system: normal S1/S2, RRR, no peripheral edema.    Gastrointestinal system: soft, NT, ND, no HSM felt, +bowel sounds, healed umbilical scar. Central nervous system: no gross focal neurologic deficits, normal speech Extremities: Right BKA with healthy appearing stump, left great toe plantar aspect with a small draining open ulcer, left lower extremity venous stasis changes Skin: dry, intact, normal temperature, venous stasis changes of the left lower extremity, no rashes seen on visualized skin Psychiatry: normal mood, congruent affect, judgement and insight appear normal   Data Reviewed:  Labs reviewed notable for sodium 130, glucose 279, calcium 8.1 with albumin 3.1.  Lactic acid 1.8, procalcitonin 0.33.  CBC notable for leukocytosis [id number] with neutrophilic predominance, hemoglobin 11.2 .  CHEM panel notable for normal anion gap and bicarb -no acidosis despite beta hydroxybutyrate acid mildly elevated 0.63.  Negative influenza A/B and COVID-19 PCR.  Urinalysis without infection but rare bacteria.  Imaging as reviewed above  Assessment and Plan: * Sepsis ([place])- (present on admission) Present on admission with fever of 102.9 and leukocytosis 15.8.  Likely source is left great toe osteomyelitis.  Treated per sepsis protocol on admission.  Continue IV antibiotics and follow cultures.  Monitor hemodynamics closely.  Foot osteomyelitis, left ([place]) Acuity is not cleared by the x-ray. Likely needs MRI to assess acuity Continue broad-spectrum IV antibiotics given sepsis without any other source of infection. Consult vascular surgery and infectious disease tomorrow.   Chronic systolic CHF (congestive heart failure) ([place])- (present on admission) Patient appears euvolemic on admission.  Continue home Lasix and Coreg.  Monitor volume status.  PAD (peripheral artery disease) ([place])- (present on admission) With prior right BKA, followed by vascular surgery.  Now with possible left great toe osteomyelitis.  Continue aspirin and Plavix.  HTN  (hypertension), malignant- (present on admission) Blood pressure labile on admission. Continue home Coreg and Lasix.  Monitor blood pressure.  Type 2 diabetes mellitus (HCC) With hyperglycemia on admission glucose 279.  Patient had normal bicarb and normal anion gap, however beta hydroxybutyrate was checked in the ED and mildly elevated he was therefore treated with IV insulin in the ED.  Patient does not have DKA or HHS however, will resume subcutaneous insulin. -- Continue basal insulin at home dose --  Sliding scale NovoLog -- A1c pending  Pressure injury of skin- (present on admission) Present on admission. Frequently reposition patient. Local wound care and close monitoring for signs of infection.          TOBACCO DEPENDENCE- (present on admission) Counseled on importance of cessation.  Hyperlipidemia associated with type 2 diabetes mellitus ([place])- (present on admission) Continue Lipitor       Advance Care Planning:   Code Status: Full Code   Consults: None, anticipate ID and podiatry consults in the morning  Family Communication: None  Severity of Illness: The appropriate patient status for this patient is INPATIENT. Inpatient status is judged to be reasonable and necessary in order to provide the required intensity of service to ensure the patient's safety. The patient's presenting symptoms, physical exam findings, and initial radiographic and laboratory data in the context of their chronic comorbidities is felt to place them at high risk for further clinical deterioration. Furthermore, it is not anticipated that the patient will be medically stable for discharge from the hospital within 2 midnights of admission.   * I certify that at the point of admission it is my clinical judgment that the patient will require inpatient hospital care spanning beyond 2 midnights from the point of admission due to high intensity of service, high risk for further deterioration and high  frequency of surveillance required.*  Author: Ezekiel Slocumb, DO 05/08/2021 5:53 PM  For on call review [email or web address].

## 2021-05-08 NOTE — ED Triage Notes (Signed)
Pt BIB [facility] from [facility] for unwitnessed fall. Per EMS, multiple falls recently. Pt also noted to have CBG 333. Unknown if hit head. Takes Plavix based on record from [facility].

## 2021-05-08 NOTE — ED Notes (Signed)
Covid swab sent to lab.   Pt states that he will be able to urinate. Urinal given.

## 2021-05-08 NOTE — ED Notes (Signed)
This RN called lab re: covid swab sent when pt 1st arrived to ER. She states she will look for the swab & if she cannot find it, she will call this RN back.

## 2021-05-08 NOTE — Assessment & Plan Note (Addendum)
Uncontrolled with Hbg A1c 9.1%. Hyperglycemic on admission glucose 279.  Patient had normal bicarb and normal anion gap, however beta hydroxybutyrate was checked in the ED and mildly elevated he was therefore treated with IV insulin in the ED.  Patient does not have DKA or HHS however, will resume subcutaneous insulin. -- Continue basal insulin at home dose -- Sliding scale NovoLog

## 2021-05-08 NOTE — ED Notes (Signed)
Pt transported to CT via stretcher with CT tech. °

## 2021-05-08 NOTE — ED Notes (Addendum)
Pt transported to xray via stretcher with xray tech. °

## 2021-05-08 NOTE — ED Notes (Signed)
Urine sample sent to lab

## 2021-05-08 NOTE — ED Notes (Addendum)
Pt transported back to pt room from xray via stretcher with xray tech.

## 2021-05-08 NOTE — Assessment & Plan Note (Signed)
Patient appears euvolemic on admission.  Continue home Lasix and Coreg.  Monitor volume status.

## 2021-05-08 NOTE — ED Notes (Signed)
Secure msg sent to Aquilla Hacker, RN for ED to IP SBAR.

## 2021-05-08 NOTE — Assessment & Plan Note (Signed)
Present on admission. Frequently reposition patient. Local wound care and close monitoring for signs of infection.

## 2021-05-08 NOTE — Plan of Care (Signed)

## 2021-05-08 NOTE — ED Provider Notes (Signed)
[facility] Provider Note    Event Date/Time   First MD Initiated Contact with Patient 05/08/21 1220     (approximate)   History   Fall   HPI  Miguel Harvey is a 76 y.o. male  with medical history significant for HFrEF (EF recovered to 50-55%), NICM, nonobstructive CAD, PAD s/p right BKA, IDT2DM, HTN, HLD, CKD stage II, NSVT/PVCs, and tobacco use who presents to the emergency room for evaluation after a fall that occurred last night at his nursing home.  Patient states he fell out of his wheelchair but is not sure how he fell out.  He does not think he had anything but not 100% sure.  He denies any other recent falls or injuries or any acute pain today.  It seems that the staff at his nursing facility when he checked out.  He specifically denies any headache, earache, sore throat, neck pain, back pain, chest pain, abdominal pain, nausea, vomiting, diarrhea, extremity pain rash or any other clear increase in associated sick symptoms.  He denies any illicit drug use or EtOH use.  He has no other acute concerns at this time.      Physical Exam  Triage Vital Signs: ED Triage Vitals  Enc Vitals Group     BP      Pulse      Resp      Temp      Temp src      SpO2      Weight      Height      Head Circumference      Peak Flow      Pain Score      Pain Loc      Pain Edu?      Excl. in [place]?     Most recent vital signs: Vitals:   05/08/21 1330 05/08/21 1415  BP: (!) 141/71 (!) 153/75  Pulse: 89 94  Resp: (!) 23 17  Temp:    SpO2: 93% 96%    General: Awake, no distress.  CV:  Slight systolic murmur.  Regular rate and rhythm.  Slightly prolonged cap refill Resp:  Normal effort.  Bilaterally Abd:  No distention.  Throughout. Other:  Tenderness or significant edema or swelling or erythema over the extremities or patient's right BKA site or left foot.  Oropharynx is very dry there is no obvious deep space infection.    ED Results / Procedures /  Treatments  Labs (all labs ordered are listed, but only abnormal results are displayed) Labs Reviewed  CBC WITH DIFFERENTIAL/PLATELET - Abnormal; Notable for the following components:      Result Value   WBC 15.8 (*)    RBC 4.13 (*)    Hemoglobin 11.2 (*)    HCT 34.5 (*)    Neutro Abs 14.3 (*)    All other components within normal limits  COMPREHENSIVE METABOLIC PANEL - Abnormal; Notable for the following components:   Sodium 130 (*)    Glucose, Bld 279 (*)    Calcium 8.1 (*)    Albumin 3.1 (*)    All other components within normal limits  BETA-HYDROXYBUTYRIC ACID - Abnormal; Notable for the following components:   Beta-Hydroxybutyric Acid 0.63 (*)    All other components within normal limits  RESP PANEL BY RT-PCR (FLU A&B, COVID) ARPGX2  CULTURE, BLOOD (ROUTINE X 2)  CULTURE, BLOOD (ROUTINE X 2)  PROCALCITONIN  LACTIC ACID, PLASMA  URINALYSIS, COMPLETE (UACMP) WITH MICROSCOPIC  LACTIC ACID, PLASMA  PROTIME-INR  APTT     EKG  ECG is normal sinus rhythm with a ventricular rate of 91, normal axis, unremarkable intervals with PVC and no other significant evidence of acute ischemia arrhythmia   RADIOLOGY  CT head and C-spine interpreted by myself showed no evidence of intracranial hemorrhage or acute ischemia or acute C-spine injury.  X-ray reviewed radiologist interpretation and agree with the findings of some mild chronic small vessel ischemic changes and generalized tubal atrophy as well as evidence of some cervical spondylosis and spinal canal stenosis as well as some emphysema at the lung apices.  Chest x-ray interpreted by myself shows no pneumothorax, overt edema, large effusion or clear focal consolidation there is what appears to be some atelectasis at the bases.  Also reviewed radiology interpretation and agree with the findings.   PROCEDURES:  Critical Care performed: Yes, see critical care procedure note(s)  .Critical Care Performed by: Lucrezia Starch,  MD Authorized by: Lucrezia Starch, MD   Critical care provider statement:    Critical care time (minutes):  30   Critical care was necessary to treat or prevent imminent or life-threatening deterioration of the following conditions:  Sepsis   Critical care was time spent personally by me on the following activities:  Development of treatment plan with patient or surrogate, discussions with consultants, evaluation of patient's response to treatment, examination of patient, ordering and review of laboratory studies, ordering and review of radiographic studies, ordering and performing treatments and interventions, pulse oximetry, re-evaluation of patient's condition and review of old charts    MEDICATIONS ORDERED IN ED: Medications  insulin aspart (novoLOG) injection 0-15 Units (has no administration in time range)  cefTRIAXone (ROCEPHIN) 1 g in sodium chloride 0.9 % 100 mL IVPB (has no administration in time range)  lactated ringers infusion (has no administration in time range)  lactated ringers bolus 1,000 mL (1,000 mLs Intravenous New Bag/Given 05/08/21 1307)  acetaminophen (TYLENOL) tablet 1,000 mg (1,000 mg Oral Given 05/08/21 1440)     IMPRESSION / MDM / [place] / ED COURSE  I reviewed the triage vital signs and the nursing notes.                              Patient presents for evaluation after fall.  Is unclear specifically what prompted staff this morning to have patient sent to emergency room for evaluation.  He is denying any acute complaints and think he just tipped forward out of his wheelchair but not exactly sure how this happened.  He was found to be febrile on arrival at 102.9 with otherwise stable vitals although was noted to be intermittently tachypneic.  Chills for his fever include pneumonia, UTI, versus other occult sources there is no obvious evidence of a cellulitis on exam, otitis media or other deep this infection head or neck he does not have any abdominal  tenderness other GI symptoms to suggest acute abdominal infection.  He does not otherwise appear thyrotoxic.  CT head and C-spine interpreted by myself showed no evidence of intracranial hemorrhage or acute ischemia or acute C-spine injury.  X-ray reviewed radiologist interpretation and agree with the findings of some mild chronic small vessel ischemic changes and generalized tubal atrophy as well as evidence of some cervical spondylosis and spinal canal stenosis as well as some emphysema at the lung apices.  Chest x-ray interpreted by myself shows no pneumothorax, overt  edema, large effusion or clear focal consolidation there is what appears to be some atelectasis at the bases.  Also reviewed radiology interpretation and agree with the findings.  COVID influenza PCR is negative CMP is remarkable for sodium of 130, glucose of 279 without any other significant electrolyte or metabolic derangements.  CBC shows WBC count of 15.8 and hemoglobin of 11.2 compared to 10.65 months ago.  This leukocytosis and fever both SIRS criteria concerning for sepsis.  Thus I ordered blood cultures and lactic acid.  Lactic acid is 1.8.  Beta hydroxybutyrate 0.68 and overall clinical picture is not suggestive of DKA.  IV fluids started and dose of Rocephin given empirically.   Care of patient signed over to some event approximately 3:30 PM.  Plan is to obtain a UA reassess and likely admit with concern for sepsis at time of signout of unclear source.        FINAL CLINICAL IMPRESSION(S) / ED DIAGNOSES   Final diagnoses:  Fall, initial encounter  SIRS (systemic inflammatory response syndrome) ([place])     Rx / DC Orders   ED Discharge Orders     None        Note:  This document was prepared using Dragon voice recognition software and may include unintentional dictation errors.   Lucrezia Starch, MD 05/08/21 [phone]

## 2021-05-08 NOTE — Progress Notes (Signed)
CODE SEPSIS - PHARMACY COMMUNICATION  **Broad Spectrum Antibiotics should be administered within 1 hour of Sepsis diagnosis**  Time Code Sepsis Called/Page Received: 1547  Antibiotics Ordered: ceftriaxone  Time of 1st antibiotic administration: [place], PharmD, BCPS Clinical Pharmacist 05/08/2021 4:37 PM

## 2021-05-09 ENCOUNTER — Inpatient Hospital Stay: Payer: Commercial Managed Care - HMO

## 2021-05-09 DIAGNOSIS — E1169 Type 2 diabetes mellitus with other specified complication: Secondary | ICD-10-CM | POA: Diagnosis not present

## 2021-05-09 DIAGNOSIS — M86172 Other acute osteomyelitis, left ankle and foot: Secondary | ICD-10-CM

## 2021-05-09 DIAGNOSIS — E119 Type 2 diabetes mellitus without complications: Secondary | ICD-10-CM | POA: Diagnosis not present

## 2021-05-09 DIAGNOSIS — N189 Chronic kidney disease, unspecified: Secondary | ICD-10-CM

## 2021-05-09 DIAGNOSIS — A419 Sepsis, unspecified organism: Secondary | ICD-10-CM

## 2021-05-09 DIAGNOSIS — I739 Peripheral vascular disease, unspecified: Secondary | ICD-10-CM | POA: Diagnosis not present

## 2021-05-09 DIAGNOSIS — M869 Osteomyelitis, unspecified: Secondary | ICD-10-CM | POA: Diagnosis not present

## 2021-05-09 DIAGNOSIS — D638 Anemia in other chronic diseases classified elsewhere: Secondary | ICD-10-CM

## 2021-05-09 DIAGNOSIS — E1151 Type 2 diabetes mellitus with diabetic peripheral angiopathy without gangrene: Secondary | ICD-10-CM

## 2021-05-09 DIAGNOSIS — J449 Chronic obstructive pulmonary disease, unspecified: Secondary | ICD-10-CM

## 2021-05-09 DIAGNOSIS — E1122 Type 2 diabetes mellitus with diabetic chronic kidney disease: Secondary | ICD-10-CM

## 2021-05-09 LAB — GLUCOSE, CAPILLARY
Glucose-Capillary: 106 mg/dL — ABNORMAL HIGH (ref 70–99)
Glucose-Capillary: 118 mg/dL — ABNORMAL HIGH (ref 70–99)
Glucose-Capillary: 118 mg/dL — ABNORMAL HIGH (ref 70–99)
Glucose-Capillary: 140 mg/dL — ABNORMAL HIGH (ref 70–99)
Glucose-Capillary: 140 mg/dL — ABNORMAL HIGH (ref 70–99)
Glucose-Capillary: 164 mg/dL — ABNORMAL HIGH (ref 70–99)

## 2021-05-09 LAB — MRSA NEXT GEN BY PCR, NASAL: MRSA by PCR Next Gen: NOT DETECTED

## 2021-05-09 LAB — BLOOD CULTURE ID PANEL (REFLEXED) - BCID2

## 2021-05-09 LAB — BASIC METABOLIC PANEL
Anion gap: 7 (ref 5–15)
BUN: 16 mg/dL (ref 8–23)
CO2: 22 mmol/L (ref 22–32)
Calcium: 7.8 mg/dL — ABNORMAL LOW (ref 8.9–10.3)
Chloride: 104 mmol/L (ref 98–111)
Creatinine, Ser: 1.18 mg/dL (ref 0.61–1.24)
GFR, Estimated: >60 mL/min (ref >60)
Glucose, Bld: 116 mg/dL — ABNORMAL HIGH (ref 70–99)
Potassium: 3.6 mmol/L (ref 3.5–5.1)
Sodium: 133 mmol/L — ABNORMAL LOW (ref 135–145)

## 2021-05-09 LAB — CBC
HCT: 29.7 % — ABNORMAL LOW (ref 39.0–52.0)
Hemoglobin: 9.7 g/dL — ABNORMAL LOW (ref 13.0–17.0)
MCH: 27.6 pg (ref 26.0–34.0)
MCHC: 32.7 g/dL (ref 30.0–36.0)
MCV: 84.6 fL (ref 80.0–100.0)
Platelets: 239 10*3/uL (ref 150–400)
RBC: 3.51 MIL/uL — ABNORMAL LOW (ref 4.22–5.81)
RDW: 13.8 % (ref 11.5–15.5)
WBC: 11.4 10*3/uL — ABNORMAL HIGH (ref 4.0–10.5)
nRBC: 0 % (ref 0.0–0.2)

## 2021-05-09 LAB — HEMOGLOBIN A1C
Hgb A1c MFr Bld: 9.1 % — ABNORMAL HIGH (ref 4.8–5.6)
Mean Plasma Glucose: 214.47 mg/dL

## 2021-05-09 MED ORDER — SODIUM CHLORIDE 0.9 % IV SOLN
3.0000 g | Freq: Four times a day (QID) | INTRAVENOUS | Status: DC
  Administered 2021-05-09 – 2021-05-13 (×14): 3 g via INTRAVENOUS
  Filled 2021-05-09 (×3): qty 8
  Filled 2021-05-09: qty 3
  Filled 2021-05-09: qty 8
  Filled 2021-05-09 (×5): qty 3
  Filled 2021-05-09 (×2): qty 8
  Filled 2021-05-09 (×2): qty 3
  Filled 2021-05-09 (×3): qty 8

## 2021-05-09 MED ORDER — SODIUM CHLORIDE 0.9 % IV SOLN
2.0000 g | Freq: Three times a day (TID) | INTRAVENOUS | Status: DC
  Administered 2021-05-09 (×2): 2 g via INTRAVENOUS
  Filled 2021-05-09 (×5): qty 2

## 2021-05-09 NOTE — Consult Note (Signed)
NAME: Miguel Harvey  DOB: 02/23/1946  MRN: [id number]  Date/Time: 05/09/2021 1:21 PM  REQUESTING PROVIDER: Dr.Griffith Subjective:  REASON FOR CONSULT: left great toe infeciton ? Miguel Harvey is a 76 y.o. with a history of CKD< CAD, COPD, DM, HLD, RT BKA I 2019, PAD, left foot infection with osteo 5th toe in May 2019, s/p partial ray amputation of 2,3,4,5 metatarsals on 08/10/17 Presented form [facility] with generalized weakness and falls In the ED temperature 102.9, BP 144/68, heart rate 92 and sats of 98%. Labs reveal WBC of 14.8, Hb 11.2, platelet 302 and creatinine 1.23.  He was noted to have a wound on the left great toe.  X-ray of the foot showed mild age-indeterminate erosive changes of the tuft of the first distal phalanx.  Status post transmetatarsal amputation of the second through fifth digits. I am asked to see the  patient for the same PT is not aware of how long he has had the left great toe wound He is been in SNF since 2019 after his rt BKA HE is a current smoker  Past Medical History:  Diagnosis Date   Abnormal posture    CHF (congestive heart failure) (HCC)    Chronic kidney disease    CKD   stage 2   Complication of anesthesia    heart stopped x2 during toe amputation   COPD (chronic obstructive pulmonary disease) (HCC)    Coronary artery disease, non-occlusive    a. LHC 05/2011: 10-20% ostial LAD stenosis   Depression    Diabetes mellitus    HLD (hyperlipidemia)    Hypertension    Muscle weakness    Neuromuscular disorder (HCC)    neuropathy   NICM (nonischemic cardiomyopathy) ([place])    a. TTE 2/213: EF 50-55%, mild LVH, normal wall motion, Gr2DD, mildly dilated LA; b. TTE 2/13: EF 40-45%, mild concentric LVH, global HK, no evidence of LV thrombus; c. TTE 4/19: EF 40%, diffuse HK, Gr2DD, mild MR, mildly dilated LA, RV cavity size normal w/ normal RVSF, PASP 63; d. TTE 4/19: EF 25-30%, diffuse HK, mild LVH, mild MR, mild PASP; e. TTE 5/19: EF 20-25%, diffuse HK, moderate  MR,   NSVT (nonsustained ventricular tachycardia)    PAD (peripheral artery disease) ([place])    a. s/p right BKA s/p  recent balloon angioplasty fo the left peroneal artery, left posterior tibial artery, tibioperoneal trunk, and stenting to the distal left SFA in 07/2017   PEA (Pulseless electrical activity) ([place])    heart stopped beating during surgery for amputation of toes    Past Surgical History:  Procedure Laterality Date   AMPUTATION Right 07/02/2017   Procedure: RIGHT BELOW KNEE AMPUTATION;  Surgeon: Newt Minion, MD;  Location: [place];  Service: Orthopedics;  Laterality: Right;   CARDIAC CATHETERIZATION     CATARACT EXTRACTION W/PHACO Left 11/08/2018   Procedure: CATARACT EXTRACTION PHACO AND INTRAOCULAR LENS PLACEMENT ([place])  LEFT VISION BLUE;  Surgeon: Marchia Meiers, MD;  Location: [facility];  Service: Ophthalmology;  Laterality: Left;  [place] 03:38.9 CDE 42.37 Fluid pack lot # [id number]   cyst removal from  right leg about 6 years ago     [place] Left 08/10/2017   Procedure: IRRIGATION AND DEBRIDEMENT FOOT;  Surgeon: Samara Deist, DPM;  Location: [facility];  Service: Podiatry;  Laterality: Left;   LEFT HEART CATHETERIZATION WITH CORONARY ANGIOGRAM N/A 05/11/2011   Procedure: LEFT HEART CATHETERIZATION WITH CORONARY ANGIOGRAM;  Surgeon: Troy Sine, MD;  Location: [place] [facility];  Service: Cardiovascular;  Laterality: N/A;   LOWER EXTREMITY ANGIOGRAPHY Left 08/13/2017   Procedure: Lower Extremity Angiography and PICC line placement;  Surgeon: Algernon Huxley, MD;  Location: [place] [facility];  Service: Cardiovascular;  Laterality: Left;   ruptured navel     repair of   TOE AMPUTATION     not sure of when this surgery occured, but his heart stopped beating x 2 during surgery    Social History   Socioeconomic History   Marital status: Single    Spouse name: danielle .... daughter   Number of children: Not on file   Years of education: Not on file   Highest  education level: Not on file  Occupational History   Not on file  Tobacco Use   Smoking status: Every Day    Packs/day: 0.00    Years: 40.00    Pack years: 0.00    Types: Cigarettes   Smokeless tobacco: Former    Quit date: 05/07/2011   Tobacco comments:    3 cigs per day (previous hx of smoking 1 PPD)  Vaping Use   Vaping Use: Never used  Substance and Sexual Activity   Alcohol use: Not Currently    Alcohol/week: 6.0 standard drinks    Types: 6 Cans of beer per week   Drug use: Never   Sexual activity: Not on file  Other Topics Concern   Not on file  Social History Narrative   Not on file   Social Determinants of Health   Financial Resource Strain: Not on file  Food Insecurity: Not on file  Transportation Needs: Not on file  Physical Activity: Not on file  Stress: Not on file  Social Connections: Not on file  Intimate Partner Violence: Not on file    Family History  Problem Relation Age of Onset   CAD Other    Diabetes Other    Allergies  Allergen Reactions   Spironolactone     Hyperkalemia (currently takes spironolactone)   I? Current Facility-Administered Medications  Medication Dose Route Frequency Provider Last Rate Last Admin   acetaminophen (TYLENOL) tablet 650 mg  650 mg Oral Q6H PRN Nicole Kindred A, DO       Or   acetaminophen (TYLENOL) suppository 650 mg  650 mg Rectal Q6H PRN Nicole Kindred A, DO       aspirin EC tablet 81 mg  81 mg Oral Daily Nicole Kindred A, DO   81 mg at 05/09/21 [id number]   atorvastatin (LIPITOR) tablet 20 mg  20 mg Oral QPM Nicole Kindred A, DO   20 mg at 05/08/21 1843   bisacodyl (DULCOLAX) EC tablet 5 mg  5 mg Oral Daily PRN Nicole Kindred A, DO       carvedilol (COREG) tablet 6.25 mg  6.25 mg Oral BID WC Nicole Kindred A, DO   6.25 mg at 05/09/21 [id number]   ceFEPIme (MAXIPIME) 2 g in sodium chloride 0.9 % 100 mL IVPB  2 g Intravenous Q8H Virl Cagey E, RPH 200 mL/hr at 05/09/21 0827 2 g at 05/09/21 0827   clopidogrel  (PLAVIX) tablet 75 mg  75 mg Oral Daily Nicole Kindred A, DO   75 mg at 05/09/21 [id number]   enoxaparin (LOVENOX) injection 40 mg  40 mg Subcutaneous Q24H Nicole Kindred A, DO   40 mg at 05/08/21 2258   furosemide (LASIX) tablet 20 mg  20 mg Oral Daily Nicole Kindred A, DO   20 mg  at 05/09/21 0823   insulin aspart (novoLOG) injection 0-15 Units  0-15 Units Subcutaneous Q4H Lucrezia Starch, MD   2 Units at 05/09/21 0411   insulin glargine-yfgn [facility]) injection 8 Units  8 Units Subcutaneous Daily Ezekiel Slocumb, DO   8 Units at 05/08/21 2258   linagliptin (TRADJENTA) tablet 5 mg  5 mg Oral Daily Nicole Kindred A, DO   5 mg at 05/09/21 [id number]   magnesium oxide (MAG-OX) tablet 400 mg  400 mg Oral BID Renda Rolls, RPH   400 mg at 05/09/21 [id number]   multivitamin with minerals tablet 1 tablet  1 tablet Oral Daily Ezekiel Slocumb, DO   1 tablet at 05/09/21 [id number]   ondansetron (ZOFRAN) tablet 4 mg  4 mg Oral Q6H PRN Ezekiel Slocumb, DO       Or   ondansetron [facility]) injection 4 mg  4 mg Intravenous Q6H PRN Ezekiel Slocumb, DO       senna (SENOKOT) tablet 8.6 mg  1 tablet Oral BID Nicole Kindred A, DO       sertraline (ZOLOFT) tablet 25 mg  25 mg Oral Daily Nicole Kindred A, DO   25 mg at 05/09/21 [id number]   tamsulosin (FLOMAX) capsule 0.4 mg  0.4 mg Oral Daily Nicole Kindred A, DO   0.4 mg at 05/09/21 [id number]     Abtx:  Anti-infectives (From admission, onward)    Start     Dose/Rate Route Frequency Ordered Stop   05/09/21 1000  ceFEPIme (MAXIPIME) 2 g in sodium chloride 0.9 % 100 mL IVPB  Status:  Discontinued        2 g 200 mL/hr over 30 Minutes Intravenous Every 12 hours 05/08/21 2010 05/09/21 0752   05/09/21 0845  ceFEPIme (MAXIPIME) 2 g in sodium chloride 0.9 % 100 mL IVPB        2 g 200 mL/hr over 30 Minutes Intravenous Every 8 hours 05/09/21 0752     05/08/21 1800  ceFEPIme (MAXIPIME) 2 g in sodium chloride 0.9 % 100 mL IVPB  Status:  Discontinued        2 g 200 mL/hr over 30 Minutes  Intravenous Every 8 hours 05/08/21 1720 05/08/21 2010   05/08/21 1715  vancomycin (VANCOREADY) IVPB 2000 mg/400 mL        2,000 mg 200 mL/hr over 120 Minutes Intravenous  Once 05/08/21 1703 05/09/21 0134   05/08/21 1700  metroNIDAZOLE (FLAGYL) IVPB 500 mg        500 mg 100 mL/hr over 60 Minutes Intravenous  Once 05/08/21 1645 05/08/21 2200   05/08/21 1600  cefTRIAXone (ROCEPHIN) 1 g in sodium chloride 0.9 % 100 mL IVPB        1 g 200 mL/hr over 30 Minutes Intravenous  Once 05/08/21 1547 05/08/21 1825       REVIEW OF SYSTEMS:  Const: negative fever, negative chills, negative weight loss Eyes: negative diplopia or visual changes, negative eye pain ENT: negative coryza, negative sore throat Resp: negative cough, hemoptysis, dyspnea Cards: negative for chest pain, palpitations, lower extremity edema GU: negative for frequency, dysuria and hematuria GI: Negative for abdominal pain, diarrhea, bleeding, constipation Skin: negative for rash and pruritus Heme: negative for easy bruising and gum/nose bleeding MS: some weakness, falls- he thinks he could have tripped on his wheel chair Neurolo:negative for headaches, dizziness, vertigo, memory problems  Psych: negative for feelings of anxiety, depression  Endocrine:  diabetes Allergy/Immunology- as above Objective:  VITALS:  BP Marland Kitchen)  132/59 (BP Location: Right Arm)    Pulse 84    Temp 98.9 F (37.2 C)    Resp 18    Ht 6' (1.829 m)    Wt 99.8 kg    SpO2 97%    BMI 29.84 kg/m  PHYSICAL EXAM:  General: Alert, cooperative, no distress, appears stated age. Chronically ill looking Head: Normocephalic, without obvious abnormality, atraumatic. Eyes: Conjunctivae clear, anicteric sclerae. Pupils are equal ENT Nares normal. No drainage or sinus tenderness. Lips, mucosa, and tongue normal. No Thrush Neck: Supple, symmetrical, no adenopathy, thyroid: non tender no carotid bruit and no JVD. Back: No CVA tenderness. Lungs: Clear to auscultation  bilaterally. No Wheezing or Rhonchi. No rales. Heart: Regular rate and rhythm, no murmur, rub or gallop. Abdomen: Soft, non-tender,not distended. Bowel sounds normal. No masses Extremities: rt BKA Left TMA except for left great toe Wound on the tip of the great toe     Skin: No rashes or lesions. Or bruising Lymph: Cervical, supraclavicular normal. Neurologic: Grossly non-focal Pertinent Labs Lab Results CBC    Component Value Date/Time   WBC 11.4 (H) 05/09/2021 0527   RBC 3.51 (L) 05/09/2021 0527   HGB 9.7 (L) 05/09/2021 0527   HGB 11.6 (L) 11/07/2019 1439   HCT 29.7 (L) 05/09/2021 0527   HCT 35.7 (L) 11/07/2019 1439   PLT 239 05/09/2021 0527   PLT 288 11/07/2019 1439   MCV 84.6 05/09/2021 0527   MCV 89 11/07/2019 1439   MCH 27.6 05/09/2021 0527   MCHC 32.7 05/09/2021 0527   RDW 13.8 05/09/2021 0527   RDW 13.6 11/07/2019 1439   LYMPHSABS 0.9 05/08/2021 1230   LYMPHSABS 1.9 10/14/2017 0946   MONOABS 0.6 05/08/2021 1230   EOSABS 0.0 05/08/2021 1230   EOSABS 0.3 10/14/2017 0946   BASOSABS 0.0 05/08/2021 1230   BASOSABS 0.0 10/14/2017 0946    CMP Latest Ref Rng & Units 05/09/2021 05/08/2021 05/08/2021  Glucose 70 - 99 mg/dL 116(H) - 279(H)  BUN 8 - 23 mg/dL 16 - 17  Creatinine 0.61 - 1.24 mg/dL 1.18 1.31(H) 1.23  Sodium 135 - 145 mmol/L 133(L) - 130(L)  Potassium 3.5 - 5.1 mmol/L 3.6 - 4.3  Chloride 98 - 111 mmol/L 104 - 100  CO2 22 - 32 mmol/L 22 - 22  Calcium 8.9 - 10.3 mg/dL 7.8(L) - 8.1(L)  Total Protein 6.5 - 8.1 g/dL - - 7.1  Total Bilirubin 0.3 - 1.2 mg/dL - - 0.7  Alkaline Phos 38 - 126 U/L - - 54  AST 15 - 41 U/L - - 16  ALT 0 - 44 U/L - - 14      Microbiology: Recent Results (from the past 240 hour(s))  Blood Culture (routine x 2)     Status: None (Preliminary result)   Collection Time: 05/08/21 12:30 PM   Specimen: BLOOD  Result Value Ref Range Status   Specimen Description BLOOD BLOOD RIGHT FOREARM  Final   Special Requests   Final    BOTTLES  DRAWN AEROBIC AND ANAEROBIC Blood Culture adequate volume   Culture   Final    NO GROWTH < 24 HOURS Performed at [facility], [address] [place], [place] [address]    Report Status PENDING  Incomplete  Blood Culture (routine x 2)     Status: None (Preliminary result)   Collection Time: 05/08/21 12:30 PM   Specimen: BLOOD  Result Value Ref Range Status   Specimen Description BLOOD RIGHT ANTECUBITAL  Final  Special Requests   Final    BOTTLES DRAWN AEROBIC AND ANAEROBIC Blood Culture adequate volume   Culture  Setup Time   Final    GRAM POSITIVE COCCI AEROBIC BOTTLE ONLY Organism ID to follow CRITICAL RESULT CALLED TO, READ BACK BY AND VERIFIED WITH: Nancy Fetter, PHARMD AT [id number] ON 05/09/21 BY GM Performed at [facility], [address] [place], [place] [address]    Culture GRAM POSITIVE COCCI  Final   Report Status PENDING  Incomplete  Blood Culture ID Panel (Reflexed)     Status: Abnormal   Collection Time: 05/08/21 12:30 PM  Result Value Ref Range Status   Enterococcus faecalis NOT DETECTED NOT DETECTED Final   Enterococcus Faecium NOT DETECTED NOT DETECTED Final   Listeria monocytogenes NOT DETECTED NOT DETECTED Final   Staphylococcus species DETECTED (A) NOT DETECTED Final    Comment: CRITICAL RESULT CALLED TO, READ BACK BY AND VERIFIED WITH: SHEEMA HALLAJI, PHARMD AT 1122 ON 05/09/21 BY GM    Staphylococcus aureus (BCID) NOT DETECTED NOT DETECTED Final   Staphylococcus epidermidis NOT DETECTED NOT DETECTED Final   Staphylococcus lugdunensis NOT DETECTED NOT DETECTED Final   Streptococcus species NOT DETECTED NOT DETECTED Final   Streptococcus agalactiae NOT DETECTED NOT DETECTED Final   Streptococcus pneumoniae NOT DETECTED NOT DETECTED Final   Streptococcus pyogenes NOT DETECTED NOT DETECTED Final   A.calcoaceticus-baumannii NOT DETECTED NOT DETECTED Final   Bacteroides fragilis NOT DETECTED NOT DETECTED Final   Enterobacterales NOT DETECTED  NOT DETECTED Final   Enterobacter cloacae complex NOT DETECTED NOT DETECTED Final   Escherichia coli NOT DETECTED NOT DETECTED Final   Klebsiella aerogenes NOT DETECTED NOT DETECTED Final   Klebsiella oxytoca NOT DETECTED NOT DETECTED Final   Klebsiella pneumoniae NOT DETECTED NOT DETECTED Final   Proteus species NOT DETECTED NOT DETECTED Final   Salmonella species NOT DETECTED NOT DETECTED Final   Serratia marcescens NOT DETECTED NOT DETECTED Final   Haemophilus influenzae NOT DETECTED NOT DETECTED Final   Neisseria meningitidis NOT DETECTED NOT DETECTED Final   Pseudomonas aeruginosa NOT DETECTED NOT DETECTED Final   Stenotrophomonas maltophilia NOT DETECTED NOT DETECTED Final   Candida albicans NOT DETECTED NOT DETECTED Final   Candida auris NOT DETECTED NOT DETECTED Final   Candida glabrata NOT DETECTED NOT DETECTED Final   Candida krusei NOT DETECTED NOT DETECTED Final   Candida parapsilosis NOT DETECTED NOT DETECTED Final   Candida tropicalis NOT DETECTED NOT DETECTED Final   Cryptococcus neoformans/gattii NOT DETECTED NOT DETECTED Final    Comment: Performed at [facility], [address] [place], [place] [address]  Resp Panel by RT-PCR (Flu A&B, Covid) Nasopharyngeal Swab     Status: None   Collection Time: 05/08/21  2:40 PM   Specimen: Nasopharyngeal Swab; Nasopharyngeal(NP) swabs in vial transport medium  Result Value Ref Range Status   SARS Coronavirus 2 by RT PCR NEGATIVE NEGATIVE Final    Comment: (NOTE) SARS-CoV-2 target nucleic acids are NOT DETECTED.  The SARS-CoV-2 RNA is generally detectable in upper respiratory specimens during the acute phase of infection. The lowest concentration of SARS-CoV-2 viral copies this assay can detect is 138 copies/mL. A negative result does not preclude SARS-Cov-2 infection and should not be used as the sole basis for treatment or other patient management decisions. A negative result may occur with  improper specimen  collection/handling, submission of specimen other than nasopharyngeal swab, presence of viral mutation(s) within the areas targeted by this assay, and inadequate number  of viral copies(<138 copies/mL). A negative result must be combined with clinical observations, patient history, and epidemiological information. The expected result is Negative.  Fact Sheet for Patients:  EntrepreneurPulse.com.au  Fact Sheet for Healthcare Providers:  IncredibleEmployment.be  This test is no t yet approved or cleared by the [place] FDA and  has been authorized for detection and/or diagnosis of SARS-CoV-2 by FDA under an Emergency Use Authorization (EUA). This EUA will remain  in effect (meaning this test can be used) for the duration of the COVID-19 declaration under Section 564(b)(1) of the Act, 21 U.S.C.section 360bbb-3(b)(1), unless the authorization is terminated  or revoked sooner.       Influenza A by PCR NEGATIVE NEGATIVE Final   Influenza B by PCR NEGATIVE NEGATIVE Final    Comment: (NOTE) The Xpert Xpress SARS-CoV-2/FLU/RSV plus assay is intended as an aid in the diagnosis of influenza from Nasopharyngeal swab specimens and should not be used as a sole basis for treatment. Nasal washings and aspirates are unacceptable for Xpert Xpress SARS-CoV-2/FLU/RSV testing.  Fact Sheet for Patients: EntrepreneurPulse.com.au  Fact Sheet for Healthcare Providers: IncredibleEmployment.be  This test is not yet approved or cleared by the [place] FDA and has been authorized for detection and/or diagnosis of SARS-CoV-2 by FDA under an Emergency Use Authorization (EUA). This EUA will remain in effect (meaning this test can be used) for the duration of the COVID-19 declaration under Section 564(b)(1) of the Act, 21 U.S.C. section 360bbb-3(b)(1), unless the authorization is terminated or revoked.  Performed at [facility], [address] [place], [place] [address]   MRSA Next Gen by PCR, Nasal     Status: None   Collection Time: 05/09/21  3:59 AM   Specimen: Nasal Mucosa; Nasal Swab  Result Value Ref Range Status   MRSA by PCR Next Gen NOT DETECTED NOT DETECTED Final    Comment: (NOTE) The GeneXpert MRSA Assay (FDA approved for NASAL specimens only), is one component of a comprehensive MRSA colonization surveillance program. It is not intended to diagnose MRSA infection nor to guide or monitor treatment for MRSA infections. Test performance is not FDA approved in patients less than 69 years old. Performed at [facility], [address] [place],  [address]     IMAGING RESULTS:  I have personally reviewed the films ? Impression/Recommendation Pt presenting with weakness and fall Found to be febrile and left gret toe wound Xray showed osteo  Left great toe wound with osteomyelitis Possible fever is associated to that He is stable Currently on cefepime- change to Unasyn Followed by podiatrist MRI done and it showed acute osteo of the rt great toe  Anemia of chronic disease  CKD  DM- insulin, tradjenta  COPD  PAD H/o rt BKA H/o Left TMA- 2-5 ? ? ? ___________________________________________________ Discussed with patient, requesting provider ID will follow peripherally - call if needed Note:  This document was prepared using Dragon voice recognition software and may include unintentional dictation errors.

## 2021-05-09 NOTE — Plan of Care (Signed)
  Problem: Health Behavior/Discharge Planning: Goal: Ability to manage health-related needs will improve Outcome: Progressing   

## 2021-05-09 NOTE — Progress Notes (Signed)
PHARMACY - PHYSICIAN COMMUNICATION CRITICAL VALUE ALERT - BLOOD CULTURE IDENTIFICATION (BCID)  Miguel Harvey is an 76 y.o. male who presented to [facility] on 05/08/2021 with a chief complaint of fall with fever in ED.  Draining L foot ulcer  Assessment: Blood cultures from 2/9 with GPC in 1 of 4 bottles, BCID detects Staphylococcus species (Not S aureus, S epidermidis).  Patient on antibiotics for foot ulcer and possible osteomyelitis.   Name of physician (or Provider) Contacted: Dr Denton Lank  Current antibiotics: Cefepime  Changes to prescribed antibiotics recommended:  Patient is on recommended antibiotics - No changes needed.  Monitor on current antibiotic, likely contaminant  Results for orders placed or performed during the hospital encounter of 05/08/21  Blood Culture ID Panel (Reflexed) (Collected: 05/08/2021 12:30 PM)  Result Value Ref Range   Enterococcus faecalis NOT DETECTED NOT DETECTED   Enterococcus Faecium NOT DETECTED NOT DETECTED   Listeria monocytogenes NOT DETECTED NOT DETECTED   Staphylococcus species DETECTED (A) NOT DETECTED   Staphylococcus aureus (BCID) NOT DETECTED NOT DETECTED   Staphylococcus epidermidis NOT DETECTED NOT DETECTED   Staphylococcus lugdunensis NOT DETECTED NOT DETECTED   Streptococcus species NOT DETECTED NOT DETECTED   Streptococcus agalactiae NOT DETECTED NOT DETECTED   Streptococcus pneumoniae NOT DETECTED NOT DETECTED   Streptococcus pyogenes NOT DETECTED NOT DETECTED   A.calcoaceticus-baumannii NOT DETECTED NOT DETECTED   Bacteroides fragilis NOT DETECTED NOT DETECTED   Enterobacterales NOT DETECTED NOT DETECTED   Enterobacter cloacae complex NOT DETECTED NOT DETECTED   Escherichia coli NOT DETECTED NOT DETECTED   Klebsiella aerogenes NOT DETECTED NOT DETECTED   Klebsiella oxytoca NOT DETECTED NOT DETECTED   Klebsiella pneumoniae NOT DETECTED NOT DETECTED   Proteus species NOT DETECTED NOT DETECTED   Salmonella species NOT DETECTED NOT  DETECTED   Serratia marcescens NOT DETECTED NOT DETECTED   Haemophilus influenzae NOT DETECTED NOT DETECTED   Neisseria meningitidis NOT DETECTED NOT DETECTED   Pseudomonas aeruginosa NOT DETECTED NOT DETECTED   Stenotrophomonas maltophilia NOT DETECTED NOT DETECTED   Candida albicans NOT DETECTED NOT DETECTED   Candida auris NOT DETECTED NOT DETECTED   Candida glabrata NOT DETECTED NOT DETECTED   Candida krusei NOT DETECTED NOT DETECTED   Candida parapsilosis NOT DETECTED NOT DETECTED   Candida tropicalis NOT DETECTED NOT DETECTED   Cryptococcus neoformans/gattii NOT DETECTED NOT DETECTED    Juliette Alcide, PharmD, BCPS, BCIDP Work Cell: [phone] 05/09/2021 11:37 AM

## 2021-05-09 NOTE — NC FL2 (Signed)
[place] LEVEL OF CARE SCREENING TOOL     IDENTIFICATION  Patient Name: Miguel Harvey Birthdate: 08/26/1945 Sex: male Admission Date (Current Location): 05/08/2021  [facility] and [place] Number:  Engineering geologist and Address:  [facility], [address] [place], [place] [address]      Provider Number: [id number]  Attending Physician Name and Address:  Ezekiel Slocumb, DO  Relative Name and Phone Number:       Current Level of Care: Hospital Recommended Level of Care: [place] Prior Approval Number:    Date Approved/Denied:   PASRR Number: [id number]  Discharge Plan: SNF    Current Diagnoses: Patient Active Problem List   Diagnosis Date Noted   SIRS (systemic inflammatory response syndrome) ([place]) 11/17/2020   Hypertension associated with diabetes ([place]) 99991111   Chronic systolic CHF (congestive heart failure) ([place]) 11/17/2020   PAD (peripheral artery disease) ([place]) 03/25/2018   Atherosclerotic peripheral vascular disease with gangrene ([place]) 09/21/2017   Foot osteomyelitis, left ([place]) 08/05/2017   Pressure injury of skin 06/29/2017   Sepsis ([place]) 06/28/2017   Diabetic foot infection ([place]) 06/28/2017   Elevated troponin 06/28/2017   Pulmonary nodule [id number]   Acute diastolic heart failure, 2D EF 50-55% with grade 2 diastloic dysfunction 05/13/11   Family history of early CAD, brother died 68 CAD May 13, 2011   Hyperlipidemia associated with type 2 diabetes mellitus ([place]) 2011-05-13   NICM, minor CAD at cath, EF 25% at cath, 55% by 2D 13-May-2011   Type 2 diabetes mellitus ([place]) 05/07/2011   HTN (hypertension), malignant 05/07/2011   TOBACCO DEPENDENCE 05/27/2006    Orientation RESPIRATION BLADDER Height & Weight     Self, Time, Situation, Place  Normal Continent Weight: 220 lb (99.8 kg) Height:  6' (182.9 cm)  BEHAVIORAL SYMPTOMS/MOOD NEUROLOGICAL BOWEL NUTRITION STATUS   (None)  (None)  Continent Diet (Heart healthy/carb modified)  AMBULATORY STATUS COMMUNICATION OF NEEDS Skin     Verbally Other (Comment) ([place].)                       Personal Care Assistance Level of Assistance              Functional Limitations Info  Sight, Hearing, Speech Sight Info: Adequate Hearing Info: Adequate Speech Info: Adequate    SPECIAL CARE FACTORS FREQUENCY                       Contractures Contractures Info: Not present    Additional Factors Info  Code Status, Allergies Code Status Info: Full code Allergies Info: Spironolactone           Current Medications (05/09/2021):  This is the current hospital active medication list Current Facility-Administered Medications  Medication Dose Route Frequency Provider Last Rate Last Admin   acetaminophen (TYLENOL) tablet 650 mg  650 mg Oral Q6H PRN Nicole Kindred A, DO       Or   acetaminophen (TYLENOL) suppository 650 mg  650 mg Rectal Q6H PRN Nicole Kindred A, DO       aspirin EC tablet 81 mg  81 mg Oral Daily Nicole Kindred A, DO   81 mg at 05/09/21 [id number]   atorvastatin (LIPITOR) tablet 20 mg  20 mg Oral QPM Nicole Kindred A, DO   20 mg at 05/08/21 1843   bisacodyl (DULCOLAX) EC tablet 5 mg  5 mg Oral Daily PRN Ezekiel Slocumb, DO  carvedilol (COREG) tablet 6.25 mg  6.25 mg Oral BID WC Nicole Kindred A, DO   6.25 mg at 05/09/21 [id number]   ceFEPIme (MAXIPIME) 2 g in sodium chloride 0.9 % 100 mL IVPB  2 g Intravenous Q8H Dorothe Pea, RPH 200 mL/hr at 05/09/21 0827 2 g at 05/09/21 0827   clopidogrel (PLAVIX) tablet 75 mg  75 mg Oral Daily Nicole Kindred A, DO   75 mg at 05/09/21 [id number]   enoxaparin (LOVENOX) injection 40 mg  40 mg Subcutaneous Q24H Nicole Kindred A, DO   40 mg at 05/08/21 2258   furosemide (LASIX) tablet 20 mg  20 mg Oral Daily Nicole Kindred A, DO   20 mg at 05/09/21 [id number]   insulin aspart (novoLOG) injection 0-15 Units  0-15 Units Subcutaneous Q4H Lucrezia Starch, MD   2 Units at  05/09/21 0411   insulin glargine-yfgn [facility]) injection 8 Units  8 Units Subcutaneous Daily Nicole Kindred A, DO   8 Units at 05/08/21 2258   lactated ringers infusion   Intravenous Continuous Lucrezia Starch, MD 150 mL/hr at 05/09/21 0111 New Bag at 05/09/21 0111   linagliptin (TRADJENTA) tablet 5 mg  5 mg Oral Daily Nicole Kindred A, DO   5 mg at 05/09/21 [id number]   magnesium oxide (MAG-OX) tablet 400 mg  400 mg Oral BID Renda Rolls, RPH   400 mg at 05/09/21 [id number]   multivitamin with minerals tablet 1 tablet  1 tablet Oral Daily Nicole Kindred A, DO   1 tablet at 05/09/21 [id number]   ondansetron (ZOFRAN) tablet 4 mg  4 mg Oral Q6H PRN Ezekiel Slocumb, DO       Or   ondansetron [facility]) injection 4 mg  4 mg Intravenous Q6H PRN Ezekiel Slocumb, DO       senna (SENOKOT) tablet 8.6 mg  1 tablet Oral BID Nicole Kindred A, DO       sertraline (ZOLOFT) tablet 25 mg  25 mg Oral Daily Nicole Kindred A, DO   25 mg at 05/09/21 [id number]   tamsulosin (FLOMAX) capsule 0.4 mg  0.4 mg Oral Daily Nicole Kindred A, DO   0.4 mg at 05/09/21 [id number]     Discharge Medications: Please see discharge summary for a list of discharge medications.  Relevant Imaging Results:  Relevant Lab Results:   Additional Information SS#: [id number]  Candie Chroman, LCSW

## 2021-05-09 NOTE — Progress Notes (Signed)
Progress Note   Patient: Miguel Harvey [id number] DOB: 07-13-1945 DOA: 05/08/2021     1 DOS: the patient was seen and examined on 05/09/2021   Brief hospital course: Patient admitted with sepsis due to likely osteomyelitis of the left great toe after presenting with fever of 102.9 and leukocytosis.  He had no specific complaints on admission but had fallen at SNF and noted to have generalized weakness.  Podiatry and Infectious Disease are consulted.   Assessment and Plan: * Sepsis (HCC)- (present on admission) Present on admission with fever of 102.9 and leukocytosis 15.8.  Likely source is left great toe osteomyelitis.  Treated per sepsis protocol on admission.  Continue IV antibiotics and follow cultures.  Monitor hemodynamics closely.  Foot osteomyelitis, left (HCC) Acuity is not clear by the x-ray. MRI left foot is pending. Continue broad-spectrum IV antibiotics. Follow wound culture. Consults: Podiatry, Infectious Disease   Chronic systolic CHF (congestive heart failure) (HCC)- (present on admission) Patient appears euvolemic on admission.  Continue home Lasix and Coreg.  Monitor volume status.  PAD (peripheral artery disease) (HCC)- (present on admission) With prior right BKA, followed by vascular surgery.  Now with possible left great toe osteomyelitis.  Continue aspirin and Plavix.  HTN (hypertension), malignant- (present on admission) Blood pressure labile on admission. Continue home Coreg and Lasix.  Monitor blood pressure.  Type 2 diabetes mellitus (HCC) With hyperglycemia on admission glucose 279.  Patient had normal bicarb and normal anion gap, however beta hydroxybutyrate was checked in the ED and mildly elevated he was therefore treated with IV insulin in the ED.  Patient does not have DKA or HHS however, will resume subcutaneous insulin. -- Continue basal insulin at home dose -- Sliding scale NovoLog -- A1c pending  Pressure injury of skin- (present on  admission) Present on admission. Frequently reposition patient. Local wound care and close monitoring for signs of infection.          TOBACCO DEPENDENCE- (present on admission) Counseled on importance of cessation.  Hyperlipidemia associated with type 2 diabetes mellitus (HCC)- (present on admission) Continue Lipitor        Subjective: Pt seated edge of bed when seen.  Reports feeling fine.  Says if his left toe is infected, it's been that way for 63 years.  When asked about MRI to further assess for infection, he answers "whatever".  He does not offer any other acute complaints and denies fever/chills.  Physical Exam: Vitals:   05/09/21 0130 05/09/21 0405 05/09/21 0801 05/09/21 1530  BP: 135/76 124/62 (!) 132/59 135/77  Pulse: 89 92 84 85  Resp: 18 16 18 18   Temp: 98.1 F (36.7 C) 98.5 F (36.9 C) 98.9 F (37.2 C) 98.9 F (37.2 C)  TempSrc: Oral     SpO2: 100% 98% 97% 100%  Weight:      Height:       General exam: awake, alert, no acute distress HEENT: moist mucus membranes, hearing grossly normal  Respiratory system: CTAB, no wheezes, rales or rhonchi, normal respiratory effort. Cardiovascular system: normal S1/S2, RRR  Central nervous system: A&O x3. no gross focal neurologic deficits, normal speech Extremities: R BKA, LLE venous stasis, L great toe swelling with small ulceration plantar aspect currently without visible drainage, normal tone Skin: dry, intact, normal temperature Psychiatry: normal mood, congruent affect, judgement and insight appear normal   Data Reviewed:  Labs reviewed and notable for Na 133, glucose 116, WBC 11.4, Hbg 9.7  MRSA screen negative.  Family Communication:  none  Disposition: Status is: Inpatient Remains inpatient appropriate because: severity of illness on IV antibiotics with evaluation ongoing.     Planned Discharge Destination: Skilled nursing facility     Time spent: 35 minutes  Author: Pennie Banter,  DO 05/09/2021 3:41 PM  For on call review [email or web address].

## 2021-05-09 NOTE — Hospital Course (Signed)
Patient admitted with sepsis due to likely osteomyelitis of the left great toe after presenting with fever of 102.9 and leukocytosis.  He had no specific complaints on admission but had fallen at SNF and noted to have generalized weakness.  Podiatry and Infectious Disease are consulted.

## 2021-05-09 NOTE — Progress Notes (Signed)
Pharmacy Antibiotic Note  Miguel Harvey is a 76 y.o. male admitted on 05/08/2021. Pharmacy has been consulted for cefepime dosing for osteomyelitis.  2/10:  Scr 1.31 > 1.18, afebrile WBC 15.8 > 11.4  Plan: Adjust Cefepime to 2 g IV q8h based on improved renal function  Height: 6' (182.9 cm) Weight: 99.8 kg (220 lb) IBW/kg (Calculated) : 77.6  Temp (24hrs), Avg:99 F (37.2 C), Min:97.8 F (36.6 C), Max:102.9 F (39.4 C)  Recent Labs  Lab 05/08/21 1230 05/08/21 1851 05/09/21 0527  WBC 15.8*  --  11.4*  CREATININE 1.23 1.31* 1.18  LATICACIDVEN 1.8 1.6  --      Estimated Creatinine Clearance: 65.2 mL/min (by C-G formula based on SCr of 1.18 mg/dL).    Allergies  Allergen Reactions   Spironolactone     Hyperkalemia (currently takes spironolactone)    Antimicrobials this admission: Cefepime 2/9 >> Ceftriaxone 2/9 x 1 Vancomycin 2/9 x 1  Microbiology results: 2/9 BCx: pending   Thank you for allowing pharmacy to be a part of this patients care.  Dorothe Pea, PharmD, BCPS Clinical Pharmacist 05/09/2021 8:06 AM

## 2021-05-09 NOTE — Consult Note (Signed)
ORTHOPAEDIC CONSULTATION  REQUESTING PHYSICIAN: Ezekiel Slocumb, DO  Chief Complaint: Left great toe ulceration  HPI: Miguel Harvey is a 76 y.o. male who complains of draining wound to his left great toe.  Admitted and ulceration was noted.  X-rays were negative.  MRI is pending.  Upon discussion with the patient he states this has been here for an extremely long time.  He believes he has had a wound on this left great toe since he was 76 years old.  Past Medical History:  Diagnosis Date   Abnormal posture    CHF (congestive heart failure) (HCC)    Chronic kidney disease    CKD   stage 2   Complication of anesthesia    heart stopped x2 during toe amputation   COPD (chronic obstructive pulmonary disease) (HCC)    Coronary artery disease, non-occlusive    a. LHC 05/2011: 10-20% ostial LAD stenosis   Depression    Diabetes mellitus    HLD (hyperlipidemia)    Hypertension    Muscle weakness    Neuromuscular disorder (HCC)    neuropathy   NICM (nonischemic cardiomyopathy) ([place])    a. TTE 2/213: EF 50-55%, mild LVH, normal wall motion, Gr2DD, mildly dilated LA; b. TTE 2/13: EF 40-45%, mild concentric LVH, global HK, no evidence of LV thrombus; c. TTE 4/19: EF 40%, diffuse HK, Gr2DD, mild MR, mildly dilated LA, RV cavity size normal w/ normal RVSF, PASP 63; d. TTE 4/19: EF 25-30%, diffuse HK, mild LVH, mild MR, mild PASP; e. TTE 5/19: EF 20-25%, diffuse HK, moderate MR,   NSVT (nonsustained ventricular tachycardia)    PAD (peripheral artery disease) ([place])    a. s/p right BKA s/p  recent balloon angioplasty fo the left peroneal artery, left posterior tibial artery, tibioperoneal trunk, and stenting to the distal left SFA in 07/2017   PEA (Pulseless electrical activity) ([place])    heart stopped beating during surgery for amputation of toes   Past Surgical History:  Procedure Laterality Date   AMPUTATION Right 07/02/2017   Procedure: RIGHT BELOW KNEE AMPUTATION;  Surgeon: Newt Minion,  MD;  Location: [place];  Service: Orthopedics;  Laterality: Right;   CARDIAC CATHETERIZATION     CATARACT EXTRACTION W/PHACO Left 11/08/2018   Procedure: CATARACT EXTRACTION PHACO AND INTRAOCULAR LENS PLACEMENT ([place])  LEFT VISION BLUE;  Surgeon: Marchia Meiers, MD;  Location: [facility];  Service: Ophthalmology;  Laterality: Left;  [place] 03:38.9 CDE 42.37 Fluid pack lot # [id number]   cyst removal from  right leg about 6 years ago     [place] Left 08/10/2017   Procedure: IRRIGATION AND DEBRIDEMENT FOOT;  Surgeon: Samara Deist, DPM;  Location: [facility];  Service: Podiatry;  Laterality: Left;   LEFT HEART CATHETERIZATION WITH CORONARY ANGIOGRAM N/A 05/11/2011   Procedure: LEFT HEART CATHETERIZATION WITH CORONARY ANGIOGRAM;  Surgeon: Troy Sine, MD;  Location[facility];  Service: Cardiovascular;  Laterality: N/A;   LOWER EXTREMITY ANGIOGRAPHY Left 08/13/2017   Procedure: Lower Extremity Angiography and PICC line placement;  Surgeon: Algernon Huxley, MD;  Location: [place] CV LAB;  Service: Cardiovascular;  Laterality: Left;   ruptured navel     repair of   TOE AMPUTATION     not sure of when this surgery occured, but his heart stopped beating x 2 during surgery   Social History   Socioeconomic History   Marital status: Single    Spouse name: danielle .... daughter  Number of children: Not on file   Years of education: Not on file   Highest education level: Not on file  Occupational History   Not on file  Tobacco Use   Smoking status: Every Day    Packs/day: 0.00    Years: 40.00    Pack years: 0.00    Types: Cigarettes   Smokeless tobacco: Former    Quit date: 05/07/2011   Tobacco comments:    3 cigs per day (previous hx of smoking 1 PPD)  Vaping Use   Vaping Use: Never used  Substance and Sexual Activity   Alcohol use: Not Currently    Alcohol/week: 6.0 standard drinks    Types: 6 Cans of beer per week   Drug use: Never   Sexual activity: Not on file   Other Topics Concern   Not on file  Social History Narrative   Not on file   Social Determinants of Health   Financial Resource Strain: Not on file  Food Insecurity: Not on file  Transportation Needs: Not on file  Physical Activity: Not on file  Stress: Not on file  Social Connections: Not on file   Family History  Problem Relation Age of Onset   CAD Other    Diabetes Other    Allergies  Allergen Reactions   Spironolactone     Hyperkalemia (currently takes spironolactone)   Prior to Admission medications   Medication Sig Start Date End Date Taking? Authorizing Provider  aspirin EC 81 MG EC tablet Take 1 tablet (81 mg total) by mouth daily. 08/14/17  Yes Fritzi Mandes, MD  atorvastatin (LIPITOR) 20 MG tablet Take 20 mg by mouth every evening.    Yes [provider]  carvedilol (COREG) 6.25 MG tablet Take 6.25 mg by mouth 2 (two) times daily with a meal.   Yes [provider]  clopidogrel (PLAVIX) 75 MG tablet Take 1 tablet (75 mg total) by mouth daily. 08/14/17  Yes Fritzi Mandes, MD  furosemide (LASIX) 20 MG tablet Take 20 mg by mouth daily.   Yes [provider]  insulin lispro (HUMALOG) 100 UNIT/ML KwikPen Inject 5 Units into the skin 3 (three) times daily. HOLD IF CBG < or = 150 04/05/19  Yes [provider]  LANTUS SOLOSTAR 100 UNIT/ML Solostar Pen Inject 8 Units into the skin daily. 04/27/19  Yes [provider]  linagliptin (TRADJENTA) 5 MG TABS tablet Take 5 mg by mouth daily.   Yes [provider]  Magnesium 400 MG TABS Take 400 mg by mouth 2 (two) times daily.    Yes [provider]  metFORMIN (GLUCOPHAGE) 500 MG tablet Take 500 mg by mouth 2 (two) times daily with a meal.    Yes [provider]  Multiple Vitamin (MULTIVITAMIN WITH MINERALS) TABS tablet Take 1 tablet by mouth daily.   Yes [provider]  sertraline (ZOLOFT) 25 MG tablet Take 25 mg by mouth daily.  04/22/19  Yes [provider]  tamsulosin (FLOMAX) 0.4 MG CAPS capsule Take 1 capsule (0.4 mg total) by mouth daily. 07/12/17  Yes Velvet Bathe, MD  ciclopirox (LOPROX) 0.77 % cream Apply 1 application topically daily. Patient not taking: Reported on 05/08/2021 06/06/19   [provider]  Dulaglutide (TRULICITY) 1.5 0000000 SOPN Inject 1.5 mg into the skin every Friday.    [provider]  Probiotic Product (ALIGN) 4 MG CAPS Take 4 mg by mouth daily. Patient not taking: Reported on 05/08/2021  [provider]   DG Chest 2 View  Result Date: 05/08/2021 CLINICAL DATA:  Fever EXAM: CHEST - 2 VIEW COMPARISON:  Chest two views 11/17/2020 FINDINGS: Cardiac silhouette and mediastinal contours are within normal limits. Mild bibasilar bronchovascular crowding. Mildly decreased lung volumes. No focal airspace opacity to indicate pneumonia. No pleural effusion or pneumothorax. Moderate multilevel degenerative disc changes of the thoracic spine. IMPRESSION: Mildly decreased lung volumes with mild subsegmental atelectasis. No definite radiographic evidence of pneumonia. Electronically Signed   By: Yvonne Kendall M.D.   On: 05/08/2021 13:11   CT HEAD WO CONTRAST (5MM)  Result Date: 05/08/2021 CLINICAL DATA:  Provided history: Head trauma, minor. Neck trauma. Additional history provided by scanning technologist: Multiple falls. Patient takes Plavix. EXAM: CT HEAD WITHOUT CONTRAST CT CERVICAL SPINE WITHOUT CONTRAST TECHNIQUE: Multidetector CT imaging of the head and cervical spine was performed following the standard protocol without intravenous contrast. Multiplanar CT image reconstructions of the cervical spine were also generated. RADIATION DOSE REDUCTION: This exam was performed according to the departmental dose-optimization program which includes automated exposure control, adjustment of the mA and/or kV according to patient size and/or use of iterative reconstruction technique. COMPARISON:  Prior head CT  examinations 08/06/2017 and earlier. Brain MRI 07/02/2017. FINDINGS: CT HEAD FINDINGS Brain: Mild generalized cerebral atrophy. Mild patchy and ill-defined hypoattenuation within the cerebral white matter, nonspecific but compatible chronic small vessel ischemic disease. There is no acute intracranial hemorrhage. No demarcated cortical infarct. No extra-axial fluid collection. No evidence of an intracranial mass. No midline shift. Vascular: No hyperdense vessel.  Atherosclerotic calcifications. Skull: Normal. Negative for fracture or focal lesion. Sinuses/Orbits: Visualized orbits show no acute finding. Mild mucosal thickening within the bilateral ethmoid sinuses. CT CERVICAL SPINE FINDINGS Alignment: Reversal of the expected cervical lordosis. No significant spondylolisthesis. Skull base and vertebrae: The basion-dental and atlanto-dental intervals are maintained.No evidence of acute fracture to the cervical spine. Soft tissues and spinal canal: No prevertebral fluid or swelling. No visible canal hematoma. Disc levels: Cervical spondylosis with multilevel disc space narrowing, disc bulges/central disc protrusions, posterior disc osteophytes and uncovertebral hypertrophy. Multilevel spinal canal stenosis. Most notably, there is apparent at least moderate spinal canal stenosis at C3-C4, C4-C5, C5-C6 and C6-C7. Multilevel bony neural foraminal narrowing. Multilevel ventral osteophytes, most prominent at C3-C4. Upper chest: No consolidation within the imaged lung apices. No visible pneumothorax. Centrilobular emphysema. IMPRESSION: CT head: 1. No evidence of acute intracranial abnormality. 2. Mild chronic small vessel ischemic changes within the cerebral white matter. 3. Mild generalized cerebral atrophy. CT cervical spine: 1. No evidence of acute fracture to the cervical spine. 2. Nonspecific reversal of the expected cervical lordosis. 3. Cervical spondylosis, as outlined. Multilevel spinal canal stenosis. Most  notably, there is suspected at least moderate spinal canal stenosis at C3-C4, C4-C5, C5-C6 and C6-C7. Multilevel bony neural foraminal narrowing. 4.  Emphysema (ICD10-J43.9). Electronically Signed   By: Kellie Simmering D.O.   On: 05/08/2021 14:22   CT Cervical Spine Wo Contrast  Result Date: 05/08/2021 CLINICAL DATA:  Provided history: Head trauma, minor. Neck trauma. Additional history provided by scanning technologist: Multiple falls. Patient takes Plavix. EXAM: CT HEAD WITHOUT CONTRAST CT CERVICAL SPINE WITHOUT CONTRAST TECHNIQUE: Multidetector CT imaging of the head and cervical spine was performed following the standard protocol without intravenous contrast. Multiplanar CT image reconstructions of the cervical spine were also generated. RADIATION DOSE REDUCTION: This exam was performed according to the departmental dose-optimization program which includes automated exposure control, adjustment of the mA  and/or kV according to patient size and/or use of iterative reconstruction technique. COMPARISON:  Prior head CT examinations 08/06/2017 and earlier. Brain MRI 07/02/2017. FINDINGS: CT HEAD FINDINGS Brain: Mild generalized cerebral atrophy. Mild patchy and ill-defined hypoattenuation within the cerebral white matter, nonspecific but compatible chronic small vessel ischemic disease. There is no acute intracranial hemorrhage. No demarcated cortical infarct. No extra-axial fluid collection. No evidence of an intracranial mass. No midline shift. Vascular: No hyperdense vessel.  Atherosclerotic calcifications. Skull: Normal. Negative for fracture or focal lesion. Sinuses/Orbits: Visualized orbits show no acute finding. Mild mucosal thickening within the bilateral ethmoid sinuses. CT CERVICAL SPINE FINDINGS Alignment: Reversal of the expected cervical lordosis. No significant spondylolisthesis. Skull base and vertebrae: The basion-dental and atlanto-dental intervals are maintained.No evidence of acute fracture to the  cervical spine. Soft tissues and spinal canal: No prevertebral fluid or swelling. No visible canal hematoma. Disc levels: Cervical spondylosis with multilevel disc space narrowing, disc bulges/central disc protrusions, posterior disc osteophytes and uncovertebral hypertrophy. Multilevel spinal canal stenosis. Most notably, there is apparent at least moderate spinal canal stenosis at C3-C4, C4-C5, C5-C6 and C6-C7. Multilevel bony neural foraminal narrowing. Multilevel ventral osteophytes, most prominent at C3-C4. Upper chest: No consolidation within the imaged lung apices. No visible pneumothorax. Centrilobular emphysema. IMPRESSION: CT head: 1. No evidence of acute intracranial abnormality. 2. Mild chronic small vessel ischemic changes within the cerebral white matter. 3. Mild generalized cerebral atrophy. CT cervical spine: 1. No evidence of acute fracture to the cervical spine. 2. Nonspecific reversal of the expected cervical lordosis. 3. Cervical spondylosis, as outlined. Multilevel spinal canal stenosis. Most notably, there is suspected at least moderate spinal canal stenosis at C3-C4, C4-C5, C5-C6 and C6-C7. Multilevel bony neural foraminal narrowing. 4.  Emphysema (ICD10-J43.9). Electronically Signed   By: Jackey Loge D.O.   On: 05/08/2021 14:22   DG Foot Complete Left  Result Date: 05/08/2021 CLINICAL DATA:  Sepsis EXAM: LEFT FOOT - COMPLETE 3+ VIEW COMPARISON:  08/05/2017 FINDINGS: Patient is status post transmetatarsal amputation of the second through fifth digits. Mild degenerative change at the first MTP joint. Mild erosive change at the tuft of first distal phalanx with some changes present on the 2019 comparison study but potentially increased on today's exam. No soft tissue emphysema IMPRESSION: 1. Mild age indeterminate erosive changes at the tuft of the first distal phalanx, correlate for signs of active infection here 2. Status post trans metatarsal amputation of the second through fourth  digits. Electronically Signed   By: Jasmine Pang M.D.   On: 05/08/2021 17:05    Positive ROS: All other systems have been reviewed and were otherwise negative with the exception of those mentioned in the HPI and as above.  12 point ROS was performed.  Physical Exam: General: Alert and oriented.  No apparent distress.  Vascular:  Left foot:Dorsalis Pedis:  thready Posterior Tibial:  thready  Right foot:  Status post BKA  He has had ABIs performed in the outpatient clinic.  This showed normal ABIs with normal toe pressures  Neuro:absent protective sensation  Derm: On the very distal aspect of the left great toe there is an ulceration.  There was a little bit of fibrotic superficial tissue.  I probed the area and it does probe down to bone.  There is scant amount of purulence.  I did take a wound culture today.  Ortho/MS: He status post right side BKA.  He is also undergone previous amputations of toes 2 through 5.  The only  remaining toe is his left great toe.  Assessment: Possible osteomyelitis distal left great toe Referral vascular disease  Plan: Awaiting MRI.  A culture was performed today we will send this off for sensitivities.  I discussed with the patient is a very strong likelihood that there is infection into the bone on the very distal aspect of the great toe.  I discussed this will likely not clear with just antibiotics and would need to be removed similar to previous surgeries that is gone through for amputations.  The patient seem resistant to any consideration for surgery.  He states the wound has been present since he was 76 years old and is not this time willing to undergo any type of debridement.  If MRI is consistent with osteomyelitis he will need long-term IV antibiotics.  If he chooses to allow [place] to undergo debridement would likely perform digital amputation to remove all of the infection and this would likely remove the pressure from the end of the toe and we could  remove the toe back to the level of the previous amputations for an easier contour to prevent future ulceration.    Elesa Hacker, DPM Cell [phone]   05/09/2021 12:00 PM

## 2021-05-09 NOTE — TOC Initial Note (Signed)
Transition of Care [facility]) - Initial/Assessment Note    Patient Details  Name: Miguel Harvey MRN: [id number] Date of Birth: 1945/12/06  [facility]) CM/SW Contact:    Candie Chroman, LCSW Phone Number: 05/09/2021, 10:23 AM  Clinical Narrative:  CSW met with patient. No supports at bedside. CSW introduced role and explained that discharge planning would be discussed. Patient confirmed he is a long-term resident at [facility]. No further concerns. CSW encouraged patient to contact CSW as needed. CSW will continue to follow patient for support and facilitate return to SNF once medically stable.                Expected Discharge Plan: Skilled Nursing Facility Barriers to Discharge: Continued Medical Work up   Patient Goals and CMS Choice     Choice offered to / list presented to : NA  Expected Discharge Plan and Services Expected Discharge Plan: [place] Acute Care Choice: Resumption of Svcs/PTA Provider Living arrangements for the past 2 months: [place]                                      Prior Living Arrangements/Services Living arrangements for the past 2 months: [place] Lives with:: Facility Resident Patient language and need for interpreter reviewed:: Yes Do you feel safe going back to the place where you live?: Yes      Need for Family Participation in Patient Care: Yes (Comment) Care giver support system in place?: Yes (comment)   Criminal Activity/Legal Involvement Pertinent to Current Situation/Hospitalization: No - Comment as needed  Activities of Daily Living Home Assistive Devices/Equipment: Cane (specify quad or straight), Wheelchair, Environmental consultant (specify type) ADL Screening (condition at time of admission) Patient's cognitive ability adequate to safely complete daily activities?: Yes Is the patient deaf or have difficulty hearing?: No Does the patient have difficulty  seeing, even when wearing glasses/contacts?: No Does the patient have difficulty concentrating, remembering, or making decisions?: No Patient able to express need for assistance with ADLs?: No Does the patient have difficulty dressing or bathing?: Yes Independently performs ADLs?: Yes (appropriate for developmental age) Communication: Independent Dressing (OT): Independent Grooming: Independent Feeding: Independent Bathing: Independent Toileting: Needs assistance Is this a change from baseline?: Pre-admission baseline Does the patient have difficulty walking or climbing stairs?: Yes Weakness of Legs: Both Weakness of Arms/Hands: Both  Permission Sought/Granted Permission sought to share information with : Facility Art therapist granted to share information with : Yes, Verbal Permission Granted     Permission granted to share info w AGENCY: [facility]        Emotional Assessment Appearance:: Appears stated age Attitude/Demeanor/Rapport: Engaged, Gracious Affect (typically observed): Accepting, Appropriate, Calm, Pleasant Orientation: : Oriented to Self, Oriented to Place, Oriented to  Time, Oriented to Situation Alcohol / Substance Use: Not Applicable Psych Involvement: No (comment)  Admission diagnosis:  SIRS (systemic inflammatory response syndrome) ([place]) [R65.10] Fall, initial encounter [W19.XXXA] Sepsis [facility]) [A41.9] Patient Active Problem List   Diagnosis Date Noted   SIRS (systemic inflammatory response syndrome) ([place]) 11/17/2020   Hypertension associated with diabetes ([place]) 30/94/0768   Chronic systolic CHF (congestive heart failure) ([place]) 11/17/2020   PAD (peripheral artery disease) ([place]) 03/25/2018   Atherosclerotic peripheral vascular disease with gangrene ([place]) 09/21/2017   Foot osteomyelitis, left ([place]) 08/05/2017   Pressure injury  of skin 06/29/2017   Sepsis ([place]) 06/28/2017   Diabetic foot infection ([place]) 06/28/2017    Elevated troponin 06/28/2017   Pulmonary nodule 97/94/9971   Acute diastolic heart failure, 2D EF 50-55% with grade 2 diastloic dysfunction 05-18-2011   Family history of early CAD, brother died 62 CAD 05-18-11   Hyperlipidemia associated with type 2 diabetes mellitus ([place]) 05/18/2011   NICM, minor CAD at cath, EF 25% at cath, 55% by 2D 05-18-2011   Type 2 diabetes mellitus ([place]) 05/07/2011   HTN (hypertension), malignant 05/07/2011   TOBACCO DEPENDENCE 05/27/2006   PCP:  Garwin Brothers, MD Pharmacy:  No Pharmacies Listed    Social Determinants of Health (SDOH) Interventions    Readmission Risk Interventions No flowsheet data found.

## 2021-05-10 DIAGNOSIS — M869 Osteomyelitis, unspecified: Secondary | ICD-10-CM | POA: Diagnosis not present

## 2021-05-10 DIAGNOSIS — A419 Sepsis, unspecified organism: Principal | ICD-10-CM

## 2021-05-10 LAB — GLUCOSE, CAPILLARY
Glucose-Capillary: 155 mg/dL — ABNORMAL HIGH (ref 70–99)
Glucose-Capillary: 173 mg/dL — ABNORMAL HIGH (ref 70–99)
Glucose-Capillary: 176 mg/dL — ABNORMAL HIGH (ref 70–99)
Glucose-Capillary: 179 mg/dL — ABNORMAL HIGH (ref 70–99)
Glucose-Capillary: 249 mg/dL — ABNORMAL HIGH (ref 70–99)

## 2021-05-10 LAB — BASIC METABOLIC PANEL
Anion gap: 9 (ref 5–15)
BUN: 15 mg/dL (ref 8–23)
CO2: 22 mmol/L (ref 22–32)
Calcium: 8.2 mg/dL — ABNORMAL LOW (ref 8.9–10.3)
Chloride: 104 mmol/L (ref 98–111)
Creatinine, Ser: 1.09 mg/dL (ref 0.61–1.24)
GFR, Estimated: >60 mL/min (ref >60)
Glucose, Bld: 141 mg/dL — ABNORMAL HIGH (ref 70–99)
Potassium: 3.7 mmol/L (ref 3.5–5.1)
Sodium: 135 mmol/L (ref 135–145)

## 2021-05-10 LAB — CBC
HCT: 30.9 % — ABNORMAL LOW (ref 39.0–52.0)
Hemoglobin: 9.9 g/dL — ABNORMAL LOW (ref 13.0–17.0)
MCH: 27 pg (ref 26.0–34.0)
MCHC: 32 g/dL (ref 30.0–36.0)
MCV: 84.2 fL (ref 80.0–100.0)
Platelets: 261 10*3/uL (ref 150–400)
RBC: 3.67 MIL/uL — ABNORMAL LOW (ref 4.22–5.81)
RDW: 13.7 % (ref 11.5–15.5)
WBC: 10.5 10*3/uL (ref 4.0–10.5)
nRBC: 0 % (ref 0.0–0.2)

## 2021-05-10 MED ORDER — CHLORHEXIDINE GLUCONATE 4 % EX LIQD: 60.0000 mL | Freq: Once | CUTANEOUS | Status: AC

## 2021-05-10 MED ORDER — POVIDONE-IODINE 10 % EX SWAB: 2.0000 "application " | Freq: Once | CUTANEOUS | Status: DC

## 2021-05-10 NOTE — Progress Notes (Signed)
Progress Note   Patient: Miguel Harvey XNT:[id number] DOB: [birth year] DOA: 05/08/2021     2 DOS: the patient was seen and examined on 05/10/2021   Brief hospital course: Patient admitted with sepsis due to likely osteomyelitis of the left great toe after presenting with fever of 102.9 and leukocytosis.  He had no specific complaints on admission but had fallen at SNF and noted to have generalized weakness.  Podiatry and Infectious Disease are consulted.   Assessment and Plan: * Sepsis (HCC)- (present on admission) Present on admission with fever of 102.9 and leukocytosis 15.8.  Likely source is left great toe osteomyelitis.  Treated per sepsis protocol on admission.  Continue IV antibiotics and follow cultures.  Monitor hemodynamics closely.  Foot osteomyelitis, left (HCC)- (present on admission) Acuity is not clear by the x-ray. MRI left foot is pending. Initially on broad-spectrum IV antibiotics. On Unasyn per ID. Follow wound culture. Pt adamantly declines any surgical debridement or amputation. Consults: Podiatry, Infectious Disease   Chronic systolic CHF (congestive heart failure) (HCC)- (present on admission) Patient appears euvolemic on admission.  Continue home Lasix and Coreg.  Monitor volume status.  PAD (peripheral artery disease) (HCC)- (present on admission) With prior right BKA, followed by vascular surgery.  Now with possible left great toe osteomyelitis.  Continue aspirin and Plavix.  HTN (hypertension), malignant- (present on admission) Blood pressure labile on admission. Continue home Coreg and Lasix.  Monitor blood pressure.  Type 2 diabetes mellitus (HCC) Uncontrolled with Hbg A1c 9.1%. Hyperglycemic on admission glucose 279.  Patient had normal bicarb and normal anion gap, however beta hydroxybutyrate was checked in the ED and mildly elevated he was therefore treated with IV insulin in the ED.  Patient does not have DKA or HHS however, will resume subcutaneous  insulin. -- Continue basal insulin at home dose -- Sliding scale NovoLog -- A1c pending  Pressure injury of skin- (present on admission) Present on admission. Frequently reposition patient. Local wound care and close monitoring for signs of infection.          TOBACCO DEPENDENCE- (present on admission) Counseled on importance of cessation.  Hyperlipidemia associated with type 2 diabetes mellitus (HCC)- (present on admission) Continue Lipitor        Subjective: Pt awake resting in bed.  Says he feels fine. No fevers chills or other complaints.  He adamantly declines any surgery for amputation or debridement of the bone infection in his toe.  Physical Exam: Vitals:   05/09/21 1530 05/09/21 2343 05/10/21 0540 05/10/21 0807  BP: 135/77 127/71 137/73 130/67  Pulse: 85 74 69   Resp: 18 16 18 18   Temp: 98.9 F (37.2 C) 99.1 F (37.3 C) 98.4 F (36.9 C) 98.6 F (37 C)  TempSrc:  Oral Oral Oral  SpO2: 100% 100% 100% 100%  Weight:      Height:       General exam: awake, alert, no acute distress HEENT: atraumatic, clear conjunctiva, anicteric sclera, oist mucus membranes, hearing grossly normal  Respiratory system: CTAB, no wheezes, rales or rhonchi, normal respiratory effort. Cardiovascular system: normal S1/S2, RRR,  no pedal edema.   Gastrointestinal system: soft, NT, ND, no HSM felt, +bowel sounds. Central nervous system: A&O x3. no gross focal neurologic deficits, normal speech Extremities: R BKA, LLE venous stasis, swollen L great toe with ulceration not currently draining Skin: dry, intact, normal temperature Psychiatry: normal mood, congruent affect, judgement and insight appear normal   Data Reviewed:  Labs reviewed and notable for  glucose 141, Hbg stable 9.9,  Wound culture preliminary showing RARE WBC PRESENT,BOTH PMN AND MONONUCLEAR   Family Communication: none  Disposition: Status is: Inpatient Remains inpatient appropriate because: requiring IV  antibiotics, cultures pending.  Anticipate DC to SNF on Monday after cultures back and PICC line placed.          Planned Discharge Destination: Skilled nursing facility     Time spent: 35 minutes  Author: Pennie Banter, DO 05/10/2021 11:50 AM  For on call review [email or web address].

## 2021-05-10 NOTE — Progress Notes (Addendum)
Daily Progress Note   Subjective  - * No surgery found *  F/u left great toe ulcer.    Objective Vitals:   05/09/21 1530 05/09/21 2343 05/10/21 0540 05/10/21 0807  BP: 135/77 127/71 137/73 130/67  Pulse: 85 74 69   Resp: 18 16 18 18   Temp: 98.9 F (37.2 C) 99.1 F (37.3 C) 98.4 F (36.9 C) 98.6 F (37 C)  TempSrc:  Oral Oral Oral  SpO2: 100% 100% 100% 100%  Weight:      Height:        Physical Exam: MRI confirms osteo to distal phalanx.   IMPRESSION: 1. Acute osteomyelitis of the left great toe distal phalanx. 2. 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 No abnormal signal changes at the resection margins. 3. Chronic denervation changes with possible myositis of the foot musculature. Laboratory CBC    Component Value Date/Time   WBC 10.5 05/10/2021 0322   HGB 9.9 (L) 05/10/2021 0322   HGB 11.6 (L) 11/07/2019 1439   HCT 30.9 (L) 05/10/2021 0322   HCT 35.7 (L) 11/07/2019 1439   PLT 261 05/10/2021 0322   PLT 288 11/07/2019 1439    BMET    Component Value Date/Time   NA 135 05/10/2021 0322   NA 141 11/07/2019 1439   K 3.7 05/10/2021 0322   CL 104 05/10/2021 0322   CO2 22 05/10/2021 0322   GLUCOSE 141 (H) 05/10/2021 0322   BUN 15 05/10/2021 0322   BUN 14 11/07/2019 1439   CREATININE 1.09 05/10/2021 0322   CREATININE 1.43 (H) 09/21/2012 1150   CALCIUM 8.2 (L) 05/10/2021 0322   GFRNONAA >60 05/10/2021 0322   GFRAA 73 11/07/2019 1439    Assessment/Planning: Osteomyelitis left great toe  D/W recommendation for amputation of at least distal phalanx great toe.  Pt expressed resistance but will d/w family and let me know. C/W IV abx per ID. Outpt abi's showed arterial flow to foot adequate for healing. Will await pts decision and he stated he will let me know today. Will hold spot in OR awaiting final decision.  Samara Deist A  05/10/2021, 10:49 AM  Addendum:  Spoke to pts daughter (POA) and has given  consent for surgery for amputation.

## 2021-05-11 ENCOUNTER — Inpatient Hospital Stay: Payer: Commercial Managed Care - HMO | Admitting: Anesthesiology

## 2021-05-11 ENCOUNTER — Encounter
Admission: EM | Disposition: A | Discharge status: Skilled Nursing Facility | Payer: Self-pay | Source: Skilled Nursing Facility | Attending: Internal Medicine

## 2021-05-11 ENCOUNTER — Other Ambulatory Visit: Payer: Self-pay

## 2021-05-11 DIAGNOSIS — A419 Sepsis, unspecified organism: Secondary | ICD-10-CM | POA: Diagnosis not present

## 2021-05-11 DIAGNOSIS — M869 Osteomyelitis, unspecified: Secondary | ICD-10-CM | POA: Diagnosis not present

## 2021-05-11 HISTORY — PX: AMPUTATION TOE: SHX6595

## 2021-05-11 LAB — GLUCOSE, CAPILLARY
Glucose-Capillary: 209 mg/dL — ABNORMAL HIGH (ref 70–99)
Glucose-Capillary: 141 mg/dL — ABNORMAL HIGH (ref 70–99)
Glucose-Capillary: 156 mg/dL — ABNORMAL HIGH (ref 70–99)
Glucose-Capillary: 173 mg/dL — ABNORMAL HIGH (ref 70–99)
Glucose-Capillary: 156 mg/dL — ABNORMAL HIGH (ref 70–99)
Glucose-Capillary: 134 mg/dL — ABNORMAL HIGH (ref 70–99)

## 2021-05-11 LAB — SURGICAL PCR SCREEN
MRSA, PCR: NEGATIVE
Staphylococcus aureus: POSITIVE — AB

## 2021-05-11 SURGERY — AMPUTATION, TOE
Anesthesia: Monitor Anesthesia Care | Site: Toe | Laterality: Left

## 2021-05-11 MED ORDER — ONDANSETRON HCL 4 MG/2ML IJ SOLN: 4.0000 mg | Freq: Once | INTRAMUSCULAR | Status: DC | PRN

## 2021-05-11 MED ORDER — MIDAZOLAM HCL 2 MG/2ML IJ SOLN
INTRAMUSCULAR | Status: DC | PRN
  Administered 2021-05-11 (×2): 1 mg via INTRAVENOUS

## 2021-05-11 MED ORDER — LACTATED RINGERS IV SOLN: INTRAVENOUS | Status: DC | PRN

## 2021-05-11 MED ORDER — 0.9 % SODIUM CHLORIDE (POUR BTL) OPTIME
TOPICAL | Status: DC | PRN
  Administered 2021-05-11: 1000 mL

## 2021-05-11 MED ORDER — MIDAZOLAM HCL 2 MG/2ML IJ SOLN
INTRAMUSCULAR | Status: AC
  Filled 2021-05-11: qty 2

## 2021-05-11 MED ORDER — OXYCODONE HCL 5 MG PO TABS
5.0000 mg | ORAL_TABLET | Freq: Once | ORAL | Status: AC
  Administered 2021-05-11: 5 mg via ORAL
  Filled 2021-05-11: qty 1

## 2021-05-11 MED ORDER — FENTANYL CITRATE (PF) 100 MCG/2ML IJ SOLN: 25.0000 ug | INTRAMUSCULAR | Status: DC | PRN

## 2021-05-11 MED ORDER — MUPIROCIN 2 % EX OINT
1.0000 "application " | TOPICAL_OINTMENT | Freq: Two times a day (BID) | CUTANEOUS | Status: DC
  Administered 2021-05-11 – 2021-05-14 (×6): 1 via NASAL
  Filled 2021-05-11: qty 22

## 2021-05-11 MED ORDER — PROPOFOL 10 MG/ML IV BOLUS
INTRAVENOUS | Status: DC | PRN
  Administered 2021-05-11: 20 mg via INTRAVENOUS

## 2021-05-11 MED ORDER — PHENYLEPHRINE HCL (PRESSORS) 10 MG/ML IV SOLN
INTRAVENOUS | Status: AC
  Filled 2021-05-11: qty 1

## 2021-05-11 MED ORDER — SODIUM CHLORIDE 0.9 % IV SOLN: INTRAVENOUS | Status: DC | PRN

## 2021-05-11 MED ORDER — DEXMEDETOMIDINE (PRECEDEX) IN NS 20 MCG/5ML (4 MCG/ML) IV SYRINGE
PREFILLED_SYRINGE | INTRAVENOUS | Status: DC | PRN
  Administered 2021-05-11: 12 ug via INTRAVENOUS

## 2021-05-11 MED ORDER — ONDANSETRON HCL 4 MG/2ML IJ SOLN
INTRAMUSCULAR | Status: DC | PRN
  Administered 2021-05-11: 4 mg via INTRAVENOUS

## 2021-05-11 MED ORDER — BUPIVACAINE HCL 0.5 % IJ SOLN
INTRAMUSCULAR | Status: DC | PRN
  Administered 2021-05-11: 5 mL

## 2021-05-11 MED ORDER — LIDOCAINE HCL (PF) 1 % IJ SOLN
INTRAMUSCULAR | Status: DC | PRN
  Administered 2021-05-11: 5 mL

## 2021-05-11 MED ORDER — DEXMEDETOMIDINE HCL IN NACL 200 MCG/50ML IV SOLN
INTRAVENOUS | Status: DC | PRN
  Administered 2021-05-11: .4 ug/kg/h via INTRAVENOUS

## 2021-05-11 SURGICAL SUPPLY — 52 items
BLADE OSC/SAGITTAL MD 5.5X18 (BLADE) ×3 IMPLANT
BLADE SURG MINI STRL (BLADE) ×3 IMPLANT
BNDG CMPR STD VLCR NS LF 5.8X4 (GAUZE/BANDAGES/DRESSINGS) ×1
BNDG CONFORM 2 STRL LF (GAUZE/BANDAGES/DRESSINGS) ×3 IMPLANT
BNDG CONFORM 3 STRL LF (GAUZE/BANDAGES/DRESSINGS) ×6 IMPLANT
BNDG ELASTIC 4X5.8 VLCR NS LF (GAUZE/BANDAGES/DRESSINGS) ×3 IMPLANT
BNDG ELASTIC 4X5.8 VLCR STR LF (GAUZE/BANDAGES/DRESSINGS) ×1 IMPLANT
BNDG ESMARK 4X12 TAN STRL LF (GAUZE/BANDAGES/DRESSINGS) ×3 IMPLANT
BNDG GAUZE ELAST 4 BULKY (GAUZE/BANDAGES/DRESSINGS) ×3 IMPLANT
CNTNR SPEC 2.5X3XGRAD LEK (MISCELLANEOUS) ×1
CONT SPEC 4OZ STER OR WHT (MISCELLANEOUS) ×1
CONT SPEC 4OZ STRL OR WHT (MISCELLANEOUS) ×1
CONTAINER SPEC 2.5X3XGRAD LEK (MISCELLANEOUS) IMPLANT
CUFF TOURN SGL QUICK 12 (TOURNIQUET CUFF) IMPLANT
CUFF TOURN SGL QUICK 18X4 (TOURNIQUET CUFF) IMPLANT
DRAPE FLUOR MINI C-ARM 54X84 (DRAPES) ×3 IMPLANT
DRAPE XRAY CASSETTE 23X24 (DRAPES) ×3 IMPLANT
DURAPREP 26ML APPLICATOR (WOUND CARE) ×3 IMPLANT
ELECT REM PT RETURN 9FT ADLT (ELECTROSURGICAL) ×2
ELECTRODE REM PT RTRN 9FT ADLT (ELECTROSURGICAL) ×2 IMPLANT
GAUZE PACKING IODOFORM 1/2 (PACKING) ×3 IMPLANT
GAUZE SPONGE 4X4 12PLY STRL (GAUZE/BANDAGES/DRESSINGS) ×3 IMPLANT
GAUZE XEROFORM 1X8 LF (GAUZE/BANDAGES/DRESSINGS) ×3 IMPLANT
GLOVE SURG ENC MOIS LTX SZ7.5 (GLOVE) ×3 IMPLANT
GLOVE SURG UNDER LTX SZ8 (GLOVE) ×3 IMPLANT
GOWN STRL REUS W/ TWL XL LVL3 (GOWN DISPOSABLE) ×4 IMPLANT
GOWN STRL REUS W/TWL XL LVL3 (GOWN DISPOSABLE) ×4
IV NS IRRIG 3000ML ARTHROMATIC (IV SOLUTION) ×3 IMPLANT
KIT TURNOVER KIT A (KITS) ×3 IMPLANT
LABEL OR SOLS (LABEL) ×3 IMPLANT
MANIFOLD NEPTUNE II (INSTRUMENTS) ×3 IMPLANT
NDL FILTER BLUNT 18X1 1/2 (NEEDLE) ×2 IMPLANT
NDL HYPO 25X1 1.5 SAFETY (NEEDLE) ×2 IMPLANT
NEEDLE FILTER BLUNT 18X 1/2SAF (NEEDLE) ×1
NEEDLE FILTER BLUNT 18X1 1/2 (NEEDLE) ×1 IMPLANT
NEEDLE HYPO 25X1 1.5 SAFETY (NEEDLE) ×2 IMPLANT
NS IRRIG 500ML POUR BTL (IV SOLUTION) ×3 IMPLANT
PACK EXTREMITY ARMC (MISCELLANEOUS) ×3 IMPLANT
PAD ABD DERMACEA PRESS 5X9 (GAUZE/BANDAGES/DRESSINGS) ×5 IMPLANT
PULSAVAC PLUS IRRIG FAN TIP (DISPOSABLE) ×2
SHIELD FULL FACE ANTIFOG 7M (MISCELLANEOUS) ×3 IMPLANT
STOCKINETTE M/LG 89821 (MISCELLANEOUS) ×3 IMPLANT
STRAP SAFETY 5IN WIDE (MISCELLANEOUS) ×3 IMPLANT
SUCTION FRAZIER HANDLE 10FR (MISCELLANEOUS) ×2
SUCTION TUBE FRAZIER 10FR DISP (MISCELLANEOUS) IMPLANT
SUT ETHILON 3-0 FS-10 30 BLK (SUTURE) ×4
SUT ETHILON 5-0 FS-2 18 BLK (SUTURE) ×3 IMPLANT
SUT VIC AB 4-0 FS2 27 (SUTURE) ×3 IMPLANT
SUTURE EHLN 3-0 FS-10 30 BLK (SUTURE) ×2 IMPLANT
SYR 10ML LL (SYRINGE) ×9 IMPLANT
TIP FAN IRRIG PULSAVAC PLUS (DISPOSABLE) ×2 IMPLANT
WATER STERILE IRR 500ML POUR (IV SOLUTION) ×3 IMPLANT

## 2021-05-11 NOTE — Op Note (Signed)
Operative note   Surgeon:Yuepheng Schaller [employer] forces logistics/support/administrative officer: None    Preop diagnosis: Osteomyelitis left great toe    Postop diagnosis: Same    Procedure: Amputation left great toe first MTPJ    EBL: Minimal    Anesthesia:local and IV sedation    Hemostasis: None    Specimen: Deep wound culture and amputation for pathology    Complications: None    Operative indications:Miguel Harvey is an 76 y.o. that presents today for surgical intervention.  The risks/benefits/alternatives/complications have been discussed and consent has been given.    Procedure:  Patient was brought into the OR and placed on the operating table in thesupine position. After anesthesia was obtained theleft lower extremity was prepped and draped in usual sterile fashion.  Attention was directed to the left forefoot where 2 semielliptical fishmouth type of incisions were made around the MTPJ.  Full-thickness flaps were created.  The toe was then disarticulated at the metatarsophalangeal joint.  This was sent for pathological examination.  The proximal margin was inked.  A deep wound culture at the ulcerative site was performed.  This was sent for routine culture.  All bleeders were Bovie cauterized.  The wound was flushed with copious amounts of irrigation.  Closure was then performed with a 3-0 nylon.  A sterile dressing was applied.    Patient tolerated the procedure and anesthesia well.  Was transported from the OR to the PACU with all vital signs stable and vascular status intact. To be discharged per routine protocol.

## 2021-05-11 NOTE — Anesthesia Preprocedure Evaluation (Signed)
Anesthesia Evaluation  Patient identified by MRN, date of birth, ID band Patient awake    Reviewed: Allergy & Precautions, H&P , NPO status , Patient's Chart, lab work & pertinent test results, reviewed documented beta blocker date and time   History of Anesthesia Complications (+) history of anesthetic complications  Airway Mallampati: II  TM Distance: >3 FB Neck ROM: full    Dental no notable dental hx. (+) Teeth Intact   Pulmonary COPD, Current Smoker and Patient abstained from smoking.,    Pulmonary exam normal breath sounds clear to auscultation       Cardiovascular Exercise Tolerance: Poor hypertension, On Medications + CAD, + Peripheral Vascular Disease and +CHF   Rhythm:regular Rate:Normal     Neuro/Psych PSYCHIATRIC DISORDERS Depression  Neuromuscular disease    GI/Hepatic negative GI ROS, Neg liver ROS,   Endo/Other  negative endocrine ROSdiabetes, Poorly Controlled, Type 1, Insulin Dependent  Renal/GU Renal disease     Musculoskeletal   Abdominal   Peds  Hematology negative hematology ROS (+)   Anesthesia Other Findings   Reproductive/Obstetrics negative OB ROS                             Anesthesia Physical Anesthesia Plan  ASA: 4 and emergent  Anesthesia Plan: MAC   Post-op Pain Management:    Induction:   PONV Risk Score and Plan: 1  Airway Management Planned:   Additional Equipment:   Intra-op Plan:   Post-operative Plan:   Informed Consent: I have reviewed the patients History and Physical, chart, labs and discussed the procedure including the risks, benefits and alternatives for the proposed anesthesia with the patient or authorized representative who has indicated his/her understanding and acceptance.       Plan Discussed with: CRNA  Anesthesia Plan Comments:         Anesthesia Quick Evaluation

## 2021-05-11 NOTE — Transfer of Care (Signed)
Immediate Anesthesia Transfer of Care Note  Patient: Miguel Harvey  Procedure(s) Performed: LEFT GREAT  TOE AMPUTATION (Left: Toe)  Patient Location: PACU  Anesthesia Type:MAC  Level of Consciousness: awake  Airway & Oxygen Therapy: Patient Spontanous Breathing  Post-op Assessment: Report given to RN and Post -op Vital signs reviewed and stable  Post vital signs: Reviewed and stable  Last Vitals:  Vitals Value Taken Time  BP 118/66 05/11/21 0852  Temp 6f  Pulse 67 05/11/21 0852  Resp 20 05/11/21 0854  SpO2 98 % 05/11/21 0852  Vitals shown include unvalidated device data.  Last Pain:  Vitals:   05/11/21 0401  TempSrc: Oral  PainSc:       Patients Stated Pain Goal: 0 ([id number]  Complications: No notable events documented.

## 2021-05-11 NOTE — Progress Notes (Signed)
Tele called and said patient had 16 bts of PVCs and SVTs. Patient A&O, no symptom noted, denied any discomfort. On-call notified but no new order given. We continue to monitor.

## 2021-05-11 NOTE — Progress Notes (Signed)
Progress Note   Patient: Miguel Harvey [id number] DOB: 03/25/1946 DOA: 05/08/2021     3 DOS: the patient was seen and examined on 05/11/2021   Brief hospital course: Patient admitted with sepsis due to likely osteomyelitis of the left great toe after presenting with fever of 102.9 and leukocytosis.  He had no specific complaints on admission but had fallen at SNF and noted to have generalized weakness.  Podiatry and Infectious Disease are consulted.   Assessment and Plan: * Sepsis ([place])- (present on admission) Present on admission with fever of 102.9 and leukocytosis 15.8.  Ssource is left great toe osteomyelitis.  Treated per sepsis protocol on admission.  Continue IV antibiotics and follow cultures.  Monitor hemodynamics closely.  Foot osteomyelitis, left ([place])- (present on admission) Acuity is not clear by the x-ray. MRI left foot confirmed acute osteomyelitis. Initially on broad-spectrum IV antibiotics. On Unasyn per ID. Follow wound culture. Pt later agreed to amputation, done this AM. Consults: Podiatry, Infectious Disease   Chronic systolic CHF (congestive heart failure) ([place])- (present on admission) Patient appears euvolemic on admission.  Continue home Lasix and Coreg.  Monitor volume status.  PAD (peripheral artery disease) ([place])- (present on admission) With prior right BKA, followed by vascular surgery.  Now with left great toe osteomyelitis.   Continue aspirin and Plavix.  HTN (hypertension), malignant- (present on admission) Blood pressure labile on admission. Continue home Coreg and Lasix.   Monitor blood pressure.  Type 2 diabetes mellitus ([place]) Uncontrolled with Hbg A1c 9.1%. Hyperglycemic on admission glucose 279.  Patient had normal bicarb and normal anion gap, however beta hydroxybutyrate was checked in the ED and mildly elevated he was therefore treated with IV insulin in the ED.  Patient does not have DKA or HHS however, will resume subcutaneous  insulin. -- Continue basal insulin at home dose -- Sliding scale NovoLog  Pressure injury of skin- (present on admission) Present on admission. Frequently reposition patient. Local wound care and close monitoring for signs of infection.          TOBACCO DEPENDENCE- (present on admission) Counseled on importance of cessation.  Hyperlipidemia associated with type 2 diabetes mellitus ([place])- (present on admission) Continue Lipitor        Subjective: Pt seen just after returning to the floor from surgery.  He and family ultimately agreed to L great toe amputation.  He wakes briefly and denies pain or other complaints.   Physical Exam: Vitals:   05/11/21 0900 05/11/21 0915 05/11/21 0930 05/11/21 0945  BP: 130/70 128/62 (!) 122/58 124/60  Pulse:   64 63  Resp: 17 17 (!) 8 18  Temp:  97.8 F (36.6 C)  97.9 F (36.6 C)  TempSrc:    Oral  SpO2: 100% 100% 97% 100%  Weight:      Height:       General exam: sleeping comfortably, no acute distress Respiratory system: CTAB, no wheezes, rales or rhonchi, normal respiratory effort. Cardiovascular system: normal S1/S2, RRR, no peripheral edema.   Gastrointestinal system: soft, non-tender abdomen Central nervous system: Ano gross focal neurologic deficits, normal speech Extremities: R BKA, left foot with clean dry intact ace bandage, normal tone Psychiatry: unable to assess due to somnolence   Data Reviewed:  Labs notable for CBG's controlled.  Family Communication: none  Disposition: Status is: Inpatient Remains inpatient appropriate because: severity of illness remaining on IV antibiotics pending cultures          Planned Discharge Destination: Skilled nursing facility  Time spent: 35 minutes  Author: Ezekiel Slocumb, DO 05/11/2021 2:07 PM  For on call review [email or web address].

## 2021-05-12 ENCOUNTER — Encounter: Payer: Self-pay | Admitting: Podiatry

## 2021-05-12 ENCOUNTER — Inpatient Hospital Stay: Payer: Self-pay

## 2021-05-12 DIAGNOSIS — M869 Osteomyelitis, unspecified: Secondary | ICD-10-CM | POA: Diagnosis not present

## 2021-05-12 DIAGNOSIS — E669 Obesity, unspecified: Secondary | ICD-10-CM

## 2021-05-12 DIAGNOSIS — R7881 Bacteremia: Secondary | ICD-10-CM | POA: Diagnosis present

## 2021-05-12 DIAGNOSIS — E1151 Type 2 diabetes mellitus with diabetic peripheral angiopathy without gangrene: Secondary | ICD-10-CM | POA: Diagnosis not present

## 2021-05-12 DIAGNOSIS — I739 Peripheral vascular disease, unspecified: Secondary | ICD-10-CM

## 2021-05-12 DIAGNOSIS — M86172 Other acute osteomyelitis, left ankle and foot: Secondary | ICD-10-CM | POA: Diagnosis not present

## 2021-05-12 DIAGNOSIS — E1169 Type 2 diabetes mellitus with other specified complication: Secondary | ICD-10-CM | POA: Diagnosis not present

## 2021-05-12 DIAGNOSIS — L089 Local infection of the skin and subcutaneous tissue, unspecified: Secondary | ICD-10-CM

## 2021-05-12 DIAGNOSIS — A419 Sepsis, unspecified organism: Secondary | ICD-10-CM | POA: Diagnosis not present

## 2021-05-12 LAB — GLUCOSE, CAPILLARY
Glucose-Capillary: 136 mg/dL — ABNORMAL HIGH (ref 70–99)
Glucose-Capillary: 95 mg/dL (ref 70–99)
Glucose-Capillary: 110 mg/dL — ABNORMAL HIGH (ref 70–99)
Glucose-Capillary: 141 mg/dL — ABNORMAL HIGH (ref 70–99)
Glucose-Capillary: 196 mg/dL — ABNORMAL HIGH (ref 70–99)
Glucose-Capillary: 159 mg/dL — ABNORMAL HIGH (ref 70–99)

## 2021-05-12 LAB — CULTURE, BLOOD (ROUTINE X 2)
Special Requests: ADEQUATE
Special Requests: ADEQUATE

## 2021-05-12 NOTE — Progress Notes (Signed)
° °  Date of Admission:  05/08/2021    ID: Miguel Harvey is a 76 y.o. male  Principal Problem:   Sepsis ([place]) Active Problems:   TOBACCO DEPENDENCE   Type 2 diabetes mellitus ([place])   HTN (hypertension), malignant   Hyperlipidemia associated with type 2 diabetes mellitus ([place])   Pressure injury of skin   Foot osteomyelitis, left (HCC)   PAD (peripheral artery disease) (HCC)   Chronic systolic CHF (congestive heart failure) (HCC)   Positive blood cultures    Subjective: Doing fine . No specific complaints Had left great toe amputation yesterday  Medications:   aspirin EC  81 mg Oral Daily   atorvastatin  20 mg Oral QPM   carvedilol  6.25 mg Oral BID WC   clopidogrel  75 mg Oral Daily   enoxaparin (LOVENOX) injection  40 mg Subcutaneous Q24H   furosemide  20 mg Oral Daily   insulin aspart  0-15 Units Subcutaneous Q4H   insulin glargine-yfgn  8 Units Subcutaneous Daily   linagliptin  5 mg Oral Daily   magnesium oxide  400 mg Oral BID   multivitamin with minerals  1 tablet Oral Daily   mupirocin ointment  1 application Nasal BID   senna  1 tablet Oral BID   sertraline  25 mg Oral Daily   tamsulosin  0.4 mg Oral Daily    Objective: Vital signs in last 24 hours: Temp:  [97.7 F (36.5 C)-99.1 F (37.3 C)] 98 F (36.7 C) (02/13 0828) Pulse Rate:  [67-70] 68 (02/13 0828) Resp:  [18-20] 20 (02/13 0828) BP: (137-152)/(71-87) 139/71 (02/13 0828) SpO2:  [95 %-100 %] 97 % (02/13 0828)  PHYSICAL EXAM:  General: Alert, cooperative, no distress, Lungs: Clear to auscultation bilaterally. No Wheezing or Rhonchi. No rales. Heart: Regular rate and rhythm, no murmur, rub or gallop. Abdomen: Soft, non-tender,not distended. Bowel sounds normal. No masses Extremities:    Pre amputation   atraumatic, no cyanosis. No edema. No clubbing Skin: No rashes or lesions. Or bruising Lymph: Cervical, supraclavicular normal. Neurologic: Grossly non-focal  Lab Results Recent Labs     05/10/21 0322  WBC 10.5  HGB 9.9*  HCT 30.9*  NA 135  K 3.7  CL 104  CO2 22  BUN 15  CREATININE 1.09     Assessment/Plan: Left great toe infection with osteomyelitis.  Amputation of the great toe.  Already has had amputation of the second to the fifth toe in the past. This looks like a therapeutic amputation Cultures had Streptococcus group G Patient is currently on Unasyn Will need Augmentin 875 mg p.o. twice daily until 05/17/2021.  Anemia of chronic disease  Diabetes mellitus on insulin and Tradjenta  CKD  Peripheral arterial disease with right AKA right AKA  Discussed the management with the patient and the hospitalist. ID will sign off.  Call if needed.

## 2021-05-12 NOTE — Care Management Important Message (Signed)
Important Message  Patient Details  Name: Miguel Harvey MRN: [id number] Date of Birth: 27-Nov-1945   Medicare Important Message Given:  Yes     [name] 05/12/2021, 10:56 AM

## 2021-05-12 NOTE — Progress Notes (Signed)
PODIATRY / FOOT AND ANKLE SURGERY PROGRESS NOTE  Chief Complaint: Left foot infection   HPI: Miguel Harvey is a 76 y.o. male who presents status post 1 day left hallux amputation with Dr. Vickki Muff.  Patient notes he has minimal to no pain at this time the left foot and has kept dressings clean, dry, and intact.  Patient has not put weight on his foot since surgery.  Patient currently denies nausea, vomiting, fever, chills.  PMHx:  Past Medical History:  Diagnosis Date   Abnormal posture    CHF (congestive heart failure) (HCC)    Chronic kidney disease    CKD   stage 2   Complication of anesthesia    heart stopped x2 during toe amputation   COPD (chronic obstructive pulmonary disease) (HCC)    Coronary artery disease, non-occlusive    a. LHC 05/2011: 10-20% ostial LAD stenosis   Depression    Diabetes mellitus    HLD (hyperlipidemia)    Hypertension    Muscle weakness    Neuromuscular disorder (HCC)    neuropathy   NICM (nonischemic cardiomyopathy) ([place])    a. TTE 2/213: EF 50-55%, mild LVH, normal wall motion, Gr2DD, mildly dilated LA; b. TTE 2/13: EF 40-45%, mild concentric LVH, global HK, no evidence of LV thrombus; c. TTE 4/19: EF 40%, diffuse HK, Gr2DD, mild MR, mildly dilated LA, RV cavity size normal w/ normal RVSF, PASP 63; d. TTE 4/19: EF 25-30%, diffuse HK, mild LVH, mild MR, mild PASP; e. TTE 5/19: EF 20-25%, diffuse HK, moderate MR,   NSVT (nonsustained ventricular tachycardia)    PAD (peripheral artery disease) ([place])    a. s/p right BKA s/p  recent balloon angioplasty fo the left peroneal artery, left posterior tibial artery, tibioperoneal trunk, and stenting to the distal left SFA in 07/2017   PEA (Pulseless electrical activity) ([place])    heart stopped beating during surgery for amputation of toes    Surgical Hx:  Past Surgical History:  Procedure Laterality Date   AMPUTATION Right 07/02/2017   Procedure: RIGHT BELOW KNEE AMPUTATION;  Surgeon: Newt Minion, MD;   Location: [place];  Service: Orthopedics;  Laterality: Right;   AMPUTATION TOE Left 05/11/2021   Procedure: LEFT GREAT  TOE AMPUTATION;  Surgeon: Samara Deist, DPM;  Location: [facility];  Service: Podiatry;  Laterality: Left;   CARDIAC CATHETERIZATION     CATARACT EXTRACTION W/PHACO Left 11/08/2018   Procedure: CATARACT EXTRACTION PHACO AND INTRAOCULAR LENS PLACEMENT ([place])  LEFT VISION BLUE;  Surgeon: Marchia Meiers, MD;  Location: [facility];  Service: Ophthalmology;  Laterality: Left;  [place] 03:38.9 CDE 42.37 Fluid pack lot # [id number]   cyst removal from  right leg about 6 years ago     [place] Left 08/10/2017   Procedure: IRRIGATION AND DEBRIDEMENT FOOT;  Surgeon: Samara Deist, DPM;  Location: [facility];  Service: Podiatry;  Laterality: Left;   LEFT HEART CATHETERIZATION WITH CORONARY ANGIOGRAM N/A 05/11/2011   Procedure: LEFT HEART CATHETERIZATION WITH CORONARY ANGIOGRAM;  Surgeon: Troy Sine, MD;  [facility];  Service: Cardiovascular;  Laterality: N/A;   LOWER EXTREMITY ANGIOGRAPHY Left 08/13/2017   Procedure: Lower Extremity Angiography and PICC line placement;  Surgeon: Algernon Huxley, MD;  Location: [facility];  Service: Cardiovascular;  Laterality: Left;   ruptured navel     repair of   TOE AMPUTATION     not sure of when this surgery occured, but his heart stopped  beating x 2 during surgery    FHx:  Family History  Problem Relation Age of Onset   CAD Other    Diabetes Other     Social History:  reports that he has been smoking cigarettes. He quit smokeless tobacco use about 10 years ago. He reports that he does not currently use alcohol after a past usage of about 6.0 standard drinks per week. He reports that he does not use drugs.  Allergies:  Allergies  Allergen Reactions   Spironolactone     Hyperkalemia (currently takes spironolactone)    Medications Prior to Admission  Medication Sig Dispense Refill   aspirin EC 81 MG EC tablet  Take 1 tablet (81 mg total) by mouth daily. 30 tablet 0   atorvastatin (LIPITOR) 20 MG tablet Take 20 mg by mouth every evening.      carvedilol (COREG) 6.25 MG tablet Take 6.25 mg by mouth 2 (two) times daily with a meal.     clopidogrel (PLAVIX) 75 MG tablet Take 1 tablet (75 mg total) by mouth daily. 30 tablet 0   furosemide (LASIX) 20 MG tablet Take 20 mg by mouth daily.     insulin lispro (HUMALOG) 100 UNIT/ML KwikPen Inject 5 Units into the skin 3 (three) times daily. HOLD IF CBG < or = 150     LANTUS SOLOSTAR 100 UNIT/ML Solostar Pen Inject 8 Units into the skin daily.     linagliptin (TRADJENTA) 5 MG TABS tablet Take 5 mg by mouth daily.     Magnesium 400 MG TABS Take 400 mg by mouth 2 (two) times daily.      metFORMIN (GLUCOPHAGE) 500 MG tablet Take 500 mg by mouth 2 (two) times daily with a meal.      Multiple Vitamin (MULTIVITAMIN WITH MINERALS) TABS tablet Take 1 tablet by mouth daily.     sertraline (ZOLOFT) 25 MG tablet Take 25 mg by mouth daily.      tamsulosin (FLOMAX) 0.4 MG CAPS capsule Take 1 capsule (0.4 mg total) by mouth daily. 30 capsule 0   ciclopirox (LOPROX) 0.77 % cream Apply 1 application topically daily. (Patient not taking: Reported on 05/08/2021)     Dulaglutide (TRULICITY) 1.5 0000000 SOPN Inject 1.5 mg into the skin every Friday.     Probiotic Product (ALIGN) 4 MG CAPS Take 4 mg by mouth daily. (Patient not taking: Reported on 05/08/2021)      Physical Exam: General: Alert and oriented.  No apparent distress.  Vascular: Left DP/PT pulses nonpalpable, capillary fill time appears to be intact to dorsal and plantar flaps left foot.  Mild left lower extremity nonpitting edema present.  Neuro: Light touch sensation absent to left lower extremity  Derm: Left hallux amputation site appears to be well coapted with sutures intact, no obvious dehiscence present, no obvious signs of infection overall, mild edema and erythema consistent with postoperative  course.     MSK: Right BKA, left transmetatarsal amputation  Results for orders placed or performed during the hospital encounter of 05/08/21 (from the past 48 hour(s))  Glucose, capillary     Status: Abnormal   Collection Time: 05/10/21  3:54 PM  Result Value Ref Range   Glucose-Capillary 173 (H) 70 - 99 mg/dL    Comment: Glucose reference range applies only to samples taken after fasting for at least 8 hours.  Glucose, capillary     Status: Abnormal   Collection Time: 05/10/21  8:38 PM  Result Value Ref Range   Glucose-Capillary  179 (H) 70 - 99 mg/dL    Comment: Glucose reference range applies only to samples taken after fasting for at least 8 hours.  Glucose, capillary     Status: Abnormal   Collection Time: 05/10/21 11:38 PM  Result Value Ref Range   Glucose-Capillary 249 (H) 70 - 99 mg/dL    Comment: Glucose reference range applies only to samples taken after fasting for at least 8 hours.   Comment 1 Notify RN   Glucose, capillary     Status: Abnormal   Collection Time: 05/11/21  3:43 AM  Result Value Ref Range   Glucose-Capillary 209 (H) 70 - 99 mg/dL    Comment: Glucose reference range applies only to samples taken after fasting for at least 8 hours.   Comment 1 Notify RN   Surgical PCR screen     Status: Abnormal   Collection Time: 05/11/21  4:48 AM   Specimen: Nasal Mucosa; Nasal Swab  Result Value Ref Range   MRSA, PCR NEGATIVE NEGATIVE   Staphylococcus aureus POSITIVE (A) NEGATIVE    Comment: (NOTE) The Xpert SA Assay (FDA approved for NASAL specimens in patients 77 years of age and older), is one component of a comprehensive surveillance program. It is not intended to diagnose infection nor to guide or monitor treatment. Performed at [facility], [address] [place], [place] [address]   Glucose, capillary     Status: Abnormal   Collection Time: 05/11/21  7:51 AM  Result Value Ref Range   Glucose-Capillary 136 (H) 70 - 99 mg/dL    Comment:  Glucose reference range applies only to samples taken after fasting for at least 8 hours.  Aerobic/Anaerobic Culture w Gram Stain (surgical/deep wound)     Status: None (Preliminary result)   Collection Time: 05/11/21  8:30 AM   Specimen: PATH Digit amputation; Tissue  Result Value Ref Range   Specimen Description      TOE LEFT GREAT Performed at [facility], [address] [place], [place] [address]    Special Requests      NONE Performed at [facility], [place], [place] [address]    Gram Stain      RARE WBC PRESENT, PREDOMINANTLY PMN RARE SQUAMOUS EPITHELIAL CELLS PRESENT RARE GRAM POSITIVE COCCI    Culture      CULTURE REINCUBATED FOR BETTER GROWTH Performed at [facility], [place] [address], [place],  [address]    Report Status PENDING   Glucose, capillary     Status: Abnormal   Collection Time: 05/11/21  8:55 AM  Result Value Ref Range   Glucose-Capillary 141 (H) 70 - 99 mg/dL    Comment: Glucose reference range applies only to samples taken after fasting for at least 8 hours.  Glucose, capillary     Status: Abnormal   Collection Time: 05/11/21 12:00 PM  Result Value Ref Range   Glucose-Capillary 156 (H) 70 - 99 mg/dL    Comment: Glucose reference range applies only to samples taken after fasting for at least 8 hours.  Glucose, capillary     Status: Abnormal   Collection Time: 05/11/21  4:50 PM  Result Value Ref Range   Glucose-Capillary 173 (H) 70 - 99 mg/dL    Comment: Glucose reference range applies only to samples taken after fasting for at least 8 hours.  Glucose, capillary     Status: Abnormal   Collection Time: 05/11/21  7:33 PM  Result Value Ref Range  Glucose-Capillary 156 (H) 70 - 99 mg/dL    Comment: Glucose reference range applies only to samples taken after fasting for at least 8 hours.  Glucose, capillary     Status: Abnormal   Collection Time: 05/11/21 10:55 PM  Result Value Ref Range    Glucose-Capillary 134 (H) 70 - 99 mg/dL    Comment: Glucose reference range applies only to samples taken after fasting for at least 8 hours.  Glucose, capillary     Status: None   Collection Time: 05/12/21  4:02 AM  Result Value Ref Range   Glucose-Capillary 95 70 - 99 mg/dL    Comment: Glucose reference range applies only to samples taken after fasting for at least 8 hours.  Glucose, capillary     Status: Abnormal   Collection Time: 05/12/21  8:28 AM  Result Value Ref Range   Glucose-Capillary 110 (H) 70 - 99 mg/dL    Comment: Glucose reference range applies only to samples taken after fasting for at least 8 hours.   Comment 1 Notify RN    Comment 2 Document in Chart   Glucose, capillary     Status: Abnormal   Collection Time: 05/12/21 11:23 AM  Result Value Ref Range   Glucose-Capillary 141 (H) 70 - 99 mg/dL    Comment: Glucose reference range applies only to samples taken after fasting for at least 8 hours.   Comment 1 Notify RN    Comment 2 Document in Chart    [place] EKG SITE RITE  Result Date: 05/12/2021 If Site Rite image not attached, placement could not be confirmed due to current cardiac rhythm.   Blood pressure 139/71, pulse 68, temperature 98 F (36.7 C), temperature source Oral, resp. rate 20, height 6' (1.829 m), weight 99.8 kg, SpO2 97 %.  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 -Redressed today with Xeroform to the incision line followed by 4 x 4 gauze, ABD, Kerlix, Ace wrap.  Patient to keep dressings clean, dry, and intact for the next week until postoperative visit in outpatient clinic. -Patient may be partial weightbearing with heel contact and surgical shoe for transfers until incision  heals.  Will likely take 2 to 3 weeks for the incision to heal but could take longer due to patient's uncontrolled diabetes and history of PVD. -Wound culture growing strep bacteria sensitive to penicillin.  Recommend discharge on 7-day course of Augmentin. -Appreciate PT recommendations.  Podiatry team to sign off at this time.  Patient to follow-up in clinic with Dr. Vickki Muff 1 week after discharge date.  Caroline More 05/12/2021, 1:08 PM

## 2021-05-12 NOTE — Anesthesia Postprocedure Evaluation (Signed)
Anesthesia Post Note  Patient: Miguel Harvey  Procedure(s) Performed: LEFT GREAT  TOE AMPUTATION (Left: Toe)  Patient location during evaluation: PACU Anesthesia Type: MAC Level of consciousness: awake and alert Pain management: pain level controlled Vital Signs Assessment: post-procedure vital signs reviewed and stable Respiratory status: spontaneous breathing, nonlabored ventilation, respiratory function stable and patient connected to nasal cannula oxygen Cardiovascular status: blood pressure returned to baseline and stable Postop Assessment: no apparent nausea or vomiting Anesthetic complications: no   No notable events documented.   Last Vitals:  Vitals:   05/11/21 1935 05/12/21 0411  BP: (!) 143/73 137/76  Pulse: 68 70  Resp: 20 20  Temp: 36.8 C 37.3 C  SpO2: 95% 100%    Last Pain:  Vitals:   05/12/21 0445  TempSrc:   PainSc: Clinton Diandra Cimini

## 2021-05-12 NOTE — Assessment & Plan Note (Signed)
Admission blood culture grew Staph capitis in the aerobic bottle only.  Contaminant suspected.  Organism isolated from left foot wound culture growing few group G strep.  Continue antibiotics per ID.

## 2021-05-12 NOTE — Progress Notes (Signed)
Progress Note   Patient: Miguel Harvey:[id number] DOB: [birth year] DOA: 05/08/2021     4 DOS: the patient was seen and examined on 05/12/2021   Brief hospital course: Patient admitted with sepsis due to likely osteomyelitis of the left great toe after presenting with fever of 102.9 and leukocytosis.  He had no specific complaints on admission but had fallen at SNF and noted to have generalized weakness.  Podiatry and Infectious Disease are consulted.   Assessment and Plan: * Sepsis (HCC)- (present on admission) Present on admission with fever of 102.9 and leukocytosis 15.8.  Ssource is left great toe osteomyelitis.  Treated per sepsis protocol on admission.  Continue IV antibiotics and follow cultures.  Monitor hemodynamics closely.  Sepsis physiology has resolved.  Foot osteomyelitis, left (HCC)- (present on admission) Acuity is not clear by the x-ray. MRI left foot confirmed acute osteomyelitis. Initially on broad-spectrum IV antibiotics. On Unasyn per ID. Follow wound and operative cultures. Status post left great toe amputation on 2/12. Consults: Podiatry, Infectious Disease   Chronic systolic CHF (congestive heart failure) (HCC)- (present on admission) Patient appears euvolemic on admission.  Continue home Lasix and Coreg.  Monitor volume status.  PAD (peripheral artery disease) (HCC)- (present on admission) With prior right BKA, followed by vascular surgery.  Now with left great toe osteomyelitis status post amputation.   Continue aspirin and Plavix.  HTN (hypertension), malignant- (present on admission) Blood pressure labile on admission. Continue home Coreg and Lasix.   Monitor blood pressure.  Type 2 diabetes mellitus (HCC) Uncontrolled with Hbg A1c 9.1%. Hyperglycemic on admission glucose 279.  Patient had normal bicarb and normal anion gap, however beta hydroxybutyrate was checked in the ED and mildly elevated he was therefore treated with IV insulin in the ED.   Patient does not have DKA or HHS however, will resume subcutaneous insulin. -- Continue basal insulin at home dose -- Sliding scale NovoLog  Pressure injury of skin- (present on admission) Present on admission. Frequently reposition patient. Local wound care and close monitoring for signs of infection.          TOBACCO DEPENDENCE- (present on admission) Counseled on importance of cessation.  Positive blood cultures- (present on admission) Admission blood culture grew Staph capitis in the aerobic bottle only.  Contaminant suspected.  Organism isolated from left foot wound culture growing few group G strep.  Continue antibiotics per ID.  Hyperlipidemia associated with type 2 diabetes mellitus (HCC)- (present on admission) Continue Lipitor        Subjective: Patient awake resting in bed when seen today.  He reports pain overall controlled with medication.  No fevers chills or any other acute complaints.  He hopes not to require PICC line  Physical Exam: Vitals:   05/11/21 1649 05/11/21 1935 05/12/21 0411 05/12/21 0828  BP: (!) 152/87 (!) 143/73 137/76 139/71  Pulse: 67 68 70 68  Resp: 18 20 20 20   Temp: 97.7 F (36.5 C) 98.3 F (36.8 C) 99.1 F (37.3 C) 98 F (36.7 C)  TempSrc: Oral Oral Oral Oral  SpO2: 100% 95% 100% 97%  Weight:      Height:       General exam: awake, alert, no acute distress HEENT: moist mucus membranes, hearing grossly normal  Respiratory system: CTAB, no wheezes, rales or rhonchi, normal respiratory effort. Cardiovascular system: normal S1/S2, RRR Central nervous system: A&O x3. no gross focal neurologic deficits, normal speech Extremities: Right BKA, left foot with clean dry intact Ace bandage, no  edema, normal tone Skin: dry, intact, normal temperature Psychiatry: normal mood, congruent affect, judgement and insight appear normal   Data Reviewed:  Labs reviewed and notable for CBGs at goal .    Culture data reviewed: --Left foot  wound culture growing few group G strep --Tissue/bone culture from surgery reintubated for better growth but showing rare gram-positive cocci --Admission blood culture with Staph capitis in aerobic bottle only   Family Communication: None  Disposition: Status is: Inpatient Remains inpatient appropriate because: Severity of illness remaining on empiric IV antibiotic pending operative cultures to guide discharge antibiotic therapy          Planned Discharge Destination: Skilled nursing facility (resides at [facility])     Time spent: 35 minutes  Author: Pennie Banter, DO 05/12/2021 1:16 PM  For on call review [email or web address].

## 2021-05-12 NOTE — Progress Notes (Signed)
Secure chat with ID and Denton Lank MD re: PICC placement, per MD to hold off PICC line insertion. Patient has 2 PIVs per flowsheet.

## 2021-05-12 NOTE — Evaluation (Signed)
Physical Therapy Evaluation Patient Details Name: Miguel Harvey MRN: [id number] DOB: 07-12-1945 Today's Date: 05/12/2021  History of Present Illness  Patient is a 76 year old male who reports to [facility] for osteomyelitis of the L great toe. Left great toe amputation completed on 05/11/21. Patient has a PMH (+) for CKD< CAD, COPD, DM, HLD, RT BKA I 2019, PAD, left foot infection with osteo 5th toe in May 2019, s/p partial ray amputation of 2,3,4,5 metatarsals on 08/10/17.   Clinical Impression  Physical Therapy Evaluation completed this date. Patient tolerated session well and was agreeable to treatment. Upon arrival patient was supine in bed resting. Awoke easily to his name. Patient reported no pain, and was A&Ox3. At patients baseline, he was wheelchair bound, and "would only stand up when he wanted to". When patient was asked how often he would say that is, patient replied "only when I feel like it". Patient came from [place].   Patient demonstrated BUE strength WFL, and BLE weakness (LLE>RLE) at least 3/5 strength. He was able to complete all bed mobility Mod I. Sitting balance was good with no UE support and feet unsupported. Patient was able to complete x2 sit to stands from an elevated surface with his RW for stability. Standing balance was fair. He required Max A in standing to help clean patient's behind. Due to BLE weakness, patient was only able to tolerate ~2-3 minutes in standing before fatiguing and requesting to sit. Patient declined walking as he "would not walk without his RLE prosthetic. Patient demonstrated near/at baseline level of function, and does not require additional skilled physical therapy. Signing off.    Recommendations for follow up therapy are one component of a multi-disciplinary discharge planning process, led by the attending physician.  Recommendations may be updated based on patient status, additional functional criteria and insurance  authorization.  Follow Up Recommendations No PT follow up    Assistance Recommended at Discharge    Patient can return home with the following  A little help with walking and/or transfers;A little help with bathing/dressing/bathroom;Assistance with cooking/housework;Direct supervision/assist for financial management;Direct supervision/assist for medications management;Assist for transportation;Help with stairs or ramp for entrance    Equipment Recommendations None recommended by PT  Recommendations for Other Services       Functional Status Assessment Patient has not had a recent decline in their functional status     Precautions / Restrictions Precautions Precautions: Fall Restrictions Weight Bearing Restrictions: No      Mobility  Bed Mobility Overal bed mobility: Modified Independent                  Transfers Overall transfer level: Needs assistance Equipment used: Rolling walker (2 wheels) Transfers: Sit to/from Stand Sit to Stand: Min guard                Ambulation/Gait Ambulation/Gait assistance:  (patient declined- stated he will not walk without prostetic (R))                Stairs            Wheelchair Mobility    Modified Rankin (Stroke Patients Only)       Balance Overall balance assessment: Needs assistance Sitting-balance support: No upper extremity supported, Feet unsupported Sitting balance-Leahy Scale: Good     Standing balance support: Bilateral upper extremity supported, Reliant on assistive device for balance, During functional activity Standing balance-Leahy Scale: Fair Standing balance comment: mild unsteadiness in standing, can only stand  for short periods of time before requesting to sit; reliant on AD for stability                             Pertinent Vitals/Pain Pain Assessment Pain Assessment: No/denies pain    Home Living Family/patient expects to be discharged to:: Skilled nursing  facility                        Prior Function Prior Level of Function : Independent/Modified Independent             Mobility Comments: WC bound "I could stand and walk if i wanted to though" ADLs Comments: Mod I/ Independent     Hand Dominance   Dominant Hand: Right    Extremity/Trunk Assessment   Upper Extremity Assessment Upper Extremity Assessment: Overall WFL for tasks assessed    Lower Extremity Assessment Lower Extremity Assessment: Generalized weakness;RLE deficits/detail;LLE deficits/detail RLE Deficits / Details: 3+/5 strength LLE Deficits / Details: 3/5 strength       Communication   Communication: No difficulties  Cognition Arousal/Alertness: Awake/alert Behavior During Therapy: WFL for tasks assessed/performed Overall Cognitive Status: No family/caregiver present to determine baseline cognitive functioning                                 General Comments: A&Ox3 self, location, situation        General Comments      Exercises Other Exercises Other Exercises: x2 sit to stands completed CGA with RW from elevated surface, Max A to clean patient while in standing (patient relies on BUE support from RW) Other Exercises: patient educated on role of PT, d/c plans, fall risk   Assessment/Plan    PT Assessment Patient does not need any further PT services  PT Problem List         PT Treatment Interventions      PT Goals (Current goals can be found in the Care Plan section)  Acute Rehab PT Goals PT Goal Formulation: All assessment and education complete, DC therapy Time For Goal Achievement: 05/26/21 Potential to Achieve Goals: Good    Frequency       Co-evaluation               AM-PAC PT "6 Clicks" Mobility  Outcome Measure Help needed turning from your back to your side while in a flat bed without using bedrails?: None Help needed moving from lying on your back to sitting on the side of a flat bed without  using bedrails?: None Help needed moving to and from a bed to a chair (including a wheelchair)?: A Lot Help needed standing up from a chair using your arms (e.g., wheelchair or bedside chair)?: A Little Help needed to walk in hospital room?: A Lot (w/o prostehtic (R)) Help needed climbing 3-5 steps with a railing? : A Lot (w/o R prostehtic) 6 Click Score: 17    End of Session Equipment Utilized During Treatment: Gait belt Activity Tolerance: Patient tolerated treatment well;Patient limited by lethargy Patient left: in bed;with call bell/phone within reach;with bed alarm set Nurse Communication: Mobility status PT Visit Diagnosis: Unsteadiness on feet (R26.81);Muscle weakness (generalized) (M62.81)    Time: [id number] PT Time Calculation (min) (ACUTE ONLY): 21 min   Charges:   PT Evaluation $PT Eval Low Complexity: 1 Low  Iva Boop, PT  05/12/21. 11:57 AM

## 2021-05-13 DIAGNOSIS — A419 Sepsis, unspecified organism: Secondary | ICD-10-CM | POA: Diagnosis not present

## 2021-05-13 DIAGNOSIS — M869 Osteomyelitis, unspecified: Secondary | ICD-10-CM | POA: Diagnosis not present

## 2021-05-13 DIAGNOSIS — I5022 Chronic systolic (congestive) heart failure: Secondary | ICD-10-CM | POA: Diagnosis not present

## 2021-05-13 LAB — GLUCOSE, CAPILLARY
Glucose-Capillary: 118 mg/dL — ABNORMAL HIGH (ref 70–99)
Glucose-Capillary: 152 mg/dL — ABNORMAL HIGH (ref 70–99)
Glucose-Capillary: 163 mg/dL — ABNORMAL HIGH (ref 70–99)
Glucose-Capillary: 181 mg/dL — ABNORMAL HIGH (ref 70–99)
Glucose-Capillary: 183 mg/dL — ABNORMAL HIGH (ref 70–99)
Glucose-Capillary: 195 mg/dL — ABNORMAL HIGH (ref 70–99)

## 2021-05-13 LAB — SURGICAL PATHOLOGY

## 2021-05-13 MED ORDER — OXYCODONE-ACETAMINOPHEN 5-325 MG PO TABS
1.0000 | ORAL_TABLET | ORAL | Status: DC | PRN
  Administered 2021-05-13 – 2021-05-14 (×3): 2 via ORAL
  Filled 2021-05-13 (×3): qty 2

## 2021-05-13 MED ORDER — INSULIN ASPART 100 UNIT/ML IJ SOLN
0.0000 [IU] | Freq: Three times a day (TID) | INTRAMUSCULAR | Status: DC
  Administered 2021-05-13 (×2): 3 [IU] via SUBCUTANEOUS
  Administered 2021-05-14: 2 [IU] via SUBCUTANEOUS
  Administered 2021-05-14: 5 [IU] via SUBCUTANEOUS
  Filled 2021-05-13 (×4): qty 1

## 2021-05-13 MED ORDER — AMOXICILLIN-POT CLAVULANATE 875-125 MG PO TABS
1.0000 | ORAL_TABLET | Freq: Two times a day (BID) | ORAL | Status: DC
  Administered 2021-05-13 – 2021-05-14 (×3): 1 via ORAL
  Filled 2021-05-13 (×3): qty 1

## 2021-05-13 NOTE — Progress Notes (Signed)
Progress Note   Patient: Miguel Harvey [id number] DOB: 07-10-1945 DOA: 05/08/2021     5 DOS: the patient was seen and examined on 05/13/2021   Brief hospital course: Patient admitted with sepsis due to likely osteomyelitis of the left great toe after presenting with fever of 102.9 and leukocytosis.  He had no specific complaints on admission but had fallen at SNF and noted to have generalized weakness.  Podiatry and Infectious Disease are consulted.   Assessment and Plan: * Sepsis (HCC)- (present on admission) Present on admission with fever of 102.9 and leukocytosis 15.8.  Ssource is left great toe osteomyelitis.  Treated per sepsis protocol on admission.  Continue IV antibiotics and follow cultures.  Monitor hemodynamics closely.  Sepsis physiology has resolved.  Foot osteomyelitis, left (HCC)- (present on admission) Acuity is not clear by the x-ray. MRI left foot confirmed acute osteomyelitis. Initially on broad-spectrum IV antibiotics. Transition Unasyn >> Augmentin x 7 days.  Status post left great toe amputation on 2/12. Pain control PRN. Consults: Podiatry, Infectious Disease   Chronic systolic CHF (congestive heart failure) (HCC)- (present on admission) Patient appears euvolemic on admission.  Continue home Lasix and Coreg.  Monitor volume status.  PAD (peripheral artery disease) (HCC)- (present on admission) With prior right BKA, followed by vascular surgery.  Now with left great toe osteomyelitis status post amputation.   Continue aspirin and Plavix.  HTN (hypertension), malignant- (present on admission) Blood pressure labile on admission. Continue home Coreg and Lasix.   Monitor blood pressure.  Type 2 diabetes mellitus (HCC) Uncontrolled with Hbg A1c 9.1%. Hyperglycemic on admission glucose 279.  Patient had normal bicarb and normal anion gap, however beta hydroxybutyrate was checked in the ED and mildly elevated he was therefore treated with IV insulin in the ED.   Patient does not have DKA or HHS however, will resume subcutaneous insulin. -- Continue basal insulin at home dose -- Sliding scale NovoLog  Pressure injury of skin- (present on admission) Present on admission. Frequently reposition patient. Local wound care and close monitoring for signs of infection.          TOBACCO DEPENDENCE- (present on admission) Counseled on importance of cessation.  Positive blood cultures- (present on admission) Admission blood culture grew Staph capitis in the aerobic bottle only.  Contaminant suspected.  Organism isolated from left foot wound culture growing few group G strep.  Continue antibiotics per ID.  Hyperlipidemia associated with type 2 diabetes mellitus (HCC)- (present on admission) Continue Lipitor        Subjective: Pt awake resting in bed.  His foot pain was uncontrolled overnight and earlier AM but reports improvement with Percocet.  No other acute complaints.  Doesn't feel ready to d/c without pain better controlled.   Physical Exam: Vitals:   05/12/21 2025 05/13/21 0244 05/13/21 0747 05/13/21 1512  BP: 140/67 (!) 143/70 137/69 131/68  Pulse: 72 67 64 62  Resp: 18 18    Temp: 98.9 F (37.2 C) 98.2 F (36.8 C) 97.7 F (36.5 C) 97.6 F (36.4 C)  TempSrc:      SpO2: 98% 100% 95% 100%  Weight:      Height:       General exam: awake, alert, no acute distress HEENT: moist mucus membranes, hearing grossly normal  Respiratory system: CTAB, no wheezes, rales or rhonchi, normal respiratory effort. Cardiovascular system: normal S1/S2, RRRSM felt, +bowel sounds. Central nervous system: A&O x3. no gross focal neurologic deficits, normal speech Extremities: R BKA, left foot  with gauze dressing, no edema, normal tone Psychiatry: normal mood, congruent affect, judgement and insight appear normal   Data Reviewed:  No new labs.  CBG's at goal  Family Communication: none  Disposition: Status is: Inpatient Remains inpatient  appropriate because: Requires d/c to SNF pending adequate pain control.          Planned Discharge Destination: Skilled nursing facility     Time spent: 35 minutes  Author: Pennie Banter, DO 05/13/2021 5:16 PM  For on call review [email or web address].

## 2021-05-13 NOTE — Progress Notes (Signed)
Dr. Denton Lank notified of patient's pain being an 8 or 9 all day yesterday and all night. Received new orders for roxicodone.

## 2021-05-14 DIAGNOSIS — E785 Hyperlipidemia, unspecified: Secondary | ICD-10-CM

## 2021-05-14 DIAGNOSIS — E118 Type 2 diabetes mellitus with unspecified complications: Secondary | ICD-10-CM

## 2021-05-14 DIAGNOSIS — R6510 Systemic inflammatory response syndrome (SIRS) of non-infectious origin without acute organ dysfunction: Secondary | ICD-10-CM

## 2021-05-14 DIAGNOSIS — W19XXXA Unspecified fall, initial encounter: Secondary | ICD-10-CM

## 2021-05-14 DIAGNOSIS — Z794 Long term (current) use of insulin: Secondary | ICD-10-CM

## 2021-05-14 LAB — GLUCOSE, CAPILLARY
Glucose-Capillary: 112 mg/dL — ABNORMAL HIGH (ref 70–99)
Glucose-Capillary: 145 mg/dL — ABNORMAL HIGH (ref 70–99)
Glucose-Capillary: 215 mg/dL — ABNORMAL HIGH (ref 70–99)

## 2021-05-14 LAB — RESP PANEL BY RT-PCR (FLU A&B, COVID) ARPGX2
Influenza A by PCR: NEGATIVE
Influenza B by PCR: NEGATIVE
SARS Coronavirus 2 by RT PCR: NEGATIVE

## 2021-05-14 MED ORDER — OXYCODONE-ACETAMINOPHEN 5-325 MG PO TABS: 1.0000 | ORAL_TABLET | Freq: Three times a day (TID) | ORAL | 0 refills | Status: DC | PRN

## 2021-05-14 MED ORDER — ACETAMINOPHEN 325 MG PO TABS: 650.0000 mg | ORAL_TABLET | Freq: Four times a day (QID) | ORAL | Status: DC | PRN

## 2021-05-14 MED ORDER — AMOXICILLIN-POT CLAVULANATE 875-125 MG PO TABS: 1.0000 | ORAL_TABLET | Freq: Two times a day (BID) | ORAL | 0 refills | Status: AC

## 2021-05-14 MED ORDER — SENNA 8.6 MG PO TABS: 1.0000 | ORAL_TABLET | Freq: Every day | ORAL | 0 refills | Status: DC | PRN

## 2021-05-14 NOTE — Plan of Care (Signed)
  Problem: Health Behavior/Discharge Planning: Goal: Ability to manage health-related needs will improve Outcome: Progressing   Problem: Clinical Measurements: Goal: Ability to maintain clinical measurements within normal limits will improve Outcome: Progressing   

## 2021-05-14 NOTE — TOC Transition Note (Signed)
Transition of Care [facility]) - CM/SW Discharge Note   Patient Details  Name: EWIN REHBERG MRN: [id number] Date of Birth: Mar 26, 1946  [facility]) CM/SW Contact:  Chapman Fitch, RN Phone Number: 05/14/2021, 1:29 PM   Clinical Narrative:      Patient will DC to: [facility]  Anticipated DC date:05/14/21  Family notified:Patient declines for me to call family  Transport by: EMS  Per MD patient ready for DC to . RN, patient, and facility notified of DC. Discharge Summary sent to facility. RN given number for report. DC packet on chart. Ambulance transport requested for patient.  TOC signing off.  Bevelyn Ngo [facility] [phone]    Barriers to Discharge: Continued Medical Work up   Patient Goals and CMS Choice     Choice offered to / list presented to : NA  Discharge Placement                       Discharge Plan and Services     Post Acute Care Choice: Resumption of Svcs/PTA Provider                               Social Determinants of Health (SDOH) Interventions     Readmission Risk Interventions No flowsheet data found.

## 2021-05-14 NOTE — Discharge Summary (Signed)
Physician Discharge Summary  UBAID COULIBALY LDJ:[id number] DOB: [birth year] DOA: 05/08/2021  PCP: [name], MD  Admit date: 05/08/2021 Discharge date: 05/14/2021  Admitted From: SNF Disposition: Skilled nursing facility  Recommendations for Outpatient Follow-up:  Follow up with PCP within 1-2 weeks Follow up with podiatry in 1 week. Their wound care recommendations from 2/13 are as follows: -Redressed today with Xeroform to the incision line followed by 4 x 4 gauze, ABD, Kerlix, Ace wrap.  Patient to keep dressings clean, dry, and intact for the next week until postoperative visit in outpatient clinic. -Patient may be partial weightbearing with heel contact and surgical shoe for transfers until incision heals.  Will likely take 2 to 3 weeks for the incision to heal but could take longer due to patient's uncontrolled diabetes and history of PVD. -Wound culture growing strep bacteria sensitive to penicillin.  Recommend discharge on 7-day course of Augmentin.  Discharge Condition:stable CODE STATUS:  Code Status: Full Code  Regular healthy diet  Brief/Interim Summary:  Sepsis (HCC)- (present on admission) Present on admission with fever of 102.9 and leukocytosis 15.8.  Source is left great toe osteomyelitis. Treated per sepsis protocol on admission.  S/p amputation 2/12. Continue augmentin until 2/18, per ID recommendations.    DM- patient was treated with sliding scale insulin inpatient as blood sugars can be labile with acute infection.   All other chronic conditions were treated with home medications.   Discharge Diagnoses:  Principal Problem:   Sepsis (HCC) Active Problems:   TOBACCO DEPENDENCE   Type 2 diabetes mellitus (HCC)   HTN (hypertension), malignant   Hyperlipidemia associated with type 2 diabetes mellitus (HCC)   Pressure injury of skin   Foot osteomyelitis, left (HCC)   PAD (peripheral artery disease) (HCC)   Chronic systolic CHF (congestive heart failure) (HCC)    Positive blood cultures    Allergies as of 05/14/2021       Reactions   Spironolactone    Hyperkalemia (currently takes spironolactone)        Medication List     STOP taking these medications    Align 4 MG Caps   ciclopirox 0.77 % cream Commonly known as: LOPROX       TAKE these medications    acetaminophen 325 MG tablet Commonly known as: TYLENOL Take 2 tablets (650 mg total) by mouth every 6 (six) hours as needed for mild pain (or Fever >/= 101).   amoxicillin-clavulanate 875-125 MG tablet Commonly known as: AUGMENTIN Take 1 tablet by mouth every 12 (twelve) hours for 3 days.   aspirin 81 MG EC tablet Take 1 tablet (81 mg total) by mouth daily.   atorvastatin 20 MG tablet Commonly known as: LIPITOR Take 20 mg by mouth every evening.   carvedilol 6.25 MG tablet Commonly known as: COREG Take 6.25 mg by mouth 2 (two) times daily with a meal.   clopidogrel 75 MG tablet Commonly known as: PLAVIX Take 1 tablet (75 mg total) by mouth daily.   furosemide 20 MG tablet Commonly known as: LASIX Take 20 mg by mouth daily.   insulin lispro 100 UNIT/ML KwikPen Commonly known as: HUMALOG Inject 5 Units into the skin 3 (three) times daily. HOLD IF CBG < or = 150   Lantus SoloStar 100 UNIT/ML Solostar Pen Generic drug: insulin glargine Inject 8 Units into the skin daily.   linagliptin 5 MG Tabs tablet Commonly known as: TRADJENTA Take 5 mg by mouth daily.   Magnesium 400 MG Tabs Take  400 mg by mouth 2 (two) times daily.   metFORMIN 500 MG tablet Commonly known as: GLUCOPHAGE Take 500 mg by mouth 2 (two) times daily with a meal.   multivitamin with minerals Tabs tablet Take 1 tablet by mouth daily.   oxyCODONE-acetaminophen 5-325 MG tablet Commonly known as: PERCOCET/ROXICET Take 1 tablet by mouth every 8 (eight) hours as needed for moderate pain or severe pain.   senna 8.6 MG Tabs tablet Commonly known as: SENOKOT Take 1 tablet (8.6 mg total) by  mouth daily as needed for mild constipation.   sertraline 25 MG tablet Commonly known as: ZOLOFT Take 25 mg by mouth daily.   tamsulosin 0.4 MG Caps capsule Commonly known as: FLOMAX Take 1 capsule (0.4 mg total) by mouth daily.   Trulicity 1.5 0000000 Sopn Generic drug: Dulaglutide Inject 1.5 mg into the skin every Friday.        Follow-up Information     Samara Deist, DPM. Schedule an appointment as soon as possible for a visit in 1 week(s).   Specialty: Podiatry Contact information: [place] [address] [phone]                Allergies  Allergen Reactions   Spironolactone     Hyperkalemia (currently takes spironolactone)    Consultations: Podiatry ID  Procedures/Studies: DG Chest 2 View  Result Date: 05/08/2021 CLINICAL DATA:  Fever EXAM: CHEST - 2 VIEW COMPARISON:  Chest two views 11/17/2020 FINDINGS: Cardiac silhouette and mediastinal contours are within normal limits. Mild bibasilar bronchovascular crowding. Mildly decreased lung volumes. No focal airspace opacity to indicate pneumonia. No pleural effusion or pneumothorax. Moderate multilevel degenerative disc changes of the thoracic spine. IMPRESSION: Mildly decreased lung volumes with mild subsegmental atelectasis. No definite radiographic evidence of pneumonia. Electronically Signed   By: Yvonne Kendall M.D.   On: 05/08/2021 13:11   CT HEAD WO CONTRAST (5MM)  Result Date: 05/08/2021 CLINICAL DATA:  Provided history: Head trauma, minor. Neck trauma. Additional history provided by scanning technologist: Multiple falls. Patient takes Plavix. EXAM: CT HEAD WITHOUT CONTRAST CT CERVICAL SPINE WITHOUT CONTRAST TECHNIQUE: Multidetector CT imaging of the head and cervical spine was performed following the standard protocol without intravenous contrast. Multiplanar CT image reconstructions of the cervical spine were also generated. RADIATION DOSE REDUCTION: This exam was performed  according to the departmental dose-optimization program which includes automated exposure control, adjustment of the mA and/or kV according to patient size and/or use of iterative reconstruction technique. COMPARISON:  Prior head CT examinations 08/06/2017 and earlier. Brain MRI 07/02/2017. FINDINGS: CT HEAD FINDINGS Brain: Mild generalized cerebral atrophy. Mild patchy and ill-defined hypoattenuation within the cerebral white matter, nonspecific but compatible chronic small vessel ischemic disease. There is no acute intracranial hemorrhage. No demarcated cortical infarct. No extra-axial fluid collection. No evidence of an intracranial mass. No midline shift. Vascular: No hyperdense vessel.  Atherosclerotic calcifications. Skull: Normal. Negative for fracture or focal lesion. Sinuses/Orbits: Visualized orbits show no acute finding. Mild mucosal thickening within the bilateral ethmoid sinuses. CT CERVICAL SPINE FINDINGS Alignment: Reversal of the expected cervical lordosis. No significant spondylolisthesis. Skull base and vertebrae: The basion-dental and atlanto-dental intervals are maintained.No evidence of acute fracture to the cervical spine. Soft tissues and spinal canal: No prevertebral fluid or swelling. No visible canal hematoma. Disc levels: Cervical spondylosis with multilevel disc space narrowing, disc bulges/central disc protrusions, posterior disc osteophytes and uncovertebral hypertrophy. Multilevel spinal canal stenosis. Most notably, there is apparent at least moderate spinal canal  stenosis at C3-C4, C4-C5, C5-C6 and C6-C7. Multilevel bony neural foraminal narrowing. Multilevel ventral osteophytes, most prominent at C3-C4. Upper chest: No consolidation within the imaged lung apices. No visible pneumothorax. Centrilobular emphysema. IMPRESSION: CT head: 1. No evidence of acute intracranial abnormality. 2. Mild chronic small vessel ischemic changes within the cerebral white matter. 3. Mild generalized  cerebral atrophy. CT cervical spine: 1. No evidence of acute fracture to the cervical spine. 2. Nonspecific reversal of the expected cervical lordosis. 3. Cervical spondylosis, as outlined. Multilevel spinal canal stenosis. Most notably, there is suspected at least moderate spinal canal stenosis at C3-C4, C4-C5, C5-C6 and C6-C7. Multilevel bony neural foraminal narrowing. 4.  Emphysema (ICD10-J43.9). Electronically Signed   By: Kellie Simmering D.O.   On: 05/08/2021 14:22   CT Cervical Spine Wo Contrast  Result Date: 05/08/2021 CLINICAL DATA:  Provided history: Head trauma, minor. Neck trauma. Additional history provided by scanning technologist: Multiple falls. Patient takes Plavix. EXAM: CT HEAD WITHOUT CONTRAST CT CERVICAL SPINE WITHOUT CONTRAST TECHNIQUE: Multidetector CT imaging of the head and cervical spine was performed following the standard protocol without intravenous contrast. Multiplanar CT image reconstructions of the cervical spine were also generated. RADIATION DOSE REDUCTION: This exam was performed according to the departmental dose-optimization program which includes automated exposure control, adjustment of the mA and/or kV according to patient size and/or use of iterative reconstruction technique. COMPARISON:  Prior head CT examinations 08/06/2017 and earlier. Brain MRI 07/02/2017. FINDINGS: CT HEAD FINDINGS Brain: Mild generalized cerebral atrophy. Mild patchy and ill-defined hypoattenuation within the cerebral white matter, nonspecific but compatible chronic small vessel ischemic disease. There is no acute intracranial hemorrhage. No demarcated cortical infarct. No extra-axial fluid collection. No evidence of an intracranial mass. No midline shift. Vascular: No hyperdense vessel.  Atherosclerotic calcifications. Skull: Normal. Negative for fracture or focal lesion. Sinuses/Orbits: Visualized orbits show no acute finding. Mild mucosal thickening within the bilateral ethmoid sinuses. CT CERVICAL  SPINE FINDINGS Alignment: Reversal of the expected cervical lordosis. No significant spondylolisthesis. Skull base and vertebrae: The basion-dental and atlanto-dental intervals are maintained.No evidence of acute fracture to the cervical spine. Soft tissues and spinal canal: No prevertebral fluid or swelling. No visible canal hematoma. Disc levels: Cervical spondylosis with multilevel disc space narrowing, disc bulges/central disc protrusions, posterior disc osteophytes and uncovertebral hypertrophy. Multilevel spinal canal stenosis. Most notably, there is apparent at least moderate spinal canal stenosis at C3-C4, C4-C5, C5-C6 and C6-C7. Multilevel bony neural foraminal narrowing. Multilevel ventral osteophytes, most prominent at C3-C4. Upper chest: No consolidation within the imaged lung apices. No visible pneumothorax. Centrilobular emphysema. IMPRESSION: CT head: 1. No evidence of acute intracranial abnormality. 2. Mild chronic small vessel ischemic changes within the cerebral white matter. 3. Mild generalized cerebral atrophy. CT cervical spine: 1. No evidence of acute fracture to the cervical spine. 2. Nonspecific reversal of the expected cervical lordosis. 3. Cervical spondylosis, as outlined. Multilevel spinal canal stenosis. Most notably, there is suspected at least moderate spinal canal stenosis at C3-C4, C4-C5, C5-C6 and C6-C7. Multilevel bony neural foraminal narrowing. 4.  Emphysema (ICD10-J43.9). Electronically Signed   By: Kellie Simmering D.O.   On: 05/08/2021 14:22   MR FOOT LEFT WO CONTRAST  Result Date: 05/09/2021 CLINICAL DATA:  Foot swelling, diabetic, osteomyelitis suspected, xray done EXAM: MRI OF THE LEFT FOOT WITHOUT CONTRAST TECHNIQUE: Multiplanar, multisequence MR imaging of the left forefoot was performed. No intravenous contrast was administered. COMPARISON:  X-ray 05/08/2021 FINDINGS: Bones/Joint/Cartilage Postsurgical changes of prior transmetatarsal amputation of the second  through  fifth rays. No signal abnormality at the resection margins. There is cortical erosion involving the tuft of the great toe distal phalanx with extensive bone marrow edema and confluent low T1 signal compatible with acute osteomyelitis. Preservation of the normal bone marrow signal throughout the proximal phalanx of the great toe. No acute fracture. No dislocation. No additional sites of bone marrow edema or marrow replacement. Ligaments Intact Lisfranc ligament. Collateral ligaments of the great toe are intact. Muscles and Tendons Amputation changes of the musculotendinous structures. Diffuse fatty infiltration of the foot musculature. Increased T2 signal within the foot musculature may be related to denervation or myositis. No intramuscular fluid collection. Soft tissues Superficial wound or ulceration overlying the distal phalanx of the great toe. There is subcutaneous edema across the dorsum of the forefoot. No organized fluid collection. IMPRESSION: 1. Acute osteomyelitis of the left great toe distal phalanx. 2. 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 No abnormal signal changes at the resection margins. 3. Chronic denervation changes with possible myositis of the foot musculature. Electronically Signed   By: Davina Poke D.O.   On: 05/09/2021 13:44   DG Foot Complete Left  Result Date: 05/08/2021 CLINICAL DATA:  Sepsis EXAM: LEFT FOOT - COMPLETE 3+ VIEW COMPARISON:  08/05/2017 FINDINGS: Patient is status post transmetatarsal amputation of the second through fifth digits. Mild degenerative change at the first MTP joint. Mild erosive change at the tuft of first distal phalanx with some changes present on the 2019 comparison study but potentially increased on today's exam. No soft tissue emphysema IMPRESSION: 1. Mild age indeterminate erosive changes at the tuft of the first distal phalanx, correlate for signs of active infection here 2. 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 Electronically Signed   By: Donavan Foil M.D.   On: 05/08/2021 17:05   VAS [place] ABI WITH/WO TBI  Result Date: 04/23/2021  LOWER EXTREMITY DOPPLER STUDY Patient Name:  EULON LANMAN  Date of Exam:   04/17/2021 Medical Rec #: [id number]      Accession #:    [id number] Date of Birth: 16-Jan-1946     Patient Gender: M Patient Age:   1 years Exam Location:  [facility] Procedure:      VAS [place] ABI WITH/WO TBI Referring Phys: --------------------------------------------------------------------------------  Indications: Peripheral artery disease, and Right BKA.  Performing Technologist: Concha Norway RVT  Examination Guidelines: A complete evaluation includes at minimum, Doppler waveform signals and systolic blood pressure reading at the level of bilateral brachial, anterior tibial, and posterior tibial arteries, when vessel segments are accessible. Bilateral testing is considered an integral part of a complete examination. Photoelectric Plethysmograph (PPG) waveforms and toe systolic pressure readings are included as required and additional duplex testing as needed. Limited examinations for reoccurring indications may be performed as noted.  ABI Findings: +--------+------------------+-----+--------+--------+  Right    Rt Pressure (mmHg) Index Waveform Comment   +--------+------------------+-----+--------+--------+  Brachial 140                                         +--------+------------------+-----+--------+--------+ +---------+------------------+-----+----------+-------+  Left      Lt Pressure (mmHg) Index Waveform   Comment  +---------+------------------+-----+----------+-------+  ATA       145  monophasic          +---------+------------------+-----+----------+-------+  PTA       144                1.03  biphasic            +---------+------------------+-----+----------+-------+  Great Toe 116                0.83  Normal               +---------+------------------+-----+----------+-------+ +-------+-----------+-----------+------------+------------+  ABI/TBI Today's ABI Today's TBI Previous ABI Previous TBI  +-------+-----------+-----------+------------+------------+  Left    1.03        .82         1.01         .91           +-------+-----------+-----------+------------+------------+ Left ABIs and TBIs appear essentially unchanged compared to prior study on 07/10/2020.  Summary: Right: Rt BKA. Left: Resting left ankle-brachial index is within normal range. No evidence of significant left lower extremity arterial disease. The left toe-brachial index is normal.  *See table(s) above for measurements and observations.  Electronically signed by Leotis Pain MD on 04/23/2021 at 10:49:32 AM.    Final    [place] EKG SITE RITE  Result Date: 05/12/2021 If Site Rite image not attached, placement could not be confirmed due to current cardiac rhythm.   Subjective: Patient states that he feels overall well today. His pain is well controlled.   Discharge Exam: Vitals:   05/14/21 0409 05/14/21 0719  BP: (!) 151/88 132/61  Pulse: 68 63  Resp: 18   Temp: (!) 97.5 F (36.4 C) 97.9 F (36.6 C)  SpO2: 95% 95%    General: Pt is alert, awake, not in acute distress Cardiovascular: RRR, S1/S2 +, no rubs, no gallops Respiratory: CTA bilaterally, no wheezing, no rhonchi Abdominal: Soft, NT, ND, bowel sounds + Extremities: no edema, no cyanosis. Wound is has clean and dry dressing. No signs of superficial infection.  Labs: Basic Metabolic Panel: Recent Labs  Lab 05/08/21 1230 05/08/21 1851 05/09/21 0527 05/10/21 0322  NA 130*  --  133* 135  K 4.3  --  3.6 3.7  CL 100  --  104 104  CO2 22  --  22 22  GLUCOSE 279*  --  116* 141*  BUN 17  --  16 15  CREATININE 1.23 1.31* 1.18 1.09  CALCIUM 8.1*  --  7.8* 8.2*   CBC: Recent Labs  Lab 05/08/21 1230 05/09/21 0527 05/10/21 0322  WBC 15.8* 11.4* 10.5  NEUTROABS 14.3*  --   --   HGB  11.2* 9.7* 9.9*  HCT 34.5* 29.7* 30.9*  MCV 83.5 84.6 84.2  PLT 302 239 261    Microbiology Recent Results (from the past 240 hour(s))  Blood Culture (routine x 2)     Status: Abnormal   Collection Time: 05/08/21 12:30 PM   Specimen: BLOOD  Result Value Ref Range Status   Specimen Description   Final    BLOOD BLOOD RIGHT FOREARM Performed at [facility], [address], [place], [place] [address]    Special Requests   Final    BOTTLES DRAWN AEROBIC AND ANAEROBIC Blood Culture adequate volume Performed at [facility], [address], [place], [place] [address]    Culture  Setup Time   Final    GRAM POSITIVE COCCI AEROBIC BOTTLE ONLY CRITICAL VALUE NOTED.  VALUE IS CONSISTENT WITH PREVIOUSLY REPORTED AND CALLED VALUE. Lonell Grandchild  STAIN REVIEWED-AGREE WITH RESULT    Culture (A)  Final    STAPHYLOCOCCUS CAPITIS SUSCEPTIBILITIES PERFORMED ON PREVIOUS CULTURE WITHIN THE LAST 5 DAYS. Performed at [facility], [place] [address], [place], [place] [address]    Report Status 05/12/2021 FINAL  Final  Blood Culture (routine x 2)     Status: Abnormal   Collection Time: 05/08/21 12:30 PM   Specimen: BLOOD  Result Value Ref Range Status   Specimen Description   Final    BLOOD RIGHT ANTECUBITAL Performed at [facility], [address], [place], [place] [address]    Special Requests   Final    BOTTLES DRAWN AEROBIC AND ANAEROBIC Blood Culture adequate volume Performed at [facility], [address], [place], [place] [address]    Culture  Setup Time   Final    GRAM POSITIVE COCCI AEROBIC BOTTLE ONLY CRITICAL RESULT CALLED TO, READ BACK BY AND VERIFIED WITH: Nancy Fetter, PHARMD AT [id number] ON 05/09/21 BY GM Performed at [facility], [place] [address], [place], [place] [address]    Culture STAPHYLOCOCCUS CAPITIS (A)  Final   Report Status 05/12/2021 FINAL  Final   Organism ID, Bacteria STAPHYLOCOCCUS CAPITIS  Final      Susceptibility    Staphylococcus capitis - MIC*    CIPROFLOXACIN <=0.5 SENSITIVE Sensitive     ERYTHROMYCIN <=0.25 SENSITIVE Sensitive     GENTAMICIN <=0.5 SENSITIVE Sensitive     OXACILLIN <=0.25 SENSITIVE Sensitive     TETRACYCLINE <=1 SENSITIVE Sensitive     VANCOMYCIN <=0.5 SENSITIVE Sensitive     TRIMETH/SULFA <=10 SENSITIVE Sensitive     CLINDAMYCIN <=0.25 SENSITIVE Sensitive     RIFAMPIN <=0.5 SENSITIVE Sensitive     Inducible Clindamycin NEGATIVE Sensitive     * STAPHYLOCOCCUS CAPITIS  Blood Culture ID Panel (Reflexed)     Status: Abnormal   Collection Time: 05/08/21 12:30 PM  Result Value Ref Range Status   Enterococcus faecalis NOT DETECTED NOT DETECTED Final   Enterococcus Faecium NOT DETECTED NOT DETECTED Final   Listeria monocytogenes NOT DETECTED NOT DETECTED Final   Staphylococcus species DETECTED (A) NOT DETECTED Final    Comment: CRITICAL RESULT CALLED TO, READ BACK BY AND VERIFIED WITH: SHEEMA HALLAJI, PHARMD AT 1122 ON 05/09/21 BY GM    Staphylococcus aureus (BCID) NOT DETECTED NOT DETECTED Final   Staphylococcus epidermidis NOT DETECTED NOT DETECTED Final   Staphylococcus lugdunensis NOT DETECTED NOT DETECTED Final   Streptococcus species NOT DETECTED NOT DETECTED Final   Streptococcus agalactiae NOT DETECTED NOT DETECTED Final   Streptococcus pneumoniae NOT DETECTED NOT DETECTED Final   Streptococcus pyogenes NOT DETECTED NOT DETECTED Final   A.calcoaceticus-baumannii NOT DETECTED NOT DETECTED Final   Bacteroides fragilis NOT DETECTED NOT DETECTED Final   Enterobacterales NOT DETECTED NOT DETECTED Final   Enterobacter cloacae complex NOT DETECTED NOT DETECTED Final   Escherichia coli NOT DETECTED NOT DETECTED Final   Klebsiella aerogenes NOT DETECTED NOT DETECTED Final   Klebsiella oxytoca NOT DETECTED NOT DETECTED Final   Klebsiella pneumoniae NOT DETECTED NOT DETECTED Final   Proteus species NOT DETECTED NOT DETECTED Final   Salmonella species NOT DETECTED NOT DETECTED Final    Serratia marcescens NOT DETECTED NOT DETECTED Final   Haemophilus influenzae NOT DETECTED NOT DETECTED Final   Neisseria meningitidis NOT DETECTED NOT DETECTED Final   Pseudomonas aeruginosa NOT DETECTED NOT DETECTED Final   Stenotrophomonas maltophilia NOT DETECTED NOT DETECTED Final   Candida albicans NOT DETECTED  NOT DETECTED Final   Candida auris NOT DETECTED NOT DETECTED Final   Candida glabrata NOT DETECTED NOT DETECTED Final   Candida krusei NOT DETECTED NOT DETECTED Final   Candida parapsilosis NOT DETECTED NOT DETECTED Final   Candida tropicalis NOT DETECTED NOT DETECTED Final   Cryptococcus neoformans/gattii NOT DETECTED NOT DETECTED Final    Comment: Performed at [facility], [address] [place], [place] [address]  Resp Panel by RT-PCR (Flu A&B, Covid) Nasopharyngeal Swab     Status: None   Collection Time: 05/08/21  2:40 PM   Specimen: Nasopharyngeal Swab; Nasopharyngeal(NP) swabs in vial transport medium  Result Value Ref Range Status   SARS Coronavirus 2 by RT PCR NEGATIVE NEGATIVE Final    Comment: (NOTE) SARS-CoV-2 target nucleic acids are NOT DETECTED.  The SARS-CoV-2 RNA is generally detectable in upper respiratory specimens during the acute phase of infection. The lowest concentration of SARS-CoV-2 viral copies this assay can detect is 138 copies/mL. A negative result does not preclude SARS-Cov-2 infection and should not be used as the sole basis for treatment or other patient management decisions. A negative result may occur with  improper specimen collection/handling, submission of specimen other than nasopharyngeal swab, presence of viral mutation(s) within the areas targeted by this assay, and inadequate number of viral copies(<138 copies/mL). A negative result must be combined with clinical observations, patient history, and epidemiological information. The expected result is Negative.  Fact Sheet for Patients:   EntrepreneurPulse.com.au  Fact Sheet for Healthcare Providers:  IncredibleEmployment.be  This test is no t yet approved or cleared by the [place] FDA and  has been authorized for detection and/or diagnosis of SARS-CoV-2 by FDA under an Emergency Use Authorization (EUA). This EUA will remain  in effect (meaning this test can be used) for the duration of the COVID-19 declaration under Section 564(b)(1) of the Act, 21 U.S.C.section 360bbb-3(b)(1), unless the authorization is terminated  or revoked sooner.       Influenza A by PCR NEGATIVE NEGATIVE Final   Influenza B by PCR NEGATIVE NEGATIVE Final    Comment: (NOTE) The Xpert Xpress SARS-CoV-2/FLU/RSV plus assay is intended as an aid in the diagnosis of influenza from Nasopharyngeal swab specimens and should not be used as a sole basis for treatment. Nasal washings and aspirates are unacceptable for Xpert Xpress SARS-CoV-2/FLU/RSV testing.  Fact Sheet for Patients: EntrepreneurPulse.com.au  Fact Sheet for Healthcare Providers: IncredibleEmployment.be  This test is not yet approved or cleared by the [place] FDA and has been authorized for detection and/or diagnosis of SARS-CoV-2 by FDA under an Emergency Use Authorization (EUA). This EUA will remain in effect (meaning this test can be used) for the duration of the COVID-19 declaration under Section 564(b)(1) of the Act, 21 U.S.C. section 360bbb-3(b)(1), unless the authorization is terminated or revoked.  Performed at [facility], [address] [place], [place] [address]   MRSA Next Gen by PCR, Nasal     Status: None   Collection Time: 05/09/21  3:59 AM   Specimen: Nasal Mucosa; Nasal Swab  Result Value Ref Range Status   MRSA by PCR Next Gen NOT DETECTED NOT DETECTED Final    Comment: (NOTE) The GeneXpert MRSA Assay (FDA approved for NASAL specimens only), is one component of a  comprehensive MRSA colonization surveillance program. It is not intended to diagnose MRSA infection nor to guide or monitor treatment for MRSA infections. Test performance is not FDA approved in patients less than 15 years old.  Performed at [facility], [address] [place], [place] [address]   Aerobic/Anaerobic Culture w Gram Stain (surgical/deep wound)     Status: None (Preliminary result)   Collection Time: 05/09/21 12:04 PM   Specimen: Foot; Abscess  Result Value Ref Range Status   Specimen Description   Final    FOOT LEFT Performed at [facility], [address], [place], [place] [address]    Special Requests   Final    NONE Performed at [facility], [place], [place] [address]    Gram Stain   Final    RARE WBC PRESENT,BOTH PMN AND MONONUCLEAR FEW GRAM POSITIVE COCCI Performed at [facility], [place] [address] [place], [place] [address]    Culture   Final    FEW STREPTOCOCCUS GROUP G Beta hemolytic streptococci are predictably susceptible to penicillin and other beta lactams. Susceptibility testing not routinely performed. NO ANAEROBES ISOLATED; CULTURE IN PROGRESS FOR 5 DAYS    Report Status PENDING  Incomplete  Surgical PCR screen     Status: Abnormal   Collection Time: 05/11/21  4:48 AM   Specimen: Nasal Mucosa; Nasal Swab  Result Value Ref Range Status   MRSA, PCR NEGATIVE NEGATIVE Final   Staphylococcus aureus POSITIVE (A) NEGATIVE Final    Comment: (NOTE) The Xpert SA Assay (FDA approved for NASAL specimens in patients 42 years of age and older), is one component of a comprehensive surveillance program. It is not intended to diagnose infection nor to guide or monitor treatment. Performed at [facility], [address] [place], [place] [address]   Aerobic/Anaerobic Culture w Gram Stain (surgical/deep wound)     Status: None (Preliminary result)   Collection Time: 05/11/21  8:30 AM   Specimen:  PATH Digit amputation; Tissue  Result Value Ref Range Status   Specimen Description   Final    TOE LEFT GREAT Performed at [facility], [address] [place], [place] [address]    Special Requests   Final    NONE Performed at [facility], [place], [place] [address]    Gram Stain   Final    RARE WBC PRESENT, PREDOMINANTLY PMN RARE SQUAMOUS EPITHELIAL CELLS PRESENT RARE GRAM POSITIVE COCCI Performed at [facility], [place] [address] [place], [place] [address]    Culture   Final    FEW STREPTOCOCCUS GROUP C RARE PROTEUS MIRABILIS SUSCEPTIBILITIES TO FOLLOW NO ANAEROBES ISOLATED; CULTURE IN PROGRESS FOR 5 DAYS    Report Status PENDING  Incomplete  Resp Panel by RT-PCR (Flu A&B, Covid) Nasopharyngeal Swab     Status: None   Collection Time: 05/14/21  1:20 AM   Specimen: Nasopharyngeal Swab; Nasopharyngeal(NP) swabs in vial transport medium  Result Value Ref Range Status   SARS Coronavirus 2 by RT PCR NEGATIVE NEGATIVE Final    Comment: (NOTE) SARS-CoV-2 target nucleic acids are NOT DETECTED.  The SARS-CoV-2 RNA is generally detectable in upper respiratory specimens during the acute phase of infection. The lowest concentration of SARS-CoV-2 viral copies this assay can detect is 138 copies/mL. A negative result does not preclude SARS-Cov-2 infection and should not be used as the sole basis for treatment or other patient management decisions. A negative result may occur with  improper specimen collection/handling, submission of specimen other than nasopharyngeal swab, presence of viral mutation(s) within the areas targeted by this assay, and inadequate number of viral copies(<138 copies/mL). A negative result must be  combined with clinical observations, patient history, and epidemiological information. The expected result is Negative.  Fact Sheet for Patients:  EntrepreneurPulse.com.au  Fact Sheet for Healthcare  Providers:  IncredibleEmployment.be  This test is no t yet approved or cleared by the [place] FDA and  has been authorized for detection and/or diagnosis of SARS-CoV-2 by FDA under an Emergency Use Authorization (EUA). This EUA will remain  in effect (meaning this test can be used) for the duration of the COVID-19 declaration under Section 564(b)(1) of the Act, 21 U.S.C.section 360bbb-3(b)(1), unless the authorization is terminated  or revoked sooner.       Influenza A by PCR NEGATIVE NEGATIVE Final   Influenza B by PCR NEGATIVE NEGATIVE Final    Comment: (NOTE) The Xpert Xpress SARS-CoV-2/FLU/RSV plus assay is intended as an aid in the diagnosis of influenza from Nasopharyngeal swab specimens and should not be used as a sole basis for treatment. Nasal washings and aspirates are unacceptable for Xpert Xpress SARS-CoV-2/FLU/RSV testing.  Fact Sheet for Patients: EntrepreneurPulse.com.au  Fact Sheet for Healthcare Providers: IncredibleEmployment.be  This test is not yet approved or cleared by the [place] FDA and has been authorized for detection and/or diagnosis of SARS-CoV-2 by FDA under an Emergency Use Authorization (EUA). This EUA will remain in effect (meaning this test can be used) for the duration of the COVID-19 declaration under Section 564(b)(1) of the Act, 21 U.S.C. section 360bbb-3(b)(1), unless the authorization is terminated or revoked.  Performed at [facility], [address] [place], [place] [address]     Time coordinating discharge: Over 30 minutes  Richarda Osmond, MD  [facility] 05/14/2021, 12:37 PM

## 2021-05-14 NOTE — Progress Notes (Incomplete)
Progress Note   Patient: Miguel Harvey KVQ:[id number] DOB: 08/14/1945 DOA: 05/08/2021     6 DOS: the patient was seen and examined on 05/14/2021   Brief hospital course: Patient admitted with sepsis due to likely osteomyelitis of the left great toe after presenting with fever of 102.9 and leukocytosis.  He had no specific complaints on admission but had fallen at SNF and noted to have generalized weakness.  Podiatry and Infectious Disease are consulted.   Assessment and Plan: * Sepsis (HCC)- (present on admission) Present on admission with fever of 102.9 and leukocytosis 15.8.  Ssource is left great toe osteomyelitis.  Treated per sepsis protocol on admission.  Continue IV antibiotics and follow cultures.  Monitor hemodynamics closely.  Sepsis physiology has resolved.  Positive blood cultures- (present on admission) Admission blood culture grew Staph capitis in the aerobic bottle only.  Contaminant suspected.  Organism isolated from left foot wound culture growing few group G strep.  Continue antibiotics per ID.  Chronic systolic CHF (congestive heart failure) (HCC)- (present on admission) Patient appears euvolemic on admission.  Continue home Lasix and Coreg.  Monitor volume status.  PAD (peripheral artery disease) (HCC)- (present on admission) With prior right BKA, followed by vascular surgery.  Now with left great toe osteomyelitis status post amputation.   Continue aspirin and Plavix.  Foot osteomyelitis, left (HCC)- (present on admission) Acuity is not clear by the x-ray. MRI left foot confirmed acute osteomyelitis. Initially on broad-spectrum IV antibiotics. Transition Unasyn >> Augmentin x 7 days.  Status post left great toe amputation on 2/12. Pain control PRN. Consults: Podiatry, Infectious Disease   Pressure injury of skin- (present on admission) Present on admission. Frequently reposition patient. Local wound care and close monitoring for signs of infection.           Hyperlipidemia associated with type 2 diabetes mellitus (HCC)- (present on admission) Continue Lipitor  HTN (hypertension), malignant- (present on admission) Blood pressure labile on admission. Continue home Coreg and Lasix.   Monitor blood pressure.  Type 2 diabetes mellitus (HCC) Uncontrolled with Hbg A1c 9.1%. Hyperglycemic on admission glucose 279.  Patient had normal bicarb and normal anion gap, however beta hydroxybutyrate was checked in the ED and mildly elevated he was therefore treated with IV insulin in the ED.  Patient does not have DKA or HHS however, will resume subcutaneous insulin. -- Continue basal insulin at home dose -- Sliding scale NovoLog  TOBACCO DEPENDENCE- (present on admission) Counseled on importance of cessation.      {Tip this will not be part of the note when signed Body mass index is 29.84 kg/m. , ,  (Optional):26781}  Subjective: Pt awake resting in bed.  His foot pain was uncontrolled overnight and earlier AM but reports improvement with Percocet.  No other acute complaints.  Doesn't feel ready to d/c without pain better controlled.   Physical Exam: Vitals:   05/13/21 1512 05/13/21 2022 05/14/21 0409 05/14/21 0719  BP: 131/68 (!) 145/72 (!) 151/88 132/61  Pulse: 62 66 68 63  Resp:  18 18   Temp: 97.6 F (36.4 C) 98.4 F (36.9 C) (!) 97.5 F (36.4 C) 97.9 F (36.6 C)  TempSrc:  Oral Oral   SpO2: 100% 100% 95% 95%  Weight:      Height:       General exam: awake, alert, no acute distress HEENT: moist mucus membranes, hearing grossly normal  Respiratory system: CTAB, no wheezes, rales or rhonchi, normal respiratory effort. Cardiovascular system: normal  S1/S2, RRRSM felt, +bowel sounds. Central nervous system: A&O x3. no gross focal neurologic deficits, normal speech Extremities: R BKA, left foot with gauze dressing, no edema, normal tone Psychiatry: normal mood, congruent affect, judgement and insight appear normal   Data  Reviewed:  No new labs.  CBG's at goal  Family Communication: none  Disposition: Status is: Inpatient Remains inpatient appropriate because: Requires d/c to SNF pending adequate pain control.          Planned Discharge Destination: Skilled nursing facility  {Tip this will not be part of the note when signed  DVT Prophylaxis  ., Enoxaparin (lovenox) injection 40 mg       (Optional):26781}  Time spent: 35 minutes  Author: Leeroy Bock, MD 05/14/2021 7:52 AM  For on call review [email or web address].

## 2021-05-15 ENCOUNTER — Encounter (INDEPENDENT_AMBULATORY_CARE_PROVIDER_SITE_OTHER): Payer: Medicare Other | Admitting: Nurse Practitioner

## 2021-05-16 LAB — AEROBIC/ANAEROBIC CULTURE W GRAM STAIN (SURGICAL/DEEP WOUND)

## 2021-10-03 NOTE — Progress Notes (Signed)
Requested PCP notes.  

## 2021-10-09 NOTE — Progress Notes (Signed)
Cardiology Office Note  Date:  10/10/2021   ID:  Miguel Harvey, DOB 04/03/1945, MRN [id number]  PCP:  Miguel Northern, MD   Chief Complaint  Patient presents with   Other    OD 12 month f/u no complaints today. Meds reviewed verbally with pt.    HPI:  Miguel Harvey is a 76 y.o. male with a hx of  Diabetes type 2 PAD s/p right BKA s/p balloon angioplasty to the left peroneal artery, left posterior tibial artery, tibioperoneal trunk, and stenting of the distal left SFA in 07/2017 Nonobstructive coronary artery disease,  cardiomyopathy ejection fraction 25% on echocardiogram,  smoker hyperlipidemia, diabetes, hypertension, presenting to the hospital with diabetic foot ulcer/infection, Has prior below-knee amputation right lower extremity one month ago in [place] Long-standing ulceration left foot with no healing Does not ambulate much at baseline Started on Zosyn and vancomycin, Was being prepped for debridement left foot with Miguel Harvey PEA arrest EF in 05/2019 up to normal 50 to 55% Who presents for follow-up of his cardiomyopathy  Last seen by myself in clinic February 2022 Lives at [facility] Takes care of himself Daughter visits Has prosthesis for right leg, did not bring it with him today Can walk with walker Makes his own bed, tries to do things for himself  Still smoking: 1/2 ppd-3/4 ppd Drinking coffee, "only joy"  Labs reviewed: Lab work from beginning of 2023, A1C 9.1, on insulin No recent lipid panel  Denies chest pain concerning for angina No claudication symptoms Followed by Miguel Harvey  Trace leg edema on left, sits long hours with leg down  Echocardiogram May 25, 2019 Ejection fraction 50 to 55%  EKG personally reviewed by myself on todays visit  NSR rate 74 bpm, poor R wave progression  PMH:   has a past medical history of Abnormal posture, CHF (congestive heart failure) (HCC), Chronic kidney disease, Complication of anesthesia, COPD (chronic  obstructive pulmonary disease) (HCC), Coronary artery disease, non-occlusive, Depression, Diabetes mellitus, HLD (hyperlipidemia), Hypertension, Muscle weakness, Neuromuscular disorder (HCC), NICM (nonischemic cardiomyopathy) (HCC), NSVT (nonsustained ventricular tachycardia) (HCC), PAD (peripheral artery disease) (HCC), and PEA (Pulseless electrical activity) (HCC).  PSH:    Past Surgical History:  Procedure Laterality Date   AMPUTATION Right 07/02/2017   Procedure: RIGHT BELOW KNEE AMPUTATION;  Surgeon: Nadara Mustard, MD;  [facility] OR;  Service: Orthopedics;  Laterality: Right;   AMPUTATION TOE Left 05/11/2021   Procedure: LEFT GREAT  TOE AMPUTATION;  Surgeon: Gwyneth Revels, DPM;  Location: [facility] ORS;  Service: Podiatry;  Laterality: Left;   CARDIAC CATHETERIZATION     CATARACT EXTRACTION W/PHACO Left 11/08/2018   Procedure: CATARACT EXTRACTION PHACO AND INTRAOCULAR LENS PLACEMENT (IOC)  LEFT VISION BLUE;  Surgeon: [place] Cousin, MD;  Location: [facility] ORS;  Service: Ophthalmology;  Laterality: Left;  [place] 03:38.9 CDE 42.37 Fluid pack lot # [id number]   cyst removal from  right leg about 6 years ago     IRRIGATION AND DEBRIDEMENT FOOT Left 08/10/2017   Procedure: IRRIGATION AND DEBRIDEMENT FOOT;  Surgeon: Gwyneth Revels, DPM;  Location: [facility] ORS;  Service: Podiatry;  Laterality: Left;   LEFT HEART CATHETERIZATION WITH CORONARY ANGIOGRAM N/A 05/11/2011   Procedure: LEFT HEART CATHETERIZATION WITH CORONARY ANGIOGRAM;  Surgeon: Lennette Bihari, MD;  Location: [facility] CATH LAB;  Service: Cardiovascular;  Laterality: N/A;   LOWER EXTREMITY ANGIOGRAPHY Left 08/13/2017   Procedure: Lower Extremity Angiography and PICC line placement;  Surgeon: Annice Needy, MD;  Location: [facility] INVASIVE  CV LAB;  Service: Cardiovascular;  Laterality: Left;   ruptured navel     repair of   TOE AMPUTATION     not sure of when this surgery occured, but his heart stopped beating x 2 during surgery    Current Outpatient Medications   Medication Sig Dispense Refill   acetaminophen (TYLENOL) 325 MG tablet Take 2 tablets (650 mg total) by mouth every 6 (six) hours as needed for mild pain (or Fever >/= 101).     aspirin EC 81 MG EC tablet Take 1 tablet (81 mg total) by mouth daily. 30 tablet 0   atorvastatin (LIPITOR) 20 MG tablet Take 20 mg by mouth every evening.      carvedilol (COREG) 6.25 MG tablet Take 6.25 mg by mouth 2 (two) times daily with a meal.     clopidogrel (PLAVIX) 75 MG tablet Take 1 tablet (75 mg total) by mouth daily. 30 tablet 0   Cyanocobalamin (B-12 PO) Take by mouth daily at 2 am.     Dulaglutide (TRULICITY) 1.5 MG/0.5ML SOPN Inject 1.5 mg into the skin every Friday.     furosemide (LASIX) 20 MG tablet Take 20 mg by mouth daily.     guaiFENesin (ROBITUSSIN) 100 MG/5ML liquid Take 5 mLs by mouth every 4 (four) hours as needed for cough or to loosen phlegm.     insulin lispro (HUMALOG) 100 UNIT/ML KwikPen Inject 5 Units into the skin 3 (three) times daily. HOLD IF CBG < or = 150     ipratropium-albuterol (DUONEB) 0.5-2.5 (3) MG/3ML SOLN Take by nebulization as needed.     LANTUS SOLOSTAR 100 UNIT/ML Solostar Pen Inject 8 Units into the skin daily.     linagliptin (TRADJENTA) 5 MG TABS tablet Take 5 mg by mouth daily.     Magnesium 400 MG TABS Take 400 mg by mouth 2 (two) times daily.      Multiple Vitamin (MULTIVITAMIN WITH MINERALS) TABS tablet Take 1 tablet by mouth daily.     No current facility-administered medications for this visit.    Allergies:   Spironolactone   Social History:  The patient  reports that he has been smoking cigarettes. He quit smokeless tobacco use about 10 years ago. He reports that he does not currently use alcohol after a past usage of about 6.0 standard drinks of alcohol per week. He reports that he does not use drugs.   Family History:   family history includes CAD in an other family member; Diabetes in an other family member.    Review of Systems: Review of Systems   Constitutional: Negative.   HENT: Negative.    Respiratory: Negative.    Cardiovascular: Negative.   Gastrointestinal: Negative.   Musculoskeletal: Negative.   Neurological: Negative.   Psychiatric/Behavioral: Negative.    All other systems reviewed and are negative.   PHYSICAL EXAM: VS:  BP 132/60 (BP Location: Right Arm, Patient Position: Sitting, Cuff Size: Normal)   Pulse 74   Ht 6' (1.829 m)   Wt 210 lb (95.3 kg)   SpO2 99%   BMI 28.48 kg/m  , BMI Body mass index is 28.48 kg/m. Constitutional:  oriented to person, place, and time. No distress.  HENT:  Head: Grossly normal Eyes:  no discharge. No scleral icterus.  Neck: No JVD, no carotid bruits  Cardiovascular: Regular rate and rhythm, no murmurs appreciated Pulmonary/Chest: Clear to auscultation bilaterally, no wheezes or rails Abdominal: Soft.  no distension.  no tenderness.  Musculoskeletal: Normal range  of motion, amputation right leg below the knee Neurological:  normal muscle tone. Coordination normal. No atrophy Skin: Skin warm and dry Psychiatric: normal affect, pleasant   Recent Labs: 05/08/2021: ALT 14 05/10/2021: BUN 15; Creatinine, Ser 1.09; Hemoglobin 9.9; Platelets 261; Potassium 3.7; Sodium 135    Lipid Panel Lab Results  Component Value Date   CHOL 96 (L) 11/07/2019   HDL 27 (L) 11/07/2019   LDLCALC 54 11/07/2019   TRIG 69 11/07/2019      Wt Readings from Last 3 Encounters:  10/10/21 210 lb (95.3 kg)  05/08/21 220 lb (99.8 kg)  04/17/21 220 lb (99.8 kg)       ASSESSMENT AND PLAN:  Problem List Items Addressed This Visit       Cardiology Problems   PAD (peripheral artery disease) (HCC)   HTN (hypertension), malignant   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)   Other Visit Diagnoses     Chronic systolic heart failure (HCC)    -  Primary   Essential hypertension       NSVT (nonsustained ventricular tachycardia) (HCC)       Type 2 diabetes mellitus without  complication, unspecified whether long term insulin use (HCC)       Hyperlipidemia LDL goal <70       Current smoker          PAD History of amputation right leg below the knee Has f/u with  Miguel Harvey, Denies claudication,last ABI in 03/2021 Amputation on right lower extremity, has a prosthesis, did not bring with him today  HTN Blood pressure is well controlled on today's visit. No changes made to the medications.  hyperlipiedmia Lipid panel ordered Goal LDL less than 70  Diabetes: A1C was 9, repeat ordered Working with primary care, on insulin  NSVT No palpitations  Smoker We have encouraged him to continue to work on weaning his cigarettes and smoking cessation. He will continue to work on this and does not Harvey any assistance with chantix.      Total encounter time more than 30 minutes  Greater than 50% was spent in counseling and coordination of care with the patient    Signed, Dossie Arbour, M.D., Ph.D. [facility] [place], [place] [phone]

## 2021-10-10 ENCOUNTER — Ambulatory Visit (INDEPENDENT_AMBULATORY_CARE_PROVIDER_SITE_OTHER): Payer: Commercial Managed Care - HMO | Admitting: Cardiovascular Disease

## 2021-10-10 ENCOUNTER — Encounter: Payer: Self-pay | Admitting: Cardiovascular Disease

## 2021-10-10 VITALS — BP 132/60 | HR 74 | Ht 72.0 in | Wt 210.0 lb

## 2021-10-10 DIAGNOSIS — I5022 Chronic systolic (congestive) heart failure: Secondary | ICD-10-CM | POA: Diagnosis not present

## 2021-10-10 DIAGNOSIS — E119 Type 2 diabetes mellitus without complications: Secondary | ICD-10-CM

## 2021-10-10 DIAGNOSIS — F17200 Nicotine dependence, unspecified, uncomplicated: Secondary | ICD-10-CM

## 2021-10-10 DIAGNOSIS — E785 Hyperlipidemia, unspecified: Secondary | ICD-10-CM

## 2021-10-10 DIAGNOSIS — I4729 Other ventricular tachycardia: Secondary | ICD-10-CM | POA: Diagnosis not present

## 2021-10-10 DIAGNOSIS — I739 Peripheral vascular disease, unspecified: Secondary | ICD-10-CM | POA: Diagnosis not present

## 2021-10-10 DIAGNOSIS — I10 Essential (primary) hypertension: Secondary | ICD-10-CM

## 2021-10-10 DIAGNOSIS — I70269 Atherosclerosis of native arteries of extremities with gangrene, unspecified extremity: Secondary | ICD-10-CM

## 2021-10-10 NOTE — Patient Instructions (Signed)
Medication Instructions:  No changes  If you need a refill on your cardiac medications before your next appointment, please call your pharmacy.   Lab work: We will order labs at [facility]: Lipid panel, A1C  Testing/Procedures: No new testing needed  Follow-Up: At [facility], you and your health needs are our priority.  As part of our continuing mission to provide you with exceptional heart care, we have created designated Provider Care Teams.  These Care Teams include your primary Cardiologist (physician) and Advanced Practice Providers (APPs -  Physician Assistants and Nurse Practitioners) who all work together to provide you with the care you need, when you need it.  You will need a follow up appointment in 12 months  Providers on your designated Care Team:   Nicolasa Ducking, NP Eula Listen, PA-C Cadence Fransico [place], [place]  COVID-19 Vaccine Information can be found at: [email or web address] For questions related to vaccine distribution or appointments, please email [email or web address] or call [phone].

## 2022-12-01 ENCOUNTER — Other Ambulatory Visit: Payer: Self-pay

## 2022-12-01 DIAGNOSIS — E118 Type 2 diabetes mellitus with unspecified complications: Secondary | ICD-10-CM

## 2022-12-21 ENCOUNTER — Other Ambulatory Visit (INDEPENDENT_AMBULATORY_CARE_PROVIDER_SITE_OTHER): Payer: 59

## 2022-12-21 DIAGNOSIS — E118 Type 2 diabetes mellitus with unspecified complications: Secondary | ICD-10-CM | POA: Diagnosis not present

## 2022-12-21 DIAGNOSIS — Z794 Long term (current) use of insulin: Secondary | ICD-10-CM | POA: Diagnosis not present

## 2022-12-21 LAB — LIPID PANEL
Cholesterol: 179 mg/dL (ref 0–200)
HDL: 37.3 mg/dL — ABNORMAL LOW (ref >39.00)
LDL Cholesterol: 116 mg/dL — ABNORMAL HIGH (ref 0–99)
NonHDL: 142.19
Total CHOL/HDL Ratio: 5
Triglycerides: 129 mg/dL (ref 0.0–149.0)
VLDL: 25.8 mg/dL (ref 0.0–40.0)

## 2022-12-21 LAB — COMPREHENSIVE METABOLIC PANEL
ALT: 16 U/L (ref 0–53)
AST: 12 U/L (ref 0–37)
Albumin: 3.7 g/dL (ref 3.5–5.2)
Alkaline Phosphatase: 76 U/L (ref 39–117)
BUN: 31 mg/dL — ABNORMAL HIGH (ref 6–23)
CO2: 24 mEq/L (ref 19–32)
Calcium: 9 mg/dL (ref 8.4–10.5)
Chloride: 106 mEq/L (ref 96–112)
Creatinine, Ser: 1.32 mg/dL (ref 0.40–1.50)
GFR: 51.95 mL/min — ABNORMAL LOW (ref >60.00)
Glucose, Bld: 207 mg/dL — ABNORMAL HIGH (ref 70–99)
Potassium: 4.4 mEq/L (ref 3.5–5.1)
Sodium: 140 mEq/L (ref 135–145)
Total Bilirubin: 0.2 mg/dL (ref 0.2–1.2)
Total Protein: 6.4 g/dL (ref 6.0–8.3)

## 2022-12-21 LAB — MICROALBUMIN / CREATININE URINE RATIO
Creatinine,U: 107.1 mg/dL
Microalb Creat Ratio: 4.1 mg/g (ref 0.0–30.0)
Microalb, Ur: 4.4 mg/dL — ABNORMAL HIGH (ref 0.0–1.9)

## 2022-12-21 LAB — HEMOGLOBIN A1C: Hgb A1c MFr Bld: 9.8 % — ABNORMAL HIGH (ref 4.6–6.5)

## 2022-12-28 ENCOUNTER — Ambulatory Visit (INDEPENDENT_AMBULATORY_CARE_PROVIDER_SITE_OTHER): Payer: 59 | Admitting: "Endocrinology

## 2022-12-28 ENCOUNTER — Encounter: Payer: Self-pay | Admitting: "Endocrinology

## 2022-12-28 VITALS — BP 153/80 | HR 84 | Ht 72.0 in

## 2022-12-28 DIAGNOSIS — Z794 Long term (current) use of insulin: Secondary | ICD-10-CM | POA: Diagnosis not present

## 2022-12-28 DIAGNOSIS — E7800 Pure hypercholesterolemia, unspecified: Secondary | ICD-10-CM | POA: Diagnosis not present

## 2022-12-28 DIAGNOSIS — E1165 Type 2 diabetes mellitus with hyperglycemia: Secondary | ICD-10-CM

## 2022-12-28 DIAGNOSIS — Z7984 Long term (current) use of oral hypoglycemic drugs: Secondary | ICD-10-CM

## 2022-12-28 MED ORDER — DEXCOM G7 RECEIVER DEVI
1.0000 | 0 refills | Status: DC
Start: 2022-12-28 — End: 2023-02-18

## 2022-12-28 MED ORDER — DEXCOM G7 SENSOR MISC: 1.0000 | 0 refills | Status: DC

## 2022-12-28 NOTE — Progress Notes (Signed)
Outpatient Endocrinology Note Miguel Belle Rive, MD  12/28/22   Miguel Harvey 1946/01/09 [id number]  Referring Provider: Wyman Harvey, AGNP-C Primary Care Provider: [name], MD Reason for consultation: Subjective   Assessment & Plan  Diagnoses and all orders for this visit:  Uncontrolled type 2 diabetes mellitus with hyperglycemia (HCC) -     Continuous Glucose Sensor (DEXCOM G7 SENSOR) MISC; 1 Device by Does not apply route continuous. -     Continuous Glucose Receiver (DEXCOM G7 RECEIVER) DEVI; 1 Device by Does not apply route continuous.  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, ,  Lab Results  Component Value Date   GFR 51.95 (L) 12/21/2022   Hba1c goal less than 7, current Hba1c is  Lab Results  Component Value Date   HGBA1C 9.8 (H) 12/21/2022   On recommend the following: Toujeo 13 units at bedtime Humalog 10 units prn >200 once a day  Tradjenta 5 mg qd Ordered BG log from [facility] to adjust BG Sent prescription for dexcom  Check BG tidac, at bedtime  No known contraindications/side effects to any of above medications   -Last LD and Tg are as follows: Lab Results  Component Value Date   LDLCALC 116 (H) 12/21/2022    Lab Results  Component Value Date   TRIG 129.0 12/21/2022   -not on statin  -Follow low fat diet and exercise   -Blood pressure goal <140/90 - Microalbumin/creatinine goal is < 30 -Last MA/Cr is as follows: Lab Results  Component Value Date   MICROALBUR 4.4 (H) 12/21/2022   - on ACE/ARB lisinopril 5 mg every day  -diet changes including salt restriction -limit eating outside -counseled BP targets per standards of diabetes care -uncontrolled blood pressure can lead to retinopathy, nephropathy and cardiovascular and atherosclerotic heart disease  Reviewed and counseled on: -A1C target -Blood  sugar targets -Complications of uncontrolled diabetes  -Checking blood sugar before meals and bedtime and bring log next visit -All medications with mechanism of action and side effects -Hypoglycemia management: rule of 15's, Glucagon Emergency Kit and medical alert ID -low-carb low-fat plate-method diet -At least 20 minutes of physical activity per day -Annual dilated retinal eye exam and foot exam -compliance and follow up needs -follow up as scheduled or earlier if problem gets worse  Call if blood sugar is less than 70 or consistently above 250    Take a 15 gm snack of carbohydrate at bedtime before you go to sleep if your blood sugar is less than 100.    If you are going to fast after midnight for a test or procedure, ask your physician for instructions on how to reduce/decrease your insulin dose.    Call if blood sugar is less than 70 or consistently above 250  -Treating a low sugar by rule of 15  (15 gms of sugar every 15 min until sugar is more than 70) If you feel your sugar is low, test your sugar to be sure If your sugar is low (less than 70), then take 15 grams of a fast acting Carbohydrate (3-4 glucose tablets or glucose gel or 4 ounces of juice or regular soda) Recheck your sugar 15 min after treating low to make sure it is more than 70 If sugar is still less than 70, treat again with 15 grams of carbohydrate  Don't drive the hour of hypoglycemia  If unconscious/unable to eat or drink by mouth, use glucagon injection or nasal spray baqsimi and call 911. Can repeat again in 15 min if still unconscious.  Return in about 4 weeks (around 01/25/2023) for All future vivits need to be tele-visit: 3:20 pm with Miguel Harvey health care.   I have reviewed current medications, nurse's notes, allergies, vital signs, past medical and surgical history, family medical history, and social history for this encounter. Counseled patient on symptoms, examination findings, lab findings,  imaging results, treatment decisions and monitoring and prognosis. The patient understood the recommendations and agrees with the treatment plan. All questions regarding treatment plan were fully answered.  Miguel Fairhaven, MD  12/28/22    History of Present Illness Miguel Harvey is a 77 y.o. year old male who presents for evaluation of Type II diabetes mellitus.  Miguel Harvey was first diagnosed in around 2019.   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 -  nephropathy  BLOOD SUGAR DATA 2-3 times a day Did not bring meter 138-260 per recall  Physical Exam  BP (!) 153/80   Pulse 84   Ht 6' (1.829 m)   SpO2 95%   BMI 28.48 kg/m    Constitutional: well developed, well nourished Head: normocephalic, atraumatic Eyes: sclera anicteric, no redness Neck: supple Lungs: normal respiratory effort Neurology: alert and oriented Skin: dry, no appreciable rashes Musculoskeletal: no appreciable defects Psychiatric: normal mood and affect Diabetic Foot Exam - Simple   No data filed      Current Medications Patient's Medications  New Prescriptions   CONTINUOUS GLUCOSE RECEIVER (DEXCOM G7 RECEIVER) DEVI    1 Device by Does not apply route continuous.   CONTINUOUS GLUCOSE SENSOR (DEXCOM G7 SENSOR) MISC    1 Device by Does not apply route continuous.  Previous Medications   ACETAMINOPHEN (TYLENOL) 325 MG TABLET    Take 2 tablets (650 mg total) by mouth every 6 (six) hours as needed for mild pain (or Fever >/= 101).   ASPIRIN EC 81 MG EC TABLET    Take 1 tablet (81 mg total) by mouth daily.   ATORVASTATIN (LIPITOR) 20 MG TABLET    Take 20 mg by mouth every evening.    CARBOXYMETHYLCELLULOSE (REFRESH PLUS) 0.5 % SOLN    Place 1 drop into both eyes 4 (four) times daily.   CARVEDILOL (COREG) 6.25 MG  TABLET    Take 6.25 mg by mouth 2 (two) times daily with a meal.   CLOPIDOGREL (PLAVIX) 75 MG TABLET    Take 1 tablet (75 mg total) by mouth daily.   CYANOCOBALAMIN (B-12 PO)    Take by mouth daily at 2 am.   DULAGLUTIDE (TRULICITY) 1.5 MG/0.5ML SOPN    Inject 1.5 mg into the skin every Friday.   FUROSEMIDE (LASIX) 20 MG TABLET    Take 20 mg by mouth daily.   GUAIFENESIN (ROBITUSSIN) 100 MG/5ML LIQUID    Take 5 mLs by mouth every 4 (four) hours as needed for cough or to loosen phlegm.   INSULIN GLARGINE, 2 UNIT DIAL, (TOUJEO MAX SOLOSTAR) 300 UNIT/ML SOLOSTAR PEN    Inject 10 Units into the skin daily at 6 (six) AM.   INSULIN LISPRO (HUMALOG) 100 UNIT/ML  KWIKPEN    Inject 10-13 Units into the skin 3 (three) times daily. HOLD IF CBG < or = 150   IPRATROPIUM-ALBUTEROL (DUONEB) 0.5-2.5 (3) MG/3ML SOLN    Take by nebulization as needed.   LINAGLIPTIN (TRADJENTA) 5 MG TABS TABLET    Take 5 mg by mouth daily.   LISINOPRIL (ZESTRIL) 5 MG TABLET    Take 5 mg by mouth daily.   MAGNESIUM 400 MG TABS    Take 400 mg by mouth 2 (two) times daily.    MULTIPLE VITAMIN (MULTIVITAMIN WITH MINERALS) TABS TABLET    Take 1 tablet by mouth daily.   TRAZODONE (DESYREL) 50 MG TABLET    Take 0.5 mg by mouth at bedtime.  Modified Medications   No medications on file  Discontinued Medications   LANTUS SOLOSTAR 100 UNIT/ML SOLOSTAR PEN    Inject 8 Units into the skin daily.    Allergies Allergies  Allergen Reactions   Spironolactone     Hyperkalemia (currently takes spironolactone)    Past Medical History Past Medical History:  Diagnosis Date   Abnormal posture    CHF (congestive heart failure) (HCC)    Chronic kidney disease    CKD   stage 2   Complication of anesthesia    heart stopped x2 during toe amputation   COPD (chronic obstructive pulmonary disease) (HCC)    Coronary artery disease, non-occlusive    a. LHC 05/2011: 10-20% ostial LAD stenosis   Depression    Diabetes mellitus    HLD  (hyperlipidemia)    Hypertension    Muscle weakness    Neuromuscular disorder (HCC)    neuropathy   NICM (nonischemic cardiomyopathy) (HCC)    a. TTE 2/213: EF 50-55%, mild LVH, normal wall motion, Gr2DD, mildly dilated LA; b. TTE 2/13: EF 40-45%, mild concentric LVH, global HK, no evidence of LV thrombus; c. TTE 4/19: EF 40%, diffuse HK, Gr2DD, mild MR, mildly dilated LA, RV cavity size normal w/ normal RVSF, PASP 63; d. TTE 4/19: EF 25-30%, diffuse HK, mild LVH, mild MR, mild PASP; e. TTE 5/19: EF 20-25%, diffuse HK, moderate MR,   NSVT (nonsustained ventricular tachycardia) (HCC)    PAD (peripheral artery disease) (HCC)    a. s/p right BKA s/p  recent balloon angioplasty fo the left peroneal artery, left posterior tibial artery, tibioperoneal trunk, and stenting to the distal left SFA in 07/2017   PEA (Pulseless electrical activity) (HCC)    heart stopped beating during surgery for amputation of toes    Past Surgical History Past Surgical History:  Procedure Laterality Date   AMPUTATION Right 07/02/2017   Procedure: RIGHT BELOW KNEE AMPUTATION;  Surgeon: Nadara Mustard, MD;  Location: [facility] OR;  Service: Orthopedics;  Laterality: Right;   AMPUTATION TOE Left 05/11/2021   Procedure: LEFT GREAT  TOE AMPUTATION;  Surgeon: Gwyneth Revels, DPM;  Location: [facility] ORS;  Service: Podiatry;  Laterality: Left;   CARDIAC CATHETERIZATION     CATARACT EXTRACTION W/PHACO Left 11/08/2018   Procedure: CATARACT EXTRACTION PHACO AND INTRAOCULAR LENS PLACEMENT (IOC)  LEFT VISION BLUE;  Surgeon: Elliot Cousin, MD;  Location: [facility] ORS;  Service: Ophthalmology;  Laterality: Left;  [place] 03:38.9 CDE 42.37 Fluid pack lot # [id number]   cyst removal from  right leg about 6 years ago     IRRIGATION AND DEBRIDEMENT FOOT Left 08/10/2017   Procedure: IRRIGATION AND DEBRIDEMENT FOOT;  Surgeon: Gwyneth Revels, DPM;  Location: [facility] ORS;  Service: Podiatry;  Laterality: Left;  LEFT HEART CATHETERIZATION WITH CORONARY ANGIOGRAM N/A  05/11/2011   Procedure: LEFT HEART CATHETERIZATION WITH CORONARY ANGIOGRAM;  Surgeon: Lennette Bihari, MD;  Location: [facility] CATH LAB;  Service: Cardiovascular;  Laterality: N/A;   LOWER EXTREMITY ANGIOGRAPHY Left 08/13/2017   Procedure: Lower Extremity Angiography and PICC line placement;  Surgeon: Annice Needy, MD;  Location: [facility] INVASIVE CV LAB;  Service: Cardiovascular;  Laterality: Left;   ruptured navel     repair of   TOE AMPUTATION     not sure of when this surgery occured, but his heart stopped beating x 2 during surgery    Family History family history includes CAD in an other family member; Diabetes in an other family member.  Social History Social History   Socioeconomic History   Marital status: Single    Spouse name: danielle .... daughter   Number of children: Not on file   Years of education: Not on file   Highest education level: Not on file  Occupational History   Not on file  Tobacco Use   Smoking status: Every Day    Current packs/day: 0.00    Types: Cigarettes   Smokeless tobacco: Former    Quit date: 05/07/2011   Tobacco comments:    3 cigs per day (previous hx of smoking 1 PPD)  Vaping Use   Vaping status: Never Used  Substance and Sexual Activity   Alcohol use: Not Currently    Alcohol/week: 6.0 standard drinks of alcohol    Types: 6 Cans of beer per week   Drug use: Never   Sexual activity: Not on file  Other Topics Concern   Not on file  Social History Narrative   Not on file   Social Determinants of Health   Financial Resource Strain: Not on file  Food Insecurity: Not on file  Transportation Needs: Not on file  Physical Activity: Inactive (08/05/2017)   Exercise Vital Sign    Days of Exercise per Week: 0 days    Minutes of Exercise per Session: 0 min  Stress: No Stress Concern Present (08/05/2017)   [employer] of Occupational Health - Occupational Stress Questionnaire    Feeling of Stress : Not at all  Social Connections: Not on file   Intimate Partner Violence: Not on file    Lab Results  Component Value Date   HGBA1C 9.8 (H) 12/21/2022   HGBA1C 9.1 (H) 05/08/2021   HGBA1C 9.1 (H) 11/17/2020   Lab Results  Component Value Date   CHOL 179 12/21/2022   Lab Results  Component Value Date   HDL 37.30 (L) 12/21/2022   Lab Results  Component Value Date   LDLCALC 116 (H) 12/21/2022   Lab Results  Component Value Date   TRIG 129.0 12/21/2022   Lab Results  Component Value Date   CHOLHDL 5 12/21/2022   Lab Results  Component Value Date   CREATININE 1.32 12/21/2022   Lab Results  Component Value Date   GFR 51.95 (L) 12/21/2022   Lab Results  Component Value Date   MICROALBUR 4.4 (H) 12/21/2022      Component Value Date/Time   NA 140 12/21/2022 0910   NA 141 11/07/2019 1439   K 4.4 12/21/2022 0910   CL 106 12/21/2022 0910   CO2 24 12/21/2022 0910   GLUCOSE 207 (H) 12/21/2022 0910   BUN 31 (H) 12/21/2022 0910   BUN 14 11/07/2019 1439   CREATININE 1.32 12/21/2022 0910   CREATININE 1.43 (H) 09/21/2012 1150  CALCIUM 9.0 12/21/2022 0910   PROT 6.4 12/21/2022 0910   ALBUMIN 3.7 12/21/2022 0910   AST 12 12/21/2022 0910   ALT 16 12/21/2022 0910   ALKPHOS 76 12/21/2022 0910   BILITOT 0.2 12/21/2022 0910   GFRNONAA >60 05/10/2021 0322   GFRAA 73 11/07/2019 1439      Latest Ref Rng & Units 12/21/2022    9:10 AM 05/10/2021    3:22 AM 05/09/2021    5:27 AM  BMP  Glucose 70 - 99 mg/dL 161  096  045   BUN 6 - 23 mg/dL 31  15  16    Creatinine 0.40 - 1.50 mg/dL 4.09  8.11  9.14   Sodium 135 - 145 mEq/L 140  135  133   Potassium 3.5 - 5.1 mEq/L 4.4  3.7  3.6   Chloride 96 - 112 mEq/L 106  104  104   CO2 19 - 32 mEq/L 24  22  22    Calcium 8.4 - 10.5 mg/dL 9.0  8.2  7.8        Component Value Date/Time   WBC 10.5 05/10/2021 0322   RBC 3.67 (L) 05/10/2021 0322   HGB 9.9 (L) 05/10/2021 0322   HGB 11.6 (L) 11/07/2019 1439   HCT 30.9 (L) 05/10/2021 0322   HCT 35.7 (L) 11/07/2019 1439   PLT 261  05/10/2021 0322   PLT 288 11/07/2019 1439   MCV 84.2 05/10/2021 0322   MCV 89 11/07/2019 1439   MCH 27.0 05/10/2021 0322   MCHC 32.0 05/10/2021 0322   RDW 13.7 05/10/2021 0322   RDW 13.6 11/07/2019 1439   LYMPHSABS 0.9 05/08/2021 1230   LYMPHSABS 1.9 10/14/2017 0946   MONOABS 0.6 05/08/2021 1230   EOSABS 0.0 05/08/2021 1230   EOSABS 0.3 10/14/2017 0946   BASOSABS 0.0 05/08/2021 1230   BASOSABS 0.0 10/14/2017 0946     Parts of this note may have been dictated using voice recognition software. There may be variances in spelling and vocabulary which are unintentional. Not all errors are proofread. Please notify the Thereasa Parkin if any discrepancies are noted or if the meaning of any statement is not clear.

## 2023-01-06 ENCOUNTER — Other Ambulatory Visit: Payer: Self-pay | Admitting: "Endocrinology

## 2023-01-06 NOTE — Progress Notes (Signed)
Please let the patient at [facility] know that I reviewed his blood sugars faxed.  The Toujeo needs to be increased to 18 units every day, and change Humalog to 5 units for blood sugar more than 150 and 10 units for blood sugar more than 200.  Thanks

## 2023-01-07 NOTE — Progress Notes (Signed)
I spoke to Nurse Misty at [facility] for Mr Tavano and she is aware of the the Toujeo and Humalog increases

## 2023-01-25 ENCOUNTER — Encounter: Payer: 59 | Admitting: "Endocrinology

## 2023-01-25 NOTE — Progress Notes (Signed)
Erroneous encounter

## 2023-02-18 ENCOUNTER — Encounter: Payer: Self-pay | Admitting: "Endocrinology

## 2023-02-18 ENCOUNTER — Ambulatory Visit (INDEPENDENT_AMBULATORY_CARE_PROVIDER_SITE_OTHER): Payer: 59 | Admitting: "Endocrinology

## 2023-02-18 VITALS — BP 120/62 | HR 75 | Ht 72.0 in | Wt 220.0 lb

## 2023-02-18 DIAGNOSIS — E1165 Type 2 diabetes mellitus with hyperglycemia: Secondary | ICD-10-CM | POA: Diagnosis not present

## 2023-02-18 DIAGNOSIS — Z7984 Long term (current) use of oral hypoglycemic drugs: Secondary | ICD-10-CM | POA: Diagnosis not present

## 2023-02-18 DIAGNOSIS — E7800 Pure hypercholesterolemia, unspecified: Secondary | ICD-10-CM

## 2023-02-18 DIAGNOSIS — Z794 Long term (current) use of insulin: Secondary | ICD-10-CM

## 2023-02-18 MED ORDER — DEXCOM G7 SENSOR MISC: 1.0000 | 0 refills | Status: AC

## 2023-02-18 MED ORDER — DEXCOM G7 RECEIVER DEVI: 1.0000 | 0 refills | Status: AC

## 2023-02-18 NOTE — Progress Notes (Signed)
Outpatient Endocrinology Note Miguel Datto, MD  02/18/23   Miguel Harvey 09/13/1945 [id number]  Referring Provider: [name], MD Primary Care Provider: [name], MD Reason for consultation: Subjective   Assessment & Plan  Diagnoses and all orders for this visit:  Uncontrolled type 2 diabetes mellitus with hyperglycemia (HCC) -     Continuous Glucose Receiver (DEXCOM G7 RECEIVER) DEVI; 1 Device by Does not apply route continuous. -     Continuous Glucose Sensor (DEXCOM G7 SENSOR) MISC; 1 Device by Does not apply route continuous. 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, ,  Lab Results  Component Value Date   GFR 51.95 (L) 12/21/2022   Hba1c goal less than 7, current Hba1c is  Lab Results  Component Value Date   HGBA1C 9.8 (H) 12/21/2022   On recommend the following: Toujeo 20 units at bedtime Lyumjev 8 units tid ac (hold if BG <100) Tradjenta 5 mg every day 02/18/23 Ordered DM education 02/18/23 Ordered podiatry referral In  nursing home  02/18/23  Re-ordered and sent prescription for dexcom  Check BG tidac, at bedtime Glucagon discussed and prescribed with refills on 12/28/22  No known contraindications/side effects to any of above medications   -Last LD and Tg are as follows: Lab Results  Component Value Date   LDLCALC 116 (H) 12/21/2022    Lab Results  Component Value Date   TRIG 129.0 12/21/2022   -on atorvastatin 20 mg every day   -Follow low fat diet and exercise   -Blood pressure goal <140/90 - Microalbumin/creatinine goal is < 30 -Last MA/Cr is as follows: Lab Results  Component Value Date   MICROALBUR 4.4 (H) 12/21/2022   - on ACE/ARB lisinopril 5 mg every day  -diet changes including salt restriction -limit eating  outside -counseled BP targets per standards of diabetes care -uncontrolled blood pressure can lead to retinopathy, nephropathy and cardiovascular and atherosclerotic heart disease  Reviewed and counseled on: -A1C target -Blood sugar targets -Complications of uncontrolled diabetes  -Checking blood sugar before meals and bedtime and bring log next visit -All medications with mechanism of action and side effects -Hypoglycemia management: rule of 15's, Glucagon Emergency Kit and medical alert ID -low-carb low-fat plate-method diet -At least 20 minutes of physical activity per day -Annual dilated retinal eye exam and foot exam -compliance and follow up needs -follow up as scheduled or earlier if problem gets worse  Call if blood sugar is less than 70 or consistently above 250    Take a 15 gm snack of carbohydrate at bedtime before you go to sleep if your blood sugar is less than 100.    If you are going to fast after midnight for a test or procedure, ask your physician for instructions on how to reduce/decrease your insulin dose.    Call if blood sugar is less than 70 or consistently above 250  -Treating a low sugar by rule of 15  (15 gms of sugar every 15 min until sugar is more than 70) If you feel your sugar is low, test your sugar to be sure If your sugar is low (less than 70), then take 15 grams of a fast acting Carbohydrate (3-4 glucose tablets or glucose gel or 4 ounces of juice or regular  soda) Recheck your sugar 15 min after treating low to make sure it is more than 70 If sugar is still less than 70, treat again with 15 grams of carbohydrate          Don't drive the hour of hypoglycemia  If unconscious/unable to eat or drink by mouth, use glucagon injection or nasal spray baqsimi and call 911. Can repeat again in 15 min if still unconscious.  Return in about 4 weeks (around 03/18/2023).   I have reviewed current medications, nurse's notes, allergies, vital signs, past medical  and surgical history, family medical history, and social history for this encounter. Counseled patient on symptoms, examination findings, lab findings, imaging results, treatment decisions and monitoring and prognosis. The patient understood the recommendations and agrees with the treatment plan. All questions regarding treatment plan were fully answered.  Miguel [place], MD  02/18/23    History of Present Illness Miguel Harvey is a 77 y.o. year old male who presents for follow up of Type II diabetes mellitus.  Miguel Harvey was first diagnosed in around 2019.   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 +  nephropathy  BLOOD SUGAR DATA 2-3 times a day Did not bring meter 138-260 per recall  Physical Exam  BP 120/62   Pulse 75   Ht 6' (1.829 m)   Wt 220 lb (99.8 kg)   SpO2 97%   BMI 29.84 kg/m    Constitutional: well developed, well nourished Head: normocephalic, atraumatic Eyes: sclera anicteric, no redness Neck: supple Lungs: normal respiratory effort Neurology: alert and oriented Skin: dry, no appreciable rashes Musculoskeletal: no appreciable defects Psychiatric: normal mood and affect Diabetic Foot Exam - Simple   Simple Foot Form Diabetic Foot exam was performed with the following findings: Yes 02/18/2023 11:15 AM  Visual Inspection See comments: Yes Sensation Testing See comments: Yes Pulse Check See comments: Yes Comments R BKA, L toes amputed, L foot old ulcer, weak dorsalis pedis Monofilament 0/3 L foot Wheelchair bound       Current Medications Patient's Medications  New Prescriptions   No medications on file  Previous Medications   ACETAMINOPHEN (TYLENOL) 325 MG TABLET    Take 2 tablets (650 mg total) by mouth every 6 (six) hours as needed for mild pain (or  Fever >/= 101).   ASPIRIN EC 81 MG EC TABLET    Take 1 tablet (81 mg total) by mouth daily.   ATORVASTATIN (LIPITOR) 20 MG TABLET    Take 20 mg by mouth every evening.   CARBOXYMETHYLCELLULOSE (REFRESH PLUS) 0.5 % SOLN    Place 1 drop into both eyes 4 (four) times daily.   CARVEDILOL (COREG) 6.25 MG TABLET    Take 6.25 mg by mouth 2 (two) times daily with a meal.   CLOPIDOGREL (PLAVIX) 75 MG TABLET    Take 1 tablet (75 mg total) by mouth daily.   CYANOCOBALAMIN (B-12 PO)    Take by mouth daily at 2 am.   DULAGLUTIDE (TRULICITY) 1.5 MG/0.5ML SOPN    Inject 1.5 mg into the skin every Friday.   FUROSEMIDE (LASIX) 20 MG TABLET    Take 20 mg by mouth daily.   GUAIFENESIN (ROBITUSSIN) 100 MG/5ML LIQUID    Take 5 mLs by mouth  every 4 (four) hours as needed for cough or to loosen phlegm.   INSULIN GLARGINE, 2 UNIT DIAL, (TOUJEO MAX SOLOSTAR) 300 UNIT/ML SOLOSTAR PEN    Inject 10 Units into the skin daily at 6 (six) AM.   INSULIN LISPRO (HUMALOG) 100 UNIT/ML KWIKPEN    Inject 10-13 Units into the skin 3 (three) times daily. HOLD IF CBG < or = 150   INSULIN LISPRO-AABC (LYUMJEV) 100 UNIT/ML SOLN    Inject 5 Units as directed daily at 6 (six) AM. BASED ON SLIDING SCALE   IPRATROPIUM-ALBUTEROL (DUONEB) 0.5-2.5 (3) MG/3ML SOLN    Take by nebulization as needed.   LATANOPROST (XALATAN) 0.005 % OPHTHALMIC SOLUTION    Place 1 drop into both eyes at bedtime.   LINAGLIPTIN (TRADJENTA) 5 MG TABS TABLET    Take 5 mg by mouth daily.   LISINOPRIL (ZESTRIL) 5 MG TABLET    Take 5 mg by mouth daily.   MAGNESIUM 400 MG TABS    Take 400 mg by mouth 2 (two) times daily.    MULTIPLE VITAMIN (MULTIVITAMIN WITH MINERALS) TABS TABLET    Take 1 tablet by mouth daily.   SERTRALINE (ZOLOFT) 50 MG TABLET    Take 50 mg by mouth daily.   TRAZODONE (DESYREL) 50 MG TABLET    Take 0.5 mg by mouth at bedtime.  Modified Medications   Modified Medication Previous Medication   CONTINUOUS GLUCOSE RECEIVER (DEXCOM G7 RECEIVER) DEVI  Continuous Glucose Receiver (DEXCOM G7 RECEIVER) DEVI      1 Device by Does not apply route continuous.    1 Device by Does not apply route continuous.   CONTINUOUS GLUCOSE SENSOR (DEXCOM G7 SENSOR) MISC Continuous Glucose Sensor (DEXCOM G7 SENSOR) MISC      1 Device by Does not apply route continuous.    1 Device by Does not apply route continuous.  Discontinued Medications   No medications on file    Allergies Allergies  Allergen Reactions   Spironolactone     Hyperkalemia (currently takes spironolactone)    Past Medical History Past Medical History:  Diagnosis Date   Abnormal posture    CHF (congestive heart failure) (HCC)    Chronic kidney disease    CKD   stage 2   Complication of anesthesia    heart stopped x2 during toe amputation   COPD (chronic obstructive pulmonary disease) (HCC)    Coronary artery disease, non-occlusive    a. LHC 05/2011: 10-20% ostial LAD stenosis   Depression    Diabetes mellitus    HLD (hyperlipidemia)    Hypertension    Muscle weakness    Neuromuscular disorder (HCC)    neuropathy   NICM (nonischemic cardiomyopathy) (HCC)    a. TTE 2/213: EF 50-55%, mild LVH, normal wall motion, Gr2DD, mildly dilated LA; b. TTE 2/13: EF 40-45%, mild concentric LVH, global HK, no evidence of LV thrombus; c. TTE 4/19: EF 40%, diffuse HK, Gr2DD, mild MR, mildly dilated LA, RV cavity size normal w/ normal RVSF, PASP 63; d. TTE 4/19: EF 25-30%, diffuse HK, mild LVH, mild MR, mild PASP; e. TTE 5/19: EF 20-25%, diffuse HK, moderate MR,   NSVT (nonsustained ventricular tachycardia) (HCC)    PAD (peripheral artery disease) (HCC)    a. s/p right BKA s/p  recent balloon angioplasty fo the left peroneal artery, left posterior tibial artery, tibioperoneal trunk, and stenting to the distal left SFA in 07/2017   PEA (Pulseless electrical activity) (HCC)    heart stopped beating  during surgery for amputation of toes    Past Surgical History Past Surgical History:  Procedure  Laterality Date   AMPUTATION Right 07/02/2017   Procedure: RIGHT BELOW KNEE AMPUTATION;  Surgeon: Nadara Mustard, MD;  Location: [facility] OR;  Service: Orthopedics;  Laterality: Right;   AMPUTATION TOE Left 05/11/2021   Procedure: LEFT GREAT  TOE AMPUTATION;  Surgeon: Gwyneth Revels, DPM;  Location: [facility] ORS;  Service: Podiatry;  Laterality: Left;   CARDIAC CATHETERIZATION     CATARACT EXTRACTION W/PHACO Left 11/08/2018   Procedure: CATARACT EXTRACTION PHACO AND INTRAOCULAR LENS PLACEMENT (IOC)  LEFT VISION BLUE;  Surgeon: Elliot Cousin, MD;  Location: [facility] ORS;  Service: Ophthalmology;  Laterality: Left;  [place] 03:38.9 CDE 42.37 Fluid pack lot # [id number]   cyst removal from  right leg about 6 years ago     IRRIGATION AND DEBRIDEMENT FOOT Left 08/10/2017   Procedure: IRRIGATION AND DEBRIDEMENT FOOT;  Surgeon: Gwyneth Revels, DPM;  Location: [facility] ORS;  Service: Podiatry;  Laterality: Left;   LEFT HEART CATHETERIZATION WITH CORONARY ANGIOGRAM N/A 05/11/2011   Procedure: LEFT HEART CATHETERIZATION WITH CORONARY ANGIOGRAM;  Surgeon: Lennette Bihari, MD;  Location: [facility] CATH LAB;  Service: Cardiovascular;  Laterality: N/A;   LOWER EXTREMITY ANGIOGRAPHY Left 08/13/2017   Procedure: Lower Extremity Angiography and PICC line placement;  Surgeon: Annice Needy, MD;  Location: [facility] INVASIVE CV LAB;  Service: Cardiovascular;  Laterality: Left;   ruptured navel     repair of   TOE AMPUTATION     not sure of when this surgery occured, but his heart stopped beating x 2 during surgery    Family History family history includes CAD in an other family member; Diabetes in an other family member.  Social History Social History   Socioeconomic History   Marital status: Single    Spouse name: danielle .... daughter   Number of children: Not on file   Years of education: Not on file   Highest education level: Not on file  Occupational History   Not on file  Tobacco Use   Smoking status: Every Day    Current packs/day:  0.00    Types: Cigarettes   Smokeless tobacco: Former    Quit date: 05/07/2011   Tobacco comments:    3 cigs per day (previous hx of smoking 1 PPD)  Vaping Use   Vaping status: Never Used  Substance and Sexual Activity   Alcohol use: Not Currently    Alcohol/week: 6.0 standard drinks of alcohol    Types: 6 Cans of beer per week   Drug use: Never   Sexual activity: Not on file  Other Topics Concern   Not on file  Social History Narrative   Not on file   Social Determinants of Health   Financial Resource Strain: Not on file  Food Insecurity: Not on file  Transportation Needs: Not on file  Physical Activity: Inactive (08/05/2017)   Exercise Vital Sign    Days of Exercise per Week: 0 days    Minutes of Exercise per Session: 0 min  Stress: No Stress Concern Present (08/05/2017)   [employer] of Occupational Health - Occupational Stress Questionnaire    Feeling of Stress : Not at all  Social Connections: Not on file  Intimate Partner Violence: Not on file    Lab Results  Component Value Date   HGBA1C 9.8 (H) 12/21/2022   HGBA1C 9.1 (H) 05/08/2021   HGBA1C 9.1 (H) 11/17/2020   Lab Results  Component Value Date   CHOL 179 12/21/2022   Lab Results  Component Value Date   HDL 37.30 (L) 12/21/2022   Lab Results  Component Value Date   LDLCALC 116 (H) 12/21/2022   Lab Results  Component Value Date   TRIG 129.0 12/21/2022   Lab Results  Component Value Date   CHOLHDL 5 12/21/2022   Lab Results  Component Value Date   CREATININE 1.32 12/21/2022   Lab Results  Component Value Date   GFR 51.95 (L) 12/21/2022   Lab Results  Component Value Date   MICROALBUR 4.4 (H) 12/21/2022      Component Value Date/Time   NA 140 12/21/2022 0910   NA 141 11/07/2019 1439   K 4.4 12/21/2022 0910   CL 106 12/21/2022 0910   CO2 24 12/21/2022 0910   GLUCOSE 207 (H) 12/21/2022 0910   BUN 31 (H) 12/21/2022 0910   BUN 14 11/07/2019 1439   CREATININE 1.32 12/21/2022 0910    CREATININE 1.43 (H) 09/21/2012 1150   CALCIUM 9.0 12/21/2022 0910   PROT 6.4 12/21/2022 0910   ALBUMIN 3.7 12/21/2022 0910   AST 12 12/21/2022 0910   ALT 16 12/21/2022 0910   ALKPHOS 76 12/21/2022 0910   BILITOT 0.2 12/21/2022 0910   GFRNONAA >60 05/10/2021 0322   GFRAA 73 11/07/2019 1439      Latest Ref Rng & Units 12/21/2022    9:10 AM 05/10/2021    3:22 AM 05/09/2021    5:27 AM  BMP  Glucose 70 - 99 mg/dL 161  096  045   BUN 6 - 23 mg/dL 31  15  16    Creatinine 0.40 - 1.50 mg/dL 4.09  8.11  9.14   Sodium 135 - 145 mEq/L 140  135  133   Potassium 3.5 - 5.1 mEq/L 4.4  3.7  3.6   Chloride 96 - 112 mEq/L 106  104  104   CO2 19 - 32 mEq/L 24  22  22    Calcium 8.4 - 10.5 mg/dL 9.0  8.2  7.8        Component Value Date/Time   WBC 10.5 05/10/2021 0322   RBC 3.67 (L) 05/10/2021 0322   HGB 9.9 (L) 05/10/2021 0322   HGB 11.6 (L) 11/07/2019 1439   HCT 30.9 (L) 05/10/2021 0322   HCT 35.7 (L) 11/07/2019 1439   PLT 261 05/10/2021 0322   PLT 288 11/07/2019 1439   MCV 84.2 05/10/2021 0322   MCV 89 11/07/2019 1439   MCH 27.0 05/10/2021 0322   MCHC 32.0 05/10/2021 0322   RDW 13.7 05/10/2021 0322   RDW 13.6 11/07/2019 1439   LYMPHSABS 0.9 05/08/2021 1230   LYMPHSABS 1.9 10/14/2017 0946   MONOABS 0.6 05/08/2021 1230   EOSABS 0.0 05/08/2021 1230   EOSABS 0.3 10/14/2017 0946   BASOSABS 0.0 05/08/2021 1230   BASOSABS 0.0 10/14/2017 0946     Parts of this note may have been dictated using voice recognition software. There may be variances in spelling and vocabulary which are unintentional. Not all errors are proofread. Please notify the Thereasa Parkin if any discrepancies are noted or if the meaning of any statement is not clear.

## 2023-02-23 ENCOUNTER — Telehealth: Payer: Self-pay

## 2023-02-23 NOTE — Telephone Encounter (Signed)
His healthcare facility is calling stating that he cannot receive the Dexcom G7 that has been prescribed for him his insurance declined and daughter states she can't afford it

## 2023-03-11 ENCOUNTER — Encounter: Payer: Self-pay | Admitting: Ophthalmology

## 2023-03-11 ENCOUNTER — Other Ambulatory Visit: Payer: Self-pay

## 2023-03-12 ENCOUNTER — Encounter: Payer: Self-pay | Admitting: Ophthalmology

## 2023-03-12 NOTE — Anesthesia Preprocedure Evaluation (Addendum)
Anesthesia Evaluation  Patient identified by MRN, date of birth, ID band Patient awake    Reviewed: Allergy & Precautions, H&P , NPO status , Patient's Chart, lab work & pertinent test results  History of Anesthesia Complications (+) history of anesthetic complications  Airway Mallampati: III  TM Distance: >3 FB Neck ROM: Full    Dental no notable dental hx. (+) Edentulous Upper, Edentulous Lower   Pulmonary COPD, Current Smoker and Patient abstained from smoking.   Pulmonary exam normal breath sounds clear to auscultation       Cardiovascular hypertension, + CAD, + Peripheral Vascular Disease and +CHF  Normal cardiovascular exam Rhythm:Regular Rate:Normal  05-25-19    1. Left ventricular ejection fraction, by estimation, is 50 to 55%. The  left ventricle has low normal function. The left ventricle has no regional  wall motion abnormalities. There is moderate left ventricular hypertrophy.  Left ventricular diastolic  parameters are consistent with Grade I diastolic dysfunction (impaired  relaxation).   2. Right ventricular systolic function is normal. The right ventricular  size is normal. Tricuspid regurgitation signal is inadequate for assessing  PA pressure.  PAD s/p right BKA s/p balloon angioplasty to the left peroneal artery, left posterior tibial artery, tibioperoneal trunk, and stenting of the distal left SFA in 07/2017 Nonobstructive coronary artery disease,  cardiomyopathy ejection fraction 25% on echocardiogram,  smoker hyperlipidemia, diabetes, hypertension, presenting to the hospital with diabetic foot ulcer/infection, Has prior below-knee amputation right lower extremity one month ago in [place] Long-standing ulceration left foot with no healing Does not ambulate much at baseline Started on Zosyn and vancomycin, Was being prepped for debridement left foot with Dr. Ether Griffins PEA arrest Heart stopped x 2 during  preparation for toe amptation surgery, per notes from Epic   Neuro/Psych  PSYCHIATRIC DISORDERS  Depression     Neuromuscular disease negative neurological ROS  negative psych ROS   GI/Hepatic negative GI ROS, Neg liver ROS,,,  Endo/Other  diabetes    Renal/GU Renal diseasenegative Renal ROS  negative genitourinary   Musculoskeletal negative musculoskeletal ROS (+)    Abdominal   Peds negative pediatric ROS (+)  Hematology negative hematology ROS (+)   Anesthesia Other Findings Hypertension  Uncontrolled  type II Diabetes mellitus NICM (nonischemic cardiomyopathy) HLD (hyperlipidemia) Coronary artery disease, non-occlusive NSVT (nonsustained ventricular tachycardia) (HCC) Grade I diastolic dysfunction PEA (Pulseless electrical activity) \PAD (peripheral artery disease) (HCC) CHF (congestive heart failure) (HCC)Depression Chronic kidney disease  Muscle weakness Abnormal posture  Neuromuscular disorder (HCC) COPD (chronic obstructive pulmonary disease)   Complication of anesthesia Hx coded x 2 during prep for toe amputation surgery.    Reproductive/Obstetrics negative OB ROS                             Anesthesia Physical Anesthesia Plan  ASA: 4  Anesthesia Plan: MAC   Post-op Pain Management:    Induction: Intravenous  PONV Risk Score and Plan:   Airway Management Planned: Natural Airway and Nasal Cannula  Additional Equipment:   Intra-op Plan:   Post-operative Plan:   Informed Consent: I have reviewed the patients History and Physical, chart, labs and discussed the procedure including the risks, benefits and alternatives for the proposed anesthesia with the patient or authorized representative who has indicated his/her understanding and acceptance.     Dental Advisory Given  Plan Discussed with: Anesthesiologist, CRNA and Surgeon  Anesthesia Plan Comments: (Patient consented for risks of anesthesia including but  not  limited to:  - adverse reactions to medications - damage to eyes, teeth, lips or other oral mucosa - nerve damage due to positioning  - sore throat or hoarseness - Damage to heart, brain, nerves, lungs, other parts of body or loss of life  Patient voiced understanding and assent.)        Anesthesia Quick Evaluation

## 2023-03-17 NOTE — Discharge Instructions (Signed)

## 2023-03-19 ENCOUNTER — Ambulatory Visit: Payer: 59 | Admitting: "Endocrinology

## 2023-03-25 ENCOUNTER — Encounter
Admission: RE | Disposition: A | Discharge status: Home / Self Care | Payer: Self-pay | Source: Home / Self Care | Attending: Ophthalmology

## 2023-03-25 ENCOUNTER — Encounter: Payer: Self-pay | Admitting: Ophthalmology

## 2023-03-25 ENCOUNTER — Ambulatory Visit
Admission: RE | Admit: 2023-03-25 | Discharge: 2023-03-25 | Disposition: A | Discharge status: Home / Self Care | Payer: 59 | Attending: Ophthalmology | Admitting: Ophthalmology

## 2023-03-25 ENCOUNTER — Other Ambulatory Visit: Payer: Self-pay

## 2023-03-25 ENCOUNTER — Ambulatory Visit: Payer: 59 | Admitting: Anesthesiology

## 2023-03-25 DIAGNOSIS — Z794 Long term (current) use of insulin: Secondary | ICD-10-CM | POA: Diagnosis not present

## 2023-03-25 DIAGNOSIS — F17210 Nicotine dependence, cigarettes, uncomplicated: Secondary | ICD-10-CM | POA: Insufficient documentation

## 2023-03-25 DIAGNOSIS — Z7984 Long term (current) use of oral hypoglycemic drugs: Secondary | ICD-10-CM | POA: Diagnosis not present

## 2023-03-25 DIAGNOSIS — I509 Heart failure, unspecified: Secondary | ICD-10-CM | POA: Insufficient documentation

## 2023-03-25 DIAGNOSIS — I130 Hypertensive heart and chronic kidney disease with heart failure and stage 1 through stage 4 chronic kidney disease, or unspecified chronic kidney disease: Secondary | ICD-10-CM | POA: Diagnosis not present

## 2023-03-25 DIAGNOSIS — N182 Chronic kidney disease, stage 2 (mild): Secondary | ICD-10-CM | POA: Diagnosis not present

## 2023-03-25 DIAGNOSIS — Z7985 Long-term (current) use of injectable non-insulin antidiabetic drugs: Secondary | ICD-10-CM | POA: Insufficient documentation

## 2023-03-25 DIAGNOSIS — Z833 Family history of diabetes mellitus: Secondary | ICD-10-CM | POA: Diagnosis not present

## 2023-03-25 DIAGNOSIS — E1136 Type 2 diabetes mellitus with diabetic cataract: Secondary | ICD-10-CM | POA: Insufficient documentation

## 2023-03-25 DIAGNOSIS — H2511 Age-related nuclear cataract, right eye: Secondary | ICD-10-CM | POA: Diagnosis not present

## 2023-03-25 DIAGNOSIS — Z8249 Family history of ischemic heart disease and other diseases of the circulatory system: Secondary | ICD-10-CM | POA: Insufficient documentation

## 2023-03-25 DIAGNOSIS — E1122 Type 2 diabetes mellitus with diabetic chronic kidney disease: Secondary | ICD-10-CM | POA: Insufficient documentation

## 2023-03-25 HISTORY — DX: Other ill-defined heart diseases: I51.89

## 2023-03-25 HISTORY — PX: CATARACT EXTRACTION W/PHACO: SHX586

## 2023-03-25 LAB — GLUCOSE, CAPILLARY: Glucose-Capillary: 96 mg/dL (ref 70–99)

## 2023-03-25 SURGERY — PHACOEMULSIFICATION, CATARACT, WITH IOL INSERTION
Anesthesia: Monitor Anesthesia Care | Laterality: Right

## 2023-03-25 MED ORDER — SIGHTPATH DOSE#1 BSS IO SOLN
INTRAOCULAR | Status: DC | PRN
  Administered 2023-03-25: 15 mL via INTRAOCULAR

## 2023-03-25 MED ORDER — LIDOCAINE HCL (PF) 2 % IJ SOLN
INTRAOCULAR | Status: DC | PRN
  Administered 2023-03-25: 1 mL via INTRAOCULAR

## 2023-03-25 MED ORDER — MIDAZOLAM HCL 2 MG/2ML IJ SOLN
INTRAMUSCULAR | Status: AC
  Filled 2023-03-25: qty 2

## 2023-03-25 MED ORDER — FENTANYL CITRATE (PF) 100 MCG/2ML IJ SOLN
INTRAMUSCULAR | Status: AC
  Filled 2023-03-25: qty 2

## 2023-03-25 MED ORDER — FENTANYL CITRATE (PF) 100 MCG/2ML IJ SOLN
INTRAMUSCULAR | Status: DC | PRN
  Administered 2023-03-25 (×2): 25 ug via INTRAVENOUS

## 2023-03-25 MED ORDER — SIGHTPATH DOSE#1 NA HYALUR & NA CHOND-NA HYALUR IO KIT
PACK | INTRAOCULAR | Status: DC | PRN
  Administered 2023-03-25: 1 via OPHTHALMIC

## 2023-03-25 MED ORDER — TETRACAINE HCL 0.5 % OP SOLN
OPHTHALMIC | Status: AC
  Filled 2023-03-25: qty 4

## 2023-03-25 MED ORDER — TETRACAINE HCL 0.5 % OP SOLN
1.0000 [drp] | OPHTHALMIC | Status: DC | PRN
  Administered 2023-03-25 (×3): 1 [drp] via OPHTHALMIC

## 2023-03-25 MED ORDER — MOXIFLOXACIN HCL 0.5 % OP SOLN
OPHTHALMIC | Status: DC | PRN
  Administered 2023-03-25: .2 mL via OPHTHALMIC

## 2023-03-25 MED ORDER — MIDAZOLAM HCL 2 MG/2ML IJ SOLN
INTRAMUSCULAR | Status: DC | PRN
  Administered 2023-03-25 (×2): 1 mg via INTRAVENOUS

## 2023-03-25 MED ORDER — ARMC OPHTHALMIC DILATING DROPS
OPHTHALMIC | Status: AC
  Filled 2023-03-25: qty 0.5

## 2023-03-25 MED ORDER — BRIMONIDINE TARTRATE-TIMOLOL 0.2-0.5 % OP SOLN
OPHTHALMIC | Status: DC | PRN
  Administered 2023-03-25: 1 [drp] via OPHTHALMIC

## 2023-03-25 MED ORDER — ARMC OPHTHALMIC DILATING DROPS
1.0000 | OPHTHALMIC | Status: DC | PRN
  Administered 2023-03-25 (×3): 1 via OPHTHALMIC

## 2023-03-25 MED ORDER — PHENYLEPHRINE-KETOROLAC 1-0.3 % IO SOLN
INTRAOCULAR | Status: DC | PRN
  Administered 2023-03-25: 116 mL via OPHTHALMIC

## 2023-03-25 SURGICAL SUPPLY — 10 items
CATARACT SUITE SIGHTPATH (MISCELLANEOUS) ×1 IMPLANT
DISSECTOR HYDRO NUCLEUS 50X22 (MISCELLANEOUS) ×2 IMPLANT
DRSG TEGADERM 2-3/8X2-3/4 SM (GAUZE/BANDAGES/DRESSINGS) ×2 IMPLANT
FEE CATARACT SUITE SIGHTPATH (MISCELLANEOUS) ×2 IMPLANT
GLOVE SURG SYN 7.5 E (GLOVE) ×1 IMPLANT
GLOVE SURG SYN 7.5 PF PI (GLOVE) ×2 IMPLANT
GLOVE SURG SYN 8.5 E (GLOVE) ×1 IMPLANT
GLOVE SURG SYN 8.5 PF PI (GLOVE) ×2 IMPLANT
LENS CLAREON WAGON WHEEL 21.0 (Intraocular Lens) ×1 IMPLANT
LENS IOL CLRN WGN WHL 21.0 (Intraocular Lens) IMPLANT

## 2023-03-25 NOTE — H&P (Signed)
[facility]   Primary Care Physician:  [name], MD Ophthalmologist: Dr. Deberah Pelton  Pre-Procedure History & Physical: HPI:  Miguel Harvey is a 77 y.o. male here for cataract surgery.   Past Medical History:  Diagnosis Date   Abnormal posture    CHF (congestive heart failure) (HCC)    Chronic kidney disease    CKD   stage 2   Complication of anesthesia    heart stopped x2 during toe amputation   COPD (chronic obstructive pulmonary disease) (HCC)    Coronary artery disease, non-occlusive    a. LHC 05/2011: 10-20% ostial LAD stenosis   Depression    Diabetes mellitus    Grade I diastolic dysfunction    HLD (hyperlipidemia)    Hypertension    Muscle weakness    Neuromuscular disorder (HCC)    neuropathy   NICM (nonischemic cardiomyopathy) (HCC)    a. TTE 2/213: EF 50-55%, mild LVH, normal wall motion, Gr2DD, mildly dilated LA; b. TTE 2/13: EF 40-45%, mild concentric LVH, global HK, no evidence of LV thrombus; c. TTE 4/19: EF 40%, diffuse HK, Gr2DD, mild MR, mildly dilated LA, RV cavity size normal w/ normal RVSF, PASP 63; d. TTE 4/19: EF 25-30%, diffuse HK, mild LVH, mild MR, mild PASP; e. TTE 5/19: EF 20-25%, diffuse HK, moderate MR,   NSVT (nonsustained ventricular tachycardia) (HCC)    PAD (peripheral artery disease) (HCC)    a. s/p right BKA s/p  recent balloon angioplasty fo the left peroneal artery, left posterior tibial artery, tibioperoneal trunk, and stenting to the distal left SFA in 07/2017   PEA (Pulseless electrical activity) (HCC)    heart stopped beating during surgery for amputation of toes    Past Surgical History:  Procedure Laterality Date   AMPUTATION Right 07/02/2017   Procedure: RIGHT BELOW KNEE AMPUTATION;  Surgeon: Nadara Mustard, MD;  Location: [facility] OR;  Service: Orthopedics;  Laterality: Right;   AMPUTATION TOE Left 05/11/2021   Procedure: LEFT GREAT  TOE AMPUTATION;  Surgeon: Gwyneth Revels, DPM;  Location: [facility] ORS;  Service: Podiatry;   Laterality: Left;   CARDIAC CATHETERIZATION     CATARACT EXTRACTION W/PHACO Left 11/08/2018   Procedure: CATARACT EXTRACTION PHACO AND INTRAOCULAR LENS PLACEMENT (IOC)  LEFT VISION BLUE;  Surgeon: Elliot Cousin, MD;  Location: [facility] ORS;  Service: Ophthalmology;  Laterality: Left;  [place] 03:38.9 CDE 42.37 Fluid pack lot # [id number]   cyst removal from  right leg about 6 years ago     IRRIGATION AND DEBRIDEMENT FOOT Left 08/10/2017   Procedure: IRRIGATION AND DEBRIDEMENT FOOT;  Surgeon: Gwyneth Revels, DPM;  Location: [facility] ORS;  Service: Podiatry;  Laterality: Left;   LEFT HEART CATHETERIZATION WITH CORONARY ANGIOGRAM N/A 05/11/2011   Procedure: LEFT HEART CATHETERIZATION WITH CORONARY ANGIOGRAM;  Surgeon: Lennette Bihari, MD;  Location: [facility] CATH LAB;  Service: Cardiovascular;  Laterality: N/A;   LOWER EXTREMITY ANGIOGRAPHY Left 08/13/2017   Procedure: Lower Extremity Angiography and PICC line placement;  Surgeon: Annice Needy, MD;  Location: [facility] INVASIVE CV LAB;  Service: Cardiovascular;  Laterality: Left;   ruptured navel     repair of   TOE AMPUTATION     not sure of when this surgery occured, but his heart stopped beating x 2 during surgery    Prior to Admission medications   Medication Sig Start Date End Date Taking? Authorizing Provider  acetaminophen (TYLENOL) 325 MG tablet Take 2 tablets (650 mg total) by mouth every 6 (six)  hours as needed for mild pain (or Fever >/= 101). Patient not taking: Reported on 12/28/2022 05/14/21   Leeroy Bock, MD  aspirin EC 81 MG EC tablet Take 1 tablet (81 mg total) by mouth daily. Patient not taking: Reported on 12/28/2022 08/14/17   Enedina Finner, MD  atorvastatin (LIPITOR) 20 MG tablet Take 20 mg by mouth every evening.    [provider]  carboxymethylcellulose (REFRESH PLUS) 0.5 % SOLN Place 1 drop into both eyes 4 (four) times daily.    [provider]  carvedilol (COREG) 6.25 MG tablet Take 6.25 mg by mouth 2 (two) times daily with a  meal. Patient not taking: Reported on 12/28/2022    [provider]  clopidogrel (PLAVIX) 75 MG tablet Take 1 tablet (75 mg total) by mouth daily. 08/14/17   Enedina Finner, MD  Continuous Glucose Receiver (DEXCOM G7 RECEIVER) DEVI 1 Device by Does not apply route continuous. 02/18/23   Motwani, Carin Hock, MD  Continuous Glucose Sensor (DEXCOM G7 SENSOR) MISC 1 Device by Does not apply route continuous. 02/18/23   Altamese Anthony, MD  Cyanocobalamin (B-12 PO) Take by mouth daily at 2 am.    [provider]  Dulaglutide (TRULICITY) 1.5 MG/0.5ML SOPN Inject 1.5 mg into the skin every Friday. Patient not taking: Reported on 12/28/2022    [provider]  furosemide (LASIX) 20 MG tablet Take 20 mg by mouth daily.    [provider]  guaiFENesin (ROBITUSSIN) 100 MG/5ML liquid Take 5 mLs by mouth every 4 (four) hours as needed for cough or to loosen phlegm. Patient not taking: Reported on 12/28/2022    [provider]  insulin glargine, 2 Unit Dial, (TOUJEO MAX SOLOSTAR) 300 UNIT/ML Solostar Pen Inject 10 Units into the skin daily at 6 (six) AM.    [provider]  insulin lispro (HUMALOG) 100 UNIT/ML KwikPen Inject 10-13 Units into the skin 3 (three) times daily. HOLD IF CBG < or = 150 04/05/19   [provider]  Insulin Lispro-aabc (LYUMJEV) 100 UNIT/ML SOLN Inject 5 Units as directed daily at 6 (six) AM. BASED ON SLIDING SCALE    [provider]  ipratropium-albuterol (DUONEB) 0.5-2.5 (3) MG/3ML SOLN Take by nebulization as needed. Patient not taking: Reported on 12/28/2022 08/26/21   [provider]  latanoprost (XALATAN) 0.005 % ophthalmic solution Place 1 drop into both eyes at bedtime.    [provider]  linagliptin (TRADJENTA) 5 MG TABS tablet Take 5 mg by mouth daily.    [provider]  lisinopril (ZESTRIL) 5 MG tablet Take 5 mg by mouth daily.    [provider]  Magnesium 400 MG TABS Take 400 mg by  mouth 2 (two) times daily.     [provider]  Multiple Vitamin (MULTIVITAMIN WITH MINERALS) TABS tablet Take 1 tablet by mouth daily.    [provider]  sertraline (ZOLOFT) 50 MG tablet Take 50 mg by mouth daily.    [provider]  traZODone (DESYREL) 50 MG tablet Take 0.5 mg by mouth at bedtime. Patient not taking: Reported on 02/18/2023    [provider]    Allergies as of 02/09/2023 - Review Complete 01/25/2023  Allergen Reaction Noted   Spironolactone  01/25/2018    Family History  Problem Relation Age of Onset   CAD Other    Diabetes Other     Social History   Socioeconomic History   Marital status: Single    Spouse name: danielle .Marland KitchenMarland KitchenMarland Kitchen  daughter   Number of children: Not on file   Years of education: Not on file   Highest education level: Not on file  Occupational History   Not on file  Tobacco Use   Smoking status: Every Day    Current packs/day: 0.00    Types: Cigarettes   Smokeless tobacco: Former    Quit date: 05/07/2011   Tobacco comments:    3 cigs per day (previous hx of smoking 1 PPD)  Vaping Use   Vaping status: Never Used  Substance and Sexual Activity   Alcohol use: Not Currently    Alcohol/week: 6.0 standard drinks of alcohol    Types: 6 Cans of beer per week   Drug use: Never   Sexual activity: Not on file  Other Topics Concern   Not on file  Social History Narrative   Not on file   Social Drivers of Health   Financial Resource Strain: Not on file  Food Insecurity: Not on file  Transportation Needs: Not on file  Physical Activity: Inactive (08/05/2017)   Exercise Vital Sign    Days of Exercise per Week: 0 days    Minutes of Exercise per Session: 0 min  Stress: No Stress Concern Present (08/05/2017)   [employer] of Occupational Health - Occupational Stress Questionnaire    Feeling of Stress : Not at all  Social Connections: Not on file  Intimate Partner Violence: Not on file    Review of  Systems: See HPI, otherwise negative ROS  Physical Exam: There were no vitals taken for this visit. General:   Alert, cooperative in NAD Head:  Normocephalic and atraumatic. Respiratory:  Normal work of breathing. Cardiovascular:  RRR  Impression/Plan: Miguel Harvey is here for cataract surgery.  Risks, benefits, limitations, and alternatives regarding cataract surgery have been reviewed with the patient.  Questions have been answered.  All parties agreeable.   Estanislado Pandy, MD  03/25/2023, 9:05 AM

## 2023-03-25 NOTE — Transfer of Care (Signed)
Immediate Anesthesia Transfer of Care Note  Patient: Miguel Harvey  Procedure(s) Performed: CATARACT EXTRACTION PHACO AND INTRAOCULAR LENS PLACEMENT (IOC) RIGHT DIABETIC MALYUGIN OMIDRIA 24.82 02:08.6 (Right)  Patient Location: PACU  Anesthesia Type: MAC  Level of Consciousness: awake, alert  and patient cooperative  Airway and Oxygen Therapy: Patient Spontanous Breathing and Patient connected to supplemental oxygen  Post-op Assessment: Post-op Vital signs reviewed, Patient's Cardiovascular Status Stable, Respiratory Function Stable, Patent Airway and No signs of Nausea or vomiting  Post-op Vital Signs: Reviewed and stable  Complications: No notable events documented.

## 2023-03-25 NOTE — Anesthesia Postprocedure Evaluation (Signed)
Anesthesia Post Note  Patient: Miguel Harvey  Procedure(s) Performed: CATARACT EXTRACTION PHACO AND INTRAOCULAR LENS PLACEMENT (IOC) RIGHT DIABETIC MALYUGIN OMIDRIA 24.82 02:08.6 (Right)  Patient location during evaluation: PACU Anesthesia Type: MAC Level of consciousness: awake and alert Pain management: pain level controlled Vital Signs Assessment: post-procedure vital signs reviewed and stable Respiratory status: spontaneous breathing, nonlabored ventilation, respiratory function stable and patient connected to nasal cannula oxygen Cardiovascular status: stable and blood pressure returned to baseline Postop Assessment: no apparent nausea or vomiting Anesthetic complications: no   No notable events documented.   Last Vitals:  Vitals:   03/25/23 1520 03/25/23 1526  BP: 121/77 134/75  Pulse: 71 76  Resp: 14 16  Temp: (!) 36.3 C (!) 36.3 C  SpO2: 100% 98%    Last Pain:  Vitals:   03/25/23 1526  TempSrc:   PainSc: 0-No pain                 Shai Mckenzie C Sherlie Boyum

## 2023-03-25 NOTE — Op Note (Signed)
OPERATIVE NOTE  Miguel Harvey [id number] 03/25/2023   PREOPERATIVE DIAGNOSIS: Nuclear sclerotic cataract right eye. H25.11   POSTOPERATIVE DIAGNOSIS: Nuclear sclerotic cataract right eye. H25.11   PROCEDURE:  Phacoemusification with posterior chamber intraocular lens placement of the right eye  Ultrasound time: Procedure(s): CATARACT EXTRACTION PHACO AND INTRAOCULAR LENS PLACEMENT (IOC) RIGHT DIABETIC MALYUGIN OMIDRIA 24.82 02:08.6 (Right)  LENS:   Implant Name Type Inv. Item Serial No. Manufacturer Lot No. LRB No. Used Action  LENS CL[employer] WAGON WHEEL 21.0 - [id number] Intraocular Lens LENS CL[employer] WAGON WHEEL 21.0 [id number] [employer]  Right 1 Implanted      SURGEON:  Julious Payer. Rolley Sims, MD   ANESTHESIA:  Topical with tetracaine drops, augmented with 1% preservative-free intracameral lidocaine.   COMPLICATIONS:  None.   DESCRIPTION OF PROCEDURE:  The patient was identified in the holding room and transported to the operating room and placed in the supine position under the operating microscope.  The right eye was identified as the operative eye, which was prepped and draped in the usual sterile ophthalmic fashion.   A 1 millimeter clear-corneal paracentesis was made superotemporally. Preservative-free 1% lidocaine mixed with 1:1,000 bisulfite-free aqueous solution of epinephrine was injected into the anterior chamber. The anterior chamber was then filled with Viscoat viscoelastic. A 2.4 millimeter keratome was used to make a clear-corneal incision inferotemporally. A curvilinear capsulorrhexis was made with a cystotome and capsulorrhexis forceps. Balanced salt solution was used to hydrodissect and hydrodelineate the nucleus. Phacoemulsification was then used to remove the lens nucleus and epinucleus. The remaining cortex was then removed using the irrigation and aspiration handpiece. Provisc was then placed into the capsular bag to distend it for lens placement. A +21.00 D [id number]  intraocular lens was then injected into the capsular bag. The remaining viscoelastic was aspirated.   Wounds were hydrated with balanced salt solution.  The anterior chamber was inflated to a physiologic pressure with balanced salt solution.  No wound leaks were noted. Vigamox was injected intracamerally.  Timolol and Brimonidine drops were applied to the eye.  The patient was taken to the recovery room in stable condition without complications of anesthesia or surgery.  [name] 03/25/2023, 3:18 PM

## 2023-03-26 ENCOUNTER — Encounter: Payer: Self-pay | Admitting: Ophthalmology

## 2023-05-10 ENCOUNTER — Inpatient Hospital Stay
Admission: EM | Admit: 2023-05-10 | Discharge: 2023-05-17 | DRG: 574 | Disposition: A | Discharge status: Skilled Nursing Facility | Payer: 59 | Source: Skilled Nursing Facility | Attending: Internal Medicine | Admitting: Internal Medicine

## 2023-05-10 ENCOUNTER — Emergency Department: Payer: 59

## 2023-05-10 ENCOUNTER — Other Ambulatory Visit: Payer: Self-pay

## 2023-05-10 DIAGNOSIS — M869 Osteomyelitis, unspecified: Secondary | ICD-10-CM | POA: Diagnosis present

## 2023-05-10 DIAGNOSIS — L97522 Non-pressure chronic ulcer of other part of left foot with fat layer exposed: Secondary | ICD-10-CM | POA: Diagnosis present

## 2023-05-10 DIAGNOSIS — N1831 Chronic kidney disease, stage 3a: Secondary | ICD-10-CM | POA: Diagnosis present

## 2023-05-10 DIAGNOSIS — Z794 Long term (current) use of insulin: Secondary | ICD-10-CM

## 2023-05-10 DIAGNOSIS — E1129 Type 2 diabetes mellitus with other diabetic kidney complication: Secondary | ICD-10-CM | POA: Diagnosis present

## 2023-05-10 DIAGNOSIS — L03116 Cellulitis of left lower limb: Secondary | ICD-10-CM | POA: Diagnosis not present

## 2023-05-10 DIAGNOSIS — I130 Hypertensive heart and chronic kidney disease with heart failure and stage 1 through stage 4 chronic kidney disease, or unspecified chronic kidney disease: Secondary | ICD-10-CM | POA: Diagnosis present

## 2023-05-10 DIAGNOSIS — J439 Emphysema, unspecified: Secondary | ICD-10-CM

## 2023-05-10 DIAGNOSIS — E1151 Type 2 diabetes mellitus with diabetic peripheral angiopathy without gangrene: Secondary | ICD-10-CM | POA: Diagnosis present

## 2023-05-10 DIAGNOSIS — Z6828 Body mass index (BMI) 28.0-28.9, adult: Secondary | ICD-10-CM

## 2023-05-10 DIAGNOSIS — Z9981 Dependence on supplemental oxygen: Secondary | ICD-10-CM

## 2023-05-10 DIAGNOSIS — Z89511 Acquired absence of right leg below knee: Secondary | ICD-10-CM

## 2023-05-10 DIAGNOSIS — M25462 Effusion, left knee: Secondary | ICD-10-CM

## 2023-05-10 DIAGNOSIS — Z8249 Family history of ischemic heart disease and other diseases of the circulatory system: Secondary | ICD-10-CM

## 2023-05-10 DIAGNOSIS — M25562 Pain in left knee: Secondary | ICD-10-CM | POA: Diagnosis present

## 2023-05-10 DIAGNOSIS — M1712 Unilateral primary osteoarthritis, left knee: Secondary | ICD-10-CM | POA: Diagnosis present

## 2023-05-10 DIAGNOSIS — Z7982 Long term (current) use of aspirin: Secondary | ICD-10-CM

## 2023-05-10 DIAGNOSIS — E1122 Type 2 diabetes mellitus with diabetic chronic kidney disease: Secondary | ICD-10-CM | POA: Diagnosis present

## 2023-05-10 DIAGNOSIS — E663 Overweight: Secondary | ICD-10-CM | POA: Diagnosis present

## 2023-05-10 DIAGNOSIS — Z1152 Encounter for screening for COVID-19: Secondary | ICD-10-CM

## 2023-05-10 DIAGNOSIS — E785 Hyperlipidemia, unspecified: Secondary | ICD-10-CM | POA: Diagnosis not present

## 2023-05-10 DIAGNOSIS — D62 Acute posthemorrhagic anemia: Secondary | ICD-10-CM | POA: Diagnosis not present

## 2023-05-10 DIAGNOSIS — M009 Pyogenic arthritis, unspecified: Secondary | ICD-10-CM

## 2023-05-10 DIAGNOSIS — Z7984 Long term (current) use of oral hypoglycemic drugs: Secondary | ICD-10-CM

## 2023-05-10 DIAGNOSIS — Z89422 Acquired absence of other left toe(s): Secondary | ICD-10-CM

## 2023-05-10 DIAGNOSIS — J449 Chronic obstructive pulmonary disease, unspecified: Secondary | ICD-10-CM | POA: Diagnosis present

## 2023-05-10 DIAGNOSIS — E11621 Type 2 diabetes mellitus with foot ulcer: Secondary | ICD-10-CM | POA: Diagnosis present

## 2023-05-10 DIAGNOSIS — I10 Essential (primary) hypertension: Secondary | ICD-10-CM | POA: Diagnosis present

## 2023-05-10 DIAGNOSIS — E1140 Type 2 diabetes mellitus with diabetic neuropathy, unspecified: Secondary | ICD-10-CM | POA: Diagnosis present

## 2023-05-10 DIAGNOSIS — E871 Hypo-osmolality and hyponatremia: Secondary | ICD-10-CM | POA: Diagnosis present

## 2023-05-10 DIAGNOSIS — F17200 Nicotine dependence, unspecified, uncomplicated: Secondary | ICD-10-CM | POA: Diagnosis present

## 2023-05-10 DIAGNOSIS — F32A Depression, unspecified: Secondary | ICD-10-CM | POA: Diagnosis present

## 2023-05-10 DIAGNOSIS — E860 Dehydration: Secondary | ICD-10-CM | POA: Diagnosis present

## 2023-05-10 DIAGNOSIS — F17210 Nicotine dependence, cigarettes, uncomplicated: Secondary | ICD-10-CM | POA: Diagnosis present

## 2023-05-10 DIAGNOSIS — I5032 Chronic diastolic (congestive) heart failure: Secondary | ICD-10-CM | POA: Diagnosis present

## 2023-05-10 DIAGNOSIS — Z8674 Personal history of sudden cardiac arrest: Secondary | ICD-10-CM

## 2023-05-10 DIAGNOSIS — E1165 Type 2 diabetes mellitus with hyperglycemia: Secondary | ICD-10-CM | POA: Diagnosis present

## 2023-05-10 DIAGNOSIS — Z7902 Long term (current) use of antithrombotics/antiplatelets: Secondary | ICD-10-CM

## 2023-05-10 DIAGNOSIS — I2510 Atherosclerotic heart disease of native coronary artery without angina pectoris: Secondary | ICD-10-CM | POA: Diagnosis present

## 2023-05-10 DIAGNOSIS — Z833 Family history of diabetes mellitus: Secondary | ICD-10-CM

## 2023-05-10 DIAGNOSIS — I493 Ventricular premature depolarization: Secondary | ICD-10-CM | POA: Diagnosis present

## 2023-05-10 LAB — CBC
HCT: 28.9 % — ABNORMAL LOW (ref 39.0–52.0)
Hemoglobin: 9.6 g/dL — ABNORMAL LOW (ref 13.0–17.0)
MCH: 27.7 pg (ref 26.0–34.0)
MCHC: 33.2 g/dL (ref 30.0–36.0)
MCV: 83.3 fL (ref 80.0–100.0)
Platelets: 300 10*3/uL (ref 150–400)
RBC: 3.47 MIL/uL — ABNORMAL LOW (ref 4.22–5.81)
RDW: 13 % (ref 11.5–15.5)
WBC: 13.7 10*3/uL — ABNORMAL HIGH (ref 4.0–10.5)
nRBC: 0 % (ref 0.0–0.2)

## 2023-05-10 LAB — RESP PANEL BY RT-PCR (RSV, FLU A&B, COVID)  RVPGX2
Influenza A by PCR: NEGATIVE
Influenza B by PCR: NEGATIVE
Resp Syncytial Virus by PCR: NEGATIVE
SARS Coronavirus 2 by RT PCR: NEGATIVE

## 2023-05-10 LAB — BASIC METABOLIC PANEL
Anion gap: 11 (ref 5–15)
BUN: 44 mg/dL — ABNORMAL HIGH (ref 8–23)
CO2: 21 mmol/L — ABNORMAL LOW (ref 22–32)
Calcium: 7.6 mg/dL — ABNORMAL LOW (ref 8.9–10.3)
Chloride: 92 mmol/L — ABNORMAL LOW (ref 98–111)
Creatinine, Ser: 1.58 mg/dL — ABNORMAL HIGH (ref 0.61–1.24)
GFR, Estimated: 44 mL/min — ABNORMAL LOW (ref >60)
Glucose, Bld: 245 mg/dL — ABNORMAL HIGH (ref 70–99)
Potassium: 4.1 mmol/L (ref 3.5–5.1)
Sodium: 124 mmol/L — ABNORMAL LOW (ref 135–145)

## 2023-05-10 LAB — CBG MONITORING, ED: Glucose-Capillary: 221 mg/dL — ABNORMAL HIGH (ref 70–99)

## 2023-05-10 LAB — GLUCOSE, CAPILLARY: Glucose-Capillary: 331 mg/dL — ABNORMAL HIGH (ref 70–99)

## 2023-05-10 LAB — LACTIC ACID, PLASMA: Lactic Acid, Venous: 1.5 mmol/L (ref 0.5–1.9)

## 2023-05-10 MED ORDER — INSULIN ASPART 100 UNIT/ML IJ SOLN
0.0000 [IU] | Freq: Every day | INTRAMUSCULAR | Status: DC
  Administered 2023-05-10: 4 [IU] via SUBCUTANEOUS
  Administered 2023-05-12: 3 [IU] via SUBCUTANEOUS
  Administered 2023-05-13: 2 [IU] via SUBCUTANEOUS
  Administered 2023-05-15: 3 [IU] via SUBCUTANEOUS
  Filled 2023-05-10 (×4): qty 1

## 2023-05-10 MED ORDER — INSULIN ASPART 100 UNIT/ML IJ SOLN
0.0000 [IU] | Freq: Three times a day (TID) | INTRAMUSCULAR | Status: DC
  Administered 2023-05-11: 2 [IU] via SUBCUTANEOUS
  Administered 2023-05-11 (×2): 7 [IU] via SUBCUTANEOUS
  Administered 2023-05-12 (×2): 3 [IU] via SUBCUTANEOUS
  Administered 2023-05-12 – 2023-05-13 (×2): 5 [IU] via SUBCUTANEOUS
  Administered 2023-05-13: 3 [IU] via SUBCUTANEOUS
  Administered 2023-05-13: 5 [IU] via SUBCUTANEOUS
  Administered 2023-05-14: 3 [IU] via SUBCUTANEOUS
  Administered 2023-05-14: 5 [IU] via SUBCUTANEOUS
  Administered 2023-05-15: 2 [IU] via SUBCUTANEOUS
  Administered 2023-05-15: 3 [IU] via SUBCUTANEOUS
  Administered 2023-05-15: 1 [IU] via SUBCUTANEOUS
  Administered 2023-05-16 (×2): 5 [IU] via SUBCUTANEOUS
  Administered 2023-05-16 – 2023-05-17 (×2): 2 [IU] via SUBCUTANEOUS
  Administered 2023-05-17: 5 [IU] via SUBCUTANEOUS
  Filled 2023-05-10 (×19): qty 1

## 2023-05-10 MED ORDER — SODIUM CHLORIDE 0.9 % IV BOLUS
1000.0000 mL | Freq: Once | INTRAVENOUS | Status: AC
  Administered 2023-05-10: 1000 mL via INTRAVENOUS

## 2023-05-10 MED ORDER — HYDRALAZINE HCL 20 MG/ML IJ SOLN: 5.0000 mg | INTRAMUSCULAR | Status: DC | PRN

## 2023-05-10 MED ORDER — HEPARIN SODIUM (PORCINE) 5000 UNIT/ML IJ SOLN
5000.0000 [IU] | Freq: Three times a day (TID) | INTRAMUSCULAR | Status: DC
  Administered 2023-05-10 – 2023-05-17 (×18): 5000 [IU] via SUBCUTANEOUS
  Filled 2023-05-10 (×19): qty 1

## 2023-05-10 MED ORDER — SODIUM CHLORIDE 1 G PO TABS
1.0000 g | ORAL_TABLET | Freq: Two times a day (BID) | ORAL | Status: DC
  Administered 2023-05-10 – 2023-05-17 (×13): 1 g via ORAL
  Filled 2023-05-10 (×13): qty 1

## 2023-05-10 MED ORDER — VANCOMYCIN HCL IN DEXTROSE 1-5 GM/200ML-% IV SOLN
1000.0000 mg | Freq: Once | INTRAVENOUS | Status: AC
  Administered 2023-05-11: 1000 mg via INTRAVENOUS
  Filled 2023-05-10: qty 200

## 2023-05-10 MED ORDER — ACETAMINOPHEN 325 MG PO TABS: 650.0000 mg | ORAL_TABLET | Freq: Four times a day (QID) | ORAL | Status: DC | PRN

## 2023-05-10 MED ORDER — NICOTINE 21 MG/24HR TD PT24
21.0000 mg | MEDICATED_PATCH | Freq: Every day | TRANSDERMAL | Status: DC
  Filled 2023-05-10 (×6): qty 1

## 2023-05-10 MED ORDER — ALBUTEROL SULFATE (2.5 MG/3ML) 0.083% IN NEBU: 2.5000 mg | INHALATION_SOLUTION | RESPIRATORY_TRACT | Status: DC | PRN

## 2023-05-10 MED ORDER — DM-GUAIFENESIN ER 30-600 MG PO TB12
1.0000 | ORAL_TABLET | Freq: Two times a day (BID) | ORAL | Status: DC | PRN
  Administered 2023-05-16 – 2023-05-17 (×2): 1 via ORAL
  Filled 2023-05-10 (×2): qty 1

## 2023-05-10 MED ORDER — VANCOMYCIN HCL IN DEXTROSE 1-5 GM/200ML-% IV SOLN
1000.0000 mg | Freq: Once | INTRAVENOUS | Status: AC
  Administered 2023-05-10: 1000 mg via INTRAVENOUS
  Filled 2023-05-10: qty 200

## 2023-05-10 MED ORDER — SODIUM CHLORIDE 0.9 % IV SOLN
2.0000 g | INTRAVENOUS | Status: DC
  Administered 2023-05-11 – 2023-05-16 (×7): 2 g via INTRAVENOUS
  Filled 2023-05-10 (×7): qty 20

## 2023-05-10 MED ORDER — ONDANSETRON HCL 4 MG/2ML IJ SOLN: 4.0000 mg | Freq: Three times a day (TID) | INTRAMUSCULAR | Status: DC | PRN

## 2023-05-10 NOTE — ED Provider Notes (Signed)
 [facility] Provider Note    Event Date/Time   First MD Initiated Contact with Patient 05/10/23 1902     (approximate)   History   Weakness   HPI  Miguel Harvey is a 78 y.o. male with a history of CHF, CKD, COPD, CAD, diabetes, hypertension, neuropathy, and PAD who presents with worsening generalized weakness over the last few days.  The patient himself denies any acute complaints.  He states that he thinks he has an infection in his left leg due to an insulin  shot.  He denies other acute complaints.  Per EMS, the patient was diagnosed with cellulitis and was on antibiotic.  He had a low-grade temperature earlier today.  I reviewed the past medical records per the patient's most recent outpatient encounter was with Dr. Donalda Fruit from ophthalmology on 12/26 for an ocular procedure.  He has no recent ED visits and was last hospitalized in 2023 with osteomyelitis of the left great toe.   Physical Exam   Triage Vital Signs: ED Triage Vitals  Encounter Vitals Group     BP 05/10/23 1514 116/78     Systolic BP Percentile --      Diastolic BP Percentile --      Pulse Rate 05/10/23 1514 87     Resp 05/10/23 1514 19     Temp 05/10/23 1514 97.8 F (36.6 C)     Temp Source 05/10/23 1514 Oral     SpO2 05/10/23 1514 100 %     Weight --      Height --      Head Circumference --      Peak Flow --      Pain Score 05/10/23 1500 5     Pain Loc --      Pain Education --      Exclude from Growth Chart --     Most recent vital signs: Vitals:   05/10/23 1854 05/10/23 2253  BP: 139/77 121/68  Pulse: 89 77  Resp: 18 17  Temp:    SpO2: 100% 98%     General: Alert, orient x 1, no distress.  CV:  Good peripheral perfusion.  Resp:  Normal effort.  Abd:  No distention.  Other:  Patient lying with the left hip abducted so I am unable to fully examine the lateral leg.  Visible and palpable left leg with no erythema, induration, or abnormal warmth.  There is a dry  appearing wound to the lateral plantar aspect of the left foot with no drainage.   ED Results / Procedures / Treatments   Labs (all labs ordered are listed, but only abnormal results are displayed) Labs Reviewed  BASIC METABOLIC PANEL - Abnormal; Notable for the following components:      Result Value   Sodium 124 (*)    Chloride 92 (*)    CO2 21 (*)    Glucose, Bld 245 (*)    BUN 44 (*)    Creatinine, Ser 1.58 (*)    Calcium  7.6 (*)    GFR, Estimated 44 (*)    All other components within normal limits  CBC - Abnormal; Notable for the following components:   WBC 13.7 (*)    RBC 3.47 (*)    Hemoglobin 9.6 (*)    HCT 28.9 (*)    All other components within normal limits  CBG MONITORING, ED - Abnormal; Notable for the following components:   Glucose-Capillary 221 (*)    All other components  within normal limits  RESP PANEL BY RT-PCR (RSV, FLU A&B, COVID)  RVPGX2  CULTURE, BLOOD (ROUTINE X 2)  CULTURE, BLOOD (ROUTINE X 2)  LACTIC ACID, PLASMA  URINALYSIS, ROUTINE W REFLEX MICROSCOPIC  MAGNESIUM   BRAIN NATRIURETIC PEPTIDE  OSMOLALITY  OSMOLALITY, URINE  SODIUM, URINE, RANDOM  SEDIMENTATION RATE  C-REACTIVE PROTEIN  BASIC METABOLIC PANEL  CBC     EKG  ED ECG REPORT I, Lind Repine, the attending physician, personally viewed and interpreted this ECG.  Date: 05/10/2023 EKG Time: 1512 Rate: 88 Rhythm: normal sinus rhythm QRS Axis: normal Intervals: normal ST/T Wave abnormalities: Nonspecific T wave abnormalities Narrative Interpretation: no evidence of acute ischemia    RADIOLOGY  Chest x-ray: I independently viewed and interpreted the images; is no focal consolidation or edema  XR L foot:  IMPRESSION:  Previous forefoot amputation. Some air/gas apparently in the soft  tissues along the anterior margin. No radiographic sign of  osteomyelitis.    PROCEDURES:  Critical Care performed: No  Procedures   MEDICATIONS ORDERED IN ED: Medications   albuterol  (PROVENTIL ) (2.5 MG/3ML) 0.083% nebulizer solution 2.5 mg (has no administration in time range)  dextromethorphan -guaiFENesin  (MUCINEX  DM) 30-600 MG per 12 hr tablet 1 tablet (has no administration in time range)  ondansetron  (ZOFRAN ) injection 4 mg (has no administration in time range)  hydrALAZINE  (APRESOLINE ) injection 5 mg (has no administration in time range)  acetaminophen  (TYLENOL ) tablet 650 mg (has no administration in time range)  nicotine  (NICODERM CQ  - dosed in mg/24 hours) patch 21 mg (has no administration in time range)  sodium chloride  tablet 1 g (has no administration in time range)  insulin  aspart (novoLOG ) injection 0-9 Units (has no administration in time range)  insulin  aspart (novoLOG ) injection 0-5 Units (has no administration in time range)  cefTRIAXone  (ROCEPHIN ) 2 g in sodium chloride  0.9 % 100 mL IVPB (has no administration in time range)  heparin  injection 5,000 Units (has no administration in time range)  sodium chloride  0.9 % bolus 1,000 mL (1,000 mLs Intravenous New Bag/Given 05/10/23 2214)  vancomycin  (VANCOCIN ) IVPB 1000 mg/200 mL premix (1,000 mg Intravenous New Bag/Given 05/10/23 2216)     IMPRESSION / MDM / ASSESSMENT AND PLAN / ED COURSE  I reviewed the triage vital signs and the nursing notes.  78 year old male with PMH as noted above presents with increased weakness/lethargy after being diagnosed with a left lower extremity cellulitis recently.  On exam the patient's vital signs are normal.  There is no visible erythema, induration, or any open wounds on the left leg.    Differential diagnosis includes, but is not limited to, cellulitis, osteomyelitis, UTI, COVID-19 or other viral syndrome, dehydration, electrolyte abnormality, AKI, other metabolic disturbance.  Initial labs show hyponatremia and an elevated WBC count.  Added on lactate, respiratory panel, urinalysis, as well as chest x-ray and x-ray of the left foot.  Patient's presentation is  most consistent with acute presentation with potential threat to life or bodily function.  The patient is on the cardiac monitor to evaluate for evidence of arrhythmia and/or significant heart rate changes.   ----------------------------------------- 10:05 PM on 05/10/2023 -----------------------------------------  I was able to further examine the left leg.  There is an area of erythema and induration over the medial aspect of the knee, and the skin to the distal thigh appears slightly erythematous as well.  There is increased warmth throughout this area.  There are no other open wounds that I am able to visualize.  The patient  is able to move the knee slightly so I have a low suspicion for septic arthritis, and in either case given the erythema over the knee itself, arthrocentesis would be contraindicated.  ----------------------------------------- 10:35 PM on 05/10/2023 -----------------------------------------  X-ray showed no acute findings.  I have ordered vancomycin  for coverage of cellulitis and saline for the hyponatremia.  I consulted Dr. Rosalea Collin from the hospitalist service; based on our discussion he agrees to evaluate the patient for admission.   FINAL CLINICAL IMPRESSION(S) / ED DIAGNOSES   Final diagnoses:  Left leg cellulitis     Rx / DC Orders   ED Discharge Orders     None        Note:  This document was prepared using Dragon voice recognition software and may include unintentional dictation errors.    Lind Repine, MD 05/10/23 2324

## 2023-05-10 NOTE — Consult Note (Signed)
Pharmacy Antibiotic Note  Miguel Harvey is a 78 y.o. male admitted on 05/10/2023 with  generalized weakness .  Patient looks to have LLE cellulitis, as well.Pharmacy has been consulted for Vancomycin dosing. Patient is also ordered Ceftriaxone 2g IV Q24 hours  Plan: Vancomycin 2g IV total(received 1g in ED, ordered additional 1g to be given on the floor) as loading dose, followed by: 1250 mg IV Q 24 hrs. Goal AUC 400-550. Expected AUC: 462.1 Expected Cmin: 12.3, Vd used: 0.72 SCr used: 1.58   Height: 6' (182.9 cm) Weight: 95.1 kg (209 lb 10.5 oz) IBW/kg (Calculated) : 77.6  Temp (24hrs), Avg:97.9 F (36.6 C), Min:97.8 F (36.6 C), Max:98 F (36.7 C)  Recent Labs  Lab 05/10/23 1502 05/10/23 2026  WBC 13.7*  --   CREATININE 1.58*  --   LATICACIDVEN  --  1.5    Estimated Creatinine Clearance: 46.1 mL/min (A) (by C-G formula based on SCr of 1.58 mg/dL (H)).    Allergies  Allergen Reactions   Spironolactone     Hyperkalemia (currently takes spironolactone)    Antimicrobials this admission: Vancomycin 2/11 >>  Ceftriaxone 2/11 >>   Dose adjustments this admission: N/A  Microbiology results: 2/10 BCx: collected   Thank you for allowing pharmacy to be a part of this patient's care.  Bettey Costa 05/10/2023 11:53 PM

## 2023-05-10 NOTE — H&P (Signed)
History and Physical    Miguel Harvey:[id number] DOB: [birth year] DOA: 05/10/2023  Referring MD/NP/PA:   PCP: [name], MD   Patient coming from:  The patient is coming from SNF   Chief Complaint: left knee pain  HPI: Miguel Harvey is a 78 y.o. male with medical history significant of HTN, HLD, DM, dCHF, COPD on 2L O2, CAD, depression, CKD-3A, PEA, NSVT, chronic left foot osteomyelitis, s/p of right BKA, tobacco abuse, who presents with left knee pain.  Patient states that he has left knee pain for about 3 days, which is constant, sharp, severe, nonradiating, aggravated by movement.  The left knee is minimally swelling with warmth.  No fever or chills.  Denies chest pain, cough, SOB.  No nausea, vomiting, diarrhea or abdominal pain.  No symptoms of UTI.  He has chronic left foot ulcer without drainage.   Data reviewed independently and ED Course: pt was found to have WBC 13.7, lactic acid 1.5, worsening renal function, negative PCR for COVID, flu and RSV, temperature normal, blood pressure 139/77, heart rate 89, RR 19, oxygen saturation 100% on room air.  X-ray of left foot is negative for signs of osteomyelitis.  Patient is placed on telemetry bed for obs.  EKG: I have personally reviewed.  Sinus rhythm, QTc 450, PVC, low voltage.   Review of Systems:   General: no fevers, chills, no body weight gain, has fatigue HEENT: no blurry vision, hearing changes or sore throat Respiratory: no dyspnea, coughing, wheezing CV: no chest pain, no palpitations GI: no nausea, vomiting, abdominal pain, diarrhea, constipation GU: no dysuria, burning on urination, increased urinary frequency, hematuria  Ext: no leg edema. S/p of R BKA Neuro: no unilateral weakness, numbness, or tingling, no vision change or hearing loss Skin: has chronic left foot ulcer MSK: No muscle spasm, no deformity. Has left knee pain Heme: No easy bruising.  Travel history: No recent long distant travel.   Allergy:   Allergies  Allergen Reactions   Spironolactone     Hyperkalemia (currently takes spironolactone)    Past Medical History:  Diagnosis Date   Abnormal posture    CHF (congestive heart failure) (HCC)    Chronic kidney disease    CKD   stage 2   Complication of anesthesia    heart stopped x2 during toe amputation   COPD (chronic obstructive pulmonary disease) (HCC)    Coronary artery disease, non-occlusive    a. LHC 05/2011: 10-20% ostial LAD stenosis   Depression    Diabetes mellitus    Grade I diastolic dysfunction    HLD (hyperlipidemia)    Hypertension    Muscle weakness    Neuromuscular disorder (HCC)    neuropathy   NICM (nonischemic cardiomyopathy) (HCC)    a. TTE 2/213: EF 50-55%, mild LVH, normal wall motion, Gr2DD, mildly dilated LA; b. TTE 2/13: EF 40-45%, mild concentric LVH, global HK, no evidence of LV thrombus; c. TTE 4/19: EF 40%, diffuse HK, Gr2DD, mild MR, mildly dilated LA, RV cavity size normal w/ normal RVSF, PASP 63; d. TTE 4/19: EF 25-30%, diffuse HK, mild LVH, mild MR, mild PASP; e. TTE 5/19: EF 20-25%, diffuse HK, moderate MR,   NSVT (nonsustained ventricular tachycardia) (HCC)    PAD (peripheral artery disease) (HCC)    a. s/p right BKA s/p  recent balloon angioplasty fo the left peroneal artery, left posterior tibial artery, tibioperoneal trunk, and stenting to the distal left SFA in 07/2017   PEA (Pulseless electrical  activity) (HCC)    heart stopped beating during surgery for amputation of toes    Past Surgical History:  Procedure Laterality Date   AMPUTATION Right 07/02/2017   Procedure: RIGHT BELOW KNEE AMPUTATION;  Surgeon: Nadara Mustard, MD;  Location: [facility] OR;  Service: Orthopedics;  Laterality: Right;   AMPUTATION TOE Left 05/11/2021   Procedure: LEFT GREAT  TOE AMPUTATION;  Surgeon: Gwyneth Revels, DPM;  Location: [facility] ORS;  Service: Podiatry;  Laterality: Left;   CARDIAC CATHETERIZATION     CATARACT EXTRACTION W/PHACO Left 11/08/2018   Procedure:  CATARACT EXTRACTION PHACO AND INTRAOCULAR LENS PLACEMENT (IOC)  LEFT VISION BLUE;  Surgeon: Elliot Cousin, MD;  Location: [facility] ORS;  Service: Ophthalmology;  Laterality: Left;  [place] 03:38.9 CDE 42.37 Fluid pack lot # [id number]   CATARACT EXTRACTION W/PHACO Right 03/25/2023   Procedure: CATARACT EXTRACTION PHACO AND INTRAOCULAR LENS PLACEMENT (IOC) RIGHT DIABETIC MALYUGIN OMIDRIA 24.82 02:08.6;  Surgeon: Estanislado Pandy, MD;  Location: [facility];  Service: Ophthalmology;  Laterality: Right;   cyst removal from  right leg about 6 years ago     IRRIGATION AND DEBRIDEMENT FOOT Left 08/10/2017   Procedure: IRRIGATION AND DEBRIDEMENT FOOT;  Surgeon: Gwyneth Revels, DPM;  Location: [facility] ORS;  Service: Podiatry;  Laterality: Left;   LEFT HEART CATHETERIZATION WITH CORONARY ANGIOGRAM N/A 05/11/2011   Procedure: LEFT HEART CATHETERIZATION WITH CORONARY ANGIOGRAM;  Surgeon: Lennette Bihari, MD;  Location: [facility] CATH LAB;  Service: Cardiovascular;  Laterality: N/A;   LOWER EXTREMITY ANGIOGRAPHY Left 08/13/2017   Procedure: Lower Extremity Angiography and PICC line placement;  Surgeon: Annice Needy, MD;  Location: [facility] INVASIVE CV LAB;  Service: Cardiovascular;  Laterality: Left;   ruptured navel     repair of   TOE AMPUTATION     not sure of when this surgery occured, but his heart stopped beating x 2 during surgery    Social History:  reports that he has been smoking cigarettes. He quit smokeless tobacco use about 12 years ago. He reports that he does not currently use alcohol after a past usage of about 6.0 standard drinks of alcohol per week. He reports that he does not use drugs.  Family History:  Family History  Problem Relation Age of Onset   CAD Other    Diabetes Other      Prior to Admission medications   Medication Sig Start Date End Date Taking? Authorizing Provider  acetaminophen (TYLENOL) 325 MG tablet Take 2 tablets (650 mg total) by mouth every 6 (six) hours as needed for mild  pain (or Fever >/= 101). Patient not taking: Reported on 12/28/2022 05/14/21   Leeroy Bock, MD  aspirin EC 81 MG EC tablet Take 1 tablet (81 mg total) by mouth daily. Patient not taking: Reported on 12/28/2022 08/14/17   Enedina Finner, MD  atorvastatin (LIPITOR) 20 MG tablet Take 20 mg by mouth every evening.    [provider]  carboxymethylcellulose (REFRESH PLUS) 0.5 % SOLN Place 1 drop into both eyes 4 (four) times daily.    [provider]  carvedilol (COREG) 6.25 MG tablet Take 6.25 mg by mouth 2 (two) times daily with a meal. Patient not taking: Reported on 12/28/2022    [provider]  clopidogrel (PLAVIX) 75 MG tablet Take 1 tablet (75 mg total) by mouth daily. 08/14/17   Enedina Finner, MD  Continuous Glucose Receiver (DEXCOM G7 RECEIVER) DEVI 1 Device by Does not apply route continuous. 02/18/23   Motwani,  Komal, MD  Continuous Glucose Sensor (DEXCOM G7 SENSOR) MISC 1 Device by Does not apply route continuous. 02/18/23   Altamese Rockmart, MD  Cyanocobalamin (B-12 PO) Take by mouth daily at 2 am.    [provider]  Dulaglutide (TRULICITY) 1.5 MG/0.5ML SOPN Inject 1.5 mg into the skin every Friday. Patient not taking: Reported on 12/28/2022    [provider]  furosemide (LASIX) 20 MG tablet Take 20 mg by mouth daily.    [provider]  guaiFENesin (ROBITUSSIN) 100 MG/5ML liquid Take 5 mLs by mouth every 4 (four) hours as needed for cough or to loosen phlegm. Patient not taking: Reported on 12/28/2022    [provider]  insulin glargine, 2 Unit Dial, (TOUJEO MAX SOLOSTAR) 300 UNIT/ML Solostar Pen Inject 10 Units into the skin daily at 6 (six) AM.    [provider]  insulin lispro (HUMALOG) 100 UNIT/ML KwikPen Inject 10-13 Units into the skin 3 (three) times daily. HOLD IF CBG < or = 150 04/05/19   [provider]  Insulin Lispro-aabc (LYUMJEV) 100 UNIT/ML SOLN Inject 5 Units as directed daily at 6 (six) AM. BASED  ON SLIDING SCALE    [provider]  ipratropium-albuterol (DUONEB) 0.5-2.5 (3) MG/3ML SOLN Take by nebulization as needed. Patient not taking: Reported on 12/28/2022 08/26/21   [provider]  latanoprost (XALATAN) 0.005 % ophthalmic solution Place 1 drop into both eyes at bedtime.    [provider]  linagliptin (TRADJENTA) 5 MG TABS tablet Take 5 mg by mouth daily.    [provider]  lisinopril (ZESTRIL) 5 MG tablet Take 5 mg by mouth daily.    [provider]  Magnesium 400 MG TABS Take 400 mg by mouth 2 (two) times daily.     [provider]  Multiple Vitamin (MULTIVITAMIN WITH MINERALS) TABS tablet Take 1 tablet by mouth daily.    [provider]  sertraline (ZOLOFT) 50 MG tablet Take 50 mg by mouth daily.    [provider]  traZODone (DESYREL) 50 MG tablet Take 0.5 mg by mouth at bedtime. Patient not taking: Reported on 02/18/2023    [provider]    Physical Exam: Vitals:   05/10/23 1514 05/10/23 1854 05/10/23 2253 05/10/23 2324  BP: 116/78 139/77 121/68 133/76  Pulse: 87 89 77 74  Resp: 19 18 17 18   Temp: 97.8 F (36.6 C)   98 F (36.7 C)  TempSrc: Oral     SpO2: 100% 100% 98% 100%  Weight:    95.1 kg  Height:    6' (1.829 m)   General: Not in acute distress HEENT:       Eyes: PERRL, EOMI, no jaundice       ENT: No discharge from the ears and nose, no pharynx injection, no tonsillar enlargement.        Neck: No JVD, no bruit, no mass felt. Heme: No neck lymph node enlargement. Cardiac: S1/S2, RRR, No murmurs, No gallops or rubs. Respiratory: No rales, wheezing, rhonchi or rubs. GI: Soft, nondistended, nontender, no rebound pain, no organomegaly, BS present. GU: No hematuria Ext: No pitting leg edema bilaterally. 1+DP/PT pulse bilaterally. S/p of right BKA. Has left ulcer without drainage, no tenderness or surrounding erythema, scabbed.  Patient has tenderness and warmth over left knee  joint.  The left knee is minimally swelling.    Musculoskeletal: No joint deformities. Skin: No rashes.  Neuro: Alert, oriented X3, cranial nerves II-XII grossly intact, moves  all extremities Psych: Patient is not psychotic, no suicidal or hemocidal ideation.  Labs on Admission: I have personally reviewed following labs and imaging studies  CBC: Recent Labs  Lab 05/10/23 1502  WBC 13.7*  HGB 9.6*  HCT 28.9*  MCV 83.3  PLT 300   Basic Metabolic Panel: Recent Labs  Lab 05/10/23 1502 05/10/23 2349  NA 124*  --   K 4.1  --   CL 92*  --   CO2 21*  --   GLUCOSE 245*  --   BUN 44*  --   CREATININE 1.58*  --   CALCIUM 7.6*  --   MG  --  2.9*   GFR: Estimated Creatinine Clearance: 46.1 mL/min (A) (by C-G formula based on SCr of 1.58 mg/dL (H)). Liver Function Tests: No results for input(s): "AST", "ALT", "ALKPHOS", "BILITOT", "PROT", "ALBUMIN" in the last 168 hours. No results for input(s): "LIPASE", "AMYLASE" in the last 168 hours. No results for input(s): "AMMONIA" in the last 168 hours. Coagulation Profile: No results for input(s): "INR", "PROTIME" in the last 168 hours. Cardiac Enzymes: No results for input(s): "CKTOTAL", "CKMB", "CKMBINDEX", "TROPONINI" in the last 168 hours. BNP (last 3 results) No results for input(s): "PROBNP" in the last 8760 hours. HbA1C: No results for input(s): "HGBA1C" in the last 72 hours. CBG: Recent Labs  Lab 05/10/23 1517 05/10/23 2335  GLUCAP 221* 331*   Lipid Profile: No results for input(s): "CHOL", "HDL", "LDLCALC", "TRIG", "CHOLHDL", "LDLDIRECT" in the last 72 hours. Thyroid Function Tests: No results for input(s): "TSH", "T4TOTAL", "FREET4", "T3FREE", "THYROIDAB" in the last 72 hours. Anemia Panel: No results for input(s): "VITAMINB12", "FOLATE", "FERRITIN", "TIBC", "IRON", "RETICCTPCT" in the last 72 hours. Urine analysis:    Component Value Date/Time   COLORURINE YELLOW (A) 05/08/2021 1558   APPEARANCEUR HAZY (A)  05/08/2021 1558   LABSPEC 1.017 05/08/2021 1558   PHURINE 5.0 05/08/2021 1558   GLUCOSEU 50 (A) 05/08/2021 1558   HGBUR NEGATIVE 05/08/2021 1558   BILIRUBINUR NEGATIVE 05/08/2021 1558   KETONESUR 5 (A) 05/08/2021 1558   PROTEINUR 30 (A) 05/08/2021 1558   UROBILINOGEN 0.2 05/07/2011 1813   NITRITE NEGATIVE 05/08/2021 1558   LEUKOCYTESUR NEGATIVE 05/08/2021 1558   Sepsis Labs: @LABRCNTIP (procalcitonin:4,lacticidven:4) ) Recent Results (from the past 240 hours)  Resp panel by RT-PCR (RSV, Flu A&B, Covid) Anterior Nasal Swab     Status: None   Collection Time: 05/10/23  8:26 PM   Specimen: Anterior Nasal Swab  Result Value Ref Range Status   SARS Coronavirus 2 by RT PCR NEGATIVE NEGATIVE Final    Comment: (NOTE) SARS-CoV-2 target nucleic acids are NOT DETECTED.  The SARS-CoV-2 RNA is generally detectable in upper respiratory specimens during the acute phase of infection. The lowest concentration of SARS-CoV-2 viral copies this assay can detect is 138 copies/mL. A negative result does not preclude SARS-Cov-2 infection and should not be used as the sole basis for treatment or other patient management decisions. A negative result may occur with  improper specimen collection/handling, submission of specimen other than nasopharyngeal swab, presence of viral mutation(s) within the areas targeted by this assay, and inadequate number of viral copies(<138 copies/mL). A negative result must be combined with clinical observations, patient history, and epidemiological information. The expected result is Negative.  Fact Sheet for Patients:  BloggerCourse.com  Fact Sheet for Healthcare Providers:  SeriousBroker.it  This test is no t yet approved or cleared by the [place] FDA and  has been authorized for detection and/or diagnosis  of SARS-CoV-2 by FDA under an Emergency Use Authorization (EUA). This EUA will remain  in effect (meaning  this test can be used) for the duration of the COVID-19 declaration under Section 564(b)(1) of the Act, 21 U.S.C.section 360bbb-3(b)(1), unless the authorization is terminated  or revoked sooner.       Influenza A by PCR NEGATIVE NEGATIVE Final   Influenza B by PCR NEGATIVE NEGATIVE Final    Comment: (NOTE) The Xpert Xpress SARS-CoV-2/FLU/RSV plus assay is intended as an aid in the diagnosis of influenza from Nasopharyngeal swab specimens and should not be used as a sole basis for treatment. Nasal washings and aspirates are unacceptable for Xpert Xpress SARS-CoV-2/FLU/RSV testing.  Fact Sheet for Patients: BloggerCourse.com  Fact Sheet for Healthcare Providers: SeriousBroker.it  This test is not yet approved or cleared by the [place] FDA and has been authorized for detection and/or diagnosis of SARS-CoV-2 by FDA under an Emergency Use Authorization (EUA). This EUA will remain in effect (meaning this test can be used) for the duration of the COVID-19 declaration under Section 564(b)(1) of the Act, 21 U.S.C. section 360bbb-3(b)(1), unless the authorization is terminated or revoked.     Resp Syncytial Virus by PCR NEGATIVE NEGATIVE Final    Comment: (NOTE) Fact Sheet for Patients: BloggerCourse.com  Fact Sheet for Healthcare Providers: SeriousBroker.it  This test is not yet approved or cleared by the [place] FDA and has been authorized for detection and/or diagnosis of SARS-CoV-2 by FDA under an Emergency Use Authorization (EUA). This EUA will remain in effect (meaning this test can be used) for the duration of the COVID-19 declaration under Section 564(b)(1) of the Act, 21 U.S.C. section 360bbb-3(b)(1), unless the authorization is terminated or revoked.  Performed at [facility], [address], [place], [place] [address]      Radiological Exams on  Admission:   Assessment/Plan Principal Problem:   Left knee pain Active Problems:   Foot osteomyelitis, left (HCC)   Hyponatremia   Type II diabetes mellitus with renal manifestations (HCC)   COPD (chronic obstructive pulmonary disease) (HCC)   HLD (hyperlipidemia)   HTN (hypertension)   CAD (coronary artery disease)   Chronic diastolic CHF (congestive heart failure) (HCC)   Chronic kidney disease, stage 3a (HCC)   TOBACCO DEPENDENCE   Depression   Assessment and Plan:  Left knee pain: Etiology is not clear.  Patient denies any fall or injury.  On examination, pt has tenderness and warmth over left knee, with minimal swelling.  Potential differential diagnosis includes fracture and septic joint.  Patient has leukocytosis with WBC 13.7, will start antibiotics.  Patient does not have fever, lactic acid normal 1.5, clinically no sepsis.   -Placed on telemetry bed for observation -Antibiotics: Vancomycin and Rocephin -Follow-up blood culture -As needed Percocet, Tylenol for pain -Follow-up x-ray of left knee.  If patient does not have fracture, then highly suspect septic knee joint, then will need to consult Ortho. -IV fluid: 1 L normal saline  Foot osteomyelitis, left (HCC): this is a chronic issue, the wound looks like scabbed, no active drainage. -Patient is on broad antibiotics as above -Wound care consult  Hyponatremia: Na 124.  Mental status normal.  Likely due to dehydration and continuation of Lasix, lisinopril - Will check urine sodium, urine osmolality, serum osmolality. - Fluid restriction - IVF: 1L NS  - Sodium chloride tablet 1 g twice daily -Hold Lasix and lisinopril  Type II diabetes mellitus with renal manifestations [facility]): Recent A1c 9.8,  patient is taking NovoLog, glargine insulin 10 units daily -Sliding scale insulin -Glargine insulin 70 units daily  COPD (chronic obstructive pulmonary disease) (HCC): Stable -Bronchodilators and as needed Mucinex  HLD  (hyperlipidemia) -Lipitor  HTN (hypertension) -IV hydralazine as needed -Coreg -Hold Lasix and lisinopril  CAD (coronary artery disease): No chest pain -Hold Plavix in case patient needs surgery -Aspirin, Lipitor  Chronic diastolic CHF (congestive heart failure) (HCC): 2D echo on 05/25/2019 showed EF of 50-55% with grade 1 diastolic dysfunction.  No leg edema JVD.  CHF is compensated. -Hold Lasix  Chronic kidney disease, stage 3a (HCC): Renal function is worse than baseline.  Recent baseline creatinine 1.32 on 12/21/2022.  His creatinine is 1.58, BUN 44, GFR 44. -Hold Lasix and spironolactone -IV fluids above  TOBACCO DEPENDENCE -Nicotine patch   Depression -Continue home medications       DVT ppx: SQ Heparin     Code Status: Full code     Family Communication:     not done, no family member is at bed side.    Disposition Plan:  Anticipate discharge back to previous environment, SNF  Consults called:  none  Admission status and Level of care: Telemetry Medical:    for obs    Dispo: The patient is from: SNF              Anticipated d/c is to: SNF              Anticipated d/c date is: 1 day              Patient currently is not medically stable to d/c.    Severity of Illness:  The appropriate patient status for this patient is OBSERVATION. Observation status is judged to be reasonable and necessary in order to provide the required intensity of service to ensure the patient's safety. The patient's presenting symptoms, physical exam findings, and initial radiographic and laboratory data in the context of their medical condition is felt to place them at decreased risk for further clinical deterioration. Furthermore, it is anticipated that the patient will be medically stable for discharge from the hospital within 2 midnights of admission.        Date of Service 05/11/2023    Lorretta Harp [facility]   If 7PM-7AM, please contact  night-coverage [email or web address] 05/11/2023, 1:07 AM

## 2023-05-10 NOTE — ED Triage Notes (Addendum)
 Pt to ED via [facility] from [facility]. Pt has cellulitis on LLE and was placed on an with low grade temp of 99.7 and was given tylenol . Facility reports decline since Saturday. Pt got 2L NS at facility. Pt A&Ox4. Pt RKA  97.8 oral 96% RA  ET 34 RR 18 CBG 263 118/53

## 2023-05-10 NOTE — ED Notes (Signed)
 Pt has been transported upstairs at this time.

## 2023-05-11 ENCOUNTER — Observation Stay: Payer: 59 | Admitting: General Practice

## 2023-05-11 ENCOUNTER — Encounter
Admission: EM | Disposition: A | Discharge status: Skilled Nursing Facility | Payer: Self-pay | Source: Skilled Nursing Facility | Attending: Internal Medicine

## 2023-05-11 ENCOUNTER — Observation Stay: Payer: 59

## 2023-05-11 ENCOUNTER — Other Ambulatory Visit: Payer: Self-pay

## 2023-05-11 DIAGNOSIS — M25462 Effusion, left knee: Secondary | ICD-10-CM

## 2023-05-11 DIAGNOSIS — M009 Pyogenic arthritis, unspecified: Secondary | ICD-10-CM

## 2023-05-11 HISTORY — PX: KNEE ARTHROSCOPY: SHX127

## 2023-05-11 LAB — CBC
HCT: 29.4 % — ABNORMAL LOW (ref 39.0–52.0)
Hemoglobin: 10.2 g/dL — ABNORMAL LOW (ref 13.0–17.0)
MCH: 28.3 pg (ref 26.0–34.0)
MCHC: 34.7 g/dL (ref 30.0–36.0)
MCV: 81.4 fL (ref 80.0–100.0)
Platelets: 315 10*3/uL (ref 150–400)
RBC: 3.61 MIL/uL — ABNORMAL LOW (ref 4.22–5.81)
RDW: 13 % (ref 11.5–15.5)
WBC: 17.8 10*3/uL — ABNORMAL HIGH (ref 4.0–10.5)
nRBC: 0 % (ref 0.0–0.2)

## 2023-05-11 LAB — MAGNESIUM: Magnesium: 2.9 mg/dL — ABNORMAL HIGH (ref 1.7–2.4)

## 2023-05-11 LAB — BASIC METABOLIC PANEL
Anion gap: 12 (ref 5–15)
BUN: 42 mg/dL — ABNORMAL HIGH (ref 8–23)
CO2: 21 mmol/L — ABNORMAL LOW (ref 22–32)
Calcium: 7.6 mg/dL — ABNORMAL LOW (ref 8.9–10.3)
Chloride: 89 mmol/L — ABNORMAL LOW (ref 98–111)
Creatinine, Ser: 1.38 mg/dL — ABNORMAL HIGH (ref 0.61–1.24)
GFR, Estimated: 52 mL/min — ABNORMAL LOW (ref >60)
Glucose, Bld: 335 mg/dL — ABNORMAL HIGH (ref 70–99)
Potassium: 4.3 mmol/L (ref 3.5–5.1)
Sodium: 122 mmol/L — ABNORMAL LOW (ref 135–145)

## 2023-05-11 LAB — SYNOVIAL CELL COUNT + DIFF, W/ CRYSTALS
Crystals, Fluid: NONE SEEN
Eosinophils-Synovial: 0 %
Lymphocytes-Synovial Fld: 1 %
Monocyte-Macrophage-Synovial Fluid: 0 %
Neutrophil, Synovial: 99 %
WBC, Synovial: 193603 /mm3 — ABNORMAL HIGH (ref 0–200)

## 2023-05-11 LAB — GLUCOSE, CAPILLARY
Glucose-Capillary: 333 mg/dL — ABNORMAL HIGH (ref 70–99)
Glucose-Capillary: 332 mg/dL — ABNORMAL HIGH (ref 70–99)
Glucose-Capillary: 303 mg/dL — ABNORMAL HIGH (ref 70–99)
Glucose-Capillary: 258 mg/dL — ABNORMAL HIGH (ref 70–99)
Glucose-Capillary: 227 mg/dL — ABNORMAL HIGH (ref 70–99)
Glucose-Capillary: 177 mg/dL — ABNORMAL HIGH (ref 70–99)

## 2023-05-11 LAB — OSMOLALITY: Osmolality: 288 mosm/kg (ref 275–295)

## 2023-05-11 LAB — SEDIMENTATION RATE: Sed Rate: 127 mm/h — ABNORMAL HIGH (ref 0–20)

## 2023-05-11 LAB — C-REACTIVE PROTEIN: CRP: 43.6 mg/dL — ABNORMAL HIGH (ref <1.0)

## 2023-05-11 LAB — BRAIN NATRIURETIC PEPTIDE: B Natriuretic Peptide: 406.5 pg/mL — ABNORMAL HIGH (ref 0.0–100.0)

## 2023-05-11 SURGERY — ARTHROSCOPY, KNEE
Anesthesia: General | Site: Knee | Laterality: Left

## 2023-05-11 MED ORDER — ACETAMINOPHEN 500 MG PO TABS
500.0000 mg | ORAL_TABLET | Freq: Four times a day (QID) | ORAL | Status: AC
  Administered 2023-05-11 – 2023-05-12 (×2): 500 mg via ORAL
  Filled 2023-05-11 (×3): qty 1

## 2023-05-11 MED ORDER — ATORVASTATIN CALCIUM 20 MG PO TABS
20.0000 mg | ORAL_TABLET | Freq: Every evening | ORAL | Status: DC
  Administered 2023-05-11 – 2023-05-16 (×6): 20 mg via ORAL
  Filled 2023-05-11 (×6): qty 1

## 2023-05-11 MED ORDER — HYDROCODONE-ACETAMINOPHEN 7.5-325 MG PO TABS
1.0000 | ORAL_TABLET | ORAL | Status: DC | PRN
  Administered 2023-05-12 – 2023-05-16 (×7): 1 via ORAL
  Filled 2023-05-11 (×7): qty 1

## 2023-05-11 MED ORDER — ADULT MULTIVITAMIN W/MINERALS CH
1.0000 | ORAL_TABLET | Freq: Every day | ORAL | Status: DC
  Administered 2023-05-11 – 2023-05-17 (×7): 1 via ORAL
  Filled 2023-05-11 (×7): qty 1

## 2023-05-11 MED ORDER — VANCOMYCIN HCL 1500 MG/300ML IV SOLN
1500.0000 mg | INTRAVENOUS | Status: DC
  Administered 2023-05-12 – 2023-05-17 (×6): 1500 mg via INTRAVENOUS
  Filled 2023-05-11 (×6): qty 300

## 2023-05-11 MED ORDER — ONDANSETRON HCL 4 MG/2ML IJ SOLN
INTRAMUSCULAR | Status: DC | PRN
  Administered 2023-05-11: 4 mg via INTRAVENOUS

## 2023-05-11 MED ORDER — PROPOFOL 10 MG/ML IV BOLUS
INTRAVENOUS | Status: AC
  Filled 2023-05-11: qty 20

## 2023-05-11 MED ORDER — TRAZODONE HCL 50 MG PO TABS
50.0000 mg | ORAL_TABLET | Freq: Every evening | ORAL | Status: DC | PRN
  Administered 2023-05-13 (×2): 50 mg via ORAL
  Filled 2023-05-11 (×2): qty 1

## 2023-05-11 MED ORDER — FENTANYL CITRATE (PF) 100 MCG/2ML IJ SOLN: 25.0000 ug | INTRAMUSCULAR | Status: DC | PRN

## 2023-05-11 MED ORDER — METOCLOPRAMIDE HCL 5 MG/ML IJ SOLN: 5.0000 mg | Freq: Three times a day (TID) | INTRAMUSCULAR | Status: DC | PRN

## 2023-05-11 MED ORDER — METOCLOPRAMIDE HCL 5 MG PO TABS: 5.0000 mg | ORAL_TABLET | Freq: Three times a day (TID) | ORAL | Status: DC | PRN

## 2023-05-11 MED ORDER — CARVEDILOL 6.25 MG PO TABS
6.2500 mg | ORAL_TABLET | Freq: Two times a day (BID) | ORAL | Status: DC
  Administered 2023-05-11 – 2023-05-17 (×13): 6.25 mg via ORAL
  Filled 2023-05-11 (×13): qty 1

## 2023-05-11 MED ORDER — OXYCODONE-ACETAMINOPHEN 5-325 MG PO TABS
1.0000 | ORAL_TABLET | ORAL | Status: DC | PRN
  Administered 2023-05-11 – 2023-05-17 (×13): 1 via ORAL
  Filled 2023-05-11 (×13): qty 1

## 2023-05-11 MED ORDER — MENTHOL 3 MG MT LOZG: 1.0000 | LOZENGE | OROMUCOSAL | Status: DC | PRN

## 2023-05-11 MED ORDER — LATANOPROST 0.005 % OP SOLN
1.0000 [drp] | Freq: Every day | OPHTHALMIC | Status: DC
  Administered 2023-05-11 – 2023-05-13 (×3): 1 [drp] via OPHTHALMIC
  Filled 2023-05-11: qty 2.5

## 2023-05-11 MED ORDER — GENTAMICIN SULFATE 40 MG/ML IJ SOLN
INTRAMUSCULAR | Status: AC
  Filled 2023-05-11: qty 4

## 2023-05-11 MED ORDER — FENTANYL CITRATE (PF) 100 MCG/2ML IJ SOLN
INTRAMUSCULAR | Status: AC
  Filled 2023-05-11: qty 2

## 2023-05-11 MED ORDER — VITAMIN B-12 100 MCG PO TABS
100.0000 ug | ORAL_TABLET | Freq: Every day | ORAL | Status: DC
  Administered 2023-05-11 – 2023-05-17 (×7): 100 ug via ORAL
  Filled 2023-05-11 (×7): qty 1

## 2023-05-11 MED ORDER — INSULIN ASPART 100 UNIT/ML IJ SOLN
5.0000 [IU] | Freq: Three times a day (TID) | INTRAMUSCULAR | Status: DC
  Administered 2023-05-11 – 2023-05-17 (×17): 5 [IU] via SUBCUTANEOUS
  Filled 2023-05-11 (×17): qty 1

## 2023-05-11 MED ORDER — LIDOCAINE HCL (CARDIAC) PF 100 MG/5ML IV SOSY
PREFILLED_SYRINGE | INTRAVENOUS | Status: DC | PRN
  Administered 2023-05-11: 100 mg via INTRAVENOUS

## 2023-05-11 MED ORDER — CEFAZOLIN SODIUM-DEXTROSE 2-3 GM-%(50ML) IV SOLR
INTRAVENOUS | Status: DC | PRN
Start: 2023-05-11 — End: 2023-05-11
  Administered 2023-05-11: 2 g via INTRAVENOUS

## 2023-05-11 MED ORDER — INSULIN ASPART 100 UNIT/ML IJ SOLN
INTRAMUSCULAR | Status: AC
  Filled 2023-05-11: qty 1

## 2023-05-11 MED ORDER — MORPHINE SULFATE (PF) 2 MG/ML IV SOLN
0.5000 mg | INTRAVENOUS | Status: DC | PRN
  Administered 2023-05-12 – 2023-05-13 (×3): 1 mg via INTRAVENOUS
  Administered 2023-05-16: 0.5 mg via INTRAVENOUS
  Filled 2023-05-11 (×4): qty 1

## 2023-05-11 MED ORDER — INSULIN ASPART 100 UNIT/ML IJ SOLN
10.0000 [IU] | Freq: Once | INTRAMUSCULAR | Status: AC
  Administered 2023-05-11: 10 [IU] via SUBCUTANEOUS

## 2023-05-11 MED ORDER — PHENYLEPHRINE 80 MCG/ML (10ML) SYRINGE FOR IV PUSH (FOR BLOOD PRESSURE SUPPORT)
PREFILLED_SYRINGE | INTRAVENOUS | Status: DC | PRN
  Administered 2023-05-11 (×3): 80 ug via INTRAVENOUS

## 2023-05-11 MED ORDER — DOCUSATE SODIUM 100 MG PO CAPS
100.0000 mg | ORAL_CAPSULE | Freq: Two times a day (BID) | ORAL | Status: DC
  Administered 2023-05-11 – 2023-05-17 (×10): 100 mg via ORAL
  Filled 2023-05-11 (×12): qty 1

## 2023-05-11 MED ORDER — VANCOMYCIN HCL 1250 MG/250ML IV SOLN: 1250.0000 mg | INTRAVENOUS | Status: DC

## 2023-05-11 MED ORDER — ASPIRIN 81 MG PO TBEC
81.0000 mg | DELAYED_RELEASE_TABLET | Freq: Every day | ORAL | Status: DC
  Administered 2023-05-11 – 2023-05-17 (×7): 81 mg via ORAL
  Filled 2023-05-11 (×7): qty 1

## 2023-05-11 MED ORDER — MUPIROCIN CALCIUM 2 % EX CREA
TOPICAL_CREAM | Freq: Every day | CUTANEOUS | Status: DC
  Filled 2023-05-11: qty 15

## 2023-05-11 MED ORDER — INSULIN GLARGINE-YFGN 100 UNIT/ML ~~LOC~~ SOLN
10.0000 [IU] | Freq: Every day | SUBCUTANEOUS | Status: DC
  Administered 2023-05-12 – 2023-05-17 (×6): 10 [IU] via SUBCUTANEOUS
  Filled 2023-05-11 (×6): qty 0.1

## 2023-05-11 MED ORDER — PROPOFOL 10 MG/ML IV BOLUS
INTRAVENOUS | Status: DC | PRN
  Administered 2023-05-11: 90 mg via INTRAVENOUS

## 2023-05-11 MED ORDER — SODIUM CHLORIDE 0.9 % IV SOLN: INTRAVENOUS | Status: DC | PRN

## 2023-05-11 MED ORDER — SERTRALINE HCL 50 MG PO TABS
50.0000 mg | ORAL_TABLET | Freq: Every day | ORAL | Status: DC
  Administered 2023-05-11 – 2023-05-17 (×7): 50 mg via ORAL
  Filled 2023-05-11 (×7): qty 1

## 2023-05-11 MED ORDER — PHENOL 1.4 % MT LIQD: 1.0000 | OROMUCOSAL | Status: DC | PRN

## 2023-05-11 MED ORDER — FENTANYL CITRATE (PF) 100 MCG/2ML IJ SOLN
INTRAMUSCULAR | Status: DC | PRN
  Administered 2023-05-11 (×2): 50 ug via INTRAVENOUS

## 2023-05-11 MED ORDER — OXYCODONE HCL 5 MG/5ML PO SOLN: 5.0000 mg | Freq: Once | ORAL | Status: DC | PRN

## 2023-05-11 MED ORDER — OXYCODONE HCL 5 MG PO TABS: 5.0000 mg | ORAL_TABLET | Freq: Once | ORAL | Status: DC | PRN

## 2023-05-11 MED ORDER — FUROSEMIDE 20 MG PO TABS
20.0000 mg | ORAL_TABLET | Freq: Every day | ORAL | Status: DC
  Administered 2023-05-11 – 2023-05-17 (×7): 20 mg via ORAL
  Filled 2023-05-11 (×7): qty 1

## 2023-05-11 MED ORDER — POLYVINYL ALCOHOL 1.4 % OP SOLN
1.0000 [drp] | Freq: Four times a day (QID) | OPHTHALMIC | Status: DC
  Administered 2023-05-11 – 2023-05-14 (×10): 1 [drp] via OPHTHALMIC
  Filled 2023-05-11: qty 15

## 2023-05-11 MED ORDER — LACTATED RINGERS IR SOLN: Status: DC | PRN

## 2023-05-11 MED ORDER — INSULIN GLARGINE-YFGN 100 UNIT/ML ~~LOC~~ SOLN
7.0000 [IU] | Freq: Every day | SUBCUTANEOUS | Status: DC
  Administered 2023-05-11: 7 [IU] via SUBCUTANEOUS
  Filled 2023-05-11: qty 0.07

## 2023-05-11 SURGICAL SUPPLY — 32 items
BLADE FULL RADIUS 3.5 (BLADE) IMPLANT
BLADE SHAVER 4.5X7 STR FR (MISCELLANEOUS) ×2 IMPLANT
BNDG ELASTIC 6INX 5YD STR LF (GAUZE/BANDAGES/DRESSINGS) ×2 IMPLANT
BNDG ELASTIC 6X5.8 VLCR NS LF (GAUZE/BANDAGES/DRESSINGS) ×2 IMPLANT
BNDG ESMARCH 6X12 STRL LF (GAUZE/BANDAGES/DRESSINGS) ×2 IMPLANT
CHLORAPREP W/TINT 26 (MISCELLANEOUS) ×2 IMPLANT
CONNECTOR 5 IN 1 STRAIGHT STRL (MISCELLANEOUS) IMPLANT
CUFF TRNQT CYL 24X4X16.5-23 (TOURNIQUET CUFF) IMPLANT
CUFF TRNQT CYL 30X4X21-28X (TOURNIQUET CUFF) IMPLANT
DERMABOND ADVANCED .7 DNX12 (GAUZE/BANDAGES/DRESSINGS) ×2 IMPLANT
DRAPE ARTHROSCOPY W/POUCH 90 (DRAPES) ×2 IMPLANT
DRAPE IMP U-DRAPE 54X76 (DRAPES) ×2 IMPLANT
EVACUATOR 1/8 PVC DRAIN (DRAIN) IMPLANT
GAUZE SPONGE 4X4 12PLY STRL (GAUZE/BANDAGES/DRESSINGS) ×2 IMPLANT
GLOVE PI ORTHO PRO STRL 7.5 (GLOVE) ×4 IMPLANT
GLOVE SURG SYN 7.5 E (GLOVE) ×1 IMPLANT
GLOVE SURG SYN 7.5 PF PI (GLOVE) ×2 IMPLANT
GOWN SRG XL LVL 3 NONREINFORCE (GOWNS) ×2 IMPLANT
GOWN STRL REUS W/ TWL LRG LVL3 (GOWN DISPOSABLE) ×2 IMPLANT
IV LR IRRIG 3000ML ARTHROMATIC (IV SOLUTION) ×4 IMPLANT
KIT TURNOVER KIT A (KITS) ×2 IMPLANT
MANIFOLD NEPTUNE II (INSTRUMENTS) ×2 IMPLANT
MAT ABSORB FLUID 56X50 GRAY (MISCELLANEOUS) ×2 IMPLANT
PACK ARTHROSCOPY KNEE (MISCELLANEOUS) ×2 IMPLANT
PAD ABD DERMACEA PRESS 5X9 (GAUZE/BANDAGES/DRESSINGS) ×2 IMPLANT
PAD ARMBOARD 7.5X6 YLW CONV (MISCELLANEOUS) ×2 IMPLANT
SHAVER BLADE INCISOR PLUS 4.5 (BLADE) IMPLANT
STRIP CLOSURE SKIN 1/2X4 (GAUZE/BANDAGES/DRESSINGS) ×2 IMPLANT
SUT ETHILON 3-0 (SUTURE) IMPLANT
SUT MNCRL 3-0 UNDYED SH (SUTURE) ×2 IMPLANT
TUBE SET DOUBLEFLO INFLOW (TUBING) ×2 IMPLANT
WAND WEREWOLF FLOW 90D (MISCELLANEOUS) IMPLANT

## 2023-05-11 NOTE — Anesthesia Procedure Notes (Signed)
Procedure Name: LMA Insertion Date/Time: 05/11/2023 3:45 PM  Performed by: Irving Burton, CRNAPre-anesthesia Checklist: Patient identified, Patient being monitored, Timeout performed, Emergency Drugs available and Suction available Patient Re-evaluated:Patient Re-evaluated prior to induction Oxygen Delivery Method: Circle system utilized Preoxygenation: Pre-oxygenation with 100% oxygen Induction Type: IV induction Ventilation: Mask ventilation without difficulty LMA: LMA inserted LMA Size: 5.0 Tube type: Oral Number of attempts: 1 Placement Confirmation: positive ETCO2 and breath sounds checked- equal and bilateral Tube secured with: Tape Dental Injury: Teeth and Oropharynx as per pre-operative assessment

## 2023-05-11 NOTE — Consult Note (Addendum)
WOC Nurse Consult Note: Reason for Consult: Consult requested for left foot.  Ortho service is following for assessment and plan of care to left knee.  Left outer foot with dry calloused crusted skin, raised above skin level, no open wound or drainage.  Left plantar foot with chronic full thickness wound; .8X.8X.5cm, yellow and dry without odor or drainage, dry calloused edges.  Dressing procedure/placement/frequency: Topical treatment orders provided for bedside nurses to perform as follows to promote moist healing: Apply Bactroban to left plantar foot wound Q day, then cover with foam dressing. Change foam dressing Q 3 days or PRN soiling. Please re-consult if further assistance is needed.  Thank-you,  Cammie Mcgee MSN, RN, CWOCN, [place], CNS [phone]

## 2023-05-11 NOTE — Plan of Care (Signed)
  Problem: Clinical Measurements: Goal: Ability to avoid or minimize complications of infection will improve Outcome: Progressing   Problem: Skin Integrity: Goal: Skin integrity will improve Outcome: Progressing   Problem: Nutrition: Goal: Adequate nutrition will be maintained Outcome: Progressing   Problem: Elimination: Goal: Will not experience complications related to bowel motility Outcome: Progressing Goal: Will not experience complications related to urinary retention Outcome: Progressing   Problem: Pain Managment: Goal: General experience of comfort will improve and/or be controlled Outcome: Progressing   Problem: Safety: Goal: Ability to remain free from injury will improve Outcome: Progressing   Problem: Clinical Measurements: Goal: Ability to avoid or minimize complications of infection will improve 05/11/2023 0307 by Ike Bene, RN Outcome: Progressing 05/11/2023 0306 by Ike Bene, RN Outcome: Progressing   Problem: Nutrition: Goal: Adequate nutrition will be maintained Outcome: Progressing   Problem: Elimination: Goal: Will not experience complications related to bowel motility Outcome: Progressing Goal: Will not experience complications related to urinary retention Outcome: Progressing     Problem: Safety: Goal: Ability to remain free from injury will improve Outcome: Progressing

## 2023-05-11 NOTE — Anesthesia Preprocedure Evaluation (Signed)
Anesthesia Evaluation  Patient identified by MRN, date of birth, ID band Patient awake    Reviewed: Allergy & Precautions, H&P , NPO status , Patient's Chart, lab work & pertinent test results  History of Anesthesia Complications (+) history of anesthetic complications  Airway Mallampati: III  TM Distance: >3 FB Neck ROM: Full    Dental no notable dental hx. (+) Edentulous Upper, Edentulous Lower, Dental Advidsory Given   Pulmonary neg pulmonary ROS, COPD, Current Smoker and Patient abstained from smoking.   Pulmonary exam normal breath sounds clear to auscultation       Cardiovascular hypertension, + CAD, + Peripheral Vascular Disease and +CHF  negative cardio ROS Normal cardiovascular exam Rhythm:Regular Rate:Normal  05-25-19    1. Left ventricular ejection fraction, by estimation, is 50 to 55%. The  left ventricle has low normal function. The left ventricle has no regional  wall motion abnormalities. There is moderate left ventricular hypertrophy.  Left ventricular diastolic  parameters are consistent with Grade I diastolic dysfunction (impaired  relaxation).   2. Right ventricular systolic function is normal. The right ventricular  size is normal. Tricuspid regurgitation signal is inadequate for assessing  PA pressure.  PAD s/p right BKA s/p balloon angioplasty to the left peroneal artery, left posterior tibial artery, tibioperoneal trunk, and stenting of the distal left SFA in 07/2017 Nonobstructive coronary artery disease,  cardiomyopathy ejection fraction 25% on echocardiogram,  smoker hyperlipidemia, diabetes, hypertension, presenting to the hospital with diabetic foot ulcer/infection, Has prior below-knee amputation right lower extremity one month ago in [place] Long-standing ulceration left foot with no healing Does not ambulate much at baseline Started on Zosyn and vancomycin, Was being prepped for debridement  left foot with Dr. Ether Griffins PEA arrest Heart stopped x 2 during preparation for toe amptation surgery, per notes from Epic   Neuro/Psych  PSYCHIATRIC DISORDERS  Depression     Neuromuscular disease negative neurological ROS  negative psych ROS   GI/Hepatic negative GI ROS, Neg liver ROS,,,  Endo/Other  negative endocrine ROSdiabetes    Renal/GU Renal diseasenegative Renal ROS  negative genitourinary   Musculoskeletal negative musculoskeletal ROS (+)    Abdominal   Peds negative pediatric ROS (+)  Hematology negative hematology ROS (+)   Anesthesia Other Findings Past Medical History: No date: Abnormal posture No date: CHF (congestive heart failure) (HCC) No date: Chronic kidney disease     Comment:  CKD   stage 2 No date: Complication of anesthesia     Comment:  heart stopped x2 during toe amputation No date: COPD (chronic obstructive pulmonary disease) (HCC) No date: Coronary artery disease, non-occlusive     Comment:  a. LHC 05/2011: 10-20% ostial LAD stenosis No date: Depression No date: Diabetes mellitus No date: Grade I diastolic dysfunction No date: HLD (hyperlipidemia) No date: Hypertension No date: Muscle weakness No date: Neuromuscular disorder (HCC)     Comment:  neuropathy No date: NICM (nonischemic cardiomyopathy) (HCC)     Comment:  a. TTE 2/213: EF 50-55%, mild LVH, normal wall motion,               Gr2DD, mildly dilated LA; b. TTE 2/13: EF 40-45%, mild               concentric LVH, global HK, no evidence of LV thrombus; c.              TTE 4/19: EF 40%, diffuse HK, Gr2DD, mild MR, mildly  dilated LA, RV cavity size normal w/ normal RVSF, PASP               63; d. TTE 4/19: EF 25-30%, diffuse HK, mild LVH, mild               MR, mild PASP; e. TTE 5/19: EF 20-25%, diffuse HK,               moderate MR, No date: NSVT (nonsustained ventricular tachycardia) (HCC) No date: PAD (peripheral artery disease) (HCC)     Comment:  a. s/p right BKA  s/p  recent balloon angioplasty fo the               left peroneal artery, left posterior tibial artery,               tibioperoneal trunk, and stenting to the distal left SFA               in 07/2017 No date: PEA (Pulseless electrical activity) (HCC)     Comment:  heart stopped beating during surgery for amputation of               toes  Past Surgical History: 07/02/2017: AMPUTATION; Right     Comment:  Procedure: RIGHT BELOW KNEE AMPUTATION;  Surgeon: Nadara Mustard, MD;  Location: [facility] OR;  Service: Orthopedics;                Laterality: Right; 05/11/2021: AMPUTATION TOE; Left     Comment:  Procedure: LEFT GREAT  TOE AMPUTATION;  Surgeon: Gwyneth Revels, DPM;  Location: [facility] ORS;  Service: Podiatry;                Laterality: Left; No date: CARDIAC CATHETERIZATION 11/08/2018: CATARACT EXTRACTION W/PHACO; Left     Comment:  Procedure: CATARACT EXTRACTION PHACO AND INTRAOCULAR               LENS PLACEMENT (IOC)  LEFT VISION BLUE;  Surgeon: Elliot Cousin, MD;  Location: [facility] ORS;  Service: Ophthalmology;               Laterality: Left;  [place] 03:38.9 CDE 42.37 Fluid pack lot               # [id number] H 03/25/2023: CATARACT EXTRACTION W/PHACO; Right     Comment:  Procedure: CATARACT EXTRACTION PHACO AND INTRAOCULAR               LENS PLACEMENT (IOC) RIGHT DIABETIC MALYUGIN OMIDRIA               24.82 02:08.6;  Surgeon: Estanislado Pandy, MD;                Location: [facility];  Service: Ophthalmology;                Laterality: Right; No date: cyst removal from  right leg about 6 years ago 08/10/2017: IRRIGATION AND DEBRIDEMENT FOOT; Left     Comment:  Procedure: IRRIGATION AND DEBRIDEMENT FOOT;  Surgeon:               Gwyneth Revels, DPM;  Location: [facility] ORS;  Service:               Podiatry;  Laterality:  Left; 05/11/2011: LEFT HEART CATHETERIZATION WITH CORONARY ANGIOGRAM; N/A     Comment:  Procedure: LEFT HEART CATHETERIZATION WITH  CORONARY               ANGIOGRAM;  Surgeon: Lennette Bihari, MD;  Location: [facility]               CATH LAB;  Service: Cardiovascular;  Laterality: N/A; 08/13/2017: LOWER EXTREMITY ANGIOGRAPHY; Left     Comment:  Procedure: Lower Extremity Angiography and PICC line               placement;  Surgeon: Annice Needy, MD;  Location: [facility]               INVASIVE CV LAB;  Service: Cardiovascular;  Laterality:               Left; No date: ruptured navel     Comment:  repair of No date: TOE AMPUTATION     Comment:  not sure of when this surgery occured, but his heart               stopped beating x 2 during surgery  BMI    Body Mass Index: 28.43 kg/m      Reproductive/Obstetrics negative OB ROS                             Anesthesia Physical Anesthesia Plan  ASA: 4  Anesthesia Plan: General   Post-op Pain Management:    Induction: Intravenous  PONV Risk Score and Plan: 2 and Ondansetron  Airway Management Planned: LMA  Additional Equipment:   Intra-op Plan:   Post-operative Plan:   Informed Consent: I have reviewed the patients History and Physical, chart, labs and discussed the procedure including the risks, benefits and alternatives for the proposed anesthesia with the patient or authorized representative who has indicated his/her understanding and acceptance.     Dental Advisory Given  Plan Discussed with: Anesthesiologist, CRNA and Surgeon  Anesthesia Plan Comments: (Patient consented for risks of anesthesia including but not limited to:  - adverse reactions to medications - damage to eyes, teeth, lips or other oral mucosa - nerve damage due to positioning  - sore throat or hoarseness - Damage to heart, brain, nerves, lungs, other parts of body or loss of life  Patient voiced understanding and assent.)        Anesthesia Quick Evaluation

## 2023-05-11 NOTE — Progress Notes (Signed)
Lab results and white count within the knee suggest a likely infection within the left knee joint.  Given the presence of likely septic arthritis I discussed with the patient and his family at bedside indications and alternatives to proceeding with a left knee arthroscopic irrigation debridement.  After reviewing the risk and benefits including but not limited to need for additional surgical intervention, persistent infection, damage to nerves, vessels, ligaments, tendons or cartilage, the patient and family agree with the plan to move forward with left knee irrigation and debridement with placement of a drain.

## 2023-05-11 NOTE — Hospital Course (Addendum)
Hospital course / significant events:   HPI: Miguel Harvey is a 78 y.o. male with medical history significant of HTN, HLD, DM, dCHF, COPD on 2L O2, CAD, depression, CKD-3A, PEA, NSVT, chronic left foot osteomyelitis, s/p right BKA, tobacco abuse, who presents to ED via [facility] from [facility] 05/10/23. Complaints left knee pain x3 days, constant, sharp, severe, nonradiating, aggravated by movement, notes swelling with warmth.  No fever or chills. He has chronic left foot ulcer without drainage. Facility has been treating for LLE cellulitis, got 2L NS at facility.   02/10: X-ray of left foot is negative for signs of osteomyelitis. XR knee no fx, (+)suprapatellar effusion, (+)tricompartmental arthritis.  02/11: ortho consulted, concern for septic joint based on aspiration results, to OR      Consultants:  Orthopedic surgery   Procedures/Surgeries: 05/11/23 arthroscopic washout L knee - Dr Audelia Acton       ASSESSMENT & PLAN:   Left knee pain d/t septic arthritis  [place] in OR 05/11/23  Antibiotics: Vancomycin and Rocephin pending fluid cultures Follow-up blood culture As needed Percocet, Tylenol for pain  Foot osteomyelitis, left  this is a chronic issue, the wound looks like scabbed, no active drainage. Patient is on broad antibiotics as above Wound care consult   Hyponatremia + Pseudohyponatremia d/t hyperglycemia - Sodium corrects to 128 (on BMP Na 122, Glc 335) Mental status normal.   Question fluid overload chronic HFpEF Cautious restart lasix pending postop BP urine sodium, urine osmolality, serum osmolality. Fluid restriction Sodium chloride tablet 1 g twice daily   Type II diabetes mellitus with hyperglycemia Recent A1c 9.8, patient is taking NovoLog, glargine insulin 10 units daily Sliding scale insulin increased today  Glargine insulin 7 units daily increased to 10    COPD (chronic obstructive pulmonary disease)   Stable Bronchodilators and as needed  Mucinex   HLD (hyperlipidemia) Lipitor   HTN (hypertension) IV hydralazine as needed Coreg Lasix Hold lisinopril   CAD (coronary artery disease) No chest pain Holding Plavix perioperatively, can restart tomorrow  Aspirin, Lipitor   Chronic diastolic CHF (congestive heart failure)   2D echo on 05/25/2019 showed EF of 50-55% with grade 1 diastolic dysfunction.  No leg edema JVD.  CHF is compensated, BNP elevated  continue Lasix   Chronic kidney disease, stage 3a Cautious lasix    TOBACCO DEPENDENCE Nicotine patch    Depression Continue home medications    overweight based on BMI: Body mass index is 28.43 kg/m.  Underweight - under 18  overweight - 25 to 29 obese - 30 or more Class 1 obesity: BMI of 30.0 to 34 Class 2 obesity: BMI of 35.0 to 39 Class 3 obesity: BMI of 40.0 to 49 Super Morbid Obesity: BMI 50-59 Super-super Morbid Obesity: BMI 60+ Significantly low or high BMI is associated with higher medical risk.  Weight management advised as adjunct to other disease management and risk reduction treatments    DVT prophylaxis: heparin IV fluids: no continuous IV fluids  Nutrition: NPO pend surgery  Central lines / invasive devices: none  Code Status: FULL CODE ACP documentation reviewed:  none on file in [facility]  TOC needs: TBD, expect back to his long term care  Barriers to dispo / significant pending items: surgery today, continuing IV abx pending cultures

## 2023-05-11 NOTE — Transfer of Care (Signed)
Immediate Anesthesia Transfer of Care Note  Patient: Miguel Harvey  Procedure(s) Performed: ARTHROSCOPIC IRRIGATION AND DEBRIDEMENT LEFT KNEE, SYNOVECTOMY (Left: Knee)  Patient Location: PACU  Anesthesia Type:General  Level of Consciousness: drowsy  Airway & Oxygen Therapy: Patient Spontanous Breathing and Patient connected to face mask oxygen  Post-op Assessment: Report given to RN  Post vital signs: stable  Last Vitals:  Vitals Value Taken Time  BP 128/72 05/11/23 1645  Temp    Pulse 73 05/11/23 1646  Resp 14 05/11/23 1646  SpO2 100 % 05/11/23 1646  Vitals shown include unfiled device data.  Last Pain:  Vitals:   05/11/23 1409  TempSrc: Temporal  PainSc: 4          Complications: No notable events documented.

## 2023-05-11 NOTE — Op Note (Signed)
Patient Name: Miguel Harvey  ZOX:[id number]  Pre-Operative Diagnosis: Left knee septic arthritis  Post-Operative Diagnosis: (same)  Procedure: Left knee arthroscopic irrigation debridement and partial synovectomy  Arthroscopic findings: Gross purulence throughout all 3 compartments with significant synovitis and tricompartmental degenerative changes with grade 4 changes in all 3 compartments with bone-on-bone articulation and complex tears of the medial and lateral meniscus.  ACL appeared intact.  Components/Implants: None  Date of Surgery: 05/11/2023  Surgeon: Reinaldo Berber MD  Assistant: None   Anesthesiologist: Laural Benes  Anesthesia: General   Tourniquet Time: 0  EBL: 10cc  IVF: 500cc  Complications: None   Brief history: The patient is a 78 year old male with a history of left knee with pain with an effusion and aspiration concerning for septic arthritis.  Gram stain showed gram-positive cocci and the cell count was greater than 150,000.  After discussing treatment options and the risks and benefits with the patient and his daughter they agree with the plan move forward with left knee arthroscopic irrigation debridement with placement of a drain.  All preoperative imaging was reviewed and appropriate surgical plan was made prior to surgery.   Description of procedure: The patient was brought to the operating room where laterality was confirmed by all those present to be the left side.   General anesthesia was administered and the patient received an intravenous dose of antibiotics for surgical prophylaxis.  Patient is positioned supine on the operating room table with all bony prominences well-padded.  A well-padded tourniquet was applied to the left thigh and the patient was Supine with the leg on a regular table. The knee was then prepped and draped in usual sterile fashion with multiple layers of adhesive and nonadhesive drapes.  All of those present in the operating room  participated in a surgical timeout laterality and patient were confirmed.    A standard lateral arthroscopic portal was obtained utilizing spinal needle and an 11 blade.  The arthroscopic camera was introduced into the knee and the knee was insufflated with saline.  A diagnostic arthroscopy was then attempted however the knee was completely filled with purulence so a second portal was immediately established in the medial position under direct visualization.  A superior lateral portal was then established for a outflow portal.  The knee was then irrigated with approximately 3 L of normal saline which allowed for a clearing up of the view and a full diagnostic was performed.  Grade 4 changes were found in all 3 compartments with full-thickness cartilage loss amongst all 3 compartments with osteophyte formation and medial and lateral meniscus tears.  The ACL was fibrillated but intact and in continuity.  A partial synovectomy of all 3 compartments was then performed utilizing shaver.  All the gutters and posterior aspect of the knee were accessed as much as possible in order to flush out the areas and remove any debris and excess synovitis.  After running 6 L of antibiotic saline through the knee no more purulence was encountered.  The outflow portal was then used to swapped for a Hemovac drain under direct visualization.    The knee was irrigated with saline. the portals were closed with 3-0 nylon sutures, Xeroform.  The knee was wrapped with sterile 4 x 4's, ABDs, Webril, and Ace wrap.  Sponge, needle, and Lap counts were all correct at the end of the case.   The patient was transferred off of the operating room table to a hospital bed, good pulses were found distally on the operative  side.  The patient was transferred to the recovery room in stable condition.

## 2023-05-11 NOTE — Progress Notes (Signed)
ORTHOPAEDIC CONSULTATION  REQUESTING PHYSICIAN: Sunnie Nielsen, DO  Chief Complaint:   Left knee effusion  History of Present Illness: Miguel Harvey is a 78 y.o. male  with medical history significant of HTN, HLD, DM, dCHF, COPD on 2L O2, CAD, depression, CKD-3A, PEA, NSVT, chronic left foot osteomyelitis, s/p of right BKA, tobacco abuse who presented with weakness and was found to have some left knee pain.  Patient reports that his left knee has hurt him for 5 years and that the pain is slightly worsened over the last 3 days.  He reports sharp nagging aching pains over the medial aspect of his knee with intermittent swelling to the knee which has occurred in the past.  Denies any significant change in his knee as it has been over the last few years and that he does not walk.  Patient does report that his knee is hurting him at this time and has felt like this before.  He denies any drainage or wounds on his left leg that are new.  He does have a chronic ulcer over the left foot which has scabbed over and patient reports is unchanged.  He does report some chronic numbness to that foot.  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  Past Medical History:  Diagnosis Date   Abnormal posture    CHF (congestive heart failure) (HCC)    Chronic kidney disease    CKD   stage 2   Complication of anesthesia    heart stopped x2 during toe amputation   COPD (chronic obstructive pulmonary disease) (HCC)    Coronary artery disease, non-occlusive    a. LHC 05/2011: 10-20% ostial LAD stenosis   Depression    Diabetes mellitus    Grade I diastolic dysfunction    HLD (hyperlipidemia)    Hypertension    Muscle weakness    Neuromuscular disorder (HCC)    neuropathy   NICM (nonischemic cardiomyopathy) (HCC)    a. TTE 2/213: EF 50-55%, mild LVH, normal wall motion, Gr2DD, mildly dilated LA; b. TTE 2/13: EF 40-45%, mild  concentric LVH, global HK, no evidence of LV thrombus; c. TTE 4/19: EF 40%, diffuse HK, Gr2DD, mild MR, mildly dilated LA, RV cavity size normal w/ normal RVSF, PASP 63; d. TTE 4/19: EF 25-30%, diffuse HK, mild LVH, mild MR, mild PASP; e. TTE 5/19: EF 20-25%, diffuse HK, moderate MR,   NSVT (nonsustained ventricular tachycardia) (HCC)    PAD (peripheral artery disease) (HCC)    a. s/p right BKA s/p  recent balloon angioplasty fo the left peroneal artery, left posterior tibial artery, tibioperoneal trunk, and stenting to the distal left SFA in 07/2017   PEA (Pulseless electrical activity) (HCC)    heart stopped beating during surgery for amputation of toes   Past Surgical History:  Procedure Laterality Date   AMPUTATION Right 07/02/2017   Procedure: RIGHT BELOW KNEE AMPUTATION;  Surgeon: Nadara Mustard, MD;  Location: [facility];  Service: Orthopedics;  Laterality: Right;   AMPUTATION TOE Left 05/11/2021   Procedure: LEFT GREAT  TOE AMPUTATION;  Surgeon: Gwyneth Revels, DPM;  Location: [facility];  Service: Podiatry;  Laterality: Left;   CARDIAC CATHETERIZATION     CATARACT EXTRACTION W/PHACO Left 11/08/2018   Procedure: CATARACT EXTRACTION PHACO AND INTRAOCULAR LENS PLACEMENT (IOC)  LEFT VISION BLUE;  Surgeon: Elliot Cousin, MD;  Location: [facility];  Service: Ophthalmology;  Laterality: Left;  [place] 03:38.9 CDE 42.37 Fluid pack lot # [id number]  CATARACT EXTRACTION W/PHACO Right 03/25/2023   Procedure: CATARACT EXTRACTION PHACO AND INTRAOCULAR LENS PLACEMENT (IOC) RIGHT DIABETIC MALYUGIN OMIDRIA 24.82 02:08.6;  Surgeon: Estanislado Pandy, MD;  Location: [facility];  Service: Ophthalmology;  Laterality: Right;   cyst removal from  right leg about 6 years ago     IRRIGATION AND DEBRIDEMENT FOOT Left 08/10/2017   Procedure: IRRIGATION AND DEBRIDEMENT FOOT;  Surgeon: Gwyneth Revels, DPM;  Location: [facility];  Service: Podiatry;  Laterality: Left;   LEFT HEART CATHETERIZATION WITH CORONARY ANGIOGRAM  N/A 05/11/2011   Procedure: LEFT HEART CATHETERIZATION WITH CORONARY ANGIOGRAM;  Surgeon: Lennette Bihari, MD;  Location: [facility] C[facility];  Service: Cardiovascular;  Laterality: N/A;   LOWER EXTREMITY ANGIOGRAPHY Left 08/13/2017   Procedure: Lower Extremity Angiography and PICC line placement;  Surgeon: Annice Needy, MD;  Location: [facility] INVASIVE CV LAB;  Service: Cardiovascular;  Laterality: Left;   ruptured navel     repair of   TOE AMPUTATION     not sure of when this surgery occured, but his heart stopped beating x 2 during surgery   Social History   Socioeconomic History   Marital status: Single    Spouse name: danielle .... daughter   Number of children: Not on file   Years of education: Not on file   Highest education level: Not on file  Occupational History   Not on file  Tobacco Use   Smoking status: Every Day    Current packs/day: 0.00    Types: Cigarettes   Smokeless tobacco: Former    Quit date: 05/07/2011   Tobacco comments:    3 cigs per day (previous hx of smoking 1 PPD)  Vaping Use   Vaping status: Never Used  Substance and Sexual Activity   Alcohol use: Not Currently    Alcohol/week: 6.0 standard drinks of alcohol    Types: 6 Cans of beer per week   Drug use: Never   Sexual activity: Not on file  Other Topics Concern   Not on file  Social History Narrative   Not on file   Social Drivers of Health   Financial Resource Strain: Not on file  Food Insecurity: No Food Insecurity (05/11/2023)   Hunger Vital Sign    Worried About Running Out of Food in the Last Year: Never true    Ran Out of Food in the Last Year: Never true  Transportation Needs: No Transportation Needs (05/11/2023)   PRAPARE - Administrator, Civil Service (Medical): No    Lack of Transportation (Non-Medical): No  Physical Activity: Inactive (08/05/2017)   Exercise Vital Sign    Days of Exercise per Week: 0 days    Minutes of Exercise per Session: 0 min  Stress: No Stress Concern Present  (08/05/2017)   [employer] of Occupational Health - Occupational Stress Questionnaire    Feeling of Stress : Not at all  Social Connections: Socially Isolated (05/11/2023)   Social Connection and Isolation Panel [NHANES]    Frequency of Communication with Friends and Family: Patient declined    Frequency of Social Gatherings with Friends and Family: Three times a week    Attends Religious Services: Never    Active Member of Clubs or Organizations: No    Attends Banker Meetings: Never    Marital Status: Never married   Family History  Problem Relation Age of Onset   CAD Other    Diabetes Other    Allergies  Allergen Reactions  Spironolactone     Hyperkalemia (currently takes spironolactone)   Prior to Admission medications   Medication Sig Start Date End Date Taking? Authorizing Provider  acetaminophen (TYLENOL) 325 MG tablet Take 2 tablets (650 mg total) by mouth every 6 (six) hours as needed for mild pain (or Fever >/= 101). Patient not taking: Reported on 12/28/2022 05/14/21   Leeroy Bock, MD  aspirin EC 81 MG EC tablet Take 1 tablet (81 mg total) by mouth daily. Patient not taking: Reported on 12/28/2022 08/14/17   Enedina Finner, MD  atorvastatin (LIPITOR) 20 MG tablet Take 20 mg by mouth every evening.    [provider]  carboxymethylcellulose (REFRESH PLUS) 0.5 % SOLN Place 1 drop into both eyes 4 (four) times daily.    [provider]  carvedilol (COREG) 6.25 MG tablet Take 6.25 mg by mouth 2 (two) times daily with a meal. Patient not taking: Reported on 12/28/2022    [provider]  clopidogrel (PLAVIX) 75 MG tablet Take 1 tablet (75 mg total) by mouth daily. 08/14/17   Enedina Finner, MD  Continuous Glucose Receiver (DEXCOM G7 RECEIVER) DEVI 1 Device by Does not apply route continuous. 02/18/23   Motwani, Carin Hock, MD  Continuous Glucose Sensor (DEXCOM G7 SENSOR) MISC 1 Device by Does not apply route continuous. 02/18/23   Altamese [place], MD  Cyanocobalamin (B-12 PO) Take by mouth daily at 2 am.    [provider]  Dulaglutide (TRULICITY) 1.5 MG/0.5ML SOPN Inject 1.5 mg into the skin every Friday. Patient not taking: Reported on 12/28/2022    [provider]  furosemide (LASIX) 20 MG tablet Take 20 mg by mouth daily.    [provider]  guaiFENesin (ROBITUSSIN) 100 MG/5ML liquid Take 5 mLs by mouth every 4 (four) hours as needed for cough or to loosen phlegm. Patient not taking: Reported on 12/28/2022    [provider]  insulin glargine, 2 Unit Dial, (TOUJEO MAX SOLOSTAR) 300 UNIT/ML Solostar Pen Inject 10 Units into the skin daily at 6 (six) AM.    [provider]  insulin lispro (HUMALOG) 100 UNIT/ML KwikPen Inject 10-13 Units into the skin 3 (three) times daily. HOLD IF CBG < or = 150 04/05/19   [provider]  Insulin Lispro-aabc (LYUMJEV) 100 UNIT/ML SOLN Inject 5 Units as directed daily at 6 (six) AM. BASED ON SLIDING SCALE    [provider]  ipratropium-albuterol (DUONEB) 0.5-2.5 (3) MG/3ML SOLN Take by nebulization as needed. Patient not taking: Reported on 12/28/2022 08/26/21   [provider]  latanoprost (XALATAN) 0.005 % ophthalmic solution Place 1 drop into both eyes at bedtime.    [provider]  linagliptin (TRADJENTA) 5 MG TABS tablet Take 5 mg by mouth daily.    [provider]  lisinopril (ZESTRIL) 5 MG tablet Take 5 mg by mouth daily.    [provider]  Magnesium 400 MG TABS Take 400 mg by mouth 2 (two) times daily.     [provider]  Multiple Vitamin (MULTIVITAMIN WITH MINERALS) TABS tablet Take 1 tablet by mouth daily.    [provider]  sertraline (ZOLOFT) 50 MG tablet Take 50 mg by mouth daily.    [provider]  traZODone (DESYREL) 50 MG tablet Take 0.5 mg by mouth at bedtime. Patient not taking: Reported on 02/18/2023    [provider]   DG Knee 1-2 Views  Left Result Date: 05/11/2023 CLINICAL DATA:  78 year old male with history of  left-sided knee pain. EXAM: LEFT KNEE - 1-2 VIEW COMPARISON:  No priors. FINDINGS: Two views of the left knee demonstrate a small suprapatellar effusion. No acute displaced fracture. There is joint space narrowing, subchondral sclerosis, subchondral cyst formation and osteophyte formation noted in a tricompartmental distribution, most severe in the medial and patellofemoral compartments, indicative of osteoarthritis. IMPRESSION: 1. Suprapatellar joint effusion. 2. No evidence of acute displaced fracture. 3. 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 Electronically Signed   By: Trudie Reed M.D.   On: 05/11/2023 06:15   DG Chest Port 1 View Result Date: 05/10/2023 CLINICAL DATA:  Fever.  Forefoot cellulitis. EXAM: PORTABLE CHEST 1 VIEW COMPARISON:  05/08/2021 FINDINGS: Heart size is normal. Mediastinal shadows are normal. No evidence of consolidation or lobar collapse. No visible effusion. IMPRESSION: No active disease. Electronically Signed   By: Paulina Fusi M.D.   On: 05/10/2023 21:09   DG Foot 2 Views Left Result Date: 05/10/2023 CLINICAL DATA:  Left lower extremity cellulitis. EXAM: LEFT FOOT - 2 VIEW COMPARISON:  MRI 05/09/2021 FINDINGS: Previous forefoot amputation. Some air/gas apparently in the soft tissues along the anterior margin. No radiographic sign of osteomyelitis or lytic destruction. No periosteal elevation. IMPRESSION: Previous forefoot amputation. Some air/gas apparently in the soft tissues along the anterior margin. No radiographic sign of osteomyelitis. Electronically Signed   By: Paulina Fusi M.D.   On: 05/10/2023 21:07    Positive ROS: All other systems have been reviewed and were otherwise negative with the exception of those mentioned in the HPI and as above.  Physical Exam: General:  Alert, no acute distress Psychiatric:  Patient is competent for  consent with normal mood and affect   Cardiovascular:  No pedal edema Respiratory:  No wheezing, non-labored breathing GI:  Abdomen is soft and non-tender Skin:  No lesions in the area of chief complaint Neurologic:  Sensation intact distally Lymphatic:  No axillary or cervical lymphadenopathy  Orthopedic Exam:  Left lower extremity Skin intact over the knee questionable small area of erythema over the medial knee however that is where he is leaning his knee on his other leg Mild warmth symmetric to the contralateral knee Moderate effusion palpable Tender along the joint line and with hyperesthesia to any light palpation anywhere on the leg No pain with micromotion of the leg pain with end range of motion flexion contracture about 10 degrees taken from 10 degrees to about 75 degrees of motion Capillary refill intact over the stump thickened coarse skin with an intact eschar at a prior ulcer site no active signs of infection over the foot Able to dorsiflex and plantarflex the foot compartments all soft    X-rays:  X-rays reviewed which shows severe tricompartmental arthritis of the left knee agrees radiology interpretation.  No evidence of any fractures or dislocations or any concerning bone lesions.  Assessment: Left knee effusion left knee osteoarthritis possible left knee gout versus septic arthritis  Plan: I reviewed the findings with the patient including the x-ray findings and clinical exam findings.  He does have an effusion with some warmth and in the setting of his elevated inflammatory markers and white count and no other potential source it is reasonable to rule out left knee septic arthritis as a possible underlying cause versus gout flare in the left knee.  I discussed the risks and benefits of doing a left knee aspiration with the patient including the risk of introducing infection, damage to nerves vessels or other structures,  and he gave verbal consent for the  procedure.  Left knee was prepped with alcohol, chlorhexidine, and DuraPrep and using sterile technique the skin was anesthetized with ethyl chloride and 80 cc of chalky tan fluid were aspirated from the knee.  And a sterile adhesive bandage and a Ace wrap was then applied and the patient tolerated the procedure without any complications.   We will send the fluid off for crystal analysis, cell count and culture growth.  I do anticipate a elevated cell count however we will keep an eye on how high it goes given the fluid did look clinically like gout.  Will keep the patient n.p.o. for now pending the initial results from the fluid and we will make a decision on next steps after that results.  Meantime I will continue with medical management and antibiotics.    Reinaldo Berber MD  Beeper #:  [phone]  05/11/2023 8:35 AM

## 2023-05-11 NOTE — Inpatient Diabetes Management (Signed)
Inpatient Diabetes Program Recommendations  AACE/ADA: New Consensus Statement on Inpatient Glycemic Control   Target Ranges:  Prepandial:   less than 140 mg/dL      Peak postprandial:   less than 180 mg/dL (1-2 hours)      Critically ill patients:  140 - 180 mg/dL    Latest Reference Range & Units 05/11/23 07:16  Glucose 70 - 99 mg/dL 952 (H)    Latest Reference Range & Units 05/10/23 15:17 05/10/23 23:35  Glucose-Capillary 70 - 99 mg/dL 841 (H) 324 (H)   Review of Glycemic Control  Diabetes history: DM2 Outpatient Diabetes medications: Toujeo 10 units daily at 6am, Lyumjev 5 units at 6am, Humalog 10-13 units TID, Tradjenta 5 mg daily, Trulicity 1.5 mg Qweek (not taking), Dexcom G7 CGM Current orders for Inpatient glycemic control: Semglee 7 uints daily, Novolog 0-9 units TID with meals, Novolog 0-5 units QHS  Inpatient Diabetes Program Recommendations:    Insulin: Lab glucose 335 mg/dl this morning. No Semglee given yet; scheduled for 10am today.  NOTE: Patient with DM2 hx admitted from SNF with left knee pain, left foot osteomyelitis, and hyponatremia. In reviewing chart, noted patient sees [facility] and was seen by Dr. Roosevelt Locks on 02/18/23. Per office note on 02/18/23, patient was prescribed Toujeo 20 units at bedtime, Lyumjev 8 units TID with meals (hold if CBG less than 100 mg/dl), and Tradjenta 5 mg daily for DM control.  Thanks, Orlando Penner, RN, MSN, CDCES Diabetes Coordinator Inpatient Diabetes Program [phone] (Team Pager from 8am to 5pm)

## 2023-05-11 NOTE — Consult Note (Signed)
Pharmacy Antibiotic Note Miguel Harvey is a 78 y.o. male admitted on 05/10/2023 with  generalized weakness .  Patient looks to have LLE cellulitis, as well.Pharmacy has been consulted for Vancomycin dosing. Patient is also ordered Ceftriaxone 2g IV Q24 hours  Plan: Given improvement in renal function, increase vancomycin dose to 1500 mg IV Q24H. Goal AUC 400-550. Expected AUC: 491.3 Expected Cmin: 12.2, Vd used: 0.72 SCr used: 1.38   Height: 6' (182.9 cm) Weight: 95.1 kg (209 lb 10.5 oz) IBW/kg (Calculated) : 77.6  Temp (24hrs), Avg:97.9 F (36.6 C), Min:97.6 F (36.4 C), Max:98.2 F (36.8 C)  Recent Labs  Lab 05/10/23 1502 05/10/23 2026 05/11/23 0716  WBC 13.7*  --  17.8*  CREATININE 1.58*  --  1.38*  LATICACIDVEN  --  1.5  --     Estimated Creatinine Clearance: 52.8 mL/min (A) (by C-G formula based on SCr of 1.38 mg/dL (H)).    Allergies  Allergen Reactions   Spironolactone     Hyperkalemia (currently takes spironolactone)    Antimicrobials this admission: Vancomycin 2/11 >>  Ceftriaxone 2/11 >>   Dose adjustments this admission: N/A  Microbiology results: 2/10 BCx: IP   Thank you for allowing pharmacy to be a part of this patient's care.  Merryl Hacker, PharmD Clinical Pharmacist  05/11/2023 8:39 AM

## 2023-05-11 NOTE — Plan of Care (Signed)
Problem: Education: Goal: Ability to describe self-care measures that may prevent or decrease complications (Diabetes Survival Skills Education) will improve Outcome: Progressing Goal: Individualized Educational Video(s) Outcome: Progressing   Problem: Coping: Goal: Ability to adjust to condition or change in health will improve Outcome: Progressing   Problem: Fluid Volume: Goal: Ability to maintain a balanced intake and output will improve Outcome: Progressing   Problem: Health Behavior/Discharge Planning: Goal: Ability to identify and utilize available resources and services will improve Outcome: Progressing Goal: Ability to manage health-related needs will improve Outcome: Progressing   Problem: Metabolic: Goal: Ability to maintain appropriate glucose levels will improve Outcome: Progressing   Problem: Nutritional: Goal: Maintenance of adequate nutrition will improve Outcome: Progressing Goal: Progress toward achieving an optimal weight will improve Outcome: Progressing   Problem: Skin Integrity: Goal: Risk for impaired skin integrity will decrease Outcome: Progressing   Problem: Tissue Perfusion: Goal: Adequacy of tissue perfusion will improve Outcome: Progressing   Problem: Education: Goal: Knowledge of General Education information will improve Description: Including pain rating scale, medication(s)/side effects and non-pharmacologic comfort measures Outcome: Progressing   Problem: Health Behavior/Discharge Planning: Goal: Ability to manage health-related needs will improve Outcome: Progressing   Problem: Clinical Measurements: Goal: Ability to maintain clinical measurements within normal limits will improve Outcome: Progressing Goal: Will remain free from infection Outcome: Progressing Goal: Diagnostic test results will improve Outcome: Progressing Goal: Respiratory complications will improve Outcome: Progressing Goal: Cardiovascular complication will  be avoided Outcome: Progressing   Problem: Activity: Goal: Risk for activity intolerance will decrease Outcome: Progressing   Problem: Nutrition: Goal: Adequate nutrition will be maintained Outcome: Progressing   Problem: Coping: Goal: Level of anxiety will decrease Outcome: Progressing   Problem: Elimination: Goal: Will not experience complications related to bowel motility Outcome: Progressing Goal: Will not experience complications related to urinary retention Outcome: Progressing   Problem: Pain Managment: Goal: General experience of comfort will improve and/or be controlled Outcome: Progressing   Problem: Safety: Goal: Ability to remain free from injury will improve Outcome: Progressing   Problem: Skin Integrity: Goal: Risk for impaired skin integrity will decrease Outcome: Progressing   Problem: Clinical Measurements: Goal: Ability to avoid or minimize complications of infection will improve Outcome: Progressing   Problem: Skin Integrity: Goal: Skin integrity will improve Outcome: Progressing   Problem: Education: Goal: Knowledge of General Education information will improve Description: Including pain rating scale, medication(s)/side effects and non-pharmacologic comfort measures Outcome: Progressing   Problem: Health Behavior/Discharge Planning: Goal: Ability to manage health-related needs will improve Outcome: Progressing   Problem: Clinical Measurements: Goal: Ability to maintain clinical measurements within normal limits will improve Outcome: Progressing Goal: Will remain free from infection Outcome: Progressing Goal: Diagnostic test results will improve Outcome: Progressing Goal: Respiratory complications will improve Outcome: Progressing Goal: Cardiovascular complication will be avoided Outcome: Progressing   Problem: Activity: Goal: Risk for activity intolerance will decrease Outcome: Progressing   Problem: Nutrition: Goal: Adequate  nutrition will be maintained Outcome: Progressing   Problem: Coping: Goal: Level of anxiety will decrease Outcome: Progressing   Problem: Elimination: Goal: Will not experience complications related to bowel motility Outcome: Progressing Goal: Will not experience complications related to urinary retention Outcome: Progressing   Problem: Pain Managment: Goal: General experience of comfort will improve and/or be controlled Outcome: Progressing   Problem: Safety: Goal: Ability to remain free from injury will improve Outcome: Progressing   Problem: Skin Integrity: Goal: Risk for impaired skin integrity will decrease Outcome: Progressing   Problem: Education: Goal: Verbalization  of understanding the information provided (i.e., activity precautions, restrictions, etc) will improve Outcome: Progressing Goal: Individualized Educational Video(s) Outcome: Progressing

## 2023-05-11 NOTE — Progress Notes (Signed)
PROGRESS NOTE    Miguel Harvey   ZOX:[id number] DOB: 1945/08/14  DOA: 05/10/2023 Date of Service: 05/11/23 which is hospital day 0  PCP: Miguel Northern, MD    Hospital course / significant events:   HPI: Miguel Harvey is a 78 y.o. male with medical history significant of HTN, HLD, DM, dCHF, COPD on 2L O2, CAD, depression, CKD-3A, PEA, NSVT, chronic left foot osteomyelitis, s/p right BKA, tobacco abuse, who presents to ED via [facility] from [facility] 05/10/23. Complaints left knee pain x3 days, constant, sharp, severe, nonradiating, aggravated by movement, notes swelling with warmth.  No fever or chills. He has chronic left foot ulcer without drainage. Facility has been treating for LLE cellulitis, got 2L NS at facility.   02/10: X-ray of left foot is negative for signs of osteomyelitis. XR knee no fx, (+)suprapatellar effusion, (+)tricompartmental arthritis.  02/11: ortho consulted, concern for septic joint based on aspiration results, to OR      Consultants:  Orthopedic surgery   Procedures/Surgeries: 05/11/23 arthroscopic washout L knee - Dr Audelia Acton       ASSESSMENT & PLAN:   Left knee pain d/t septic arthritis  Washout in OR 05/11/23  Antibiotics: Vancomycin and Rocephin pending fluid cultures Follow-up blood culture As needed Percocet, Tylenol for pain  Foot osteomyelitis, left  this is a chronic issue, the wound looks like scabbed, no active drainage. Patient is on broad antibiotics as above Wound care consult   Hyponatremia + Pseudohyponatremia d/t hyperglycemia - Sodium corrects to 128 (on BMP Na 122, Glc 335) Mental status normal.   Question fluid overload chronic HFpEF Cautious restart lasix pending postop BP urine sodium, urine osmolality, serum osmolality. Fluid restriction Sodium chloride tablet 1 g twice daily   Type II diabetes mellitus with hyperglycemia Recent A1c 9.8, patient is taking NovoLog, glargine insulin 10 units daily Sliding scale  insulin increased today  Glargine insulin 7 units daily increased to 10    COPD (chronic obstructive pulmonary disease)   Stable Bronchodilators and as needed Mucinex   HLD (hyperlipidemia) Lipitor   HTN (hypertension) IV hydralazine as needed Coreg Lasix Hold lisinopril   CAD (coronary artery disease) No chest pain Holding Plavix perioperatively, can restart tomorrow  Aspirin, Lipitor   Chronic diastolic CHF (congestive heart failure)   2D echo on 05/25/2019 showed EF of 50-55% with grade 1 diastolic dysfunction.  No leg edema JVD.  CHF is compensated, BNP elevated  continue Lasix   Chronic kidney disease, stage 3a Cautious lasix    TOBACCO DEPENDENCE Nicotine patch    Depression Continue home medications    overweight based on BMI: Body mass index is 28.43 kg/m.  Underweight - under 18  overweight - 25 to 29 obese - 30 or more Class 1 obesity: BMI of 30.0 to 34 Class 2 obesity: BMI of 35.0 to 39 Class 3 obesity: BMI of 40.0 to 49 Super Morbid Obesity: BMI 50-59 Super-super Morbid Obesity: BMI 60+ Significantly low or high BMI is associated with higher medical risk.  Weight management advised as adjunct to other disease management and risk reduction treatments    DVT prophylaxis: heparin IV fluids: no continuous IV fluids  Nutrition: NPO pend surgery  Central lines / invasive devices: none  Code Status: FULL CODE ACP documentation reviewed:  none on file in [facility]  TOC needs: TBD, expect back to his long term care  Barriers to dispo / significant pending items: surgery today, continuing IV abx pending cultures  Subjective / Brief ROS:  Patient reports pain is better following knee aspiration  Denies CP/SOB.  Pain controlled.  Denies new weakness..  Reports no concerns w/ urination/defecation.   Family Communication: none at this time     Objective Findings:  Vitals:   05/10/23 2253 05/10/23 2324 05/11/23 0611 05/11/23  0806  BP: 121/68 133/76 (!) 145/79 (!) 153/78  Pulse: 77 74 85 81  Resp: 17 18 18 18   Temp:  98 F (36.7 C) 98.2 F (36.8 C) 97.6 F (36.4 C)  TempSrc:      SpO2: 98% 100% 94% 98%  Weight:  95.1 kg    Height:  6' (1.829 m)      Intake/Output Summary (Last 24 hours) at 05/11/2023 1408 Last data filed at 05/11/2023 0109 Gross per 24 hour  Intake 397.73 ml  Output --  Net 397.73 ml   Filed Weights   05/10/23 2324  Weight: 95.1 kg    Examination:  Physical Exam       Scheduled Medications:   [MAR Hold] aspirin EC  81 mg Oral Daily   [MAR Hold] atorvastatin  20 mg Oral QPM   [MAR Hold] carvedilol  6.25 mg Oral BID WC   [MAR Hold] furosemide  20 mg Oral Daily   [MAR Hold] heparin  5,000 Units Subcutaneous Q8H   [MAR Hold] insulin aspart  0-5 Units Subcutaneous QHS   [MAR Hold] insulin aspart  0-9 Units Subcutaneous TID WC   insulin aspart  5 Units Subcutaneous TID WC   [START ON 05/12/2023] insulin glargine-yfgn  10 Units Subcutaneous Daily   [MAR Hold] latanoprost  1 drop Both Eyes QHS   [MAR Hold] multivitamin with minerals  1 tablet Oral Daily   [MAR Hold] mupirocin cream   Topical Daily   [MAR Hold] nicotine  21 mg Transdermal Daily   [MAR Hold] polyvinyl alcohol  1 drop Both Eyes QID   [MAR Hold] sertraline  50 mg Oral Daily   [MAR Hold] sodium chloride  1 g Oral BID WC   [MAR Hold] vitamin B-12  100 mcg Oral Daily    Continuous Infusions:  [MAR Hold] cefTRIAXone (ROCEPHIN)  IV 2 g (05/11/23 0004)   [MAR Hold] vancomycin      PRN Medications:  [MAR Hold] acetaminophen, [MAR Hold] albuterol, [MAR Hold] dextromethorphan-guaiFENesin, [MAR Hold] hydrALAZINE, [MAR Hold] ondansetron (ZOFRAN) IV, [MAR Hold] oxyCODONE-acetaminophen, [MAR Hold] traZODone  Antimicrobials from admission:  Anti-infectives (From admission, onward)    Start     Dose/Rate Route Frequency Ordered Stop   05/12/23 0200  vancomycin (VANCOREADY) IVPB 1250 mg/250 mL  Status:  Discontinued         1,250 mg 166.7 mL/hr over 90 Minutes Intravenous Every 24 hours 05/11/23 0113 05/11/23 0840   05/12/23 0200  [MAR Hold]  vancomycin (VANCOREADY) IVPB 1500 mg/300 mL        (MAR Hold since Tue 05/11/2023 at 1405.Hold Reason: Transfer to a Procedural area)   1,500 mg 150 mL/hr over 120 Minutes Intravenous Every 24 hours 05/11/23 0840     05/11/23 0045  vancomycin (VANCOCIN) IVPB 1000 mg/200 mL premix        1,000 mg 200 mL/hr over 60 Minutes Intravenous  Once 05/10/23 2350 05/11/23 0209   05/10/23 2245  [MAR Hold]  cefTRIAXone (ROCEPHIN) 2 g in sodium chloride 0.9 % 100 mL IVPB        (MAR Hold since Tue 05/11/2023 at 1405.Hold Reason: Transfer to a Procedural area)  2 g 200 mL/hr over 30 Minutes Intravenous Every 24 hours 05/10/23 2239     05/10/23 2200  vancomycin (VANCOCIN) IVPB 1000 mg/200 mL premix        1,000 mg 200 mL/hr over 60 Minutes Intravenous  Once 05/10/23 2152 05/10/23 2341           Data Reviewed:  I have personally reviewed the following...  CBC: Recent Labs  Lab 05/10/23 1502 05/11/23 0716  WBC 13.7* 17.8*  HGB 9.6* 10.2*  HCT 28.9* 29.4*  MCV 83.3 81.4  PLT 300 315   Basic Metabolic Panel: Recent Labs  Lab 05/10/23 1502 05/10/23 2349 05/11/23 0716  NA 124*  --  122*  K 4.1  --  4.3  CL 92*  --  89*  CO2 21*  --  21*  GLUCOSE 245*  --  335*  BUN 44*  --  42*  CREATININE 1.58*  --  1.38*  CALCIUM 7.6*  --  7.6*  MG  --  2.9*  --    GFR: Estimated Creatinine Clearance: 52.8 mL/min (A) (by C-G formula based on SCr of 1.38 mg/dL (H)). Liver Function Tests: No results for input(s): "AST", "ALT", "ALKPHOS", "BILITOT", "PROT", "ALBUMIN" in the last 168 hours. No results for input(s): "LIPASE", "AMYLASE" in the last 168 hours. No results for input(s): "AMMONIA" in the last 168 hours. Coagulation Profile: No results for input(s): "INR", "PROTIME" in the last 168 hours. Cardiac Enzymes: No results for input(s): "CKTOTAL", "CKMB", "CKMBINDEX",  "TROPONINI" in the last 168 hours. BNP (last 3 results) No results for input(s): "PROBNP" in the last 8760 hours. HbA1C: No results for input(s): "HGBA1C" in the last 72 hours. CBG: Recent Labs  Lab 05/10/23 1517 05/10/23 2335 05/11/23 0844 05/11/23 1159  GLUCAP 221* 331* 333* 332*   Lipid Profile: No results for input(s): "CHOL", "HDL", "LDLCALC", "TRIG", "CHOLHDL", "LDLDIRECT" in the last 72 hours. Thyroid Function Tests: No results for input(s): "TSH", "T4TOTAL", "FREET4", "T3FREE", "THYROIDAB" in the last 72 hours. Anemia Panel: No results for input(s): "VITAMINB12", "FOLATE", "FERRITIN", "TIBC", "IRON", "RETICCTPCT" in the last 72 hours. Most Recent Urinalysis On File:     Component Value Date/Time   COLORURINE YELLOW (A) 05/08/2021 1558   APPEARANCEUR HAZY (A) 05/08/2021 1558   LABSPEC 1.017 05/08/2021 1558   PHURINE 5.0 05/08/2021 1558   GLUCOSEU 50 (A) 05/08/2021 1558   HGBUR NEGATIVE 05/08/2021 1558   BILIRUBINUR NEGATIVE 05/08/2021 1558   KETONESUR 5 (A) 05/08/2021 1558   PROTEINUR 30 (A) 05/08/2021 1558   UROBILINOGEN 0.2 05/07/2011 1813   NITRITE NEGATIVE 05/08/2021 1558   LEUKOCYTESUR NEGATIVE 05/08/2021 1558   Sepsis Labs: @LABRCNTIP (procalcitonin:4,lacticidven:4) Microbiology: Recent Results (from the past 240 hours)  Resp panel by RT-PCR (RSV, Flu A&B, Covid) Anterior Nasal Swab     Status: None   Collection Time: 05/10/23  8:26 PM   Specimen: Anterior Nasal Swab  Result Value Ref Range Status   SARS Coronavirus 2 by RT PCR NEGATIVE NEGATIVE Final    Comment: (NOTE) SARS-CoV-2 target nucleic acids are NOT DETECTED.  The SARS-CoV-2 RNA is generally detectable in upper respiratory specimens during the acute phase of infection. The lowest concentration of SARS-CoV-2 viral copies this assay can detect is 138 copies/mL. A negative result does not preclude SARS-Cov-2 infection and should not be used as the sole basis for treatment or other patient  management decisions. A negative result may occur with  improper specimen collection/handling, submission of specimen other than nasopharyngeal swab, presence of viral  mutation(s) within the areas targeted by this assay, and inadequate number of viral copies(<138 copies/mL). A negative result must be combined with clinical observations, patient history, and epidemiological information. The expected result is Negative.  Fact Sheet for Patients:  BloggerCourse.com  Fact Sheet for Healthcare Providers:  SeriousBroker.it  This test is no t yet approved or cleared by the [place] FDA and  has been authorized for detection and/or diagnosis of SARS-CoV-2 by FDA under an Emergency Use Authorization (EUA). This EUA will remain  in effect (meaning this test can be used) for the duration of the COVID-19 declaration under Section 564(b)(1) of the Act, 21 U.S.C.section 360bbb-3(b)(1), unless the authorization is terminated  or revoked sooner.       Influenza A by PCR NEGATIVE NEGATIVE Final   Influenza B by PCR NEGATIVE NEGATIVE Final    Comment: (NOTE) The Xpert Xpress SARS-CoV-2/FLU/RSV plus assay is intended as an aid in the diagnosis of influenza from Nasopharyngeal swab specimens and should not be used as a sole basis for treatment. Nasal washings and aspirates are unacceptable for Xpert Xpress SARS-CoV-2/FLU/RSV testing.  Fact Sheet for Patients: BloggerCourse.com  Fact Sheet for Healthcare Providers: SeriousBroker.it  This test is not yet approved or cleared by the [place] FDA and has been authorized for detection and/or diagnosis of SARS-CoV-2 by FDA under an Emergency Use Authorization (EUA). This EUA will remain in effect (meaning this test can be used) for the duration of the COVID-19 declaration under Section 564(b)(1) of the Act, 21 U.S.C. section 360bbb-3(b)(1),  unless the authorization is terminated or revoked.     Resp Syncytial Virus by PCR NEGATIVE NEGATIVE Final    Comment: (NOTE) Fact Sheet for Patients: BloggerCourse.com  Fact Sheet for Healthcare Providers: SeriousBroker.it  This test is not yet approved or cleared by the [place] FDA and has been authorized for detection and/or diagnosis of SARS-CoV-2 by FDA under an Emergency Use Authorization (EUA). This EUA will remain in effect (meaning this test can be used) for the duration of the COVID-19 declaration under Section 564(b)(1) of the Act, 21 U.S.C. section 360bbb-3(b)(1), unless the authorization is terminated or revoked.  Performed at [facility], [address], [place], [place] [address]       Radiology Studies last 3 days: DG Knee 1-2 Views Left Result Date: 05/11/2023 CLINICAL DATA:  78 year old male with history of left-sided knee pain. EXAM: LEFT KNEE - 1-2 VIEW COMPARISON:  No priors. FINDINGS: Two views of the left knee demonstrate a small suprapatellar effusion. No acute displaced fracture. There is joint space narrowing, subchondral sclerosis, subchondral cyst formation and osteophyte formation noted in a tricompartmental distribution, most severe in the medial and patellofemoral compartments, indicative of osteoarthritis. IMPRESSION: 1. Suprapatellar joint effusion. 2. No evidence of acute displaced fracture. 3. 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 Electronically Signed   By: Trudie Reed M.D.   On: 05/11/2023 06:15   DG Chest Port 1 View Result Date: 05/10/2023 CLINICAL DATA:  Fever.  Forefoot cellulitis. EXAM: PORTABLE CHEST 1 VIEW COMPARISON:  05/08/2021 FINDINGS: Heart size is normal. Mediastinal shadows are normal. No evidence of consolidation or lobar collapse. No visible effusion. IMPRESSION: No active disease. Electronically Signed   By:  Paulina Fusi M.D.   On: 05/10/2023 21:09   DG Foot 2 Views Left Result Date: 05/10/2023 CLINICAL DATA:  Left lower extremity cellulitis. EXAM: LEFT FOOT - 2 VIEW COMPARISON:  MRI 05/09/2021 FINDINGS: Previous forefoot amputation. Some air/gas  apparently in the soft tissues along the anterior margin. No radiographic sign of osteomyelitis or lytic destruction. No periosteal elevation. IMPRESSION: Previous forefoot amputation. Some air/gas apparently in the soft tissues along the anterior margin. No radiographic sign of osteomyelitis. Electronically Signed   By: Paulina Fusi M.D.   On: 05/10/2023 21:07          Sunnie Nielsen, DO [facility] 05/11/2023, 2:08 PM    Dictation software may have been used to generate the above note. Typos may occur and escape review in typed/dictated notes. Please contact Dr Lyn Hollingshead directly for clarity if needed.  Staff may message me via secure chat in Epic  but this may not receive an immediate response,  please page me for urgent matters!  If 7PM-7AM, please contact night coverage [email or web address]

## 2023-05-12 ENCOUNTER — Encounter: Payer: Self-pay | Admitting: Orthopedic Surgery

## 2023-05-12 DIAGNOSIS — E1140 Type 2 diabetes mellitus with diabetic neuropathy, unspecified: Secondary | ICD-10-CM | POA: Diagnosis present

## 2023-05-12 DIAGNOSIS — L03116 Cellulitis of left lower limb: Secondary | ICD-10-CM

## 2023-05-12 DIAGNOSIS — N1831 Chronic kidney disease, stage 3a: Secondary | ICD-10-CM | POA: Diagnosis present

## 2023-05-12 DIAGNOSIS — F17210 Nicotine dependence, cigarettes, uncomplicated: Secondary | ICD-10-CM | POA: Diagnosis present

## 2023-05-12 DIAGNOSIS — E11621 Type 2 diabetes mellitus with foot ulcer: Secondary | ICD-10-CM | POA: Diagnosis present

## 2023-05-12 DIAGNOSIS — D62 Acute posthemorrhagic anemia: Secondary | ICD-10-CM | POA: Diagnosis not present

## 2023-05-12 DIAGNOSIS — I130 Hypertensive heart and chronic kidney disease with heart failure and stage 1 through stage 4 chronic kidney disease, or unspecified chronic kidney disease: Secondary | ICD-10-CM | POA: Diagnosis present

## 2023-05-12 DIAGNOSIS — L089 Local infection of the skin and subcutaneous tissue, unspecified: Secondary | ICD-10-CM | POA: Diagnosis not present

## 2023-05-12 DIAGNOSIS — E1165 Type 2 diabetes mellitus with hyperglycemia: Secondary | ICD-10-CM | POA: Diagnosis present

## 2023-05-12 DIAGNOSIS — Z9981 Dependence on supplemental oxygen: Secondary | ICD-10-CM | POA: Diagnosis not present

## 2023-05-12 DIAGNOSIS — L97522 Non-pressure chronic ulcer of other part of left foot with fat layer exposed: Secondary | ICD-10-CM | POA: Diagnosis present

## 2023-05-12 DIAGNOSIS — I5032 Chronic diastolic (congestive) heart failure: Secondary | ICD-10-CM | POA: Diagnosis present

## 2023-05-12 DIAGNOSIS — F32A Depression, unspecified: Secondary | ICD-10-CM | POA: Diagnosis present

## 2023-05-12 DIAGNOSIS — E860 Dehydration: Secondary | ICD-10-CM | POA: Diagnosis present

## 2023-05-12 DIAGNOSIS — M869 Osteomyelitis, unspecified: Secondary | ICD-10-CM | POA: Diagnosis present

## 2023-05-12 DIAGNOSIS — M00862 Arthritis due to other bacteria, left knee: Secondary | ICD-10-CM | POA: Diagnosis not present

## 2023-05-12 DIAGNOSIS — E1122 Type 2 diabetes mellitus with diabetic chronic kidney disease: Secondary | ICD-10-CM | POA: Diagnosis present

## 2023-05-12 DIAGNOSIS — E1151 Type 2 diabetes mellitus with diabetic peripheral angiopathy without gangrene: Secondary | ICD-10-CM | POA: Diagnosis present

## 2023-05-12 DIAGNOSIS — M1712 Unilateral primary osteoarthritis, left knee: Secondary | ICD-10-CM | POA: Diagnosis present

## 2023-05-12 DIAGNOSIS — E785 Hyperlipidemia, unspecified: Secondary | ICD-10-CM | POA: Diagnosis present

## 2023-05-12 DIAGNOSIS — Z794 Long term (current) use of insulin: Secondary | ICD-10-CM | POA: Diagnosis not present

## 2023-05-12 DIAGNOSIS — Z89511 Acquired absence of right leg below knee: Secondary | ICD-10-CM | POA: Diagnosis not present

## 2023-05-12 DIAGNOSIS — Z1152 Encounter for screening for COVID-19: Secondary | ICD-10-CM | POA: Diagnosis not present

## 2023-05-12 DIAGNOSIS — M009 Pyogenic arthritis, unspecified: Secondary | ICD-10-CM | POA: Diagnosis present

## 2023-05-12 DIAGNOSIS — E663 Overweight: Secondary | ICD-10-CM | POA: Diagnosis present

## 2023-05-12 DIAGNOSIS — M00062 Staphylococcal arthritis, left knee: Secondary | ICD-10-CM | POA: Diagnosis not present

## 2023-05-12 DIAGNOSIS — Z89432 Acquired absence of left foot: Secondary | ICD-10-CM | POA: Diagnosis not present

## 2023-05-12 DIAGNOSIS — J449 Chronic obstructive pulmonary disease, unspecified: Secondary | ICD-10-CM | POA: Diagnosis present

## 2023-05-12 LAB — GLUCOSE, CAPILLARY
Glucose-Capillary: 208 mg/dL — ABNORMAL HIGH (ref 70–99)
Glucose-Capillary: 271 mg/dL — ABNORMAL HIGH (ref 70–99)
Glucose-Capillary: 210 mg/dL — ABNORMAL HIGH (ref 70–99)
Glucose-Capillary: 241 mg/dL — ABNORMAL HIGH (ref 70–99)
Glucose-Capillary: 327 mg/dL — ABNORMAL HIGH (ref 70–99)

## 2023-05-12 LAB — CBC
HCT: 30 % — ABNORMAL LOW (ref 39.0–52.0)
Hemoglobin: 10.1 g/dL — ABNORMAL LOW (ref 13.0–17.0)
MCH: 27.6 pg (ref 26.0–34.0)
MCHC: 33.7 g/dL (ref 30.0–36.0)
MCV: 82 fL (ref 80.0–100.0)
Platelets: 305 10*3/uL (ref 150–400)
RBC: 3.66 MIL/uL — ABNORMAL LOW (ref 4.22–5.81)
RDW: 13.2 % (ref 11.5–15.5)
WBC: 15.6 10*3/uL — ABNORMAL HIGH (ref 4.0–10.5)
nRBC: 0 % (ref 0.0–0.2)

## 2023-05-12 LAB — BASIC METABOLIC PANEL
Anion gap: 10 (ref 5–15)
BUN: 39 mg/dL — ABNORMAL HIGH (ref 8–23)
CO2: 23 mmol/L (ref 22–32)
Calcium: 7.8 mg/dL — ABNORMAL LOW (ref 8.9–10.3)
Chloride: 97 mmol/L — ABNORMAL LOW (ref 98–111)
Creatinine, Ser: 1.21 mg/dL (ref 0.61–1.24)
GFR, Estimated: >60 mL/min (ref >60)
Glucose, Bld: 228 mg/dL — ABNORMAL HIGH (ref 70–99)
Potassium: 4.3 mmol/L (ref 3.5–5.1)
Sodium: 130 mmol/L — ABNORMAL LOW (ref 135–145)

## 2023-05-12 MED ORDER — CLOPIDOGREL BISULFATE 75 MG PO TABS
75.0000 mg | ORAL_TABLET | Freq: Every day | ORAL | Status: DC
  Administered 2023-05-12 – 2023-05-17 (×6): 75 mg via ORAL
  Filled 2023-05-12 (×6): qty 1

## 2023-05-12 MED ORDER — ENSURE ENLIVE PO LIQD
237.0000 mL | Freq: Two times a day (BID) | ORAL | Status: DC
  Administered 2023-05-12 – 2023-05-17 (×7): 237 mL via ORAL

## 2023-05-12 NOTE — Plan of Care (Signed)
  Problem: Metabolic: Goal: Ability to maintain appropriate glucose levels will improve Outcome: Progressing   Problem: Nutritional: Goal: Maintenance of adequate nutrition will improve Outcome: Progressing   Problem: Skin Integrity: Goal: Risk for impaired skin integrity will decrease Outcome: Progressing   Problem: Tissue Perfusion: Goal: Adequacy of tissue perfusion will improve Outcome: Progressing   Problem: Pain Managment: Goal: General experience of comfort will improve and/or be controlled Outcome: Progressing   Problem: Safety: Goal: Ability to remain free from injury will improve Outcome: Progressing

## 2023-05-12 NOTE — Evaluation (Signed)
Physical Therapy Evaluation Patient Details Name: Miguel Harvey MRN: [id number] DOB: 02-24-1946 Today's Date: 05/12/2023  History of Present Illness  Pt is a 78 y.o. male with medical history significant of HTN, HLD, DM, dCHF, COPD on 2L O2, CAD, depression, CKD-3A, PEA, NSVT, chronic left foot osteomyelitis, s/p of right BKA, tobacco abuse, who presents with left knee pain.  MD assessment includes: hyponatremia and L knee pain with septic arthritis with pt now s/p arthroscopic irrigation debridement and partial synovectomy.   Clinical Impression  Pt hesitant to participate during the session but with encouragement and education on physiological benefits of activity the pt willing to participate.  Pt required extensive +2 assist with all bed mobility tasks and once in sitting at the EOB was able to maintain static sitting position well with no assist but declined to attempt to transfer to the recliner secondary to L knee pain.  Pt will benefit from continued PT services upon discharge to safely address deficits listed in patient problem list for decreased caregiver assistance and eventual return to PLOF.       If plan is discharge home, recommend the following: Two people to help with walking and/or transfers;A lot of help with bathing/dressing/bathroom;Direct supervision/assist for medications management;Assist for transportation   Can travel by private vehicle   No    Equipment Recommendations None recommended by PT  Recommendations for Other Services       Functional Status Assessment Patient has had a recent decline in their functional status and demonstrates the ability to make significant improvements in function in a reasonable and predictable amount of time.     Precautions / Restrictions Precautions Precautions: Fall Restrictions Weight Bearing Restrictions Per Provider Order: Yes LLE Weight Bearing Per Provider Order: Weight bearing as tolerated Other Position/Activity  Restrictions: Per Dr. Audelia Acton, avoid L knee AROM/PROM exercises while drain is in but ok for pt to sit at EOB and get to the chair      Mobility  Bed Mobility Overal bed mobility: Needs Assistance Bed Mobility: Supine to Sit, Sit to Supine     Supine to sit: Total assist, +2 for physical assistance Sit to supine: +2 for physical assistance, Total assist   General bed mobility comments: Pt attempted to assist with bed mobility tasks but required extensive +2 assist despite his effort    Transfers                   General transfer comment: Pt declined to attempt secondary to L knee pain    Ambulation/Gait                  Stairs            Wheelchair Mobility     Tilt Bed    Modified Rankin (Stroke Patients Only)       Balance Overall balance assessment: Needs assistance Sitting-balance support: Bilateral upper extremity supported Sitting balance-Leahy Scale: Good                                       Pertinent Vitals/Pain Pain Assessment Pain Assessment: 0-10 Pain Score: 10-Worst pain ever Pain Location: L knee Pain Descriptors / Indicators: Sore, Aching Pain Intervention(s): Repositioned, Premedicated before session, Monitored during session, Limited activity within patient's tolerance    Home Living Family/patient expects to be discharged to:: Skilled nursing facility  Additional Comments: [facility] at [facility]    Prior Function Prior Level of Function : Independent/Modified Independent             Mobility Comments: Ind bed mobility and SPTs without AD, uses w/c for facility mobility, has a R prosthetic leg but hasn't used it since "Around last July because I'm lazy", no fall history ADLs Comments: Ind with ADLs     Extremity/Trunk Assessment   Upper Extremity Assessment Upper Extremity Assessment: Overall WFL for tasks assessed    Lower Extremity Assessment Lower Extremity Assessment:  LLE deficits/detail LLE: Unable to fully assess due to pain       Communication   Communication Communication: No apparent difficulties    Cognition Arousal: Alert Behavior During Therapy: WFL for tasks assessed/performed   PT - Cognitive impairments: No apparent impairments                         Following commands: Intact       Cueing Cueing Techniques: Verbal cues, Tactile cues     General Comments      Exercises Total Joint Exercises Quad Sets: Strengthening, Right, 10 reps Long Arc Quad: AROM, Strengthening, Right, 10 reps Knee Flexion: AROM, Strengthening, Right, 10 reps Other Exercises Other Exercises: Static sitting at EOB x 5 min for core activation and improved activity tolerance   Assessment/Plan    PT Assessment Patient needs continued PT services  PT Problem List Decreased strength;Decreased range of motion;Decreased activity tolerance;Decreased balance;Decreased mobility;Decreased knowledge of use of DME;Pain       PT Treatment Interventions DME instruction;Functional mobility training;Therapeutic activities;Therapeutic exercise;Balance training;Patient/family education    PT Goals (Current goals can be found in the Care Plan section)  Acute Rehab PT Goals Patient Stated Goal: Decreased pain in the L knee PT Goal Formulation: With patient Time For Goal Achievement: 05/25/23 Potential to Achieve Goals: Good    Frequency Min 1X/week     Co-evaluation               AM-PAC PT "6 Clicks" Mobility  Outcome Measure Help needed turning from your back to your side while in a flat bed without using bedrails?: Total Help needed moving from lying on your back to sitting on the side of a flat bed without using bedrails?: Total Help needed moving to and from a bed to a chair (including a wheelchair)?: Total Help needed standing up from a chair using your arms (e.g., wheelchair or bedside chair)?: Total Help needed to walk in hospital  room?: Total Help needed climbing 3-5 steps with a railing? : Total 6 Click Score: 6    End of Session   Activity Tolerance: Patient limited by pain Patient left: in bed;with call bell/phone within reach;with bed alarm set Nurse Communication: Mobility status PT Visit Diagnosis: Muscle weakness (generalized) (M62.81);Pain Pain - Right/Left: Left Pain - part of body: Knee    Time: 0935-1009 PT Time Calculation (min) (ACUTE ONLY): 34 min   Charges:   PT Evaluation $PT Eval Moderate Complexity: 1 Mod PT Treatments $Therapeutic Activity: 8-22 mins PT General Charges $$ ACUTE PT VISIT: 1 Visit       D. Scott Laynie Espy PT, DPT 05/12/23, 11:07 AM

## 2023-05-12 NOTE — Care Management Obs Status (Signed)
MEDICARE OBSERVATION STATUS NOTIFICATION   Patient Details  Name: Miguel Harvey MRN: [id number] Date of Birth: 27-Aug-1945   Medicare Observation Status Notification Given:  Orland Dec, CMA 05/12/2023, 9:54 AM

## 2023-05-12 NOTE — Consult Note (Signed)
NAME: Miguel Harvey  DOB: 10/20/1945  MRN: [id number]  Date/Time: 05/12/2023 10:27 AM  REQUESTING PROVIDER : DR. ABERMAN  REASON FOR CONSULT left knee septic arthritis:   Miguel Harvey is a 78 y.o. 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  There was no fever or chills.  He did not have any cough or shortness of breath or trauma.  He has a chronic left ulcer with drainage. In the ED vitals were BP of 116/78, temperature 97.8, pulse 87, respiratory rate 19 and sats of 100%. Labs revealed a WBC of 13.7, Hb 9.6, platelet 300, creatinine 1.58.  Blood culture was sent. The left knee was aspirated and it showed 193,603 WBCs with 99% neutrophils. Gram stain showed gram-positive cocci. Patient underwent arthroscopic irrigation and debridement of the left knee with synovectomy on 05/12/2023..  Cultures were sent.  I am asked to see the patient for the infection of the knee. Patient is currently on vancomycin and ceftriaxone.   Past Medical History:  Diagnosis Date   Abnormal posture    CHF (congestive heart failure) (HCC)    Chronic kidney disease    CKD   stage 2   Complication of anesthesia    heart stopped x2 during toe amputation   COPD (chronic obstructive pulmonary disease) (HCC)    Coronary artery disease, non-occlusive    a. LHC 05/2011: 10-20% ostial LAD stenosis   Depression    Diabetes mellitus    Grade I diastolic dysfunction    HLD (hyperlipidemia)    Hypertension    Muscle weakness    Neuromuscular disorder (HCC)    neuropathy   NICM (nonischemic cardiomyopathy) (HCC)    a. TTE 2/213: EF 50-55%, mild LVH, normal wall motion, Gr2DD, mildly dilated LA; b. TTE 2/13:  EF 40-45%, mild concentric LVH, global HK, no evidence of LV thrombus; c. TTE 4/19: EF 40%, diffuse HK, Gr2DD, mild MR, mildly dilated LA, RV cavity size normal w/ normal RVSF, PASP 63; d. TTE 4/19: EF 25-30%, diffuse HK, mild LVH, mild MR, mild PASP; e. TTE 5/19: EF 20-25%, diffuse HK, moderate MR,   NSVT (nonsustained ventricular tachycardia) (HCC)    PAD (peripheral artery disease) (HCC)    a. s/p right BKA s/p  recent balloon angioplasty fo the left peroneal artery, left posterior tibial artery, tibioperoneal trunk, and stenting to the distal left SFA in 07/2017   PEA (Pulseless electrical activity) (HCC)    heart stopped beating during surgery for amputation of toes    Past Surgical History:  Procedure Laterality Date   AMPUTATION Right 07/02/2017   Procedure: RIGHT BELOW KNEE AMPUTATION;  Surgeon: Nadara Mustard, MD;  Location: [facility] OR;  Service: Orthopedics;  Laterality: Right;   AMPUTATION TOE Left 05/11/2021   Procedure: LEFT GREAT  TOE AMPUTATION;  Surgeon: Gwyneth Revels, DPM;  Location: [facility] ORS;  Service: Podiatry;  Laterality: Left;   CARDIAC CATHETERIZATION     CATARACT EXTRACTION W/PHACO Left 11/08/2018   Procedure: CATARACT EXTRACTION PHACO AND INTRAOCULAR LENS PLACEMENT (IOC)  LEFT VISION BLUE;  Surgeon: Elliot Cousin, MD;  Location: [facility] ORS;  Service: Ophthalmology;  Laterality: Left;  [place] 03:38.9 CDE 42.37 Fluid pack lot # [id number]  CATARACT EXTRACTION W/PHACO Right 03/25/2023   Procedure: CATARACT EXTRACTION PHACO AND INTRAOCULAR LENS PLACEMENT (IOC) RIGHT DIABETIC MALYUGIN OMIDRIA 24.82 02:08.6;  Surgeon: Estanislado Pandy, MD;  Location: [facility];  Service: Ophthalmology;  Laterality: Right;   cyst removal from  right leg about 6 years ago     IRRIGATION AND DEBRIDEMENT FOOT Left 08/10/2017   Procedure: IRRIGATION AND DEBRIDEMENT FOOT;  Surgeon: Gwyneth Revels, DPM;  Location: [facility] ORS;  Service: Podiatry;  Laterality: Left;   LEFT HEART CATHETERIZATION WITH  CORONARY ANGIOGRAM N/A 05/11/2011   Procedure: LEFT HEART CATHETERIZATION WITH CORONARY ANGIOGRAM;  Surgeon: Lennette Bihari, MD;  Location: [facility] C[facility];  Service: Cardiovascular;  Laterality: N/A;   LOWER EXTREMITY ANGIOGRAPHY Left 08/13/2017   Procedure: Lower Extremity Angiography and PICC line placement;  Surgeon: Annice Needy, MD;  Location: [facility] INVASIVE CV LAB;  Service: Cardiovascular;  Laterality: Left;   ruptured navel     repair of   TOE AMPUTATION     not sure of when this surgery occured, but his heart stopped beating x 2 during surgery    Social History   Socioeconomic History   Marital status: Single    Spouse name: danielle .... daughter   Number of children: Not on file   Years of education: Not on file   Highest education level: Not on file  Occupational History   Not on file  Tobacco Use   Smoking status: Every Day    Current packs/day: 0.00    Types: Cigarettes   Smokeless tobacco: Former    Quit date: 05/07/2011   Tobacco comments:    3 cigs per day (previous hx of smoking 1 PPD)  Vaping Use   Vaping status: Never Used  Substance and Sexual Activity   Alcohol use: Not Currently    Alcohol/week: 6.0 standard drinks of alcohol    Types: 6 Cans of beer per week   Drug use: Never   Sexual activity: Not on file  Other Topics Concern   Not on file  Social History Narrative   Not on file   Social Drivers of Health   Financial Resource Strain: Not on file  Food Insecurity: No Food Insecurity (05/11/2023)   Hunger Vital Sign    Worried About Running Out of Food in the Last Year: Never true    Ran Out of Food in the Last Year: Never true  Transportation Needs: No Transportation Needs (05/11/2023)   PRAPARE - Administrator, Civil Service (Medical): No    Lack of Transportation (Non-Medical): No  Physical Activity: Inactive (08/05/2017)   Exercise Vital Sign    Days of Exercise per Week: 0 days    Minutes of Exercise per Session: 0 min  Stress: No  Stress Concern Present (08/05/2017)   [employer] of Occupational Health - Occupational Stress Questionnaire    Feeling of Stress : Not at all  Social Connections: Socially Isolated (05/11/2023)   Social Connection and Isolation Panel [NHANES]    Frequency of Communication with Friends and Family: Patient declined    Frequency of Social Gatherings with Friends and Family: Three times a week    Attends Religious Services: Never    Active Member of Clubs or Organizations: No    Attends Banker Meetings: Never    Marital Status: Never married  Intimate Partner Violence: Not At Risk (05/11/2023)   Humiliation, Afraid, Rape, and Kick questionnaire    Fear of Current or Ex-Partner: No  Emotionally Abused: No    Physically Abused: No    Sexually Abused: No    Family History  Problem Relation Age of Onset   CAD Other    Diabetes Other    Allergies  Allergen Reactions   Spironolactone     Hyperkalemia (currently takes spironolactone)   I? Current Facility-Administered Medications  Medication Dose Route Frequency Provider Last Rate Last Admin   acetaminophen (TYLENOL) tablet 500 mg  500 mg Oral Q6H Reinaldo Berber, MD   500 mg at 05/11/23 1844   acetaminophen (TYLENOL) tablet 650 mg  650 mg Oral Q6H PRN Lorretta Harp, MD       albuterol (PROVENTIL) (2.5 MG/3ML) 0.083% nebulizer solution 2.5 mg  2.5 mg Nebulization Q4H PRN Lorretta Harp, MD       aspirin EC tablet 81 mg  81 mg Oral Daily Lorretta Harp, MD   81 mg at 05/12/23 0900   atorvastatin (LIPITOR) tablet 20 mg  20 mg Oral QPM Lorretta Harp, MD   20 mg at 05/11/23 1750   carvedilol (COREG) tablet 6.25 mg  6.25 mg Oral BID WC Lorretta Harp, MD   6.25 mg at 05/12/23 0859   cefTRIAXone (ROCEPHIN) 2 g in sodium chloride 0.9 % 100 mL IVPB  2 g Intravenous Q24H Lorretta Harp, MD   Stopped at 05/11/23 2312   dextromethorphan-guaiFENesin (MUCINEX DM) 30-600 MG per 12 hr tablet 1 tablet  1 tablet Oral BID PRN Lorretta Harp, MD       docusate  sodium (COLACE) capsule 100 mg  100 mg Oral BID Reinaldo Berber, MD   100 mg at 05/12/23 0859   feeding supplement (ENSURE ENLIVE / ENSURE PLUS) liquid 237 mL  237 mL Oral BID BM Rosezetta Schlatter T, MD   237 mL at 05/12/23 0900   furosemide (LASIX) tablet 20 mg  20 mg Oral Daily Sunnie Nielsen, DO   20 mg at 05/12/23 0900   heparin injection 5,000 Units  5,000 Units Subcutaneous Q8H Lorretta Harp, MD   5,000 Units at 05/12/23 0524   hydrALAZINE (APRESOLINE) injection 5 mg  5 mg Intravenous Q2H PRN Lorretta Harp, MD       HYDROcodone-acetaminophen (NORCO) 7.5-325 MG per tablet 1 tablet  1 tablet Oral Q4H PRN Reinaldo Berber, MD   1 tablet at 05/12/23 0125   insulin aspart (novoLOG) injection 0-5 Units  0-5 Units Subcutaneous QHS Lorretta Harp, MD   4 Units at 05/10/23 2345   insulin aspart (novoLOG) injection 0-9 Units  0-9 Units Subcutaneous TID WC Lorretta Harp, MD   5 Units at 05/12/23 0902   insulin aspart (novoLOG) injection 5 Units  5 Units Subcutaneous TID WC Sunnie Nielsen, DO   5 Units at 05/12/23 0901   insulin glargine-yfgn (SEMGLEE) injection 10 Units  10 Units Subcutaneous Daily Sunnie Nielsen, DO   10 Units at 05/12/23 1018   latanoprost (XALATAN) 0.005 % ophthalmic solution 1 drop  1 drop Both Eyes QHS Lorretta Harp, MD   1 drop at 05/11/23 2112   menthol-cetylpyridinium (CEPACOL) lozenge 3 mg  1 lozenge Oral PRN Reinaldo Berber, MD       Or   phenol (CHLORASEPTIC) mouth spray 1 spray  1 spray Mouth/Throat PRN Reinaldo Berber, MD       metoCLOPramide (REGLAN) tablet 5-10 mg  5-10 mg Oral Q8H PRN Reinaldo Berber, MD       Or   metoCLOPramide (REGLAN) injection 5-10 mg  5-10 mg Intravenous Q8H PRN Reinaldo Berber, MD  morphine (PF) 2 MG/ML injection 0.5-1 mg  0.5-1 mg Intravenous Q2H PRN Reinaldo Berber, MD   1 mg at 05/12/23 1610   multivitamin with minerals tablet 1 tablet  1 tablet Oral Daily Lorretta Harp, MD   1 tablet at 05/12/23 9604   mupirocin cream (BACTROBAN) 2 %   Topical  Daily Sunnie Nielsen, DO   Given at 05/11/23 1027   nicotine (NICODERM CQ - dosed in mg/24 hours) patch 21 mg  21 mg Transdermal Daily Lorretta Harp, MD       ondansetron [facility]) injection 4 mg  4 mg Intravenous Q8H PRN Lorretta Harp, MD       oxyCODONE-acetaminophen (PERCOCET/ROXICET) 5-325 MG per tablet 1 tablet  1 tablet Oral Q4H PRN Lorretta Harp, MD   1 tablet at 05/12/23 0900   polyvinyl alcohol (LIQUIFILM TEARS) 1.4 % ophthalmic solution 1 drop  1 drop Both Eyes QID Lorretta Harp, MD   1 drop at 05/12/23 5409   sertraline (ZOLOFT) tablet 50 mg  50 mg Oral Daily Lorretta Harp, MD   50 mg at 05/12/23 0900   sodium chloride tablet 1 g  1 g Oral BID WC Lorretta Harp, MD   1 g at 05/12/23 0859   traZODone (DESYREL) tablet 50 mg  50 mg Oral QHS PRN Lorretta Harp, MD       vancomycin (VANCOREADY) IVPB 1500 mg/300 mL  1,500 mg Intravenous Q24H Merryl Hacker, [place]   Stopped at 05/12/23 0327   vitamin B-12 (CYANOCOBALAMIN) tablet 100 mcg  100 mcg Oral Daily Lorretta Harp, MD   100 mcg at 05/12/23 0900     Abtx:  Anti-infectives (From admission, onward)    Start     Dose/Rate Route Frequency Ordered Stop   05/12/23 0200  vancomycin (VANCOREADY) IVPB 1250 mg/250 mL  Status:  Discontinued        1,250 mg 166.7 mL/hr over 90 Minutes Intravenous Every 24 hours 05/11/23 0113 05/11/23 0840   05/12/23 0200  vancomycin (VANCOREADY) IVPB 1500 mg/300 mL        1,500 mg 150 mL/hr over 120 Minutes Intravenous Every 24 hours 05/11/23 0840     05/11/23 1608  lactated ringers 3,000 mL with gentamicin (GARAMYCIN) 80 mg  Status:  Discontinued          As needed 05/11/23 1609 05/11/23 1638   05/11/23 0045  vancomycin (VANCOCIN) IVPB 1000 mg/200 mL premix        1,000 mg 200 mL/hr over 60 Minutes Intravenous  Once 05/10/23 2350 05/11/23 0209   05/10/23 2245  cefTRIAXone (ROCEPHIN) 2 g in sodium chloride 0.9 % 100 mL IVPB        2 g 200 mL/hr over 30 Minutes Intravenous Every 24 hours 05/10/23 2239     05/10/23 2200  vancomycin  (VANCOCIN) IVPB 1000 mg/200 mL premix        1,000 mg 200 mL/hr over 60 Minutes Intravenous  Once 05/10/23 2152 05/10/23 2341       REVIEW OF SYSTEMS:  Const: negative fever, negative chills, negative weight loss Eyes: negative diplopia or visual changes, negative eye pain ENT: negative coryza, negative sore throat Resp: negative cough, hemoptysis, dyspnea Cards: negative for chest pain, palpitations, lower extremity edema GU: negative for frequency, dysuria and hematuria GI: Negative for abdominal pain, diarrhea, bleeding, constipation Skin: negative for rash and pruritus Heme: negative for easy bruising and gum/nose bleeding MS: pain left knee with swelling Neurolo:negative for headaches, dizziness, vertigo, memory problems  Psych:  negative for feelings of anxiety, depression  Endocrine: negative for thyroid, diabetes Allergy/Immunology-as above ?  Objective:  VITALS:  BP 125/86 (BP Location: Right Arm)   Pulse 72   Temp 97.6 F (36.4 C)   Resp 16   Ht 6' (1.829 m)   Wt 95.1 kg   SpO2 95%   BMI 28.43 kg/m  LDA Foley Central line Other drainage tubes PHYSICAL EXAM:  General: Alert, cooperative, no distress, appears stated age.  Head: Normocephalic, without obvious abnormality, atraumatic. Eyes: Conjunctivae clear, anicteric sclerae. Pupils are equal ENT Nares normal. No drainage or sinus tenderness. Lips, mucosa, and tongue normal. No Thrush Neck: Supple, symmetrical, no adenopathy, thyroid: non tender no carotid bruit and no JVD. Back: No CVA tenderness. Lungs: Clear to auscultation bilaterally. No Wheezing or Rhonchi. No rales. Heart: Regular rate and rhythm, no murmur, rub or gallop. Abdomen: Soft, non-tender,not distended. Bowel sounds normal. No masses Extremities: atraumatic, no cyanosis. No edema. No clubbing Skin: No rashes or lesions. Or bruising Lymph: Cervical, supraclavicular normal. Neurologic: Grossly non-focal Pertinent Labs Lab Results CBC     Component Value Date/Time   WBC 15.6 (H) 05/12/2023 0533   RBC 3.66 (L) 05/12/2023 0533   HGB 10.1 (L) 05/12/2023 0533   HGB 11.6 (L) 11/07/2019 1439   HCT 30.0 (L) 05/12/2023 0533   HCT 35.7 (L) 11/07/2019 1439   PLT 305 05/12/2023 0533   PLT 288 11/07/2019 1439   MCV 82.0 05/12/2023 0533   MCV 89 11/07/2019 1439   MCH 27.6 05/12/2023 0533   MCHC 33.7 05/12/2023 0533   RDW 13.2 05/12/2023 0533   RDW 13.6 11/07/2019 1439   LYMPHSABS 0.9 05/08/2021 1230   LYMPHSABS 1.9 10/14/2017 0946   MONOABS 0.6 05/08/2021 1230   EOSABS 0.0 05/08/2021 1230   EOSABS 0.3 10/14/2017 0946   BASOSABS 0.0 05/08/2021 1230   BASOSABS 0.0 10/14/2017 0946       Latest Ref Rng & Units 05/12/2023    5:33 AM 05/11/2023    7:16 AM 05/10/2023    3:02 PM  CMP  Glucose 70 - 99 mg/dL 098  119  147   BUN 8 - 23 mg/dL 39  42  44   Creatinine 0.61 - 1.24 mg/dL 8.29  5.62  1.30   Sodium 135 - 145 mmol/L 130  122  124   Potassium 3.5 - 5.1 mmol/L 4.3  4.3  4.1   Chloride 98 - 111 mmol/L 97  89  92   CO2 22 - 32 mmol/L 23  21  21    Calcium 8.9 - 10.3 mg/dL 7.8  7.6  7.6       Microbiology: Recent Results (from the past 240 hours)  Resp panel by RT-PCR (RSV, Flu A&B, Covid) Anterior Nasal Swab     Status: None   Collection Time: 05/10/23  8:26 PM   Specimen: Anterior Nasal Swab  Result Value Ref Range Status   SARS Coronavirus 2 by RT PCR NEGATIVE NEGATIVE Final    Comment: (NOTE) SARS-CoV-2 target nucleic acids are NOT DETECTED.  The SARS-CoV-2 RNA is generally detectable in upper respiratory specimens during the acute phase of infection. The lowest concentration of SARS-CoV-2 viral copies this assay can detect is 138 copies/mL. A negative result does not preclude SARS-Cov-2 infection and should not be used as the sole basis for treatment or other patient management decisions. A negative result may occur with  improper specimen collection/handling, submission of specimen other than nasopharyngeal  swab, presence of  viral mutation(s) within the areas targeted by this assay, and inadequate number of viral copies(<138 copies/mL). A negative result must be combined with clinical observations, patient history, and epidemiological information. The expected result is Negative.  Fact Sheet for Patients:  BloggerCourse.com  Fact Sheet for Healthcare Providers:  SeriousBroker.it  This test is no t yet approved or cleared by the [place] FDA and  has been authorized for detection and/or diagnosis of SARS-CoV-2 by FDA under an Emergency Use Authorization (EUA). This EUA will remain  in effect (meaning this test can be used) for the duration of the COVID-19 declaration under Section 564(b)(1) of the Act, 21 U.S.C.section 360bbb-3(b)(1), unless the authorization is terminated  or revoked sooner.       Influenza A by PCR NEGATIVE NEGATIVE Final   Influenza B by PCR NEGATIVE NEGATIVE Final    Comment: (NOTE) The Xpert Xpress SARS-CoV-2/FLU/RSV plus assay is intended as an aid in the diagnosis of influenza from Nasopharyngeal swab specimens and should not be used as a sole basis for treatment. Nasal washings and aspirates are unacceptable for Xpert Xpress SARS-CoV-2/FLU/RSV testing.  Fact Sheet for Patients: BloggerCourse.com  Fact Sheet for Healthcare Providers: SeriousBroker.it  This test is not yet approved or cleared by the [place] FDA and has been authorized for detection and/or diagnosis of SARS-CoV-2 by FDA under an Emergency Use Authorization (EUA). This EUA will remain in effect (meaning this test can be used) for the duration of the COVID-19 declaration under Section 564(b)(1) of the Act, 21 U.S.C. section 360bbb-3(b)(1), unless the authorization is terminated or revoked.     Resp Syncytial Virus by PCR NEGATIVE NEGATIVE Final    Comment: (NOTE) Fact Sheet for  Patients: BloggerCourse.com  Fact Sheet for Healthcare Providers: SeriousBroker.it  This test is not yet approved or cleared by the [place] FDA and has been authorized for detection and/or diagnosis of SARS-CoV-2 by FDA under an Emergency Use Authorization (EUA). This EUA will remain in effect (meaning this test can be used) for the duration of the COVID-19 declaration under Section 564(b)(1) of the Act, 21 U.S.C. section 360bbb-3(b)(1), unless the authorization is terminated or revoked.  Performed at [facility], [address] [place], [place] [address]   Body fluid culture w Gram Stain     Status: None (Preliminary result)   Collection Time: 05/11/23  8:34 AM   Specimen: Body Fluid  Result Value Ref Range Status   Specimen Description   Final    FLUID Performed at [facility], [address] [place], [place] [address]    Special Requests   Final    NONE Performed at [facility], [address] [place], [place] [address]    Gram Stain   Final    ABUNDANT WBC PRESENT, PREDOMINANTLY PMN RARE GRAM POSITIVE COCCI CRITICAL RESULT CALLED TO, READ BACK BY AND VERIFIED WITH: RN BOBACAR D. ON 05/11/23 @ 1521 BY DRT Performed at [facility], [address], [place], [place] [address]    Culture PENDING  Incomplete   Report Status PENDING  Incomplete    IMAGING RESULTS: I have personally reviewed the films ? Impression/Recommendation Left knee septic arthritis s/p I.D on vanco and ceftriaxone- await culture to de-escalate  Left foot TMA and there is a chronic discharging wound.on the lateral aspect which could be the soruce   Need to culture the wound Podiatry consult   Anemia  PAD- rt BKA  CKD improving  DM  CAD  managemet as per primary team ? ? _I have personally spent  --75-minutes involved in face-to-face and non-face-to-face activities for this patient on the day  of the visit. Professional time spent includes the following activities: Preparing to see the patient (review of tests), Obtaining and/or reviewing separately obtained history (admission/discharge record), Performing a medically appropriate examination and/or evaluation , Ordering medications/tests/procedures, referring and communicating with other health care professionals, Documenting clinical information in the EMR, Independently interpreting results (not separately reported), Communicating results to the patient/family/caregiver, Counseling and educating the patient/family/caregiver and Care coordination (not separately reported).    ________________________________________________ Discussed with patient, requesting provider Note:  This document was prepared using Dragon voice recognition software and may include unintentional dictation errors.

## 2023-05-12 NOTE — Progress Notes (Signed)
Progress Note   Patient: Miguel Harvey MVH:[id number] DOB: 04/05/1945 DOA: 05/10/2023     0 DOS: the patient was seen and examined on 05/12/2023   Brief hospital course:   Hospital course / significant events:    HPI: Miguel Harvey is a 78 y.o. male with medical history significant of HTN, HLD, DM, dCHF, COPD on 2L O2, CAD, depression, CKD-3A, PEA, NSVT, chronic left foot osteomyelitis, s/p right BKA, tobacco abuse, who presents to ED via [facility] from [facility] 05/10/23. Complaints left knee pain x3 days, constant, sharp, severe, nonradiating, aggravated by movement, notes swelling with warmth.  No fever or chills. He has chronic left foot ulcer without drainage. Facility has been treating for LLE cellulitis, got 2L NS at facility.    02/10: X-ray of left foot is negative for signs of osteomyelitis. XR knee no fx, (+)suprapatellar effusion, (+)tricompartmental arthritis.  02/11: ortho consulted, concern for septic joint based on aspiration results, to OR     Consultants:  Orthopedic surgery    Procedures/Surgeries: 05/11/23 arthroscopic washout L knee - Dr Audelia Acton      ASSESSMENT & PLAN:   Left knee pain d/t septic arthritis  Status post aspiration and washout by orthopedic surgeon on 05/11/23  Case discussed with orthopedic surgeon as well as infectious disease  Continue as needed Percocet, Tylenol for pain   Foot osteomyelitis, left  this is a chronic issue, the wound looks like scabbed, no active drainage. Patient is on broad antibiotics as above Wound care on board   Hyponatremia + Pseudohyponatremia d/t hyperglycemia - Sodium corrects to 128 (on BMP Na 122, Glc 335) Mental status normal.   Question fluid overload chronic HFpEF Cautious restart lasix pending postop BP urine sodium, urine osmolality, serum osmolality. Fluid restriction Sodium chloride tablet 1 g twice daily   Type II diabetes mellitus with hyperglycemia Recent A1c 9.8, patient is taking  NovoLog, glargine insulin 10 units daily Sliding scale insulin increased today  Glargine insulin 7 units daily increased to 10    COPD (chronic obstructive pulmonary disease)   Stable Bronchodilators and as needed Mucinex   HLD (hyperlipidemia) Lipitor   HTN (hypertension) IV hydralazine as needed Coreg Lasix Hold lisinopril   CAD (coronary artery disease) No chest pain Resumed Plavix Continue aspirin, Lipitor   Chronic diastolic CHF (congestive heart failure)   2D echo on 05/25/2019 showed EF of 50-55% with grade 1 diastolic dysfunction.  No leg edema JVD.  CHF is compensated, BNP elevated  Continue Lasix   Chronic kidney disease, stage 3a Cautious lasix    TOBACCO DEPENDENCE Continue nicotine patch    Depression Continue home medication  overweight based on BMI: Body mass index is 28.43 kg/m.  Counseled on weight loss when medically stable    DVT prophylaxis: heparin    Code Status: FULL CODE   TOC needs: TBD, expect back to his long term care   Barriers to dispo / significant pending items: Awaiting culture results and clearance by orthopedic surgeon and ID  Family Communication: none at this time     Subjective:  Patient admits to improvement in left knee pain Was seen by orthopedic surgeon and underwent aspiration Gram stain growing gram-positive cocci Denies nausea vomiting Admits to loss of appetite  Physical Exam: General: Not in acute distress Heme: No neck lymph node enlargement. Cardiac: S1/S2, RRR, No murmurs, No gallops or rubs. Respiratory: No rales, wheezing, rhonchi or rubs. GI: Soft, nondistended, nontender, no rebound pain, no organomegaly, BS  present. GU: No hematuria Ext: No pitting leg edema bilaterally. 1+DP/PT pulse bilaterally. S/p of right BKA.  Dressing on the left knee is clean and dry Vitals:   05/11/23 1734 05/11/23 2013 05/12/23 0451 05/12/23 0802  BP: 130/78 108/70 127/69 125/86  Pulse: 77 70 66 72  Resp: 17 18 16 16    Temp: 97.8 F (36.6 C) 97.9 F (36.6 C) (!) 97.4 F (36.3 C) 97.6 F (36.4 C)  TempSrc:      SpO2: 97% 100% 99% 95%  Weight:      Height:        Data Reviewed:    Latest Ref Rng & Units 05/12/2023    5:33 AM 05/11/2023    7:16 AM 05/10/2023    3:02 PM  CBC  WBC 4.0 - 10.5 K/uL 15.6  17.8  13.7   Hemoglobin 13.0 - 17.0 g/dL 84.6  96.2  9.6   Hematocrit 39.0 - 52.0 % 30.0  29.4  28.9   Platelets 150 - 400 K/uL 305  315  300        Latest Ref Rng & Units 05/12/2023    5:33 AM 05/11/2023    7:16 AM 05/10/2023    3:02 PM  BMP  Glucose 70 - 99 mg/dL 952  841  324   BUN 8 - 23 mg/dL 39  42  44   Creatinine 0.61 - 1.24 mg/dL 4.01  0.27  2.53   Sodium 135 - 145 mmol/L 130  122  124   Potassium 3.5 - 5.1 mmol/L 4.3  4.3  4.1   Chloride 98 - 111 mmol/L 97  89  92   CO2 22 - 32 mmol/L 23  21  21    Calcium 8.9 - 10.3 mg/dL 7.8  7.6  7.6       Time spent: 53 minutes  Author: Loyce Dys, MD 05/12/2023 3:14 PM  For on call review [email or web address].

## 2023-05-12 NOTE — Progress Notes (Signed)
Subjective: 1 Day Post-Op Procedure(s) (LRB): ARTHROSCOPIC IRRIGATION AND DEBRIDEMENT LEFT KNEE, SYNOVECTOMY (Left) Patient reports pain as mild.   Patient is well, and has had no acute complaints or problems Denies any CP, SOB, ABD pain. We will continue therapy today.   Objective: Vital signs in last 24 hours: Temp:  [97 F (36.1 C)-97.9 F (36.6 C)] 97.6 F (36.4 C) (02/12 0802) Pulse Rate:  [66-78] 72 (02/12 0802) Resp:  [15-27] 16 (02/12 0802) BP: (108-136)/(69-86) 125/86 (02/12 0802) SpO2:  [95 %-100 %] 95 % (02/12 0802)  Intake/Output from previous day: 02/11 0701 - 02/12 0700 In: 550 [I.V.:500; IV Piggyback:50] Out: 710 [Urine:700; Blood:10] Intake/Output this shift: No intake/output data recorded.  Recent Labs    05/10/23 1502 05/11/23 0716 05/12/23 0533  HGB 9.6* 10.2* 10.1*   Recent Labs    05/11/23 0716 05/12/23 0533  WBC 17.8* 15.6*  RBC 3.61* 3.66*  HCT 29.4* 30.0*  PLT 315 305   Recent Labs    05/11/23 0716 05/12/23 0533  NA 122* 130*  K 4.3 4.3  CL 89* 97*  CO2 21* 23  BUN 42* 39*  CREATININE 1.38* 1.21  GLUCOSE 335* 228*  CALCIUM 7.6* 7.8*   No results for input(s): "LABPT", "INR" in the last 72 hours.  EXAM General - Patient is Alert, Appropriate, and Oriented Extremity - Neurovascular intact Sensation intact distally Intact pulses distally Dorsiflexion/Plantar flexion intact Dressing - CDI, hemovac intact with out drainage Motor Function - intact, moving foot and toes well on exam.   Past Medical History:  Diagnosis Date   Abnormal posture    CHF (congestive heart failure) (HCC)    Chronic kidney disease    CKD   stage 2   Complication of anesthesia    heart stopped x2 during toe amputation   COPD (chronic obstructive pulmonary disease) (HCC)    Coronary artery disease, non-occlusive    a. LHC 05/2011: 10-20% ostial LAD stenosis   Depression    Diabetes mellitus    Grade I diastolic dysfunction    HLD  (hyperlipidemia)    Hypertension    Muscle weakness    Neuromuscular disorder (HCC)    neuropathy   NICM (nonischemic cardiomyopathy) (HCC)    a. TTE 2/213: EF 50-55%, mild LVH, normal wall motion, Gr2DD, mildly dilated LA; b. TTE 2/13: EF 40-45%, mild concentric LVH, global HK, no evidence of LV thrombus; c. TTE 4/19: EF 40%, diffuse HK, Gr2DD, mild MR, mildly dilated LA, RV cavity size normal w/ normal RVSF, PASP 63; d. TTE 4/19: EF 25-30%, diffuse HK, mild LVH, mild MR, mild PASP; e. TTE 5/19: EF 20-25%, diffuse HK, moderate MR,   NSVT (nonsustained ventricular tachycardia) (HCC)    PAD (peripheral artery disease) (HCC)    a. s/p right BKA s/p  recent balloon angioplasty fo the left peroneal artery, left posterior tibial artery, tibioperoneal trunk, and stenting to the distal left SFA in 07/2017   PEA (Pulseless electrical activity) (HCC)    heart stopped beating during surgery for amputation of toes    Assessment/Plan:   1 Day Post-Op Procedure(s) (LRB): ARTHROSCOPIC IRRIGATION AND DEBRIDEMENT LEFT KNEE, SYNOVECTOMY (Left) Principal Problem:   Left knee pain Active Problems:   TOBACCO DEPENDENCE   Foot osteomyelitis, left (HCC)   Type II diabetes mellitus with renal manifestations (HCC)   HLD (hyperlipidemia)   HTN (hypertension)   Chronic diastolic CHF (congestive heart failure) (HCC)   Chronic kidney disease, stage 3a (HCC)   COPD (  chronic obstructive pulmonary disease) (HCC)   CAD (coronary artery disease)   Hyponatremia   Depression   Effusion of left knee   Pyogenic arthritis of left knee joint (HCC)  Estimated body mass index is 28.43 kg/m as calculated from the following:   Height as of this encounter: 6' (1.829 m).   Weight as of this encounter: 95.1 kg. Advance diet D/C IV fluids Continue with IV abx, cultures pending. ID consulted Remove hemovac tomorrow Labs and Vital signs stable Pain well controlled   DVT Prophylaxis - TED hose and SCDs   heparin Weight-Bearing as tolerated to left leg   T. Cranston Neighbor, PA-C [facility] 05/12/2023, 8:06 AM

## 2023-05-13 ENCOUNTER — Inpatient Hospital Stay: Payer: 59

## 2023-05-13 ENCOUNTER — Encounter: Payer: Self-pay | Admitting: Internal Medicine

## 2023-05-13 DIAGNOSIS — L03116 Cellulitis of left lower limb: Secondary | ICD-10-CM | POA: Diagnosis not present

## 2023-05-13 DIAGNOSIS — M869 Osteomyelitis, unspecified: Secondary | ICD-10-CM | POA: Diagnosis not present

## 2023-05-13 DIAGNOSIS — M00862 Arthritis due to other bacteria, left knee: Secondary | ICD-10-CM | POA: Diagnosis not present

## 2023-05-13 LAB — CBC
HCT: 28.4 % — ABNORMAL LOW (ref 39.0–52.0)
Hemoglobin: 9.6 g/dL — ABNORMAL LOW (ref 13.0–17.0)
MCH: 28.2 pg (ref 26.0–34.0)
MCHC: 33.8 g/dL (ref 30.0–36.0)
MCV: 83.5 fL (ref 80.0–100.0)
Platelets: 352 10*3/uL (ref 150–400)
RBC: 3.4 MIL/uL — ABNORMAL LOW (ref 4.22–5.81)
RDW: 13.4 % (ref 11.5–15.5)
WBC: 13 10*3/uL — ABNORMAL HIGH (ref 4.0–10.5)
nRBC: 0 % (ref 0.0–0.2)

## 2023-05-13 LAB — BASIC METABOLIC PANEL
Anion gap: 10 (ref 5–15)
BUN: 38 mg/dL — ABNORMAL HIGH (ref 8–23)
CO2: 21 mmol/L — ABNORMAL LOW (ref 22–32)
Calcium: 7.7 mg/dL — ABNORMAL LOW (ref 8.9–10.3)
Chloride: 100 mmol/L (ref 98–111)
Creatinine, Ser: 1.21 mg/dL (ref 0.61–1.24)
GFR, Estimated: >60 mL/min (ref >60)
Glucose, Bld: 261 mg/dL — ABNORMAL HIGH (ref 70–99)
Potassium: 4 mmol/L (ref 3.5–5.1)
Sodium: 131 mmol/L — ABNORMAL LOW (ref 135–145)

## 2023-05-13 LAB — GLUCOSE, CAPILLARY
Glucose-Capillary: 267 mg/dL — ABNORMAL HIGH (ref 70–99)
Glucose-Capillary: 241 mg/dL — ABNORMAL HIGH (ref 70–99)
Glucose-Capillary: 273 mg/dL — ABNORMAL HIGH (ref 70–99)
Glucose-Capillary: 202 mg/dL — ABNORMAL HIGH (ref 70–99)

## 2023-05-13 NOTE — Consult Note (Signed)
ORTHOPAEDIC CONSULTATION  REQUESTING PHYSICIAN: Loyce Dys, MD  Chief Complaint: Left foot infection  HPI: Miguel Harvey is a 78 y.o. male who complains of recent swelling of left knee.  Underwent irrigation debridement of the left knee with cultures.  Patient had noted ulceration to the left foot and is concerning for possible site of contamination.  Has undergone previous transmetatarsal amputation several years ago.  Longstanding history of diabetes.  Is status post right-sided BKA.  Past Medical History:  Diagnosis Date   Abnormal posture    CHF (congestive heart failure) (HCC)    Chronic kidney disease    CKD   stage 2   Complication of anesthesia    heart stopped x2 during toe amputation   COPD (chronic obstructive pulmonary disease) (HCC)    Coronary artery disease, non-occlusive    a. LHC 05/2011: 10-20% ostial LAD stenosis   Depression    Diabetes mellitus    Grade I diastolic dysfunction    HLD (hyperlipidemia)    Hypertension    Muscle weakness    Neuromuscular disorder (HCC)    neuropathy   NICM (nonischemic cardiomyopathy) (HCC)    a. TTE 2/213: EF 50-55%, mild LVH, normal wall motion, Gr2DD, mildly dilated LA; b. TTE 2/13: EF 40-45%, mild concentric LVH, global HK, no evidence of LV thrombus; c. TTE 4/19: EF 40%, diffuse HK, Gr2DD, mild MR, mildly dilated LA, RV cavity size normal w/ normal RVSF, PASP 63; d. TTE 4/19: EF 25-30%, diffuse HK, mild LVH, mild MR, mild PASP; e. TTE 5/19: EF 20-25%, diffuse HK, moderate MR,   NSVT (nonsustained ventricular tachycardia) (HCC)    PAD (peripheral artery disease) (HCC)    a. s/p right BKA s/p  recent balloon angioplasty fo the left peroneal artery, left posterior tibial artery, tibioperoneal trunk, and stenting to the distal left SFA in 07/2017   PEA (Pulseless electrical activity) (HCC)    heart stopped beating during surgery for amputation of toes   Past Surgical History:  Procedure Laterality Date   AMPUTATION Right  07/02/2017   Procedure: RIGHT BELOW KNEE AMPUTATION;  Surgeon: Nadara Mustard, MD;  Location: [facility] OR;  Service: Orthopedics;  Laterality: Right;   AMPUTATION TOE Left 05/11/2021   Procedure: LEFT GREAT  TOE AMPUTATION;  Surgeon: Gwyneth Revels, DPM;  Location: [facility] ORS;  Service: Podiatry;  Laterality: Left;   CARDIAC CATHETERIZATION     CATARACT EXTRACTION W/PHACO Left 11/08/2018   Procedure: CATARACT EXTRACTION PHACO AND INTRAOCULAR LENS PLACEMENT (IOC)  LEFT VISION BLUE;  Surgeon: Elliot Cousin, MD;  Location: [facility] ORS;  Service: Ophthalmology;  Laterality: Left;  [place] 03:38.9 CDE 42.37 Fluid pack lot # [id number]   CATARACT EXTRACTION W/PHACO Right 03/25/2023   Procedure: CATARACT EXTRACTION PHACO AND INTRAOCULAR LENS PLACEMENT (IOC) RIGHT DIABETIC MALYUGIN OMIDRIA 24.82 02:08.6;  Surgeon: Estanislado Pandy, MD;  Location: [facility];  Service: Ophthalmology;  Laterality: Right;   cyst removal from  right leg about 6 years ago     IRRIGATION AND DEBRIDEMENT FOOT Left 08/10/2017   Procedure: IRRIGATION AND DEBRIDEMENT FOOT;  Surgeon: Gwyneth Revels, DPM;  Location: [facility] ORS;  Service: Podiatry;  Laterality: Left;   KNEE ARTHROSCOPY Left 05/11/2023   Procedure: ARTHROSCOPIC IRRIGATION AND DEBRIDEMENT LEFT KNEE, SYNOVECTOMY;  Surgeon: Reinaldo Berber, MD;  Location: [facility] ORS;  Service: Orthopedics;  Laterality: Left;   LEFT HEART CATHETERIZATION WITH CORONARY ANGIOGRAM N/A 05/11/2011   Procedure: LEFT HEART CATHETERIZATION WITH CORONARY ANGIOGRAM;  Surgeon: Lennette Bihari, MD;  Location: [facility];  Service: Cardiovascular;  Laterality: N/A;   LOWER EXTREMITY ANGIOGRAPHY Left 08/13/2017   Procedure: Lower Extremity Angiography and PICC line placement;  Surgeon: Annice Needy, MD;  Location: [facility] INVASIVE CV LAB;  Service: Cardiovascular;  Laterality: Left;   ruptured navel     repair of   TOE AMPUTATION     not sure of when this surgery occured, but his heart stopped beating x 2 during  surgery   Social History   Socioeconomic History   Marital status: Single    Spouse name: danielle .... daughter   Number of children: Not on file   Years of education: Not on file   Highest education level: Not on file  Occupational History   Not on file  Tobacco Use   Smoking status: Every Day    Current packs/day: 0.00    Types: Cigarettes   Smokeless tobacco: Former    Quit date: 05/07/2011   Tobacco comments:    3 cigs per day (previous hx of smoking 1 PPD)  Vaping Use   Vaping status: Never Used  Substance and Sexual Activity   Alcohol use: Not Currently    Alcohol/week: 6.0 standard drinks of alcohol    Types: 6 Cans of beer per week   Drug use: Never   Sexual activity: Not on file  Other Topics Concern   Not on file  Social History Narrative   Not on file   Social Drivers of Health   Financial Resource Strain: Not on file  Food Insecurity: No Food Insecurity (05/11/2023)   Hunger Vital Sign    Worried About Running Out of Food in the Last Year: Never true    Ran Out of Food in the Last Year: Never true  Transportation Needs: No Transportation Needs (05/11/2023)   PRAPARE - Administrator, Civil Service (Medical): No    Lack of Transportation (Non-Medical): No  Physical Activity: Inactive (08/05/2017)   Exercise Vital Sign    Days of Exercise per Week: 0 days    Minutes of Exercise per Session: 0 min  Stress: No Stress Concern Present (08/05/2017)   [employer] of Occupational Health - Occupational Stress Questionnaire    Feeling of Stress : Not at all  Social Connections: Socially Isolated (05/11/2023)   Social Connection and Isolation Panel [NHANES]    Frequency of Communication with Friends and Family: Patient declined    Frequency of Social Gatherings with Friends and Family: Three times a week    Attends Religious Services: Never    Active Member of Clubs or Organizations: No    Attends Banker Meetings: Never    Marital  Status: Never married   Family History  Problem Relation Age of Onset   CAD Other    Diabetes Other    Allergies  Allergen Reactions   Spironolactone     Hyperkalemia (currently takes spironolactone)   Prior to Admission medications   Medication Sig Start Date End Date Taking? Authorizing Provider  furosemide (LASIX) 20 MG tablet Take 20 mg by mouth daily. D/c'd on 05/11/23 per [facility]   Yes [provider]  guaifenesin (ROBITUSSIN) 100 MG/5ML syrup Take 10 mLs by mouth every 8 (eight) hours as needed for cough.   Yes [provider]  ipratropium-albuterol (DUONEB) 0.5-2.5 (3) MG/3ML SOLN Take 3 mLs by nebulization every 6 (six) hours as needed.   Yes [provider]  aspirin EC 81 MG EC tablet Take 1  tablet (81 mg total) by mouth daily. Patient taking differently: Take 81 mg by mouth daily. D/c'd on 05/11/23 at 1858 per [facility] 08/14/17   Enedina Finner, MD  atorvastatin (LIPITOR) 20 MG tablet Take 20 mg by mouth every evening.    [provider]  carboxymethylcellulose (REFRESH PLUS) 0.5 % SOLN Place 1 drop into both eyes 4 (four) times daily.    [provider]  carvedilol (COREG) 6.25 MG tablet Take 6.25 mg by mouth 2 (two) times daily with a meal. Patient not taking: Reported on 12/28/2022    [provider]  clopidogrel (PLAVIX) 75 MG tablet Take 1 tablet (75 mg total) by mouth daily. Patient taking differently: Take 75 mg by mouth daily. D/c'd on 05/11/23 per [facility] 08/14/17   Enedina Finner, MD  Continuous Glucose Receiver (DEXCOM G7 RECEIVER) DEVI 1 Device by Does not apply route continuous. 02/18/23   Motwani, Carin Hock, MD  Continuous Glucose Sensor (DEXCOM G7 SENSOR) MISC 1 Device by Does not apply route continuous. 02/18/23   Motwani, Carin Hock, MD  Cyanocobalamin (B-12 PO) Take 500 mcg by mouth daily at 2 am.    [provider]  insulin glargine, 2 Unit Dial, (TOUJEO MAX SOLOSTAR) 300 UNIT/ML Solostar Pen Inject 20 Units into the skin daily at  6 (six) AM.    [provider]  Insulin Lispro-aabc (LYUMJEV) 100 UNIT/ML SOLN Inject 8 Units as directed daily at 6 (six) AM. BASED ON SLIDING SCALE    [provider]  latanoprost (XALATAN) 0.005 % ophthalmic solution Place 1 drop into both eyes at bedtime.    [provider]  linagliptin (TRADJENTA) 5 MG TABS tablet Take 5 mg by mouth daily.    [provider]  lisinopril (ZESTRIL) 5 MG tablet Take 5 mg by mouth daily.    [provider]  Magnesium 400 MG TABS Take 400 mg by mouth 2 (two) times daily.     [provider]  Multiple Vitamin (MULTIVITAMIN WITH MINERALS) TABS tablet Take 1 tablet by mouth daily.    [provider]  sertraline (ZOLOFT) 50 MG tablet Take 50 mg by mouth daily.    [provider]  traZODone (DESYREL) 50 MG tablet Take 0.5 mg by mouth at bedtime. Patient not taking: Reported on 02/18/2023    [provider]   No results found.  Positive ROS: All other systems have been reviewed and were otherwise negative with the exception of those mentioned in the HPI and as above.  12 point ROS was performed.  Physical Exam: General: Alert and oriented.  No apparent distress.  Vascular:  Left foot:Dorsalis Pedis:  thready Posterior Tibial:  absent  Right foot:  Status post BKA  Neuro: Absent protective sensation  Derm: At the left transmetatarsal amputation site there is noted ulceration to the plantar aspect.  This probes directly down to bone.  There was a scant amount of purulent drainage noted from the ulcerative site.  Ulceration measures about a centimeter in diameter.  Limited darkened discoloration to the medial arch is noted.  Ortho/MS: Status post transmetatarsal amputation left foot.  Status post right BKA.  I personally evaluated the left foot x-rays.  It shows previous amputation of the great toe at the MTPJ with amputations of the second third fourth toes and metatarsal heads.  A  partial fifth ray amputation at the midshaft has been performed as well.  Soft tissue ulceration is noted around the fourth metatarsal head region.  No obvious erosive  changes noted at this time.  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  I am going to order an MRI to evaluate the extent of the infection.  Most likely at least will need removal of the distal fourth metatarsal but will likely give consideration to further transmetatarsal amputation to realign the metatarsal parabola to take stress off of this area.  Had a surgical discussion with the patient.  Patient understands the risks and benefits associated with surgery.  Will plan for surgery tomorrow.  Orders have been written.  Will perform intraoperative wound and bone cultures.    Irean Hong, DPM Cell [phone]   05/13/2023 5:49 PM

## 2023-05-13 NOTE — TOC Initial Note (Signed)
Transition of Care [facility]) - Initial/Assessment Note    Patient Details  Name: Miguel Harvey MRN: [id number] Date of Birth: May 18, 1945  Transition of [facility]) CM/SW Contact:    Allena Katz, LCSW Phone Number: 05/13/2023, 10:41 AM  Clinical Narrative:   CSW spoke with patient about going to rehab. Pt states that he is from [facility] and wants to return. He states that he also does not want any HH. CSW will contact [facility] to see if patient is able to return.   Expected Discharge Plan: Assisted Living     Patient Goals and CMS Choice            Expected Discharge Plan and Services       Living arrangements for the past 2 months: Assisted Living Facility                                      Prior Living Arrangements/Services Living arrangements for the past 2 months: Assisted Living Facility Lives with:: Facility Resident   Do you feel safe going back to the place where you live?: Yes               Activities of Daily Living   ADL Screening (condition at time of admission) Independently performs ADLs?: No Does the patient have a NEW difficulty with bathing/dressing/toileting/self-feeding that is expected to last >3 days?: No Does the patient have a NEW difficulty with getting in/out of bed, walking, or climbing stairs that is expected to last >3 days?: No Does the patient have a NEW difficulty with communication that is expected to last >3 days?: No Is the patient deaf or have difficulty hearing?: No Does the patient have difficulty seeing, even when wearing glasses/contacts?: No Does the patient have difficulty concentrating, remembering, or making decisions?: No  Permission Sought/Granted                  Emotional Assessment       Orientation: : Oriented to Self, Oriented to Place, Oriented to  Time, Oriented to Situation      Admission diagnosis:  Cellulitis of left lower extremity [L03.116] Patient Active Problem List    Diagnosis Date Noted   Cellulitis of left lower extremity 05/12/2023   Effusion of left knee 05/11/2023   Pyogenic arthritis of left knee joint (HCC) 05/11/2023   Left knee pain 05/10/2023   Type II diabetes mellitus with renal manifestations (HCC) 05/10/2023   HLD (hyperlipidemia) 05/10/2023   HTN (hypertension) 05/10/2023   Chronic diastolic CHF (congestive heart failure) (HCC) 05/10/2023   Chronic kidney disease, stage 3a (HCC) 05/10/2023   COPD (chronic obstructive pulmonary disease) (HCC) 05/10/2023   CAD (coronary artery disease) 05/10/2023   Hyponatremia 05/10/2023   Depression 05/10/2023   Fall    Positive blood cultures 05/12/2021   SIRS (systemic inflammatory response syndrome) (HCC) 11/17/2020   Hypertension associated with diabetes (HCC) 11/17/2020   Chronic systolic CHF (congestive heart failure) (HCC) 11/17/2020   PAD (peripheral artery disease) (HCC) 03/25/2018   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) 09/21/2017   Foot osteomyelitis, left (HCC) 08/05/2017   Pressure injury of skin 06/29/2017   Sepsis (HCC) 06/28/2017   Diabetic foot infection (HCC) 06/28/2017   Elevated troponin 06/28/2017   Pulmonary nodule 07/01/2011   Acute diastolic heart failure, 2D EF 40-98% with grade 2 diastloic dysfunction 05/12/2011   Family history of early  CAD, brother died 6 CAD 05/12/2011   Hyperlipidemia associated with type 2 diabetes mellitus (HCC) 05/12/2011   NICM, minor CAD at cath, EF 25% at cath, 55% by 2D 05/12/2011   Type 2 diabetes mellitus with complication, with long-term current use of insulin (HCC) 05/07/2011   HTN (hypertension), malignant 05/07/2011   TOBACCO DEPENDENCE 05/27/2006   PCP:  [name], MD Pharmacy:   [facility] [place], [place] - [address] [place] [address] Phone: [phone] Fax: [phone]     Social Drivers of Health (SDOH) Social History: SDOH Screenings    Food Insecurity: No Food Insecurity (05/11/2023)  Housing: Unknown (05/11/2023)  Transportation Needs: No Transportation Needs (05/11/2023)  Utilities: Not At Risk (05/11/2023)  Physical Activity: Inactive (08/05/2017)  Social Connections: Socially Isolated (05/11/2023)  Stress: No Stress Concern Present (08/05/2017)  Tobacco Use: High Risk (05/10/2023)   SDOH Interventions:     Readmission Risk Interventions     No data to display

## 2023-05-13 NOTE — Progress Notes (Signed)
Date of Admission:  05/10/2023      ID: Miguel Harvey is a 78 y.o. male  Principal Problem:   Left knee pain Active Problems:   TOBACCO DEPENDENCE   Foot osteomyelitis, left (HCC)   Type II diabetes mellitus with renal manifestations (HCC)   HLD (hyperlipidemia)   HTN (hypertension)   Chronic diastolic CHF (congestive heart failure) (HCC)   Chronic kidney disease, stage 3a (HCC)   COPD (chronic obstructive pulmonary disease) (HCC)   CAD (coronary artery disease)   Hyponatremia   Depression   Effusion of left knee   Pyogenic arthritis of left knee joint (HCC)   Cellulitis of left lower extremity    Subjective: Doing better Says pain left knee better Drain removed  Medications:   aspirin EC  81 mg Oral Daily   atorvastatin  20 mg Oral QPM   carvedilol  6.25 mg Oral BID WC   clopidogrel  75 mg Oral Daily   docusate sodium  100 mg Oral BID   feeding supplement  237 mL Oral BID BM   furosemide  20 mg Oral Daily   heparin  5,000 Units Subcutaneous Q8H   insulin aspart  0-5 Units Subcutaneous QHS   insulin aspart  0-9 Units Subcutaneous TID WC   insulin aspart  5 Units Subcutaneous TID WC   insulin glargine-yfgn  10 Units Subcutaneous Daily   latanoprost  1 drop Both Eyes QHS   multivitamin with minerals  1 tablet Oral Daily   mupirocin cream   Topical Daily   nicotine  21 mg Transdermal Daily   polyvinyl alcohol  1 drop Both Eyes QID   sertraline  50 mg Oral Daily   sodium chloride  1 g Oral BID WC   vitamin B-12  100 mcg Oral Daily    Objective: Vital signs in last 24 hours: Patient Vitals for the past 24 hrs:  BP Temp Temp src Pulse Resp SpO2  05/13/23 0739 (!) 147/81 98.6 F (37 C) -- 83 16 96 %  05/13/23 0416 123/69 98.3 F (36.8 C) Oral 78 18 99 %  05/12/23 1939 116/70 98.4 F (36.9 C) Oral 88 20 100 %  05/12/23 1648 126/67 97.7 F (36.5 C) -- 76 16 97 %     PHYSICAL EXAM:  General: Alert, cooperative, no distress, appears stated age.  Lungs:  Clear to auscultation bilaterally. No Wheezing or Rhonchi. No rales. Heart: Regular rate and rhythm, no murmur, rub or gallop. Abdomen: Soft, non-tender,not distended. Bowel sounds normal. No masses Extremities: left knee dressing not removed Left TMA- chronic wound lateral aspect Skin: No rashes or lesions. Or bruising Lymph: Cervical, supraclavicular normal. Neurologic: Grossly non-focal  Lab Results    Latest Ref Rng & Units 05/13/2023    4:27 AM 05/12/2023    5:33 AM 05/11/2023    7:16 AM  CBC  WBC 4.0 - 10.5 K/uL 13.0  15.6  17.8   Hemoglobin 13.0 - 17.0 g/dL 9.6  82.9  56.2   Hematocrit 39.0 - 52.0 % 28.4  30.0  29.4   Platelets 150 - 400 K/uL 352  305  315        Latest Ref Rng & Units 05/13/2023    4:27 AM 05/12/2023    5:33 AM 05/11/2023    7:16 AM  CMP  Glucose 70 - 99 mg/dL 130  865  784   BUN 8 - 23 mg/dL 38  39  42   Creatinine 0.61 -  1.24 mg/dL 4.54  0.98  1.19   Sodium 135 - 145 mmol/L 131  130  122   Potassium 3.5 - 5.1 mmol/L 4.0  4.3  4.3   Chloride 98 - 111 mmol/L 100  97  89   CO2 22 - 32 mmol/L 21  23  21    Calcium 8.9 - 10.3 mg/dL 7.7  7.8  7.6       Microbiology: [facility]- NG 2/11- knee culture pending Studies/Results: No results found.   Assessment/Plan: Left knee septic arthritis s/p I.D on vanco and ceftriaxone-  Synovial fluid culture still pending- I dont think any culture was sent from OR   Left foot TMA and there is a chronic discharging wound.on the lateral aspect which could be the soruce   Need to culture the wound Podiatry consult     Anemia   PAD- rt BKA   CKD improving   DM   CAD managemet as per primary team   Discussed with patient  Will discuss with hospitalist

## 2023-05-13 NOTE — Anesthesia Postprocedure Evaluation (Signed)
Anesthesia Post Note  Patient: KARIS EMIG  Procedure(s) Performed: ARTHROSCOPIC IRRIGATION AND DEBRIDEMENT LEFT KNEE, SYNOVECTOMY (Left: Knee)  Patient location during evaluation: PACU Anesthesia Type: General Level of consciousness: awake and alert Pain management: pain level controlled Vital Signs Assessment: post-procedure vital signs reviewed and stable Respiratory status: spontaneous breathing, nonlabored ventilation and respiratory function stable Cardiovascular status: blood pressure returned to baseline and stable Postop Assessment: no apparent nausea or vomiting Anesthetic complications: no   No notable events documented.   Last Vitals:  Vitals:   05/12/23 1939 05/13/23 0416  BP: 116/70 123/69  Pulse: 88 78  Resp: 20 18  Temp: 36.9 C 36.8 C  SpO2: 100% 99%    Last Pain:  Vitals:   05/13/23 0454  TempSrc:   PainSc: 4                  Foye Deer

## 2023-05-13 NOTE — Plan of Care (Signed)

## 2023-05-13 NOTE — Progress Notes (Addendum)
Subjective: 2 Days Post-Op Procedure(s) (LRB): ARTHROSCOPIC IRRIGATION AND DEBRIDEMENT LEFT KNEE, SYNOVECTOMY (Left) Patient reports pain as mild. Improving.  Patient is well, and has had no acute complaints or problems Denies any CP, SOB, ABD pain. We will continue therapy today.   Objective: Vital signs in last 24 hours: Temp:  [97.7 F (36.5 C)-98.6 F (37 C)] 98.6 F (37 C) (02/13 0739) Pulse Rate:  [76-88] 83 (02/13 0739) Resp:  [16-20] 16 (02/13 0739) BP: (116-147)/(67-81) 147/81 (02/13 0739) SpO2:  [96 %-100 %] 96 % (02/13 0739)  Intake/Output from previous day: 02/12 0701 - 02/13 0700 In: 520.9 [P.O.:120; IV Piggyback:400.9] Out: 1000 [Urine:1000] Intake/Output this shift: No intake/output data recorded.  Recent Labs    05/11/23 0716 05/12/23 0533 05/13/23 0427  HGB 10.2* 10.1* 9.6*   Recent Labs    05/12/23 0533 05/13/23 0427  WBC 15.6* 13.0*  RBC 3.66* 3.40*  HCT 30.0* 28.4*  PLT 305 352   Recent Labs    05/12/23 0533 05/13/23 0427  NA 130* 131*  K 4.3 4.0  CL 97* 100  CO2 23 21*  BUN 39* 38*  CREATININE 1.21 1.21  GLUCOSE 228* 261*  CALCIUM 7.8* 7.7*   No results for input(s): "LABPT", "INR" in the last 72 hours.  EXAM General - Patient is Alert, Appropriate, and Oriented Extremity - Neurovascular intact Sensation intact distally Intact pulses distally Dorsiflexion/Plantar flexion intact Dressing - CDI, hemovac intact with out drainage. Hemovac removed today Motor Function - intact, moving foot and toes well on exam.   Past Medical History:  Diagnosis Date   Abnormal posture    CHF (congestive heart failure) (HCC)    Chronic kidney disease    CKD   stage 2   Complication of anesthesia    heart stopped x2 during toe amputation   COPD (chronic obstructive pulmonary disease) (HCC)    Coronary artery disease, non-occlusive    a. LHC 05/2011: 10-20% ostial LAD stenosis   Depression    Diabetes mellitus    Grade I diastolic  dysfunction    HLD (hyperlipidemia)    Hypertension    Muscle weakness    Neuromuscular disorder (HCC)    neuropathy   NICM (nonischemic cardiomyopathy) (HCC)    a. TTE 2/213: EF 50-55%, mild LVH, normal wall motion, Gr2DD, mildly dilated LA; b. TTE 2/13: EF 40-45%, mild concentric LVH, global HK, no evidence of LV thrombus; c. TTE 4/19: EF 40%, diffuse HK, Gr2DD, mild MR, mildly dilated LA, RV cavity size normal w/ normal RVSF, PASP 63; d. TTE 4/19: EF 25-30%, diffuse HK, mild LVH, mild MR, mild PASP; e. TTE 5/19: EF 20-25%, diffuse HK, moderate MR,   NSVT (nonsustained ventricular tachycardia) (HCC)    PAD (peripheral artery disease) (HCC)    a. s/p right BKA s/p  recent balloon angioplasty fo the left peroneal artery, left posterior tibial artery, tibioperoneal trunk, and stenting to the distal left SFA in 07/2017   PEA (Pulseless electrical activity) (HCC)    heart stopped beating during surgery for amputation of toes    Assessment/Plan:   2 Days Post-Op Procedure(s) (LRB): ARTHROSCOPIC IRRIGATION AND DEBRIDEMENT LEFT KNEE, SYNOVECTOMY (Left) Principal Problem:   Left knee pain Active Problems:   TOBACCO DEPENDENCE   Foot osteomyelitis, left (HCC)   Type II diabetes mellitus with renal manifestations (HCC)   HLD (hyperlipidemia)   HTN (hypertension)   Chronic diastolic CHF (congestive heart failure) (HCC)   Chronic kidney disease, stage 3a (HCC)  COPD (chronic obstructive pulmonary disease) (HCC)   CAD (coronary artery disease)   Hyponatremia   Depression   Effusion of left knee   Pyogenic arthritis of left knee joint (HCC)   Cellulitis of left lower extremity  Estimated body mass index is 28.43 kg/m as calculated from the following:   Height as of this encounter: 6' (1.829 m).   Weight as of this encounter: 95.1 kg. Advance diet D/C IV fluids Continue with IV abx Per ID. Currently on Vanc and rocephin Labs and Vital signs stable Pain well controlled   DVT  Prophylaxis - TED hose and SCDs  heparin Weight-Bearing as tolerated to left leg   T. Cranston Neighbor, PA-C [facility] 05/13/2023, 4:43 PM

## 2023-05-13 NOTE — Inpatient Diabetes Management (Signed)
Inpatient Diabetes Program Recommendations  AACE/ADA: New Consensus Statement on Inpatient Glycemic Control (2015)  Target Ranges:  Prepandial:   less than 140 mg/dL      Peak postprandial:   less than 180 mg/dL (1-2 hours)      Critically ill patients:  140 - 180 mg/dL    Latest Reference Range & Units 05/12/23 08:22 05/12/23 12:17 05/12/23 16:49 05/12/23 21:29  Glucose-Capillary 70 - 99 mg/dL 161 (H)  10 units Novolog  210 (H)  8 units Novolog  10 units Semglee  241 (H)  8 units Novolog  327 (H)  3 units Novolog   (H): Data is abnormally high   Latest Reference Range & Units 05/13/23 07:37  Glucose-Capillary 70 - 99 mg/dL 096 (H)  10 units Novolog  10 units Semglee  (H): Data is abnormally high  Home DM Meds: Toujeo 20 units daily at 6am          Lyumjev 8 units at 6am     Tradjenta 5 mg daily     Trulicity 1.5 mg Qweek (not taking)      Dexcom G7 CGM   Current Orders: Semglee 10 units daily      Novolog Sensitive Correction Scale/ SSI (0-9 units) TID AC + HS      Novolog 5 units TID with meals    MD- Please consider:  1. Increase Semglee to 15 units Daily  2. Increase Novolog Meal Coverage to 8 units TID    --Will follow patient during hospitalization--  Ambrose Finland RN, MSN, CDCES Diabetes Coordinator Inpatient Glycemic Control Team Team Pager: [phone] (8a-5p)

## 2023-05-13 NOTE — Plan of Care (Signed)
Problem: Education: Goal: Ability to describe self-care measures that may prevent or decrease complications (Diabetes Survival Skills Education) will improve Outcome: Progressing Goal: Individualized Educational Video(s) Outcome: Progressing   Problem: Coping: Goal: Ability to adjust to condition or change in health will improve Outcome: Progressing   Problem: Fluid Volume: Goal: Ability to maintain a balanced intake and output will improve Outcome: Progressing   Problem: Health Behavior/Discharge Planning: Goal: Ability to identify and utilize available resources and services will improve Outcome: Progressing Goal: Ability to manage health-related needs will improve Outcome: Progressing   Problem: Metabolic: Goal: Ability to maintain appropriate glucose levels will improve Outcome: Progressing   Problem: Nutritional: Goal: Maintenance of adequate nutrition will improve Outcome: Progressing Goal: Progress toward achieving an optimal weight will improve Outcome: Progressing   Problem: Skin Integrity: Goal: Risk for impaired skin integrity will decrease Outcome: Progressing   Problem: Tissue Perfusion: Goal: Adequacy of tissue perfusion will improve Outcome: Progressing   Problem: Education: Goal: Knowledge of General Education information will improve Description: Including pain rating scale, medication(s)/side effects and non-pharmacologic comfort measures Outcome: Progressing   Problem: Health Behavior/Discharge Planning: Goal: Ability to manage health-related needs will improve Outcome: Progressing   Problem: Clinical Measurements: Goal: Ability to maintain clinical measurements within normal limits will improve Outcome: Progressing Goal: Will remain free from infection Outcome: Progressing Goal: Diagnostic test results will improve Outcome: Progressing Goal: Respiratory complications will improve Outcome: Progressing Goal: Cardiovascular complication will  be avoided Outcome: Progressing   Problem: Activity: Goal: Risk for activity intolerance will decrease Outcome: Progressing   Problem: Nutrition: Goal: Adequate nutrition will be maintained Outcome: Progressing   Problem: Coping: Goal: Level of anxiety will decrease Outcome: Progressing   Problem: Elimination: Goal: Will not experience complications related to bowel motility Outcome: Progressing Goal: Will not experience complications related to urinary retention Outcome: Progressing   Problem: Pain Managment: Goal: General experience of comfort will improve and/or be controlled Outcome: Progressing   Problem: Safety: Goal: Ability to remain free from injury will improve Outcome: Progressing   Problem: Skin Integrity: Goal: Risk for impaired skin integrity will decrease Outcome: Progressing   Problem: Clinical Measurements: Goal: Ability to avoid or minimize complications of infection will improve Outcome: Progressing   Problem: Skin Integrity: Goal: Skin integrity will improve Outcome: Progressing   Problem: Education: Goal: Knowledge of General Education information will improve Description: Including pain rating scale, medication(s)/side effects and non-pharmacologic comfort measures Outcome: Progressing   Problem: Health Behavior/Discharge Planning: Goal: Ability to manage health-related needs will improve Outcome: Progressing   Problem: Clinical Measurements: Goal: Ability to maintain clinical measurements within normal limits will improve Outcome: Progressing Goal: Will remain free from infection Outcome: Progressing Goal: Diagnostic test results will improve Outcome: Progressing Goal: Respiratory complications will improve Outcome: Progressing Goal: Cardiovascular complication will be avoided Outcome: Progressing   Problem: Activity: Goal: Risk for activity intolerance will decrease Outcome: Progressing   Problem: Nutrition: Goal: Adequate  nutrition will be maintained Outcome: Progressing   Problem: Coping: Goal: Level of anxiety will decrease Outcome: Progressing   Problem: Elimination: Goal: Will not experience complications related to bowel motility Outcome: Progressing Goal: Will not experience complications related to urinary retention Outcome: Progressing   Problem: Pain Managment: Goal: General experience of comfort will improve and/or be controlled Outcome: Progressing   Problem: Safety: Goal: Ability to remain free from injury will improve Outcome: Progressing   Problem: Skin Integrity: Goal: Risk for impaired skin integrity will decrease Outcome: Progressing   Problem: Education: Goal: Verbalization  of understanding the information provided (i.e., activity precautions, restrictions, etc) will improve Outcome: Progressing Goal: Individualized Educational Video(s) Outcome: Progressing

## 2023-05-13 NOTE — Progress Notes (Addendum)
Progress Note   Patient: Miguel Harvey:[id number] DOB: Apr 22, 1945 DOA: 05/10/2023     1 DOS: the patient was seen and examined on 05/13/2023    Brief hospital course:    Hospital course / significant events:    HPI: Miguel Harvey is a 78 y.o. male with medical history significant of HTN, HLD, DM, dCHF, COPD on 2L O2, CAD, depression, CKD-3A, PEA, NSVT, chronic left foot osteomyelitis, s/p right BKA, tobacco abuse, who presents to ED via [facility] from [facility] 05/10/23. Complaints left knee pain x3 days, constant, sharp, severe, nonradiating, aggravated by movement, notes swelling with warmth.  No fever or chills. He has chronic left foot ulcer without drainage. Facility has been treating for LLE cellulitis, got 2L NS at facility.    02/10: X-ray of left foot is negative for signs of osteomyelitis. XR knee no fx, (+)suprapatellar effusion, (+)tricompartmental arthritis.  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 Patient is on broad antibiotics as above Wound care on board I have consulted podiatrist and case discussed Podiatrist planning possible surgery tomorrow   Hyponatremia + Pseudohyponatremia d/t hyperglycemia - Sodium corrects to 128 (on BMP Na 122, Glc 335) Mental status normal.   Question fluid overload chronic HFpEF urine sodium,  urine osmolality, serum osmolality. Fluid restriction Sodium chloride tablet 1 g twice daily   Type II diabetes mellitus with hyperglycemia Recent A1c 9.8, patient is taking NovoLog, glargine insulin 10 units daily Sliding scale insulin increased today  Glargine insulin 7 units daily increased to 10    COPD (chronic obstructive pulmonary disease)   Stable Continue bronchodilators and as needed Mucinex   HLD (hyperlipidemia) Continue Lipitor   HTN (hypertension) IV hydralazine as needed Coreg Lasix Hold lisinopril   CAD (coronary artery disease) No chest pain Resumed Plavix Continue aspirin, Lipitor   Chronic diastolic CHF (congestive heart failure)   2D echo on 05/25/2019 showed EF of 50-55% with grade 1 diastolic dysfunction.  No leg edema JVD.  CHF is compensated, BNP elevated  Continue Lasix   Chronic kidney disease, stage 3a Cautious lasix    TOBACCO DEPENDENCE Continue nicotine patch    Depression Continue home medication   overweight based on BMI: Body mass index is 28.43 kg/m.  Counseled on weight loss when medically stable     DVT prophylaxis: heparin     Code Status: FULL CODE   TOC needs: TBD, expect back to his long term care    Barriers to dispo / significant pending items: Awaiting culture results and clearance by orthopedic surgeon and ID   Family Communication: none at this time      Subjective:  Patient admits to improvement in left knee pain Denies nausea vomiting abdominal pain or chest pain Loss of appetite improved   Physical Exam: General: Not in acute distress Heme: No neck lymph node enlargement. Cardiac: S1/S2,  RRR, No murmurs, No gallops or rubs. Respiratory: No rales, wheezing, rhonchi or rubs. GI: Soft, nondistended, nontender, no rebound pain, no organomegaly, BS present. GU: No hematuria Ext: No pitting leg edema bilaterally. 1+DP/PT pulse bilaterally. S/p of right BKA.  Dressing on the left knee is clean and dry     Data Reviewed:    Latest Ref Rng & Units 05/13/2023    4:27 AM 05/12/2023    5:33 AM 05/11/2023    7:16 AM  BMP  Glucose 70 - 99 mg/dL 951  884  166   BUN 8 - 23 mg/dL 38  39  42   Creatinine 0.61 - 1.24 mg/dL 0.63  0.16  0.10   Sodium 135 - 145 mmol/L 131  130  122   Potassium 3.5 - 5.1 mmol/L 4.0  4.3  4.3   Chloride 98 - 111 mmol/L 100  97  89   CO2 22 - 32 mmol/L 21  23  21    Calcium 8.9 - 10.3 mg/dL 7.7  7.8  7.6        Latest Ref Rng & Units 05/13/2023    4:27 AM 05/12/2023    5:33 AM 05/11/2023    7:16 AM  CBC  WBC 4.0 - 10.5 K/uL 13.0  15.6  17.8   Hemoglobin 13.0 - 17.0 g/dL 9.6  93.2  35.5   Hematocrit 39.0 - 52.0 % 28.4  30.0  29.4   Platelets 150 - 400 K/uL 352  305  315     Vitals:   05/12/23 1939 05/13/23 0416 05/13/23 0739 05/13/23 1747  BP: 116/70 123/69 (!) 147/81 (!) 146/78  Pulse: 88 78 83 80  Resp: 20 18 16 19   Temp: 98.4 F (36.9 C) 98.3 F (36.8 C) 98.6 F (37 C) 98.5 F (36.9 C)  TempSrc: Oral Oral    SpO2: 100% 99% 96% 97%  Weight:      Height:        Author: Loyce Dys, MD 05/13/2023 6:11 PM  For on call review [email or web address].

## 2023-05-14 ENCOUNTER — Inpatient Hospital Stay: Payer: 59

## 2023-05-14 ENCOUNTER — Encounter
Admission: EM | Disposition: A | Discharge status: Skilled Nursing Facility | Payer: Self-pay | Source: Skilled Nursing Facility | Attending: Internal Medicine

## 2023-05-14 ENCOUNTER — Other Ambulatory Visit: Payer: Self-pay

## 2023-05-14 ENCOUNTER — Inpatient Hospital Stay: Payer: 59 | Admitting: Anesthesiology

## 2023-05-14 ENCOUNTER — Encounter: Payer: Self-pay | Admitting: Internal Medicine

## 2023-05-14 DIAGNOSIS — Z89432 Acquired absence of left foot: Secondary | ICD-10-CM

## 2023-05-14 DIAGNOSIS — M869 Osteomyelitis, unspecified: Secondary | ICD-10-CM | POA: Diagnosis not present

## 2023-05-14 DIAGNOSIS — M00062 Staphylococcal arthritis, left knee: Secondary | ICD-10-CM | POA: Diagnosis not present

## 2023-05-14 DIAGNOSIS — L03116 Cellulitis of left lower limb: Secondary | ICD-10-CM | POA: Diagnosis not present

## 2023-05-14 HISTORY — PX: IRRIGATION AND DEBRIDEMENT FOOT: SHX6602

## 2023-05-14 LAB — GLUCOSE, CAPILLARY
Glucose-Capillary: 269 mg/dL — ABNORMAL HIGH (ref 70–99)
Glucose-Capillary: 199 mg/dL — ABNORMAL HIGH (ref 70–99)
Glucose-Capillary: 175 mg/dL — ABNORMAL HIGH (ref 70–99)
Glucose-Capillary: 216 mg/dL — ABNORMAL HIGH (ref 70–99)
Glucose-Capillary: 115 mg/dL — ABNORMAL HIGH (ref 70–99)

## 2023-05-14 SURGERY — IRRIGATION AND DEBRIDEMENT FOOT
Anesthesia: General | Site: Foot | Laterality: Left

## 2023-05-14 MED ORDER — BUPIVACAINE HCL 0.5 % IJ SOLN
INTRAMUSCULAR | Status: DC | PRN
  Administered 2023-05-14: 15 mL

## 2023-05-14 MED ORDER — POVIDONE-IODINE 10 % EX SWAB: 2.0000 | Freq: Once | CUTANEOUS | Status: DC

## 2023-05-14 MED ORDER — PROPOFOL 1000 MG/100ML IV EMUL
INTRAVENOUS | Status: AC
  Filled 2023-05-14: qty 100

## 2023-05-14 MED ORDER — SODIUM CHLORIDE 0.9 % IV SOLN: INTRAVENOUS | Status: DC | PRN

## 2023-05-14 MED ORDER — LIDOCAINE HCL (PF) 1 % IJ SOLN
INTRAMUSCULAR | Status: AC
  Filled 2023-05-14: qty 30

## 2023-05-14 MED ORDER — VANCOMYCIN HCL 1000 MG IV SOLR
INTRAVENOUS | Status: AC
  Filled 2023-05-14: qty 20

## 2023-05-14 MED ORDER — BUPIVACAINE HCL (PF) 0.5 % IJ SOLN
INTRAMUSCULAR | Status: AC
Start: 2023-05-14 — End: ?
  Filled 2023-05-14: qty 30

## 2023-05-14 MED ORDER — PROPOFOL 10 MG/ML IV BOLUS
INTRAVENOUS | Status: DC | PRN
  Administered 2023-05-14: 20 mg via INTRAVENOUS

## 2023-05-14 MED ORDER — LIDOCAINE HCL 1 % IJ SOLN
INTRAMUSCULAR | Status: DC | PRN
  Administered 2023-05-14: 15 mL

## 2023-05-14 MED ORDER — FENTANYL CITRATE (PF) 100 MCG/2ML IJ SOLN
INTRAMUSCULAR | Status: DC | PRN
  Administered 2023-05-14 (×2): 50 ug via INTRAVENOUS

## 2023-05-14 MED ORDER — ONDANSETRON HCL 4 MG/2ML IJ SOLN
INTRAMUSCULAR | Status: DC | PRN
  Administered 2023-05-14: 4 mg via INTRAVENOUS

## 2023-05-14 MED ORDER — CHLORHEXIDINE GLUCONATE 4 % EX SOLN: 60.0000 mL | Freq: Once | CUTANEOUS | Status: DC

## 2023-05-14 MED ORDER — FENTANYL CITRATE (PF) 100 MCG/2ML IJ SOLN
INTRAMUSCULAR | Status: AC
  Filled 2023-05-14: qty 2

## 2023-05-14 MED ORDER — 0.9 % SODIUM CHLORIDE (POUR BTL) OPTIME
TOPICAL | Status: DC | PRN
  Administered 2023-05-14: 500 mL

## 2023-05-14 MED ORDER — ACETAMINOPHEN 10 MG/ML IV SOLN
INTRAVENOUS | Status: DC | PRN
  Administered 2023-05-14: 1000 mg via INTRAVENOUS

## 2023-05-14 MED ORDER — THROMBIN 5000 UNITS EX KIT
PACK | CUTANEOUS | Status: AC
  Filled 2023-05-14: qty 1

## 2023-05-14 MED ORDER — BUPIVACAINE HCL (PF) 0.25 % IJ SOLN
INTRAMUSCULAR | Status: AC
  Filled 2023-05-14: qty 30

## 2023-05-14 MED ORDER — LIDOCAINE-EPINEPHRINE 1 %-1:100000 IJ SOLN
INTRAMUSCULAR | Status: AC
  Filled 2023-05-14: qty 1

## 2023-05-14 MED ORDER — PROPOFOL 500 MG/50ML IV EMUL
INTRAVENOUS | Status: DC | PRN
Start: 2023-05-14 — End: 2023-05-14
  Administered 2023-05-14: 100 ug/kg/min via INTRAVENOUS

## 2023-05-14 SURGICAL SUPPLY — 62 items
BLADE OSC/SAGITTAL MD 5.5X18 (BLADE) IMPLANT
BLADE SURG 11 STRL SS (BLADE) IMPLANT
BLADE SURG 15 STRL LF DISP TIS (BLADE) ×2 IMPLANT
BLADE SW THK.38XMED LNG THN (BLADE) IMPLANT
BNDG COHESIVE 4X5 TAN STRL LF (GAUZE/BANDAGES/DRESSINGS) ×2 IMPLANT
BNDG COHESIVE 6X5 TAN ST LF (GAUZE/BANDAGES/DRESSINGS) ×2 IMPLANT
BNDG ELASTIC 4INX 5YD STR LF (GAUZE/BANDAGES/DRESSINGS) ×2 IMPLANT
BNDG ESMARCH 4X12 STRL LF (GAUZE/BANDAGES/DRESSINGS) ×2 IMPLANT
BNDG GAUZE DERMACEA FLUFF 4 (GAUZE/BANDAGES/DRESSINGS) ×2 IMPLANT
BNDG STRETCH GAUZE 3IN X12FT (GAUZE/BANDAGES/DRESSINGS) ×2 IMPLANT
CANISTER WOUND CARE 500ML ATS (WOUND CARE) ×2 IMPLANT
CNTNR URN SCR LID CUP LEK RST (MISCELLANEOUS) IMPLANT
CUFF TOURN SGL QUICK 12 (TOURNIQUET CUFF) IMPLANT
CUFF TOURN SGL QUICK 18X4 (TOURNIQUET CUFF) IMPLANT
DRAPE FLUOR MINI C-ARM 54X84 (DRAPES) IMPLANT
DRAPE XRAY CASSETTE 23X24 (DRAPES) IMPLANT
DRSG MEPILEX FLEX 3X3 (GAUZE/BANDAGES/DRESSINGS) IMPLANT
DURAPREP 26ML APPLICATOR (WOUND CARE) ×2 IMPLANT
ELECT REM PT RETURN 9FT ADLT (ELECTROSURGICAL) ×1
ELECTRODE REM PT RTRN 9FT ADLT (ELECTROSURGICAL) ×2 IMPLANT
GAUZE 4X4 16PLY ~~LOC~~+RFID DBL (SPONGE) IMPLANT
GAUZE PACKING 0.25INX5YD STRL (GAUZE/BANDAGES/DRESSINGS) ×2 IMPLANT
GAUZE SPONGE 4X4 12PLY STRL (GAUZE/BANDAGES/DRESSINGS) ×2 IMPLANT
GAUZE STRETCH 2X75IN STRL (MISCELLANEOUS) ×2 IMPLANT
GAUZE XEROFORM 1X8 LF (GAUZE/BANDAGES/DRESSINGS) ×2 IMPLANT
GLOVE BIO SURGEON STRL SZ7.5 (GLOVE) ×2 IMPLANT
GLOVE INDICATOR 8.0 STRL GRN (GLOVE) ×2 IMPLANT
GOWN STRL REUS W/ TWL LRG LVL3 (GOWN DISPOSABLE) ×2 IMPLANT
GOWN STRL REUS W/TWL MED LVL3 (GOWN DISPOSABLE) ×2 IMPLANT
GOWN STRL REUS W/TWL XL LVL4 (GOWN DISPOSABLE) ×2 IMPLANT
HANDPIECE VERSAJET DEBRIDEMENT (MISCELLANEOUS) IMPLANT
IV NS 1000ML BAXH (IV SOLUTION) ×2 IMPLANT
IV NS IRRIG 3000ML ARTHROMATIC (IV SOLUTION) ×2 IMPLANT
KIT DRSG VAC SLVR GRANUFM (MISCELLANEOUS) ×2 IMPLANT
KIT STIMULAN RAPID CURE 5CC (Orthopedic Implant) IMPLANT
KIT TURNOVER KIT A (KITS) ×2 IMPLANT
LABEL OR SOLS (LABEL) ×2 IMPLANT
MANIFOLD NEPTUNE II (INSTRUMENTS) ×2 IMPLANT
NDL FILTER BLUNT 18X1 1/2 (NEEDLE) ×2 IMPLANT
NDL HYPO 25X1 1.5 SAFETY (NEEDLE) ×2 IMPLANT
NEEDLE FILTER BLUNT 18X1 1/2 (NEEDLE) ×1 IMPLANT
NEEDLE HYPO 25X1 1.5 SAFETY (NEEDLE) ×1 IMPLANT
NS IRRIG 500ML POUR BTL (IV SOLUTION) ×2 IMPLANT
PACK EXTREMITY ARMC (MISCELLANEOUS) ×2 IMPLANT
PACKING GAUZE IODOFORM 1INX5YD (GAUZE/BANDAGES/DRESSINGS) ×2 IMPLANT
PAD ABD DERMACEA PRESS 5X9 (GAUZE/BANDAGES/DRESSINGS) ×2 IMPLANT
PULSAVAC PLUS IRRIG FAN TIP (DISPOSABLE) ×1
RASP SM TEAR CROSS CUT (RASP) IMPLANT
SHIELD FULL FACE ANTIFOG 7M (MISCELLANEOUS) ×2 IMPLANT
STOCKINETTE IMPERVIOUS 9X36 MD (GAUZE/BANDAGES/DRESSINGS) ×2 IMPLANT
SURGIFLO W/THROMBIN 8M KIT (HEMOSTASIS) IMPLANT
SUT ETHILON 2 0 FS 18 (SUTURE) ×4 IMPLANT
SUT ETHILON 4-0 FS2 18XMFL BLK (SUTURE) ×1
SUT VIC AB 3-0 SH 27X BRD (SUTURE) ×2 IMPLANT
SUT VIC AB 4-0 FS2 27 (SUTURE) ×2 IMPLANT
SUTURE ETHLN 4-0 FS2 18XMF BLK (SUTURE) ×2 IMPLANT
SWAB CULTURE AMIES ANAERIB BLU (MISCELLANEOUS) IMPLANT
SYR 10ML LL (SYRINGE) ×2 IMPLANT
SYR 3ML LL SCALE MARK (SYRINGE) ×2 IMPLANT
TIP FAN IRRIG PULSAVAC PLUS (DISPOSABLE) ×2 IMPLANT
TRAP FLUID SMOKE EVACUATOR (MISCELLANEOUS) ×2 IMPLANT
WATER STERILE IRR 500ML POUR (IV SOLUTION) ×2 IMPLANT

## 2023-05-14 NOTE — Progress Notes (Signed)
Progress Note   Patient: Miguel Harvey ZOX:[id number] DOB: [birth year] DOA: 05/10/2023     2 DOS: the patient was seen and examined on 05/14/2023     Brief hospital course: HPI: Miguel Harvey is a 78 y.o. male with medical history significant of HTN, HLD, DM, dCHF, COPD on 2L O2, CAD, depression, CKD-3A, PEA, NSVT, chronic left foot osteomyelitis, s/p right BKA, tobacco abuse, who presents to ED via [facility] from [facility] 05/10/23. Complaints left knee pain x3 days, constant, sharp, severe, nonradiating, aggravated by movement, notes swelling with warmth.  No fever or chills. He has chronic left foot ulcer without drainage. Facility has been treating for LLE cellulitis, got 2L NS at facility.    02/10: X-ray of left foot is negative for signs of osteomyelitis. XR knee no fx, (+)suprapatellar effusion, (+)tricompartmental arthritis.  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 Patient is on broad antibiotics as above Wound care on board I have consulted podiatrist and case discussed Podiatrist planning possible surgery today   Hyponatremia  Mental status normal.   Question fluid overload chronic HFpEF Continue fluid restriction therapy Sodium chloride tablet 1 g twice daily   Type II diabetes mellitus with hyperglycemia Recent A1c 9.8, patient is  taking NovoLog, glargine insulin 10 units daily Sliding scale insulin increased today  Continue glargine   COPD (chronic obstructive pulmonary disease)   Stable Continue bronchodilators and as needed Mucinex   HLD (hyperlipidemia) Continue Lipitor   HTN (hypertension) IV hydralazine as needed Coreg Lasix Hold lisinopril   CAD (coronary artery disease) No chest pain Resumed Plavix Continue aspirin, Lipitor   Chronic diastolic CHF (congestive heart failure)   2D echo on 05/25/2019 showed EF of 50-55% with grade 1 diastolic dysfunction.  No leg edema JVD.  CHF is compensated, BNP elevated  Continue Lasix   Chronic kidney disease, stage 3a Continue Lasix   TOBACCO DEPENDENCE Continue nicotine patch    Depression Continue home medication   overweight based on BMI: Body mass index is 28.43 kg/m.  Counseled on weight loss when medically stable     DVT prophylaxis: heparin     Code Status: FULL CODE   TOC needs: TBD, expect back to his long term care    Barriers to dispo / significant pending items: Awaiting culture results and clearance by orthopedic surgeon and ID   Family Communication: none at this time      Subjective:  Patient admits to improvement in left knee pain Denies nausea vomiting abdominal pain or chest pain Loss of appetite improved   Physical Exam: General: Not in acute distress Heme: No neck lymph node enlargement. Cardiac: S1/S2, RRR, No murmurs, No gallops or rubs. Respiratory: No rales, wheezing, rhonchi or rubs. GI: Soft, nondistended, nontender, no rebound pain, no organomegaly, BS present. GU: No hematuria Ext: No pitting leg edema bilaterally. 1+DP/PT pulse  bilaterally. S/p of right BKA.  Dressing on the left knee is clean and dry     Data Reviewed:    Latest Ref Rng & Units 05/13/2023    4:27 AM 05/12/2023    5:33 AM 05/11/2023    7:16 AM  CBC  WBC 4.0 - 10.5 K/uL 13.0  15.6  17.8   Hemoglobin 13.0 - 17.0 g/dL 9.6  16.1  09.6    Hematocrit 39.0 - 52.0 % 28.4  30.0  29.4   Platelets 150 - 400 K/uL 352  305  315        Latest Ref Rng & Units 05/13/2023    4:27 AM 05/12/2023    5:33 AM 05/11/2023    7:16 AM  BMP  Glucose 70 - 99 mg/dL 045  409  811   BUN 8 - 23 mg/dL 38  39  42   Creatinine 0.61 - 1.24 mg/dL 9.14  7.82  9.56   Sodium 135 - 145 mmol/L 131  130  122   Potassium 3.5 - 5.1 mmol/L 4.0  4.3  4.3   Chloride 98 - 111 mmol/L 100  97  89   CO2 22 - 32 mmol/L 21  23  21    Calcium 8.9 - 10.3 mg/dL 7.7  7.8  7.6      Vitals:   05/14/23 1415 05/14/23 1430 05/14/23 1444 05/14/23 1600  BP: 130/63 135/69 (!) 141/70 131/89  Pulse: 68 69 70 70  Resp: (!) 21 20 20 18   Temp:   (!) 97 F (36.1 C) 97.8 F (36.6 C)  TempSrc:      SpO2: 96% 97% 100% 100%  Weight:      Height:         Author: Loyce Dys, MD 05/14/2023 5:02 PM  For on call review [email or web address].

## 2023-05-14 NOTE — Plan of Care (Signed)

## 2023-05-14 NOTE — Progress Notes (Signed)
Subjective: 3 Days Post-Op Procedure(s) (LRB): ARTHROSCOPIC IRRIGATION AND DEBRIDEMENT LEFT KNEE, SYNOVECTOMY (Left) Patient reports pain as mild. Improving.  Patient is well, and has had no acute complaints or problems Denies any CP, SOB, ABD pain. We will continue therapy today.   Objective: Vital signs in last 24 hours: Temp:  [98.3 F (36.8 C)-98.9 F (37.2 C)] 98.9 F (37.2 C) (02/14 0728) Pulse Rate:  [80-83] 82 (02/14 0728) Resp:  [17-20] 20 (02/14 0728) BP: (140-146)/(71-78) 140/73 (02/14 0728) SpO2:  [96 %-100 %] 100 % (02/14 0728)  Intake/Output from previous day: No intake/output data recorded. Intake/Output this shift: No intake/output data recorded.  Recent Labs    05/12/23 0533 05/13/23 0427  HGB 10.1* 9.6*   Recent Labs    05/12/23 0533 05/13/23 0427  WBC 15.6* 13.0*  RBC 3.66* 3.40*  HCT 30.0* 28.4*  PLT 305 352   Recent Labs    05/12/23 0533 05/13/23 0427  NA 130* 131*  K 4.3 4.0  CL 97* 100  CO2 23 21*  BUN 39* 38*  CREATININE 1.21 1.21  GLUCOSE 228* 261*  CALCIUM 7.8* 7.7*   No results for input(s): "LABPT", "INR" in the last 72 hours.  EXAM General - Patient is Alert, Appropriate, and Oriented Extremity - Neurovascular intact Sensation intact distally Intact pulses distally Dorsiflexion/Plantar flexion intact Dressing - CDI, hemovac intact with out drainage.  Motor Function - intact, moving foot and toes well on exam.   Past Medical History:  Diagnosis Date   Abnormal posture    CHF (congestive heart failure) (HCC)    Chronic kidney disease    CKD   stage 2   Complication of anesthesia    heart stopped x2 during toe amputation   COPD (chronic obstructive pulmonary disease) (HCC)    Coronary artery disease, non-occlusive    a. LHC 05/2011: 10-20% ostial LAD stenosis   Depression    Diabetes mellitus    Grade I diastolic dysfunction    HLD (hyperlipidemia)    Hypertension    Muscle weakness    Neuromuscular disorder  (HCC)    neuropathy   NICM (nonischemic cardiomyopathy) (HCC)    a. TTE 2/213: EF 50-55%, mild LVH, normal wall motion, Gr2DD, mildly dilated LA; b. TTE 2/13: EF 40-45%, mild concentric LVH, global HK, no evidence of LV thrombus; c. TTE 4/19: EF 40%, diffuse HK, Gr2DD, mild MR, mildly dilated LA, RV cavity size normal w/ normal RVSF, PASP 63; d. TTE 4/19: EF 25-30%, diffuse HK, mild LVH, mild MR, mild PASP; e. TTE 5/19: EF 20-25%, diffuse HK, moderate MR,   NSVT (nonsustained ventricular tachycardia) (HCC)    PAD (peripheral artery disease) (HCC)    a. s/p right BKA s/p  recent balloon angioplasty fo the left peroneal artery, left posterior tibial artery, tibioperoneal trunk, and stenting to the distal left SFA in 07/2017   PEA (Pulseless electrical activity) (HCC)    heart stopped beating during surgery for amputation of toes    Assessment/Plan:   3 Days Post-Op Procedure(s) (LRB): ARTHROSCOPIC IRRIGATION AND DEBRIDEMENT LEFT KNEE, SYNOVECTOMY (Left) Principal Problem:   Left knee pain Active Problems:   TOBACCO DEPENDENCE   Foot osteomyelitis, left (HCC)   Type II diabetes mellitus with renal manifestations (HCC)   HLD (hyperlipidemia)   HTN (hypertension)   Chronic diastolic CHF (congestive heart failure) (HCC)   Chronic kidney disease, stage 3a (HCC)   COPD (chronic obstructive pulmonary disease) (HCC)   CAD (coronary artery disease)  Hyponatremia   Depression   Effusion of left knee   Pyogenic arthritis of left knee joint (HCC)   Cellulitis of left lower extremity  Estimated body mass index is 28.43 kg/m as calculated from the following:   Height as of this encounter: 6' (1.829 m).   Weight as of this encounter: 95.1 kg. Advance diet D/C IV fluids Continue with IV abx Per ID. Currently on Vanc and rocephin Labs and Vital signs stable Pain well controlled   DVT Prophylaxis - TED hose and SCDs  aspirin and plavix Weight-Bearing as tolerated to left leg   T. Cranston Neighbor, PA-C [facility] 05/14/2023, 9:11 AM

## 2023-05-14 NOTE — Op Note (Signed)
Operative note   Surgeon:Teighlor Korson [employer] logistics/support/administrative officer: None    Preop diagnosis: Left foot infection with osteomyelitis    Postop diagnosis: Same    Procedure: 1.  Partial excision of fourth metatarsal 2.  Partial excision of third metatarsal  3. Partial excision of second metatarsal 4.  Partial excision of first metatarsal 5.  Local rotational skin flap all left foot 6.  Placement of antibiotic impregnated calcium sulfate beads    EBL: 20 mL    Anesthesia:local and IV sedation.  Local consisted of a total of 30 cc of a one-to-one mixture of 0.5% plain bupivacaine and 1% lidocaine    Hemostasis: [place]calf tourniquet inflated to 200 mmHg for 22 minutes    Specimen: Fourth metatarsal for bone culture, tissue swab for culture, metatarsal with proximal margin inked for pathology    Complications: None    Operative indications:Miguel Harvey is an 78 y.o. that presents today for surgical intervention.  The risks/benefits/alternatives/complications have been discussed and consent has been given.    Procedure:  Patient was brought into the OR and placed on the operating table in thesupine position. After anesthesia was obtained theleft lower extremity was prepped and draped in usual sterile fashion.  Attention was directed to the distal aspect of the left foot where the large ulceration was noted under the fourth metatarsal.  A full-thickness incision was performed along the distal aspect of the metatarsals a linear in length.  A V excision of the ulcerative site was then performed at this time.  Full-thickness flaps were created both dorsal and plantar.  Dissection to the metatarsals carried back to about the mid metatarsal level.  The distal one half of the fourth, third, second, and first metatarsals were excised and removed from the surgical field in toto.  The fourth metatarsal was inked for the proximal margin.  Sample of the fourth metatarsal was sent for bone culture and a tissue swab was  performed as well.  At this time all areas were flushed with copious amounts of irrigation.  The tourniquet was dropped.  Bleeders were Bovie cauterized.  At this time the vancomycin impregnated and beads were then infiltrated along the surgical site.  Multiple beads were placed in the shaft of the fourth metatarsal.  After final evaluation and cauterization of bleeders closure was then performed.  The V flap was then reanastomosed and rotated to for primary closure.  Layered closure of the local rotational flap was performed with a combination of 2-0 Vicryl and 3-0 nylon.  The distal incision was closed with a 2-0 Vicryl and 3-0 nylon.  A bulky sterile dressing was applied to all areas.    Patient tolerated the procedure and anesthesia well.  Was transported from the OR to the PACU with all vital signs stable and vascular status intact. To be discharged per routine protocol.  Will be readmitted to the floor for continued evaluation.

## 2023-05-14 NOTE — Progress Notes (Signed)
Date of Admission:  05/10/2023      ID: Miguel Harvey is a 78 y.o. male  Principal Problem:   Left knee pain Active Problems:   TOBACCO DEPENDENCE   Foot osteomyelitis, left (HCC)   Type II diabetes mellitus with renal manifestations (HCC)   HLD (hyperlipidemia)   HTN (hypertension)   Chronic diastolic CHF (congestive heart failure) (HCC)   Chronic kidney disease, stage 3a (HCC)   COPD (chronic obstructive pulmonary disease) (HCC)   CAD (coronary artery disease)   Hyponatremia   Depression   Effusion of left knee   Pyogenic arthritis of left knee joint (HCC)   Cellulitis of left lower extremity    Subjective: Pt was taken to OR and underwent partial excision of 1 2,3,4 metatarsal and creation of rotational flap  Medications:   aspirin EC  81 mg Oral Daily   atorvastatin  20 mg Oral QPM   carvedilol  6.25 mg Oral BID WC   clopidogrel  75 mg Oral Daily   docusate sodium  100 mg Oral BID   feeding supplement  237 mL Oral BID BM   furosemide  20 mg Oral Daily   heparin  5,000 Units Subcutaneous Q8H   insulin aspart  0-5 Units Subcutaneous QHS   insulin aspart  0-9 Units Subcutaneous TID WC   insulin aspart  5 Units Subcutaneous TID WC   insulin glargine-yfgn  10 Units Subcutaneous Daily   latanoprost  1 drop Both Eyes QHS   multivitamin with minerals  1 tablet Oral Daily   mupirocin cream   Topical Daily   nicotine  21 mg Transdermal Daily   polyvinyl alcohol  1 drop Both Eyes QID   sertraline  50 mg Oral Daily   sodium chloride  1 g Oral BID WC   vitamin B-12  100 mcg Oral Daily    Objective: Vital signs in last 24 hours: Patient Vitals for the past 24 hrs:  BP Temp Temp src Pulse Resp SpO2 Height Weight  05/14/23 1444 (!) 141/70 (!) 97 F (36.1 C) -- 70 20 100 % -- --  05/14/23 1430 135/69 -- -- 69 20 97 % -- --  05/14/23 1415 130/63 -- -- 68 (!) 21 96 % -- --  05/14/23 1403 121/63 (!) 97.4 F (36.3 C) -- 72 18 95 % -- --  05/14/23 1158 136/79 98.6 F (37  C) Temporal 78 18 97 % 6' (1.829 m) 95.1 kg  05/14/23 0728 (!) 140/73 98.9 F (37.2 C) -- 82 20 100 % -- --  05/14/23 0458 (!) 141/71 98.3 F (36.8 C) Oral 83 17 96 % -- --  05/13/23 1747 (!) 146/78 98.5 F (36.9 C) -- 80 19 97 % -- --     PHYSICAL EXAM:  General: sleeping  Lungs: Clear to auscultation bilaterally. No Wheezing or Rhonchi. No rales. Heart: Regular rate and rhythm, no murmur, rub or gallop. Abdomen: Soft, non-tender,not distended. Bowel sounds normal. No masses Extremities: left knee dressing  Left foot surgical dressing Skin: No rashes or lesions. Or bruising Lymph: Cervical, supraclavicular normal. Neurologic: did not assess  Lab Results    Latest Ref Rng & Units 05/13/2023    4:27 AM 05/12/2023    5:33 AM 05/11/2023    7:16 AM  CBC  WBC 4.0 - 10.5 K/uL 13.0  15.6  17.8   Hemoglobin 13.0 - 17.0 g/dL 9.6  16.1  09.6   Hematocrit 39.0 - 52.0 % 28.4  30.0  29.4   Platelets 150 - 400 K/uL 352  305  315        Latest Ref Rng & Units 05/13/2023    4:27 AM 05/12/2023    5:33 AM 05/11/2023    7:16 AM  CMP  Glucose 70 - 99 mg/dL 914  782  956   BUN 8 - 23 mg/dL 38  39  42   Creatinine 0.61 - 1.24 mg/dL 2.13  0.86  5.78   Sodium 135 - 145 mmol/L 131  130  122   Potassium 3.5 - 5.1 mmol/L 4.0  4.3  4.3   Chloride 98 - 111 mmol/L 100  97  89   CO2 22 - 32 mmol/L 21  23  21    Calcium 8.9 - 10.3 mg/dL 7.7  7.8  7.6       Microbiology: [facility]- NG 2/11- knee culture finalized as neg but there were rare gram positive cocci seen- will ask to lab to keep the plate for 14 d  Studies/Results: DG MINI C-ARM IMAGE ONLY Result Date: 05/14/2023 There is no interpretation for this exam.  This order is for images obtained during a surgical procedure.  Please See "Surgeries" Tab for more information regarding the procedure.   MR FOOT LEFT WO CONTRAST Result Date: 05/14/2023 CLINICAL DATA:  Diabetes.  Foot swelling.  Osteomyelitis suspected. EXAM: MRI OF THE LEFT FOOT WITHOUT  CONTRAST TECHNIQUE: Multiplanar, multisequence MR imaging of the left forefoot was performed. No intravenous contrast was administered. COMPARISON:  left foot radiographs 05/10/2023, MRI left forefoot 05/09/2021 FINDINGS: Bones/Joint/Cartilage Adjacent to the soft tissue ulcer described below, there is high-grade decreased T1 and increased T2 signal marrow edema within the distal 2.3 cm of the remaining postsurgical fourth metatarsal. There is erosion at the distal plantar aspect of the fourth metatarsal stump (axial series 3 images 10 through 13). There is also serpiginous, heterogeneous T1 and T2 signal extending throughout the postsurgical fourth metatarsal shaft of the proximal fourth metatarsal metaphysis suggesting a bone infarct. Moderate talonavicular, mild navicular-cuneiform, and moderate first through fifth tarsometatarsal osteoarthritis including cartilage thinning and peripheral osteophytes. Ligaments The Lisfranc ligament complex is intact. Muscles and Tendons There is again moderate to high-grade edema throughout the plantar and dorsal musculature, nonspecific denervation changes versus myositis. There is edema throughout the visualized distal flexor hallucis longus tendon. Soft tissues There is an ulcer at the plantar lateral foot, plantar to the distal fourth metatarsal shaft (sagittal series 7, image 25 and axial series 4, image 12) measuring up to approximately 11 mm in transverse dimension (axial series 4, image 12) and 16 mm in long axis foot dimension (sagittal series 10, image 25). There is mild-to-moderate subcutaneous fat edema and swelling throughout the dorsal midfoot and the hindfoot. IMPRESSION: 1. Ulcer at the plantar lateral foot, plantar to the distal fourth metatarsal shaft. Adjacent to the ulcer, there is cortical erosion indicating osteomyelitis with associated marrow edema throughout the distal 2.3 cm of the remaining postsurgical fourth metatarsal. 2. Additional bone infarct  extending throughout the postsurgical fourth metatarsal shaft and proximal fourth metatarsal metaphysis. 3. Moderate to high-grade edema throughout the plantar and dorsal musculature, nonspecific denervation changes versus myositis. Electronically Signed   By: Neita Garnet M.D.   On: 05/14/2023 08:56     Assessment/Plan: Left knee septic arthritis s/p I.D on vanco and ceftriaxone-  Synovial fluid culture was finalized as no growth- asked them to keep it for a longer period No OR culture sent   Left  foot TMA and there is a chronic discharging wound.on the lateral aspect which could be the soruce- podiatry seen the patient and he underwent partial excision of 1,2,3 and 4 meta tarsal- cultures sent and pathology sent      Anemia   PAD- rt BKA   CKD improving   DM   CAD managemet as per primary team   Discussed with care team  ID will not routinely see him this weekend On call ID available by phone if needed

## 2023-05-14 NOTE — Progress Notes (Signed)
PT Cancellation Note  Patient Details Name: Miguel Harvey MRN: [id number] DOB: 01-18-1946   Cancelled Treatment:    Reason Eval/Treat Not Completed: Other (comment). Patient out of room at this time, PT to re-attempt as able.    Olga Coaster PT, DPT 12:31 PM,05/14/23

## 2023-05-14 NOTE — Consult Note (Signed)
Pharmacy Antibiotic Note Miguel Harvey is a 78 y.o. male admitted on 05/10/2023 with  generalized weakness .  Patient looks to have LLE cellulitis, and left septic knee (PMH R BKA and L TMA).Pharmacy has been consulted for Vancomycin dosing.   05/14/2023 Day #4 vancomycin and ceftriaxone Renal: SCr 1.21 2/13 (looks to be at baseline) WBC 13 Afebrile 2/10 blood cx: NGTD 2/11 knee aspiration 193k WBC (99% neutrophils) Synovial fluid culture: NGTD (gram stain WBC, rare GPC) 2/14 MRI L foot ordered, podiatry taking to OR  Vancomycin levels: Dose - vancomycin 1500mg  IV q24h, dose given 2/14 at ____ Vancomycin peak 2/15 at ____ Vancomycin random 2/15  Plan: Continue vancomycin 1500mg  IV q24h  Estimated AUC:436 (goal 400-600) Scr used 1.21 Monitor renal function  Plan to check vancomycin levels with tonight's dose Await knee aspiration culture (do not see OR culture) Await long-term antibiotic plan for antibiotics   Height: 6' (182.9 cm) Weight: 95.1 kg (209 lb 10.5 oz) IBW/kg (Calculated) : 77.6  Temp (24hrs), Avg:98.6 F (37 C), Min:98.3 F (36.8 C), Max:98.9 F (37.2 C)  Recent Labs  Lab 05/10/23 1502 05/10/23 2026 05/11/23 0716 05/12/23 0533 05/13/23 0427  WBC 13.7*  --  17.8* 15.6* 13.0*  CREATININE 1.58*  --  1.38* 1.21 1.21  LATICACIDVEN  --  1.5  --   --   --     Estimated Creatinine Clearance: 60.2 mL/min (by C-G formula based on SCr of 1.21 mg/dL).    Allergies  Allergen Reactions   Spironolactone     Hyperkalemia (currently takes spironolactone)    Antimicrobials this admission: Vancomycin 2/11 >>  Ceftriaxone 2/11 >>   Thank you for allowing pharmacy to be a part of this patient's care.  Juliette Alcide, PharmD, BCPS, BCIDP Work Cell: [phone] 05/14/2023 8:37 AM

## 2023-05-14 NOTE — Anesthesia Procedure Notes (Signed)
Procedure Name: General with mask airway Date/Time: 05/14/2023 12:45 PM  Performed by: Cheral Bay, CRNAPre-anesthesia Checklist: Patient identified, Emergency Drugs available, Suction available and Patient being monitored Patient Re-evaluated:Patient Re-evaluated prior to induction Oxygen Delivery Method: Circle system utilized Preoxygenation: Pre-oxygenation with 100% oxygen Induction Type: IV induction Ventilation: Mask ventilation without difficulty LMA: LMA inserted LMA Size: 4.0 Tube type: Oral Number of attempts: 1 Placement Confirmation: positive ETCO2 and breath sounds checked- equal and bilateral Tube secured with: Tape Dental Injury: Teeth and Oropharynx as per pre-operative assessment

## 2023-05-14 NOTE — Anesthesia Preprocedure Evaluation (Addendum)
Anesthesia Evaluation  Patient identified by MRN, date of birth, ID band Patient awake    Reviewed: Allergy & Precautions, H&P , NPO status , Patient's Chart, lab work & pertinent test results  History of Anesthesia Complications (+) history of anesthetic complications  Airway Mallampati: III  TM Distance: >3 FB Neck ROM: Full    Dental no notable dental hx. (+) Edentulous Upper, Edentulous Lower, Dental Advidsory Given   Pulmonary COPD, Current Smoker and Patient abstained from smoking.   Pulmonary exam normal breath sounds clear to auscultation       Cardiovascular hypertension, + CAD, + Peripheral Vascular Disease and +CHF  Normal cardiovascular exam Rhythm:Regular Rate:Normal  05-25-19    1. Left ventricular ejection fraction, by estimation, is 50 to 55%. The  left ventricle has low normal function. The left ventricle has no regional  wall motion abnormalities. There is moderate left ventricular hypertrophy.  Left ventricular diastolic  parameters are consistent with Grade I diastolic dysfunction (impaired  relaxation).   2. Right ventricular systolic function is normal. The right ventricular  size is normal. Tricuspid regurgitation signal is inadequate for assessing  PA pressure.  PAD s/p right BKA s/p balloon angioplasty to the left peroneal artery, left posterior tibial artery, tibioperoneal trunk, and stenting of the distal left SFA in 07/2017 Nonobstructive coronary artery disease,  cardiomyopathy ejection fraction 25% on echocardiogram,  smoker hyperlipidemia, diabetes, hypertension, presenting to the hospital with diabetic foot ulcer/infection, Has prior below-knee amputation right lower extremity one month ago in [place] Long-standing ulceration left foot with no healing Does not ambulate much at baseline Started on Zosyn and vancomycin, Was being prepped for debridement left foot with Dr. Ether Griffins PEA  arrest Heart stopped x 2 during preparation for toe amptation surgery, per notes from Epic   Neuro/Psych  PSYCHIATRIC DISORDERS  Depression     Neuromuscular disease negative neurological ROS  negative psych ROS   GI/Hepatic negative GI ROS, Neg liver ROS,,,  Endo/Other  diabetes    Renal/GU Renal diseasenegative Renal ROS  negative genitourinary   Musculoskeletal negative musculoskeletal ROS (+)    Abdominal   Peds negative pediatric ROS (+)  Hematology negative hematology ROS (+)   Anesthesia Other Findings Past Medical History: No date: Abnormal posture No date: CHF (congestive heart failure) (HCC) No date: Chronic kidney disease     Comment:  CKD   stage 2 No date: Complication of anesthesia     Comment:  heart stopped x2 during toe amputation No date: COPD (chronic obstructive pulmonary disease) (HCC) No date: Coronary artery disease, non-occlusive     Comment:  a. LHC 05/2011: 10-20% ostial LAD stenosis No date: Depression No date: Diabetes mellitus No date: Grade I diastolic dysfunction No date: HLD (hyperlipidemia) No date: Hypertension No date: Muscle weakness No date: Neuromuscular disorder (HCC)     Comment:  neuropathy No date: NICM (nonischemic cardiomyopathy) (HCC)     Comment:  a. TTE 2/213: EF 50-55%, mild LVH, normal wall motion,               Gr2DD, mildly dilated LA; b. TTE 2/13: EF 40-45%, mild               concentric LVH, global HK, no evidence of LV thrombus; c.              TTE 4/19: EF 40%, diffuse HK, Gr2DD, mild MR, mildly               dilated LA, RV  cavity size normal w/ normal RVSF, PASP               63; d. TTE 4/19: EF 25-30%, diffuse HK, mild LVH, mild               MR, mild PASP; e. TTE 5/19: EF 20-25%, diffuse HK,               moderate MR, No date: NSVT (nonsustained ventricular tachycardia) (HCC) No date: PAD (peripheral artery disease) (HCC)     Comment:  a. s/p right BKA s/p  recent balloon angioplasty fo the                left peroneal artery, left posterior tibial artery,               tibioperoneal trunk, and stenting to the distal left SFA               in 07/2017 No date: PEA (Pulseless electrical activity) (HCC)     Comment:  heart stopped beating during surgery for amputation of               toes  Past Surgical History: 07/02/2017: AMPUTATION; Right     Comment:  Procedure: RIGHT BELOW KNEE AMPUTATION;  Surgeon: Nadara Mustard, MD;  Location: [facility] OR;  Service: Orthopedics;                Laterality: Right; 05/11/2021: AMPUTATION TOE; Left     Comment:  Procedure: LEFT GREAT  TOE AMPUTATION;  Surgeon: Gwyneth Revels, DPM;  Location: [facility] ORS;  Service: Podiatry;                Laterality: Left; No date: CARDIAC CATHETERIZATION 11/08/2018: CATARACT EXTRACTION W/PHACO; Left     Comment:  Procedure: CATARACT EXTRACTION PHACO AND INTRAOCULAR               LENS PLACEMENT (IOC)  LEFT VISION BLUE;  Surgeon: Elliot Cousin, MD;  Location: [facility] ORS;  Service: Ophthalmology;               Laterality: Left;  [place] 03:38.9 CDE 42.37 Fluid pack lot               # [id number] 03/25/2023: CATARACT EXTRACTION W/PHACO; Right     Comment:  Procedure: CATARACT EXTRACTION PHACO AND INTRAOCULAR               LENS PLACEMENT (IOC) RIGHT DIABETIC MALYUGIN OMIDRIA               24.82 02:08.6;  Surgeon: Estanislado Pandy, MD;                Location: [facility];  Service: Ophthalmology;                Laterality: Right; No date: cyst removal from  right leg about 6 years ago 08/10/2017: IRRIGATION AND DEBRIDEMENT FOOT; Left     Comment:  Procedure: IRRIGATION AND DEBRIDEMENT FOOT;  Surgeon:               Gwyneth Revels, DPM;  Location: [facility] ORS;  Service:               Podiatry;  Laterality: Left; 05/11/2011: LEFT  HEART CATHETERIZATION WITH CORONARY ANGIOGRAM; N/A     Comment:  Procedure: LEFT HEART CATHETERIZATION WITH CORONARY               ANGIOGRAM;  Surgeon: Lennette Bihari, MD;  Location: [facility]               CATH LAB;  Service: Cardiovascular;  Laterality: N/A; 08/13/2017: LOWER EXTREMITY ANGIOGRAPHY; Left     Comment:  Procedure: Lower Extremity Angiography and PICC line               placement;  Surgeon: Annice Needy, MD;  Location: [facility]               INVASIVE CV LAB;  Service: Cardiovascular;  Laterality:               Left; No date: ruptured navel     Comment:  repair of No date: TOE AMPUTATION     Comment:  not sure of when this surgery occured, but his heart               stopped beating x 2 during surgery  BMI    Body Mass Index: 28.43 kg/m      Reproductive/Obstetrics negative OB ROS                             Anesthesia Physical Anesthesia Plan  ASA: 4  Anesthesia Plan: General   Post-op Pain Management:    Induction: Intravenous  PONV Risk Score and Plan: 2 and Ondansetron  Airway Management Planned: Natural Airway  Additional Equipment:   Intra-op Plan:   Post-operative Plan:   Informed Consent: I have reviewed the patients History and Physical, chart, labs and discussed the procedure including the risks, benefits and alternatives for the proposed anesthesia with the patient or authorized representative who has indicated his/her understanding and acceptance.     Dental Advisory Given  Plan Discussed with: Anesthesiologist, CRNA and Surgeon  Anesthesia Plan Comments:         Anesthesia Quick Evaluation

## 2023-05-14 NOTE — Transfer of Care (Signed)
Immediate Anesthesia Transfer of Care Note  Patient: Miguel Harvey  Procedure(s) Performed: IRRIGATION AND DEBRIDEMENT FOOT (Left: Foot)  Patient Location: PACU  Anesthesia Type:General  Level of Consciousness: awake and drowsy  Airway & Oxygen Therapy: Patient Spontanous Breathing  Post-op Assessment: Report given to RN and Post -op Vital signs reviewed and stable  Post vital signs: Reviewed and stable  Last Vitals:  Vitals Value Taken Time  BP 121/63 05/14/23 1403  Temp 36.3 C 05/14/23 1403  Pulse 69 05/14/23 1406  Resp 16 05/14/23 1406  SpO2 98 % 05/14/23 1406  Vitals shown include unfiled device data.  Last Pain:  Vitals:   05/14/23 1158  TempSrc: Temporal  PainSc: 0-No pain      Patients Stated Pain Goal: 0 (05/14/23 1158)  Complications: No notable events documented.

## 2023-05-14 NOTE — Plan of Care (Signed)
Problem: Education: Goal: Ability to describe self-care measures that may prevent or decrease complications (Diabetes Survival Skills Education) will improve Outcome: Not Progressing Goal: Individualized Educational Video(s) Outcome: Not Progressing   Problem: Coping: Goal: Ability to adjust to condition or change in health will improve Outcome: Not Progressing   Problem: Fluid Volume: Goal: Ability to maintain a balanced intake and output will improve Outcome: Not Progressing   Problem: Health Behavior/Discharge Planning: Goal: Ability to identify and utilize available resources and services will improve Outcome: Not Progressing Goal: Ability to manage health-related needs will improve Outcome: Not Progressing   Problem: Metabolic: Goal: Ability to maintain appropriate glucose levels will improve Outcome: Not Progressing   Problem: Nutritional: Goal: Maintenance of adequate nutrition will improve Outcome: Not Progressing Goal: Progress toward achieving an optimal weight will improve Outcome: Not Progressing   Problem: Skin Integrity: Goal: Risk for impaired skin integrity will decrease Outcome: Not Progressing   Problem: Tissue Perfusion: Goal: Adequacy of tissue perfusion will improve Outcome: Not Progressing   Problem: Education: Goal: Knowledge of General Education information will improve Description: Including pain rating scale, medication(s)/side effects and non-pharmacologic comfort measures Outcome: Not Progressing   Problem: Health Behavior/Discharge Planning: Goal: Ability to manage health-related needs will improve Outcome: Not Progressing   Problem: Clinical Measurements: Goal: Ability to maintain clinical measurements within normal limits will improve Outcome: Not Progressing Goal: Will remain free from infection Outcome: Not Progressing Goal: Diagnostic test results will improve Outcome: Not Progressing Goal: Respiratory complications will  improve Outcome: Not Progressing Goal: Cardiovascular complication will be avoided Outcome: Not Progressing   Problem: Activity: Goal: Risk for activity intolerance will decrease Outcome: Not Progressing   Problem: Nutrition: Goal: Adequate nutrition will be maintained Outcome: Not Progressing   Problem: Coping: Goal: Level of anxiety will decrease Outcome: Not Progressing   Problem: Elimination: Goal: Will not experience complications related to bowel motility Outcome: Not Progressing Goal: Will not experience complications related to urinary retention Outcome: Not Progressing   Problem: Pain Managment: Goal: General experience of comfort will improve and/or be controlled Outcome: Not Progressing   Problem: Safety: Goal: Ability to remain free from injury will improve Outcome: Not Progressing   Problem: Skin Integrity: Goal: Risk for impaired skin integrity will decrease Outcome: Not Progressing   Problem: Clinical Measurements: Goal: Ability to avoid or minimize complications of infection will improve Outcome: Not Progressing   Problem: Skin Integrity: Goal: Skin integrity will improve Outcome: Not Progressing   Problem: Education: Goal: Knowledge of General Education information will improve Description: Including pain rating scale, medication(s)/side effects and non-pharmacologic comfort measures Outcome: Not Progressing   Problem: Health Behavior/Discharge Planning: Goal: Ability to manage health-related needs will improve Outcome: Not Progressing   Problem: Clinical Measurements: Goal: Ability to maintain clinical measurements within normal limits will improve Outcome: Not Progressing Goal: Will remain free from infection Outcome: Not Progressing Goal: Diagnostic test results will improve Outcome: Not Progressing Goal: Respiratory complications will improve Outcome: Not Progressing Goal: Cardiovascular complication will be avoided Outcome: Not  Progressing   Problem: Activity: Goal: Risk for activity intolerance will decrease Outcome: Not Progressing   Problem: Nutrition: Goal: Adequate nutrition will be maintained Outcome: Not Progressing   Problem: Coping: Goal: Level of anxiety will decrease Outcome: Not Progressing   Problem: Elimination: Goal: Will not experience complications related to bowel motility Outcome: Not Progressing Goal: Will not experience complications related to urinary retention Outcome: Not Progressing   Problem: Pain Managment: Goal: General experience of comfort will improve and/or  be controlled Outcome: Not Progressing   Problem: Safety: Goal: Ability to remain free from injury will improve Outcome: Not Progressing   Problem: Skin Integrity: Goal: Risk for impaired skin integrity will decrease Outcome: Not Progressing   Problem: Education: Goal: Verbalization of understanding the information provided (i.e., activity precautions, restrictions, etc) will improve Outcome: Not Progressing Goal: Individualized Educational Video(s) Outcome: Not Progressing

## 2023-05-15 DIAGNOSIS — L03116 Cellulitis of left lower limb: Secondary | ICD-10-CM | POA: Diagnosis not present

## 2023-05-15 LAB — CBC
HCT: 27.1 % — ABNORMAL LOW (ref 39.0–52.0)
Hemoglobin: 9 g/dL — ABNORMAL LOW (ref 13.0–17.0)
MCH: 28 pg (ref 26.0–34.0)
MCHC: 33.2 g/dL (ref 30.0–36.0)
MCV: 84.4 fL (ref 80.0–100.0)
Platelets: 390 10*3/uL (ref 150–400)
RBC: 3.21 MIL/uL — ABNORMAL LOW (ref 4.22–5.81)
RDW: 13.6 % (ref 11.5–15.5)
WBC: 14.6 10*3/uL — ABNORMAL HIGH (ref 4.0–10.5)
nRBC: 0 % (ref 0.0–0.2)

## 2023-05-15 LAB — GLUCOSE, CAPILLARY
Glucose-Capillary: 140 mg/dL — ABNORMAL HIGH (ref 70–99)
Glucose-Capillary: 206 mg/dL — ABNORMAL HIGH (ref 70–99)
Glucose-Capillary: 183 mg/dL — ABNORMAL HIGH (ref 70–99)
Glucose-Capillary: 264 mg/dL — ABNORMAL HIGH (ref 70–99)

## 2023-05-15 LAB — VANCOMYCIN, RANDOM: Vancomycin Rm: 16 ug/mL

## 2023-05-15 LAB — VANCOMYCIN, PEAK: Vancomycin Pk: 31 ug/mL (ref 30–40)

## 2023-05-15 LAB — CREATININE, SERUM
Creatinine, Ser: 0.98 mg/dL (ref 0.61–1.24)
GFR, Estimated: >60 mL/min (ref >60)

## 2023-05-15 NOTE — Plan of Care (Signed)

## 2023-05-15 NOTE — Progress Notes (Signed)
Daily Progress Note   Subjective  - 1 Day Post-Op  Follow-up left foot debridement status post 1 day.  No complaints of the foot.  Still complaining of knee pain.  Objective Vitals:   05/14/23 1600 05/14/23 2009 05/15/23 0243 05/15/23 0751  BP: 131/89 (!) 140/94 (!) 151/80 (!) 144/80  Pulse: 70 74 79 80  Resp: 18 18 17 18   Temp: 97.8 F (36.6 C) 98 F (36.7 C) 98.7 F (37.1 C) 98.2 F (36.8 C)  TempSrc:  Oral Oral   SpO2: 100% 98% 98% 99%  Weight:      Height:        Physical Exam: Left foot incision is well coapted.  Just a scant amount of mild maceration to the lateral aspect of the incision site with bloody drainage.  No erythema to the foot.  No overt purulent drainage noted today.  Good perfusion to the skin flaps.  No necrosis.       Bone culture shows no growth after 12 hours.  Tissue culture shows gram-positive cocci.  Laboratory CBC    Component Value Date/Time   WBC 13.0 (H) 05/13/2023 0427   HGB 9.6 (L) 05/13/2023 0427   HGB 11.6 (L) 11/07/2019 1439   HCT 28.4 (L) 05/13/2023 0427   HCT 35.7 (L) 11/07/2019 1439   PLT 352 05/13/2023 0427   PLT 288 11/07/2019 1439    BMET    Component Value Date/Time   NA 131 (L) 05/13/2023 0427   NA 141 11/07/2019 1439   K 4.0 05/13/2023 0427   CL 100 05/13/2023 0427   CO2 21 (L) 05/13/2023 0427   GLUCOSE 261 (H) 05/13/2023 0427   BUN 38 (H) 05/13/2023 0427   BUN 14 11/07/2019 1439   CREATININE 0.98 05/15/2023 0029   CREATININE 1.43 (H) 09/21/2012 1150   CALCIUM 7.7 (L) 05/13/2023 0427   GFRNONAA >60 05/15/2023 0029   GFRAA 73 11/07/2019 1439    Assessment/Planning: Osteomyelitis status postdebridement left foot  New dressing applied.  Recommend continued nonweightbearing to the left foot.   Will write for dressing changes to be performed every 2 to 3 days. Will monitor more peripherally at this time.  Continue to monitor wound cultures.  Appreciate infectious disease assistance. Gwyneth Revels  A  05/15/2023, 11:40 AM

## 2023-05-15 NOTE — Progress Notes (Addendum)
Subjective: 1 Day Post-Op Procedure(s) (LRB): IRRIGATION AND DEBRIDEMENT FOOT (Left) and knee (left) Patient reports pain as mild. Improving.  Patient is well, and has had no acute complaints or problems Denies any CP, SOB, ABD pain. We will continue therapy today. Encouraged to attempt ambulating if tolerated with PT or nursing   Objective: Vital signs in last 24 hours: Temp:  [97 F (36.1 C)-98.7 F (37.1 C)] 98.2 F (36.8 C) (02/15 0751) Pulse Rate:  [68-80] 80 (02/15 0751) Resp:  [17-21] 18 (02/15 0751) BP: (121-151)/(63-94) 144/80 (02/15 0751) SpO2:  [95 %-100 %] 99 % (02/15 0751) Weight:  [95.1 kg] 95.1 kg (02/14 1158)  Intake/Output from previous day: 02/14 0701 - 02/15 0700 In: 500 [I.V.:400; IV Piggyback:100] Out: 615 [Urine:615] Intake/Output this shift: Total I/O In: 0  Out: 650 [Urine:650]  Recent Labs    05/13/23 0427  HGB 9.6*   Recent Labs    05/13/23 0427  WBC 13.0*  RBC 3.40*  HCT 28.4*  PLT 352   Recent Labs    05/13/23 0427 05/15/23 0029  NA 131*  --   K 4.0  --   CL 100  --   CO2 21*  --   BUN 38*  --   CREATININE 1.21 0.98  GLUCOSE 261*  --   CALCIUM 7.7*  --    No results for input(s): "LABPT", "INR" in the last 72 hours.  EXAM General - Patient is Alert, Appropriate, and Oriented Extremity - Neurovascular intact Sensation intact distally Intact pulses distally Dorsiflexion/Plantar flexion intact Increased discomfort with attempted flexing or extending of knee, patient holds leg externally rotated and 25 degrees flexed Dressing - CDI, no drainage, honeycombs intact Motor Function - intact, moving foot and ankle well on exam. Patient has bandage and ACE wrap covering left foot from I&D of left foot. Nuerovascularly intact to all dermatomes  Crackles appreciated on auscultation of lungs and audible with patient talking/coughing  Past Medical History:  Diagnosis Date   Abnormal posture    CHF (congestive heart failure)  (HCC)    Chronic kidney disease    CKD   stage 2   Complication of anesthesia    heart stopped x2 during toe amputation   COPD (chronic obstructive pulmonary disease) (HCC)    Coronary artery disease, non-occlusive    a. LHC 05/2011: 10-20% ostial LAD stenosis   Depression    Diabetes mellitus    Grade I diastolic dysfunction    HLD (hyperlipidemia)    Hypertension    Muscle weakness    Neuromuscular disorder (HCC)    neuropathy   NICM (nonischemic cardiomyopathy) (HCC)    a. TTE 2/213: EF 50-55%, mild LVH, normal wall motion, Gr2DD, mildly dilated LA; b. TTE 2/13: EF 40-45%, mild concentric LVH, global HK, no evidence of LV thrombus; c. TTE 4/19: EF 40%, diffuse HK, Gr2DD, mild MR, mildly dilated LA, RV cavity size normal w/ normal RVSF, PASP 63; d. TTE 4/19: EF 25-30%, diffuse HK, mild LVH, mild MR, mild PASP; e. TTE 5/19: EF 20-25%, diffuse HK, moderate MR,   NSVT (nonsustained ventricular tachycardia) (HCC)    PAD (peripheral artery disease) (HCC)    a. s/p right BKA s/p  recent balloon angioplasty fo the left peroneal artery, left posterior tibial artery, tibioperoneal trunk, and stenting to the distal left SFA in 07/2017   PEA (Pulseless electrical activity) (HCC)    heart stopped beating during surgery for amputation of toes    Assessment/Plan:  1 Day Post-Op Procedure(s) (LRB): IRRIGATION AND DEBRIDEMENT FOOT (Left) and knee (left) Principal Problem:   Left knee pain Active Problems:   TOBACCO DEPENDENCE   Foot osteomyelitis, left (HCC)   Type II diabetes mellitus with renal manifestations (HCC)   HLD (hyperlipidemia)   HTN (hypertension)   Chronic diastolic CHF (congestive heart failure) (HCC)   Chronic kidney disease, stage 3a (HCC)   COPD (chronic obstructive pulmonary disease) (HCC)   CAD (coronary artery disease)   Hyponatremia   Depression   Effusion of left knee   Pyogenic arthritis of left knee joint (HCC)   Cellulitis of left lower extremity  Estimated  body mass index is 28.43 kg/m as calculated from the following:   Height as of this encounter: 6' (1.829 m).   Weight as of this encounter: 95.1 kg. Advance diet D/C IV fluids Cultures still pending, rare gram positive cocci Continue with IV abx Per ID. Currently on Vanc and rocephin Labs and Vital signs stable, redraw of CBC ordered Pain well controlled at baseline but noted to increase with PT or motion  Provided with and encouraged to use incentive spirometer to help prevent postoperative pnuemonia  DVT Prophylaxis - TED hose and SCDs  aspirin and plavix Weight-Bearing as tolerated to left leg  Danise Edge, PA-C [facility] 05/15/2023, 11:42 AM

## 2023-05-15 NOTE — Consult Note (Signed)
Pharmacy Antibiotic Note GRABIEL Harvey is a 78 y.o. male admitted on 05/10/2023 with  generalized weakness . Patient looks to have LLE cellulitis, and left septic knee (PMH R BKA and L TMA). Pharmacy has been consulted for Vancomycin dosing.   05/15/2023 Day #5 vancomycin and ceftriaxone Renal: SCr 0.98 2/15 (looks to be at baseline) WBC 14.6 Afebrile 2/10 blood cx: NGT 2/11 knee aspiration 193k WBC (99% neutrophils) Synovial fluid culture: NGTD (gram stain WBC, rare GPC)  Vancomycin levels: Dose - vancomycin 1500mg  IV q24h, dose given 2/14 at 2105 Vancomycin peak 2/15 at 0028: 31 Vancomycin random 2/15 at 1551: 16 Plan: Continue vancomycin 1500mg  IV q24h  Calculated AUC: 519.6 (goal 400-600) Calculated Cmin: 12.8 Monitor renal function  Await knee aspiration culture (do not see OR culture) Await long-term antibiotic plan for antibiotics   Height: 6' (182.9 cm) Weight: 95.1 kg (209 lb 10.5 oz) IBW/kg (Calculated) : 77.6  Temp (24hrs), Avg:98.3 F (36.8 C), Min:98 F (36.7 C), Max:98.7 F (37.1 C)  Recent Labs  Lab 05/10/23 1502 05/10/23 2026 05/11/23 0716 05/12/23 0533 05/13/23 0427 05/15/23 0028 05/15/23 0029 05/15/23 1551  WBC 13.7*  --  17.8* 15.6* 13.0*  --   --  14.6*  CREATININE 1.58*  --  1.38* 1.21 1.21  --  0.98  --   LATICACIDVEN  --  1.5  --   --   --   --   --   --   VANCOPEAK  --   --   --   --   --  31  --   --   VANCORANDOM  --   --   --   --   --   --   --  16    Estimated Creatinine Clearance: 74.3 mL/min (by C-G formula based on SCr of 0.98 mg/dL).    Allergies  Allergen Reactions   Spironolactone     Hyperkalemia (currently takes spironolactone)    Antimicrobials this admission: Vancomycin 2/11 >>  Ceftriaxone 2/11 >>   Thank you for allowing pharmacy to be a part of this patient's care.  Celene Squibb, PharmD Clinical Pharmacist 05/15/2023 4:30 PM

## 2023-05-15 NOTE — Progress Notes (Signed)
Progress Note   Patient: Miguel Harvey BJY:[id number] DOB: Oct 26, 1945 DOA: 05/10/2023     3 DOS: the patient was seen and examined on 05/15/2023      Brief hospital course: HPI: Miguel Harvey is a 78 y.o. male with medical history significant of HTN, HLD, DM, dCHF, COPD on 2L O2, CAD, depression, CKD-3A, PEA, NSVT, chronic left foot osteomyelitis, s/p right BKA, tobacco abuse, who presents to ED via [facility]/10/[facility]. Complaints left knee pain x3 days, constant, sharp, severe, nonradiating, aggravated by movement, notes swelling with warmth.  No fever or chills. He has chronic left foot ulcer without drainage. Facility has been treating for LLE cellulitis, got 2L NS at facility.    02/10: X-ray of left foot is negative for signs of osteomyelitis. XR knee no fx, (+)suprapatellar effusion, (+)tricompartmental arthritis.  02/11: ortho consulted, concern for septic joint based on aspiration results, to OR     Consultants:  Orthopedic surgery, infectious disease, podiatry   Procedures/Surgeries: 05/11/23 arthroscopic washout L knee - Dr Audelia Acton      ASSESSMENT & PLAN:   Left knee pain d/t septic arthritis  Status post aspiration and washout by orthopedic surgeon on 05/11/23  I have discussed plan of care with surgeon as well as infectious disease Continue as needed Percocet, Tylenol for pain   Left foot osteomyelitis Likely osteomyelitis left fourth metatarsal Status post partial transmetatarsal amputation left foot Diabetes with neuropathy this is a chronic issue, the wound looks like scabbed, no active drainage. Patient is on broad antibiotics as above Plan of care discussed with podiatrist S/p debridement by podiatrist on 05/14/2023 Follow-up on culture results   Hyponatremia-improved Mental status normal.   Question fluid overload chronic HFpEF Continue fluid restriction therapy Continue sodium chloride tablet 1 g twice daily   Type II diabetes  mellitus with hyperglycemia Recent A1c 9.8, patient is taking NovoLog, glargine insulin 10 units daily Sliding scale insulin increased today  Continue glargine   COPD (chronic obstructive pulmonary disease)   Stable Continue bronchodilators and as needed Mucinex   HLD (hyperlipidemia) Continue Lipitor   HTN (hypertension) IV hydralazine as needed Coreg Lasix Hold lisinopril   CAD (coronary artery disease) No chest pain Resumed Plavix Continue aspirin, Lipitor   Chronic diastolic CHF (congestive heart failure)   2D echo on 05/25/2019 showed EF of 50-55% with grade 1 diastolic dysfunction.  No leg edema JVD.  CHF is compensated, BNP elevated  Continue Lasix   Chronic kidney disease, stage 3a Continue Lasix   TOBACCO DEPENDENCE Continue nicotine patch    Depression Continue home medication   overweight based on BMI: Body mass index is 28.43 kg/m.  Counseled on weight loss when medically stable     DVT prophylaxis: heparin     Code Status: FULL CODE   TOC needs: TBD, expect back to his long term care    Barriers to dispo / significant pending items: Awaiting culture results and clearance by orthopedic surgeon and ID   Family Communication: none at this time      Subjective:  Patient seen and examined at bedside this morning Admits to improvement in foot pain Denies nausea vomiting abdominal pain   Physical Exam: General: Not in acute distress Heme: No neck lymph node enlargement. Cardiac: S1/S2, RRR, No murmurs, No gallops or rubs. Respiratory: No rales, wheezing, rhonchi or rubs. GI: Soft, nondistended, nontender, no rebound pain, no organomegaly, BS present. GU: No hematuria Ext: No pitting leg edema bilaterally.  1+DP/PT pulse bilaterally. S/p of right BKA.  Dressing on the left knee is clean and dry     Data Reviewed:     Latest Ref Rng & Units 05/15/2023    3:51 PM 05/13/2023    4:27 AM 05/12/2023    5:33 AM  CBC  WBC 4.0 - 10.5 K/uL 14.6  13.0   15.6   Hemoglobin 13.0 - 17.0 g/dL 9.0  9.6  78.2   Hematocrit 39.0 - 52.0 % 27.1  28.4  30.0   Platelets 150 - 400 K/uL 390  352  305        Latest Ref Rng & Units 05/15/2023   12:29 AM 05/13/2023    4:27 AM 05/12/2023    5:33 AM  BMP  Glucose 70 - 99 mg/dL  956  213   BUN 8 - 23 mg/dL  38  39   Creatinine 0.86 - 1.24 mg/dL 5.78  4.69  6.29   Sodium 135 - 145 mmol/L  131  130   Potassium 3.5 - 5.1 mmol/L  4.0  4.3   Chloride 98 - 111 mmol/L  100  97   CO2 22 - 32 mmol/L  21  23   Calcium 8.9 - 10.3 mg/dL  7.7  7.8     Vitals:   05/14/23 1600 05/14/23 2009 05/15/23 0243 05/15/23 0751  BP: 131/89 (!) 140/94 (!) 151/80 (!) 144/80  Pulse: 70 74 79 80  Resp: 18 18 17 18   Temp: 97.8 F (36.6 C) 98 F (36.7 C) 98.7 F (37.1 C) 98.2 F (36.8 C)  TempSrc:  Oral Oral   SpO2: 100% 98% 98% 99%  Weight:      Height:         Author: Loyce Dys, MD 05/15/2023 4:40 PM  For on call review [email or web address].

## 2023-05-15 NOTE — Progress Notes (Signed)
Physical Therapy Treatment Patient Details Name: Miguel Harvey MRN: [id number] DOB: 1945-04-03 Today's Date: 05/15/2023   History of Present Illness Pt is a 78 y.o. male with medical history significant of HTN, HLD, DM, dCHF, COPD on 2L O2, CAD, depression, CKD-3A, PEA, NSVT, chronic left foot osteomyelitis, s/p of right BKA, tobacco abuse, who presents with left knee pain.  MD assessment includes: hyponatremia and L knee pain with septic arthritis with pt now s/p arthroscopic irrigation debridement and partial synovectomy.    PT Comments  Pt in bed, premedicated for session.  Participates in RLE aarom.  Refuesed attempts at LLE hip AA/PROM due to pain.  Holds LLE in knee flexion and externally rotated at hip.  Educated of importance of keeping LE in a more neutral position to prevent contractures and allow for improved mobility as pain decreases but he refused attempts to reposition.  He does participate in pulling himself up in bed using headboard and bed tilted for gravity assist.     If plan is discharge home, recommend the following: Two people to help with walking and/or transfers;A lot of help with bathing/dressing/bathroom;Direct supervision/assist for medications management;Assist for transportation   Can travel by private vehicle        Equipment Recommendations  None recommended by PT    Recommendations for Other Services       Precautions / Restrictions Precautions Precautions: Fall Restrictions Weight Bearing Restrictions Per Provider Order: Yes LLE Weight Bearing Per Provider Order: Weight bearing as tolerated     Mobility  Bed Mobility Overal bed mobility: Needs Assistance             General bed mobility comments: does pull up on headboard with bed in trendelenburg postion to pull up in bed but refused other mobility    Transfers                   General transfer comment: Pt declined to attempt secondary to L knee pain    Ambulation/Gait                    Stairs             Wheelchair Mobility     Tilt Bed    Modified Rankin (Stroke Patients Only)       Balance                                            Communication    Cognition Arousal: Alert Behavior During Therapy: WFL for tasks assessed/performed   PT - Cognitive impairments: No apparent impairments                                Cueing    Exercises Other Exercises Other Exercises: RLE AAROM x 10,  attemtped LLE but he refused all attempts due to pain.  attemtped to reposition but again refused.    General Comments        Pertinent Vitals/Pain Pain Assessment Pain Assessment: Faces Faces Pain Scale: Hurts whole lot Pain Location: L knee, general body with attempts at moving/repositioning Pain Descriptors / Indicators: Sore, Aching Pain Intervention(s): Limited activity within patient's tolerance, Monitored during session, Repositioned, Premedicated before session    Home Living  Prior Function            PT Goals (current goals can now be found in the care plan section) Progress towards PT goals: Not progressing toward goals - comment    Frequency    Min 1X/week      PT Plan      Co-evaluation              AM-PAC PT "6 Clicks" Mobility   Outcome Measure  Help needed turning from your back to your side while in a flat bed without using bedrails?: Total Help needed moving from lying on your back to sitting on the side of a flat bed without using bedrails?: Total Help needed moving to and from a bed to a chair (including a wheelchair)?: Total Help needed standing up from a chair using your arms (e.g., wheelchair or bedside chair)?: Total Help needed to walk in hospital room?: Total Help needed climbing 3-5 steps with a railing? : Total 6 Click Score: 6    End of Session   Activity Tolerance: Patient limited by pain Patient left: in bed;with  call bell/phone within reach;with bed alarm set Nurse Communication: Mobility status PT Visit Diagnosis: Muscle weakness (generalized) (M62.81);Pain Pain - Right/Left: Left Pain - part of body: Knee     Time: 6045-4098 PT Time Calculation (min) (ACUTE ONLY): 8 min  Charges:    $Therapeutic Exercise: 8-22 mins PT General Charges $$ ACUTE PT VISIT: 1 Visit                   Danielle Dess, PTA 05/15/23, 9:48 AM

## 2023-05-16 DIAGNOSIS — L03116 Cellulitis of left lower limb: Secondary | ICD-10-CM | POA: Diagnosis not present

## 2023-05-16 LAB — CBC WITH DIFFERENTIAL/PLATELET
Abs Immature Granulocytes: 0.21 10*3/uL — ABNORMAL HIGH (ref 0.00–0.07)
Basophils Absolute: 0 10*3/uL (ref 0.0–0.1)
Basophils Relative: 0 %
Eosinophils Absolute: 0 10*3/uL (ref 0.0–0.5)
Eosinophils Relative: 0 %
HCT: 25.7 % — ABNORMAL LOW (ref 39.0–52.0)
Hemoglobin: 8.6 g/dL — ABNORMAL LOW (ref 13.0–17.0)
Immature Granulocytes: 2 %
Lymphocytes Relative: 14 %
Lymphs Abs: 2 10*3/uL (ref 0.7–4.0)
MCH: 27.7 pg (ref 26.0–34.0)
MCHC: 33.5 g/dL (ref 30.0–36.0)
MCV: 82.9 fL (ref 80.0–100.0)
Monocytes Absolute: 1.3 10*3/uL — ABNORMAL HIGH (ref 0.1–1.0)
Monocytes Relative: 9 %
Neutro Abs: 10.8 10*3/uL — ABNORMAL HIGH (ref 1.7–7.7)
Neutrophils Relative %: 75 %
Platelets: 411 10*3/uL — ABNORMAL HIGH (ref 150–400)
RBC: 3.1 MIL/uL — ABNORMAL LOW (ref 4.22–5.81)
RDW: 13.7 % (ref 11.5–15.5)
WBC: 14.4 10*3/uL — ABNORMAL HIGH (ref 4.0–10.5)
nRBC: 0 % (ref 0.0–0.2)

## 2023-05-16 LAB — BASIC METABOLIC PANEL
Anion gap: 9 (ref 5–15)
BUN: 20 mg/dL (ref 8–23)
CO2: 22 mmol/L (ref 22–32)
Calcium: 8 mg/dL — ABNORMAL LOW (ref 8.9–10.3)
Chloride: 101 mmol/L (ref 98–111)
Creatinine, Ser: 0.94 mg/dL (ref 0.61–1.24)
GFR, Estimated: >60 mL/min (ref >60)
Glucose, Bld: 197 mg/dL — ABNORMAL HIGH (ref 70–99)
Potassium: 4.1 mmol/L (ref 3.5–5.1)
Sodium: 132 mmol/L — ABNORMAL LOW (ref 135–145)

## 2023-05-16 LAB — GLUCOSE, CAPILLARY
Glucose-Capillary: 194 mg/dL — ABNORMAL HIGH (ref 70–99)
Glucose-Capillary: 297 mg/dL — ABNORMAL HIGH (ref 70–99)
Glucose-Capillary: 252 mg/dL — ABNORMAL HIGH (ref 70–99)
Glucose-Capillary: 152 mg/dL — ABNORMAL HIGH (ref 70–99)

## 2023-05-16 LAB — CULTURE, BLOOD (ROUTINE X 2)
Culture: NO GROWTH
Culture: NO GROWTH
Special Requests: ADEQUATE

## 2023-05-16 NOTE — Progress Notes (Signed)
Subjective: 2 Days Post-Op Procedure(s) (LRB): IRRIGATION AND DEBRIDEMENT FOOT (Left) and knee (left) Patient reports pain as mild. Improving, states not as intense as yesterday.  Patient is well, and has had no acute complaints or problems Denies any CP, SOB, ABD pain. We will continue therapy today. Encouraged to attempt standing/ambulating if tolerated with PT or nursing   Objective: Vital signs in last 24 hours: Temp:  [97.4 F (36.3 C)-98.8 F (37.1 C)] 98.8 F (37.1 C) (02/16 0925) Pulse Rate:  [75-87] 87 (02/16 0925) Resp:  [18] 18 (02/16 0925) BP: (133-146)/(66-90) 146/90 (02/16 0925) SpO2:  [98 %-99 %] 99 % (02/16 0925)  Intake/Output from previous day: 02/15 0701 - 02/16 0700 In: 0  Out: 1300 [Urine:1300] Intake/Output this shift: No intake/output data recorded.  Recent Labs    05/15/23 1551 05/16/23 0511  HGB 9.0* 8.6*   Recent Labs    05/15/23 1551 05/16/23 0511  WBC 14.6* 14.4*  RBC 3.21* 3.10*  HCT 27.1* 25.7*  PLT 390 411*   Recent Labs    05/15/23 0029 05/16/23 0511  NA  --  132*  K  --  4.1  CL  --  101  CO2  --  22  BUN  --  20  CREATININE 0.98 0.94  GLUCOSE  --  197*  CALCIUM  --  8.0*   No results for input(s): "LABPT", "INR" in the last 72 hours.  EXAM General - Patient is Alert, Appropriate, and Oriented Extremity - Neurovascular intact Sensation intact distally Intact pulses distally Dorsiflexion/Plantar flexion intact Increased discomfort with attempted flexing or extending of knee, patient holds leg externally rotated and 20 degrees flexed Dressing - CDI, no drainage, honeycombs intact Motor Function - intact, moving foot and ankle well on exam.   Of note, patient has bandage and ACE wrap that was being changed by a wound care nurse during provider visit. Mild blood covering left foot from I&D of left foot. Nuerovascularly intact to all dermatomes  Improved symptoms and less crackling auscultated at lung bases, continued  to encourage incentive spirometer.   Past Medical History:  Diagnosis Date   Abnormal posture    CHF (congestive heart failure) (HCC)    Chronic kidney disease    CKD   stage 2   Complication of anesthesia    heart stopped x2 during toe amputation   COPD (chronic obstructive pulmonary disease) (HCC)    Coronary artery disease, non-occlusive    a. LHC 05/2011: 10-20% ostial LAD stenosis   Depression    Diabetes mellitus    Grade I diastolic dysfunction    HLD (hyperlipidemia)    Hypertension    Muscle weakness    Neuromuscular disorder (HCC)    neuropathy   NICM (nonischemic cardiomyopathy) (HCC)    a. TTE 2/213: EF 50-55%, mild LVH, normal wall motion, Gr2DD, mildly dilated LA; b. TTE 2/13: EF 40-45%, mild concentric LVH, global HK, no evidence of LV thrombus; c. TTE 4/19: EF 40%, diffuse HK, Gr2DD, mild MR, mildly dilated LA, RV cavity size normal w/ normal RVSF, PASP 63; d. TTE 4/19: EF 25-30%, diffuse HK, mild LVH, mild MR, mild PASP; e. TTE 5/19: EF 20-25%, diffuse HK, moderate MR,   NSVT (nonsustained ventricular tachycardia) (HCC)    PAD (peripheral artery disease) (HCC)    a. s/p right BKA s/p  recent balloon angioplasty fo the left peroneal artery, left posterior tibial artery, tibioperoneal trunk, and stenting to the distal left SFA in 07/2017  PEA (Pulseless electrical activity) (HCC)    heart stopped beating during surgery for amputation of toes    Assessment/Plan:   2 Days Post-Op Procedure(s) (LRB): IRRIGATION AND DEBRIDEMENT FOOT (Left) and knee (left) Principal Problem:   Left knee pain Active Problems:   TOBACCO DEPENDENCE   Foot osteomyelitis, left (HCC)   Type II diabetes mellitus with renal manifestations (HCC)   HLD (hyperlipidemia)   HTN (hypertension)   Chronic diastolic CHF (congestive heart failure) (HCC)   Chronic kidney disease, stage 3a (HCC)   COPD (chronic obstructive pulmonary disease) (HCC)   CAD (coronary artery disease)   Hyponatremia    Depression   Effusion of left knee   Pyogenic arthritis of left knee joint (HCC)   Cellulitis of left lower extremity  Estimated body mass index is 28.43 kg/m as calculated from the following:   Height as of this encounter: 6' (1.829 m).   Weight as of this encounter: 95.1 kg. Advance diet D/C IV fluids Cultures still pending, rare gram positive cocci seen Continue with IV abx Per ID. Currently on Vanc and rocephin Labs show elevated WBC at 14.4, hbg dropped to 8.6. Vital signs stable Pain well controlled at baseline but noted to increase with PT or motion  Provided with and encouraged to use incentive spirometer to help prevent postoperative pnuemonia  DVT Prophylaxis - TED hose and SCDs , aspirin and plavix Weight-Bearing as tolerated to left leg for knee, see podiatry consult for recommendations for left foot  Danise Edge, PA-C [facility] 05/16/2023, 10:12 AM

## 2023-05-16 NOTE — Progress Notes (Signed)
Progress Note   Patient: Miguel Harvey GEX:[id number] DOB: 1945-09-14 DOA: 05/10/2023     4 DOS: the patient was seen and examined on 05/16/2023     Brief hospital course: HPI: Miguel Harvey is a 78 y.o. male with medical history significant of HTN, HLD, DM, dCHF, COPD on 2L O2, CAD, depression, CKD-3A, PEA, NSVT, chronic left foot osteomyelitis, s/p right BKA, tobacco abuse, who presents to ED via [facility] from [facility] 05/10/23. Complaints left knee pain x3 days, constant, sharp, severe, nonradiating, aggravated by movement, notes swelling with warmth.  No fever or chills. He has chronic left foot ulcer without drainage. Facility has been treating for LLE cellulitis, got 2L NS at facility.    02/10: X-ray of left foot is negative for signs of osteomyelitis. XR knee no fx, (+)suprapatellar effusion, (+)tricompartmental arthritis.  02/11: ortho consulted, concern for septic joint based on aspiration results, to OR     Consultants:  Orthopedic surgery, infectious disease, podiatry   Procedures/Surgeries: 05/11/23 arthroscopic washout L knee - Dr Audelia Acton      ASSESSMENT & PLAN:   Left knee pain d/t septic arthritis  Status post aspiration and washout by orthopedic surgeon on 05/11/23  I have discussed plan of care with surgeon as well as infectious disease Continue as needed Percocet, Tylenol for pain   Left foot osteomyelitis Likely osteomyelitis left fourth metatarsal Status post partial transmetatarsal amputation left foot Diabetes with neuropathy this is a chronic issue, the wound looks like scabbed, no active drainage. Patient is on broad antibiotics as above Plan of care discussed with podiatrist S/p debridement by podiatrist on 05/14/2023 Continue to follow-up on culture results   Hyponatremia-improved Mental status normal.   Question fluid overload chronic HFpEF Continue fluid restriction therapy Continue sodium chloride tablet 1 g twice daily   Type II  diabetes mellitus with hyperglycemia Recent A1c 9.8, patient is taking NovoLog, glargine insulin 10 units daily Sliding scale insulin increased today  Continue glargine   COPD (chronic obstructive pulmonary disease)   Stable Continue bronchodilators and as needed Mucinex   HLD (hyperlipidemia) Continue Lipitor   HTN (hypertension) IV hydralazine as needed Coreg Lasix Hold lisinopril   CAD (coronary artery disease) No chest pain Continue Plavix Continue aspirin, Lipitor   Chronic diastolic CHF (congestive heart failure)   2D echo on 05/25/2019 showed EF of 50-55% with grade 1 diastolic dysfunction.  No leg edema JVD.  CHF is compensated, BNP elevated  Continue Lasix   Chronic kidney disease, stage 3a Continue Lasix   TOBACCO DEPENDENCE Continue nicotine patch    Depression Continue home medication   overweight based on BMI: Body mass index is 28.43 kg/m.  Counseled on weight loss when medically stable     DVT prophylaxis: heparin     Code Status: FULL CODE   TOC needs: TBD, expect back to his long term care    Barriers to dispo / significant pending items: Awaiting culture results and clearance by orthopedic surgeon and ID   Family Communication: none at this time      Subjective:  Patient seen and examined at bedside this morning Admits to improvement in foot pain Denies nausea vomiting abdominal pain   Physical Exam: General: Not in acute distress Heme: No neck lymph node enlargement. Cardiac: S1/S2, RRR, No murmurs, No gallops or rubs. Respiratory: No rales, wheezing, rhonchi or rubs. GI: Soft, nondistended, nontender, no rebound pain, no organomegaly, BS present. GU: No hematuria Ext: No pitting leg edema  bilaterally. 1+DP/PT pulse bilaterally. S/p of right BKA.  Dressing on the left knee is clean and dry     Data Reviewed:    Vitals:   05/15/23 1644 05/16/23 0103 05/16/23 0529 05/16/23 0925  BP: 133/66 134/72 (!) 143/73 (!) 146/90  Pulse:  80 75 78 87  Resp: 18 18 18 18   Temp: 98 F (36.7 C) (!) 97.4 F (36.3 C) 98.4 F (36.9 C) 98.8 F (37.1 C)  TempSrc:      SpO2: 99% 98% 99% 99%  Weight:      Height:          Latest Ref Rng & Units 05/16/2023    5:11 AM 05/15/2023    3:51 PM 05/13/2023    4:27 AM  CBC  WBC 4.0 - 10.5 K/uL 14.4  14.6  13.0   Hemoglobin 13.0 - 17.0 g/dL 8.6  9.0  9.6   Hematocrit 39.0 - 52.0 % 25.7  27.1  28.4   Platelets 150 - 400 K/uL 411  390  352        Latest Ref Rng & Units 05/16/2023    5:11 AM 05/15/2023   12:29 AM 05/13/2023    4:27 AM  BMP  Glucose 70 - 99 mg/dL 696   295   BUN 8 - 23 mg/dL 20   38   Creatinine 2.84 - 1.24 mg/dL 1.32  4.40  1.02   Sodium 135 - 145 mmol/L 132   131   Potassium 3.5 - 5.1 mmol/L 4.1   4.0   Chloride 98 - 111 mmol/L 101   100   CO2 22 - 32 mmol/L 22   21   Calcium 8.9 - 10.3 mg/dL 8.0   7.7      Author: Loyce Dys, MD 05/16/2023 3:50 PM  For on call review [email or web address].

## 2023-05-16 NOTE — Plan of Care (Addendum)
Patient is alert and oriented X 4.  Dressing done as order. Pt has a strong, non productive cough, gave a medicine for cough as well as encouraged pt to use incentive spirometry. Plan of care ongoing.    Problem: Education: Goal: Ability to describe self-care measures that may prevent or decrease complications (Diabetes Survival Skills Education) will improve Outcome: Progressing Goal: Individualized Educational Video(s) Outcome: Progressing   Problem: Coping: Goal: Ability to adjust to condition or change in health will improve Outcome: Progressing   Problem: Fluid Volume: Goal: Ability to maintain a balanced intake and output will improve Outcome: Progressing   Problem: Health Behavior/Discharge Planning: Goal: Ability to identify and utilize available resources and services will improve Outcome: Progressing Goal: Ability to manage health-related needs will improve Outcome: Progressing

## 2023-05-16 NOTE — Plan of Care (Signed)

## 2023-05-17 ENCOUNTER — Encounter: Payer: Self-pay | Admitting: Podiatry

## 2023-05-17 DIAGNOSIS — L03116 Cellulitis of left lower limb: Secondary | ICD-10-CM | POA: Diagnosis not present

## 2023-05-17 LAB — BASIC METABOLIC PANEL
Anion gap: 9 (ref 5–15)
BUN: 17 mg/dL (ref 8–23)
CO2: 21 mmol/L — ABNORMAL LOW (ref 22–32)
Calcium: 8 mg/dL — ABNORMAL LOW (ref 8.9–10.3)
Chloride: 101 mmol/L (ref 98–111)
Creatinine, Ser: 0.9 mg/dL (ref 0.61–1.24)
GFR, Estimated: >60 mL/min (ref >60)
Glucose, Bld: 191 mg/dL — ABNORMAL HIGH (ref 70–99)
Potassium: 4.1 mmol/L (ref 3.5–5.1)
Sodium: 131 mmol/L — ABNORMAL LOW (ref 135–145)

## 2023-05-17 LAB — CBC WITH DIFFERENTIAL/PLATELET
Abs Immature Granulocytes: 0.18 10*3/uL — ABNORMAL HIGH (ref 0.00–0.07)
Basophils Absolute: 0 10*3/uL (ref 0.0–0.1)
Basophils Relative: 0 %
Eosinophils Absolute: 0 10*3/uL (ref 0.0–0.5)
Eosinophils Relative: 0 %
HCT: 26.5 % — ABNORMAL LOW (ref 39.0–52.0)
Hemoglobin: 8.8 g/dL — ABNORMAL LOW (ref 13.0–17.0)
Immature Granulocytes: 1 %
Lymphocytes Relative: 14 %
Lymphs Abs: 1.8 10*3/uL (ref 0.7–4.0)
MCH: 27.7 pg (ref 26.0–34.0)
MCHC: 33.2 g/dL (ref 30.0–36.0)
MCV: 83.3 fL (ref 80.0–100.0)
Monocytes Absolute: 1 10*3/uL (ref 0.1–1.0)
Monocytes Relative: 8 %
Neutro Abs: 9.8 10*3/uL — ABNORMAL HIGH (ref 1.7–7.7)
Neutrophils Relative %: 77 %
Platelets: 411 10*3/uL — ABNORMAL HIGH (ref 150–400)
RBC: 3.18 MIL/uL — ABNORMAL LOW (ref 4.22–5.81)
RDW: 13.6 % (ref 11.5–15.5)
WBC: 12.9 10*3/uL — ABNORMAL HIGH (ref 4.0–10.5)
nRBC: 0 % (ref 0.0–0.2)

## 2023-05-17 LAB — GLUCOSE, CAPILLARY
Glucose-Capillary: 199 mg/dL — ABNORMAL HIGH (ref 70–99)
Glucose-Capillary: 261 mg/dL — ABNORMAL HIGH (ref 70–99)

## 2023-05-17 MED ORDER — CEFADROXIL 500 MG PO CAPS: 1000.0000 mg | ORAL_CAPSULE | Freq: Two times a day (BID) | ORAL | 0 refills | Status: AC

## 2023-05-17 MED ORDER — HYDROCODONE-ACETAMINOPHEN 7.5-325 MG PO TABS: 1.0000 | ORAL_TABLET | Freq: Four times a day (QID) | ORAL | 0 refills | Status: AC | PRN

## 2023-05-17 MED ORDER — SODIUM CHLORIDE 1 G PO TABS: 1.0000 g | ORAL_TABLET | Freq: Two times a day (BID) | ORAL | 0 refills | Status: AC

## 2023-05-17 MED ORDER — CEFADROXIL 500 MG PO CAPS
1000.0000 mg | ORAL_CAPSULE | Freq: Two times a day (BID) | ORAL | Status: DC
  Filled 2023-05-17: qty 2

## 2023-05-17 NOTE — TOC Progression Note (Signed)
Transition of Care [facility]) - Progression Note    Patient Details  Name: Miguel Harvey MRN: [id number] Date of Birth: [birth year]  Transition of [facility]) CM/SW Contact  Allena Katz, LCSW Phone Number: 05/17/2023, 1:25 PM  Clinical Narrative:   CSW messaged a message for sarah at [facility] house to see when they can assess patient for return.    Expected Discharge Plan: Assisted Living    Expected Discharge Plan and Services       Living arrangements for the past 2 months: Assisted Living Facility                                       Social Determinants of Health (SDOH) Interventions SDOH Screenings   Food Insecurity: No Food Insecurity (05/11/2023)  Housing: Unknown (05/11/2023)  Transportation Needs: No Transportation Needs (05/11/2023)  Utilities: Not At Risk (05/11/2023)  Physical Activity: Inactive (08/05/2017)  Social Connections: Socially Isolated (05/11/2023)  Stress: No Stress Concern Present (08/05/2017)  Tobacco Use: High Risk (05/14/2023)    Readmission Risk Interventions     No data to display

## 2023-05-17 NOTE — Care Management Important Message (Signed)
Important Message  Patient Details  Name: Miguel Harvey MRN: [id number] Date of Birth: 11-Oct-1945   Important Message Given:  Yes - Medicare IM     Cristela Blue, CMA 05/17/2023, 10:12 AM

## 2023-05-17 NOTE — Progress Notes (Signed)
Date of Admission:  05/10/2023      ID: Miguel Harvey is a 78 y.o. male  Principal Problem:   Left knee pain Active Problems:   TOBACCO DEPENDENCE   Foot osteomyelitis, left (HCC)   Type II diabetes mellitus with renal manifestations (HCC)   HLD (hyperlipidemia)   HTN (hypertension)   Chronic diastolic CHF (congestive heart failure) (HCC)   Chronic kidney disease, stage 3a (HCC)   COPD (chronic obstructive pulmonary disease) (HCC)   CAD (coronary artery disease)   Hyponatremia   Depression   Effusion of left knee   Pyogenic arthritis of left knee joint (HCC)   Cellulitis of left lower extremity  I called [facility] and got his medication list- HE was getting keflex 2/7 and then ceftriaxone 2/8 beofre he came in on 2/10  SuObjective: Vital signs in last 24 hours: Patient Vitals for the past 24 hrs:  BP Temp Temp src Pulse Resp SpO2  05/17/23 0815 (!) 147/57 98.4 F (36.9 C) -- 80 16 97 %  05/17/23 0609 (!) 146/72 97.9 F (36.6 C) Oral 77 18 98 %  05/16/23 2202 131/64 99.1 F (37.3 C) Oral 75 16 98 %  05/16/23 1845 (!) 147/71 98.4 F (36.9 C) -- 83 14 100 %     PHYSICAL EXAM:  Picture reviewed      Lab Results    Latest Ref Rng & Units 05/17/2023    6:28 AM 05/16/2023    5:11 AM 05/15/2023    3:51 PM  CBC  WBC 4.0 - 10.5 K/uL 12.9  14.4  14.6   Hemoglobin 13.0 - 17.0 g/dL 8.8  8.6  9.0   Hematocrit 39.0 - 52.0 % 26.5  25.7  27.1   Platelets 150 - 400 K/uL 411  411  390        Latest Ref Rng & Units 05/17/2023    6:28 AM 05/16/2023    5:11 AM 05/15/2023   12:29 AM  CMP  Glucose 70 - 99 mg/dL 191  478    BUN 8 - 23 mg/dL 17  20    Creatinine 2.95 - 1.24 mg/dL 6.21  3.08  6.57   Sodium 135 - 145 mmol/L 131  132    Potassium 3.5 - 5.1 mmol/L 4.1  4.1    Chloride 98 - 111 mmol/L 101  101    CO2 22 - 32 mmol/L 21  22    Calcium 8.9 - 10.3 mg/dL 8.0  8.0        Microbiology: [facility]- NG 2/11- knee culture finalized as neg asked to lab to keep the plate for 14 d   2/14 Tissue culture streptococcus Group C      Assessment/Plan: Left knee septic arthritis s/p I.D on vanco and ceftriaxone-  Synovial fluid culture was finalized as no growth- asked them to keep it for a longer period    Left foot TMA and there is a chronic discharging wound.on the lateral aspect which could be the sourcee- podiatry seen the patient and he underwent partial excision of 1,2,3 and 4 meta tarsal- cultures sent and pathology sent Culture so far only group C strep - from tissue Bone neg Pathology pending. Discussed with Dr.Fowler- bone was looking okay so Po should work Can send patient pon Cefadroxil 1 gram Po Q 12 for 3 more weeks with weekly CBC with diff and CMP     Anemia   PAD- rt BKA   CKD improving   DM  CAD managemet as per primary team   Discussed with hospitalist Will follow as OP 1 month

## 2023-05-17 NOTE — Progress Notes (Signed)
Patient discharged to [facility]. PIV removed. Called report to [facility] and spoke to Rena Lara, nurse. All discharge paperwork placed in discharge packet. Patient transported via EMS.

## 2023-05-17 NOTE — Plan of Care (Signed)
 Problem: Education: Goal: Ability to describe self-care measures that may prevent or decrease complications (Diabetes Survival Skills Education) will improve Outcome: Progressing Goal: Individualized Educational Video(s) Outcome: Progressing   Problem: Coping: Goal: Ability to adjust to condition or change in health will improve Outcome: Progressing   Problem: Fluid Volume: Goal: Ability to maintain a balanced intake and output will improve Outcome: Progressing   Problem: Health Behavior/Discharge Planning: Goal: Ability to identify and utilize available resources and services will improve Outcome: Progressing Goal: Ability to manage health-related needs will improve Outcome: Progressing   Problem: Metabolic: Goal: Ability to maintain appropriate glucose levels will improve Outcome: Progressing   Problem: Nutritional: Goal: Maintenance of adequate nutrition will improve Outcome: Progressing Goal: Progress toward achieving an optimal weight will improve Outcome: Progressing   Problem: Skin Integrity: Goal: Risk for impaired skin integrity will decrease Outcome: Progressing   Problem: Tissue Perfusion: Goal: Adequacy of tissue perfusion will improve Outcome: Progressing   Problem: Education: Goal: Knowledge of General Education information will improve Description: Including pain rating scale, medication(s)/side effects and non-pharmacologic comfort measures Outcome: Progressing   Problem: Health Behavior/Discharge Planning: Goal: Ability to manage health-related needs will improve Outcome: Progressing   Problem: Clinical Measurements: Goal: Ability to maintain clinical measurements within normal limits will improve Outcome: Progressing Goal: Will remain free from infection Outcome: Progressing Goal: Diagnostic test results will improve Outcome: Progressing Goal: Respiratory complications will improve Outcome: Progressing Goal: Cardiovascular complication will  be avoided Outcome: Progressing   Problem: Activity: Goal: Risk for activity intolerance will decrease Outcome: Progressing   Problem: Nutrition: Goal: Adequate nutrition will be maintained Outcome: Progressing   Problem: Coping: Goal: Level of anxiety will decrease Outcome: Progressing   Problem: Elimination: Goal: Will not experience complications related to bowel motility Outcome: Progressing Goal: Will not experience complications related to urinary retention Outcome: Progressing   Problem: Pain Managment: Goal: General experience of comfort will improve and/or be controlled Outcome: Progressing   Problem: Safety: Goal: Ability to remain free from injury will improve Outcome: Progressing   Problem: Skin Integrity: Goal: Risk for impaired skin integrity will decrease Outcome: Progressing   Problem: Clinical Measurements: Goal: Ability to avoid or minimize complications of infection will improve Outcome: Progressing   Problem: Skin Integrity: Goal: Skin integrity will improve Outcome: Progressing   Problem: Education: Goal: Knowledge of General Education information will improve Description: Including pain rating scale, medication(s)/side effects and non-pharmacologic comfort measures Outcome: Progressing   Problem: Health Behavior/Discharge Planning: Goal: Ability to manage health-related needs will improve Outcome: Progressing   Problem: Clinical Measurements: Goal: Ability to maintain clinical measurements within normal limits will improve Outcome: Progressing Goal: Will remain free from infection Outcome: Progressing Goal: Diagnostic test results will improve Outcome: Progressing Goal: Respiratory complications will improve Outcome: Progressing Goal: Cardiovascular complication will be avoided Outcome: Progressing   Problem: Activity: Goal: Risk for activity intolerance will decrease Outcome: Progressing   Problem: Nutrition: Goal: Adequate  nutrition will be maintained Outcome: Progressing   Problem: Coping: Goal: Level of anxiety will decrease Outcome: Progressing   Problem: Elimination: Goal: Will not experience complications related to bowel motility Outcome: Progressing Goal: Will not experience complications related to urinary retention Outcome: Progressing   Problem: Pain Managment: Goal: General experience of comfort will improve and/or be controlled Outcome: Progressing   Problem: Safety: Goal: Ability to remain free from injury will improve Outcome: Progressing   Problem: Skin Integrity: Goal: Risk for impaired skin integrity will decrease Outcome: Progressing   Problem: Education: Goal: Verbalization  of understanding the information provided (i.e., activity precautions, restrictions, etc) will improve Outcome: Progressing Goal: Individualized Educational Video(s) Outcome: Progressing

## 2023-05-17 NOTE — TOC Transition Note (Signed)
Transition of Care [facility]) - Discharge Note   Patient Details  Name: Miguel Harvey MRN: [id number] Date of Birth: Jun 22, 1945  [facility]) CM/SW Contact:  Allena Katz, LCSW Phone Number: 05/17/2023, 3:20 PM   Clinical Narrative:  Pt has orders to discharge back to [facility] for LTC.RN given number for report. DC summary to be sent to facilty. Medical neccesity on unit.    Final next level of care: Skilled Nursing Facility Barriers to Discharge: Barriers Resolved   Patient Goals and CMS Choice Patient states their goals for this hospitalization and ongoing recovery are:: return to [facility] CMS Medicare.gov Compare Post Acute Care list provided to:: Patient Choice offered to / list presented to : Patient      Discharge Placement              Patient chooses bed at: [facility] Patient to be transferred to facility by: [facility]   Patient and family notified of of transfer: 05/17/23  Discharge Plan and Services Additional resources added to the After Visit Summary for                                       Social Drivers of Health (SDOH) Interventions SDOH Screenings   Food Insecurity: No Food Insecurity (05/11/2023)  Housing: Unknown (05/11/2023)  Transportation Needs: No Transportation Needs (05/11/2023)  Utilities: Not At Risk (05/11/2023)  Physical Activity: Inactive (08/05/2017)  Social Connections: Socially Isolated (05/11/2023)  Stress: No Stress Concern Present (08/05/2017)  Tobacco Use: High Risk (05/14/2023)     Readmission Risk Interventions     No data to display

## 2023-05-17 NOTE — Inpatient Diabetes Management (Signed)
Inpatient Diabetes Program Recommendations  AACE/ADA: New Consensus Statement on Inpatient Glycemic Control   Target Ranges:  Prepandial:   less than 140 mg/dL      Peak postprandial:   less than 180 mg/dL (1-2 hours)      Critically ill patients:  140 - 180 mg/dL    Latest Reference Range & Units 05/16/23 07:56 05/16/23 12:49 05/16/23 17:02 05/16/23 22:02 05/17/23 08:51 05/17/23 11:28  Glucose-Capillary 70 - 99 mg/dL 846 (H) 962 (H) 952 (H) 152 (H) 199 (H) 261 (H)   Review of Glycemic Control  Diabetes history: DM2 Outpatient Diabetes medications: Toujeo 20 u nits daily, Lyumjev 8 units QAM, Tradjenta 5 mg daily, Trulicity 1.5 mg Qweek (not taking), Dexcom G7 CGM Current orders for Inpatient glycemic control: Semglee 10 units daily, Novolog 5 units TID with meals, Novolog 0-9 units TID with meals, Novolog 0-5 units QHS  Inpatient Diabetes Program Recommendations:    Insulin: Please consider increasing meal coverage to Novolog 8 units TID with meals.  Thanks, Orlando Penner, RN, MSN, CDCES Diabetes Coordinator Inpatient Diabetes Program [phone] (Team Pager from 8am to 5pm)

## 2023-05-17 NOTE — Anesthesia Postprocedure Evaluation (Signed)
Anesthesia Post Note  Patient: [name]  Procedure(s) Performed: IRRIGATION AND DEBRIDEMENT FOOT (Left: Foot)  Patient location during evaluation: PACU Anesthesia Type: General Level of consciousness: awake and alert Pain management: pain level controlled Vital Signs Assessment: post-procedure vital signs reviewed and stable Respiratory status: spontaneous breathing, nonlabored ventilation, respiratory function stable and patient connected to nasal cannula oxygen Cardiovascular status: blood pressure returned to baseline and stable Postop Assessment: no apparent nausea or vomiting Anesthetic complications: no   No notable events documented.   Last Vitals:  Vitals:   05/16/23 2202 05/17/23 0609  BP: 131/64 (!) 146/72  Pulse: 75 77  Resp: 16 18  Temp: 37.3 C 36.6 C  SpO2: 98% 98%    Last Pain:  Vitals:   05/17/23 0609  TempSrc: Oral  PainSc:                  Cleda Mccreedy Keawe Marcello

## 2023-05-17 NOTE — [place] FL2 (Addendum)
[place] MEDICAID FL2 LEVEL OF CARE FORM     IDENTIFICATION  Patient Name: Miguel Harvey Birthdate: 1946/01/30 Sex: male Admission Date (Current Location): 05/10/2023  [facility] and [place] Number:  Chiropodist and Address:  [facility], [address] [place], [place] [address]      Provider Number: [id number]  Attending Physician Name and Address:  Loyce Dys, MD  Relative Name and Phone Number:  [facility] [place] (Daughter)  [phone]    Current Level of Care: Hospital Recommended Level of Care: Skilled Nursing Facility Prior Approval Number:    Date Approved/Denied:   PASRR Number: [id number]  Discharge Plan: SNF    Current Diagnoses: Patient Active Problem List   Diagnosis Date Noted   Cellulitis of left lower extremity 05/30/2023   Effusion of left knee 05/11/2023   Pyogenic arthritis of left knee joint (HCC) 05/11/2023   Left knee pain 05/10/2023   Type II diabetes mellitus with renal manifestations (HCC) 05/10/2023   HLD (hyperlipidemia) 05/10/2023   HTN (hypertension) 05/10/2023   Chronic diastolic CHF (congestive heart failure) (HCC) 05/10/2023   Chronic kidney disease, stage 3a (HCC) 05/10/2023   COPD (chronic obstructive pulmonary disease) (HCC) 05/10/2023   CAD (coronary artery disease) 05/10/2023   Hyponatremia 05/10/2023   Depression 05/10/2023   Fall    Positive blood cultures 05/12/2021   SIRS (systemic inflammatory response syndrome) (HCC) 11/17/2020   Hypertension associated with diabetes (HCC) 11/17/2020   Chronic systolic CHF (congestive heart failure) (HCC) 11/17/2020   PAD (peripheral artery disease) (HCC) 03/25/2018   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) 09/21/2017   Foot osteomyelitis, left (HCC) 08/05/2017   Pressure injury of skin 06/29/2017   Sepsis (HCC) 06/28/2017   Diabetic foot infection (HCC) 06/28/2017   Elevated troponin 06/28/2017   Pulmonary nodule  07/01/2011   Acute diastolic heart failure, 2D EF 84-13% with grade 2 diastloic dysfunction 05-30-2011   Family history of early CAD, brother died 25 CAD 05/30/11   Hyperlipidemia associated with type 2 diabetes mellitus (HCC) 2011/05/30   NICM, minor CAD at cath, EF 25% at cath, 55% by 2D 2011-05-30   Type 2 diabetes mellitus with complication, with long-term current use of insulin (HCC) 05/07/2011   HTN (hypertension), malignant 05/07/2011   TOBACCO DEPENDENCE 05/27/2006    Orientation RESPIRATION BLADDER Height & Weight     Self, Time, Situation  Normal Continent Weight: 209 lb 10.5 oz (95.1 kg) Height:  6' (182.9 cm)  BEHAVIORAL SYMPTOMS/MOOD NEUROLOGICAL BOWEL NUTRITION STATUS      Continent    AMBULATORY STATUS COMMUNICATION OF NEEDS Skin   Extensive Assist Verbally  (closed incison to L knee, diabetic foot ulcer to L foot.)                       Personal Care Assistance Level of Assistance  Bathing, Feeding, Total care Bathing Assistance: Maximum assistance Feeding assistance: Maximum assistance Dressing Assistance: Maximum assistance Total Care Assistance: Maximum assistance   Functional Limitations Info  Sight, Speech, Hearing Sight Info: Adequate Hearing Info: Adequate Speech Info: Adequate    SPECIAL CARE FACTORS FREQUENCY  PT (By licensed PT), OT (By licensed OT)     PT Frequency: 5 times a week OT Frequency: 5 times a week            Contractures Contractures Info: Not present    Additional Factors Info  Code Status, Allergies Code Status Info: FUKK Allergies Info: Spironoolactone  Current Medications (05/17/2023):  This is the current hospital active medication list Current Facility-Administered Medications  Medication Dose Route Frequency Provider Last Rate Last Admin   acetaminophen (TYLENOL) tablet 650 mg  650 mg Oral Q6H PRN Gwyneth Revels, DPM       albuterol (PROVENTIL) (2.5 MG/3ML) 0.083% nebulizer solution 2.5 mg  2.5 mg  Nebulization Q4H PRN Gwyneth Revels, DPM       aspirin EC tablet 81 mg  81 mg Oral Daily Gwyneth Revels, DPM   81 mg at 05/17/23 4098   atorvastatin (LIPITOR) tablet 20 mg  20 mg Oral QPM Gwyneth Revels, DPM   20 mg at 05/16/23 1725   carvedilol (COREG) tablet 6.25 mg  6.25 mg Oral BID WC Gwyneth Revels, DPM   6.25 mg at 05/17/23 1191   cefTRIAXone (ROCEPHIN) 2 g in sodium chloride 0.9 % 100 mL IVPB  2 g Intravenous Q24H Gwyneth Revels, DPM   Stopping previously hung infusion at 05/16/23 2307   clopidogrel (PLAVIX) tablet 75 mg  75 mg Oral Daily Gwyneth Revels, DPM   75 mg at 05/17/23 4782   dextromethorphan-guaiFENesin (MUCINEX DM) 30-600 MG per 12 hr tablet 1 tablet  1 tablet Oral BID PRN Gwyneth Revels, DPM   1 tablet at 05/17/23 1202   docusate sodium (COLACE) capsule 100 mg  100 mg Oral BID Gwyneth Revels, DPM   100 mg at 05/17/23 9562   feeding supplement (ENSURE ENLIVE / ENSURE PLUS) liquid 237 mL  237 mL Oral BID BM Gwyneth Revels, DPM   237 mL at 05/17/23 1433   furosemide (LASIX) tablet 20 mg  20 mg Oral Daily Gwyneth Revels, DPM   20 mg at 05/17/23 1308   heparin injection 5,000 Units  5,000 Units Subcutaneous Q8H Gwyneth Revels, DPM   5,000 Units at 05/17/23 1432   hydrALAZINE (APRESOLINE) injection 5 mg  5 mg Intravenous Q2H PRN Gwyneth Revels, DPM       HYDROcodone-acetaminophen (NORCO) 7.5-325 MG per tablet 1 tablet  1 tablet Oral Q4H PRN Gwyneth Revels, DPM   1 tablet at 05/16/23 1725   insulin aspart (novoLOG) injection 0-5 Units  0-5 Units Subcutaneous QHS Gwyneth Revels, DPM   3 Units at 05/15/23 2148   insulin aspart (novoLOG) injection 0-9 Units  0-9 Units Subcutaneous TID WC Gwyneth Revels, DPM   5 Units at 05/17/23 1203   insulin aspart (novoLOG) injection 5 Units  5 Units Subcutaneous TID WC Gwyneth Revels, DPM   5 Units at 05/17/23 1203   insulin glargine-yfgn (SEMGLEE) injection 10 Units  10 Units Subcutaneous Daily Gwyneth Revels, DPM   10 Units at 05/17/23 0922    latanoprost (XALATAN) 0.005 % ophthalmic solution 1 drop  1 drop Both Eyes QHS Gwyneth Revels, DPM   1 drop at 05/13/23 2104   menthol-cetylpyridinium (CEPACOL) lozenge 3 mg  1 lozenge Oral PRN Gwyneth Revels, DPM       Or   phenol (CHLORASEPTIC) mouth spray 1 spray  1 spray Mouth/Throat PRN Gwyneth Revels, DPM       metoCLOPramide (REGLAN) tablet 5-10 mg  5-10 mg Oral Q8H PRN Gwyneth Revels, DPM       Or   metoCLOPramide (REGLAN) injection 5-10 mg  5-10 mg Intravenous Q8H PRN Gwyneth Revels, DPM       morphine (PF) 2 MG/ML injection 0.5-1 mg  0.5-1 mg Intravenous Q2H PRN Gwyneth Revels, DPM   0.5 mg at 05/16/23 0033   multivitamin with minerals tablet 1 tablet  1  tablet Oral Daily Gwyneth Revels, DPM   1 tablet at 05/17/23 8295   mupirocin cream (BACTROBAN) 2 %   Topical Daily Gwyneth Revels, DPM   Given at 05/16/23 1133   nicotine (NICODERM CQ - dosed in mg/24 hours) patch 21 mg  21 mg Transdermal Daily Gwyneth Revels, DPM       ondansetron [facility]) injection 4 mg  4 mg Intravenous Q8H PRN Gwyneth Revels, DPM       oxyCODONE-acetaminophen (PERCOCET/ROXICET) 5-325 MG per tablet 1 tablet  1 tablet Oral Q4H PRN Gwyneth Revels, DPM   1 tablet at 05/17/23 6213   polyvinyl alcohol (LIQUIFILM TEARS) 1.4 % ophthalmic solution 1 drop  1 drop Both Eyes QID Gwyneth Revels, DPM   1 drop at 05/14/23 0841   sertraline (ZOLOFT) tablet 50 mg  50 mg Oral Daily Gwyneth Revels, DPM   50 mg at 05/17/23 0865   sodium chloride tablet 1 g  1 g Oral BID WC Gwyneth Revels, DPM   1 g at 05/17/23 7846   traZODone (DESYREL) tablet 50 mg  50 mg Oral QHS PRN Gwyneth Revels, DPM   50 mg at 05/13/23 2104   vitamin B-12 (CYANOCOBALAMIN) tablet 100 mcg  100 mcg Oral Daily Gwyneth Revels, DPM   100 mcg at 05/17/23 9629     Discharge Medications: Please see discharge summary for a list of discharge medications.   STOP taking these medications     lisinopril 5 MG tablet Commonly known as: ZESTRIL           TAKE these  medications     aspirin EC 81 MG tablet Take 1 tablet (81 mg total) by mouth daily.    atorvastatin 20 MG tablet Commonly known as: LIPITOR Take 20 mg by mouth every evening.    B-12 PO Take 500 mcg by mouth daily at 2 am.    carboxymethylcellulose 0.5 % Soln Commonly known as: REFRESH PLUS Place 1 drop into both eyes 4 (four) times daily.    carvedilol 6.25 MG tablet Commonly known as: COREG Take 6.25 mg by mouth 2 (two) times daily with a meal.    cefadroxil 500 MG capsule Commonly known as: DURICEF Take 2 capsules (1,000 mg total) by mouth 2 (two) times daily for 21 days.    clopidogrel 75 MG tablet Commonly known as: PLAVIX Take 1 tablet (75 mg total) by mouth daily.    Dexcom G7 Receiver Devi 1 Device by Does not apply route continuous.    Dexcom G7 Sensor Misc 1 Device by Does not apply route continuous.    furosemide 20 MG tablet Commonly known as: LASIX Take 20 mg by mouth daily. D/c'd on 05/11/23 per MAR    guaifenesin 100 MG/5ML syrup Commonly known as: ROBITUSSIN Take 10 mLs by mouth every 8 (eight) hours as needed for cough.    HYDROcodone-acetaminophen 7.5-325 MG tablet Commonly known as: NORCO Take 1 tablet by mouth every 6 (six) hours as needed for severe pain (pain score 7-10).    ipratropium-albuterol 0.5-2.5 (3) MG/3ML Soln Commonly known as: DUONEB Take 3 mLs by nebulization every 6 (six) hours as needed.    latanoprost 0.005 % ophthalmic solution Commonly known as: XALATAN Place 1 drop into both eyes at bedtime.    linagliptin 5 MG Tabs tablet Commonly known as: TRADJENTA Take 5 mg by mouth daily.    Lyumjev 100 UNIT/ML Soln Generic drug: Insulin Lispro-aabc Inject 8 Units as directed daily at 6 (six) AM. BASED ON  SLIDING SCALE    Magnesium 400 MG Tabs Take 400 mg by mouth 2 (two) times daily.    multivitamin with minerals Tabs tablet Take 1 tablet by mouth daily.    sertraline 50 MG tablet Commonly known as: ZOLOFT Take 50 mg  by mouth daily.    sodium chloride 1 g tablet Take 1 tablet (1 g total) by mouth 2 (two) times daily with a meal.    Toujeo Max SoloStar 300 UNIT/ML Solostar Pen Generic drug: insulin glargine (2 Unit Dial) Inject 20 Units into the skin daily at 6 (six) AM.    traZODone 50 MG tablet Commonly known as: DESYREL Take 0.5 mg by mouth at bedtime.      Relevant Imaging Results:  Relevant Lab Results:   Additional Information [id number]  Allena Katz, LCSW

## 2023-05-17 NOTE — Progress Notes (Signed)
PT Cancellation Note  Patient Details Name: Miguel Harvey MRN: [id number] DOB: 1945/08/12   Cancelled Treatment:    Reason Eval/Treat Not Completed: Other (comment)  Offered and encouraged x 2 today.   AM pt stated he was going to sleep.  Tried to get pt to participate before napping but flatly refused and closed his eyes.  PM pt awake but stated he would not be doing any therapy.  Education of risks/benefits and his want to return to ALF is dependant of participation of therapy for mobility but he remained firm "I'll be all right"  unable to persuade pt when approached on several different angles.    New NWB orders were noted on 2/14.  Pt will need to be [facility] lift transfer at this time to/from chair for OOB mobility.  It may be reasonable for sliding board but given amp on R and NWB L he would need to rely heavily on BUE for lateral scooting which would be challenging for him and staff.  If ALF can manage Michiel Sites transfers return is reasonable, if not SNF would be recommended but at this time, pt has been refusing and resistant to participate in therapy sessions.     Danielle Dess 05/17/2023, 1:29 PM

## 2023-05-17 NOTE — Discharge Summary (Signed)
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 male with medical history significant of HTN, HLD, DM, dCHF, COPD on 2L O2, CAD, depression, CKD-3A, PEA, NSVT, chronic left foot osteomyelitis, s/p right BKA, tobacco abuse, who presents to ED via [facility] from [facility] 05/10/23. Complaints left knee pain x3 days, constant, sharp, severe, nonradiating, aggravated by movement, notes swelling with warmth.  No fever or chills. He has chronic left foot ulcer without drainage. Facility has been treating for LLE cellulitis.  Patient was seen by orthopedic surgeon given concerns of septic arthritis and underwent surgical drainage and washout.  He was also seen by podiatrist given concerns of left foot osteomyelitis and underwent debridement.  Patient was seen by infectious disease with recommendation to have 3 more weeks of cefadroxil 1 g twice daily.  Patient is also to have weekly labs including CBC as well as CMP and to follow-up with above-mentioned  providers.   Consultants: As mentioned above Procedures performed: As mentioned above Disposition: Assisted living Diet recommendation:  Cardiac diet DISCHARGE MEDICATION: Allergies as of 05/17/2023       Reactions   Spironolactone    Hyperkalemia (currently takes spironolactone)        Medication List     STOP taking these medications    lisinopril 5 MG tablet Commonly known as: ZESTRIL       TAKE these medications    aspirin EC 81 MG tablet Take 1 tablet (81 mg total) by mouth daily.   atorvastatin 20 MG tablet Commonly known as: LIPITOR Take 20 mg by mouth every evening.   B-12 PO Take 500 mcg by mouth daily at 2 am.   carboxymethylcellulose 0.5 % Soln Commonly known as: REFRESH PLUS Place 1 drop into both eyes 4 (four) times daily.   carvedilol 6.25 MG tablet Commonly known as: COREG Take 6.25 mg by mouth 2 (two) times daily with a meal.   cefadroxil 500 MG capsule Commonly known as: DURICEF Take 2 capsules (1,000 mg total) by mouth 2 (two) times daily for 21 days.   clopidogrel 75 MG tablet Commonly known as: PLAVIX Take 1 tablet (75 mg total) by mouth daily.   Dexcom G7 Receiver Devi 1 Device by Does not apply route continuous.   Dexcom G7 Sensor Misc 1 Device by Does not apply route continuous.   furosemide 20 MG tablet Commonly known as: LASIX Take 20 mg by mouth daily. D/c'd on 05/11/23 per MAR   guaifenesin 100 MG/5ML syrup Commonly known as: ROBITUSSIN Take 10 mLs by mouth every 8 (eight) hours as needed for cough.   HYDROcodone-acetaminophen 7.5-325 MG  tablet Commonly known as: NORCO Take 1 tablet by mouth every 6 (six) hours as needed for severe pain (pain score 7-10).   ipratropium-albuterol 0.5-2.5 (3) MG/3ML Soln Commonly known as: DUONEB Take 3 mLs by nebulization every 6 (six) hours as needed.   latanoprost 0.005 % ophthalmic solution Commonly known as: XALATAN Place 1 drop into both eyes at bedtime.   linagliptin 5 MG  Tabs tablet Commonly known as: TRADJENTA Take 5 mg by mouth daily.   Lyumjev 100 UNIT/ML Soln Generic drug: Insulin Lispro-aabc Inject 8 Units as directed daily at 6 (six) AM. BASED ON SLIDING SCALE   Magnesium 400 MG Tabs Take 400 mg by mouth 2 (two) times daily.   multivitamin with minerals Tabs tablet Take 1 tablet by mouth daily.   sertraline 50 MG tablet Commonly known as: ZOLOFT Take 50 mg by mouth daily.   sodium chloride 1 g tablet Take 1 tablet (1 g total) by mouth 2 (two) times daily with a meal.   Toujeo Max SoloStar 300 UNIT/ML Solostar Pen Generic drug: insulin glargine (2 Unit Dial) Inject 20 Units into the skin daily at 6 (six) AM.   traZODone 50 MG tablet Commonly known as: DESYREL Take 0.5 mg by mouth at bedtime.        Follow-up Information     [name], MD Follow up.   Specialty: Internal Medicine Why: Hospital follow up Contact information: [address] [place] [address] [phone]                Discharge Exam: Ceasar Mons Weights   05/10/23 2324 05/14/23 1158  Weight: 95.1 kg 95.1 kg   General: Not in acute distress Heme: No neck lymph node enlargement. Cardiac: S1/S2, RRR, No murmurs, No gallops or rubs. Respiratory: No rales, wheezing, rhonchi or rubs. GI: Soft, nondistended, nontender, no rebound pain, no organomegaly, BS present. GU: No hematuria Ext: No pitting leg edema bilaterally. 1+DP/PT pulse bilaterally. S/p of right BKA.  Dressing on the left knee is clean and dry    Condition at discharge: good  The results of significant diagnostics from this hospitalization (including imaging, microbiology, ancillary and laboratory) are listed below for reference.   Imaging Studies: DG MINI C-ARM IMAGE ONLY Result Date: 05/14/2023 There is no interpretation for this exam.  This order is for images obtained during a surgical procedure.  Please See "Surgeries" Tab for more information regarding the procedure.   MR FOOT  LEFT WO CONTRAST Result Date: 05/14/2023 CLINICAL DATA:  Diabetes.  Foot swelling.  Osteomyelitis suspected. EXAM: MRI OF THE LEFT FOOT WITHOUT CONTRAST TECHNIQUE: Multiplanar, multisequence MR imaging of the left forefoot was performed. No intravenous contrast was administered. COMPARISON:  left foot radiographs 05/10/2023, MRI left forefoot 05/09/2021 FINDINGS: Bones/Joint/Cartilage Adjacent to the soft tissue ulcer described below, there is high-grade decreased T1 and increased T2 signal marrow edema within the distal 2.3 cm of the remaining postsurgical fourth metatarsal. There is erosion at the distal plantar aspect of the fourth metatarsal stump (axial series 3 images 10 through 13). There is also serpiginous, heterogeneous T1 and T2 signal extending throughout the postsurgical fourth metatarsal shaft of the proximal fourth metatarsal metaphysis suggesting a bone infarct. Moderate talonavicular, mild navicular-cuneiform, and moderate first through fifth tarsometatarsal osteoarthritis including cartilage thinning and peripheral osteophytes. Ligaments The Lisfranc ligament complex is intact. Muscles and Tendons There is again moderate to high-grade edema throughout the plantar and dorsal musculature, nonspecific denervation changes versus myositis. There is edema  throughout the visualized distal flexor hallucis longus tendon. Soft tissues There is an ulcer at the plantar lateral foot, plantar to the distal fourth metatarsal shaft (sagittal series 7, image 25 and axial series 4, image 12) measuring up to approximately 11 mm in transverse dimension (axial series 4, image 12) and 16 mm in long axis foot dimension (sagittal series 10, image 25). There is mild-to-moderate subcutaneous fat edema and swelling throughout the dorsal midfoot and the hindfoot. IMPRESSION: 1. Ulcer at the plantar lateral foot, plantar to the distal fourth metatarsal shaft. Adjacent to the ulcer, there is cortical erosion indicating  osteomyelitis with associated marrow edema throughout the distal 2.3 cm of the remaining postsurgical fourth metatarsal. 2. Additional bone infarct extending throughout the postsurgical fourth metatarsal shaft and proximal fourth metatarsal metaphysis. 3. Moderate to high-grade edema throughout the plantar and dorsal musculature, nonspecific denervation changes versus myositis. Electronically Signed   By: Neita Garnet M.D.   On: 05/14/2023 08:56   DG Knee 1-2 Views Left Result Date: 05/11/2023 CLINICAL DATA:  79 year old male with history of left-sided knee pain. EXAM: LEFT KNEE - 1-2 VIEW COMPARISON:  No priors. FINDINGS: Two views of the left knee demonstrate a small suprapatellar effusion. No acute displaced fracture. There is joint space narrowing, subchondral sclerosis, subchondral cyst formation and osteophyte formation noted in a tricompartmental distribution, most severe in the medial and patellofemoral compartments, indicative of osteoarthritis. IMPRESSION: 1. Suprapatellar joint effusion. 2. No evidence of acute displaced fracture. 3. 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 Electronically Signed   By: Trudie Reed M.D.   On: 05/11/2023 06:15   DG Chest Port 1 View Result Date: 05/10/2023 CLINICAL DATA:  Fever.  Forefoot cellulitis. EXAM: PORTABLE CHEST 1 VIEW COMPARISON:  05/08/2021 FINDINGS: Heart size is normal. Mediastinal shadows are normal. No evidence of consolidation or lobar collapse. No visible effusion. IMPRESSION: No active disease. Electronically Signed   By: Paulina Fusi M.D.   On: 05/10/2023 21:09   DG Foot 2 Views Left Result Date: 05/10/2023 CLINICAL DATA:  Left lower extremity cellulitis. EXAM: LEFT FOOT - 2 VIEW COMPARISON:  MRI 05/09/2021 FINDINGS: Previous forefoot amputation. Some air/gas apparently in the soft tissues along the anterior margin. No radiographic sign of osteomyelitis or lytic destruction. No periosteal  elevation. IMPRESSION: Previous forefoot amputation. Some air/gas apparently in the soft tissues along the anterior margin. No radiographic sign of osteomyelitis. Electronically Signed   By: Paulina Fusi M.D.   On: 05/10/2023 21:07    Microbiology: Results for orders placed or performed during the hospital encounter of 05/10/23  Resp panel by RT-PCR (RSV, Flu A&B, Covid) Anterior Nasal Swab     Status: None   Collection Time: 05/10/23  8:26 PM   Specimen: Anterior Nasal Swab  Result Value Ref Range Status   SARS Coronavirus 2 by RT PCR NEGATIVE NEGATIVE Final    Comment: (NOTE) SARS-CoV-2 target nucleic acids are NOT DETECTED.  The SARS-CoV-2 RNA is generally detectable in upper respiratory specimens during the acute phase of infection. The lowest concentration of SARS-CoV-2 viral copies this assay can detect is 138 copies/mL. A negative result does not preclude SARS-Cov-2 infection and should not be used as the sole basis for treatment or other patient management decisions. A negative result may occur with  improper specimen collection/handling, submission of specimen other than nasopharyngeal swab, presence of viral mutation(s) within the areas targeted by this assay, and inadequate number of viral copies(<138 copies/mL). A negative result must  be combined with clinical observations, patient history, and epidemiological information. The expected result is Negative.  Fact Sheet for Patients:  BloggerCourse.com  Fact Sheet for Healthcare Providers:  SeriousBroker.it  This test is no t yet approved or cleared by the [place] FDA and  has been authorized for detection and/or diagnosis of SARS-CoV-2 by FDA under an Emergency Use Authorization (EUA). This EUA will remain  in effect (meaning this test can be used) for the duration of the COVID-19 declaration under Section 564(b)(1) of the Act, 21 U.S.C.section 360bbb-3(b)(1),  unless the authorization is terminated  or revoked sooner.       Influenza A by PCR NEGATIVE NEGATIVE Final   Influenza B by PCR NEGATIVE NEGATIVE Final    Comment: (NOTE) The Xpert Xpress SARS-CoV-2/FLU/RSV plus assay is intended as an aid in the diagnosis of influenza from Nasopharyngeal swab specimens and should not be used as a sole basis for treatment. Nasal washings and aspirates are unacceptable for Xpert Xpress SARS-CoV-2/FLU/RSV testing.  Fact Sheet for Patients: BloggerCourse.com  Fact Sheet for Healthcare Providers: SeriousBroker.it  This test is not yet approved or cleared by the [place] FDA and has been authorized for detection and/or diagnosis of SARS-CoV-2 by FDA under an Emergency Use Authorization (EUA). This EUA will remain in effect (meaning this test can be used) for the duration of the COVID-19 declaration under Section 564(b)(1) of the Act, 21 U.S.C. section 360bbb-3(b)(1), unless the authorization is terminated or revoked.     Resp Syncytial Virus by PCR NEGATIVE NEGATIVE Final    Comment: (NOTE) Fact Sheet for Patients: BloggerCourse.com  Fact Sheet for Healthcare Providers: SeriousBroker.it  This test is not yet approved or cleared by the [place] FDA and has been authorized for detection and/or diagnosis of SARS-CoV-2 by FDA under an Emergency Use Authorization (EUA). This EUA will remain in effect (meaning this test can be used) for the duration of the COVID-19 declaration under Section 564(b)(1) of the Act, 21 U.S.C. section 360bbb-3(b)(1), unless the authorization is terminated or revoked.  Performed at [facility], [address] [place], [place] [address]   Culture, blood (Routine X 2) w Reflex to ID Panel     Status: None   Collection Time: 05/10/23 11:49 PM   Specimen: BLOOD  Result Value Ref Range Status    Specimen Description BLOOD LEFT ANTECUBITAL  Final   Special Requests   Final    BOTTLES DRAWN AEROBIC AND ANAEROBIC Blood Culture results may not be optimal due to an excessive volume of blood received in culture bottles   Culture   Final    NO GROWTH 5 DAYS Performed at [facility], [address], [place], [place] [address]    Report Status 05/16/2023 FINAL  Final  Culture, blood (Routine X 2) w Reflex to ID Panel     Status: None   Collection Time: 05/10/23 11:49 PM   Specimen: BLOOD  Result Value Ref Range Status   Specimen Description BLOOD RIGHT ANTECUBITAL  Final   Special Requests   Final    BOTTLES DRAWN AEROBIC AND ANAEROBIC Blood Culture adequate volume   Culture   Final    NO GROWTH 5 DAYS Performed at [facility], [address], [place], [place] [address]    Report Status 05/16/2023 FINAL  Final  Body fluid culture w Gram Stain     Status: None (Preliminary result)   Collection Time: 05/11/23  8:34 AM   Specimen: Body Fluid  Result Value Ref Range Status   Specimen Description   Final    FLUID Performed at [facility], [address] [place], [place] [address]    Special Requests   Final    NONE Performed at [facility], [address] [place], [place] [address]    Gram Stain   Final    ABUNDANT WBC PRESENT, PREDOMINANTLY PMN RARE GRAM POSITIVE COCCI CRITICAL RESULT CALLED TO, READ BACK BY AND VERIFIED WITH: RN BOBACAR D. ON 05/11/23 @ 1521 BY DRT    Culture   Final    NO GROWTH 6 DAYS CONTINUING TO HOLD Performed at [facility], [address], [place], [place] [address]    Report Status PENDING  Incomplete  Aerobic/Anaerobic Culture w Gram Stain (surgical/deep wound)     Status: None (Preliminary result)   Collection Time: 05/14/23  1:06 PM   Specimen: Wound; Tissue  Result Value Ref Range Status   Specimen Description   Final    TISSUE Performed at [facility], [address]  [place], [place] [address]    Special Requests 4TH METATARSAL  Final   Gram Stain   Final    FEW WBC PRESENT,BOTH PMN AND MONONUCLEAR NO ORGANISMS SEEN    Culture   Final    NO GROWTH 3 DAYS NO ANAEROBES ISOLATED; CULTURE IN PROGRESS FOR 5 DAYS Performed at [facility], [address], [place], [place] [address]    Report Status PENDING  Incomplete  Aerobic/Anaerobic Culture w Gram Stain (surgical/deep wound)     Status: None (Preliminary result)   Collection Time: 05/14/23  1:22 PM   Specimen: Foot, Left; Tissue  Result Value Ref Range Status   Specimen Description   Final    WOUND Performed at [facility], [address] [place], [place] [address]    Special Requests LEFT FOOT  Final   Gram Stain   Final    RARE WBC PRESENT,BOTH PMN AND MONONUCLEAR RARE GRAM POSITIVE COCCI IN PAIRS    Culture   Final    RARE STREPTOCOCCUS GROUP C Beta hemolytic streptococci are predictably susceptible to penicillin and other beta lactams. Susceptibility testing not routinely performed. RARE STAPHYLOCOCCUS SIMULANS CULTURE REINCUBATED FOR BETTER GROWTH SUSCEPTIBILITIES TO FOLLOW Performed at [facility], [address], [place], [place] [address]    Report Status PENDING  Incomplete    Labs: CBC: Recent Labs  Lab 05/12/23 0533 05/13/23 0427 05/15/23 1551 05/16/23 0511 05/17/23 0628  WBC 15.6* 13.0* 14.6* 14.4* 12.9*  NEUTROABS  --   --   --  10.8* 9.8*  HGB 10.1* 9.6* 9.0* 8.6* 8.8*  HCT 30.0* 28.4* 27.1* 25.7* 26.5*  MCV 82.0 83.5 84.4 82.9 83.3  PLT 305 352 390 411* 411*   Basic Metabolic Panel: Recent Labs  Lab 05/10/23 2349 05/11/23 0716 05/11/23 0716 05/12/23 0533 05/13/23 0427 05/15/23 0029 05/16/23 0511 05/17/23 0628  NA  --  122*  --  130* 131*  --  132* 131*  K  --  4.3  --  4.3 4.0  --  4.1 4.1  CL  --  89*  --  97* 100  --  101 101  CO2  --  21*  --  23 21*  --  22 21*  GLUCOSE  --  335*  --  228* 261*  --  197* 191*  BUN  --  42*  --   39* 38*  --  20 17  CREATININE  --  1.38*   < > 1.21 1.21 0.98 0.94 0.90  CALCIUM  --  7.6*  --  7.8* 7.7*  --  8.0* 8.0*  MG 2.9*  --   --   --   --   --   --   --    < > = values in this interval not displayed.   Liver Function Tests: No results for input(s): "AST", "ALT", "ALKPHOS", "BILITOT", "PROT", "ALBUMIN" in the last 168 hours. CBG: Recent Labs  Lab 05/16/23 1249 05/16/23 1702 05/16/23 2202 05/17/23 0851 05/17/23 1128  GLUCAP 297* 252* 152* 199* 261*    Discharge time spent:  .  Signed: Loyce Dys, MD [facility] 05/17/2023

## 2023-05-18 LAB — SURGICAL PATHOLOGY

## 2023-05-19 LAB — AEROBIC/ANAEROBIC CULTURE W GRAM STAIN (SURGICAL/DEEP WOUND): Culture: NO GROWTH

## 2023-05-25 LAB — BODY FLUID CULTURE W GRAM STAIN: Culture: NO GROWTH

## 2023-06-02 LAB — COMPREHENSIVE METABOLIC PANEL WITH GFR: EGFR: 84

## 2023-06-14 LAB — COMPREHENSIVE METABOLIC PANEL (CC13): EGFR: 90

## 2023-06-15 ENCOUNTER — Encounter: Payer: Self-pay | Admitting: Emergency Medicine

## 2023-06-15 ENCOUNTER — Other Ambulatory Visit: Payer: Self-pay

## 2023-06-15 ENCOUNTER — Emergency Department
Admission: EM | Admit: 2023-06-15 | Discharge: 2023-06-15 | Disposition: A | Discharge status: Skilled Nursing Facility | Attending: Emergency Medicine | Admitting: Emergency Medicine

## 2023-06-15 ENCOUNTER — Ambulatory Visit: Payer: 59 | Attending: Infectious Diseases | Admitting: Infectious Diseases

## 2023-06-15 DIAGNOSIS — E1122 Type 2 diabetes mellitus with diabetic chronic kidney disease: Secondary | ICD-10-CM | POA: Diagnosis not present

## 2023-06-15 DIAGNOSIS — I129 Hypertensive chronic kidney disease with stage 1 through stage 4 chronic kidney disease, or unspecified chronic kidney disease: Secondary | ICD-10-CM | POA: Insufficient documentation

## 2023-06-15 DIAGNOSIS — N189 Chronic kidney disease, unspecified: Secondary | ICD-10-CM | POA: Insufficient documentation

## 2023-06-15 DIAGNOSIS — J449 Chronic obstructive pulmonary disease, unspecified: Secondary | ICD-10-CM | POA: Diagnosis not present

## 2023-06-15 DIAGNOSIS — D649 Anemia, unspecified: Secondary | ICD-10-CM | POA: Insufficient documentation

## 2023-06-15 DIAGNOSIS — I2510 Atherosclerotic heart disease of native coronary artery without angina pectoris: Secondary | ICD-10-CM | POA: Insufficient documentation

## 2023-06-15 DIAGNOSIS — L089 Local infection of the skin and subcutaneous tissue, unspecified: Secondary | ICD-10-CM

## 2023-06-15 LAB — CBC
HCT: 22.8 % — ABNORMAL LOW (ref 39.0–52.0)
Hemoglobin: 7.2 g/dL — ABNORMAL LOW (ref 13.0–17.0)
MCH: 27.1 pg (ref 26.0–34.0)
MCHC: 31.6 g/dL (ref 30.0–36.0)
MCV: 85.7 fL (ref 80.0–100.0)
Platelets: 341 10*3/uL (ref 150–400)
RBC: 2.66 MIL/uL — ABNORMAL LOW (ref 4.22–5.81)
RDW: 14.6 % (ref 11.5–15.5)
WBC: 9.5 10*3/uL (ref 4.0–10.5)
nRBC: 0 % (ref 0.0–0.2)

## 2023-06-15 LAB — COMPREHENSIVE METABOLIC PANEL
ALT: 14 U/L (ref 0–44)
AST: 18 U/L (ref 15–41)
Albumin: 2.1 g/dL — ABNORMAL LOW (ref 3.5–5.0)
Alkaline Phosphatase: 71 U/L (ref 38–126)
Anion gap: 6 (ref 5–15)
BUN: 18 mg/dL (ref 8–23)
CO2: 23 mmol/L (ref 22–32)
Calcium: 8 mg/dL — ABNORMAL LOW (ref 8.9–10.3)
Chloride: 109 mmol/L (ref 98–111)
Creatinine, Ser: 0.87 mg/dL (ref 0.61–1.24)
GFR, Estimated: >60 mL/min (ref >60)
Glucose, Bld: 171 mg/dL — ABNORMAL HIGH (ref 70–99)
Potassium: 3.5 mmol/L (ref 3.5–5.1)
Sodium: 138 mmol/L (ref 135–145)
Total Bilirubin: 0.4 mg/dL (ref 0.0–1.2)
Total Protein: 7 g/dL (ref 6.5–8.1)

## 2023-06-15 LAB — ABO/RH: ABO/RH(D): A POS

## 2023-06-15 LAB — PREPARE RBC (CROSSMATCH)

## 2023-06-15 MED ORDER — SODIUM CHLORIDE 0.9 % IV SOLN
10.0000 mL/h | Freq: Once | INTRAVENOUS | Status: AC
  Administered 2023-06-15: 10 mL/h via INTRAVENOUS

## 2023-06-15 NOTE — ED Triage Notes (Signed)
 Patient to ED via [facility] from [facility] for hgb of 7.3. PT denies any bleeding or hx of blood transfusions. NAD noted.

## 2023-06-15 NOTE — ED Notes (Signed)
 Called [facility] services to arrange transport to [facility] spoke to Rep: Onalee Hua @ 2211 provided required demographics. Truck is close by will arrive shortly.

## 2023-06-15 NOTE — ED Notes (Signed)
 First Nurse Note: Pt to ED via  [facility] from [facility] care. Pt here for low Hgb. Per facility pts Hgb has been down trending since he had surgery on his leg. Pts Hgb 7.3 today. Pt is in NAD.

## 2023-06-15 NOTE — Discharge Instructions (Signed)
 You were seen in the emergency department for anemia.  Your hemoglobin today was 7.2.  You not have any signs or symptoms of a GI bleed.  He did not have any signs of bleeding.  You were given a blood transfusion in the emergency department.  Hematology consultation was placed and you should be getting a phone call from hematology for outpatient follow-up.  Call your primary care physician to schedule repeat labs this week to make sure that your hemoglobin is improving.  If you have any signs of a GI bleed it is important you return immediately to the emergency department.  If you develop any symptoms of anemia including shortness of breath, fatigue, weakness or chest pain return to the ER.

## 2023-06-15 NOTE — ED Provider Notes (Signed)
 [facility] Provider Note    Event Date/Time   First MD Initiated Contact with Patient 06/15/23 1552     (approximate)   History   Abnormal Lab   HPI  Miguel Harvey is a 78 y.o. male past medical history significant for hypertension, hyperlipidemia, diabetes, HFpEF, COPD on 2 L of home oxygen, CAD, CKD, recent surgery for septic arthritis and osteomyelitis with debridement, who presents to the emergency department with concern for anemia.  Patient states he is uncertain of why he is here with sent from [facility] house.  States that he feels fine.  Denies fatigue, shortness of breath, chest pain, abdominal pain, blood in his stool or melanotic stool.  Does not believe that he is on anticoagulation.  States he feels fine -denies history of a GI bleed.  Denies chest pain.     Physical Exam   Triage Vital Signs: ED Triage Vitals  Encounter Vitals Group     BP 06/15/23 1413 (!) 112/57     Systolic BP Percentile --      Diastolic BP Percentile --      Pulse Rate 06/15/23 1413 75     Resp 06/15/23 1413 18     Temp 06/15/23 1413 98.4 F (36.9 C)     Temp Source 06/15/23 1413 Oral     SpO2 06/15/23 1413 99 %     Weight 06/15/23 1414 180 lb (81.6 kg)     Height 06/15/23 1414 6' (1.829 m)     Head Circumference --      Peak Flow --      Pain Score 06/15/23 1413 0     Pain Loc --      Pain Education --      Exclude from Growth Chart --     Most recent vital signs: Vitals:   06/15/23 1905 06/15/23 2042  BP: (!) 143/71 (!) 147/74  Pulse: 75 71  Resp: 18 18  Temp: 98.3 F (36.8 C) 98.4 F (36.9 C)  SpO2: 100% 100%    Physical Exam Exam conducted with a chaperone present.  Constitutional:      Appearance: He is well-developed.  HENT:     Head: Atraumatic.  Eyes:     Extraocular Movements: Extraocular movements intact.     Conjunctiva/sclera: Conjunctivae normal.     Pupils: Pupils are equal, round, and reactive to light.  Cardiovascular:      Rate and Rhythm: Regular rhythm.  Pulmonary:     Effort: No respiratory distress.  Abdominal:     Tenderness: There is no abdominal tenderness.  Genitourinary:    Comments: Rectal exam with no gross blood or melena Musculoskeletal:     Cervical back: Normal range of motion.  Skin:    General: Skin is warm.     Capillary Refill: Capillary refill takes less than 2 seconds.  Neurological:     Mental Status: He is alert. Mental status is at baseline.  Psychiatric:        Mood and Affect: Mood normal.     IMPRESSION / MDM / ASSESSMENT AND PLAN / ED COURSE  I reviewed the triage vital signs and the nursing notes.  Patient arrives to the emergency department from [facility] health care for hemoglobin of 7.3.  Differential diagnosis including anemia of chronic disease, GI bleed, blood loss from recent surgery, iron deficiency anemia  On chart review I do not see that the patient is on anticoagulation   LABS (all labs  ordered are listed, but only abnormal results are displayed) Labs interpreted as -    Labs Reviewed  COMPREHENSIVE METABOLIC PANEL - Abnormal; Notable for the following components:      Result Value   Glucose, Bld 171 (*)    Calcium 8.0 (*)    Albumin 2.1 (*)    All other components within normal limits  CBC - Abnormal; Notable for the following components:   RBC 2.66 (*)    Hemoglobin 7.2 (*)    HCT 22.8 (*)    All other components within normal limits  TYPE AND SCREEN  PREPARE RBC (CROSSMATCH)  ABO/RH     MDM  On chart review patient's hemoglobin level at time of admission was 10.1 and down trended to 8.6 at time of discharge.  Hemoglobin today is 7.2.  Appears normocytic.  Platelet counts are within normal limits.  No leukocytosis.  Creatinine is at baseline.  Normal BUN.  Albumin is low and appears chronically low.  Patient without signs or symptoms of a GI bleed.  No melanotic stool and normal BUN, not on anticoagulation.  Patient is currently  asymptomatic.  Given his downtrending hemoglobin of 7.2 patient was consented and typed and crossed for 1 unit PRBC.  Tolerated well without any issues.  Patient's remained asymptomatic and hemodynamically stable.  No signs of a GI bleed.  Feel the patient is stable to be discharged back to facility with close follow-up as an outpatient.  Placed a hematology referral to further workup his anemia.  Discussed recheck of his hemoglobin this week to make sure that his hemoglobin has responded and for further workup of his anemia.  Discussed return for any symptoms of anemia or signs of GI bleed.  No questions at time of discharge.     PROCEDURES:  Critical Care performed: yes  .Critical Care  Performed by: Corena Herter, MD Authorized by: Corena Herter, MD   Critical care provider statement:    Critical care time (minutes):  30   Critical care time was exclusive of:  Separately billable procedures and treating other patients   Critical care was necessary to treat or prevent imminent or life-threatening deterioration of the following conditions: anemia, blood transfusion.   Critical care was time spent personally by me on the following activities:  Development of treatment plan with patient or surrogate, discussions with consultants, evaluation of patient's response to treatment, examination of patient, ordering and review of laboratory studies, ordering and review of radiographic studies, ordering and performing treatments and interventions, pulse oximetry, re-evaluation of patient's condition and review of old charts   Patient's presentation is most consistent with acute presentation with potential threat to life or bodily function.   MEDICATIONS ORDERED IN ED: Medications  0.9 %  sodium chloride infusion (10 mL/hr Intravenous New Bag/Given 06/15/23 1849)    FINAL CLINICAL IMPRESSION(S) / ED DIAGNOSES   Final diagnoses:  Anemia, unspecified type     Rx / DC Orders   ED Discharge  Orders          Ordered    Ambulatory referral to Hematology / Oncology       Comments: Your emergency department provider has referred you to see a hematology/oncology specialist. These are physicians who specialize in blood disorders and cancers, or findings concerning for cancer. You will receive a phone call from the [facility] Office to set up your appointment within 2 business days: [employer] operate Mon - Fri, 8:00 a.m. to 5:00 p.m.; closed for federally  recognized holidays. Please be sure your phone is not set to block numbers during this time.   06/15/23 2124             Note:  This document was prepared using Dragon voice recognition software and may include unintentional dictation errors.   Corena Herter, MD 06/15/23 2126

## 2023-06-15 NOTE — Progress Notes (Signed)
 Pt had a virtual video visit - SNF called - pt is ebing sent to ED for low Hb Pt has completed antibiotics for the rt foot infection

## 2023-06-16 ENCOUNTER — Encounter: Payer: Self-pay | Admitting: Orthopedic Surgery

## 2023-06-16 LAB — TYPE AND SCREEN
ABO/RH(D): A POS
Antibody Screen: NEGATIVE
Unit division: 0

## 2023-06-16 LAB — BPAM RBC
Blood Product Expiration Date: 202503302359
ISSUE DATE / TIME: 202503181836
Unit Type and Rh: 600

## 2023-06-18 ENCOUNTER — Inpatient Hospital Stay: Attending: Oncology | Admitting: Oncology

## 2023-06-18 ENCOUNTER — Inpatient Hospital Stay

## 2023-06-18 ENCOUNTER — Encounter: Payer: Self-pay | Admitting: Oncology

## 2023-06-18 VITALS — BP 121/62 | HR 74 | Temp 97.7°F | Resp 18 | Ht 72.0 in | Wt 180.0 lb

## 2023-06-18 DIAGNOSIS — D649 Anemia, unspecified: Secondary | ICD-10-CM | POA: Diagnosis not present

## 2023-06-18 DIAGNOSIS — Z833 Family history of diabetes mellitus: Secondary | ICD-10-CM | POA: Diagnosis not present

## 2023-06-18 DIAGNOSIS — N1831 Chronic kidney disease, stage 3a: Secondary | ICD-10-CM | POA: Diagnosis present

## 2023-06-18 DIAGNOSIS — Z89511 Acquired absence of right leg below knee: Secondary | ICD-10-CM | POA: Diagnosis not present

## 2023-06-18 DIAGNOSIS — E119 Type 2 diabetes mellitus without complications: Secondary | ICD-10-CM

## 2023-06-18 DIAGNOSIS — Z79899 Other long term (current) drug therapy: Secondary | ICD-10-CM | POA: Insufficient documentation

## 2023-06-18 DIAGNOSIS — I2510 Atherosclerotic heart disease of native coronary artery without angina pectoris: Secondary | ICD-10-CM | POA: Diagnosis not present

## 2023-06-18 DIAGNOSIS — F32A Depression, unspecified: Secondary | ICD-10-CM | POA: Insufficient documentation

## 2023-06-18 DIAGNOSIS — F17210 Nicotine dependence, cigarettes, uncomplicated: Secondary | ICD-10-CM | POA: Diagnosis not present

## 2023-06-18 DIAGNOSIS — E785 Hyperlipidemia, unspecified: Secondary | ICD-10-CM | POA: Diagnosis not present

## 2023-06-18 DIAGNOSIS — Z8249 Family history of ischemic heart disease and other diseases of the circulatory system: Secondary | ICD-10-CM | POA: Insufficient documentation

## 2023-06-18 DIAGNOSIS — Z7902 Long term (current) use of antithrombotics/antiplatelets: Secondary | ICD-10-CM | POA: Insufficient documentation

## 2023-06-18 DIAGNOSIS — J449 Chronic obstructive pulmonary disease, unspecified: Secondary | ICD-10-CM | POA: Insufficient documentation

## 2023-06-18 DIAGNOSIS — Z8674 Personal history of sudden cardiac arrest: Secondary | ICD-10-CM | POA: Diagnosis not present

## 2023-06-18 DIAGNOSIS — E1122 Type 2 diabetes mellitus with diabetic chronic kidney disease: Secondary | ICD-10-CM | POA: Diagnosis not present

## 2023-06-18 DIAGNOSIS — I10 Essential (primary) hypertension: Secondary | ICD-10-CM

## 2023-06-18 DIAGNOSIS — I5042 Chronic combined systolic (congestive) and diastolic (congestive) heart failure: Secondary | ICD-10-CM | POA: Insufficient documentation

## 2023-06-18 DIAGNOSIS — D631 Anemia in chronic kidney disease: Secondary | ICD-10-CM | POA: Insufficient documentation

## 2023-06-18 DIAGNOSIS — Z7289 Other problems related to lifestyle: Secondary | ICD-10-CM | POA: Diagnosis not present

## 2023-06-18 DIAGNOSIS — E871 Hypo-osmolality and hyponatremia: Secondary | ICD-10-CM

## 2023-06-18 DIAGNOSIS — I130 Hypertensive heart and chronic kidney disease with heart failure and stage 1 through stage 4 chronic kidney disease, or unspecified chronic kidney disease: Secondary | ICD-10-CM | POA: Insufficient documentation

## 2023-06-18 DIAGNOSIS — R5383 Other fatigue: Secondary | ICD-10-CM

## 2023-06-18 LAB — COMPREHENSIVE METABOLIC PANEL
ALT: 15 U/L (ref 0–44)
AST: 17 U/L (ref 15–41)
Albumin: 2.3 g/dL — ABNORMAL LOW (ref 3.5–5.0)
Alkaline Phosphatase: 84 U/L (ref 38–126)
Anion gap: 9 (ref 5–15)
BUN: 16 mg/dL (ref 8–23)
CO2: 23 mmol/L (ref 22–32)
Calcium: 7.9 mg/dL — ABNORMAL LOW (ref 8.9–10.3)
Chloride: 104 mmol/L (ref 98–111)
Creatinine, Ser: 0.79 mg/dL (ref 0.61–1.24)
GFR, Estimated: >60 mL/min (ref >60)
Glucose, Bld: 143 mg/dL — ABNORMAL HIGH (ref 70–99)
Potassium: 3.7 mmol/L (ref 3.5–5.1)
Sodium: 136 mmol/L (ref 135–145)
Total Bilirubin: 0.2 mg/dL (ref 0.0–1.2)
Total Protein: 7.4 g/dL (ref 6.5–8.1)

## 2023-06-18 LAB — CBC WITH DIFFERENTIAL/PLATELET
Abs Immature Granulocytes: 0.05 10*3/uL (ref 0.00–0.07)
Basophils Absolute: 0.1 10*3/uL (ref 0.0–0.1)
Basophils Relative: 1 %
Eosinophils Absolute: 0.3 10*3/uL (ref 0.0–0.5)
Eosinophils Relative: 3 %
HCT: 25.4 % — ABNORMAL LOW (ref 39.0–52.0)
Hemoglobin: 7.8 g/dL — ABNORMAL LOW (ref 13.0–17.0)
Immature Granulocytes: 1 %
Lymphocytes Relative: 27 %
Lymphs Abs: 2.7 10*3/uL (ref 0.7–4.0)
MCH: 26.7 pg (ref 26.0–34.0)
MCHC: 30.7 g/dL (ref 30.0–36.0)
MCV: 87 fL (ref 80.0–100.0)
Monocytes Absolute: 0.6 10*3/uL (ref 0.1–1.0)
Monocytes Relative: 5 %
Neutro Abs: 6.5 10*3/uL (ref 1.7–7.7)
Neutrophils Relative %: 63 %
Platelets: 378 10*3/uL (ref 150–400)
RBC: 2.92 MIL/uL — ABNORMAL LOW (ref 4.22–5.81)
RDW: 14.8 % (ref 11.5–15.5)
WBC: 10.1 10*3/uL (ref 4.0–10.5)
nRBC: 0 % (ref 0.0–0.2)

## 2023-06-18 LAB — TSH: TSH: 2.637 u[IU]/mL (ref 0.350–4.500)

## 2023-06-18 LAB — RETICULOCYTES
Immature Retic Fract: 19.2 % — ABNORMAL HIGH (ref 2.3–15.9)
RBC.: 2.94 MIL/uL — ABNORMAL LOW (ref 4.22–5.81)
Retic Count, Absolute: 81.1 10*3/uL (ref 19.0–186.0)
Retic Ct Pct: 2.8 % (ref 0.4–3.1)

## 2023-06-18 LAB — LACTATE DEHYDROGENASE: LDH: 205 U/L — ABNORMAL HIGH (ref 98–192)

## 2023-06-18 LAB — IRON AND TIBC
Iron: 28 ug/dL — ABNORMAL LOW (ref 45–182)
Saturation Ratios: 15 % — ABNORMAL LOW (ref 17.9–39.5)
TIBC: 189 ug/dL — ABNORMAL LOW (ref 250–450)
UIBC: 161 ug/dL

## 2023-06-18 LAB — FERRITIN: Ferritin: 225 ng/mL (ref 24–336)

## 2023-06-18 LAB — SEDIMENTATION RATE: Sed Rate: 127 mm/h — ABNORMAL HIGH (ref 0–20)

## 2023-06-18 LAB — FOLATE: Folate: 5.4 ng/mL — ABNORMAL LOW (ref >5.9)

## 2023-06-19 ENCOUNTER — Encounter: Payer: Self-pay | Admitting: Oncology

## 2023-06-19 LAB — VITAMIN B12: Vitamin B-12: 1038 pg/mL — ABNORMAL HIGH (ref 180–914)

## 2023-06-19 NOTE — Progress Notes (Signed)
 Hematology/Oncology Consult note [facility] Telephone:([phone] [phone]  Patient Care Team: [name], MD as PCP - General (Internal Medicine) Antonieta Iba, MD as PCP - Cardiology (Cardiology) Creig Hines, MD as Consulting Physician (Oncology)   Name of the patient: Miguel Harvey  [id number]  1945-07-08    Reason for referral- anemia   Referring physician- Dr. Arnoldo Morale  Date of visit: 06/19/23   History of presenting illness- Patient is a 78 year old male with a past medical history significant for hypertension hyperlipidemia type 2 diabetes COPD CAD stage III CKD, right BKA tobacco abuse who is a resident of nursing home and has been referred to me for anemia.  He was discharged from the hospital in February 2025 for septic arthritis and chronic left foot ulcer requiring antibiotics for 3 weeks.  At baseline patient's hemoglobin is about 10 and it drifted down to 8.8 during hospitalization.  In late March 2025 was noted to have a hemoglobin of 7.2.  ECOG PS- 3  Pain scale- 3   Review of systems- Review of Systems  Constitutional:  Positive for malaise/fatigue. Negative for chills, fever and weight loss.  HENT:  Negative for congestion, ear discharge and nosebleeds.   Eyes:  Negative for blurred vision.  Respiratory:  Negative for cough, hemoptysis, sputum production, shortness of breath and wheezing.   Cardiovascular:  Negative for chest pain, palpitations, orthopnea and claudication.  Gastrointestinal:  Negative for abdominal pain, blood in stool, constipation, diarrhea, heartburn, melena, nausea and vomiting.  Genitourinary:  Negative for dysuria, flank pain, frequency, hematuria and urgency.  Musculoskeletal:  Negative for back pain, joint pain and myalgias.  Skin:  Negative for rash.  Neurological:  Negative for dizziness, tingling, focal weakness, seizures, weakness and headaches.  Endo/Heme/Allergies:  Does not bruise/bleed  easily.  Psychiatric/Behavioral:  Negative for depression and suicidal ideas. The patient does not have insomnia.     Allergies  Allergen Reactions   Spironolactone     Hyperkalemia (currently takes spironolactone)    Patient Active Problem List   Diagnosis Date Noted   Cellulitis of left lower extremity May 15, 2023   Effusion of left knee 05/11/2023   Pyogenic arthritis of left knee joint (HCC) 05/11/2023   Left knee pain 05/10/2023   Type II diabetes mellitus with renal manifestations (HCC) 05/10/2023   HLD (hyperlipidemia) 05/10/2023   HTN (hypertension) 05/10/2023   Chronic diastolic CHF (congestive heart failure) (HCC) 05/10/2023   Chronic kidney disease, stage 3a (HCC) 05/10/2023   COPD (chronic obstructive pulmonary disease) (HCC) 05/10/2023   CAD (coronary artery disease) 05/10/2023   Hyponatremia 05/10/2023   Depression 05/10/2023   Fall    Positive blood cultures 05/12/2021   SIRS (systemic inflammatory response syndrome) (HCC) 11/17/2020   Hypertension associated with diabetes (HCC) 11/17/2020   Chronic systolic CHF (congestive heart failure) (HCC) 11/17/2020   PAD (peripheral artery disease) (HCC) 03/25/2018   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) 09/21/2017   Foot osteomyelitis, left (HCC) 08/05/2017   Pressure injury of skin 06/29/2017   Sepsis (HCC) 06/28/2017   Diabetic foot infection (HCC) 06/28/2017   Elevated troponin 06/28/2017   Pulmonary nodule 07/01/2011   Acute diastolic heart failure, 2D EF 62-13% with grade 2 diastloic dysfunction 05/15/2011   Family history of early CAD, brother died 7 CAD May 15, 2011   Hyperlipidemia associated with type 2 diabetes mellitus (HCC) 2011-05-15   NICM, minor CAD at cath, EF 25% at cath, 55% by 2D 05-15-2011   Type 2  diabetes mellitus with complication, with long-term current use of insulin (HCC) 05/07/2011   HTN (hypertension), malignant 05/07/2011   TOBACCO DEPENDENCE 05/27/2006      Past Medical History:  Diagnosis Date   Abnormal posture    CHF (congestive heart failure) (HCC)    Chronic kidney disease    CKD   stage 2   Complication of anesthesia    heart stopped x2 during toe amputation   COPD (chronic obstructive pulmonary disease) (HCC)    Coronary artery disease, non-occlusive    a. LHC 05/2011: 10-20% ostial LAD stenosis   Depression    Diabetes mellitus    Grade I diastolic dysfunction    HLD (hyperlipidemia)    Hypertension    Muscle weakness    Neuromuscular disorder (HCC)    neuropathy   NICM (nonischemic cardiomyopathy) (HCC)    a. TTE 2/213: EF 50-55%, mild LVH, normal wall motion, Gr2DD, mildly dilated LA; b. TTE 2/13: EF 40-45%, mild concentric LVH, global HK, no evidence of LV thrombus; c. TTE 4/19: EF 40%, diffuse HK, Gr2DD, mild MR, mildly dilated LA, RV cavity size normal w/ normal RVSF, PASP 63; d. TTE 4/19: EF 25-30%, diffuse HK, mild LVH, mild MR, mild PASP; e. TTE 5/19: EF 20-25%, diffuse HK, moderate MR,   NSVT (nonsustained ventricular tachycardia) (HCC)    PAD (peripheral artery disease) (HCC)    a. s/p right BKA s/p  recent balloon angioplasty fo the left peroneal artery, left posterior tibial artery, tibioperoneal trunk, and stenting to the distal left SFA in 07/2017   PEA (Pulseless electrical activity) (HCC)    heart stopped beating during surgery for amputation of toes     Past Surgical History:  Procedure Laterality Date   AMPUTATION Right 07/02/2017   Procedure: RIGHT BELOW KNEE AMPUTATION;  Surgeon: Nadara Mustard, MD;  Location: [facility] OR;  Service: Orthopedics;  Laterality: Right;   AMPUTATION TOE Left 05/11/2021   Procedure: LEFT GREAT  TOE AMPUTATION;  Surgeon: Gwyneth Revels, DPM;  Location: [facility] ORS;  Service: Podiatry;  Laterality: Left;   CARDIAC CATHETERIZATION     CATARACT EXTRACTION W/PHACO Left 11/08/2018   Procedure: CATARACT EXTRACTION PHACO AND INTRAOCULAR LENS PLACEMENT (IOC)  LEFT VISION BLUE;  Surgeon:  Elliot Cousin, MD;  Location: [facility] ORS;  Service: Ophthalmology;  Laterality: Left;  [place] 03:38.9 CDE 42.37 Fluid pack lot # [id number]   CATARACT EXTRACTION W/PHACO Right 03/25/2023   Procedure: CATARACT EXTRACTION PHACO AND INTRAOCULAR LENS PLACEMENT (IOC) RIGHT DIABETIC MALYUGIN OMIDRIA 24.82 02:08.6;  Surgeon: Estanislado Pandy, MD;  Location: [facility];  Service: Ophthalmology;  Laterality: Right;   cyst removal from  right leg about 6 years ago     IRRIGATION AND DEBRIDEMENT FOOT Left 08/10/2017   Procedure: IRRIGATION AND DEBRIDEMENT FOOT;  Surgeon: Gwyneth Revels, DPM;  Location: [facility] ORS;  Service: Podiatry;  Laterality: Left;   IRRIGATION AND DEBRIDEMENT FOOT Left 05/14/2023   Procedure: IRRIGATION AND DEBRIDEMENT FOOT;  Surgeon: Gwyneth Revels, DPM;  Location: [facility] ORS;  Service: Orthopedics/Podiatry;  Laterality: Left;   KNEE ARTHROSCOPY Left 05/11/2023   Procedure: ARTHROSCOPIC IRRIGATION AND DEBRIDEMENT LEFT KNEE, SYNOVECTOMY;  Surgeon: Reinaldo Berber, MD;  Location: [facility] ORS;  Service: Orthopedics;  Laterality: Left;   LEFT HEART CATHETERIZATION WITH CORONARY ANGIOGRAM N/A 05/11/2011   Procedure: LEFT HEART CATHETERIZATION WITH CORONARY ANGIOGRAM;  Surgeon: Lennette Bihari, MD;  Location[facility] C[facility] LAB;  Service: Cardiovascular;  Laterality: N/A;   LOWER EXTREMITY ANGIOGRAPHY Left 08/13/2017   Procedure:  Lower Extremity Angiography and PICC line placement;  Surgeon: Annice Needy, MD;  Location: [facility] INVASIVE CV LAB;  Service: Cardiovascular;  Laterality: Left;   ruptured navel     repair of   TOE AMPUTATION     not sure of when this surgery occured, but his heart stopped beating x 2 during surgery    Social History   Socioeconomic History   Marital status: Single    Spouse name: danielle .... daughter   Number of children: Not on file   Years of education: Not on file   Highest education level: Not on file  Occupational History   Not on file  Tobacco Use    Smoking status: Every Day    Current packs/day: 0.00    Types: Cigarettes   Smokeless tobacco: Former    Quit date: 05/07/2011   Tobacco comments:    3 cigs per day (previous hx of smoking 1 PPD)  Vaping Use   Vaping status: Never Used  Substance and Sexual Activity   Alcohol use: Not Currently    Alcohol/week: 6.0 standard drinks of alcohol    Types: 6 Cans of beer per week   Drug use: Never   Sexual activity: Not Currently  Other Topics Concern   Not on file  Social History Narrative   Patient resides at [facility].   Social Drivers of Health   Financial Resource Strain: Patient Declined (06/08/2023)   Received from [facility]   Overall Financial Resource Strain (CARDIA)    Difficulty of Paying Living Expenses: Patient declined  Food Insecurity: Patient Declined (06/08/2023)   Received from [facility]   Hunger Vital Sign    Worried About Running Out of Food in the Last Year: Patient declined    Ran Out of Food in the Last Year: Patient declined  Transportation Needs: Patient Declined (06/08/2023)   Received from [facility] - Transportation    In the past 12 months, has lack of transportation kept you from medical appointments or from getting medications?: Patient declined    Lack of Transportation (Non-Medical): Patient declined  Physical Activity: Inactive (08/05/2017)   Exercise Vital Sign    Days of Exercise per Week: 0 days    Minutes of Exercise per Session: 0 min  Stress: No Stress Concern Present (08/05/2017)   [employer] of Occupational Health - Occupational Stress Questionnaire    Feeling of Stress : Not at all  Social Connections: Socially Isolated (05/11/2023)   Social Connection and Isolation Panel [NHANES]    Frequency of Communication with Friends and Family: Patient declined    Frequency of Social Gatherings with Friends and Family: Three times a week    Attends Religious  Services: Never    Active Member of Clubs or Organizations: No    Attends Banker Meetings: Never    Marital Status: Never married  Intimate Partner Violence: Not At Risk (05/11/2023)   Humiliation, Afraid, Rape, and Kick questionnaire    Fear of Current or Ex-Partner: No    Emotionally Abused: No    Physically Abused: No    Sexually Abused: No     Family History  Problem Relation Age of Onset   CAD Other    Diabetes Other      Current Outpatient Medications:    aspirin EC 81 MG EC tablet, Take 1 tablet (81 mg total) by mouth daily. (Patient taking differently: Take 81 mg by  mouth daily. D/c'd on 05/11/23 at 1858 per [facility]), Disp: 30 tablet, Rfl: 0   atorvastatin (LIPITOR) 20 MG tablet, Take 20 mg by mouth every evening., Disp: , Rfl:    carvedilol (COREG) 6.25 MG tablet, Take 6.25 mg by mouth 2 (two) times daily with a meal., Disp: , Rfl:    clopidogrel (PLAVIX) 75 MG tablet, Take 1 tablet (75 mg total) by mouth daily. (Patient taking differently: Take 75 mg by mouth daily. D/c'd on 05/11/23 per [facility]), Disp: 30 tablet, Rfl: 0   Continuous Glucose Receiver (DEXCOM G7 RECEIVER) DEVI, 1 Device by Does not apply route continuous., Disp: 1 each, Rfl: 0   Continuous Glucose Sensor (DEXCOM G7 SENSOR) MISC, 1 Device by Does not apply route continuous., Disp: 9 each, Rfl: 0   Cyanocobalamin (B-12 PO), Take 500 mcg by mouth daily at 2 am., Disp: , Rfl:    furosemide (LASIX) 20 MG tablet, Take 20 mg by mouth daily. D/c'd on 05/11/23 per [facility], Disp: , Rfl:    guaifenesin (ROBITUSSIN) 100 MG/5ML syrup, Take 10 mLs by mouth every 8 (eight) hours as needed for cough., Disp: , Rfl:    ibuprofen (ADVIL) 600 MG tablet, Take 600 mg by mouth every 6 (six) hours as needed., Disp: , Rfl:    insulin glargine, 2 Unit Dial, (TOUJEO MAX SOLOSTAR) 300 UNIT/ML Solostar Pen, Inject 20 Units into the skin daily at 6 (six) AM., Disp: , Rfl:    Insulin Lispro-aabc (LYUMJEV) 100 UNIT/ML SOLN, Inject 8 Units as  directed daily at 6 (six) AM. BASED ON SLIDING SCALE, Disp: , Rfl:    ipratropium-albuterol (DUONEB) 0.5-2.5 (3) MG/3ML SOLN, Take 3 mLs by nebulization every 6 (six) hours as needed., Disp: , Rfl:    latanoprost (XALATAN) 0.005 % ophthalmic solution, Place 1 drop into both eyes at bedtime., Disp: , Rfl:    linagliptin (TRADJENTA) 5 MG TABS tablet, Take 5 mg by mouth daily., Disp: , Rfl:    Magnesium 400 MG TABS, Take 400 mg by mouth 2 (two) times daily. , Disp: , Rfl:    Multiple Vitamin (MULTIVITAMIN WITH MINERALS) TABS tablet, Take 1 tablet by mouth daily., Disp: , Rfl:    oxyCODONE (OXY IR/ROXICODONE) 5 MG immediate release tablet, Take 5 mg by mouth every 4 (four) hours as needed for severe pain (pain score 7-10)., Disp: , Rfl:    sertraline (ZOLOFT) 50 MG tablet, Take 50 mg by mouth daily., Disp: , Rfl:    sodium chloride 1 g tablet, Take 1 tablet (1 g total) by mouth 2 (two) times daily with a meal., Disp: 60 tablet, Rfl: 0   traZODone (DESYREL) 50 MG tablet, Take 0.5 mg by mouth at bedtime., Disp: , Rfl:    carboxymethylcellulose (REFRESH PLUS) 0.5 % SOLN, Place 1 drop into both eyes 4 (four) times daily. (Patient not taking: Reported on 06/18/2023), Disp: , Rfl:    HYDROcodone-acetaminophen (NORCO) 7.5-325 MG tablet, Take 1 tablet by mouth every 6 (six) hours as needed for severe pain (pain score 7-10). (Patient not taking: Reported on 06/18/2023), Disp: 10 tablet, Rfl: 0   Physical exam:  Vitals:   06/18/23 1429  BP: 121/62  Pulse: 74  Resp: 18  Temp: 97.7 F (36.5 C)  TempSrc: Tympanic  SpO2: 100%  Weight: 180 lb (81.6 kg)  Height: 6' (1.829 m)   Physical Exam Constitutional:      Comments: He is sitting in a wheelchair and appears in no acute distress.  He is  s/p right BKA  Cardiovascular:     Rate and Rhythm: Normal rate and regular rhythm.     Heart sounds: Normal heart sounds.  Pulmonary:     Effort: Pulmonary effort is normal.     Breath sounds: Normal breath sounds.   Abdominal:     General: Bowel sounds are normal.     Palpations: Abdomen is soft.  Musculoskeletal:     Right lower leg: No edema.     Left lower leg: No edema.  Skin:    General: Skin is warm and dry.  Neurological:     Mental Status: He is alert and oriented to person, place, and time.           Latest Ref Rng & Units 06/18/2023    3:02 PM  CMP  Glucose 70 - 99 mg/dL 130   BUN 8 - 23 mg/dL 16   Creatinine 8.65 - 1.24 mg/dL 7.84   Sodium 696 - 295 mmol/L 136   Potassium 3.5 - 5.1 mmol/L 3.7   Chloride 98 - 111 mmol/L 104   CO2 22 - 32 mmol/L 23   Calcium 8.9 - 10.3 mg/dL 7.9   Total Protein 6.5 - 8.1 g/dL 7.4   Total Bilirubin 0.0 - 1.2 mg/dL 0.2   Alkaline Phos 38 - 126 U/L 84   AST 15 - 41 U/L 17   ALT 0 - 44 U/L 15       Latest Ref Rng & Units 06/18/2023    3:02 PM  CBC  WBC 4.0 - 10.5 K/uL 10.1   Hemoglobin 13.0 - 17.0 g/dL 7.8   Hematocrit 28.4 - 52.0 % 25.4   Platelets 150 - 400 K/uL 378     No images are attached to the encounter.  No results found.  Assessment and plan- Patient is a 78 y.o. male with multiple comorbidities referred for normocytic anemia  Normocytic anemia: Component of anemia of chronic disease.  I will do a complete anemia workup today including CBC with differential CMP ferritin and iron studies B12 folate TSH reticulocyte count haptoglobin LDH myeloma panel and serum free light chains.  I will see him back in 2 weeks time to discuss the results of blood work and further management   Thank you for this kind referral and the opportunity to participate in the care of this  Patient   Visit Diagnosis 1. Normocytic anemia     Dr. Owens Shark, MD, MPH [facility] [phone] 06/19/2023

## 2023-06-20 LAB — HAPTOGLOBIN: Haptoglobin: 441 mg/dL — ABNORMAL HIGH (ref 34–355)

## 2023-06-21 ENCOUNTER — Telehealth: Payer: Self-pay

## 2023-06-21 ENCOUNTER — Other Ambulatory Visit: Payer: Self-pay

## 2023-06-21 LAB — KAPPA/LAMBDA LIGHT CHAINS
Kappa free light chain: 180 mg/L — ABNORMAL HIGH (ref 3.3–19.4)
Kappa, lambda light chain ratio: 1.69 — ABNORMAL HIGH (ref 0.26–1.65)
Lambda free light chains: 106.6 mg/L — ABNORMAL HIGH (ref 5.7–26.3)

## 2023-06-21 MED ORDER — FOLIC ACID 1 MG PO TABS: 1.0000 mg | ORAL_TABLET | Freq: Every day | ORAL | 1 refills | Status: AC

## 2023-06-21 NOTE — Telephone Encounter (Signed)
 I spoke to the nurse at [employer], Miguel Harvey she received VO for folate 1mg  sent to their pharmacy. Explained Iron infusion side effects and what to expect. She is aware scheduler will reach out to make appointments.   I also called daughter Miguel Harvey at [phone] to inform her as well(levels, PO Rx & IV infusions)

## 2023-06-21 NOTE — Telephone Encounter (Signed)
-----   Message from Creig Hines sent at 06/19/2023  8:23 AM EDT ----- Miguel Harvey- he needs prescription for folate 1 mg po daily Megan- please arrange to start iv iron. 1st dose when he sees me

## 2023-06-22 LAB — MULTIPLE MYELOMA PANEL, SERUM
Albumin SerPl Elph-Mcnc: 2.4 g/dL — ABNORMAL LOW (ref 2.9–4.4)
Albumin/Glob SerPl: 0.6 — ABNORMAL LOW (ref 0.7–1.7)
Alpha 1: 0.4 g/dL (ref 0.0–0.4)
Alpha2 Glob SerPl Elph-Mcnc: 1.1 g/dL — ABNORMAL HIGH (ref 0.4–1.0)
B-Globulin SerPl Elph-Mcnc: 1.1 g/dL (ref 0.7–1.3)
Gamma Glob SerPl Elph-Mcnc: 2 g/dL — ABNORMAL HIGH (ref 0.4–1.8)
Globulin, Total: 4.5 g/dL — ABNORMAL HIGH (ref 2.2–3.9)
IgA: 298 mg/dL (ref 61–437)
IgG (Immunoglobin G), Serum: 2449 mg/dL — ABNORMAL HIGH (ref 603–1613)
IgM (Immunoglobulin M), Srm: 79 mg/dL (ref 15–143)
M Protein SerPl Elph-Mcnc: 0.4 g/dL — ABNORMAL HIGH
Total Protein ELP: 6.9 g/dL (ref 6.0–8.5)

## 2023-07-07 ENCOUNTER — Inpatient Hospital Stay

## 2023-07-07 ENCOUNTER — Encounter: Payer: Self-pay | Admitting: Oncology

## 2023-07-07 ENCOUNTER — Inpatient Hospital Stay: Attending: Oncology | Admitting: Oncology

## 2023-07-07 VITALS — BP 146/80

## 2023-07-07 VITALS — BP 142/76 | HR 68 | Temp 97.7°F | Resp 16

## 2023-07-07 DIAGNOSIS — N1831 Chronic kidney disease, stage 3a: Secondary | ICD-10-CM | POA: Insufficient documentation

## 2023-07-07 DIAGNOSIS — F17210 Nicotine dependence, cigarettes, uncomplicated: Secondary | ICD-10-CM | POA: Diagnosis not present

## 2023-07-07 DIAGNOSIS — I509 Heart failure, unspecified: Secondary | ICD-10-CM | POA: Diagnosis not present

## 2023-07-07 DIAGNOSIS — Z7902 Long term (current) use of antithrombotics/antiplatelets: Secondary | ICD-10-CM | POA: Diagnosis not present

## 2023-07-07 DIAGNOSIS — Z7982 Long term (current) use of aspirin: Secondary | ICD-10-CM | POA: Diagnosis not present

## 2023-07-07 DIAGNOSIS — D509 Iron deficiency anemia, unspecified: Secondary | ICD-10-CM | POA: Insufficient documentation

## 2023-07-07 DIAGNOSIS — D508 Other iron deficiency anemias: Secondary | ICD-10-CM | POA: Diagnosis not present

## 2023-07-07 DIAGNOSIS — Z8249 Family history of ischemic heart disease and other diseases of the circulatory system: Secondary | ICD-10-CM | POA: Diagnosis not present

## 2023-07-07 DIAGNOSIS — Z833 Family history of diabetes mellitus: Secondary | ICD-10-CM | POA: Diagnosis not present

## 2023-07-07 DIAGNOSIS — I130 Hypertensive heart and chronic kidney disease with heart failure and stage 1 through stage 4 chronic kidney disease, or unspecified chronic kidney disease: Secondary | ICD-10-CM | POA: Diagnosis not present

## 2023-07-07 DIAGNOSIS — Z8674 Personal history of sudden cardiac arrest: Secondary | ICD-10-CM | POA: Diagnosis not present

## 2023-07-07 DIAGNOSIS — D649 Anemia, unspecified: Secondary | ICD-10-CM

## 2023-07-07 DIAGNOSIS — E785 Hyperlipidemia, unspecified: Secondary | ICD-10-CM | POA: Insufficient documentation

## 2023-07-07 DIAGNOSIS — Z79899 Other long term (current) drug therapy: Secondary | ICD-10-CM | POA: Insufficient documentation

## 2023-07-07 DIAGNOSIS — E11621 Type 2 diabetes mellitus with foot ulcer: Secondary | ICD-10-CM | POA: Diagnosis not present

## 2023-07-07 DIAGNOSIS — J449 Chronic obstructive pulmonary disease, unspecified: Secondary | ICD-10-CM | POA: Insufficient documentation

## 2023-07-07 DIAGNOSIS — Z89511 Acquired absence of right leg below knee: Secondary | ICD-10-CM | POA: Insufficient documentation

## 2023-07-07 DIAGNOSIS — R768 Other specified abnormal immunological findings in serum: Secondary | ICD-10-CM | POA: Insufficient documentation

## 2023-07-07 DIAGNOSIS — D631 Anemia in chronic kidney disease: Secondary | ICD-10-CM | POA: Diagnosis present

## 2023-07-07 DIAGNOSIS — E1122 Type 2 diabetes mellitus with diabetic chronic kidney disease: Secondary | ICD-10-CM | POA: Diagnosis not present

## 2023-07-07 DIAGNOSIS — I2510 Atherosclerotic heart disease of native coronary artery without angina pectoris: Secondary | ICD-10-CM | POA: Diagnosis not present

## 2023-07-07 MED ORDER — SODIUM CHLORIDE 0.9 % IV SOLN
510.0000 mg | INTRAVENOUS | Status: AC
  Administered 2023-07-07: 510 mg via INTRAVENOUS
  Filled 2023-07-07: qty 17

## 2023-07-07 MED ORDER — SODIUM CHLORIDE 0.9 % IV SOLN
Freq: Once | INTRAVENOUS | Status: AC
  Filled 2023-07-07: qty 250

## 2023-07-07 NOTE — Patient Instructions (Signed)

## 2023-07-07 NOTE — Progress Notes (Signed)
 Patient couldn't tell me why he was here, but he said that he didn't have any new questions or concerns for the doctor today.

## 2023-07-10 ENCOUNTER — Encounter: Payer: Self-pay | Admitting: Oncology

## 2023-07-10 NOTE — Progress Notes (Signed)
 Hematology/Oncology Consult note [facility]  [phone] [phone]  Patient Care Team: Amin, Saad, MD as PCP - General (Internal Medicine) Devorah Fonder, MD as PCP - Cardiology (Cardiology) Avonne Boettcher, MD as Consulting Physician (Oncology)   Name of the patient: Miguel Harvey  [id number]  09-16-1945   Date of visit: 07/10/23  Diagnosis- normocytic anemia likely due to chronic disease and component of iron deficiency  Chief complaint/ Reason for visit- discuss results of bloodwork  Heme/Onc history: Patient is a 78 year old male with a past medical history significant for hypertension hyperlipidemia type 2 diabetes COPD CAD stage III CKD, right BKA tobacco abuse who is a resident of nursing home and has been referred to me for anemia.  He was discharged from the hospital in February 2025 for septic arthritis and chronic left foot ulcer requiring antibiotics for 3 weeks.  At baseline patient's hemoglobin is about 10 and it drifted down to 8.8 during hospitalization.  In late March 2025 was noted to have a hemoglobin of 7.2.    Interval history-no acute events since last visit.  ECOG PS- 3 Pain scale- 0   Review of systems- Review of Systems  Constitutional:  Positive for malaise/fatigue. Negative for chills, fever and weight loss.  HENT:  Negative for congestion, ear discharge and nosebleeds.   Eyes:  Negative for blurred vision.  Respiratory:  Negative for cough, hemoptysis, sputum production, shortness of breath and wheezing.   Cardiovascular:  Negative for chest pain, palpitations, orthopnea and claudication.  Gastrointestinal:  Negative for abdominal pain, blood in stool, constipation, diarrhea, heartburn, melena, nausea and vomiting.  Genitourinary:  Negative for dysuria, flank pain, frequency, hematuria and urgency.  Musculoskeletal:  Negative for back pain, joint pain and myalgias.  Skin:  Negative for rash.   Neurological:  Negative for dizziness, tingling, focal weakness, seizures, weakness and headaches.  Endo/Heme/Allergies:  Does not bruise/bleed easily.  Psychiatric/Behavioral:  Negative for depression and suicidal ideas. The patient does not have insomnia.       Allergies  Allergen Reactions   Spironolactone     Hyperkalemia (currently takes spironolactone)     Past Medical History:  Diagnosis Date   Abnormal posture    CHF (congestive heart failure) (HCC)    Chronic kidney disease    CKD   stage 2   Complication of anesthesia    heart stopped x2 during toe amputation   COPD (chronic obstructive pulmonary disease) (HCC)    Coronary artery disease, non-occlusive    a. LHC 05/2011: 10-20% ostial LAD stenosis   Depression    Diabetes mellitus    Grade I diastolic dysfunction    HLD (hyperlipidemia)    Hypertension    Muscle weakness    Neuromuscular disorder (HCC)    neuropathy   NICM (nonischemic cardiomyopathy) (HCC)    a. TTE 2/213: EF 50-55%, mild LVH, normal wall motion, Gr2DD, mildly dilated LA; b. TTE 2/13: EF 40-45%, mild concentric LVH, global HK, no evidence of LV thrombus; c. TTE 4/19: EF 40%, diffuse HK, Gr2DD, mild MR, mildly dilated LA, RV cavity size normal w/ normal RVSF, PASP 63; d. TTE 4/19: EF 25-30%, diffuse HK, mild LVH, mild MR, mild PASP; e. TTE 5/19: EF 20-25%, diffuse HK, moderate MR,   NSVT (nonsustained ventricular tachycardia) (HCC)    PAD (peripheral artery disease) (HCC)    a. s/p right BKA s/p  recent balloon angioplasty fo the left peroneal artery, left posterior tibial artery,  tibioperoneal trunk, and stenting to the distal left SFA in 07/2017   PEA (Pulseless electrical activity) (HCC)    heart stopped beating during surgery for amputation of toes     Past Surgical History:  Procedure Laterality Date   AMPUTATION Right 07/02/2017   Procedure: RIGHT BELOW KNEE AMPUTATION;  Surgeon: Timothy Ford, MD;  Location: [facility] OR;  Service: Orthopedics;   Laterality: Right;   AMPUTATION TOE Left 05/11/2021   Procedure: LEFT GREAT  TOE AMPUTATION;  Surgeon: Anell Baptist, DPM;  Location: [facility] ORS;  Service: Podiatry;  Laterality: Left;   CARDIAC CATHETERIZATION     CATARACT EXTRACTION W/PHACO Left 11/08/2018   Procedure: CATARACT EXTRACTION PHACO AND INTRAOCULAR LENS PLACEMENT (IOC)  LEFT VISION BLUE;  Surgeon: Ola Berger, MD;  Location: [facility] ORS;  Service: Ophthalmology;  Laterality: Left;  US  03:38.9 CDE 42.37 Fluid pack lot # [id number]   CATARACT EXTRACTION W/PHACO Right 03/25/2023   Procedure: CATARACT EXTRACTION PHACO AND INTRAOCULAR LENS PLACEMENT (IOC) RIGHT DIABETIC MALYUGIN OMIDRIA 24.82 02:08.6;  Surgeon: Trudi Fus, MD;  Location: [facility];  Service: Ophthalmology;  Laterality: Right;   cyst removal from  right leg about 6 years ago     IRRIGATION AND DEBRIDEMENT FOOT Left 08/10/2017   Procedure: IRRIGATION AND DEBRIDEMENT FOOT;  Surgeon: Anell Baptist, DPM;  Location: [facility] ORS;  Service: Podiatry;  Laterality: Left;   IRRIGATION AND DEBRIDEMENT FOOT Left 05/14/2023   Procedure: IRRIGATION AND DEBRIDEMENT FOOT;  Surgeon: Anell Baptist, DPM;  Location: [facility] ORS;  Service: Orthopedics/Podiatry;  Laterality: Left;   KNEE ARTHROSCOPY Left 05/11/2023   Procedure: ARTHROSCOPIC IRRIGATION AND DEBRIDEMENT LEFT KNEE, SYNOVECTOMY;  Surgeon: Venus Ginsberg, MD;  Location: [facility] ORS;  Service: Orthopedics;  Laterality: Left;   LEFT HEART CATHETERIZATION WITH CORONARY ANGIOGRAM N/A 05/11/2011   Procedure: LEFT HEART CATHETERIZATION WITH CORONARY ANGIOGRAM;  Surgeon: Millicent Ally, MD;  Location: [facility] CATH LAB;  Service: Cardiovascular;  Laterality: N/A;   LOWER EXTREMITY ANGIOGRAPHY Left 08/13/2017   Procedure: Lower Extremity Angiography and PICC line placement;  Surgeon: Celso College, MD;  Location: [facility] INVASIVE CV LAB;  Service: Cardiovascular;  Laterality: Left;   ruptured navel     repair of   TOE AMPUTATION      not sure of when this surgery occured, but his heart stopped beating x 2 during surgery    Social History   Socioeconomic History   Marital status: Single    Spouse name: danielle .... daughter   Number of children: Not on file   Years of education: Not on file   Highest education level: Not on file  Occupational History   Not on file  Tobacco Use   Smoking status: Every Day    Current packs/day: 0.00    Types: Cigarettes   Smokeless tobacco: Former    Quit date: 05/07/2011   Tobacco comments:    3 cigs per day (previous hx of smoking 1 PPD)  Vaping Use   Vaping status: Never Used  Substance and Sexual Activity   Alcohol use: Not Currently    Alcohol/week: 6.0 standard drinks of alcohol    Types: 6 Cans of beer per week   Drug use: Never   Sexual activity: Not Currently  Other Topics Concern   Not on file  Social History Narrative   Patient resides at [facility].   Social Drivers of Health   Financial Resource Strain: Patient Declined (06/08/2023)   Received from [facility]  Overall Financial Resource Strain (CARDIA)    Difficulty of Paying Living Expenses: Patient declined  Food Insecurity: Patient Declined (06/08/2023)   Received from [facility]   Hunger Vital Sign    Worried About Running Out of Food in the Last Year: Patient declined    Ran Out of Food in the Last Year: Patient declined  Transportation Needs: Patient Declined (06/08/2023)   Received from [facility] - Transportation    In the past 12 months, has lack of transportation kept you from medical appointments or from getting medications?: Patient declined    Lack of Transportation (Non-Medical): Patient declined  Physical Activity: Inactive (08/05/2017)   Exercise Vital Sign    Days of Exercise per Week: 0 days    Minutes of Exercise per Session: 0 min  Stress: No Stress Concern Present (08/05/2017)   [employer] of  Occupational Health - Occupational Stress Questionnaire    Feeling of Stress : Not at all  Social Connections: Socially Isolated (05/11/2023)   Social Connection and Isolation Panel [NHANES]    Frequency of Communication with Friends and Family: Patient declined    Frequency of Social Gatherings with Friends and Family: Three times a week    Attends Religious Services: Never    Active Member of Clubs or Organizations: No    Attends Banker Meetings: Never    Marital Status: Never married  Intimate Partner Violence: Not At Risk (05/11/2023)   Humiliation, Afraid, Rape, and Kick questionnaire    Fear of Current or Ex-Partner: No    Emotionally Abused: No    Physically Abused: No    Sexually Abused: No    Family History  Problem Relation Age of Onset   CAD Other    Diabetes Other      Current Outpatient Medications:    aspirin EC 81 MG EC tablet, Take 1 tablet (81 mg total) by mouth daily. (Patient taking differently: Take 81 mg by mouth daily. D/c'd on 05/11/23 at 1858 per [facility]), Disp: 30 tablet, Rfl: 0   atorvastatin (LIPITOR) 20 MG tablet, Take 20 mg by mouth every evening., Disp: , Rfl:    carboxymethylcellulose (REFRESH PLUS) 0.5 % SOLN, Place 1 drop into both eyes 4 (four) times daily. (Patient not taking: Reported on 06/18/2023), Disp: , Rfl:    carvedilol (COREG) 6.25 MG tablet, Take 6.25 mg by mouth 2 (two) times daily with a meal., Disp: , Rfl:    clopidogrel (PLAVIX) 75 MG tablet, Take 1 tablet (75 mg total) by mouth daily. (Patient taking differently: Take 75 mg by mouth daily. D/c'd on 05/11/23 per [facility]), Disp: 30 tablet, Rfl: 0   Continuous Glucose Receiver (DEXCOM G7 RECEIVER) DEVI, 1 Device by Does not apply route continuous., Disp: 1 each, Rfl: 0   Continuous Glucose Sensor (DEXCOM G7 SENSOR) MISC, 1 Device by Does not apply route continuous., Disp: 9 each, Rfl: 0   Cyanocobalamin (B-12 PO), Take 500 mcg by mouth daily at 2 am., Disp: , Rfl:    folic acid  (FOLVITE) 1 MG tablet, Take 1 tablet (1 mg total) by mouth daily., Disp: 30 tablet, Rfl: 1   furosemide (LASIX) 20 MG tablet, Take 20 mg by mouth daily. D/c'd on 05/11/23 per [facility], Disp: , Rfl:    guaifenesin (ROBITUSSIN) 100 MG/5ML syrup, Take 10 mLs by mouth every 8 (eight) hours as needed for cough., Disp: , Rfl:    HYDROcodone-acetaminophen (NORCO) 7.5-325 MG tablet, Take 1  tablet by mouth every 6 (six) hours as needed for severe pain (pain score 7-10). (Patient not taking: Reported on 06/18/2023), Disp: 10 tablet, Rfl: 0   ibuprofen (ADVIL) 600 MG tablet, Take 600 mg by mouth every 6 (six) hours as needed., Disp: , Rfl:    insulin glargine, 2 Unit Dial, (TOUJEO MAX SOLOSTAR) 300 UNIT/ML Solostar Pen, Inject 20 Units into the skin daily at 6 (six) AM., Disp: , Rfl:    Insulin Lispro-aabc (LYUMJEV) 100 UNIT/ML SOLN, Inject 8 Units as directed daily at 6 (six) AM. BASED ON SLIDING SCALE, Disp: , Rfl:    ipratropium-albuterol (DUONEB) 0.5-2.5 (3) MG/3ML SOLN, Take 3 mLs by nebulization every 6 (six) hours as needed., Disp: , Rfl:    latanoprost (XALATAN) 0.005 % ophthalmic solution, Place 1 drop into both eyes at bedtime., Disp: , Rfl:    linagliptin (TRADJENTA) 5 MG TABS tablet, Take 5 mg by mouth daily., Disp: , Rfl:    Magnesium 400 MG TABS, Take 400 mg by mouth 2 (two) times daily. , Disp: , Rfl:    Multiple Vitamin (MULTIVITAMIN WITH MINERALS) TABS tablet, Take 1 tablet by mouth daily., Disp: , Rfl:    oxyCODONE (OXY IR/ROXICODONE) 5 MG immediate release tablet, Take 5 mg by mouth every 4 (four) hours as needed for severe pain (pain score 7-10)., Disp: , Rfl:    sertraline (ZOLOFT) 50 MG tablet, Take 50 mg by mouth daily., Disp: , Rfl:    sodium chloride 1 g tablet, Take 1 tablet (1 g total) by mouth 2 (two) times daily with a meal., Disp: 60 tablet, Rfl: 0   traZODone (DESYREL) 50 MG tablet, Take 0.5 mg by mouth at bedtime., Disp: , Rfl:  No current facility-administered medications for this  visit.  Facility-Administered Medications Ordered in Other Visits:    ferumoxytol (FERAHEME) 510 mg in sodium chloride 0.9 % 100 mL IVPB, 510 mg, Intravenous, Weekly, Avonne Boettcher, MD, Last Rate: 468 mL/hr at 07/07/23 1607, 510 mg at 07/07/23 1607  Physical exam:  Vitals:   07/07/23 1507  BP: (!) 142/76  Pulse: 68  Resp: 16  Temp: 97.7 F (36.5 C)  TempSrc: Tympanic  SpO2: 96%   Physical Exam Cardiovascular:     Rate and Rhythm: Normal rate and regular rhythm.     Heart sounds: Normal heart sounds.  Pulmonary:     Effort: Pulmonary effort is normal.     Breath sounds: Normal breath sounds.  Abdominal:     General: Bowel sounds are normal.     Palpations: Abdomen is soft.  Skin:    General: Skin is warm and dry.  Neurological:     Mental Status: He is alert and oriented to person, place, and time.      I have personally reviewed labs listed below:    Latest Ref Rng & Units 06/18/2023    3:02 PM  CMP  Glucose 70 - 99 mg/dL 161   BUN 8 - 23 mg/dL 16   Creatinine 0.96 - 1.24 mg/dL 0.45   Sodium 409 - 811 mmol/L 136   Potassium 3.5 - 5.1 mmol/L 3.7   Chloride 98 - 111 mmol/L 104   CO2 22 - 32 mmol/L 23   Calcium 8.9 - 10.3 mg/dL 7.9   Total Protein 6.5 - 8.1 g/dL 7.4   Total Bilirubin 0.0 - 1.2 mg/dL 0.2   Alkaline Phos 38 - 126 U/L 84   AST 15 - 41 U/L 17   ALT  0 - 44 U/L 15       Latest Ref Rng & Units 06/18/2023    3:02 PM  CBC  WBC 4.0 - 10.5 K/uL 10.1   Hemoglobin 13.0 - 17.0 g/dL 7.8   Hematocrit 16.1 - 52.0 % 25.4   Platelets 150 - 400 K/uL 378      Assessment and plan- Patient is a 78 y.o. male here to discuss results of blood work  Results of anemia workup from 06/18/2023 were as follows: CBC showed H&H of 7.8/25.4 with MCV of 87.  Folate was low at 5.4 and B12 was normal.  Iron studies showed low iron saturation of 15% and TIBC of 189.  Ferritin level was normal at 225.  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  Haptoglobin  elevated.  Serum free light chain ratio showed both kappa and lambda light chains that were elevated with a light chain ratio 1.69.  I suspect patient has anemia of chronic disease but given his low iron saturation and difficulty to interpret with ferritin in the presence of recent infection and antibiotic use and will give him a trial of IV iron.  Discussed the side effects of IV iron including all but not limited to possible risk of infusion anaphylactic reaction.  Patient understands and agrees to proceed as planned.  CBC ferritin and iron studies in 2 months and I will see him thereafter  If there is no improvement in his hemoglobin over the next few months I could consider doing a bone marrow biopsy at that time   Visit Diagnosis 1. Normocytic anemia   2. Other iron deficiency anemia      Dr. Seretha Dance, MD, MPH [facility] [phone] 07/10/2023 7:41 PM

## 2023-07-14 ENCOUNTER — Inpatient Hospital Stay

## 2023-07-15 ENCOUNTER — Inpatient Hospital Stay

## 2023-07-15 VITALS — BP 126/72 | HR 65 | Temp 97.2°F | Resp 16

## 2023-07-15 DIAGNOSIS — D508 Other iron deficiency anemias: Secondary | ICD-10-CM

## 2023-07-15 DIAGNOSIS — I130 Hypertensive heart and chronic kidney disease with heart failure and stage 1 through stage 4 chronic kidney disease, or unspecified chronic kidney disease: Secondary | ICD-10-CM | POA: Diagnosis not present

## 2023-07-15 MED ORDER — SODIUM CHLORIDE 0.9 % IV SOLN
Freq: Once | INTRAVENOUS | Status: AC
Start: 2023-07-15 — End: 2023-07-15
  Filled 2023-07-15: qty 250

## 2023-07-15 MED ORDER — SODIUM CHLORIDE 0.9 % IV SOLN
510.0000 mg | INTRAVENOUS | Status: DC
  Administered 2023-07-15: 510 mg via INTRAVENOUS
  Filled 2023-07-15: qty 510

## 2023-09-08 ENCOUNTER — Inpatient Hospital Stay (HOSPITAL_BASED_OUTPATIENT_CLINIC_OR_DEPARTMENT_OTHER): Admitting: Oncology

## 2023-09-08 ENCOUNTER — Inpatient Hospital Stay: Attending: Oncology

## 2023-09-08 ENCOUNTER — Encounter: Payer: Self-pay | Admitting: Oncology

## 2023-09-08 DIAGNOSIS — E785 Hyperlipidemia, unspecified: Secondary | ICD-10-CM | POA: Insufficient documentation

## 2023-09-08 DIAGNOSIS — F17210 Nicotine dependence, cigarettes, uncomplicated: Secondary | ICD-10-CM | POA: Diagnosis not present

## 2023-09-08 DIAGNOSIS — I2510 Atherosclerotic heart disease of native coronary artery without angina pectoris: Secondary | ICD-10-CM | POA: Insufficient documentation

## 2023-09-08 DIAGNOSIS — F32A Depression, unspecified: Secondary | ICD-10-CM | POA: Diagnosis not present

## 2023-09-08 DIAGNOSIS — Z8249 Family history of ischemic heart disease and other diseases of the circulatory system: Secondary | ICD-10-CM | POA: Insufficient documentation

## 2023-09-08 DIAGNOSIS — I509 Heart failure, unspecified: Secondary | ICD-10-CM | POA: Insufficient documentation

## 2023-09-08 DIAGNOSIS — E1122 Type 2 diabetes mellitus with diabetic chronic kidney disease: Secondary | ICD-10-CM | POA: Diagnosis not present

## 2023-09-08 DIAGNOSIS — D631 Anemia in chronic kidney disease: Secondary | ICD-10-CM | POA: Insufficient documentation

## 2023-09-08 DIAGNOSIS — D649 Anemia, unspecified: Secondary | ICD-10-CM

## 2023-09-08 DIAGNOSIS — I130 Hypertensive heart and chronic kidney disease with heart failure and stage 1 through stage 4 chronic kidney disease, or unspecified chronic kidney disease: Secondary | ICD-10-CM | POA: Diagnosis not present

## 2023-09-08 DIAGNOSIS — N1831 Chronic kidney disease, stage 3a: Secondary | ICD-10-CM | POA: Diagnosis present

## 2023-09-08 DIAGNOSIS — D509 Iron deficiency anemia, unspecified: Secondary | ICD-10-CM | POA: Insufficient documentation

## 2023-09-08 DIAGNOSIS — J449 Chronic obstructive pulmonary disease, unspecified: Secondary | ICD-10-CM | POA: Diagnosis not present

## 2023-09-08 DIAGNOSIS — Z89511 Acquired absence of right leg below knee: Secondary | ICD-10-CM | POA: Diagnosis not present

## 2023-09-08 DIAGNOSIS — Z833 Family history of diabetes mellitus: Secondary | ICD-10-CM | POA: Diagnosis not present

## 2023-09-08 DIAGNOSIS — Z7902 Long term (current) use of antithrombotics/antiplatelets: Secondary | ICD-10-CM | POA: Insufficient documentation

## 2023-09-08 DIAGNOSIS — Z79899 Other long term (current) drug therapy: Secondary | ICD-10-CM | POA: Diagnosis not present

## 2023-09-08 LAB — CBC WITH DIFFERENTIAL (CANCER CENTER ONLY)
Abs Immature Granulocytes: 0.02 10*3/uL (ref 0.00–0.07)
Basophils Absolute: 0 10*3/uL (ref 0.0–0.1)
Basophils Relative: 1 %
Eosinophils Absolute: 0.2 10*3/uL (ref 0.0–0.5)
Eosinophils Relative: 2 %
HCT: 37 % — ABNORMAL LOW (ref 39.0–52.0)
Hemoglobin: 11.7 g/dL — ABNORMAL LOW (ref 13.0–17.0)
Immature Granulocytes: 0 %
Lymphocytes Relative: 38 %
Lymphs Abs: 2.8 10*3/uL (ref 0.7–4.0)
MCH: 27 pg (ref 26.0–34.0)
MCHC: 31.6 g/dL (ref 30.0–36.0)
MCV: 85.3 fL (ref 80.0–100.0)
Monocytes Absolute: 0.7 10*3/uL (ref 0.1–1.0)
Monocytes Relative: 9 %
Neutro Abs: 3.8 10*3/uL (ref 1.7–7.7)
Neutrophils Relative %: 50 %
Platelet Count: 208 10*3/uL (ref 150–400)
RBC: 4.34 MIL/uL (ref 4.22–5.81)
RDW: 17.1 % — ABNORMAL HIGH (ref 11.5–15.5)
WBC Count: 7.6 10*3/uL (ref 4.0–10.5)
nRBC: 0 % (ref 0.0–0.2)

## 2023-09-08 LAB — FERRITIN: Ferritin: 147 ng/mL (ref 24–336)

## 2023-09-08 LAB — IRON AND TIBC
Iron: 64 ug/dL (ref 45–182)
Saturation Ratios: 22 % (ref 17.9–39.5)
TIBC: 288 ug/dL (ref 250–450)
UIBC: 224 ug/dL

## 2023-09-08 NOTE — Progress Notes (Signed)
 Hematology/Oncology Consult note [facility]  [phone] [phone]  Patient Care Team: Amin, Saad, MD as PCP - General (Internal Medicine) Devorah Fonder, MD as PCP - Cardiology (Cardiology) Avonne Boettcher, MD as Consulting Physician (Oncology)   Name of the patient: Miguel Harvey  [id number]  [birth year]   Date of visit: 09/08/23  Diagnosis-normocytic anemia likely secondary to chronic disease as well as component of iron deficiency  Chief complaint/ Reason for visit-routine follow-up of anemia  Heme/Onc history: Patient is a 78 year old male with a past medical history significant for hypertension hyperlipidemia type 2 diabetes COPD CAD stage III CKD, right BKA tobacco abuse who is a resident of nursing home and has been referred to me for anemia.  He was discharged from the hospital in February 2025 for septic arthritis and chronic left foot ulcer requiring antibiotics for 3 weeks.  At baseline patient's hemoglobin is about 10 and it drifted down to 8.8 during hospitalization.  In late March 2025 was noted to have a hemoglobin of 7.2.   Labs from April 2025 were suggestive of relative iron deficiency given that iron saturation was low at 15% although ferritin levels were normal at 225.  Patient received IV iron  Interval history-patient reports improvement in his energy levels after receiving IV iron.  Denies any new issues today  ECOG PS- 2 Pain scale- 0   Review of systems- Review of Systems  Constitutional:  Negative for chills, fever, malaise/fatigue and weight loss.  HENT:  Negative for congestion, ear discharge and nosebleeds.   Eyes:  Negative for blurred vision.  Respiratory:  Negative for cough, hemoptysis, sputum production, shortness of breath and wheezing.   Cardiovascular:  Negative for chest pain, palpitations, orthopnea and claudication.  Gastrointestinal:  Negative for abdominal pain, blood in stool, constipation,  diarrhea, heartburn, melena, nausea and vomiting.  Genitourinary:  Negative for dysuria, flank pain, frequency, hematuria and urgency.  Musculoskeletal:  Negative for back pain, joint pain and myalgias.  Skin:  Negative for rash.  Neurological:  Negative for dizziness, tingling, focal weakness, seizures, weakness and headaches.  Endo/Heme/Allergies:  Does not bruise/bleed easily.  Psychiatric/Behavioral:  Negative for depression and suicidal ideas. The patient does not have insomnia.       Allergies  Allergen Reactions   Spironolactone      Hyperkalemia (currently takes spironolactone )     Past Medical History:  Diagnosis Date   Abnormal posture    CHF (congestive heart failure) (HCC)    Chronic kidney disease    CKD   stage 2   Complication of anesthesia    heart stopped x2 during toe amputation   COPD (chronic obstructive pulmonary disease) (HCC)    Coronary artery disease, non-occlusive    a. LHC 05/2011: 10-20% ostial LAD stenosis   Depression    Diabetes mellitus    Grade I diastolic dysfunction    HLD (hyperlipidemia)    Hypertension    Muscle weakness    Neuromuscular disorder (HCC)    neuropathy   NICM (nonischemic cardiomyopathy) (HCC)    a. TTE 2/213: EF 50-55%, mild LVH, normal wall motion, Gr2DD, mildly dilated LA; b. TTE 2/13: EF 40-45%, mild concentric LVH, global HK, no evidence of LV thrombus; c. TTE 4/19: EF 40%, diffuse HK, Gr2DD, mild MR, mildly dilated LA, RV cavity size normal w/ normal RVSF, PASP 63; d. TTE 4/19: EF 25-30%, diffuse HK, mild LVH, mild MR, mild PASP; e. TTE 5/19: EF 20-25%, diffuse  HK, moderate MR,   NSVT (nonsustained ventricular tachycardia) (HCC)    PAD (peripheral artery disease) (HCC)    a. s/p right BKA s/p  recent balloon angioplasty fo the left peroneal artery, left posterior tibial artery, tibioperoneal trunk, and stenting to the distal left SFA in 07/2017   PEA (Pulseless electrical activity) (HCC)    heart stopped beating during  surgery for amputation of toes     Past Surgical History:  Procedure Laterality Date   AMPUTATION Right 07/02/2017   Procedure: RIGHT BELOW KNEE AMPUTATION;  Surgeon: Timothy Ford, MD;  Location: [facility] OR;  Service: Orthopedics;  Laterality: Right;   AMPUTATION TOE Left 05/11/2021   Procedure: LEFT GREAT  TOE AMPUTATION;  Surgeon: Anell Baptist, DPM;  Location: [facility] ORS;  Service: Podiatry;  Laterality: Left;   CARDIAC CATHETERIZATION     CATARACT EXTRACTION W/PHACO Left 11/08/2018   Procedure: CATARACT EXTRACTION PHACO AND INTRAOCULAR LENS PLACEMENT (IOC)  LEFT VISION BLUE;  Surgeon: Ola Berger, MD;  Location: [facility] ORS;  Service: Ophthalmology;  Laterality: Left;  US  03:38.9 CDE 42.37 Fluid pack lot # [id number]   CATARACT EXTRACTION W/PHACO Right 03/25/2023   Procedure: CATARACT EXTRACTION PHACO AND INTRAOCULAR LENS PLACEMENT (IOC) RIGHT DIABETIC MALYUGIN OMIDRIA  24.82 02:08.6;  Surgeon: Trudi Fus, MD;  Location: [facility];  Service: Ophthalmology;  Laterality: Right;   cyst removal from  right leg about 6 years ago     IRRIGATION AND DEBRIDEMENT FOOT Left 08/10/2017   Procedure: IRRIGATION AND DEBRIDEMENT FOOT;  Surgeon: Anell Baptist, DPM;  Location: [facility] ORS;  Service: Podiatry;  Laterality: Left;   IRRIGATION AND DEBRIDEMENT FOOT Left 05/14/2023   Procedure: IRRIGATION AND DEBRIDEMENT FOOT;  Surgeon: Anell Baptist, DPM;  Location: [facility] ORS;  Service: Orthopedics/Podiatry;  Laterality: Left;   KNEE ARTHROSCOPY Left 05/11/2023   Procedure: ARTHROSCOPIC IRRIGATION AND DEBRIDEMENT LEFT KNEE, SYNOVECTOMY;  Surgeon: Venus Ginsberg, MD;  Location: [facility] ORS;  Service: Orthopedics;  Laterality: Left;   LEFT HEART CATHETERIZATION WITH CORONARY ANGIOGRAM N/A 05/11/2011   Procedure: LEFT HEART CATHETERIZATION WITH CORONARY ANGIOGRAM;  Surgeon: Millicent Ally, MD;  Location: [facility] CATH LAB;  Service: Cardiovascular;  Laterality: N/A;   LOWER EXTREMITY ANGIOGRAPHY Left  08/13/2017   Procedure: Lower Extremity Angiography and PICC line placement;  Surgeon: Celso College, MD;  Location: [facility] INVASIVE CV LAB;  Service: Cardiovascular;  Laterality: Left;   ruptured navel     repair of   TOE AMPUTATION     not sure of when this surgery occured, but his heart stopped beating x 2 during surgery    Social History   Socioeconomic History   Marital status: Single    Spouse name: danielle .... daughter   Number of children: Not on file   Years of education: Not on file   Highest education level: Not on file  Occupational History   Not on file  Tobacco Use   Smoking status: Every Day    Current packs/day: 0.00    Types: Cigarettes   Smokeless tobacco: Former    Quit date: 05/07/2011   Tobacco comments:    3 cigs per day (previous hx of smoking 1 PPD)  Vaping Use   Vaping status: Never Used  Substance and Sexual Activity   Alcohol  use: Not Currently    Alcohol /week: 6.0 standard drinks of alcohol     Types: 6 Cans of beer per week   Drug use: Never   Sexual activity: Not Currently  Other Topics Concern  Not on file  Social History Narrative   Patient resides at [facility].   Social Drivers of Health   Financial Resource Strain: Patient Declined (06/08/2023)   Received from [facility]   Overall Financial Resource Strain (CARDIA)    Difficulty of Paying Living Expenses: Patient declined  Food Insecurity: Patient Declined (06/08/2023)   Received from [facility]   Hunger Vital Sign    Worried About Running Out of Food in the Last Year: Patient declined    Ran Out of Food in the Last Year: Patient declined  Transportation Needs: Patient Declined (06/08/2023)   Received from [facility] - Transportation    In the past 12 months, has lack of transportation kept you from medical appointments or from getting medications?: Patient declined    Lack of Transportation (Non-Medical):  Patient declined  Physical Activity: Inactive (08/05/2017)   Exercise Vital Sign    Days of Exercise per Week: 0 days    Minutes of Exercise per Session: 0 min  Stress: No Stress Concern Present (08/05/2017)   [employer] of Occupational Health - Occupational Stress Questionnaire    Feeling of Stress : Not at all  Social Connections: Socially Isolated (05/11/2023)   Social Connection and Isolation Panel [NHANES]    Frequency of Communication with Friends and Family: Patient declined    Frequency of Social Gatherings with Friends and Family: Three times a week    Attends Religious Services: Never    Active Member of Clubs or Organizations: No    Attends Banker Meetings: Never    Marital Status: Never married  Intimate Partner Violence: Not At Risk (05/11/2023)   Humiliation, Afraid, Rape, and Kick questionnaire    Fear of Current or Ex-Partner: No    Emotionally Abused: No    Physically Abused: No    Sexually Abused: No    Family History  Problem Relation Age of Onset   CAD Other    Diabetes Other      Current Outpatient Medications:    aspirin  EC 81 MG EC tablet, Take 1 tablet (81 mg total) by mouth daily. (Patient taking differently: Take 81 mg by mouth daily. D/c'd on 05/11/23 at 1858 per [facility]), Disp: 30 tablet, Rfl: 0   atorvastatin  (LIPITOR) 20 MG tablet, Take 20 mg by mouth every evening., Disp: , Rfl:    carboxymethylcellulose (REFRESH PLUS) 0.5 % SOLN, Place 1 drop into both eyes 4 (four) times daily., Disp: , Rfl:    carvedilol  (COREG ) 6.25 MG tablet, Take 6.25 mg by mouth 2 (two) times daily with a meal., Disp: , Rfl:    clopidogrel  (PLAVIX ) 75 MG tablet, Take 1 tablet (75 mg total) by mouth daily., Disp: 30 tablet, Rfl: 0   Continuous Glucose Receiver (DEXCOM G7 RECEIVER) DEVI, 1 Device by Does not apply route continuous., Disp: 1 each, Rfl: 0   Continuous Glucose Sensor (DEXCOM G7 SENSOR) MISC, 1 Device by Does not apply route continuous., Disp: 9 each,  Rfl: 0   Cyanocobalamin  (B-12 PO), Take 500 mcg by mouth daily at 2 am., Disp: , Rfl:    folic acid  (FOLVITE ) 1 MG tablet, Take 1 tablet (1 mg total) by mouth daily., Disp: 30 tablet, Rfl: 1   furosemide  (LASIX ) 20 MG tablet, Take 20 mg by mouth daily. D/c'd on 05/11/23 per MAR, Disp: , Rfl:    guaifenesin  (ROBITUSSIN) 100 MG/5ML syrup, Take 10 mLs by mouth every 8 (  eight) hours as needed for cough., Disp: , Rfl:    ipratropium-albuterol  (DUONEB) 0.5-2.5 (3) MG/3ML SOLN, Take 3 mLs by nebulization every 6 (six) hours as needed., Disp: , Rfl:    linagliptin  (TRADJENTA ) 5 MG TABS tablet, Take 5 mg by mouth daily., Disp: , Rfl:    losartan (COZAAR) 25 MG tablet, Take 25 mg by mouth daily., Disp: , Rfl:    Magnesium  400 MG TABS, Take 400 mg by mouth 2 (two) times daily. , Disp: , Rfl:    Multiple Vitamin (MULTIVITAMIN WITH MINERALS) TABS tablet, Take 1 tablet by mouth daily., Disp: , Rfl:    sertraline  (ZOLOFT ) 50 MG tablet, Take 50 mg by mouth daily., Disp: , Rfl:    sodium chloride  1 g tablet, Take 1 tablet (1 g total) by mouth 2 (two) times daily with a meal., Disp: 60 tablet, Rfl: 0   HYDROcodone -acetaminophen  (NORCO) 7.5-325 MG tablet, Take 1 tablet by mouth every 6 (six) hours as needed for severe pain (pain score 7-10). (Patient not taking: Reported on 09/08/2023), Disp: 10 tablet, Rfl: 0   ibuprofen (ADVIL) 600 MG tablet, Take 600 mg by mouth every 6 (six) hours as needed. (Patient not taking: Reported on 09/08/2023), Disp: , Rfl:    insulin  glargine, 2 Unit Dial, (TOUJEO  MAX SOLOSTAR) 300 UNIT/ML Solostar Pen, Inject 20 Units into the skin daily at 6 (six) AM. (Patient not taking: Reported on 09/08/2023), Disp: , Rfl:    Insulin  Lispro-aabc (LYUMJEV) 100 UNIT/ML SOLN, Inject 8 Units as directed daily at 6 (six) AM. BASED ON SLIDING SCALE (Patient not taking: Reported on 09/08/2023), Disp: , Rfl:    latanoprost  (XALATAN ) 0.005 % ophthalmic solution, Place 1 drop into both eyes at bedtime., Disp: ,  Rfl:    oxyCODONE  (OXY IR/ROXICODONE ) 5 MG immediate release tablet, Take 5 mg by mouth every 4 (four) hours as needed for severe pain (pain score 7-10)., Disp: , Rfl:    traZODone  (DESYREL ) 50 MG tablet, Take 0.5 mg by mouth at bedtime. (Patient not taking: Reported on 09/08/2023), Disp: , Rfl:   Physical exam:  Vitals:   09/08/23 1419  BP: 114/63  Pulse: 70  Resp: 16  Temp: (!) 97.5 F (36.4 C)  TempSrc: Tympanic  SpO2: 99%   Physical Exam Constitutional:      Comments: Sitting in a wheelchair and appears in no acute distress  Cardiovascular:     Rate and Rhythm: Normal rate and regular rhythm.     Heart sounds: Normal heart sounds.  Pulmonary:     Effort: Pulmonary effort is normal.     Breath sounds: Normal breath sounds.  Musculoskeletal:     Comments: S/p right BKA  Skin:    General: Skin is warm and dry.  Neurological:     Mental Status: He is alert and oriented to person, place, and time.      I have personally reviewed labs listed below:    Latest Ref Rng & Units 06/18/2023    3:02 PM  CMP  Glucose 70 - 99 mg/dL 829   BUN 8 - 23 mg/dL 16   Creatinine 5.62 - 1.24 mg/dL 1.30   Sodium 865 - 784 mmol/L 136   Potassium 3.5 - 5.1 mmol/L 3.7   Chloride 98 - 111 mmol/L 104   CO2 22 - 32 mmol/L 23   Calcium  8.9 - 10.3 mg/dL 7.9   Total Protein 6.5 - 8.1 g/dL 7.4   Total Bilirubin 0.0 - 1.2 mg/dL 0.2  Alkaline Phos 38 - 126 U/L 84   AST 15 - 41 U/L 17   ALT 0 - 44 U/L 15       Latest Ref Rng & Units 09/08/2023    2:09 PM  CBC  WBC 4.0 - 10.5 K/uL 7.6   Hemoglobin 13.0 - 17.0 g/dL 16.1   Hematocrit 09.6 - 52.0 % 37.0   Platelets 150 - 400 K/uL 208      Assessment and plan- Patient is a 78 y.o. male here for routine follow-up of iron deficiency anemia  Patient's hemoglobin was 7.8 in March 2025 and after receiving IV iron presently his hemoglobin has improved to 11.7.  Iron studies from today are pending.  Repeat CBC ferritin and iron studies in 3 and 6  months and I will see him back in 6 months   Visit Diagnosis 1. Normocytic anemia      Dr. Seretha Dance, MD, MPH [facility] at [facility] [phone] 09/08/2023 3:29 PM

## 2023-12-09 ENCOUNTER — Other Ambulatory Visit: Payer: Self-pay

## 2023-12-09 DIAGNOSIS — D649 Anemia, unspecified: Secondary | ICD-10-CM

## 2023-12-10 ENCOUNTER — Inpatient Hospital Stay: Attending: Oncology

## 2023-12-10 DIAGNOSIS — D631 Anemia in chronic kidney disease: Secondary | ICD-10-CM | POA: Insufficient documentation

## 2023-12-10 DIAGNOSIS — D649 Anemia, unspecified: Secondary | ICD-10-CM

## 2023-12-10 DIAGNOSIS — N1831 Chronic kidney disease, stage 3a: Secondary | ICD-10-CM | POA: Insufficient documentation

## 2023-12-10 LAB — CBC WITH DIFFERENTIAL (CANCER CENTER ONLY)
Abs Immature Granulocytes: 0.04 K/uL (ref 0.00–0.07)
Basophils Absolute: 0.1 K/uL (ref 0.0–0.1)
Basophils Relative: 1 %
Eosinophils Absolute: 0.2 K/uL (ref 0.0–0.5)
Eosinophils Relative: 2 %
HCT: 36.2 % — ABNORMAL LOW (ref 39.0–52.0)
Hemoglobin: 11.7 g/dL — ABNORMAL LOW (ref 13.0–17.0)
Immature Granulocytes: 0 %
Lymphocytes Relative: 33 %
Lymphs Abs: 3.2 K/uL (ref 0.7–4.0)
MCH: 27.7 pg (ref 26.0–34.0)
MCHC: 32.3 g/dL (ref 30.0–36.0)
MCV: 85.6 fL (ref 80.0–100.0)
Monocytes Absolute: 0.7 K/uL (ref 0.1–1.0)
Monocytes Relative: 7 %
Neutro Abs: 5.5 K/uL (ref 1.7–7.7)
Neutrophils Relative %: 57 %
Platelet Count: 203 K/uL (ref 150–400)
RBC: 4.23 MIL/uL (ref 4.22–5.81)
RDW: 16.3 % — ABNORMAL HIGH (ref 11.5–15.5)
WBC Count: 9.7 K/uL (ref 4.0–10.5)
nRBC: 0 % (ref 0.0–0.2)

## 2023-12-10 LAB — IRON AND TIBC
Iron: 80 ug/dL (ref 45–182)
Saturation Ratios: 26 % (ref 17.9–39.5)
TIBC: 307 ug/dL (ref 250–450)
UIBC: 227 ug/dL

## 2023-12-10 LAB — FERRITIN: Ferritin: 149 ng/mL (ref 24–336)

## 2024-03-10 ENCOUNTER — Inpatient Hospital Stay: Admitting: Oncology

## 2024-03-10 ENCOUNTER — Encounter: Payer: Self-pay | Admitting: Oncology

## 2024-03-10 ENCOUNTER — Inpatient Hospital Stay: Attending: Oncology

## 2024-03-10 VITALS — BP 129/68 | HR 71 | Temp 98.6°F | Resp 19 | Wt 201.7 lb

## 2024-03-10 DIAGNOSIS — A419 Sepsis, unspecified organism: Secondary | ICD-10-CM | POA: Insufficient documentation

## 2024-03-10 DIAGNOSIS — Z8249 Family history of ischemic heart disease and other diseases of the circulatory system: Secondary | ICD-10-CM | POA: Insufficient documentation

## 2024-03-10 DIAGNOSIS — Z89422 Acquired absence of other left toe(s): Secondary | ICD-10-CM | POA: Insufficient documentation

## 2024-03-10 DIAGNOSIS — Z604 Social exclusion and rejection: Secondary | ICD-10-CM | POA: Diagnosis not present

## 2024-03-10 DIAGNOSIS — Z7982 Long term (current) use of aspirin: Secondary | ICD-10-CM | POA: Diagnosis not present

## 2024-03-10 DIAGNOSIS — F17210 Nicotine dependence, cigarettes, uncomplicated: Secondary | ICD-10-CM | POA: Diagnosis not present

## 2024-03-10 DIAGNOSIS — Z602 Problems related to living alone: Secondary | ICD-10-CM | POA: Diagnosis not present

## 2024-03-10 DIAGNOSIS — Z7902 Long term (current) use of antithrombotics/antiplatelets: Secondary | ICD-10-CM | POA: Insufficient documentation

## 2024-03-10 DIAGNOSIS — N1831 Chronic kidney disease, stage 3a: Secondary | ICD-10-CM | POA: Insufficient documentation

## 2024-03-10 DIAGNOSIS — E1122 Type 2 diabetes mellitus with diabetic chronic kidney disease: Secondary | ICD-10-CM | POA: Insufficient documentation

## 2024-03-10 DIAGNOSIS — Z9841 Cataract extraction status, right eye: Secondary | ICD-10-CM | POA: Insufficient documentation

## 2024-03-10 DIAGNOSIS — Z8674 Personal history of sudden cardiac arrest: Secondary | ICD-10-CM | POA: Insufficient documentation

## 2024-03-10 DIAGNOSIS — D509 Iron deficiency anemia, unspecified: Secondary | ICD-10-CM | POA: Diagnosis not present

## 2024-03-10 DIAGNOSIS — Z9842 Cataract extraction status, left eye: Secondary | ICD-10-CM | POA: Insufficient documentation

## 2024-03-10 DIAGNOSIS — Z833 Family history of diabetes mellitus: Secondary | ICD-10-CM | POA: Insufficient documentation

## 2024-03-10 DIAGNOSIS — D631 Anemia in chronic kidney disease: Secondary | ICD-10-CM | POA: Diagnosis present

## 2024-03-10 DIAGNOSIS — E11621 Type 2 diabetes mellitus with foot ulcer: Secondary | ICD-10-CM | POA: Diagnosis not present

## 2024-03-10 DIAGNOSIS — E785 Hyperlipidemia, unspecified: Secondary | ICD-10-CM | POA: Diagnosis not present

## 2024-03-10 DIAGNOSIS — Z89511 Acquired absence of right leg below knee: Secondary | ICD-10-CM | POA: Diagnosis not present

## 2024-03-10 DIAGNOSIS — I2510 Atherosclerotic heart disease of native coronary artery without angina pectoris: Secondary | ICD-10-CM | POA: Insufficient documentation

## 2024-03-10 DIAGNOSIS — I130 Hypertensive heart and chronic kidney disease with heart failure and stage 1 through stage 4 chronic kidney disease, or unspecified chronic kidney disease: Secondary | ICD-10-CM | POA: Diagnosis not present

## 2024-03-10 DIAGNOSIS — F32A Depression, unspecified: Secondary | ICD-10-CM | POA: Insufficient documentation

## 2024-03-10 DIAGNOSIS — J449 Chronic obstructive pulmonary disease, unspecified: Secondary | ICD-10-CM | POA: Diagnosis not present

## 2024-03-10 DIAGNOSIS — Z79899 Other long term (current) drug therapy: Secondary | ICD-10-CM | POA: Diagnosis not present

## 2024-03-10 DIAGNOSIS — D649 Anemia, unspecified: Secondary | ICD-10-CM

## 2024-03-10 LAB — CBC WITH DIFFERENTIAL (CANCER CENTER ONLY)
Abs Immature Granulocytes: 0.03 K/uL (ref 0.00–0.07)
Basophils Absolute: 0 K/uL (ref 0.0–0.1)
Basophils Relative: 0 %
Eosinophils Absolute: 0.2 K/uL (ref 0.0–0.5)
Eosinophils Relative: 3 %
HCT: 37.4 % — ABNORMAL LOW (ref 39.0–52.0)
Hemoglobin: 12 g/dL — ABNORMAL LOW (ref 13.0–17.0)
Immature Granulocytes: 0 %
Lymphocytes Relative: 24 %
Lymphs Abs: 1.9 K/uL (ref 0.7–4.0)
MCH: 28.3 pg (ref 26.0–34.0)
MCHC: 32.1 g/dL (ref 30.0–36.0)
MCV: 88.2 fL (ref 80.0–100.0)
Monocytes Absolute: 1 K/uL (ref 0.1–1.0)
Monocytes Relative: 13 %
Neutro Abs: 4.7 K/uL (ref 1.7–7.7)
Neutrophils Relative %: 60 %
Platelet Count: 179 K/uL (ref 150–400)
RBC: 4.24 MIL/uL (ref 4.22–5.81)
RDW: 13.1 % (ref 11.5–15.5)
WBC Count: 7.9 K/uL (ref 4.0–10.5)
nRBC: 0 % (ref 0.0–0.2)

## 2024-03-10 LAB — IRON AND TIBC
Iron: 40 ug/dL — ABNORMAL LOW (ref 45–182)
Saturation Ratios: 14 % — ABNORMAL LOW (ref 17.9–39.5)
TIBC: 283 ug/dL (ref 250–450)
UIBC: 243 ug/dL

## 2024-03-10 LAB — FERRITIN: Ferritin: 214 ng/mL (ref 24–336)

## 2024-03-10 NOTE — Addendum Note (Signed)
 Addended by: Novalynn Branaman on: 03/10/2024 03:26 PM   Modules accepted: Orders

## 2024-03-10 NOTE — Progress Notes (Signed)
 Hematology/Oncology Consult note [facility]  [phone] [phone]  Patient Care Team: Amin, Saad, MD as PCP - General (Internal Medicine) Perla Evalene PARAS, MD as PCP - Cardiology (Cardiology) Melanee Annah BROCKS, MD as Consulting Physician (Oncology)   Name of the patient: Miguel Harvey  [id number]  1945-07-11   Date of visit: 03/10/2024  Diagnosis- normocytic anemia likely secondary to chronic disease as well as component of iron deficiency   Chief complaint/ Reason for visit- routine f/u of anemia  Heme/Onc history: Patient is a 78 year old male with a past medical history significant for hypertension hyperlipidemia type 2 diabetes COPD CAD stage III CKD, right BKA tobacco abuse who is a resident of nursing home and has been referred to me for anemia.  He was discharged from the hospital in February 2025 for septic arthritis and chronic left foot ulcer requiring antibiotics for 3 weeks.  At baseline patient's hemoglobin is about 10 and it drifted down to 8.8 during hospitalization.  In late March 2025 was noted to have a hemoglobin of 7.2.   Labs from April 2025 were suggestive of relative iron deficiency given that iron saturation was low at 15% although ferritin levels were normal at 225.  Patient received IV iron    Interval history-  Miguel Harvey is a 78 year old male with anemia who presents for routine hematology follow-up to monitor hemoglobin and iron status.  He currently feels well and denies new symptoms. He reports no fatigue, weakness, weight loss, fevers, chest pain, dyspnea, or palpitations.  He resides in an independent living facility, performs all activities of daily living independently, and ambulates primarily with a wheelchair, occasionally using a crutch.  He continues to manage a chronic septic spot without acute issues.  ECOG PS- 2 Pain scale- 0   Review of systems- Review of Systems  Constitutional:   Negative for chills, fever, malaise/fatigue and weight loss.  HENT:  Negative for congestion, ear discharge and nosebleeds.   Eyes:  Negative for blurred vision.  Respiratory:  Negative for cough, hemoptysis, sputum production, shortness of breath and wheezing.   Cardiovascular:  Negative for chest pain, palpitations, orthopnea and claudication.  Gastrointestinal:  Negative for abdominal pain, blood in stool, constipation, diarrhea, heartburn, melena, nausea and vomiting.  Genitourinary:  Negative for dysuria, flank pain, frequency, hematuria and urgency.  Musculoskeletal:  Negative for back pain, joint pain and myalgias.  Skin:  Negative for rash.  Neurological:  Negative for dizziness, tingling, focal weakness, seizures, weakness and headaches.  Endo/Heme/Allergies:  Does not bruise/bleed easily.  Psychiatric/Behavioral:  Negative for depression and suicidal ideas. The patient does not have insomnia.       Allergies[1]   Past Medical History:  Diagnosis Date   Abnormal posture    CHF (congestive heart failure) (HCC)    Chronic kidney disease    CKD   stage 2   Complication of anesthesia    heart stopped x2 during toe amputation   COPD (chronic obstructive pulmonary disease) (HCC)    Coronary artery disease, non-occlusive    a. LHC 05/2011: 10-20% ostial LAD stenosis   Depression    Diabetes mellitus    Grade I diastolic dysfunction    HLD (hyperlipidemia)    Hypertension    Muscle weakness    Neuromuscular disorder (HCC)    neuropathy   NICM (nonischemic cardiomyopathy) (HCC)    a. TTE 2/213: EF 50-55%, mild LVH, normal wall motion, Gr2DD, mildly dilated LA; b. TTE  2/13: EF 40-45%, mild concentric LVH, global HK, no evidence of LV thrombus; c. TTE 4/19: EF 40%, diffuse HK, Gr2DD, mild MR, mildly dilated LA, RV cavity size normal w/ normal RVSF, PASP 63; d. TTE 4/19: EF 25-30%, diffuse HK, mild LVH, mild MR, mild PASP; e. TTE 5/19: EF 20-25%, diffuse HK, moderate MR,   NSVT  (nonsustained ventricular tachycardia) (HCC)    PAD (peripheral artery disease)    a. s/p right BKA s/p  recent balloon angioplasty fo the left peroneal artery, left posterior tibial artery, tibioperoneal trunk, and stenting to the distal left SFA in 07/2017   PEA (Pulseless electrical activity) (HCC)    heart stopped beating during surgery for amputation of toes     Past Surgical History:  Procedure Laterality Date   AMPUTATION Right 07/02/2017   Procedure: RIGHT BELOW KNEE AMPUTATION;  Surgeon: Harden Jerona GAILS, MD;  Location: [facility];  Service: Orthopedics;  Laterality: Right;   AMPUTATION TOE Left 05/11/2021   Procedure: LEFT GREAT  TOE AMPUTATION;  Surgeon: Ashley Soulier, DPM;  Location: [facility] ORS;  Service: Podiatry;  Laterality: Left;   CARDIAC CATHETERIZATION     CATARACT EXTRACTION W/PHACO Left 11/08/2018   Procedure: CATARACT EXTRACTION PHACO AND INTRAOCULAR LENS PLACEMENT (IOC)  LEFT VISION BLUE;  Surgeon: Ferol Rogue, MD;  Location: [facility] ORS;  Service: Ophthalmology;  Laterality: Left;  US  03:38.9 CDE 42.37 Fluid pack lot # [id number]   CATARACT EXTRACTION W/PHACO Right 03/25/2023   Procedure: CATARACT EXTRACTION PHACO AND INTRAOCULAR LENS PLACEMENT (IOC) RIGHT DIABETIC MALYUGIN OMIDRIA  24.82 02:08.6;  Surgeon: Enola Feliciano Hugger, MD;  Location: [facility];  Service: Ophthalmology;  Laterality: Right;   cyst removal from  right leg about 6 years ago     IRRIGATION AND DEBRIDEMENT FOOT Left 08/10/2017   Procedure: IRRIGATION AND DEBRIDEMENT FOOT;  Surgeon: Ashley Soulier, DPM;  Location: [facility] ORS;  Service: Podiatry;  Laterality: Left;   IRRIGATION AND DEBRIDEMENT FOOT Left 05/14/2023   Procedure: IRRIGATION AND DEBRIDEMENT FOOT;  Surgeon: Ashley Soulier, DPM;  Location: [facility] ORS;  Service: Orthopedics/Podiatry;  Laterality: Left;   KNEE ARTHROSCOPY Left 05/11/2023   Procedure: ARTHROSCOPIC IRRIGATION AND DEBRIDEMENT LEFT KNEE, SYNOVECTOMY;  Surgeon: Lorelle Hussar, MD;   Location: [facility] ORS;  Service: Orthopedics;  Laterality: Left;   LEFT HEART CATHETERIZATION WITH CORONARY ANGIOGRAM N/A 05/11/2011   Procedure: LEFT HEART CATHETERIZATION WITH CORONARY ANGIOGRAM;  Surgeon: Debby DELENA Sor, MD;  Location: [facility] CATH LAB;  Service: Cardiovascular;  Laterality: N/A;   LOWER EXTREMITY ANGIOGRAPHY Left 08/13/2017   Procedure: Lower Extremity Angiography and PICC line placement;  Surgeon: Marea Selinda RAMAN, MD;  Location: [facility] INVASIVE CV LAB;  Service: Cardiovascular;  Laterality: Left;   ruptured navel     repair of   TOE AMPUTATION     not sure of when this surgery occured, but his heart stopped beating x 2 during surgery    Social History   Socioeconomic History   Marital status: Single    Spouse name: danielle .... daughter   Number of children: Not on file   Years of education: Not on file   Highest education level: Not on file  Occupational History   Not on file  Tobacco Use   Smoking status: Every Day    Current packs/day: 0.00    Types: Cigarettes   Smokeless tobacco: Former    Quit date: 05/07/2011   Tobacco comments:    3 cigs per day (previous hx of smoking 1 PPD)  Vaping  Use   Vaping status: Never Used  Substance and Sexual Activity   Alcohol  use: Not Currently    Alcohol /week: 6.0 standard drinks of alcohol     Types: 6 Cans of beer per week   Drug use: Never   Sexual activity: Not Currently  Other Topics Concern   Not on file  Social History Narrative   Patient resides at [facility]   Social Drivers of Health   Tobacco Use: High Risk (03/10/2024)   Patient History    Smoking Tobacco Use: Every Day    Smokeless Tobacco Use: Former    Passive Exposure: Not on Actuary Strain: Patient Declined (06/08/2023)   Received from [facility]   Overall Financial Resource Strain (CARDIA)    Difficulty of Paying Living Expenses: Patient declined  Food Insecurity: Patient Declined (06/08/2023)   Received  from [facility]   Epic    Within the past 12 months, you worried that your food would run out before you got the money to buy more.: Patient declined    Within the past 12 months, the food you bought just didn't last and you didn't have money to get more.: Patient declined  Transportation Needs: Patient Declined (06/08/2023)   Received from [facility] - Transportation    In the past 12 months, has lack of transportation kept you from medical appointments or from getting medications?: Patient declined    Lack of Transportation (Non-Medical): Patient declined  Physical Activity: Not on file  Stress: Not on file  Social Connections: Socially Isolated (05/11/2023)   Social Connection and Isolation Panel    Frequency of Communication with Friends and Family: Patient declined    Frequency of Social Gatherings with Friends and Family: Three times a week    Attends Religious Services: Never    Active Member of Clubs or Organizations: No    Attends Banker Meetings: Never    Marital Status: Never married  Intimate Partner Violence: Not At Risk (05/11/2023)   Humiliation, Afraid, Rape, and Kick questionnaire    Fear of Current or Ex-Partner: No    Emotionally Abused: No    Physically Abused: No    Sexually Abused: No  Depression (PHQ2-9): Not on file  Alcohol  Screen: Not on file  Housing: Patient Declined (06/08/2023)   Received from [facility]   Epic    In the last 12 months, was there a time when you were not able to pay the mortgage or rent on time?: Patient declined    Number of Times Moved in the Last Year: Not on file    At any time in the past 12 months, were you homeless or living in a shelter (including now)?: Patient declined  Utilities: Patient Declined (06/08/2023)   Received from [facility] Utilities    Threatened with loss of utilities: Patient declined  Health Literacy: Not on  file    Family History  Problem Relation Age of Onset   CAD Other    Diabetes Other     Current Medications[2]  Physical exam:  Vitals:   03/10/24 1455  BP: 129/68  Pulse: 71  Resp: 19  Temp: 98.6 F (37 C)  SpO2: 99%  Weight: 201 lb 11.2 oz (91.5 kg)   Physical Exam Cardiovascular:     Rate and Rhythm: Normal rate and regular rhythm.     Heart sounds: Normal heart  sounds.  Pulmonary:     Effort: Pulmonary effort is normal.     Breath sounds: Normal breath sounds.  Musculoskeletal:     Comments: S/p R right BKA  Skin:    General: Skin is warm and dry.  Neurological:     Mental Status: He is alert and oriented to person, place, and time.      I have personally reviewed labs listed below:    Latest Ref Rng & Units 06/18/2023    3:02 PM  CMP  Glucose 70 - 99 mg/dL 856   BUN 8 - 23 mg/dL 16   Creatinine 9.38 - 1.24 mg/dL 9.20   Sodium 864 - 854 mmol/L 136   Potassium 3.5 - 5.1 mmol/L 3.7   Chloride 98 - 111 mmol/L 104   CO2 22 - 32 mmol/L 23   Calcium  8.9 - 10.3 mg/dL 7.9   Total Protein 6.5 - 8.1 g/dL 7.4   Total Bilirubin 0.0 - 1.2 mg/dL 0.2   Alkaline Phos 38 - 126 U/L 84   AST 15 - 41 U/L 17   ALT 0 - 44 U/L 15       Latest Ref Rng & Units 03/10/2024    2:40 PM  CBC  WBC 4.0 - 10.5 K/uL 7.9   Hemoglobin 13.0 - 17.0 g/dL 87.9   Hematocrit 60.9 - 52.0 % 37.4   Platelets 150 - 400 K/uL 179      Assessment and plan- Patient is a 78 y.o. male here for routine follow-up of iron deficiency anemia  Assessment and Plan    Iron deficiency anemia -Patient last received IV iron in April 2025 Anemia well-managed with hemoglobin at 12 g/dL. Iron supplementation not indicated. Awaiting today's iron studies. - Repeat blood work in four months to monitor hemoglobin and iron indices. - Scheduled follow-up visit in eight months.         Visit Diagnosis 1. Iron deficiency anemia, unspecified iron deficiency anemia type      Dr. Annah Skene, MD,  [facility] at [facility] [phone] 03/10/2024 3:12 PM                   [1]  Allergies Allergen Reactions   Spironolactone      Hyperkalemia (currently takes spironolactone )  [2]  Current Outpatient Medications:    aspirin  EC 81 MG EC tablet, Take 1 tablet (81 mg total) by mouth daily. (Patient taking differently: Take 81 mg by mouth daily. D/c'd on 05/11/23 at 1858 per [facility]), Disp: 30 tablet, Rfl: 0   atorvastatin  (LIPITOR) 20 MG tablet, Take 20 mg by mouth every evening., Disp: , Rfl:    carboxymethylcellulose (REFRESH PLUS) 0.5 % SOLN, Place 1 drop into both eyes 4 (four) times daily., Disp: , Rfl:    carvedilol  (COREG ) 6.25 MG tablet, Take 6.25 mg by mouth 2 (two) times daily with a meal., Disp: , Rfl:    clopidogrel  (PLAVIX ) 75 MG tablet, Take 1 tablet (75 mg total) by mouth daily., Disp: 30 tablet, Rfl: 0   Continuous Glucose Receiver (DEXCOM G7 RECEIVER) DEVI, 1 Device by Does not apply route continuous., Disp: 1 each, Rfl: 0   Continuous Glucose Sensor (DEXCOM G7 SENSOR) MISC, 1 Device by Does not apply route continuous., Disp: 9 each, Rfl: 0   Cyanocobalamin  (B-12 PO), Take 500 mcg by mouth daily at 2 am., Disp: , Rfl:    folic acid  (FOLVITE ) 1 MG tablet, Take 1 tablet (1 mg total) by  mouth daily., Disp: 30 tablet, Rfl: 1   furosemide  (LASIX ) 20 MG tablet, Take 20 mg by mouth daily. D/c'd on 05/11/23 per [facility], Disp: , Rfl:    guaifenesin  (ROBITUSSIN) 100 MG/5ML syrup, Take 10 mLs by mouth every 8 (eight) hours as needed for cough., Disp: , Rfl:    ipratropium-albuterol  (DUONEB) 0.5-2.5 (3) MG/3ML SOLN, Take 3 mLs by nebulization every 6 (six) hours as needed., Disp: , Rfl:    latanoprost  (XALATAN ) 0.005 % ophthalmic solution, Place 1 drop into both eyes at bedtime., Disp: , Rfl:    linagliptin  (TRADJENTA ) 5 MG TABS tablet, Take 5 mg by mouth daily., Disp: , Rfl:    losartan (COZAAR) 25 MG tablet, Take 25 mg by mouth daily., Disp: , Rfl:    LYUMJEV  KWIKPEN 100 UNIT/ML KwikPen, , Disp: , Rfl:    Magnesium  400 MG TABS, Take 400 mg by mouth 2 (two) times daily. , Disp: , Rfl:    Multiple Vitamin (MULTIVITAMIN WITH MINERALS) TABS tablet, Take 1 tablet by mouth daily., Disp: , Rfl:    oxyCODONE  (OXY IR/ROXICODONE ) 5 MG immediate release tablet, Take 5 mg by mouth every 4 (four) hours as needed for severe pain (pain score 7-10)., Disp: , Rfl:    sertraline  (ZOLOFT ) 50 MG tablet, Take 50 mg by mouth daily., Disp: , Rfl:    sodium chloride  1 g tablet, Take 1 tablet (1 g total) by mouth 2 (two) times daily with a meal., Disp: 60 tablet, Rfl: 0   HYDROcodone -acetaminophen  (NORCO) 7.5-325 MG tablet, Take 1 tablet by mouth every 6 (six) hours as needed for severe pain (pain score 7-10). (Patient not taking: Reported on 03/10/2024), Disp: 10 tablet, Rfl: 0   ibuprofen (ADVIL) 600 MG tablet, Take 600 mg by mouth every 6 (six) hours as needed. (Patient not taking: Reported on 03/10/2024), Disp: , Rfl:    insulin  glargine, 2 Unit Dial, (TOUJEO  MAX SOLOSTAR) 300 UNIT/ML Solostar Pen, Inject 20 Units into the skin daily at 6 (six) AM. (Patient not taking: Reported on 03/10/2024), Disp: , Rfl:    Insulin  Lispro-aabc (LYUMJEV) 100 UNIT/ML SOLN, Inject 8 Units as directed daily at 6 (six) AM. BASED ON SLIDING SCALE (Patient not taking: Reported on 03/10/2024), Disp: , Rfl:    traZODone  (DESYREL ) 50 MG tablet, Take 0.5 mg by mouth at bedtime. (Patient not taking: Reported on 03/10/2024), Disp: , Rfl:

## 2024-07-10 ENCOUNTER — Inpatient Hospital Stay

## 2024-11-08 ENCOUNTER — Inpatient Hospital Stay

## 2024-11-08 ENCOUNTER — Inpatient Hospital Stay: Admitting: Oncology
